# Patient Record
Sex: Male | Born: 1935 | Race: White | Hispanic: No | Marital: Married | Smoking: Former smoker
Health system: Southern US, Community
[De-identification: ages and names within clinical notes are randomized; demographics above are authoritative.]

## PROBLEM LIST (undated history)

## (undated) DIAGNOSIS — Z87898 Personal history of other specified conditions: Secondary | ICD-10-CM

## (undated) DIAGNOSIS — Z972 Presence of dental prosthetic device (complete) (partial): Secondary | ICD-10-CM

## (undated) DIAGNOSIS — K219 Gastro-esophageal reflux disease without esophagitis: Secondary | ICD-10-CM

## (undated) DIAGNOSIS — I219 Acute myocardial infarction, unspecified: Secondary | ICD-10-CM

## (undated) DIAGNOSIS — E039 Hypothyroidism, unspecified: Secondary | ICD-10-CM

## (undated) DIAGNOSIS — F329 Major depressive disorder, single episode, unspecified: Secondary | ICD-10-CM

## (undated) DIAGNOSIS — R0602 Shortness of breath: Secondary | ICD-10-CM

## (undated) DIAGNOSIS — M1990 Unspecified osteoarthritis, unspecified site: Secondary | ICD-10-CM

## (undated) DIAGNOSIS — I255 Ischemic cardiomyopathy: Secondary | ICD-10-CM

## (undated) DIAGNOSIS — Z8719 Personal history of other diseases of the digestive system: Secondary | ICD-10-CM

## (undated) DIAGNOSIS — F419 Anxiety disorder, unspecified: Secondary | ICD-10-CM

## (undated) DIAGNOSIS — I2510 Atherosclerotic heart disease of native coronary artery without angina pectoris: Secondary | ICD-10-CM

## (undated) DIAGNOSIS — G4730 Sleep apnea, unspecified: Secondary | ICD-10-CM

## (undated) DIAGNOSIS — E7800 Pure hypercholesterolemia, unspecified: Secondary | ICD-10-CM

## (undated) DIAGNOSIS — D693 Immune thrombocytopenic purpura: Secondary | ICD-10-CM

## (undated) DIAGNOSIS — F32A Depression, unspecified: Secondary | ICD-10-CM

## (undated) DIAGNOSIS — Z974 Presence of external hearing-aid: Secondary | ICD-10-CM

## (undated) DIAGNOSIS — I350 Nonrheumatic aortic (valve) stenosis: Secondary | ICD-10-CM

## (undated) DIAGNOSIS — I10 Essential (primary) hypertension: Secondary | ICD-10-CM

## (undated) DIAGNOSIS — M069 Rheumatoid arthritis, unspecified: Secondary | ICD-10-CM

## (undated) DIAGNOSIS — J189 Pneumonia, unspecified organism: Secondary | ICD-10-CM

## (undated) DIAGNOSIS — Z973 Presence of spectacles and contact lenses: Secondary | ICD-10-CM

## (undated) HISTORY — PX: STERNAL CLOSURE: SHX6203

## (undated) HISTORY — PX: HERNIA REPAIR: SHX51

## (undated) HISTORY — PX: LUMBAR DISC SURGERY: SHX700

## (undated) HISTORY — PX: COLONOSCOPY: SHX174

## (undated) HISTORY — PX: CATARACT EXTRACTION, BILATERAL: SHX1313

## (undated) HISTORY — PX: CORONARY ANGIOPLASTY WITH STENT PLACEMENT: SHX49

## (undated) HISTORY — PX: APPENDECTOMY: SHX54

## (undated) HISTORY — PX: BACK SURGERY: SHX140

## (undated) HISTORY — PX: TOTAL HIP ARTHROPLASTY: SHX124

## (undated) HISTORY — PX: HIATAL HERNIA REPAIR: SHX195

## (undated) HISTORY — PX: MULTIPLE TOOTH EXTRACTIONS: SHX2053

---

## 2007-09-07 ENCOUNTER — Encounter: Payer: Self-pay | Admitting: Orthopedic Surgery

## 2007-11-15 ENCOUNTER — Ambulatory Visit: Payer: Self-pay | Admitting: Orthopedic Surgery

## 2007-11-15 DIAGNOSIS — M479 Spondylosis, unspecified: Secondary | ICD-10-CM | POA: Insufficient documentation

## 2007-11-15 DIAGNOSIS — M25559 Pain in unspecified hip: Secondary | ICD-10-CM | POA: Insufficient documentation

## 2007-11-17 ENCOUNTER — Ambulatory Visit (HOSPITAL_COMMUNITY): Admission: RE | Admit: 2007-11-17 | Discharge: 2007-11-17 | Payer: Self-pay | Admitting: Orthopedic Surgery

## 2007-11-25 ENCOUNTER — Telehealth: Payer: Self-pay | Admitting: Orthopedic Surgery

## 2007-12-02 ENCOUNTER — Encounter: Payer: Self-pay | Admitting: Orthopedic Surgery

## 2008-09-05 ENCOUNTER — Ambulatory Visit (HOSPITAL_COMMUNITY): Admission: RE | Admit: 2008-09-05 | Discharge: 2008-09-05 | Payer: Self-pay | Admitting: Surgery

## 2009-09-02 ENCOUNTER — Emergency Department (HOSPITAL_COMMUNITY): Admission: EM | Admit: 2009-09-02 | Discharge: 2009-09-02 | Payer: Self-pay | Admitting: Emergency Medicine

## 2009-09-03 ENCOUNTER — Emergency Department (HOSPITAL_COMMUNITY): Admission: EM | Admit: 2009-09-03 | Discharge: 2009-09-03 | Payer: Self-pay | Admitting: Emergency Medicine

## 2009-12-22 DIAGNOSIS — I219 Acute myocardial infarction, unspecified: Secondary | ICD-10-CM

## 2009-12-22 HISTORY — PX: CORONARY ARTERY BYPASS GRAFT: SHX141

## 2009-12-22 HISTORY — DX: Acute myocardial infarction, unspecified: I21.9

## 2011-03-28 LAB — CBC
HCT: 39.8 % (ref 39.0–52.0)
MCHC: 34.7 g/dL (ref 30.0–36.0)
MCV: 91.6 fL (ref 78.0–100.0)
Platelets: 230 10*3/uL (ref 150–400)
RBC: 4.34 MIL/uL (ref 4.22–5.81)
RDW: 13 % (ref 11.5–15.5)

## 2011-03-28 LAB — DIFFERENTIAL
Basophils Relative: 0 % (ref 0–1)
Eosinophils Absolute: 0.2 10*3/uL (ref 0.0–0.7)
Eosinophils Relative: 1 % (ref 0–5)
Lymphocytes Relative: 22 % (ref 12–46)
Monocytes Absolute: 1.6 10*3/uL — ABNORMAL HIGH (ref 0.1–1.0)
Monocytes Relative: 14 % — ABNORMAL HIGH (ref 3–12)
Neutrophils Relative %: 63 % (ref 43–77)

## 2011-03-28 LAB — BASIC METABOLIC PANEL
BUN: 13 mg/dL (ref 6–23)
Calcium: 9.2 mg/dL (ref 8.4–10.5)
Creatinine, Ser: 0.94 mg/dL (ref 0.4–1.5)
GFR calc Af Amer: >60 mL/min (ref >60)

## 2011-09-22 LAB — CREATININE, SERUM: Creatinine, Ser: 0.95

## 2012-09-16 ENCOUNTER — Emergency Department (HOSPITAL_COMMUNITY): Payer: Medicare Other

## 2012-09-16 ENCOUNTER — Emergency Department (HOSPITAL_COMMUNITY)
Admission: EM | Admit: 2012-09-16 | Discharge: 2012-09-16 | Disposition: A | Discharge status: Home / Self Care | Payer: Medicare Other | Attending: Emergency Medicine | Admitting: Emergency Medicine

## 2012-09-16 ENCOUNTER — Encounter (HOSPITAL_COMMUNITY): Payer: Self-pay

## 2012-09-16 DIAGNOSIS — E079 Disorder of thyroid, unspecified: Secondary | ICD-10-CM | POA: Insufficient documentation

## 2012-09-16 DIAGNOSIS — IMO0002 Reserved for concepts with insufficient information to code with codable children: Secondary | ICD-10-CM | POA: Insufficient documentation

## 2012-09-16 DIAGNOSIS — M1710 Unilateral primary osteoarthritis, unspecified knee: Secondary | ICD-10-CM | POA: Insufficient documentation

## 2012-09-16 DIAGNOSIS — Z87891 Personal history of nicotine dependence: Secondary | ICD-10-CM | POA: Insufficient documentation

## 2012-09-16 DIAGNOSIS — Z9861 Coronary angioplasty status: Secondary | ICD-10-CM | POA: Insufficient documentation

## 2012-09-16 DIAGNOSIS — Z951 Presence of aortocoronary bypass graft: Secondary | ICD-10-CM | POA: Insufficient documentation

## 2012-09-16 DIAGNOSIS — K219 Gastro-esophageal reflux disease without esophagitis: Secondary | ICD-10-CM | POA: Insufficient documentation

## 2012-09-16 DIAGNOSIS — Z23 Encounter for immunization: Secondary | ICD-10-CM | POA: Insufficient documentation

## 2012-09-16 DIAGNOSIS — E7800 Pure hypercholesterolemia, unspecified: Secondary | ICD-10-CM | POA: Insufficient documentation

## 2012-09-16 DIAGNOSIS — F411 Generalized anxiety disorder: Secondary | ICD-10-CM | POA: Insufficient documentation

## 2012-09-16 DIAGNOSIS — S80219A Abrasion, unspecified knee, initial encounter: Secondary | ICD-10-CM

## 2012-09-16 DIAGNOSIS — I10 Essential (primary) hypertension: Secondary | ICD-10-CM | POA: Insufficient documentation

## 2012-09-16 HISTORY — DX: Pure hypercholesterolemia, unspecified: E78.00

## 2012-09-16 HISTORY — DX: Essential (primary) hypertension: I10

## 2012-09-16 HISTORY — DX: Anxiety disorder, unspecified: F41.9

## 2012-09-16 HISTORY — DX: Gastro-esophageal reflux disease without esophagitis: K21.9

## 2012-09-16 MED ORDER — TETANUS-DIPHTH-ACELL PERTUSSIS 5-2.5-18.5 LF-MCG/0.5 IM SUSP
0.5000 mL | Freq: Once | INTRAMUSCULAR | Status: AC
  Administered 2012-09-16: 0.5 mL via INTRAMUSCULAR
  Filled 2012-09-16: qty 0.5

## 2012-09-16 NOTE — ED Notes (Signed)
Hit my left knee with a chain saw per pt. Happened around 3 pm and I thought I had it under control but my wife and son thought I didn't do to good of a job per pt.

## 2012-09-16 NOTE — ED Notes (Signed)
MD at bedside. 

## 2012-09-16 NOTE — ED Notes (Signed)
Discharge instructions reviewed with pt, questions answered. Pt verbalized understanding.  

## 2012-09-16 NOTE — ED Provider Notes (Signed)
History   This chart was scribed for Flint Melter, MD by Albertha Ghee Rifaie. This patient was seen in room APA06/APA06 and the patient's care was started at 21:48.   CSN: [id number]  Arrival date & time 09/16/12  1914   First MD Initiated Contact with Patient 09/16/12 2148      Chief Complaint  Patient presents with  . Extremity Laceration  . Knee Injury    The history is provided by the patient and the spouse. No language interpreter was used.    CASIUS MELCHIOR is a 78 y.o. male who presents to the Emergency Department complaining of 7 hr of left knee injury after he hit it, blood is controled. Pt was able to ambulate after the accident and came to the ED because his wife and son were worried. Pt denies having fever ,chills, emesis, and nausea. Pt denies smoking and alcohol use.    Past Medical History  Diagnosis Date  . High cholesterol   . Thyroid disease   . GERD (gastroesophageal reflux disease)   . Hypertension   . Anxiety   . History of heart artery stent     Past Surgical History  Procedure Date  . Coronary artery bypass graft     History reviewed. No pertinent family history.  History  Substance Use Topics  . Smoking status: Former Games developer  . Smokeless tobacco: Not on file  . Alcohol Use: No      Review of Systems  All other systems reviewed and are negative.    Allergies  Lipitor and Methylprednisolone  Home Medications   Current Outpatient Rx  Name Route Sig Dispense Refill  . ALPRAZOLAM 0.5 MG PO TABS Oral Take 0.5 mg by mouth at bedtime as needed.    . ASPIRIN 81 MG PO CHEW Oral Chew 81 mg by mouth daily.    [name] Kitchen CLOPIDOGREL BISULFATE 75 MG PO TABS Oral Take 75 mg by mouth daily.    [name] Kitchen DICLOFENAC SODIUM 50 MG PO TBEC Oral Take 100 mg by mouth 2 (two) times daily.    [name] Kitchen FLUOXETINE HCL 10 MG PO CAPS Oral Take 10 mg by mouth daily.    [name] Kitchen FLUTICASONE PROPIONATE  HFA 110 MCG/ACT IN AERO Inhalation Inhale 1 puff into the lungs 2 (two) times daily.     [name] Kitchen FLUTICASONE-SALMETEROL 230-21 MCG/ACT IN AERO Inhalation Inhale 2 puffs into the lungs 2 (two) times daily.    [name] Kitchen LEVOTHYROXINE SODIUM 200 MCG PO TABS Oral Take 200 mcg by mouth daily.    [name] Kitchen LEVOTHYROXINE SODIUM 25 MCG PO TABS Oral Take 25 mcg by mouth daily.    [name] Kitchen METOPROLOL SUCCINATE ER 25 MG PO TB24 Oral Take 25 mg by mouth daily.    [name] Kitchen OMEPRAZOLE 40 MG PO CPDR Oral Take 40 mg by mouth daily.    [name] Kitchen PRAVASTATIN SODIUM 20 MG PO TABS Oral Take 20 mg by mouth daily.      BP 146/73  Pulse 66  Temp 98.3 F (36.8 C) (Oral)  Resp 20  Ht 6\' 2"  (1.88 m)  Wt 235 lb (106.595 kg)  BMI 30.17 kg/m2  SpO2 96%  Physical Exam  Nursing note and vitals reviewed. Constitutional: He is oriented to person, place, and time. He appears well-developed and well-nourished. No distress.  HENT:  Head: Normocephalic and atraumatic.  Eyes: Conjunctivae normal and EOM are normal.  Neck: Neck supple. No tracheal deviation present.  Cardiovascular: Normal rate.   Pulmonary/Chest: Effort normal. No respiratory distress.  Abdominal: He exhibits no distension.  Musculoskeletal:       Abrasion in left anterior knee, not bleeding Left knee diffusion and limited ROM  Left knee arthritis   Neurological: He is alert and oriented to person, place, and time. No sensory deficit.  Skin: Skin is dry.  Psychiatric: He has a normal mood and affect. His behavior is normal.    ED Course  Procedures (including critical care time)  DIAGNOSTIC STUDIES: Oxygen Saturation is 96% on room air, adequate by my interpretation.    COORDINATION OF CARE: 9:48 PM Discussed treatment plan with pt at bedside and pt agreed to plan. 10:12 PM wound was cleaned and no bleeding, bandage was applied and discharge plans were discussed with pt at bedside and pt agreed to plan.   Labs Reviewed - No data to display Dg Knee Complete 4 Views Left  09/16/2012  *RADIOLOGY REPORT*  Clinical Data: 78 year old male chainsaw injury to the knee.   Pain.  LEFT KNEE - COMPLETE 4+ VIEW  Comparison: None.  Findings: The soft tissue injury is not clearly delineated.  There is a small suprapatellar joint effusion.  No definite gas within the joint space.  The patella appears intact.  Medial compartment joint space loss and mild tricompartmental degenerative changes. No acute fracture identified.  Small surgical clips posterior to the knee and calcified atherosclerosis in the left lower extremity.  IMPRESSION: 1.  Joint effusion.  No definite gas within the joint space. 2.  No acute fracture identified.   Original Report Authenticated By: Ulla Potash III, M.D.      1. Abrasion of knee       MDM  Minor knee abrasion. Unassociated chronic left knee pain/swelling is likely d/t DJD. No indication for further ED treatment.     I personally performed the services described in this documentation, which was scribed in my presence. The recorded information has been reviewed and considered.    Plan: Home Medications- usual; Home Treatments- wounc care; Recommended follow up- PCP, prn   Flint Melter, MD 09/18/12 1223

## 2012-10-11 ENCOUNTER — Ambulatory Visit (INDEPENDENT_AMBULATORY_CARE_PROVIDER_SITE_OTHER): Payer: Medicare Other | Admitting: Orthopedic Surgery

## 2012-10-11 ENCOUNTER — Encounter: Payer: Self-pay | Admitting: Orthopedic Surgery

## 2012-10-11 VITALS — BP 102/60 | Ht 74.0 in | Wt 235.0 lb

## 2012-10-11 DIAGNOSIS — M25462 Effusion, left knee: Secondary | ICD-10-CM | POA: Insufficient documentation

## 2012-10-11 DIAGNOSIS — M179 Osteoarthritis of knee, unspecified: Secondary | ICD-10-CM

## 2012-10-11 DIAGNOSIS — M1710 Unilateral primary osteoarthritis, unspecified knee: Secondary | ICD-10-CM

## 2012-10-11 DIAGNOSIS — M25469 Effusion, unspecified knee: Secondary | ICD-10-CM

## 2012-10-11 NOTE — Progress Notes (Signed)
Patient ID: BRIGHT SPIELMANN, male   DOB: Nov 28, 1934, 78 y.o.   MRN: [id number] Chief Complaint  Patient presents with  . Knee Injury    left knee injury, DOI 09/16/12     78 year old male who 2 month history of gradual onset of throbbing 7/10 intermittent pain which is worse when he moves and straightens his knee especially with flexion. He is experiencing catching and medial and lateral joint pain  No trauma no previous treatment other than over-the-counter medication  Review of systems watering of the eyes. Shortness of breath and cough snoring easy bruising.  Remaining review of systems negative    BP 102/60  Ht 6\' 2"  (1.88 m)  Wt 235 lb (106.595 kg)  BMI 30.17 kg/m2  Vital signs are stable as recorded  General appearance is normal  The patient is alert and oriented x3  The patient's mood and affect are normal  Gait assessment: No assistive devices needed for ambulation The cardiovascular exam reveals normal pulses and temperature without edema swelling.  The lymphatic system is negative for palpable lymph nodes  The sensory exam is normal.  There are no pathologic reflexes.  Balance is normal.   Exam of the upper extremities normal Inspection left lower [place]ley tenderness over the lateral femoral condyle and medial joint line Range of motion he can flex the knee 125 Stability the knee is stable Strength strength is normal Skin skin is intact, laceration now closed with scab  X-rays were done at the hospital for a chain saw injury which was superficial, that x-rays show severe medial joint space narrowing subchondral sclerosis mild varus alignment to the knee  Joint effusion  Osteoarthritis of the left knee  Recommend aspiration injection  Aspiration, we obtained 70 cc of clear yellow fluid  Followup 2 weeks  Knee  Injection and aspiration Procedure Note  Pre-operative Diagnosis: left knee oa, effusion  Post-operative Diagnosis: same  Indications:  pain, swelling  Anesthesia: ethyl chloride   Procedure Details   Verbal consent was obtained for the procedure. Time out was completed.The joint was prepped with alcohol, followed by  Ethyl chloride spray and The 18-gauge needle was inserted into the joint via lateral approach and we aspirated approximately 75 cc of clear yellow fluid  This was followed by the injection of 4ml 1% lidocaine and 1 ml of depomedrol  was then injected into the joint . The needle was removed and the area cleansed and dressed.  Complications:  None; patient tolerated the procedure well.

## 2012-10-11 NOTE — Patient Instructions (Addendum)
You have received a steroid shot. 15% of patients experience increased pain at the injection site with in the next 24 hours. This is best treated with ice and tylenol extra strength 2 tabs every 8 hours. If you are still having pain please call the office.   Joint Injection Care After Refer to this sheet in the next few days. These instructions provide you with information on caring for yourself after you have had a joint injection. Your caregiver also may give you more specific instructions. Your treatment has been planned according to current medical practices, but problems sometimes occur. Call your caregiver if you have any problems or questions after your procedure. After any type of joint injection, it is not uncommon to experience:  Soreness, swelling, or bruising around the injection site.   Mild numbness, tingling, or weakness around the injection site caused by the numbing medicine used before or with the injection.  It also is possible to experience the following effects associated with the specific agent after injection:  Iodine-based contrast agents:   Allergic reaction (itching, hives, widespread redness, and swelling beyond the injection site).   Corticosteroids (These effects are rare.):   Allergic reaction.   Increased blood sugar levels (If you have diabetes and you notice that your blood sugar levels have increased, notify your caregiver).   Increased blood pressure levels.   Mood swings.   Hyaluronic acid in the use of viscosupplementation.   Temporary heat or redness.   Temporary rash and itching.   Increased fluid accumulation in the injected joint.  These effects all should resolve within a day after your procedure.   HOME CARE INSTRUCTIONS  Limit yourself to light activity the day of your procedure. Avoid lifting heavy objects, bending, stooping, or twisting.   Take prescription or over-the-counter pain medication as directed by your caregiver.   You  may apply ice to your injection site to reduce pain and swelling the day of your procedure. Ice may be applied 3 to 4 times:   Put ice in a plastic bag.   Place a towel between your skin and the bag.   Leave the ice on for no longer than 15 to 20 minutes each time.  SEEK IMMEDIATE MEDICAL CARE IF:    Pain and swelling get worse rather than better or extend beyond the injection site.   Numbness does not go away.   Blood or fluid continues to leak from the injection site.   You have chest pain.   You have swelling of your face or tongue.   You have trouble breathing or you become dizzy.   You develop a fever, chills, or severe tenderness at the injection site that last longer than 1 day.  MAKE SURE YOU:  Understand these instructions.   Watch your condition.   Get help right away if you are not doing well or if you get worse.  Document Released: 08/21/2011 Document Revised: 03/01/2012 Document Reviewed: 08/21/2011 [facility] Patient Information 2013 [place], [place].   Degenerative Arthritis You have osteoarthritis. This is the wear and tear arthritis that comes with aging. It is also called degenerative arthritis. This is common in people past middle age. It is caused by stress on the joints. The large weight bearing joints of the lower extremities are most often affected. The knees, hips, back, neck, and hands can become painful, swollen, and stiff. This is the most common type of arthritis. It comes on with age, carrying too much weight, or from an  injury. Treatment includes resting the sore joint until the pain and swelling improve. Crutches or a walker may be needed for severe flares. Only take over-the-counter or prescription medicines for pain, discomfort, or fever as directed by your caregiver. Local heat therapy may improve motion. Cortisone shots into the joint are sometimes used to reduce pain and swelling during flares. Osteoarthritis is usually not crippling and progresses  slowly. There are things you can do to decrease pain:  Avoid high impact activities.   Exercise regularly.   Low impact exercises such as walking, biking and swimming help to keep the muscles strong and keep normal joint function.   Stretching helps to keep your range of motion.   Lose weight if you are overweight. This reduces joint stress.  In severe cases when you have pain at rest or increasing disability, joint surgery may be helpful. See your caregiver for follow-up treatment as recommended.   SEEK IMMEDIATE MEDICAL CARE IF:    You have severe joint pain.   Marked swelling and redness in your joint develops.   You develop a high fever.  Document Released: 12/08/2005 Document Revised: 03/01/2012 Document Reviewed: 05/10/2007 [facility] Patient Information 2013 [place], [place].

## 2012-10-25 ENCOUNTER — Ambulatory Visit (INDEPENDENT_AMBULATORY_CARE_PROVIDER_SITE_OTHER): Payer: Medicare Other | Admitting: Orthopedic Surgery

## 2012-10-25 ENCOUNTER — Encounter: Payer: Self-pay | Admitting: Orthopedic Surgery

## 2012-10-25 VITALS — Ht 74.0 in | Wt 235.0 lb

## 2012-10-25 DIAGNOSIS — IMO0002 Reserved for concepts with insufficient information to code with codable children: Secondary | ICD-10-CM

## 2012-10-25 DIAGNOSIS — M25462 Effusion, left knee: Secondary | ICD-10-CM

## 2012-10-25 DIAGNOSIS — M1710 Unilateral primary osteoarthritis, unspecified knee: Secondary | ICD-10-CM

## 2012-10-25 DIAGNOSIS — M25469 Effusion, unspecified knee: Secondary | ICD-10-CM

## 2012-10-25 NOTE — Patient Instructions (Signed)
activities as tolerated 

## 2012-10-25 NOTE — Progress Notes (Signed)
Patient ID: Jeffrey [name], male   DOB: 26-Sep-1934, 78 y.o.   MRN: [id number] Chief Complaint  Patient presents with  . Follow-up    2 week recheck on left knee after aspiration.   1. Effusion of knee joint, left   2. OA (osteoarthritis) of knee     Improved post aspiration   Exam:  Swelling has gone down   OA left knee   F/u prn

## 2012-11-10 ENCOUNTER — Encounter: Payer: Self-pay | Admitting: Orthopedic Surgery

## 2012-11-10 ENCOUNTER — Ambulatory Visit (INDEPENDENT_AMBULATORY_CARE_PROVIDER_SITE_OTHER): Payer: Medicare Other | Admitting: Orthopedic Surgery

## 2012-11-10 VITALS — Ht 74.0 in | Wt 235.0 lb

## 2012-11-10 DIAGNOSIS — IMO0002 Reserved for concepts with insufficient information to code with codable children: Secondary | ICD-10-CM

## 2012-11-10 DIAGNOSIS — M1710 Unilateral primary osteoarthritis, unspecified knee: Secondary | ICD-10-CM

## 2012-11-10 DIAGNOSIS — M25462 Effusion, left knee: Secondary | ICD-10-CM

## 2012-11-10 DIAGNOSIS — M25469 Effusion, unspecified knee: Secondary | ICD-10-CM

## 2012-11-10 NOTE — Patient Instructions (Addendum)
Apply ice as needed for 24-48 hrs   Call us if severe pain   Take 2 xs tylenol for pain if needed

## 2012-11-10 NOTE — Progress Notes (Signed)
Patient ID: Jeffrey [name], male   DOB: [birth year], 78 y.o.   MRN: [id number] Chief Complaint  Patient presents with  . Follow-up    Recheck on left knee.    1. OA (osteoarthritis) of knee   2. Effusion of knee joint, left     Recheck left knee patient complains of pain and swelling. No new symptoms. Swelling came back. Did well with injection last time.  Ht 6\' 2"  (1.88 m)  Wt 235 lb (106.595 kg)  BMI 30.17 kg/m2  Left knee joint effusion moderate. Flexion limited by effusion.  Aspirated 55 cc of clear yellow fluid  Injected 40 mg of Depo-Medrol and 3 cc 1% lidocaine  Knee  Injection and aspiration Procedure Note  Pre-operative Diagnosis: left knee oa, effusion  Post-operative Diagnosis: same  Indications: pain, swelling  Anesthesia: ethyl chloride   Procedure Details   Verbal consent was obtained for the procedure. Time out was completed.The joint was prepped with alcohol, followed by  Ethyl chloride spray and The 18-gauge needle was inserted into the joint via lateral approach and we aspirated approximately 55 cc of clear yellow fluid  This was followed by the injection of 4ml 1% lidocaine and 1 ml of depomedrol  was then injected into the joint . The needle was removed and the area cleansed and dressed.  Complications:  None; patient tolerated the procedure well.

## 2012-12-09 ENCOUNTER — Ambulatory Visit (INDEPENDENT_AMBULATORY_CARE_PROVIDER_SITE_OTHER): Payer: Medicare Other | Admitting: Orthopedic Surgery

## 2012-12-09 VITALS — BP 140/82 | Ht 74.0 in | Wt 235.0 lb

## 2012-12-09 DIAGNOSIS — M25469 Effusion, unspecified knee: Secondary | ICD-10-CM

## 2012-12-09 DIAGNOSIS — M1710 Unilateral primary osteoarthritis, unspecified knee: Secondary | ICD-10-CM

## 2012-12-09 DIAGNOSIS — IMO0002 Reserved for concepts with insufficient information to code with codable children: Secondary | ICD-10-CM

## 2012-12-09 DIAGNOSIS — M25462 Effusion, left knee: Secondary | ICD-10-CM

## 2012-12-09 NOTE — Patient Instructions (Addendum)
You have received a steroid shot. 15% of patients experience increased pain at the injection site with in the next 24 hours. This is best treated with ice and tylenol extra strength 2 tabs every 8 hours. If you are still having pain please call the office.   Apply ice as needed to control swelling

## 2012-12-09 NOTE — Progress Notes (Signed)
Patient ID: Jeffrey [name], male   DOB: [birth year], 79 y.o.   MRN: [id number] Chief Complaint  Patient presents with  . Follow-up    recheck left knee    Wants fluid drawn off   Knee  Injection and aspiration Procedure Note  Pre-operative Diagnosis: left knee oa, effusion  Post-operative Diagnosis: same  Indications: pain, swelling  Anesthesia: ethyl chloride   Procedure Details   Verbal consent was obtained for the procedure. Time out was completed.The joint was prepped with alcohol, followed by  Ethyl chloride spray and The 18-gauge needle was inserted into the joint via lateral approach and we aspirated approximately 70 cc of clear yellow fluid  This was followed by the injection of 4ml 1% lidocaine and 1 ml of depomedrol  was then injected into the joint . The needle was removed and the area cleansed and dressed.  Complications:  None; patient tolerated the procedure well.

## 2012-12-22 DIAGNOSIS — Z87898 Personal history of other specified conditions: Secondary | ICD-10-CM

## 2012-12-22 HISTORY — DX: Personal history of other specified conditions: Z87.898

## 2012-12-23 ENCOUNTER — Ambulatory Visit: Payer: Medicare Other | Admitting: Orthopedic Surgery

## 2013-01-05 ENCOUNTER — Ambulatory Visit (INDEPENDENT_AMBULATORY_CARE_PROVIDER_SITE_OTHER): Payer: Medicare Other | Admitting: Orthopedic Surgery

## 2013-01-05 VITALS — BP 134/82

## 2013-01-05 DIAGNOSIS — M25462 Effusion, left knee: Secondary | ICD-10-CM

## 2013-01-05 DIAGNOSIS — M25469 Effusion, unspecified knee: Secondary | ICD-10-CM

## 2013-01-05 NOTE — Patient Instructions (Signed)
You have received a steroid shot. 15% of patients experience increased pain at the injection site with in the next 24 hours. This is best treated with ice and tylenol extra strength 2 tabs every 8 hours. If you are still having pain please call the office.    

## 2013-01-05 NOTE — Progress Notes (Signed)
Patient ID: Jeffrey [name], male   DOB: 1934/08/31, 79 y.o.   MRN: [id number] Chief Complaint  Patient presents with  . Follow-up    Left knee swelling    Knee  Injection and aspiration Procedure Note  Pre-operative Diagnosis: left knee oa, effusion  Post-operative Diagnosis: same  Indications: pain, swelling  Anesthesia: ethyl chloride   Procedure Details   Verbal consent was obtained for the procedure. Time out was completed.The joint was prepped with alcohol, followed by  Ethyl chloride spray and The 18-gauge needle was inserted into the joint via lateral approach and we aspirated approximately 60 cc of clear yellow fluid  This was followed by the injection of 4ml 1% lidocaine and 1 ml of depomedrol  was then injected into the joint . The needle was removed and the area cleansed and dressed.  Complications:  None; patient tolerated the procedure well.

## 2013-03-22 ENCOUNTER — Ambulatory Visit (INDEPENDENT_AMBULATORY_CARE_PROVIDER_SITE_OTHER): Payer: Medicare Other | Admitting: Orthopedic Surgery

## 2013-03-22 VITALS — BP 124/74 | Ht 74.0 in | Wt 235.0 lb

## 2013-03-22 DIAGNOSIS — M25469 Effusion, unspecified knee: Secondary | ICD-10-CM

## 2013-03-22 DIAGNOSIS — M25462 Effusion, left knee: Secondary | ICD-10-CM

## 2013-03-22 NOTE — Patient Instructions (Signed)
You have received a steroid shot. 15% of patients experience increased pain at the injection site with in the next 24 hours. This is best treated with ice and tylenol extra strength 2 tabs every 8 hours. If you are still having pain please call the office.    

## 2013-03-22 NOTE — Progress Notes (Signed)
Patient ID: Jeffrey [name], male   DOB: 04-05-1934, 79 y.o.   MRN: [id number] Chief Complaint  Patient presents with  . Joint Swelling    Left knee swelling request fluid to be drawn off    Knee  Injection and aspiration Procedure Note  Pre-operative Diagnosis: left knee oa, effusion  Post-operative Diagnosis: same  Indications: pain, swelling  Anesthesia: ethyl chloride   Procedure Details   Verbal consent was obtained for the procedure. Time out was completed.The joint was prepped with alcohol, followed by  Ethyl chloride spray and The 18-gauge needle was inserted into the joint via lateral approach and we aspirated approximately 60cc of clear yellow fluid  This was followed by the injection of 4ml 1% lidocaine and 1 ml of depomedrol  was then injected into the joint . The needle was removed and the area cleansed and dressed.  Complications:  None; patient tolerated the procedure well.

## 2013-04-19 ENCOUNTER — Ambulatory Visit (INDEPENDENT_AMBULATORY_CARE_PROVIDER_SITE_OTHER): Payer: Medicare Other | Admitting: Orthopedic Surgery

## 2013-04-19 VITALS — BP 118/62 | Ht 74.0 in | Wt 235.0 lb

## 2013-04-19 DIAGNOSIS — M25469 Effusion, unspecified knee: Secondary | ICD-10-CM

## 2013-04-19 DIAGNOSIS — M25462 Effusion, left knee: Secondary | ICD-10-CM

## 2013-04-19 NOTE — Patient Instructions (Signed)
Knee Injection  Joint injections are shots. Your caregiver will place a needle into your knee joint. The needle is used to put medicine into the joint. These shots can be used to help treat different painful knee conditions such as osteoarthritis, bursitis, local flare-ups of rheumatoid arthritis, and pseudogout. Anti-inflammatory medicines such as corticosteroids and anesthetics are the most common medicines used for joint and soft tissue injections.   PROCEDURE  · The skin over the kneecap will be cleaned with an antiseptic solution.  · Your caregiver will inject a small amount of a local anesthetic (a medicine like Novocaine) just under the skin in the area that was cleaned.  · After the area becomes numb, a second injection is done. This second injection usually includes an anesthetic and an anti-inflammatory medicine called a steroid or cortisone. The needle is carefully placed in between the kneecap and the knee, and the medicine is injected into the joint space.  · After the injection is done, the needle is removed. Your caregiver may place a bandage over the injection site. The whole procedure takes no more than a couple of minutes.  BEFORE THE PROCEDURE   Wash all of the skin around the entire knee area. Try to remove any loose, scaling skin. There is no other specific preparation necessary unless advised otherwise by your caregiver.  LET YOUR CAREGIVER KNOW ABOUT:   · Allergies.  · Medications taken including herbs, eye drops, over the counter medications, and creams.  · Use of steroids (by mouth or creams).  · Possible pregnancy, if applicable.  · Previous problems with anesthetics or Novocaine.  · History of blood clots (thrombophlebitis).  · History of bleeding or blood problems.  · Previous surgery.  · Other health problems.  RISKS AND COMPLICATIONS  Side effects from cortisone shots are rare. They include:   · Slight bruising of the skin.  · Shrinkage of the normal fatty tissue under the skin where  the shot was given.  · Increase in pain after the shot.  · Infection.  · Weakening of tendons or tendon rupture.  · Allergic reaction to the medicine.  · Diabetics may have a temporary increase in their blood sugar after a shot.  · Cortisone can temporarily weaken the immune system. While receiving these shots, you should not get certain vaccines. Also, avoid contact with anyone who has chickenpox or measles. Especially if you have never had these diseases or have not been previously immunized. Your immune system may not be strong enough to fight off the infection while the cortisone is in your system.  AFTER THE PROCEDURE   · You can go home after the procedure.  · You may need to put ice on the joint 15 to 20 minutes every 3 or 4 hours until the pain goes away.  · You may need to put an elastic bandage on the joint.  HOME CARE INSTRUCTIONS   · Only take over-the-counter or prescription medicines for pain, discomfort, or fever as directed by your caregiver.  · You should avoid stressing the joint. Unless advised otherwise, avoid activities that put a lot of pressure on a knee joint, such as:  · Jogging.  · Bicycling.  · Recreational climbing.  · Hiking.  · Laying down and elevating the leg/knee above the level of your heart can help to minimize swelling.  SEEK MEDICAL CARE IF:   · You have repeated or worsening swelling.  · There is drainage from the puncture area.  ·   You develop red streaking that extends above or below the site where the needle was inserted.  SEEK IMMEDIATE MEDICAL CARE IF:   · You develop a fever.  · You have pain that gets worse even though you are taking pain medicine.  · The area is red and warm, and you have trouble moving the joint.  MAKE SURE YOU:   · Understand these instructions.  · Will watch your condition.  · Will get help right away if you are not doing well or get worse.  Document Released: 03/01/2007 Document Revised: 03/01/2012 Document Reviewed: 11/26/2007  [employer]® Patient  Information ©2013 [employer].

## 2013-04-19 NOTE — Progress Notes (Signed)
Patient ID: Jeffrey [name], male   DOB: 09/26/1934, 79 y.o.   MRN: [id number] Request injection left knee for chronic effusion  Patient opted not to have surgery  Large joint effusion  Knee  Injection and aspiration Procedure Note  Pre-operative Diagnosis: left knee oa, effusion  Post-operative Diagnosis: same  Indications: pain, swelling  Anesthesia: ethyl chloride   Procedure Details   Verbal consent was obtained for the procedure. Time out was completed.The joint was prepped with alcohol, followed by  Ethyl chloride spray and The 18-gauge needle was inserted into the joint via lateral approach and we aspirated approximately 60 cc of clear yellow fluid  This was followed by the injection of 4ml 1% lidocaine and 1 ml of depomedrol  was then injected into the joint . The needle was removed and the area cleansed and dressed.  Complications:  None; patient tolerated the procedure well.

## 2013-05-24 ENCOUNTER — Ambulatory Visit (INDEPENDENT_AMBULATORY_CARE_PROVIDER_SITE_OTHER): Payer: Medicare Other | Admitting: Orthopedic Surgery

## 2013-05-24 VITALS — BP 112/60 | Ht 74.0 in | Wt 235.0 lb

## 2013-05-24 DIAGNOSIS — M25462 Effusion, left knee: Secondary | ICD-10-CM

## 2013-05-24 DIAGNOSIS — IMO0002 Reserved for concepts with insufficient information to code with codable children: Secondary | ICD-10-CM

## 2013-05-24 DIAGNOSIS — M1710 Unilateral primary osteoarthritis, unspecified knee: Secondary | ICD-10-CM

## 2013-05-24 DIAGNOSIS — M25469 Effusion, unspecified knee: Secondary | ICD-10-CM

## 2013-05-24 NOTE — Progress Notes (Signed)
Patient ID: Jeffrey [name], male   DOB: [birth year], 79 y.o.   MRN: [id number] Chief Complaint  Patient presents with  . Follow-up    swelling left knee, request aspiration    Knee  Injection and aspiration Procedure Note  Pre-operative Diagnosis: left knee oa, effusion  Post-operative Diagnosis: same  Indications: pain, swelling  Anesthesia: ethyl chloride   Procedure Details   Verbal consent was obtained for the procedure. Time out was completed.The joint was prepped with alcohol, followed by  Ethyl chloride spray and The 18-gauge needle was inserted into the joint via lateral approach and we aspirated approximately 75 cc of clear yellow fluid  This was followed by the injection of 4ml 1% lidocaine and 1 ml of depomedrol  was then injected into the joint . The needle was removed and the area cleansed and dressed.  Complications:  None; patient tolerated the procedure well.

## 2013-05-31 ENCOUNTER — Ambulatory Visit: Payer: Medicare Other | Admitting: Orthopedic Surgery

## 2013-07-07 ENCOUNTER — Ambulatory Visit (INDEPENDENT_AMBULATORY_CARE_PROVIDER_SITE_OTHER): Payer: Medicare Other | Admitting: Orthopedic Surgery

## 2013-07-07 VITALS — BP 142/78 | Ht 74.0 in | Wt 235.0 lb

## 2013-07-07 DIAGNOSIS — M25462 Effusion, left knee: Secondary | ICD-10-CM

## 2013-07-07 DIAGNOSIS — M25469 Effusion, unspecified knee: Secondary | ICD-10-CM

## 2013-07-07 NOTE — Patient Instructions (Signed)
Normal activity    

## 2013-07-07 NOTE — Progress Notes (Signed)
Patient ID: Jeffrey [name], male   DOB: 1934/04/28, 79 y.o.   MRN: [id number] Chief Complaint  Patient presents with  . Follow-up    Recheck left knee swelling.    Follow-up        swelling left knee, request aspiration     Knee  Injection and aspiration Procedure Note  Pre-operative Diagnosis: left knee oa, effusion  Post-operative Diagnosis: same  Indications: pain, swelling  Anesthesia: ethyl chloride   Procedure Details   Verbal consent was obtained for the procedure. Time out was completed.The joint was prepped with alcohol, followed by  Ethyl chloride spray and The 18-gauge needle was inserted into the joint via lateral approach and we aspirated approximately 80 cc of clear yellow fluid  This was followed by the injection of 4ml 1% lidocaine and 1 ml of depomedrol  was then injected into the joint . The needle was removed and the area cleansed and dressed.  Complications:  None; patient tolerated the procedure well.

## 2013-09-22 ENCOUNTER — Encounter: Payer: Self-pay | Admitting: Orthopedic Surgery

## 2013-09-22 ENCOUNTER — Ambulatory Visit (INDEPENDENT_AMBULATORY_CARE_PROVIDER_SITE_OTHER): Payer: Medicare Other | Admitting: Orthopedic Surgery

## 2013-09-22 VITALS — BP 130/75 | Ht 74.0 in | Wt 235.0 lb

## 2013-09-22 DIAGNOSIS — M1710 Unilateral primary osteoarthritis, unspecified knee: Secondary | ICD-10-CM

## 2013-09-22 DIAGNOSIS — M25469 Effusion, unspecified knee: Secondary | ICD-10-CM

## 2013-09-22 DIAGNOSIS — M25462 Effusion, left knee: Secondary | ICD-10-CM

## 2013-09-22 NOTE — Progress Notes (Signed)
Patient ID: Jeffrey [name], male   DOB: 09-16-1934, 79 y.o.   MRN: [id number]  Chief Complaint  Patient presents with  . Follow-up    swelling left knee, request aspiration    Knee  Injection and aspiration Procedure Note  Pre-operative Diagnosis: left knee oa, effusion  Post-operative Diagnosis: same  Indications: pain, swelling  Anesthesia: ethyl chloride   Procedure Details   Verbal consent was obtained for the procedure. Time out was completed.The joint was prepped with alcohol, followed by  Ethyl chloride spray and The 18-gauge needle was inserted into the joint via lateral approach and we aspirated approximately 100 cc of clear yellow fluid  This was followed by the injection of 4ml 1% lidocaine and 1 ml of depomedrol  was then injected into the joint . The needle was removed and the area cleansed and dressed.  Complications:  None; patient tolerated the procedure well.

## 2013-09-22 NOTE — Patient Instructions (Addendum)
You have received a steroid shot. 15% of patients experience increased pain at the injection site with in the next 24 hours. This is best treated with ice and tylenol extra strength 2 tabs every 8 hours. If you are still having pain please call the office.    

## 2013-12-06 ENCOUNTER — Encounter: Payer: Self-pay | Admitting: Orthopedic Surgery

## 2013-12-06 ENCOUNTER — Ambulatory Visit (INDEPENDENT_AMBULATORY_CARE_PROVIDER_SITE_OTHER): Payer: Medicare Other | Admitting: Orthopedic Surgery

## 2013-12-06 VITALS — BP 144/80 | Ht 74.0 in | Wt 235.0 lb

## 2013-12-06 DIAGNOSIS — M1710 Unilateral primary osteoarthritis, unspecified knee: Secondary | ICD-10-CM

## 2013-12-06 DIAGNOSIS — M25462 Effusion, left knee: Secondary | ICD-10-CM

## 2013-12-06 DIAGNOSIS — M25469 Effusion, unspecified knee: Secondary | ICD-10-CM

## 2013-12-06 NOTE — Patient Instructions (Signed)
Come back as needed

## 2013-12-06 NOTE — Progress Notes (Signed)
Patient ID: Jeffrey [name], male   DOB: 04/16/1934, 79 y.o.   MRN: [id number]   Chief Complaint  Patient presents with  . Follow-up    Left knee pain and swelling request aspiration    BP 144/80  Ht 6\' 2"  (1.88 m)  Wt 235 lb (106.595 kg)  BMI 30.16 kg/m2  Aspiration left knee  Verbal consent  Time out  Lateral approach  Alcohol prep.  Ethyl chloride anesthesia  Needle was introduced through the lateral suprapatellar approach.  We aspirated 60cc of clear, yellow fluid.  This was followed by injection left knee.  Under sterile conditions the right knee was injected with Depomedrol 40 mg / ml (1 ml) and lidocaine 1% (4 ml)  There were no complications

## 2014-01-25 ENCOUNTER — Inpatient Hospital Stay (HOSPITAL_COMMUNITY)
Admission: AD | Admit: 2014-01-25 | Discharge: 2014-01-27 | DRG: 195 | Disposition: A | Discharge status: Home / Self Care | Payer: Medicare Other | Source: Other Acute Inpatient Hospital | Attending: Internal Medicine | Admitting: Internal Medicine

## 2014-01-25 DIAGNOSIS — F329 Major depressive disorder, single episode, unspecified: Secondary | ICD-10-CM | POA: Diagnosis present

## 2014-01-25 DIAGNOSIS — Z9861 Coronary angioplasty status: Secondary | ICD-10-CM

## 2014-01-25 DIAGNOSIS — I2510 Atherosclerotic heart disease of native coronary artery without angina pectoris: Secondary | ICD-10-CM | POA: Diagnosis present

## 2014-01-25 DIAGNOSIS — I25810 Atherosclerosis of coronary artery bypass graft(s) without angina pectoris: Secondary | ICD-10-CM

## 2014-01-25 DIAGNOSIS — D72829 Elevated white blood cell count, unspecified: Secondary | ICD-10-CM

## 2014-01-25 DIAGNOSIS — F411 Generalized anxiety disorder: Secondary | ICD-10-CM | POA: Diagnosis present

## 2014-01-25 DIAGNOSIS — Z951 Presence of aortocoronary bypass graft: Secondary | ICD-10-CM

## 2014-01-25 DIAGNOSIS — I10 Essential (primary) hypertension: Secondary | ICD-10-CM | POA: Diagnosis present

## 2014-01-25 DIAGNOSIS — F3289 Other specified depressive episodes: Secondary | ICD-10-CM | POA: Diagnosis present

## 2014-01-25 DIAGNOSIS — J45909 Unspecified asthma, uncomplicated: Secondary | ICD-10-CM | POA: Diagnosis present

## 2014-01-25 DIAGNOSIS — D696 Thrombocytopenia, unspecified: Secondary | ICD-10-CM

## 2014-01-25 DIAGNOSIS — R110 Nausea: Secondary | ICD-10-CM

## 2014-01-25 DIAGNOSIS — J189 Pneumonia, unspecified organism: Secondary | ICD-10-CM

## 2014-01-25 DIAGNOSIS — Z87891 Personal history of nicotine dependence: Secondary | ICD-10-CM

## 2014-01-25 DIAGNOSIS — Z7982 Long term (current) use of aspirin: Secondary | ICD-10-CM

## 2014-01-25 DIAGNOSIS — R112 Nausea with vomiting, unspecified: Secondary | ICD-10-CM

## 2014-01-25 DIAGNOSIS — R69 Illness, unspecified: Secondary | ICD-10-CM

## 2014-01-25 DIAGNOSIS — Z7902 Long term (current) use of antithrombotics/antiplatelets: Secondary | ICD-10-CM

## 2014-01-25 DIAGNOSIS — E039 Hypothyroidism, unspecified: Secondary | ICD-10-CM

## 2014-01-25 DIAGNOSIS — K219 Gastro-esophageal reflux disease without esophagitis: Secondary | ICD-10-CM | POA: Diagnosis present

## 2014-01-25 DIAGNOSIS — E7800 Pure hypercholesterolemia, unspecified: Secondary | ICD-10-CM | POA: Diagnosis present

## 2014-01-25 DIAGNOSIS — J111 Influenza due to unidentified influenza virus with other respiratory manifestations: Secondary | ICD-10-CM

## 2014-01-25 DIAGNOSIS — E785 Hyperlipidemia, unspecified: Secondary | ICD-10-CM | POA: Diagnosis present

## 2014-01-26 ENCOUNTER — Encounter (HOSPITAL_COMMUNITY): Payer: Self-pay

## 2014-01-26 DIAGNOSIS — D696 Thrombocytopenia, unspecified: Secondary | ICD-10-CM

## 2014-01-26 DIAGNOSIS — E039 Hypothyroidism, unspecified: Secondary | ICD-10-CM

## 2014-01-26 DIAGNOSIS — R110 Nausea: Secondary | ICD-10-CM

## 2014-01-26 DIAGNOSIS — J189 Pneumonia, unspecified organism: Principal | ICD-10-CM | POA: Diagnosis present

## 2014-01-26 DIAGNOSIS — I25810 Atherosclerosis of coronary artery bypass graft(s) without angina pectoris: Secondary | ICD-10-CM

## 2014-01-26 LAB — CBC
HCT: 38.3 % — ABNORMAL LOW (ref 39.0–52.0)
HCT: 39.1 % (ref 39.0–52.0)
HEMOGLOBIN: 12.8 g/dL — AB (ref 13.0–17.0)
Hemoglobin: 13.1 g/dL (ref 13.0–17.0)
MCH: 31.1 pg (ref 26.0–34.0)
MCH: 31.4 pg (ref 26.0–34.0)
MCHC: 33.4 g/dL (ref 30.0–36.0)
MCHC: 33.5 g/dL (ref 30.0–36.0)
MCV: 93 fL (ref 78.0–100.0)
MCV: 93.8 fL (ref 78.0–100.0)
PLATELETS: 161 10*3/uL (ref 150–400)
PLATELETS: 166 10*3/uL (ref 150–400)
RBC: 4.12 MIL/uL — AB (ref 4.22–5.81)
RBC: 4.17 MIL/uL — AB (ref 4.22–5.81)
RDW: 14.5 % (ref 11.5–15.5)
RDW: 14.6 % (ref 11.5–15.5)
WBC: 15.2 10*3/uL — ABNORMAL HIGH (ref 4.0–10.5)
WBC: 13.3 10*3/uL — ABNORMAL HIGH (ref 4.0–10.5)

## 2014-01-26 LAB — COMPREHENSIVE METABOLIC PANEL
ALBUMIN: 3 g/dL — AB (ref 3.5–5.2)
ALT: 31 U/L (ref 0–53)
AST: 19 U/L (ref 0–37)
Alkaline Phosphatase: 44 U/L (ref 39–117)
BUN: 14 mg/dL (ref 6–23)
CO2: 26 meq/L (ref 19–32)
CREATININE: 1.22 mg/dL (ref 0.50–1.35)
Calcium: 8.6 mg/dL (ref 8.4–10.5)
Chloride: 101 mEq/L (ref 96–112)
GFR calc Af Amer: 64 mL/min — ABNORMAL LOW (ref >90)
GFR, EST NON AFRICAN AMERICAN: 55 mL/min — AB (ref >90)
Glucose, Bld: 125 mg/dL — ABNORMAL HIGH (ref 70–99)
Potassium: 3.9 mEq/L (ref 3.7–5.3)
SODIUM: 139 meq/L (ref 137–147)
Total Bilirubin: 0.7 mg/dL (ref 0.3–1.2)
Total Protein: 6.9 g/dL (ref 6.0–8.3)

## 2014-01-26 LAB — CREATININE, SERUM
CREATININE: 1.25 mg/dL (ref 0.50–1.35)
GFR calc Af Amer: 62 mL/min — ABNORMAL LOW (ref >90)
GFR, EST NON AFRICAN AMERICAN: 54 mL/min — AB (ref >90)

## 2014-01-26 LAB — TROPONIN I: Troponin I: <0.3 ng/mL (ref <0.30)

## 2014-01-26 MED ORDER — LOSARTAN POTASSIUM 50 MG PO TABS
25.0000 mg | ORAL_TABLET | Freq: Every day | ORAL | Status: DC
  Administered 2014-01-26 – 2014-01-27 (×2): 25 mg via ORAL
  Filled 2014-01-26 (×2): qty 1

## 2014-01-26 MED ORDER — ONDANSETRON HCL 4 MG PO TABS: 4.0000 mg | ORAL_TABLET | Freq: Four times a day (QID) | ORAL | Status: DC | PRN

## 2014-01-26 MED ORDER — SODIUM CHLORIDE 0.9 % IV SOLN
INTRAVENOUS | Status: DC
  Administered 2014-01-26: via INTRAVENOUS

## 2014-01-26 MED ORDER — CEFTRIAXONE SODIUM 1 G IJ SOLR
1.0000 g | INTRAMUSCULAR | Status: DC
Start: 2014-01-26 — End: 2014-01-27
  Administered 2014-01-26: 1 g via INTRAVENOUS
  Filled 2014-01-26: qty 10

## 2014-01-26 MED ORDER — ASPIRIN 81 MG PO CHEW
81.0000 mg | CHEWABLE_TABLET | Freq: Every day | ORAL | Status: DC
  Administered 2014-01-26 – 2014-01-27 (×2): 81 mg via ORAL
  Filled 2014-01-26 (×2): qty 1

## 2014-01-26 MED ORDER — SODIUM CHLORIDE 0.9 % IJ SOLN
3.0000 mL | Freq: Two times a day (BID) | INTRAMUSCULAR | Status: DC
  Administered 2014-01-26 – 2014-01-27 (×3): 3 mL via INTRAVENOUS

## 2014-01-26 MED ORDER — ACETAMINOPHEN 325 MG PO TABS: 650.0000 mg | ORAL_TABLET | Freq: Four times a day (QID) | ORAL | Status: DC | PRN

## 2014-01-26 MED ORDER — METOPROLOL SUCCINATE ER 25 MG PO TB24
25.0000 mg | ORAL_TABLET | Freq: Every day | ORAL | Status: DC
  Administered 2014-01-26 – 2014-01-27 (×2): 25 mg via ORAL
  Filled 2014-01-26 (×2): qty 1

## 2014-01-26 MED ORDER — SIMVASTATIN 10 MG PO TABS
10.0000 mg | ORAL_TABLET | Freq: Every day | ORAL | Status: DC
  Administered 2014-01-26: 10 mg via ORAL
  Filled 2014-01-26: qty 1

## 2014-01-26 MED ORDER — ELTROMBOPAG OLAMINE 50 MG PO TABS: 25.0000 mg | ORAL_TABLET | ORAL | Status: DC

## 2014-01-26 MED ORDER — SODIUM CHLORIDE 0.9 % IJ SOLN
3.0000 mL | INTRAMUSCULAR | Status: DC | PRN
Start: 2014-01-26 — End: 2014-01-27

## 2014-01-26 MED ORDER — ALBUTEROL SULFATE (2.5 MG/3ML) 0.083% IN NEBU: 2.5000 mg | INHALATION_SOLUTION | RESPIRATORY_TRACT | Status: DC | PRN

## 2014-01-26 MED ORDER — DEXTROSE 5 % IV SOLN
500.0000 mg | INTRAVENOUS | Status: DC
Start: 2014-01-26 — End: 2014-01-27
  Administered 2014-01-26: 500 mg via INTRAVENOUS
  Filled 2014-01-26: qty 500

## 2014-01-26 MED ORDER — ALPRAZOLAM 0.5 MG PO TABS: 0.5000 mg | ORAL_TABLET | Freq: Every evening | ORAL | Status: DC | PRN

## 2014-01-26 MED ORDER — FLUOXETINE HCL 10 MG PO CAPS
10.0000 mg | ORAL_CAPSULE | Freq: Every day | ORAL | Status: DC
  Administered 2014-01-26 – 2014-01-27 (×2): 10 mg via ORAL
  Filled 2014-01-26 (×5): qty 1

## 2014-01-26 MED ORDER — HYDROCODONE-ACETAMINOPHEN 5-325 MG PO TABS: 1.0000 | ORAL_TABLET | ORAL | Status: DC | PRN

## 2014-01-26 MED ORDER — GABAPENTIN 100 MG PO CAPS
100.0000 mg | ORAL_CAPSULE | Freq: Three times a day (TID) | ORAL | Status: DC
  Administered 2014-01-26 – 2014-01-27 (×5): 100 mg via ORAL
  Filled 2014-01-26 (×5): qty 1

## 2014-01-26 MED ORDER — ONDANSETRON HCL 4 MG/2ML IJ SOLN
4.0000 mg | Freq: Four times a day (QID) | INTRAMUSCULAR | Status: DC | PRN
  Administered 2014-01-27: 4 mg via INTRAVENOUS
  Filled 2014-01-26: qty 2

## 2014-01-26 MED ORDER — SODIUM CHLORIDE 0.9 % IV SOLN
250.0000 mL | INTRAVENOUS | Status: DC | PRN
Start: 2014-01-26 — End: 2014-01-27

## 2014-01-26 MED ORDER — MOMETASONE FURO-FORMOTEROL FUM 200-5 MCG/ACT IN AERO
2.0000 | INHALATION_SPRAY | Freq: Two times a day (BID) | RESPIRATORY_TRACT | Status: DC
Start: 2014-01-26 — End: 2014-01-27
  Administered 2014-01-26 – 2014-01-27 (×3): 2 via RESPIRATORY_TRACT
  Filled 2014-01-26: qty 8.8

## 2014-01-26 MED ORDER — LEVOTHYROXINE SODIUM 75 MCG PO TABS
175.0000 ug | ORAL_TABLET | Freq: Every day | ORAL | Status: DC
  Administered 2014-01-26 – 2014-01-27 (×2): 175 ug via ORAL
  Filled 2014-01-26 (×4): qty 1

## 2014-01-26 NOTE — H&P (Signed)
PCP:   [name], MD   Chief Complaint:   Nausea  HPI: 80 year old Jeffrey [name] who   has a past medical history of High cholesterol; Thyroid disease; GERD (gastroesophageal reflux disease); Hypertension; Anxiety; and History of heart artery stent who was transferred from [facility] after patient presented there with chief complaint of nausea and fever. As per patient the symptoms started yesterday he also had 3 loose bowel movements yesterday morning and continued to have nausea with generalized muscle aches. He also has been coughing since Thanksgiving. He denies chest pain, patient does have a history of CAD. He denies shortness of breath. In the [facility] ED patient was found to have  pneumonia on chest x-ray and was transferred to [facility]  hospital due to lack of beds available at the at [facility]. Patient denies any chest pain at this time, denies abdominal pain continues to have nausea. He had a fever of 102 earlier today but at this time he is afebrile. He denies dysuria urgency or frequency of urination.   Allergies:   Allergies  Allergen Reactions  . Lipitor [Atorvastatin]   . Methylprednisolone       Past Medical History  Diagnosis Date  . High cholesterol   . Thyroid disease   . GERD (gastroesophageal reflux disease)   . Hypertension   . Anxiety   . History of heart artery stent     Past Surgical History  Procedure Laterality Date  . Coronary artery bypass graft      Prior to Admission medications   Medication Sig Start Date End Date Taking? Authorizing Provider  ALPRAZolam Duanne Moron) 0.5 MG tablet Take 0.5 mg by mouth at bedtime as needed.    Historical Provider, MD  aspirin 81 MG chewable tablet Chew 81 mg by mouth daily.    Historical Provider, MD  calcium carbonate (OS-CAL) 600 MG TABS Take 600 mg by mouth 2 (two) times daily with a meal.    Historical Provider, MD  calcium citrate-vitamin D 200-200 MG-UNIT TABS Take 3 tablets by mouth daily.    Historical  Provider, MD  clopidogrel (PLAVIX) 75 MG tablet Take 75 mg by mouth daily.    Historical Provider, MD  cyclobenzaprine (FLEXERIL) 10 MG tablet Take 10 mg by mouth 3 (three) times daily as needed.    Historical Provider, MD  diclofenac (VOLTAREN) 50 MG EC tablet Take 100 mg by mouth 2 (two) times daily.    Historical Provider, MD  eltrombopag (PROMACTA) 50 MG tablet Take 50 mg by mouth daily. Take on an empty stomach 1 hour before a meal or 2 hours after    Historical Provider, MD  FLUoxetine (PROZAC) 10 MG capsule Take 10 mg by mouth daily.    Historical Provider, MD  fluticasone (FLOVENT HFA) 110 MCG/ACT inhaler Inhale 1 puff into the lungs 2 (two) times daily.    Historical Provider, MD  fluticasone-salmeterol (ADVAIR HFA) 230-21 MCG/ACT inhaler Inhale 2 puffs into the lungs 2 (two) times daily.    Historical Provider, MD  gabapentin (NEURONTIN) 100 MG capsule Take 100 mg by mouth 3 (three) times daily.    Historical Provider, MD  levothyroxine (SYNTHROID, LEVOTHROID) 200 MCG tablet Take 200 mcg by mouth daily.    Historical Provider, MD  levothyroxine (SYNTHROID, LEVOTHROID) 25 MCG tablet Take 25 mcg by mouth daily.    Historical Provider, MD  levothyroxine (SYNTHROID, LEVOTHROID) 50 MCG tablet Take 50 mcg by mouth daily.    Historical Provider, MD  metoprolol succinate (TOPROL-XL)  25 MG 24 hr tablet Take 25 mg by mouth daily.    Historical Provider, MD  omeprazole (PRILOSEC) 40 MG capsule Take 40 mg by mouth daily.    Historical Provider, MD  penicillin v potassium (VEETID) 500 MG tablet  10/21/12   Historical Provider, MD  pravastatin (PRAVACHOL) 20 MG tablet Take 20 mg by mouth daily.    Historical Provider, MD  traMADol (ULTRAM) 50 MG tablet Take 50 mg by mouth every 6 (six) hours as needed.    Historical Provider, MD    Social History:  reports that he has quit smoking. He does not have any smokeless tobacco history on file. He reports that he does not drink alcohol or use illicit  drugs.  Family History  Problem Relation Age of Onset  . Heart disease    . Arthritis       All the positives are listed in BOLD  Review of Systems:  HEENT: Headache, blurred vision, runny nose, sore throat Neck: Hypothyroidism, hyperthyroidism,,lymphadenopathy Chest : Shortness of breath, history of COPD, Asthma Heart : Chest pain, history of coronary arterey disease GI:  Nausea, vomiting, diarrhea, constipation, GERD GU: Dysuria, urgency, frequency of urination, hematuria Neuro: Stroke, seizures, syncope Psych: Depression, anxiety, hallucinations   Physical Exam: Blood pressure 122/Jeffrey, pulse 80, temperature 98.9 F (37.2 C), temperature source Oral, resp. rate 20, weight 105.2 kg (231 lb 14.8 oz). Constitutional:   Patient is a well-developed and well-nourished Jeffrey [name]* in no acute distress and cooperative with exam. Head: Normocephalic and atraumatic Mouth: Mucus membranes moist Eyes: PERRL, EOMI, conjunctivae normal Neck: Supple, No Thyromegaly Cardiovascular: RRR, S1 normal, S2 normal Pulmonary/Chest: CTAB, no wheezes, rales, or rhonchi Abdominal: Soft. Non-tender, non-distended, bowel sounds are normal, no masses, organomegaly, or guarding present.  Neurological: A&O x3, Strenght is normal and symmetric bilaterally, cranial nerve II-XII are grossly intact, no focal motor deficit, sensory intact to light touch bilaterally.  Extremities : No Cyanosis, Clubbing or Edema   Labs on Admission:  No results found for this or any previous visit (from the past 48 hour(s)).  Radiological Exams on Admission: No results found.  Assessment/Plan Principal Problem:   CAP (community acquired pneumonia) Active Problems:   CAD (coronary artery disease) of artery bypass graft   Unspecified hypothyroidism   Thrombocytopenia, unspecified   Nausea alone  Community  acquired pneumonia Patient received antibiotics Rocephin and Zithromax at [facility]. [place] continue him  on Rocephin and Zithromax. Labs reviewed from the ED notes, which shows WBCs of 17.42. Chest x-ray showed large infiltrate in the right middle lobe. Blood cultures were drawn at [facility] ED. Lactic acid was 1.6.  Nausea Patient continues to have nausea, liver enzymes AST 22, ALT 48, alkaline phosphatase 55, total bili 1.0. Likely due to pneumonia. Will also check cardiac enzymes as patient has history of CAD to rule out ischemia. First set of troponin is negative will check troponin every 6 hours x3. EKG showed sinus rhythm with nonspecific ST-T changes.  Flulike illness Patient presented with generalized muscle aches with nausea, influenza PCR done at the PCP office was negative. Documentation is in the shadow chart.  CAD, status post CABG Will continue patient on aspirin, metoprolol, simvastatin.  Thrombocytopenia Patient has history of thrombocytopenia, and takes Promacta 25 mg Monday Wednesday Friday. We'll continue him on the same. Will not give Lovenox for DVT prophylaxis due to history of thrombocytopenia. Platelet count is 174.  Hypothyroidism Will continue the home dose of 175 mcg of Synthroid.  Depression Continue Prozac  History of asthma Continue Advair and albuterol when necessary  DVT prophylaxis SCDs  Code status: Patient is full code  Family discussion: No family at bedside   Time Spent on Admission: 58 minutes  [facility] Pager: [phone] 01/26/2014, 12:10 AM  If 7PM-7AM, please contact night-coverage  [email or web address]  Password [email or web address]

## 2014-01-26 NOTE — Progress Notes (Signed)
PROGRESS NOTE    Jeffrey [name]:[id number] DOB: [birth year] DOA: 01/25/2014 PCP: [name], MD  HPI/Brief narrative 80 year old male with history of dyslipidemia, hypothyroid, GERD, hypertension, anxiety, CAD status post stent was transferred from [facility] with complaints of nausea, high fevers and productive cough. He had transient diarrhea which resolved PTA. Chest x-ray confirmed pneumonia and patient was transferred to [facility] due to lack of hospital beds.   Assessment/Plan:  Community acquired pneumonia-RML - Blood cultures were drawn in [place] ED-need to followup. Chest x-ray from [facility] ED apparently showed large RML infiltrate. - Continue empiric IV Rocephin and azithromycin. Influenza PCR done at PCPs office was apparently negative - Improving. Will need followup chest x-ray in 4-6 weeks to ensure resolution of pneumonia findings.  Nausea -Unclear etiology.? Secondary to acute illness. No diarrhea or vomiting. LFTs have normalized. Continue when necessary antiemetics and monitor.  Flulike illness - Apparently negative influenza PCR at PCPs office. Clinically improved.  Leukocytosis - Improving  Hypertension - Controlled  Hypothyroid - Continue Synthroid   History of asthma - Stable  History of depression - Stable  History of CAD status post CABG - Asymptomatic of ischemic type chest pain. Troponins negative.   Code Status: Full  Family Communication: None at bedside  Disposition Plan: Home on medically stable   Consultants:  None  Procedures:  None  Antibiotics:  IV Rocephin and azithromycin 01/26/14 >   Subjective: Feels much better. Mild intermittent cough-white/brown sputum. Denies dyspnea. Some anterior chest soreness with coughing. Still has nausea but able to tolerate diet. No diarrhea for last 48 hours. No vomiting. Denies muscle aches.  Objective: Filed Vitals:   01/25/14 2300 01/26/14 0527  01/26/14 1008  BP: 122/63 126/68   Pulse: 80 80   Temp: 98.9 F (37.2 C) 98.6 F (37 C)   TempSrc: Oral Oral   Resp: 20 20   Height:   5\' 11"  (1.803 m)  Weight: 105.2 kg (231 lb 14.8 oz)      Intake/Output Summary (Last 24 hours) at 01/26/14 1050 Last data filed at 01/26/14 0754  Gross per 24 hour  Intake      0 ml  Output    825 ml  Net   -825 ml   Filed Weights   01/25/14 2300  Weight: 105.2 kg (231 lb 14.8 oz)     Exam:  General exam: Elderly male, moderately built and obese, sitting up in bed without distress.  Respiratory system:  reduced breath sounds in the bases with few bibasal crackles. Rest of lung fields clear to auscultation. No increased work of breathing. Cardiovascular system: S1 & S2 heard, RRR. No JVD, murmurs, gallops, clicks or pedal edema. Gastrointestinal system: Abdomen is nondistended, soft and nontender. Normal bowel sounds heard. Central nervous system: Alert and oriented. No focal neurological deficits. Extremities: Symmetric 5 x 5 power.   Data Reviewed: Basic Metabolic Panel:  Recent Labs Lab 01/26/14 0027 01/26/14 0547  NA  --  139  K  --  3.9  CL  --  101  CO2  --  26  GLUCOSE  --  125*  BUN  --  14  CREATININE 1.25 1.22  CALCIUM  --  8.6   Liver Function Tests:  Recent Labs Lab 01/26/14 0547  AST 19  ALT 31  ALKPHOS 44  BILITOT 0.7  PROT 6.9  ALBUMIN 3.0*   No results found for this basename: LIPASE, AMYLASE,  in the last 168 hours  No results found for this basename: AMMONIA,  in the last 168 hours CBC:  Recent Labs Lab 01/26/14 0027 01/26/14 0547  WBC 15.2* 13.3*  HGB 12.8* 13.1  HCT 38.3* 39.1  MCV 93.0 93.8  PLT 161 166   Cardiac Enzymes:  Recent Labs Lab 01/26/14 0027 01/26/14 0547  TROPONINI <0.30 <0.30   BNP (last 3 results) No results found for this basename: PROBNP,  in the last 8760 hours CBG: No results found for this basename: GLUCAP,  in the last 168 hours  No results found for this  or any previous visit (from the past 240 hour(s)).    Studies: No results found.      Scheduled Meds: . aspirin  81 mg Oral Daily  . azithromycin  500 mg Intravenous Q24H  . cefTRIAXone (ROCEPHIN)  IV  1 g Intravenous Q24H  . [START ON 01/27/2014] eltrombopag  25 mg Oral Q M,W,F  . FLUoxetine  10 mg Oral Daily  . gabapentin  100 mg Oral TID  . levothyroxine  175 mcg Oral QAC breakfast  . losartan  25 mg Oral Daily  . metoprolol succinate  25 mg Oral Daily  . mometasone-formoterol  2 puff Inhalation BID  . simvastatin  10 mg Oral q1800  . sodium chloride  3 mL Intravenous Q12H   Continuous Infusions: . sodium chloride 75 mL/hr at 01/26/14 0027    Principal Problem:   CAP (community acquired pneumonia) Active Problems:   CAD (coronary artery disease) of artery bypass graft   Unspecified hypothyroidism   Thrombocytopenia, unspecified   Nausea alone    Time spent: 2 minutes    Khushi Zupko, MD, FACP, FHM. [facility] Pager [phone]  If 7PM-7AM, please contact night-coverage [email or web address] Password [email or web address] 01/26/2014, 10:50 AM    LOS: 1 day

## 2014-01-26 NOTE — Progress Notes (Signed)
Utilization Review Complete  

## 2014-01-27 DIAGNOSIS — D72829 Elevated white blood cell count, unspecified: Secondary | ICD-10-CM

## 2014-01-27 DIAGNOSIS — J111 Influenza due to unidentified influenza virus with other respiratory manifestations: Secondary | ICD-10-CM

## 2014-01-27 DIAGNOSIS — R112 Nausea with vomiting, unspecified: Secondary | ICD-10-CM

## 2014-01-27 LAB — CBC
HEMATOCRIT: 38.7 % — AB (ref 39.0–52.0)
Hemoglobin: 12.9 g/dL — ABNORMAL LOW (ref 13.0–17.0)
MCH: 31.3 pg (ref 26.0–34.0)
MCHC: 33.3 g/dL (ref 30.0–36.0)
MCV: 93.9 fL (ref 78.0–100.0)
Platelets: 186 10*3/uL (ref 150–400)
RBC: 4.12 MIL/uL — ABNORMAL LOW (ref 4.22–5.81)
RDW: 14.3 % (ref 11.5–15.5)
WBC: 10.3 10*3/uL (ref 4.0–10.5)

## 2014-01-27 MED ORDER — LEVOFLOXACIN 750 MG PO TABS: 750.0000 mg | ORAL_TABLET | Freq: Every day | ORAL | Status: DC

## 2014-01-27 MED ORDER — ONDANSETRON HCL 4 MG PO TABS: 4.0000 mg | ORAL_TABLET | Freq: Three times a day (TID) | ORAL | Status: DC | PRN

## 2014-01-27 MED ORDER — GUAIFENESIN ER 600 MG PO TB12: 600.0000 mg | ORAL_TABLET | Freq: Two times a day (BID) | ORAL | Status: DC

## 2014-01-27 MED ORDER — LEVOFLOXACIN 750 MG PO TABS
750.0000 mg | ORAL_TABLET | Freq: Every day | ORAL | Status: DC
  Administered 2014-01-27: 750 mg via ORAL
  Filled 2014-01-27: qty 1

## 2014-01-27 NOTE — Discharge Planning (Signed)
Pt stated he was ready to be [facility] and he had no pain.  IV was removed and dry and intact with guaze.  Pt was given DC papers and education with family in the room.  Pt was given scripts and explained what s/sx to look for that what cause need to return to hospital or call doctor.  Pt also told of need for FU appointments.  Pt will be wheeled to car by PCT and family when ready.

## 2014-01-27 NOTE — Progress Notes (Signed)
ANTIBIOTIC CONSULT NOTE - INITIAL  Pharmacy Consult for Levaquin Indication: pneumonia  Allergies  Allergen Reactions  . Lipitor [Atorvastatin]   . Methylprednisolone     Patient Measurements: Height: 5\' 11"  (180.3 cm) Weight: 231 lb 14.8 oz (105.2 kg) IBW/kg (Calculated) : 75.3  Vital Signs: Temp: 98.5 F (36.9 C) (02/06 0715) Temp src: Oral (02/06 0715) BP: 137/55 mmHg (02/06 0715) Pulse Rate: 52 (02/06 0715) Intake/Output from previous day: 02/05 0701 - 02/06 0700 In: 1020 [P.O.:720; IV Piggyback:300] Out: 2175 [Urine:2175] Intake/Output from this shift:    Labs:  Recent Labs  01/26/14 0027 01/26/14 0547 01/27/14 0609  WBC 15.2* 13.3* 10.3  HGB 12.8* 13.1 12.9*  PLT 161 166 186  CREATININE 1.25 1.22  --    Estimated Creatinine Clearance: 62.6 ml/min (by C-G formula based on Cr of 1.22). No results found for this basename: VANCOTROUGH, VANCOPEAK, VANCORANDOM, GENTTROUGH, GENTPEAK, GENTRANDOM, TOBRATROUGH, TOBRAPEAK, TOBRARND, AMIKACINPEAK, AMIKACINTROU, AMIKACIN,  in the last 72 hours   Microbiology: No results found for this or any previous visit (from the past 720 hour(s)).  Medical History: Past Medical History  Diagnosis Date  . High cholesterol   . Thyroid disease   . GERD (gastroesophageal reflux disease)   . Hypertension   . Anxiety   . History of heart artery stent     Medications:  Scheduled:  . aspirin  81 mg Oral Daily  . azithromycin  500 mg Intravenous Q24H  . cefTRIAXone (ROCEPHIN)  IV  1 g Intravenous Q24H  . eltrombopag  25 mg Oral Q M,W,F  . FLUoxetine  10 mg Oral Daily  . gabapentin  100 mg Oral TID  . levothyroxine  175 mcg Oral QAC breakfast  . losartan  25 mg Oral Daily  . metoprolol succinate  25 mg Oral Daily  . mometasone-formoterol  2 puff Inhalation BID  . simvastatin  10 mg Oral q1800  . sodium chloride  3 mL Intravenous Q12H   Assessment: 80 yo M admitted with PNA per CXR.  He was empirically started on  Rocephin/Zithromax.  Given his clinical improvement (afebrile, normal WBC, RR at goal) orders now to transition to oral Levaquin.   Renal function has been stable & at patient's baseline.    Levaquin 2/6>> Rocephin 2/5>>2/6 Zithromax 2/5>>2/6>>  Goal of Therapy:  Eradicate infection.  Plan:  Levaquin 750mg  po daily Duration of therapy per MD- 5-7 days recommended per guidelines Pharmacy to sign off.  Please re-consult as needed.  Biagio Borg 01/27/2014,10:27 AM

## 2014-01-27 NOTE — Discharge Summary (Addendum)
Physician Discharge Summary  Jeffrey [name] XTG:[id number] DOB: [birth year] DOA: 01/25/2014  PCP: [name], MD  Admit date: 01/25/2014 Discharge date: 01/27/2014  Time spent: Less than 30 minutes  Recommendations for Outpatient Follow-up:  1. Dr [name], PCP in 5 days with repeat labs (CBC & BMP) 2. Recommend repeat chest x-ray in 4-6 weeks to ensure resolution of pneumonia findings. 3. Followup final blood culture results that were drawn at [facility], prior to transfer to [facility].  Discharge Diagnoses:  Principal Problem:   CAP (community acquired pneumonia) Active Problems:   CAD (coronary artery disease) of artery bypass graft   Unspecified hypothyroidism   Thrombocytopenia, unspecified   Nausea alone   Discharge Condition: Improved & Stable  Diet recommendation: Heart healthy  Filed Weights   01/25/14 2300  Weight: 105.2 kg (231 lb 14.8 oz)    History of present illness:  80 year old male with history of dyslipidemia, hypothyroid, GERD, hypertension, anxiety, CAD status post stent was transferred from [facility] with complaints of nausea, high fevers and productive cough. He had transient diarrhea which resolved PTA. Chest x-ray confirmed pneumonia and patient was transferred to [facility] due to lack of hospital beds.  Hospital Course:   Community acquired pneumonia-RML  - Blood cultures were drawn in [facility] ED-need to followup. Chest x-ray from [facility] ED apparently showed large RML infiltrate.  - Patient was empirically treated with IV Rocephin and azithromycin and has completed 48 hours of same. Influenza PCR done at [facility] was negative for influenza A and B.  - Clinically improved. Not hypoxic with activity. We'll discharge on oral Levaquin to complete total 7 days treatment. - Will need followup chest x-ray in 4-6 weeks to ensure resolution of pneumonia findings. Discussed with patient who verbalizes  understanding.  Nausea  -Unclear etiology.? Secondary to acute illness. LFTs have normalized. - States that his nausea has significantly improved. Tolerating diet. No vomiting or diarrhea.   Flulike illness  - Apparently negative influenza PCR done at outside hospital. Clinically improved.   Leukocytosis  - Secondary to pneumonia and flu like illness. Resolved  Hypertension  - Controlled. Patient had been on metoprolol and ARB (newly started) in the hospital but will discharge on metoprolol alone.   Hypothyroid  - Continue Synthroid   History of asthma  - Stable   History of depression  - Stable. Continue home medications  History of CAD status post CABG  - Asymptomatic of ischemic type chest pain. Troponins negative. Continue aspirin, Plavix and metoprolol. Of note patient is also on schedule diclofenac at home which increases risks of bleeding. Same has been discussed extensively with patient and is advised to followup with PCP to find alternative medications for pain. Also patient does not on statins at home which can be addressed during PCP visit.  Patient had been placed on Promacta but he apparently has not been on this at home-will not be discharged on same.  Tried to call spouse at home by phone but was not able to reach her.  Consultations:  None  Procedures:  None    Discharge Exam:  Complaints:  Cough has improved-has intermittent cough with brown sputum. No dyspnea. Nausea almost resolved. Tolerating diet.  Filed Vitals:   01/26/14 2100 01/27/14 0715 01/27/14 0806 01/27/14 1130  BP: 143/76 137/55  138/58  Pulse: 69 52    Temp: 98.6 F (37 C) 98.5 F (36.9 C)    TempSrc: Oral Oral    Resp:  18 20    Height:      Weight:      SpO2: 94% 90% 93%     General exam: Elderly male, moderately built and obese, sitting up in bed without distress. Was seen eating breakfast this morning. Respiratory system: reduced breath sounds in the bases with few bibasal  crackles. Rest of lung fields clear to auscultation. No increased work of breathing.  Cardiovascular system: S1 & S2 heard, RRR. No JVD, murmurs, gallops, clicks or pedal edema.  Gastrointestinal system: Abdomen is nondistended, soft and nontender. Normal bowel sounds heard.  Central nervous system: Alert and oriented. No focal neurological deficits.  Extremities: Symmetric 5 x 5 power.  Discharge Instructions      Discharge Orders   Future Orders Complete By Expires   Call MD for:  difficulty breathing, headache or visual disturbances  As directed    Call MD for:  extreme fatigue  As directed    Call MD for:  persistant dizziness or light-headedness  As directed    Call MD for:  persistant nausea and vomiting  As directed    Call MD for:  temperature >100.4  As directed    Diet - low sodium heart healthy  As directed    Increase activity slowly  As directed        Medication List         ALPRAZolam 0.5 MG tablet  Commonly known as:  XANAX  Take 0.5 mg by mouth at bedtime as needed.     aspirin 81 MG chewable tablet  Chew 81 mg by mouth daily.     clopidogrel 75 MG tablet  Commonly known as:  PLAVIX  Take 75 mg by mouth daily.     cyclobenzaprine 10 MG tablet  Commonly known as:  FLEXERIL  Take 10 mg by mouth 3 (three) times daily as needed.     diclofenac 50 MG EC tablet  Commonly known as:  VOLTAREN  Take 100 mg by mouth 2 (two) times daily.     FLUoxetine 10 MG capsule  Commonly known as:  PROZAC  Take 10 mg by mouth daily.     fluticasone 110 MCG/ACT inhaler  Commonly known as:  FLOVENT HFA  Inhale 1 puff into the lungs 2 (two) times daily.     fluticasone-salmeterol 230-21 MCG/ACT inhaler  Commonly known as:  ADVAIR HFA  Inhale 2 puffs into the lungs 2 (two) times daily.     gabapentin 100 MG capsule  Commonly known as:  NEURONTIN  Take 100 mg by mouth 3 (three) times daily.     guaiFENesin 600 MG 12 hr tablet  Commonly known as:  MUCINEX  Take 1  tablet (600 mg total) by mouth 2 (two) times daily.     levofloxacin 750 MG tablet  Commonly known as:  LEVAQUIN  Take 1 tablet (750 mg total) by mouth daily.  Start taking on:  01/28/2014     levothyroxine 200 MCG tablet  Commonly known as:  SYNTHROID, LEVOTHROID  Take 200 mcg by mouth daily.     metoprolol succinate 25 MG 24 hr tablet  Commonly known as:  TOPROL-XL  Take 25 mg by mouth daily.     omeprazole 40 MG capsule  Commonly known as:  PRILOSEC  Take 40 mg by mouth daily.     ondansetron 4 MG tablet  Commonly known as:  ZOFRAN  Take 1 tablet (4 mg total) by mouth every 8 (eight) hours as needed  for nausea or vomiting.       Follow-up Information   Follow up with [name], MD. Schedule an appointment as soon as possible for a visit in 5 days. (To be seen with repeat labs (CBC & BMP).)    Contact information:   [facility] [address] [place] [address] [phone]       The results of significant diagnostics from this hospitalization (including imaging, microbiology, ancillary and laboratory) are listed below for reference.    Significant Diagnostic Studies: No results found.  Microbiology: No results found for this or any previous visit (from the past 240 hour(s)).   Labs: Basic Metabolic Panel:  Recent Labs Lab 01/26/14 0027 01/26/14 0547  NA  --  139  K  --  3.9  CL  --  101  CO2  --  26  GLUCOSE  --  125*  BUN  --  14  CREATININE 1.25 1.22  CALCIUM  --  8.6   Liver Function Tests:  Recent Labs Lab 01/26/14 0547  AST 19  ALT 31  ALKPHOS 44  BILITOT 0.7  PROT 6.9  ALBUMIN 3.0*   No results found for this basename: LIPASE, AMYLASE,  in the last 168 hours No results found for this basename: AMMONIA,  in the last 168 hours CBC:  Recent Labs Lab 01/26/14 0027 01/26/14 0547 01/27/14 0609  WBC 15.2* 13.3* 10.3  HGB 12.8* 13.1 12.9*  HCT 38.3* 39.1 38.7*  MCV 93.0 93.8 93.9  PLT 161 166 186   Cardiac  Enzymes:  Recent Labs Lab 01/26/14 0027 01/26/14 0547 01/26/14 1241  TROPONINI <0.30 <0.30 <0.30   BNP: BNP (last 3 results) No results found for this basename: PROBNP,  in the last 8760 hours CBG: No results found for this basename: GLUCAP,  in the last 168 hours   Signed:  Vernell Leep, MD, FACP, FHM. [facility] Pager [phone]  If 7PM-7AM, please contact night-coverage [email or web address] Password [facility] 01/27/2014, 2:56 PM   Addendum After discharge paperwork was done, called patient's nurse who advised that in the last hour, patient had nonbloody emesis x2. He subsequently felt better and tolerated some crackers after antiemetics. Discussed with patient who states that he's feeling better and this was his first episode of emesis since hospitalization. Passing flatus. No BM since 01/25/14. Denies abdominal pain or distention. ?  posttussive emesis. He is comfortable going home on when necessary antiemetics. Advised prune juice and over-the-counter stool softener/laxative. Discussed with him and his spouse at length, updated care and answered questions. Advised them to seek immediate medical attention in case he were to get worse in any way. They verbalized understanding.  Vernell Leep, MD, FACP, FHM. [facility] Pager [phone]  If 7PM-7AM, please contact night-coverage [email or web address] Password [email or web address] 01/27/2014, 2:58 PM

## 2014-01-27 NOTE — Discharge Planning (Signed)
Pt SPO2 at rest was 94% on RA.  Ambulated in [place] about 387ft, pt was not winded and SPO2 levels remained at 90 or above.

## 2014-04-11 ENCOUNTER — Ambulatory Visit (INDEPENDENT_AMBULATORY_CARE_PROVIDER_SITE_OTHER): Payer: Medicare Other | Admitting: Orthopedic Surgery

## 2014-04-11 VITALS — BP 134/77 | Ht 71.0 in | Wt 231.0 lb

## 2014-04-11 DIAGNOSIS — M25462 Effusion, left knee: Secondary | ICD-10-CM

## 2014-04-11 DIAGNOSIS — M1710 Unilateral primary osteoarthritis, unspecified knee: Secondary | ICD-10-CM

## 2014-04-11 DIAGNOSIS — M179 Osteoarthritis of knee, unspecified: Secondary | ICD-10-CM

## 2014-04-11 DIAGNOSIS — IMO0002 Reserved for concepts with insufficient information to code with codable children: Secondary | ICD-10-CM

## 2014-04-11 DIAGNOSIS — M25469 Effusion, unspecified knee: Secondary | ICD-10-CM

## 2014-04-11 NOTE — Progress Notes (Signed)
Patient ID: Jeffrey [name], male   DOB: 1934/09/29, 80 y.o.   MRN: [id number]  Knee  Injection and aspiration Procedure Note  Pre-operative Diagnosis: left knee oa, effusion  Post-operative Diagnosis: same  Indications: pain, swelling  Anesthesia: ethyl chloride   Procedure Details   Verbal consent was obtained for the procedure. Time out was completed.The joint was prepped with alcohol, followed by  Ethyl chloride spray and The 18-gauge needle was inserted into the joint via lateral approach and we aspirated approximately 75 cc of clear yellow fluid  This was followed by the injection of 21ml 1% lidocaine and 1 ml of depomedrol  was then injected into the joint . The needle was removed and the area cleansed and dressed.  Complications:  None; patient tolerated the procedure well.

## 2014-04-11 NOTE — Patient Instructions (Addendum)
activities as tolerated  Joint Injection  Care After  Refer to this sheet in the next few days. These instructions provide you with information on caring for yourself after you have had a joint injection. Your caregiver also may give you more specific instructions. Your treatment has been planned according to current medical practices, but problems sometimes occur. Call your caregiver if you have any problems or questions after your procedure.  After any type of joint injection, it is not uncommon to experience:  Soreness, swelling, or bruising around the injection site.  Mild numbness, tingling, or weakness around the injection site caused by the numbing medicine used before or with the injection. It also is possible to experience the following effects associated with the specific agent after injection:  Iodine-based contrast agents:  Allergic reaction (itching, hives, widespread redness, and swelling beyond the injection site).  Corticosteroids (These effects are rare.):  Allergic reaction.  Increased blood sugar levels (If you have diabetes and you notice that your blood sugar levels have increased, notify your caregiver).  Increased blood pressure levels.  Mood swings.  Hyaluronic acid in the use of viscosupplementation.  Temporary heat or redness.  Temporary rash and itching.  Increased fluid accumulation in the injected joint. These effects all should resolve within a day after your procedure.  HOME CARE INSTRUCTIONS  Limit yourself to light activity the day of your procedure. Avoid lifting heavy objects, bending, stooping, or twisting.  Take prescription or over-the-counter pain medication as directed by your caregiver.  You may apply ice to your injection site to reduce pain and swelling the day of your procedure. Ice may be applied 3-4 times:  Put ice in a plastic bag.  Place a towel between your skin and the bag.  Leave the ice on for no longer than 15-20 minutes each time. SEEK  IMMEDIATE MEDICAL CARE IF:  Pain and swelling get worse rather than better or extend beyond the injection site.  Numbness does not go away.  Blood or fluid continues to leak from the injection site.  You have chest pain.  You have swelling of your face or tongue.  You have trouble breathing or you become dizzy.  You develop a fever, chills, or severe tenderness at the injection site that last longer than 1 day. MAKE SURE YOU:  Understand these instructions.  Watch your condition.  Get help right away if you are not doing well or if you get worse. Document Released: 08/21/2011 Document Revised: 03/01/2012 Document Reviewed: 08/21/2011  [facility] Patient Information 2014 [facility].

## 2014-05-18 ENCOUNTER — Ambulatory Visit (INDEPENDENT_AMBULATORY_CARE_PROVIDER_SITE_OTHER): Payer: Medicare Other | Admitting: Orthopedic Surgery

## 2014-05-18 VITALS — BP 138/80 | Ht 71.0 in | Wt 231.0 lb

## 2014-05-18 DIAGNOSIS — IMO0002 Reserved for concepts with insufficient information to code with codable children: Secondary | ICD-10-CM

## 2014-05-18 DIAGNOSIS — M1710 Unilateral primary osteoarthritis, unspecified knee: Secondary | ICD-10-CM

## 2014-05-18 DIAGNOSIS — M179 Osteoarthritis of knee, unspecified: Secondary | ICD-10-CM

## 2014-05-18 NOTE — Progress Notes (Signed)
Patient ID: Jeffrey [name], male   DOB: [birth year], 80 y.o.   MRN: [id number]  Chief Complaint  Patient presents with  . Knee Pain    Request fluid to be drawn off left knee    Knee  Injection and aspiration Procedure Note  Pre-operative Diagnosis: left knee oa, effusion  Post-operative Diagnosis: same  Indications: pain, swelling  Anesthesia: ethyl chloride   Procedure Details   Verbal consent was obtained for the procedure. Time out was completed.The joint was prepped with alcohol, followed by  Ethyl chloride spray and The 18-gauge needle was inserted into the joint via lateral approach and we aspirated approximately 100 cc of clear yellow fluid  This was followed by the injection of 23ml 1% lidocaine and 1 ml of depomedrol  was then injected into the joint . The needle was removed and the area cleansed and dressed.  Complications:  None; patient tolerated the procedure well.

## 2014-05-29 ENCOUNTER — Telehealth: Payer: Self-pay | Admitting: Orthopedic Surgery

## 2014-05-29 NOTE — Telephone Encounter (Signed)
Call received from patient, states having a lot of left knee swelling again, was seen for this problem 05/18/14, and on 04/11/14 prior to this visit.  Patient states Dr Aline Brochure has also discussed total knee replacement.  I also have asked patient if he has seen his primary care, in the event there may any ongoing medical issues - he said "no, just my knee swelling."  Okay to schedule?  Please advise if any other recommendations.  Patient's ph#'s are [phone] [facility]), [phone] ([place])

## 2014-05-30 ENCOUNTER — Telehealth: Payer: Self-pay | Admitting: Orthopedic Surgery

## 2014-05-30 NOTE — Telephone Encounter (Signed)
Noted  

## 2014-05-30 NOTE — Telephone Encounter (Signed)
At approximately same time as Jeffrey Long, LPN's note, I also spoke with patient, as he had called back, still stating that he cannot wait, and that he wished to speak directly with Jeffrey [name].  I re-iterated the clinical direction per nurse, as well as offering a 30-day appointment, which patient elected to not take at this time, and re-checked personally with Jeffrey [name], who again states that this is his recommendation.  I also relayed to patient that he has the option to contact his primary care, and / or go to the Emergency room if his pain and swelling is persisting, in the event that there could perhaps be another medical issue contributing to the increased knee pain and swelling.  He does not seem to be willing to proceed in this way, either.  Per Jeffrey [name], "have our office manager call him."  Contacted our manager, Jeffrey [name], who is contacting patient.

## 2014-05-30 NOTE — Telephone Encounter (Signed)
Spoke with patient and advised per Dr. Aline Brochure, that we could see him again in one month to aspirate, however, patient stated that his knee is swollen so bad he can not wait. I advised we would have to discuss again with Dr. Aline Brochure and call him back. [phone]

## 2014-05-31 NOTE — Telephone Encounter (Signed)
Returned call to patient, and advised that he saw Dr. Joya Salm with [facility]. Their phone number was given to patient.

## 2014-05-31 NOTE — Telephone Encounter (Signed)
On 05/30/14, approximately 11:30am, patient called back to inquire about the name of the neurosurgeon whom Dr. Aline Brochure had recommended in the past.  Patient said he would like to contact this office, and needs the name of the doctor.  I relayed that we will need to research through his records to locate this information, then can call him back.  Forward to nurse.

## 2014-06-05 NOTE — Telephone Encounter (Signed)
Closing note -- patient had relayed that he went on to Urgent care for the knee swelling, 05/30/14.

## 2014-06-19 ENCOUNTER — Other Ambulatory Visit: Payer: Self-pay | Admitting: Orthopaedic Surgery

## 2014-06-19 ENCOUNTER — Other Ambulatory Visit (HOSPITAL_COMMUNITY): Payer: Self-pay | Admitting: *Deleted

## 2014-06-19 NOTE — Addendum Note (Signed)
Addended by: Melrose Nakayama on: 06/19/2014 04:20 PM   Modules accepted: Orders

## 2014-06-19 NOTE — Pre-Procedure Instructions (Signed)
JOBIN MONTELONGO  06/19/2014   Your procedure is scheduled on:  Thursday, June 22, 2014 at 9:45 AM.   Report to [facility] Entrance "A" Admitting Office at 7:45 AM.   Call this number if you have problems the morning of surgery: [phone]   Remember:   Do not eat food or drink liquids after midnight Wednesday, 06/21/14.   Take these medicines the morning of surgery with A SIP OF WATER:  Xanax if needed, Prozac, Hydrocodone if needed, synthroid, Toprol XL, Prilosec  Stop taking Aspirin, Aleve, Ibuprofen, BC's, Goody's,Herbal medications, and Fish Oil   Do not wear jewelry.  Do not wear lotions, powders, or cologne. You may wear deodorant.  Men may shave face and neck.  Do not bring valuables to the hospital.  [facility] is not responsible                  for any belongings or valuables.               Contacts, dentures or bridgework may not be worn into surgery.  Leave suitcase in the car. After surgery it may be brought to your room.  For patients admitted to the hospital, discharge time is determined by your                treatment team.                  Special Instructions: [facility] - Preparing for Surgery  Before surgery, you can play an important role.  Because skin is not sterile, your skin needs to be as free of germs as possible.  You can reduce the number of germs on you skin by washing with CHG (chlorahexidine gluconate) soap before surgery.  CHG is an antiseptic cleaner which kills germs and bonds with the skin to continue killing germs even after washing.  Please DO NOT use if you have an allergy to CHG or antibacterial soaps.  If your skin becomes reddened/irritated stop using the CHG and inform your nurse when you arrive at Short Stay.  Do not shave (including legs and underarms) for at least 48 hours prior to the first CHG shower.  You may shave your face.  Please follow these instructions carefully:   1.  Shower with CHG Soap the night before surgery  and the                                morning of Surgery.  2.  If you choose to wash your hair, wash your hair first as usual with your       normal shampoo.  3.  After you shampoo, rinse your hair and body thoroughly to remove the                      Shampoo.  4.  Use CHG as you would any other liquid soap.  You can apply chg directly       to the skin and wash gently with scrungie or a clean washcloth.  5.  Apply the CHG Soap to your body ONLY FROM THE NECK DOWN.        Do not use on open wounds or open sores.  Avoid contact with your eyes, ears, mouth and genitals (private parts).  Wash genitals (private parts) with your normal soap.  6.  Wash thoroughly, paying special attention to the  area where your surgery        will be performed.  7.  Thoroughly rinse your body with warm water from the neck down.  8.  DO NOT shower/wash with your normal soap after using and rinsing off       the CHG Soap.  9.  Pat yourself dry with a clean towel.            10.  Wear clean pajamas.            11.  Place clean sheets on your bed the night of your first shower and do not        sleep with pets.  Day of Surgery  Do not apply any lotions the morning of surgery.  Please wear clean clothes to the hospital/surgery center.      Please read over the following fact sheets that you were given: Pain Booklet, Coughing and Deep Breathing and Surgical Site Infection Prevention

## 2014-06-20 ENCOUNTER — Encounter (HOSPITAL_COMMUNITY)
Admission: RE | Admit: 2014-06-20 | Discharge: 2014-06-20 | Disposition: A | Discharge status: Home / Self Care | Payer: Medicare Other | Source: Ambulatory Visit | Attending: Orthopaedic Surgery | Admitting: Orthopaedic Surgery

## 2014-06-20 ENCOUNTER — Encounter (HOSPITAL_COMMUNITY): Payer: Self-pay

## 2014-06-20 ENCOUNTER — Encounter (HOSPITAL_COMMUNITY)
Admission: RE | Admit: 2014-06-20 | Discharge: 2014-06-20 | Disposition: A | Discharge status: Home / Self Care | Payer: Medicare Other | Source: Ambulatory Visit | Attending: Anesthesiology | Admitting: Anesthesiology

## 2014-06-20 HISTORY — DX: Pneumonia, unspecified organism: J18.9

## 2014-06-20 HISTORY — DX: Personal history of other diseases of the digestive system: Z87.19

## 2014-06-20 HISTORY — DX: Shortness of breath: R06.02

## 2014-06-20 HISTORY — DX: Atherosclerotic heart disease of native coronary artery without angina pectoris: I25.10

## 2014-06-20 HISTORY — DX: Ischemic cardiomyopathy: I25.5

## 2014-06-20 HISTORY — DX: Immune thrombocytopenic purpura: D69.3

## 2014-06-20 HISTORY — DX: Nonrheumatic aortic (valve) stenosis: I35.0

## 2014-06-20 HISTORY — DX: Unspecified osteoarthritis, unspecified site: M19.90

## 2014-06-20 HISTORY — DX: Hypothyroidism, unspecified: E03.9

## 2014-06-20 LAB — BASIC METABOLIC PANEL
BUN: 27 mg/dL — ABNORMAL HIGH (ref 6–23)
CALCIUM: 8.7 mg/dL (ref 8.4–10.5)
CO2: 21 mEq/L (ref 19–32)
Chloride: 104 mEq/L (ref 96–112)
Creatinine, Ser: 1.01 mg/dL (ref 0.50–1.35)
GFR calc Af Amer: 81 mL/min — ABNORMAL LOW (ref >90)
GFR, EST NON AFRICAN AMERICAN: 70 mL/min — AB (ref >90)
GLUCOSE: 167 mg/dL — AB (ref 70–99)
POTASSIUM: 4.4 meq/L (ref 3.7–5.3)
SODIUM: 141 meq/L (ref 137–147)

## 2014-06-20 LAB — CBC
HCT: 42.5 % (ref 39.0–52.0)
HEMOGLOBIN: 14.1 g/dL (ref 13.0–17.0)
MCH: 30.9 pg (ref 26.0–34.0)
MCHC: 33.2 g/dL (ref 30.0–36.0)
MCV: 93 fL (ref 78.0–100.0)
Platelets: 271 10*3/uL (ref 150–400)
RBC: 4.57 MIL/uL (ref 4.22–5.81)
RDW: 13.4 % (ref 11.5–15.5)
WBC: 11.4 10*3/uL — ABNORMAL HIGH (ref 4.0–10.5)

## 2014-06-20 NOTE — Progress Notes (Addendum)
Request sent to Dr Gibson Ramp for last office visit and any heart tests. Patient has received blood products in the past for platelets and did have a reaction. States that he almost died.  Card cath done at [facility] request sent  PCP is Dr Shearon Stalls  Cardiologist is Dr Gibson Ramp states that he saw Dr Gibson Ramp for cardiac clearance in May for this upcoming surgery. Also sees Dr Luvenia Heller in [facility] for platelets.

## 2014-06-20 NOTE — Progress Notes (Signed)
06/20/14 0905  OBSTRUCTIVE SLEEP APNEA  Have you ever been diagnosed with sleep apnea through a sleep study? No  Do you snore loudly (loud enough to be heard through closed doors)?  1  Do you often feel tired, fatigued, or sleepy during the daytime? 1  Has anyone observed you stop breathing during your sleep? 0  Do you have, or are you being treated for high blood pressure? 1  BMI more than 35 kg/m2? 0  Age over 78 years old? 1  Neck circumference greater than 40 cm/16 inches? 1  Gender: 1  Obstructive Sleep Apnea Score 6  Score 4 or greater  Results sent to PCP

## 2014-06-21 ENCOUNTER — Encounter (HOSPITAL_COMMUNITY): Payer: Self-pay

## 2014-06-21 NOTE — Progress Notes (Addendum)
Anesthesia Chart Review:  Patient is a 80 year old male scheduled for left knee arthroscopy with irrigation and debridement on 06/22/14 by Dr. Rhona Raider.    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 BMI is consistent with obesity. PCP is Dr. Miquel Dunn.  Hematologist is Dr. Gaynelle Arabian.  Cardiologist is Dr. Carrolyn Leigh who initially cleared patient without need for further cardiac testing for TKA (clearance received by Dr. Rhona Raider on 06/19/14); however, patient developed a knee effusion so is now only having arthroscopy with I&D.  Planned TKA once effusion resolved.    EKG on 05/12/14 showed NSR, non-specific lateral T wave flattening.   Nuclear stress test on 07/13/13 (Dr. Gibson Ramp) showed: Abnormal Lexiscan MPI demonstrating a large area of inferior, inferolateral, and lateral wall infarct with peri-infarct ischemia.  LV is dilated with severely decreased systolic function and hypokinesis of inferior, inferolateral and lateral walls, calculated EF 36%.   Echo on 07/20/13 (Dr. Gibson Ramp) showed: Normal LV size and mild ot moderately decreased systolic function, EF 17-00%. Moderate concentric LVH. Hypokinesis of the inferolateral and lateral walls and apex. Normal RV size and systolic function. Mild AS (mean gradient 10 mmHg, peak velocity 2.16 m/sec) and mild AI.    Cardiac cath on 07/18/2013 [facility]) showed: RA pressure mildly elevated at 7 mmHg. RV pressure mildly elevated at 30/10 mmHg. PA pressure mildly elevated at 30/10 with a mean of 20 mmHg. PCWP mildly elevated at 15 mmHg. CO 9.0 L/min. CI normal at 3.9 L/min2. RCA  non-dominant. LIMA to mid/distal LAD patent. LM 20-30%. Proximal LAD 95%, mid LAD 70-80%. Large DIAG with 60% ostial and 50% proximal stenosis. Proximal CX 10-20%. 40-50% mid CX. 30% distal CX. OM1 100% occluded with distal vessel filling via left to left collaterals. 40% OM2. PDA coming off of CX with 80% stenosis just prior to the touchdown of the known bypass graft. Known occlusion of SVG to OM and SVG to PDA.  Medical therapy was recommended. Titrate b-blocker to try to increase competitive collateralization to the occluded OM vessel.  CXR on 06/20/14 showed: Stable mild cardiomegaly.  No active lung disease.  Preoperative labs noted.  PLT count 271K. I spoke with Dr. Arlyn Leak nurse Ander Purpura. Dr. Gaynelle Arabian is aware of plans for surgery.  Patient's PLT counts have been very stable on Promacta, so he did not see any contraindication from his standpoint.  LFTS at Dr. Arlyn Leak office on 06/05/14 showed an ALK PH of 58, AST 14, ALT 48, Total bilirubin 0.5.  If no acute changes then I would anticipate that he could proceed as planned.   George Hugh [facility] Short Stay [facility]/Anesthesiology Phone [phone] 06/21/2014 3:13 PM

## 2014-06-21 NOTE — H&P (Signed)
Jeffrey [name] is an 80 y.o. male.   Chief Complaint: left knee pain HPI: Jeffrey [name] is seen today with continued left knee pain. He has had multiple aspirations and injections to his knee. He has had an elevated WBC count in his knee multiple times and had a lab done that showed gout a few weeks ago per his recall. We then received a copy of his labs which demonstrated a bacterial infection a few weeks ago even though our labs did not grow anything. Since then he has had continued pain and stiffness in his knee.  Laboratory data: We spent about an hour trying to retrieve all of his lab results. Most significantly we were able to get an aspirate report from 05/30/14 done up towards [place]. This did show monosodium urate crystals but also showed a cell count of 37,000. The cultures eventually grew coagulase-negative staphylococcus which ended up being resistant to erythromycin and methicillin sensitive to vancomycin, sulfa, and tetracycline. The aspirations that we have done here generally ranged in the 25-30,000 cell count area and have never shown any crystals. They also have never grown anything.   X-ray: We took four views of the left knee. He has advanced bone on bone degenerative change with osteophyte formation and subchondral sclerosis.  Past Medical History  Diagnosis Date  . High cholesterol   . Thyroid disease   . GERD (gastroesophageal reflux disease)   . Hypertension   . Anxiety   . History of heart artery stent   . Coronary artery disease   . Shortness of breath   . Pneumonia   . Hypothyroidism   . H/O hiatal hernia   . Arthritis   . Ischemic cardiomyopathy   . Aortic stenosis     mild AS by 07/20/13 echo ([facility])  . ITP (idiopathic thrombocytopenic purpura)     Dr. Gaynelle Arabian, on [place]    Past Surgical History  Procedure Laterality Date  . Coronary artery bypass graft    . Hernia repair    . Coronary angioplasty    . Appendectomy    . Back surgery       Family History  Problem Relation Age of Onset  . Heart disease    . Arthritis     Social History:  reports that he has quit smoking. He does not have any smokeless tobacco history on file. He reports that he does not drink alcohol or use illicit drugs.  Allergies:  Allergies  Allergen Reactions  . Lipitor [Atorvastatin] Other (See Comments)    cramps  . Methylprednisolone Other (See Comments)    cramps    No prescriptions prior to admission    Results for orders placed during the hospital encounter of 06/20/14 (from the past 48 hour(s))  BASIC METABOLIC PANEL     Status: Abnormal   Collection Time    06/20/14  9:43 AM      Result Value Ref Range   Sodium 141  137 - 147 mEq/L   Potassium 4.4  3.7 - 5.3 mEq/L   Chloride 104  96 - 112 mEq/L   CO2 21  19 - 32 mEq/L   Glucose, Bld 167 (*) 70 - 99 mg/dL   BUN 27 (*) 6 - 23 mg/dL   Creatinine, Ser 1.01  0.50 - 1.35 mg/dL   Calcium 8.7  8.4 - 10.5 mg/dL   GFR calc non Af Amer 70 (*) >90 mL/min   GFR calc Af Amer 81 (*) >90 mL/min  Comment: (NOTE)     The eGFR has been calculated using the CKD EPI equation.     This calculation has not been validated in all clinical situations.     eGFR's persistently <90 mL/min signify possible Chronic Kidney     Disease.  CBC     Status: Abnormal   Collection Time    06/20/14  9:43 AM      Result Value Ref Range   WBC 11.4 (*) 4.0 - 10.5 K/uL   RBC 4.57  4.22 - 5.81 MIL/uL   Hemoglobin 14.1  13.0 - 17.0 g/dL   HCT 42.5  39.0 - 52.0 %   MCV 93.0  78.0 - 100.0 fL   MCH 30.9  26.0 - 34.0 pg   MCHC 33.2  30.0 - 36.0 g/dL   RDW 13.4  11.5 - 15.5 %   Platelets 271  150 - 400 K/uL   Dg Chest 2 View  06/20/2014   CLINICAL DATA:  Preop for left knee arthroscopy  EXAM: CHEST  2 VIEW  COMPARISON:  Chest x-ray of 09/02/2009  FINDINGS: No active infiltrate or effusion is seen. Mediastinal contours are unremarkable. The heart is mildly enlarged and stable. Median sternotomy fixation devices  are present from prior CABG. There are mild degenerative changes in the thoracic spine.  IMPRESSION: Stable mild cardiomegaly.  No active lung disease.   Electronically Signed   By: Ivar Drape M.D.   On: 06/20/2014 09:58    Review of Systems  Musculoskeletal: Positive for joint pain.       Left knee  All other systems reviewed and are negative.   There were no vitals taken for this visit. Physical Exam  Constitutional: He is oriented to person, place, and time. He appears well-developed and well-nourished.  HENT:  Head: Normocephalic and atraumatic.  Eyes: Conjunctivae and EOM are normal. Pupils are equal, round, and reactive to light.  Neck: Normal range of motion.  Cardiovascular: Normal rate and regular rhythm.   Respiratory: Effort normal.  GI: Soft.  Musculoskeletal:  Left knee today has trace effusion with minimal warmth. His motion remains about 10-90. He has medial and lateral joint line pain and some crepitation. Hip motion remains full and straight leg raise is negative. Sensation and motor function are intact in his feet with palpable pulses on both sides. Calves are soft and nontender.   Neurological: He is alert and oriented to person, place, and time.  Skin: Skin is warm and dry.  Psychiatric: He has a normal mood and affect. His behavior is normal. Judgment and thought content normal.     Assessment/Plan Assessment:   Left knee end-stage DJD, gout, and infection  Plan: With the culture report from a few weeks ago up in [place] which shows basically MRSA and the fact that 3 aspirates here in our office have never shown any crystals I think we need to make sure we can eradicate what may be an infection in [place] knee prior to pursuing any sort of joint replacement operation. I reviewed risk of anesthesia and infection related to an arthroscopic irrigation and debridement which we can hopefully take care of later this week. I would like to leave him in the hospital for  a night or 2 with some drains in place. We will consult infectious disease to see what would be appropriate antibiotic coverage and length of treatment.    Cristal Howatt, Larwance Sachs 06/21/2014, 6:02 PM

## 2014-06-22 ENCOUNTER — Encounter (HOSPITAL_COMMUNITY): Payer: Self-pay | Admitting: *Deleted

## 2014-06-22 ENCOUNTER — Encounter (HOSPITAL_COMMUNITY)
Admission: RE | Disposition: A | Discharge status: Home Health | Payer: Self-pay | Source: Ambulatory Visit | Attending: Orthopaedic Surgery

## 2014-06-22 ENCOUNTER — Ambulatory Visit (HOSPITAL_COMMUNITY): Payer: Medicare Other | Admitting: Anesthesiology

## 2014-06-22 ENCOUNTER — Encounter (HOSPITAL_COMMUNITY): Payer: Medicare Other | Admitting: Vascular Surgery

## 2014-06-22 ENCOUNTER — Inpatient Hospital Stay (HOSPITAL_COMMUNITY)
Admission: RE | Admit: 2014-06-22 | Discharge: 2014-06-24 | DRG: 486 | Disposition: A | Discharge status: Home Health | Payer: Medicare Other | Source: Ambulatory Visit | Attending: Orthopaedic Surgery | Admitting: Orthopaedic Surgery

## 2014-06-22 DIAGNOSIS — F411 Generalized anxiety disorder: Secondary | ICD-10-CM | POA: Diagnosis present

## 2014-06-22 DIAGNOSIS — M25469 Effusion, unspecified knee: Secondary | ICD-10-CM

## 2014-06-22 DIAGNOSIS — M009 Pyogenic arthritis, unspecified: Principal | ICD-10-CM | POA: Diagnosis present

## 2014-06-22 DIAGNOSIS — D693 Immune thrombocytopenic purpura: Secondary | ICD-10-CM | POA: Diagnosis present

## 2014-06-22 DIAGNOSIS — Z888 Allergy status to other drugs, medicaments and biological substances status: Secondary | ICD-10-CM

## 2014-06-22 DIAGNOSIS — K219 Gastro-esophageal reflux disease without esophagitis: Secondary | ICD-10-CM | POA: Diagnosis present

## 2014-06-22 DIAGNOSIS — E039 Hypothyroidism, unspecified: Secondary | ICD-10-CM | POA: Diagnosis present

## 2014-06-22 DIAGNOSIS — I2510 Atherosclerotic heart disease of native coronary artery without angina pectoris: Secondary | ICD-10-CM | POA: Diagnosis present

## 2014-06-22 DIAGNOSIS — Z9861 Coronary angioplasty status: Secondary | ICD-10-CM

## 2014-06-22 DIAGNOSIS — Z87891 Personal history of nicotine dependence: Secondary | ICD-10-CM

## 2014-06-22 DIAGNOSIS — F329 Major depressive disorder, single episode, unspecified: Secondary | ICD-10-CM | POA: Diagnosis present

## 2014-06-22 DIAGNOSIS — I252 Old myocardial infarction: Secondary | ICD-10-CM

## 2014-06-22 DIAGNOSIS — F3289 Other specified depressive episodes: Secondary | ICD-10-CM | POA: Diagnosis present

## 2014-06-22 DIAGNOSIS — I2589 Other forms of chronic ischemic heart disease: Secondary | ICD-10-CM | POA: Diagnosis present

## 2014-06-22 DIAGNOSIS — M25569 Pain in unspecified knee: Secondary | ICD-10-CM

## 2014-06-22 DIAGNOSIS — I359 Nonrheumatic aortic valve disorder, unspecified: Secondary | ICD-10-CM | POA: Diagnosis present

## 2014-06-22 DIAGNOSIS — I10 Essential (primary) hypertension: Secondary | ICD-10-CM | POA: Diagnosis present

## 2014-06-22 DIAGNOSIS — M109 Gout, unspecified: Secondary | ICD-10-CM | POA: Diagnosis present

## 2014-06-22 DIAGNOSIS — Z951 Presence of aortocoronary bypass graft: Secondary | ICD-10-CM

## 2014-06-22 DIAGNOSIS — E7800 Pure hypercholesterolemia, unspecified: Secondary | ICD-10-CM | POA: Diagnosis present

## 2014-06-22 HISTORY — DX: Personal history of other specified conditions: Z87.898

## 2014-06-22 HISTORY — DX: Depression, unspecified: F32.A

## 2014-06-22 HISTORY — DX: Acute myocardial infarction, unspecified: I21.9

## 2014-06-22 HISTORY — DX: Major depressive disorder, single episode, unspecified: F32.9

## 2014-06-22 HISTORY — PX: KNEE ARTHROSCOPY: SHX127

## 2014-06-22 LAB — SYNOVIAL CELL COUNT + DIFF, W/ CRYSTALS
Eosinophils-Synovial: 0 % (ref 0–1)
Lymphocytes-Synovial Fld: 4 % (ref 0–20)
Monocyte-Macrophage-Synovial Fluid: 8 % — ABNORMAL LOW (ref 50–90)
Neutrophil, Synovial: 88 % — ABNORMAL HIGH (ref 0–25)
WBC, Synovial: UNDETERMINED /mm3 (ref 0–200)

## 2014-06-22 SURGERY — ARTHROSCOPY, KNEE
Anesthesia: General | Site: Knee | Laterality: Left

## 2014-06-22 MED ORDER — LACTATED RINGERS IV SOLN
INTRAVENOUS | Status: DC
  Administered 2014-06-24: 75 mL/h via INTRAVENOUS

## 2014-06-22 MED ORDER — LIDOCAINE HCL (CARDIAC) 20 MG/ML IV SOLN
INTRAVENOUS | Status: AC
  Filled 2014-06-22: qty 5

## 2014-06-22 MED ORDER — ASPIRIN EC 81 MG PO TBEC
81.0000 mg | DELAYED_RELEASE_TABLET | Freq: Every day | ORAL | Status: DC
  Administered 2014-06-22 – 2014-06-24 (×3): 81 mg via ORAL
  Filled 2014-06-22 (×3): qty 1

## 2014-06-22 MED ORDER — ELTROMBOPAG OLAMINE 25 MG PO TABS: 25.0000 mg | ORAL_TABLET | ORAL | Status: DC

## 2014-06-22 MED ORDER — METOCLOPRAMIDE HCL 5 MG/ML IJ SOLN: 5.0000 mg | Freq: Three times a day (TID) | INTRAMUSCULAR | Status: DC | PRN

## 2014-06-22 MED ORDER — FENTANYL CITRATE 0.05 MG/ML IJ SOLN
INTRAMUSCULAR | Status: AC
  Filled 2014-06-22: qty 5

## 2014-06-22 MED ORDER — FENTANYL CITRATE 0.05 MG/ML IJ SOLN
INTRAMUSCULAR | Status: DC | PRN
  Administered 2014-06-22 (×2): 50 ug via INTRAVENOUS

## 2014-06-22 MED ORDER — LACTATED RINGERS IV SOLN
INTRAVENOUS | Status: DC | PRN
  Administered 2014-06-22: 12:00:00 via INTRAVENOUS

## 2014-06-22 MED ORDER — ONDANSETRON HCL 4 MG/2ML IJ SOLN
INTRAMUSCULAR | Status: AC
  Filled 2014-06-22: qty 2

## 2014-06-22 MED ORDER — ALPRAZOLAM 0.5 MG PO TABS: 0.5000 mg | ORAL_TABLET | Freq: Three times a day (TID) | ORAL | Status: DC | PRN

## 2014-06-22 MED ORDER — SODIUM CHLORIDE 0.9 % IR SOLN
Status: DC | PRN
  Administered 2014-06-22: 13:00:00

## 2014-06-22 MED ORDER — FLUOXETINE HCL 10 MG PO CAPS
10.0000 mg | ORAL_CAPSULE | Freq: Every day | ORAL | Status: DC
  Administered 2014-06-22 – 2014-06-24 (×3): 10 mg via ORAL
  Filled 2014-06-22 (×3): qty 1

## 2014-06-22 MED ORDER — CHLORHEXIDINE GLUCONATE 4 % EX LIQD: 60.0000 mL | Freq: Once | CUTANEOUS | Status: DC

## 2014-06-22 MED ORDER — CEFAZOLIN SODIUM-DEXTROSE 2-3 GM-% IV SOLR
2.0000 g | INTRAVENOUS | Status: AC
  Administered 2014-06-22: 2 g via INTRAVENOUS
  Filled 2014-06-22: qty 50

## 2014-06-22 MED ORDER — SODIUM CHLORIDE 0.9 % IR SOLN
Status: DC | PRN
  Administered 2014-06-22: 3000 mL
  Administered 2014-06-22: 1000 mL
  Administered 2014-06-22: 3000 mL

## 2014-06-22 MED ORDER — HYDROMORPHONE HCL PF 1 MG/ML IJ SOLN
INTRAMUSCULAR | Status: AC
  Filled 2014-06-22: qty 1

## 2014-06-22 MED ORDER — VANCOMYCIN HCL IN DEXTROSE 1-5 GM/200ML-% IV SOLN
1000.0000 mg | Freq: Two times a day (BID) | INTRAVENOUS | Status: DC
  Administered 2014-06-23 – 2014-06-24 (×3): 1000 mg via INTRAVENOUS
  Filled 2014-06-22 (×5): qty 200

## 2014-06-22 MED ORDER — COLCHICINE 0.6 MG PO TABS
0.6000 mg | ORAL_TABLET | Freq: Every day | ORAL | Status: DC
  Administered 2014-06-22 – 2014-06-24 (×3): 0.6 mg via ORAL
  Filled 2014-06-22 (×3): qty 1

## 2014-06-22 MED ORDER — ONDANSETRON HCL 4 MG PO TABS: 4.0000 mg | ORAL_TABLET | Freq: Four times a day (QID) | ORAL | Status: DC | PRN

## 2014-06-22 MED ORDER — METOPROLOL SUCCINATE ER 25 MG PO TB24
25.0000 mg | ORAL_TABLET | Freq: Every day | ORAL | Status: DC
  Administered 2014-06-22 – 2014-06-24 (×3): 25 mg via ORAL
  Filled 2014-06-22 (×3): qty 1

## 2014-06-22 MED ORDER — VANCOMYCIN HCL 10 G IV SOLR
2000.0000 mg | Freq: Once | INTRAVENOUS | Status: AC
  Administered 2014-06-22: 2000 mg via INTRAVENOUS
  Filled 2014-06-22: qty 2000

## 2014-06-22 MED ORDER — HYDROMORPHONE HCL PF 1 MG/ML IJ SOLN
0.2500 mg | INTRAMUSCULAR | Status: DC | PRN
  Administered 2014-06-22 (×2): 0.5 mg via INTRAVENOUS

## 2014-06-22 MED ORDER — OXYCODONE HCL 5 MG/5ML PO SOLN: 5.0000 mg | Freq: Once | ORAL | Status: AC | PRN

## 2014-06-22 MED ORDER — BUPIVACAINE-EPINEPHRINE 0.5% -1:200000 IJ SOLN
INTRAMUSCULAR | Status: DC | PRN
  Administered 2014-06-22: 30 mL

## 2014-06-22 MED ORDER — PROPOFOL 10 MG/ML IV BOLUS
INTRAVENOUS | Status: AC
  Filled 2014-06-22: qty 20

## 2014-06-22 MED ORDER — LOSARTAN POTASSIUM 25 MG PO TABS
25.0000 mg | ORAL_TABLET | Freq: Every day | ORAL | Status: DC
  Administered 2014-06-22 – 2014-06-24 (×3): 25 mg via ORAL
  Filled 2014-06-22 (×3): qty 1

## 2014-06-22 MED ORDER — LIDOCAINE HCL (CARDIAC) 20 MG/ML IV SOLN
INTRAVENOUS | Status: DC | PRN
Start: 2014-06-22 — End: 2014-06-22
  Administered 2014-06-22: 100 mg via INTRAVENOUS

## 2014-06-22 MED ORDER — HYDROCODONE-ACETAMINOPHEN 10-325 MG PO TABS
1.0000 | ORAL_TABLET | Freq: Four times a day (QID) | ORAL | Status: DC | PRN
  Administered 2014-06-23 (×2): 2 via ORAL
  Administered 2014-06-24: 1 via ORAL
  Administered 2014-06-24 (×2): 2 via ORAL
  Filled 2014-06-22: qty 2
  Filled 2014-06-22: qty 1
  Filled 2014-06-22 (×3): qty 2

## 2014-06-22 MED ORDER — CYCLOBENZAPRINE HCL 10 MG PO TABS
10.0000 mg | ORAL_TABLET | Freq: Three times a day (TID) | ORAL | Status: DC | PRN
  Administered 2014-06-23 – 2014-06-24 (×2): 10 mg via ORAL
  Filled 2014-06-22 (×2): qty 1

## 2014-06-22 MED ORDER — DICLOFENAC SODIUM 50 MG PO TBEC
100.0000 mg | DELAYED_RELEASE_TABLET | Freq: Every day | ORAL | Status: DC
  Administered 2014-06-22 – 2014-06-24 (×3): 100 mg via ORAL
  Filled 2014-06-22 (×3): qty 2

## 2014-06-22 MED ORDER — LEVOTHYROXINE SODIUM 175 MCG PO TABS
175.0000 ug | ORAL_TABLET | Freq: Every day | ORAL | Status: DC
Start: 2014-06-23 — End: 2014-06-24
  Administered 2014-06-23 – 2014-06-24 (×2): 175 ug via ORAL
  Filled 2014-06-22 (×3): qty 1

## 2014-06-22 MED ORDER — PANTOPRAZOLE SODIUM 40 MG PO TBEC
80.0000 mg | DELAYED_RELEASE_TABLET | Freq: Every day | ORAL | Status: DC
  Administered 2014-06-22 – 2014-06-24 (×3): 80 mg via ORAL
  Filled 2014-06-22 (×3): qty 2

## 2014-06-22 MED ORDER — BISACODYL 5 MG PO TBEC: 5.0000 mg | DELAYED_RELEASE_TABLET | Freq: Every day | ORAL | Status: DC | PRN

## 2014-06-22 MED ORDER — DOCUSATE SODIUM 100 MG PO CAPS
100.0000 mg | ORAL_CAPSULE | Freq: Two times a day (BID) | ORAL | Status: DC
  Administered 2014-06-22 – 2014-06-24 (×4): 100 mg via ORAL
  Filled 2014-06-22 (×4): qty 1

## 2014-06-22 MED ORDER — MOMETASONE FURO-FORMOTEROL FUM 200-5 MCG/ACT IN AERO
2.0000 | INHALATION_SPRAY | Freq: Two times a day (BID) | RESPIRATORY_TRACT | Status: DC
  Administered 2014-06-22 – 2014-06-24 (×4): 2 via RESPIRATORY_TRACT
  Filled 2014-06-22: qty 8.8

## 2014-06-22 MED ORDER — PROPOFOL 10 MG/ML IV BOLUS
INTRAVENOUS | Status: DC | PRN
  Administered 2014-06-22: 170 mg via INTRAVENOUS

## 2014-06-22 MED ORDER — PROMETHAZINE HCL 25 MG/ML IJ SOLN: 6.2500 mg | INTRAMUSCULAR | Status: DC | PRN

## 2014-06-22 MED ORDER — ONDANSETRON HCL 4 MG/2ML IJ SOLN: 4.0000 mg | Freq: Four times a day (QID) | INTRAMUSCULAR | Status: DC | PRN

## 2014-06-22 MED ORDER — BUPIVACAINE-EPINEPHRINE (PF) 0.5% -1:200000 IJ SOLN
INTRAMUSCULAR | Status: AC
  Filled 2014-06-22: qty 30

## 2014-06-22 MED ORDER — ONDANSETRON HCL 4 MG/2ML IJ SOLN
INTRAMUSCULAR | Status: DC | PRN
  Administered 2014-06-22: 4 mg via INTRAVENOUS

## 2014-06-22 MED ORDER — HYDROMORPHONE HCL PF 1 MG/ML IJ SOLN: 0.5000 mg | INTRAMUSCULAR | Status: DC | PRN

## 2014-06-22 MED ORDER — METOCLOPRAMIDE HCL 10 MG PO TABS: 5.0000 mg | ORAL_TABLET | Freq: Three times a day (TID) | ORAL | Status: DC | PRN

## 2014-06-22 MED ORDER — LACTATED RINGERS IV SOLN
INTRAVENOUS | Status: DC
  Administered 2014-06-22: 08:00:00 via INTRAVENOUS

## 2014-06-22 MED ORDER — OXYCODONE HCL 5 MG PO TABS
ORAL_TABLET | ORAL | Status: AC
  Filled 2014-06-22: qty 1

## 2014-06-22 MED ORDER — OXYCODONE HCL 5 MG PO TABS
5.0000 mg | ORAL_TABLET | Freq: Once | ORAL | Status: AC | PRN
  Administered 2014-06-22: 5 mg via ORAL

## 2014-06-22 MED ORDER — GABAPENTIN 100 MG PO CAPS
100.0000 mg | ORAL_CAPSULE | Freq: Three times a day (TID) | ORAL | Status: DC
  Administered 2014-06-22 – 2014-06-24 (×7): 100 mg via ORAL
  Filled 2014-06-22 (×8): qty 1

## 2014-06-22 SURGICAL SUPPLY — 45 items
BANDAGE ELASTIC 6 VELCRO ST LF (GAUZE/BANDAGES/DRESSINGS) ×3 IMPLANT
BANDAGE GAUZE ELAST BULKY 4 IN (GAUZE/BANDAGES/DRESSINGS) ×3 IMPLANT
BLADE GREAT WHITE 4.2 (BLADE) ×2 IMPLANT
BLADE GREAT WHITE 4.2MM (BLADE) ×1
BLADE SURG 11 STRL SS (BLADE) IMPLANT
COVER SURGICAL LIGHT HANDLE (MISCELLANEOUS) ×3 IMPLANT
CUFF TOURNIQUET SINGLE 34IN LL (TOURNIQUET CUFF) IMPLANT
CUFF TOURNIQUET SINGLE 44IN (TOURNIQUET CUFF) IMPLANT
DRAPE ARTHROSCOPY W/POUCH 114 (DRAPES) ×3 IMPLANT
DRAPE U-SHAPE 47X51 STRL (DRAPES) ×3 IMPLANT
DRSG EMULSION OIL 3X3 NADH (GAUZE/BANDAGES/DRESSINGS) ×3 IMPLANT
DRSG PAD ABDOMINAL 8X10 ST (GAUZE/BANDAGES/DRESSINGS) IMPLANT
DURAPREP 26ML APPLICATOR (WOUND CARE) ×3 IMPLANT
GLOVE BIO SURGEON STRL SZ8 (GLOVE) ×12 IMPLANT
GLOVE BIOGEL PI IND STRL 7.0 (GLOVE) ×1 IMPLANT
GLOVE BIOGEL PI IND STRL 7.5 (GLOVE) ×2 IMPLANT
GLOVE BIOGEL PI IND STRL 8 (GLOVE) ×1 IMPLANT
GLOVE BIOGEL PI INDICATOR 7.0 (GLOVE) ×2
GLOVE BIOGEL PI INDICATOR 7.5 (GLOVE) ×4
GLOVE BIOGEL PI INDICATOR 8 (GLOVE) ×2
GLOVE SS BIOGEL STRL SZ 8 (GLOVE) ×2 IMPLANT
GLOVE SUPERSENSE BIOGEL SZ 8 (GLOVE) ×4
GOWN STRL REUS W/ TWL LRG LVL3 (GOWN DISPOSABLE) ×2 IMPLANT
GOWN STRL REUS W/ TWL XL LVL3 (GOWN DISPOSABLE) ×2 IMPLANT
GOWN STRL REUS W/TWL 2XL LVL3 (GOWN DISPOSABLE) IMPLANT
GOWN STRL REUS W/TWL LRG LVL3 (GOWN DISPOSABLE) ×4
GOWN STRL REUS W/TWL XL LVL3 (GOWN DISPOSABLE) ×4
KIT ROOM TURNOVER OR (KITS) ×3 IMPLANT
MANIFOLD NEPTUNE II (INSTRUMENTS) ×3 IMPLANT
NEEDLE 18GX1X1/2 (RX/OR ONLY) (NEEDLE) IMPLANT
NEEDLE 22X1 1/2 (OR ONLY) (NEEDLE) IMPLANT
NEEDLE SPNL 18GX3.5 QUINCKE PK (NEEDLE) ×3 IMPLANT
PACK ARTHROSCOPY DSU (CUSTOM PROCEDURE TRAY) ×3 IMPLANT
PAD ABD 8X10 STRL (GAUZE/BANDAGES/DRESSINGS) ×3 IMPLANT
PAD ARMBOARD 7.5X6 YLW CONV (MISCELLANEOUS) ×6 IMPLANT
SET ARTHROSCOPY TUBING (MISCELLANEOUS) ×2
SET ARTHROSCOPY TUBING LN (MISCELLANEOUS) ×1 IMPLANT
SPONGE GAUZE 4X4 12PLY (GAUZE/BANDAGES/DRESSINGS) IMPLANT
SPONGE GAUZE 4X4 12PLY STER LF (GAUZE/BANDAGES/DRESSINGS) ×3 IMPLANT
SPONGE LAP 4X18 X RAY DECT (DISPOSABLE) ×3 IMPLANT
STOPCOCK 4 WAY LG BORE MALE ST (IV SETS) ×3 IMPLANT
SYR CONTROL 10ML LL (SYRINGE) ×3 IMPLANT
SYRINGE 10CC LL (SYRINGE) ×3 IMPLANT
TOWEL OR 17X24 6PK STRL BLUE (TOWEL DISPOSABLE) ×6 IMPLANT
WATER STERILE IRR 1000ML POUR (IV SOLUTION) ×3 IMPLANT

## 2014-06-22 NOTE — Anesthesia Postprocedure Evaluation (Signed)
Anesthesia Post Note  Patient: Jeffrey [name]  Procedure(s) Performed: Procedure(s) (LRB): IRRIGATION AND DEBRIDEMENT WITH CHRONDROPLASTY (Left)  Anesthesia type: general  Patient location: PACU  Post pain: Pain level controlled  Post assessment: Patient's Cardiovascular Status Stable  Last Vitals:  Filed Vitals:   06/22/14 1445  BP:   Pulse: 56  Temp:   Resp: 18    Post vital signs: Reviewed and stable  Level of consciousness: sedated  Complications: No apparent anesthesia complications

## 2014-06-22 NOTE — Consult Note (Signed)
[facility] for Infectious Disease     Reason for Consult: septic arthritis    Referring Physician: Dr. Rhona Raider  Principal Problem:   Septic joint of left knee joint   . aspirin EC  81 mg Oral Daily  . colchicine  0.6 mg Oral Daily  . diclofenac  100 mg Oral Daily  . docusate sodium  100 mg Oral BID  . eltrombopag  25 mg Oral Once per day on Mon Thu  . FLUoxetine  10 mg Oral Daily  . gabapentin  100 mg Oral TID  . HYDROmorphone      . [START ON 06/23/2014] levothyroxine  175 mcg Oral QAC breakfast  . losartan  25 mg Oral Daily  . metoprolol succinate  25 mg Oral Daily  . mometasone-formoterol  2 puff Inhalation BID  . oxyCODONE      . pantoprazole  80 mg Oral Daily  . vancomycin  2,000 mg Intravenous Once   Followed by  . [START ON 06/23/2014] vancomycin  1,000 mg Intravenous Q12H    Recommendations: -Vancomycin for 2 weeks, will consider linezolid (though is on a low dose SSRI) or continuing vancomycin after that -picc  Vancomycin per home health protocol with trough goal of 12-15 CBC, cmp after one week to [facility] We will arrange follow up with [place] in 2 weeks  Assessment: He has likely septic arthritis though cell count was not apparently too high and had crystals, it did grow MRSE by report.     Antibiotics: Starting vancomycin  HPI: Jeffrey [name] is a 80 y.o. male with CAD, CABG history who has had several weeks of knee pain and swelling.  Thought to be due to gout with low level WBCs and crystals but did grow MRSE in [place].  Taken to OR today and washed out.  No history of knee surgery.     Review of Systems: A comprehensive review of systems was negative.  Past Medical History  Diagnosis Date  . High cholesterol   . Thyroid disease   . GERD (gastroesophageal reflux disease)   . Hypertension   . Anxiety   . History of heart artery stent   . Coronary artery disease   . Shortness of breath   . Pneumonia   . Hypothyroidism   . H/O hiatal hernia   .  Arthritis   . Ischemic cardiomyopathy   . Aortic stenosis     mild AS by 07/20/13 echo ([facility])  . ITP (idiopathic thrombocytopenic purpura)     Dr. Gaynelle Arabian, on Promacta    History  Substance Use Topics  . Smoking status: Former Research scientist (life sciences)  . Smokeless tobacco: Not on file  . Alcohol Use: No    Family History  Problem Relation Age of Onset  . Heart disease    . Arthritis     Allergies  Allergen Reactions  . Lipitor [Atorvastatin] Other (See Comments)    cramps  . Methylprednisolone Other (See Comments)    cramps    OBJECTIVE: Blood pressure 123/65, pulse 57, temperature 97.3 F (36.3 C), temperature source Oral, resp. rate 18, height 6' (1.829 m), weight 237 lb (107.502 kg), SpO2 95.00%. General: awake, sleepy after surgery Skin: no rashes Lungs: CTA B Cor: RRR Abdomen: soft, nt, nd Ext: wrapped  Microbiology: No results found for this or any previous visit (from the past 240 hour(s)).  Scharlene Gloss, [place] [facility] [place] [facility] [email or web address] [phone] pager  [phone]  cell 06/22/2014, 4:25 PM

## 2014-06-22 NOTE — Transfer of Care (Signed)
Immediate Anesthesia Transfer of Care Note  Patient: Jeffrey [name]  Procedure(s) Performed: Procedure(s): IRRIGATION AND DEBRIDEMENT WITH CHRONDROPLASTY (Left)  Patient Location: PACU  Anesthesia Type:General  Level of Consciousness: awake, alert , oriented and sedated  Airway & Oxygen Therapy: Patient Spontanous Breathing and Patient connected to nasal cannula oxygen  Post-op Assessment: Report given to PACU RN, Post -op Vital signs reviewed and stable and Patient moving all extremities  Post vital signs: Reviewed and stable  Complications: No apparent anesthesia complications

## 2014-06-22 NOTE — Discharge Instructions (Signed)

## 2014-06-22 NOTE — Op Note (Signed)
[name], Jeffrey [name]                ACCOUNT NO.:  [id number]  MEDICAL RECORD NO.:  [id number]  LOCATION:  5N05C                        FACILITY:  [facility]  PHYSICIAN:  Monico Blitz. Zendaya Groseclose, M.D.DATE OF BIRTH:  07-14-1934  DATE OF PROCEDURE:  06/22/2014 DATE OF DISCHARGE:                              OPERATIVE REPORT   PREOPERATIVE DIAGNOSES: 1. Left knee infection. 2. Left knee degenerative joint disease.  POSTOPERATIVE DIAGNOSES: 1. Left knee infection. 2. Left knee degenerative joint disease.  PROCEDURES: 1. Left knee arthroscopic I and D. 2. Left knee abrasion chondroplasty.  ANESTHESIA:  General.  ATTENDING SURGEON:  Monico Blitz. Rhona Raider, M.D.  ASSISTANT:  Loni Dolly, PA.  INDICATION FOR PROCEDURE:  The patient is a 80 year old man with a very long history of a terribly painful left knee.  Over recent months, he has experienced large effusions, which have been aspirated on many occasions.  He is known to have gout, but a recent culture did grow staphylococcus.  This was resistant to some antibiotics, but sensitive to others.  His infection continued and we elected to perform a thorough irrigation and debridement in the hopes of some day replacing his knee. Informed operative consent was obtained after discussion of possible complications including reaction to anesthesia and obviously continued infection.  SUMMARY OF FINDINGS AND PROCEDURE:  Under general anesthesia, an arthroscopy of the left knee was performed.  He had grade 4 change in all 3 compartments.  He had some torn meniscal tissues as well.  I performed some meniscectomies and abrasion chondroplasties then irrigated the knee with 6 L of normal saline and about a L of antibiotic solution.  Some drains were placed and he was admitted to the hospital.  DESCRIPTION OF PROCEDURE:  The patient was taken to the operating suite where general anesthetic was applied without difficulty.  He was positioned supine and prepped  and draped in normal sterile fashion. After the administration of IV Kefzol and an appropriate time-out, an arthroscopy of the left knee was performed through a total of 2 portals. At the start of the case, we aspirated 10 mL of cloudy fluid and sent it to the lab.  I then performed the meniscectomies and abrasions as described above.  The knee was thoroughly irrigated with 6 L of saline and about a L of a double antibiotic solution.  We then placed 2 large Hemovac drains through the portals.  These were connected to a bulb suction.  These were not sewn in place.  We applied dry gauze dressing and a loose Ace wrap.  Estimated blood loss and intraoperative fluids can be obtained from anesthesia records.  DISPOSITION:  The patient was extubated in the operating room and taken to the recovery room in stable condition.  Plans were for him to be admitted to the Orthopedic Surgery Service with an Infectious Disease consult.  Our hope is to clear this infection in hopes of one-day setting up a total knee replacement.  We will need some help on the antibiotic choice and duration from the Infectious Disease team.     Monico Blitz. Rhona Raider, M.D.     PGD/MEDQ  D:  06/22/2014  T:  06/22/2014  Job:  [id number]

## 2014-06-22 NOTE — Progress Notes (Signed)
ANTIBIOTIC CONSULT NOTE - INITIAL  Pharmacy Consult for Vancomycin Indication: septic arthritis  Allergies  Allergen Reactions  . Lipitor [Atorvastatin] Other (See Comments)    cramps  . Methylprednisolone Other (See Comments)    cramps    Patient Measurements: Weight: 237 lb (107.502 kg)  Vital Signs: Temp: 97.7 F (36.5 C) (07/02 1448) Temp src: Oral (07/02 0713) BP: 134/68 mmHg (07/02 1439) Pulse Rate: 56 (07/02 1445) Intake/Output from previous day:   Intake/Output from this shift: Total I/O In: 725 [I.V.:725] Out: 75 [Blood:75]  Labs:  Recent Labs  06/20/14 0943  WBC 11.4*  HGB 14.1  PLT 271  CREATININE 1.01   The CrCl is unknown because both a height and weight (above a minimum accepted value) are required for this calculation.    Microbiology: No results found for this or any previous visit (from the past 720 hour(s)).  Medical History: Past Medical History  Diagnosis Date  . High cholesterol   . Thyroid disease   . GERD (gastroesophageal reflux disease)   . Hypertension   . Anxiety   . History of heart artery stent   . Coronary artery disease   . Shortness of breath   . Pneumonia   . Hypothyroidism   . H/O hiatal hernia   . Arthritis   . Ischemic cardiomyopathy   . Aortic stenosis     mild AS by 07/20/13 echo ([facility])  . ITP (idiopathic thrombocytopenic purpura)     Dr. Gaynelle Arabian, on Promacta    Medications:  Prescriptions prior to admission  Medication Sig Dispense Refill  . ALPRAZolam (XANAX) 0.5 MG tablet Take 0.5 mg by mouth 3 (three) times daily as needed for anxiety.       [name] Kitchen aspirin EC 81 MG tablet Take 81 mg by mouth daily.      . cephALEXin (KEFLEX) 250 MG capsule Take 250 mg by mouth 4 (four) times daily.      . colchicine 0.6 MG tablet Take 0.6 mg by mouth daily.      . cyclobenzaprine (FLEXERIL) 10 MG tablet Take 10 mg by mouth 3 (three) times daily as needed for muscle spasms.       . diclofenac  (VOLTAREN) 50 MG EC tablet Take 100 mg by mouth daily.      [name] Kitchen eltrombopag (PROMACTA) 25 MG tablet Take 25 mg by mouth 2 (two) times a week. Take on an empty stomach, 1 hour before a meal or 2 hours after. On Monday and Fridays      . FLUoxetine (PROZAC) 10 MG capsule Take 10 mg by mouth daily.      . fluticasone-salmeterol (ADVAIR HFA) 230-21 MCG/ACT inhaler Inhale 1 puff into the lungs 2 (two) times daily.       [name] Kitchen gabapentin (NEURONTIN) 100 MG capsule Take 100 mg by mouth 3 (three) times daily.      . Hydrocodone-Acetaminophen 10-300 MG TABS Take 1 tablet by mouth at bedtime.      [name] Kitchen levothyroxine (SYNTHROID, LEVOTHROID) 175 MCG tablet Take 175 mcg by mouth daily before breakfast.      . losartan (COZAAR) 25 MG tablet Take 25 mg by mouth daily.      . metoprolol succinate (TOPROL-XL) 25 MG 24 hr tablet Take 25 mg by mouth daily.      [name] Kitchen omeprazole (PRILOSEC) 40 MG capsule Take 40 mg by mouth daily.       Assessment: 80 yo M who was seen by outpt ortho MD  7/1 for continued pain and stiffness in his knee.  Pt has had multiple aspirations to his knee, one with CoNS, one with crystals consistent with gout.  MD was concerned about infection and therefor pt was admitted 7/2 for L knee I&D to remove any signs of infection prior to knee replacement surgery.  ID consult pending to address abx choice and length of therapy.  Goal of Therapy:  Vancomycin trough level 15-20 mcg/ml  Plan:  Vancomycin 2gm IV x 1 dose Vancomycin 1gm IV q12h Follow up culture data, renal function, and length of therapy. Vancomycin trough as indicated.  [employer], Pharm.D., BCPS Clinical Pharmacist Pager [phone] 06/22/2014 3:09 PM

## 2014-06-22 NOTE — Anesthesia Procedure Notes (Signed)
Procedure Name: LMA Insertion Date/Time: 06/22/2014 12:58 PM Performed by: Scheryl Darter Pre-anesthesia Checklist: Patient identified, Emergency Drugs available, Suction available, Patient being monitored and Timeout performed Patient Re-evaluated:Patient Re-evaluated prior to inductionOxygen Delivery Method: Circle system utilized Preoxygenation: Pre-oxygenation with 100% oxygen Intubation Type: IV induction Ventilation: Mask ventilation without difficulty LMA: LMA inserted LMA Size: 5.0 Number of attempts: 1 Placement Confirmation: positive ETCO2 and breath sounds checked- equal and bilateral Tube secured with: Tape Dental Injury: Teeth and Oropharynx as per pre-operative assessment

## 2014-06-22 NOTE — Interval H&P Note (Signed)
History and Physical Interval Note:  06/22/2014 12:19 PM  Jeffrey [name]  has presented today for surgery, with the diagnosis of infected left knee  The various methods of treatment have been discussed with the patient and family. After consideration of risks, benefits and other options for treatment, the patient has consented to  Procedure(s): ARTHROSCOPY LEFT KNEE WITH IRRIGATION AND DEBRIDEMENT (Left) as a surgical intervention .  The patient's history has been reviewed, patient examined, no change in status, stable for surgery.  I have reviewed the patient's chart and labs.  Questions were answered to the patient's satisfaction.     [name]

## 2014-06-22 NOTE — Anesthesia Preprocedure Evaluation (Signed)
Anesthesia Evaluation  Patient identified by MRN, date of birth, ID band Patient awake    Reviewed: Allergy & Precautions, H&P , NPO status , Patient's Chart, lab work & pertinent test results  History of Anesthesia Complications Negative for: history of anesthetic complications  Airway Mallampati: II TM Distance: >3 FB Neck ROM: Full    Dental  (+) Teeth Intact, Dental Advisory Given   Pulmonary pneumonia -, former smoker,    Pulmonary exam normal       Cardiovascular hypertension, + CAD     Neuro/Psych Anxiety negative neurological ROS     GI/Hepatic Neg liver ROS, hiatal hernia, GERD-  ,  Endo/Other  Hypothyroidism   Renal/GU negative Renal ROS     Musculoskeletal   Abdominal   Peds  Hematology   Anesthesia Other Findings   Reproductive/Obstetrics                           Anesthesia Physical Anesthesia Plan  ASA: III  Anesthesia Plan: General   Post-op Pain Management:    Induction: Intravenous  Airway Management Planned: LMA  Additional Equipment:   Intra-op Plan:   Post-operative Plan: Extubation in OR  Informed Consent: I have reviewed the patients History and Physical, chart, labs and discussed the procedure including the risks, benefits and alternatives for the proposed anesthesia with the patient or authorized representative who has indicated his/her understanding and acceptance.   Dental advisory given  Plan Discussed with: CRNA, Anesthesiologist and Surgeon  Anesthesia Plan Comments:         Anesthesia Quick Evaluation

## 2014-06-22 NOTE — Brief Op Note (Signed)
Jeffrey [name] [id number] 06/22/2014   PRE-OP DIAGNOSIS: left knee DJD and infection  POST-OP DIAGNOSIS: same  PROCEDURE: Left knee I&D and debridement  ANESTHESIA: general  Favor Hackler G   Dictation #:  [id number]

## 2014-06-23 DIAGNOSIS — B957 Other staphylococcus as the cause of diseases classified elsewhere: Secondary | ICD-10-CM

## 2014-06-23 DIAGNOSIS — M009 Pyogenic arthritis, unspecified: Principal | ICD-10-CM

## 2014-06-23 MED ORDER — SODIUM CHLORIDE 0.9 % IJ SOLN
10.0000 mL | Freq: Two times a day (BID) | INTRAMUSCULAR | Status: DC
Start: 2014-06-23 — End: 2014-06-24
  Administered 2014-06-23: 10 mL

## 2014-06-23 MED ORDER — SODIUM CHLORIDE 0.9 % IJ SOLN
10.0000 mL | INTRAMUSCULAR | Status: DC | PRN
  Administered 2014-06-24: 10 mL

## 2014-06-23 NOTE — Progress Notes (Signed)
Peripherally Inserted Central Catheter/Midline Placement  The IV Nurse has discussed with the patient and/or persons authorized to consent for the patient, the purpose of this procedure and the potential benefits and risks involved with this procedure.  The benefits include less needle sticks, lab draws from the catheter and patient may be discharged home with the catheter.  Risks include, but not limited to, infection, bleeding, blood clot (thrombus formation), and puncture of an artery; nerve damage and irregular heat beat.  Alternatives to this procedure were also discussed.  PICC/Midline Placement Documentation  PICC / Midline Single Lumen 59/93/57 PICC Right Basilic 42 cm 0 cm (Active)  Indication for Insertion or Continuance of Line Home intravenous therapies (PICC only) 06/23/2014 10:25 AM  Exposed Catheter (cm) 0 cm 06/23/2014 10:25 AM  Line Status Flushed;Saline locked;Blood return noted 06/23/2014 10:25 AM  Dressing Change Due 06/30/14 06/23/2014 10:25 AM       [place] Payment 06/23/2014, 10:37 AM

## 2014-06-23 NOTE — Progress Notes (Signed)
    Patient doing well Minimal left knee pain Getting PICC shortly   Physical Exam: Filed Vitals:   06/23/14 0637  BP: 105/51  Pulse: 61  Temp: 97.7 F (36.5 C)  Resp: 18    Dressing in place NVI  Drain output not recorded  POD #1 s/p I and D left knee  - up with PT/OT, encourage ambulation - WBAT - ID following (on vanc) - d/c drain tomorrow, likely d/c tomorrow if ID is in agreement

## 2014-06-23 NOTE — Care Management Note (Addendum)
CARE MANAGEMENT NOTE 06/23/2014  Patient:  ALF, DOYLE   Account Number:  [id number]  Date Initiated:  06/23/2014  Documentation initiated by:  Ricki Miller  Subjective/Objective Assessment:   80 yr old male s/p left knee I & D and debridement.     Action/Plan:   Patient will go home on IV antibiotics. Case Manager spoke with patient and wife concerning home health needs. Choice offered. Referral called to [facility] [place].   Anticipated DC Date:  06/23/2014   Anticipated DC Plan:  [place]  CM consult      [facility] Choice  HOME HEALTH   Choice offered to / List presented to:  C-3 Spouse      HH arranged  HH-1 RN  IV Antibiotics      [place].   Status of service:  Completed, signed off Medicare Important Message given?  YES (If response is "NO", the following Medicare IM given date fields will be blank) Date Medicare IM given:  06/23/2014 Medicare IM given by:   Date Additional Medicare IM given:   Additional Medicare IM given by:    Discharge Disposition:  [place]  Per UR Regulation:  Reviewed for med. necessity/level of care/duration of stay   06/24/14  Per Pharmacy: Continue patient on Vancomycin 1gm IV q12h. If patient is discharged this weekend, would recommend home health obtain a Vancomycin trough level and BMET Monday 7/6 and weekly thereafter. Horton Chin, PharmD.

## 2014-06-23 NOTE — Progress Notes (Signed)
    [facility] for Infectious Disease  Date of Admission:  06/22/2014  Antibiotics: Vancomycin day 2  Subjective: No acute issues  Objective: Temp:  [97.3 F (36.3 C)-97.7 F (36.5 C)] 97.7 F (36.5 C) (07/03 0637) Pulse Rate:  [55-61] 61 (07/03 0637) Resp:  [8-19] 18 (07/03 0637) BP: (105-137)/(51-74) 105/51 mmHg (07/03 0637) SpO2:  [93 %-99 %] 98 % (07/03 0919)    Lab Results Lab Results  Component Value Date   WBC 11.4* 06/20/2014   HGB 14.1 06/20/2014   HCT 42.5 06/20/2014   MCV 93.0 06/20/2014   PLT 271 06/20/2014    Lab Results  Component Value Date   CREATININE 1.01 06/20/2014   BUN 27* 06/20/2014   NA 141 06/20/2014   K 4.4 06/20/2014   CL 104 06/20/2014   CO2 21 06/20/2014    Lab Results  Component Value Date   ALT 31 01/26/2014   AST 19 01/26/2014   ALKPHOS 44 01/26/2014   BILITOT 0.7 01/26/2014      Microbiology: Recent Results (from the past 240 hour(s))  BODY FLUID CULTURE     Status: None   Collection Time    06/22/14  1:09 PM      Result Value Ref Range Status   Specimen Description SYNOVIAL FLUID KNEE LEFT   Final   Special Requests 9ML FLUID   Final   Gram Stain     Final   Value: WBC PRESENT,BOTH PMN AND MONONUCLEAR     NO ORGANISMS SEEN     Performed at [facility]   Culture     Final   Value: NO GROWTH     Performed at [facility]   Report Status PENDING   Incomplete    Studies/Results: No results found.  Assessment/Plan: 1) Septic arthritis - moderate amount of wbcs, grew MRSE in [place].  Will get vancomycin through at least July 15th.   OK from ID standpoint for d/c.   Thanks for consult  Scharlene Gloss, [place] for Infectious Disease [place] [email or web address] [phone] pager   [phone] cell 06/23/2014, 2:00 PM

## 2014-06-24 LAB — BASIC METABOLIC PANEL
ANION GAP: 11 (ref 5–15)
BUN: 19 mg/dL (ref 6–23)
CO2: 28 mEq/L (ref 19–32)
Calcium: 8.9 mg/dL (ref 8.4–10.5)
Chloride: 99 mEq/L (ref 96–112)
Creatinine, Ser: 1.34 mg/dL (ref 0.50–1.35)
GFR calc non Af Amer: 49 mL/min — ABNORMAL LOW (ref >90)
GFR, EST AFRICAN AMERICAN: 57 mL/min — AB (ref >90)
Glucose, Bld: 123 mg/dL — ABNORMAL HIGH (ref 70–99)
POTASSIUM: 4.5 meq/L (ref 3.7–5.3)
SODIUM: 138 meq/L (ref 137–147)

## 2014-06-24 MED ORDER — VANCOMYCIN HCL IN DEXTROSE 1-5 GM/200ML-% IV SOLN: 1000.0000 mg | Freq: Two times a day (BID) | INTRAVENOUS | Status: DC

## 2014-06-24 MED ORDER — HEPARIN SOD (PORK) LOCK FLUSH 100 UNIT/ML IV SOLN
250.0000 [IU] | INTRAVENOUS | Status: AC | PRN
  Administered 2014-06-24: 250 [IU]

## 2014-06-24 NOTE — Progress Notes (Signed)
[facility]  Patient Status: New pt this admission for [facility]  [facility] is providing the following services: HHRN and Home infusion pharmacy for home IV ABX.  Teaching provided with wife and grandson, Cheri Rous, on 06-23-14 in preparation for DC home on IV ABX.  Both did very well.  Yolanda Bonine is EMT.  [facility] will follow pt and support DC home when deemed appropriate.  [facility] is prepared for weekend DC.   If patient discharges after hours, please call [phone].   Larry Sierras 06/24/2014, 7:55 AM

## 2014-06-24 NOTE — Progress Notes (Signed)
Patient doing well  Minimal left knee pain  Got PICC yesterday Patient states that he has not yet had any PT since surgery  Physical Exam:   BP 120/67  Pulse 63  Temp(Src) 97.5 F (36.4 C) (Oral)  Resp 16  Ht 6' (1.829 m)  Wt 237 lb (107.502 kg)  BMI 32.14 kg/m2  SpO2 95%   Dressing in place  NVI   Drain output 150cc --> d/c'ed  POD #2 s/p I and D left knee   - up with PT/OT, patient needs this done today, and needs to walk a bit prior to discharge - WBAT  - ID following (on vanc). Per ID, patient can discharged today - d/c'ed drain - will d/c patient today - wife has many concerns with regards to giving abx and managing PICC. I will see if someone can talk with her more about this

## 2014-06-24 NOTE — Progress Notes (Signed)
D/C instructions and scripts given. Pt verbalized understanding of home care. [facility] set up to [place]. Rewrapped Pts knee incision and gave Pt supplies to change dressing at home. Pts wife is a the bedside to transport Pt home at this time.

## 2014-06-24 NOTE — Evaluation (Signed)
Physical Therapy Evaluation Patient Details Name: Jeffrey [name] MRN: [id number] DOB: 1934-09-14 Today's Date: 06/24/2014   History of Present Illness  pt presents post L knee scope and I+D.    Clinical Impression  Pt moving well and is ready for D/C from PT stand point.  Spoke with RN about DME needed for home.  No further PT needs at this time.  May need to consider OPPT in future.  Will sign off.      Follow Up Recommendations No PT follow up;Supervision - Intermittent    Equipment Recommendations  Rolling walker with 5" wheels;3in1 (PT)    Recommendations for Other Services       Precautions / Restrictions Precautions Precautions: None Restrictions Weight Bearing Restrictions: Yes LLE Weight Bearing: Weight bearing as tolerated      Mobility  Bed Mobility Overal bed mobility: Needs Assistance Bed Mobility: Supine to Sit     Supine to sit: Supervision     General bed mobility comments: pt moves slowly, but is able to complete without physical A.    Transfers Overall transfer level: Needs assistance Equipment used: Rolling walker (2 wheeled) Transfers: Sit to/from Stand Sit to Stand: Supervision         General transfer comment: cues for UE use and contorlling descent to sitting.    Ambulation/Gait Ambulation/Gait assistance: Supervision Ambulation Distance (Feet): 200 Feet Assistive device: Rolling walker (2 wheeled) Gait Pattern/deviations: Step-through pattern;Decreased step length - right;Decreased stance time - left     General Gait Details: cues for positioning within RW and upright posture.    Stairs Stairs: Yes Stairs assistance: Min guard Stair Management: No rails;Forwards;With walker Number of Stairs: 1 General stair comments: pt states he has deep stairs and has been using a SW at home on stairs.    Wheelchair Mobility    Modified Rankin (Stroke Patients Only)       Balance                                              Pertinent Vitals/Pain 2/10.  Premedicated.      Home Living Family/patient expects to be discharged to:: Private residence Living Arrangements: Spouse/significant other Available Help at Discharge: Family;Available PRN/intermittently Type of Home: House Home Access: Stairs to enter Entrance Stairs-Rails: None Entrance Stairs-Number of Steps: 3 Home Layout: One level Home Equipment: Walker - standard      Prior Function Level of Independence: Independent with assistive device(s)               Hand Dominance        Extremity/Trunk Assessment   Upper Extremity Assessment: Overall WFL for tasks assessed           Lower Extremity Assessment: LLE deficits/detail   LLE Deficits / Details: Strength and ROM limited post-op.  AROM 10-80.       Communication   Communication: No difficulties  Cognition Arousal/Alertness: Awake/alert Behavior During Therapy: WFL for tasks assessed/performed Overall Cognitive Status: Within Functional Limits for tasks assessed                      General Comments      Exercises General Exercises - Lower Extremity Long Arc Quad: AROM;Left;10 reps Heel Slides: AROM;Left;10 reps      Assessment/Plan    PT Assessment Patent does not need any further PT services  PT Diagnosis     PT Problem List    PT Treatment Interventions     PT Goals (Current goals can be found in the Care Plan section) Acute Rehab PT Goals PT Goal Formulation: No goals set, d/c therapy    Frequency     Barriers to discharge        Co-evaluation               End of Session Equipment Utilized During Treatment: Gait belt Activity Tolerance: Patient tolerated treatment well Patient left: in chair;with call bell/phone within reach;with family/visitor present Nurse Communication: Mobility status         Time: 9563-8756 PT Time Calculation (min): 23 min   Charges:   PT Evaluation $Initial PT Evaluation Tier I: 1  Procedure PT Treatments $Gait Training: 8-22 mins   PT G CodesCatarina Hartshorn, [place] 06/24/2014, 12:33 PM

## 2014-06-24 NOTE — Progress Notes (Signed)
ANTIBIOTIC CONSULT NOTE - FOLLOW UP  Pharmacy Consult for Vancomycin Indication: septic arthritis  Allergies  Allergen Reactions  . Lipitor [Atorvastatin] Other (See Comments)    cramps  . Methylprednisolone Other (See Comments)    cramps    Patient Measurements: Height: 6' (182.9 cm) Weight: 237 lb (107.502 kg) IBW/kg (Calculated) : 77.6  Vital Signs: Temp: 97.5 F (36.4 C) (07/04 0534) Temp src: Oral (07/04 0534) BP: 120/67 mmHg (07/04 0534) Pulse Rate: 63 (07/04 0534) Intake/Output from previous day: 07/03 0701 - 07/04 0700 In: 1320 [P.O.:1320] Out: 700 [Urine:550; Drains:150] Intake/Output from this shift:    Labs:  Recent Labs  06/24/14 0450  CREATININE 1.34   Estimated Creatinine Clearance: 58.5 ml/min (by C-G formula based on Cr of 1.34).    Microbiology: Recent Results (from the past 720 hour(s))  BODY FLUID CULTURE     Status: None   Collection Time    06/22/14  1:09 PM      Result Value Ref Range Status   Specimen Description SYNOVIAL FLUID KNEE LEFT   Final   Special Requests 9ML FLUID   Final   Gram Stain     Final   Value: WBC PRESENT,BOTH PMN AND MONONUCLEAR     NO ORGANISMS SEEN     Performed at [facility]   Culture     Final   Value: NO GROWTH     Performed at [facility]   Report Status PENDING   Incomplete    Vancomycin 7/2 >>  Assessment: 80 yo M with septic arthritis now s/p I&D of L knee.  Synovial fluid cx pending.  Per ID recs, plan for IV Vancomycin to continue at least through July 15th.  Pt has PICC line and plans for IV antibiotics in place.    Noted SCr has increased slightly from baseline 1.01 >> 1.34.  Would recommend weekly BMET to be drawn by home health.  Goal of Therapy:  Vancomycin trough level 12-15 mcg/ml (per ID recs)  Plan:  Continue Vancomycin 1gm IV q12h If patient is discharged this weekend, would recommend home health obtain a Vancomycin trough level and BMET Monday 7/6 and weekly  thereafter.  [employer], Pharm.D., BCPS Clinical Pharmacist Pager [phone] 06/24/2014 8:11 AM

## 2014-06-25 LAB — BODY FLUID CULTURE: Culture: NO GROWTH

## 2014-06-26 ENCOUNTER — Telehealth: Payer: Self-pay | Admitting: Orthopedic Surgery

## 2014-06-26 ENCOUNTER — Encounter (HOSPITAL_COMMUNITY): Payer: Self-pay | Admitting: Orthopaedic Surgery

## 2014-06-26 NOTE — Telephone Encounter (Signed)
Patient had called and left a voice message today, 06/26/14, requesting that we cancel his upcoming appointment for 06/29/14 stating "don't need the appointment." - I called back to patient, and offered re-schedule of appointment, and patient states he had to see someone else, and that he had something in the knee that had to be treated right away. He said when he called last time, he couldn't get right in.  I relayed and reviewed notes, at which time we advised evaluation by primary care or Emergency room, due to re-swelling of the knee, and which patient had not elected to do at the time.  He had proceeded to schedule for next available appointment for re-evaluation of the knee, and states that now, he no longer needs the appointment, as said he had to "get with someone else."  He thanked [place], and said would let us know if anything is needed.

## 2014-06-27 NOTE — Discharge Summary (Signed)
Patient ID: Jeffrey [name] MRN: [id number] DOB/AGE: 78-Sep-1935 78 y.o.  Admit date: 06/22/2014 Discharge date: 06/24/2014  Admission Diagnoses:  Principal Problem:   Septic joint of left knee joint   Discharge Diagnoses:  Same  Past Medical History  Diagnosis Date  . High cholesterol   . GERD (gastroesophageal reflux disease)   . Anxiety   . Coronary artery disease   . Shortness of breath   . Hypothyroidism   . H/O hiatal hernia   . Ischemic cardiomyopathy   . Aortic stenosis     mild AS by 07/20/13 echo ([facility])  . ITP (idiopathic thrombocytopenic purpura)     Dr. Gaynelle Arabian, on [place]  . Myocardial infarction 2010-03-24  . Pneumonia 1940's X 1; 2014-03-24 X 1  . History of blood transfusion reaction 24-Mar-2013    "he almost died; he has to get irradiated blood next time"  . Arthritis     "all over"  . Depression     Surgeries: Procedure(s): IRRIGATION AND DEBRIDEMENT WITH CHRONDROPLASTY on 06/22/2014   Consultants:    Discharged Condition: Improved  Hospital Course: Jeffrey [name] is an 80 y.o. male who was admitted 06/22/2014 for operative treatment ofSeptic joint of left knee joint. Patient has severe unremitting pain that affects sleep, daily activities, and work/hobbies. After pre-op clearance the patient was taken to the operating room on 06/22/2014 and underwent  Procedure(s): [place].    Patient was given perioperative antibiotics:  Anti-infectives   Start     Dose/Rate Route Frequency Ordered Stop   06/24/14 0000  vancomycin (VANCOCIN) 1 GM/200ML SOLN     1,000 mg 200 mL/hr over 60 Minutes Intravenous Every 12 hours 06/24/14 1233     06/23/14 0600  vancomycin (VANCOCIN) IVPB 1000 mg/200 mL premix  Status:  Discontinued     1,000 mg 200 mL/hr over 60 Minutes Intravenous Every 12 hours 06/22/14 1514 06/24/14 1941   06/22/14 1600  vancomycin (VANCOCIN) 2,000 mg in sodium chloride 0.9 % 500 mL IVPB     2,000 mg 250 mL/hr  over 120 Minutes Intravenous  Once 06/22/14 1514 06/22/14 1828   06/22/14 1238  polymyxin B 500,000 Units, bacitracin 50,000 Units in sodium chloride irrigation 0.9 % 500 mL irrigation  Status:  Discontinued       As needed 06/22/14 1238 06/22/14 1350   06/22/14 0711  ceFAZolin (ANCEF) IVPB 2 g/50 mL premix     2 g 100 mL/hr over 30 Minutes Intravenous On call to O.R. 06/22/14 0711 06/22/14 1300       Patient was given sequential compression devices, early ambulation, and chemoprophylaxis to prevent DVT.  Patient benefited maximally from hospital stay and there were no complications.    Recent vital signs: No data found.    Recent laboratory studies: No results found for this basename: WBC, HGB, HCT, PLT, NA, K, CL, CO2, BUN, CREATININE, GLUCOSE, PT, INR, CALCIUM, 2,  in the last 72 hours   Discharge Medications:     Medication List    STOP taking these medications       cephALEXin 250 MG capsule  Commonly known as:  KEFLEX      TAKE these medications       ALPRAZolam 0.5 MG tablet  Commonly known as:  XANAX  Take 0.5 mg by mouth 3 (three) times daily as needed for anxiety.     aspirin EC 81 MG tablet  Take 81 mg by mouth daily.  colchicine 0.6 MG tablet  Take 0.6 mg by mouth daily.     cyclobenzaprine 10 MG tablet  Commonly known as:  FLEXERIL  Take 10 mg by mouth 3 (three) times daily as needed for muscle spasms.     diclofenac 50 MG EC tablet  Commonly known as:  VOLTAREN  Take 100 mg by mouth daily.     eltrombopag 25 MG tablet  Commonly known as:  PROMACTA  Take 25 mg by mouth 2 (two) times a week. Take on an empty stomach, 1 hour before a meal or 2 hours after.     FLUoxetine 10 MG capsule  Commonly known as:  PROZAC  Take 10 mg by mouth daily.     fluticasone-salmeterol 230-21 MCG/ACT inhaler  Commonly known as:  ADVAIR HFA  Inhale 1 puff into the lungs 2 (two) times daily.     gabapentin 100 MG capsule  Commonly known as:  NEURONTIN  Take 100  mg by mouth 3 (three) times daily.     Hydrocodone-Acetaminophen 10-300 MG Tabs  Take 1 tablet by mouth at bedtime.     levothyroxine 175 MCG tablet  Commonly known as:  SYNTHROID, LEVOTHROID  Take 175 mcg by mouth daily before breakfast.     losartan 25 MG tablet  Commonly known as:  COZAAR  Take 25 mg by mouth daily.     metoprolol succinate 25 MG 24 hr tablet  Commonly known as:  TOPROL-XL  Take 25 mg by mouth daily.     omeprazole 40 MG capsule  Commonly known as:  PRILOSEC  Take 40 mg by mouth daily.     vancomycin 1 GM/200ML Soln  Commonly known as:  VANCOCIN  Inject 200 mLs (1,000 mg total) into the vein every 12 (twelve) hours.        Diagnostic Studies: Dg Chest 2 View  06/20/2014   CLINICAL DATA:  Preop for left knee arthroscopy  EXAM: CHEST  2 VIEW  COMPARISON:  Chest x-ray of 09/02/2009  FINDINGS: No active infiltrate or effusion is seen. Mediastinal contours are unremarkable. The heart is mildly enlarged and stable. Median sternotomy fixation devices are present from prior CABG. There are mild degenerative changes in the thoracic spine.  IMPRESSION: Stable mild cardiomegaly.  No active lung disease.   Electronically Signed   By: Ivar Drape M.D.   On: 06/20/2014 09:58    Disposition: 01-Home or Self Care      Discharge Instructions   Discharge patient    Complete by:  As directed            Follow-up Information   Follow up with DALLDORF,PETER G, MD. Call in 1 week.   Specialty:  Orthopedic Surgery   Contact information:   [place] [address] [phone]       Follow up with [facility]. (Someone from [facility] will contact you concerning start time for [facility])    Contact information:   [address] [place] [address] [phone]        Signed: Rich Fuchs 06/27/2014, 3:06 PM

## 2014-06-29 ENCOUNTER — Ambulatory Visit: Payer: Medicare Other | Admitting: Orthopedic Surgery

## 2014-07-07 ENCOUNTER — Ambulatory Visit (INDEPENDENT_AMBULATORY_CARE_PROVIDER_SITE_OTHER): Payer: Medicare Other | Admitting: Internal Medicine

## 2014-07-07 ENCOUNTER — Telehealth: Payer: Self-pay | Admitting: *Deleted

## 2014-07-07 VITALS — BP 128/77 | HR 67 | Temp 97.8°F | Ht 73.0 in | Wt 231.0 lb

## 2014-07-07 DIAGNOSIS — M009 Pyogenic arthritis, unspecified: Secondary | ICD-10-CM

## 2014-07-07 DIAGNOSIS — Z1629 Resistance to other single specified antibiotic: Secondary | ICD-10-CM

## 2014-07-07 DIAGNOSIS — I25810 Atherosclerosis of coronary artery bypass graft(s) without angina pectoris: Secondary | ICD-10-CM

## 2014-07-07 DIAGNOSIS — A498 Other bacterial infections of unspecified site: Secondary | ICD-10-CM

## 2014-07-07 DIAGNOSIS — B957 Other staphylococcus as the cause of diseases classified elsewhere: Secondary | ICD-10-CM

## 2014-07-07 NOTE — Progress Notes (Signed)
Subjective:    Patient ID: Jeffrey [name], male    DOB: 06-Sep-1934, 80 y.o.   MRN: [id number]  HPI  80 year old man with recurrent left knee effusion with repeat aspiration. He is known to have gout, but a recent culture did grow MRSE from clinic in [place], [place]. He was admitted on July 2nd since his infection continued and we elected to perform a thorough irrigation and debridement in the hopes of some day replacing his knee. He was discharged on vancomycin for septic arthritis to treat MRSE. Repeat OR cx had NGTD. He is currently on day 15 of 28 without any complications with infusion. His dressing to picc line somewhat worn today. The swelling in his left knee is much improved. No drainage from port sites  Current Outpatient Prescriptions on File Prior to Visit  Medication Sig Dispense Refill  . ALPRAZolam (XANAX) 0.5 MG tablet Take 0.5 mg by mouth 3 (three) times daily as needed for anxiety.       . colchicine 0.6 MG tablet Take 0.6 mg by mouth daily.      . cyclobenzaprine (FLEXERIL) 10 MG tablet Take 10 mg by mouth 3 (three) times daily as needed for muscle spasms.       . diclofenac (VOLTAREN) 50 MG EC tablet Take 100 mg by mouth daily.      [name] Kitchen eltrombopag (PROMACTA) 25 MG tablet Take 25 mg by mouth 2 (two) times a week. Take on an empty stomach, 1 hour before a meal or 2 hours after.      [name] Kitchen FLUoxetine (PROZAC) 10 MG capsule Take 10 mg by mouth daily.      . fluticasone-salmeterol (ADVAIR HFA) 230-21 MCG/ACT inhaler Inhale 1 puff into the lungs 2 (two) times daily.       [name] Kitchen gabapentin (NEURONTIN) 100 MG capsule Take 100 mg by mouth 3 (three) times daily.      . Hydrocodone-Acetaminophen 10-300 MG TABS Take 1 tablet by mouth at bedtime.      [name] Kitchen levothyroxine (SYNTHROID, LEVOTHROID) 175 MCG tablet Take 175 mcg by mouth daily before breakfast.      . losartan (COZAAR) 25 MG tablet Take 25 mg by mouth daily.      . metoprolol succinate (TOPROL-XL) 25 MG 24 hr tablet Take 25 mg by mouth daily.       [name] Kitchen omeprazole (PRILOSEC) 40 MG capsule Take 40 mg by mouth daily.      . vancomycin (VANCOCIN) 1 GM/200ML SOLN Inject 200 mLs (1,000 mg total) into the vein every 12 (twelve) hours.  4000 mL  0  . aspirin EC 81 MG tablet Take 81 mg by mouth daily.       No current facility-administered medications on file prior to visit.   Active Ambulatory Problems    Diagnosis Date Noted  . HIP PAIN 11/15/2007  . 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 Effusion of knee joint, left 10/11/2012  . OA (osteoarthritis) of knee 10/11/2012  . CAP (community acquired pneumonia) 01/26/2014  . CAD (coronary artery disease) of artery bypass graft 01/26/2014  . Unspecified hypothyroidism 01/26/2014  . Thrombocytopenia, unspecified 01/26/2014  . Nausea alone 01/26/2014  . Septic joint of left knee joint 06/22/2014   Resolved Ambulatory Problems    Diagnosis Date Noted  . No Resolved Ambulatory Problems   Past Medical History  Diagnosis Date  . High cholesterol   . GERD (gastroesophageal reflux disease)   . Anxiety   . Coronary artery disease   .  Shortness of breath   . Hypothyroidism   . H/O hiatal hernia   . Ischemic cardiomyopathy   . Aortic stenosis   . ITP (idiopathic thrombocytopenic purpura)   . Myocardial infarction 2011  . Pneumonia 1940's X 1; 2015 X 1  . History of blood transfusion reaction 2014  . Arthritis   . Depression    History  Substance Use Topics  . Smoking status: Former Smoker -- 1.00 packs/day for 15 years    Types: Cigarettes  . Smokeless tobacco: Former Systems developer     Comment: "quit smoking ~ late ~ 60's; quit chewing in the 1970's"  . Alcohol Use: No  family history includes Arthritis in an other family member; Heart disease in an other family member.   Review of Systems No fever, chills, diarrhea, n/v/, rash, no picc line dysfunction, no erythema to knee    Objective:   Physical Exam  BP 128/77  Pulse 67  Temp(Src) 97.8 F (36.6 C) (Oral)   Ht 6\' 1"  (1.854 m)  Wt 231 lb (104.781 kg)  BMI 30.48 kg/m2 Physical Exam  Constitutional: He is oriented to person, place, and time. He appears well-developed and well-nourished. No distress.  Ext: left knee has well healed incsions for port site. Slight effusion most noticeable in superior aspect of joint. No warmth. Full rOM Skin: Skin is warm and dry. No rash noted. No erythema. picc line in right arm is c/d/i       Assessment & Plan:  MRSE septic arthritis to left native knee joint = finish out 28 day course of vancomycin. End date of July 30th. Can pull picc line on July 31st. Continue with weekly dressing change, will ask advance to go out today to redress. vanco trough 15-20. Weekly lab draws of cbc, bmp, and vanco trough.

## 2014-07-07 NOTE — Telephone Encounter (Signed)
Gave verbal orders to Marissa at [facility] to continue iv antibiotics through 7/30, pull PICC on 7/31.  Spoke with Gwynne Edinger, RN in [place] to confirm that patient's PICC dressing will be changed today.  Patient's PICC was reinforced, dressing on his knee was changed by this RN. Landis Gandy, RN

## 2014-07-18 ENCOUNTER — Telehealth: Payer: Self-pay | Admitting: *Deleted

## 2014-07-18 NOTE — Telephone Encounter (Signed)
Call from [place], [place] with [place] on visit today patient's knee appeared red and swollen, no fever. He said he saw Dr. Macario Golds and was told maybe he was receiving too much vancomycin. Nurse assured him that his vanc trough was being monitored and his levels are good. She has been drawing weekly cbc, bmp and vanc level; it was drawn today and results will be faxed to Dr. Baxter Flattery. Nurse reported that patient has been up on his feet more than he should and has not been elevating his knee. Advised per Dr. Baxter Flattery picc line is still scheduled to be pulled on 07/21/14.

## 2014-08-09 ENCOUNTER — Other Ambulatory Visit: Payer: Self-pay | Admitting: Orthopaedic Surgery

## 2014-08-14 ENCOUNTER — Encounter (HOSPITAL_COMMUNITY): Payer: Self-pay | Admitting: Pharmacy Technician

## 2014-08-15 ENCOUNTER — Other Ambulatory Visit (HOSPITAL_COMMUNITY): Payer: Self-pay | Admitting: *Deleted

## 2014-08-15 ENCOUNTER — Encounter (HOSPITAL_COMMUNITY): Payer: Self-pay

## 2014-08-15 ENCOUNTER — Encounter (HOSPITAL_COMMUNITY)
Admission: RE | Admit: 2014-08-15 | Discharge: 2014-08-15 | Disposition: A | Discharge status: Home / Self Care | Payer: Medicare Other | Source: Ambulatory Visit | Attending: Orthopaedic Surgery | Admitting: Orthopaedic Surgery

## 2014-08-15 LAB — CBC
HEMATOCRIT: 42.7 % (ref 39.0–52.0)
HEMOGLOBIN: 14.4 g/dL (ref 13.0–17.0)
MCH: 30.8 pg (ref 26.0–34.0)
MCHC: 33.7 g/dL (ref 30.0–36.0)
MCV: 91.4 fL (ref 78.0–100.0)
Platelets: 368 10*3/uL (ref 150–400)
RBC: 4.67 MIL/uL (ref 4.22–5.81)
RDW: 13.7 % (ref 11.5–15.5)
WBC: 14.8 10*3/uL — ABNORMAL HIGH (ref 4.0–10.5)

## 2014-08-15 LAB — BASIC METABOLIC PANEL
ANION GAP: 13 (ref 5–15)
BUN: 30 mg/dL — AB (ref 6–23)
CHLORIDE: 99 meq/L (ref 96–112)
CO2: 24 mEq/L (ref 19–32)
Calcium: 9.7 mg/dL (ref 8.4–10.5)
Creatinine, Ser: 1.47 mg/dL — ABNORMAL HIGH (ref 0.50–1.35)
GFR calc Af Amer: 51 mL/min — ABNORMAL LOW (ref >90)
GFR calc non Af Amer: 44 mL/min — ABNORMAL LOW (ref >90)
Glucose, Bld: 152 mg/dL — ABNORMAL HIGH (ref 70–99)
Potassium: 4.8 mEq/L (ref 3.7–5.3)
Sodium: 136 mEq/L — ABNORMAL LOW (ref 137–147)

## 2014-08-15 LAB — SURGICAL PCR SCREEN
MRSA, PCR: NEGATIVE
Staphylococcus aureus: NEGATIVE

## 2014-08-15 NOTE — Pre-Procedure Instructions (Signed)
Jeffrey [name]  08/15/2014   Your procedure is scheduled on:  Thursday, August 17, 2014 at 12:00 PM.   Report to [facility] Entrance "A" Admitting Office at 10:00 AM.   Call this number if you have problems the morning of surgery: [phone]   Remember:   Do not eat food or drink liquids after midnight Wednesday, 08/16/14.   Take these medicines the morning of surgery with A SIP OF WATER: FLUoxetine (PROZAC),  gabapentin (NEURONTIN), levothyroxine (SYNTHROID, LEVOTHROID), metoprolol succinate (TOPROL-XL), omeprazole (PRILOSEC), fluticasone-salmeterol (ADVAIR HFA) inhaler, Hydrocodone-Acetaminophen - if needed, ALPRAZolam Duanne Moron) - if needed.  Stop Aspirin and Fish Oil as of today if you've not already done so.    Do not wear jewelry.  Do not wear lotions, powders, or cologne. You may wear deodorant.  Men may shave face and neck.  Do not bring valuables to the hospital.  [facility] is not responsible                  for any belongings or valuables.               Contacts, dentures or bridgework may not be worn into surgery.  Leave suitcase in the car. After surgery it may be brought to your room.  For patients admitted to the hospital, discharge time is determined by your                treatment team.            Special Instructions: [place] - Preparing for Surgery  Before surgery, you can play an important role.  Because skin is not sterile, your skin needs to be as free of germs as possible.  You can reduce the number of germs on you skin by washing with CHG (chlorahexidine gluconate) soap before surgery.  CHG is an antiseptic cleaner which kills germs and bonds with the skin to continue killing germs even after washing.  Please DO NOT use if you have an allergy to CHG or antibacterial soaps.  If your skin becomes reddened/irritated stop using the CHG and inform your nurse when you arrive at Short Stay.  Do not shave (including legs and underarms) for at least 48  hours prior to the first CHG shower.  You may shave your face.  Please follow these instructions carefully:   1.  Shower with CHG Soap the night before surgery and the                                morning of Surgery.  2.  If you choose to wash your hair, wash your hair first as usual with your       normal shampoo.  3.  After you shampoo, rinse your hair and body thoroughly to remove the                      Shampoo.  4.  Use CHG as you would any other liquid soap.  You can apply chg directly       to the skin and wash gently with scrungie or a clean washcloth.  5.  Apply the CHG Soap to your body ONLY FROM THE NECK DOWN.        Do not use on open wounds or open sores.  Avoid contact with your eyes, ears, mouth and genitals (private parts).  Wash genitals (private parts) with your  normal soap.  6.  Wash thoroughly, paying special attention to the area where your surgery        will be performed.  7.  Thoroughly rinse your body with warm water from the neck down.  8.  DO NOT shower/wash with your normal soap after using and rinsing off       the CHG Soap.  9.  Pat yourself dry with a clean towel.            10.  Wear clean pajamas.            11.  Place clean sheets on your bed the night of your first shower and do not        sleep with pets.  Day of Surgery  Do not apply any lotions the morning of surgery.  Please wear clean clothes to the hospital/surgery center.     Please read over the following fact sheets that you were given: Pain Booklet, Coughing and Deep Breathing and Surgical Site Infection Prevention

## 2014-08-15 NOTE — Progress Notes (Signed)
On arrival to PAT appt, pt in severe pain. Pt pale, nauseated, constantly rubbing left knee. Wife states he didn't take his pain medicine prior to leaving home this AM. She states that usually the only thing that relieves the pain when it gets this bad is having the fluid drawn off. I called Dr. Jerald Kief office and they stated that Dr. Rhona Raider is in surgery here at [facility]. I called the main OR and spoke with an OR nurse who relayed the message from me to Dr. Rhona Raider. I was told to have pt go straight to Dr. Jerald Kief office as soon as he was done here and someone would see him there and have fluid drawn off. Wife answered pre-op questions, pt still very uncomfortable. Vomited a small amount of clear emesis (had had some sips of water). Pt and wife both voiced understanding of going straight to Dr. Jerald Kief office as soon as they leave here.

## 2014-08-15 NOTE — Progress Notes (Signed)
08/15/14 0828  OBSTRUCTIVE SLEEP APNEA  Have you ever been diagnosed with sleep apnea through a sleep study? No  Do you snore loudly (loud enough to be heard through closed doors)?  1  Do you often feel tired, fatigued, or sleepy during the daytime? 1  Has anyone observed you stop breathing during your sleep? 0  Do you have, or are you being treated for high blood pressure? 0  BMI more than 35 kg/m2? 0  Age over 78 years old? 1  Neck circumference greater than 40 cm/16 inches? 1  Gender: 1  Obstructive Sleep Apnea Score 5  Score 4 or greater  Results sent to PCP

## 2014-08-15 NOTE — Progress Notes (Signed)
Stop Bang assessment sent to PCP.

## 2014-08-16 MED ORDER — CEFAZOLIN SODIUM-DEXTROSE 2-3 GM-% IV SOLR: 2.0000 g | INTRAVENOUS | Status: DC

## 2014-08-16 NOTE — H&P (Signed)
Jeffrey [name] is an 80 y.o. male.   Chief Complaint: Left Knee pain HPI: Jeffrey [name] continues to struggle with knee pain and swelling.  He is a month and a half from arthroscopic I&D and was on IV antibiotics for several weeks afterwards.  Unfortunately his swelling returned and he was aspirated multiple times that his painful effusion recurs after a few days.   Past Medical History  Diagnosis Date  . High cholesterol   . GERD (gastroesophageal reflux disease)   . Anxiety   . Coronary artery disease   . Shortness of breath   . Hypothyroidism   . H/O hiatal hernia   . Ischemic cardiomyopathy   . Aortic stenosis     mild AS by 07/20/13 echo ([facility])  . ITP (idiopathic thrombocytopenic purpura)     Dr. Gaynelle Arabian, on [place]  . Myocardial infarction 2010-03-16  . Pneumonia 1940's X 1; 16-Mar-2014 X 1  . History of blood transfusion reaction 16-Mar-2013    "he almost died; he has to get irradiated blood next time"  . Arthritis     "all over"  . Depression     Past Surgical History  Procedure Laterality Date  . Hernia repair    . Appendectomy    . Back surgery    . Knee arthroscopy Left 06/22/2014    w/I&D  . Hiatal hernia repair    . Coronary angioplasty with stent placement      "think I have 3-4 stents" (06/22/2014)  . Coronary artery bypass graft  03-16-10    "CABG X3"  . Sternal closure      "wires from OHS taken out; plate put in" (08/26/6212)  . Lumbar disc surgery  1960's?  . Knee arthroscopy Left 06/22/2014    Procedure: IRRIGATION AND DEBRIDEMENT WITH CHRONDROPLASTY;  Surgeon: Hessie Dibble, MD;  Location: [facility];  Service: Orthopedics;  Laterality: Left;    Family History  Problem Relation Age of Onset  . Heart disease    . Arthritis     Social History:  reports that he has quit smoking. His smoking use included Cigarettes. He has a 15 pack-year smoking history. He has quit using smokeless tobacco. He reports that he does not drink alcohol or use illicit  drugs.  Allergies:  Allergies  Allergen Reactions  . Lipitor [Atorvastatin] Other (See Comments)    cramps  . Methylprednisolone Other (See Comments)    cramps    No prescriptions prior to admission    Results for orders placed during the hospital encounter of 08/15/14 (from the past 48 hour(s))  SURGICAL PCR SCREEN     Status: None   Collection Time    08/15/14  8:43 AM      Result Value Ref Range   MRSA, PCR NEGATIVE  NEGATIVE   Staphylococcus aureus NEGATIVE  NEGATIVE   Comment:            The Xpert SA Assay (FDA     approved for NASAL specimens     in patients over 3 years of age),     is one component of     a comprehensive surveillance     program.  Test performance has     been validated by [employer] for patients greater     than or equal to 48 year old.     It is not intended     to diagnose infection nor to     guide  or monitor treatment.  BASIC METABOLIC PANEL     Status: Abnormal   Collection Time    08/15/14  8:43 AM      Result Value Ref Range   Sodium 136 (*) 137 - 147 mEq/L   Potassium 4.8  3.7 - 5.3 mEq/L   Chloride 99  96 - 112 mEq/L   CO2 24  19 - 32 mEq/L   Glucose, Bld 152 (*) 70 - 99 mg/dL   BUN 30 (*) 6 - 23 mg/dL   Creatinine, Ser 1.47 (*) 0.50 - 1.35 mg/dL   Calcium 9.7  8.4 - 10.5 mg/dL   GFR calc non Af Amer 44 (*) >90 mL/min   GFR calc Af Amer 51 (*) >90 mL/min   Comment: (NOTE)     The eGFR has been calculated using the CKD EPI equation.     This calculation has not been validated in all clinical situations.     eGFR's persistently <90 mL/min signify possible Chronic Kidney     Disease.   Anion gap 13  5 - 15  CBC     Status: Abnormal   Collection Time    08/15/14  8:43 AM      Result Value Ref Range   WBC 14.8 (*) 4.0 - 10.5 K/uL   RBC 4.67  4.22 - 5.81 MIL/uL   Hemoglobin 14.4  13.0 - 17.0 g/dL   HCT 42.7  39.0 - 52.0 %   MCV 91.4  78.0 - 100.0 fL   MCH 30.8  26.0 - 34.0 pg   MCHC 33.7  30.0 - 36.0 g/dL   RDW 13.7   11.5 - 15.5 %   Platelets 368  150 - 400 K/uL   No results found.  Review of Systems  Musculoskeletal: Positive for joint pain.       Left knee  All other systems reviewed and are negative.   There were no vitals taken for this visit. Physical Exam  Constitutional: He is oriented to person, place, and time. He appears well-developed and well-nourished.  HENT:  Head: Normocephalic and atraumatic.  Eyes: Conjunctivae are normal. Pupils are equal, round, and reactive to light.  Neck: Normal range of motion.  Cardiovascular: Normal rate.   Respiratory: Effort normal.  GI: Soft.  Musculoskeletal:  Examination of his left knee shows moderate effusion.  Range of motion from 5-80.  He is tender to palpation throughout his knee.  His knee is stable to varus and valgus stressing.  His calf is soft and nontender.  Neurovascularly intact distally.    Neurological: He is alert and oriented to person, place, and time.  Skin: Skin is warm and dry.  Psychiatric: He has a normal mood and affect. His behavior is normal. Judgment and thought content normal.     Assessment/Plan Left knee end-stage DJD, gout, and infection with arthroscopic I&D 06/22/14  Plan: Jeffrey [place] continues with some significant inflammation about his knee.  The material we aspirated recently showed white cells and no organisms but did show a cell count a 57,000.  We certainly cannot perform a knee replacement in that environment.  I reviewed his case with Dr. Mayer Camel and I would agree that our best option is an open synovectomy followed by a good month of antibiotic coverage. We will set him up and plan to keep him in the hospital for about 3 days with drains in place.  We will get an infectious disease consult.  I would think he be about  a year from a definitive knee replacement assuming we can calm down the infection/inflammation.  I reviewed risk of anesthesia and infection and DVT related to this intervention.  Dequavius Kuhner, Larwance Sachs 08/16/2014, 3:23 PM

## 2014-08-17 ENCOUNTER — Inpatient Hospital Stay (HOSPITAL_COMMUNITY): Payer: Medicare Other | Admitting: Certified Registered"

## 2014-08-17 ENCOUNTER — Inpatient Hospital Stay (HOSPITAL_COMMUNITY)
Admission: RE | Admit: 2014-08-17 | Discharge: 2014-08-20 | DRG: 486 | Disposition: A | Discharge status: Home Health | Payer: Medicare Other | Source: Ambulatory Visit | Attending: Orthopaedic Surgery | Admitting: Orthopaedic Surgery

## 2014-08-17 ENCOUNTER — Encounter (HOSPITAL_COMMUNITY)
Admission: RE | Disposition: A | Discharge status: Home Health | Payer: Self-pay | Source: Ambulatory Visit | Attending: Orthopaedic Surgery

## 2014-08-17 ENCOUNTER — Encounter (HOSPITAL_COMMUNITY): Payer: Medicare Other | Admitting: Certified Registered"

## 2014-08-17 ENCOUNTER — Encounter (HOSPITAL_COMMUNITY): Payer: Self-pay | Admitting: Certified Registered"

## 2014-08-17 DIAGNOSIS — F3289 Other specified depressive episodes: Secondary | ICD-10-CM | POA: Diagnosis present

## 2014-08-17 DIAGNOSIS — M25569 Pain in unspecified knee: Secondary | ICD-10-CM | POA: Diagnosis present

## 2014-08-17 DIAGNOSIS — M009 Pyogenic arthritis, unspecified: Secondary | ICD-10-CM | POA: Diagnosis present

## 2014-08-17 DIAGNOSIS — F329 Major depressive disorder, single episode, unspecified: Secondary | ICD-10-CM | POA: Diagnosis present

## 2014-08-17 DIAGNOSIS — M00869 Arthritis due to other bacteria, unspecified knee: Secondary | ICD-10-CM | POA: Diagnosis present

## 2014-08-17 DIAGNOSIS — K219 Gastro-esophageal reflux disease without esophagitis: Secondary | ICD-10-CM | POA: Diagnosis present

## 2014-08-17 DIAGNOSIS — Z9861 Coronary angioplasty status: Secondary | ICD-10-CM | POA: Diagnosis not present

## 2014-08-17 DIAGNOSIS — I10 Essential (primary) hypertension: Secondary | ICD-10-CM | POA: Diagnosis present

## 2014-08-17 DIAGNOSIS — F411 Generalized anxiety disorder: Secondary | ICD-10-CM | POA: Diagnosis present

## 2014-08-17 DIAGNOSIS — I2510 Atherosclerotic heart disease of native coronary artery without angina pectoris: Secondary | ICD-10-CM | POA: Diagnosis present

## 2014-08-17 DIAGNOSIS — Z6830 Body mass index (BMI) 30.0-30.9, adult: Secondary | ICD-10-CM

## 2014-08-17 DIAGNOSIS — Z87891 Personal history of nicotine dependence: Secondary | ICD-10-CM | POA: Diagnosis not present

## 2014-08-17 DIAGNOSIS — E039 Hypothyroidism, unspecified: Secondary | ICD-10-CM | POA: Diagnosis present

## 2014-08-17 DIAGNOSIS — Z951 Presence of aortocoronary bypass graft: Secondary | ICD-10-CM

## 2014-08-17 DIAGNOSIS — R110 Nausea: Secondary | ICD-10-CM | POA: Diagnosis not present

## 2014-08-17 DIAGNOSIS — E669 Obesity, unspecified: Secondary | ICD-10-CM | POA: Diagnosis present

## 2014-08-17 DIAGNOSIS — M129 Arthropathy, unspecified: Secondary | ICD-10-CM | POA: Diagnosis present

## 2014-08-17 DIAGNOSIS — I252 Old myocardial infarction: Secondary | ICD-10-CM

## 2014-08-17 DIAGNOSIS — E7800 Pure hypercholesterolemia, unspecified: Secondary | ICD-10-CM | POA: Diagnosis present

## 2014-08-17 DIAGNOSIS — I2589 Other forms of chronic ischemic heart disease: Secondary | ICD-10-CM | POA: Diagnosis present

## 2014-08-17 DIAGNOSIS — I359 Nonrheumatic aortic valve disorder, unspecified: Secondary | ICD-10-CM | POA: Diagnosis present

## 2014-08-17 DIAGNOSIS — D693 Immune thrombocytopenic purpura: Secondary | ICD-10-CM | POA: Diagnosis present

## 2014-08-17 HISTORY — PX: SYNOVECTOMY: SHX5180

## 2014-08-17 LAB — SYNOVIAL CELL COUNT + DIFF, W/ CRYSTALS
Eosinophils-Synovial: 0 % (ref 0–1)
Lymphocytes-Synovial Fld: 2 % (ref 0–20)
Monocyte-Macrophage-Synovial Fluid: 38 % — ABNORMAL LOW (ref 50–90)
Neutrophil, Synovial: 60 % — ABNORMAL HIGH (ref 0–25)
OTHER CELLS-SYN: 0
WBC, Synovial: 27250 /mm3 — ABNORMAL HIGH (ref 0–200)

## 2014-08-17 SURGERY — SYNOVECTOMY
Anesthesia: General | Site: Knee | Laterality: Left

## 2014-08-17 MED ORDER — ELTROMBOPAG OLAMINE 25 MG PO TABS: 25.0000 mg | ORAL_TABLET | ORAL | Status: DC

## 2014-08-17 MED ORDER — BUPIVACAINE LIPOSOME 1.3 % IJ SUSP
20.0000 mL | Freq: Once | INTRAMUSCULAR | Status: AC
  Administered 2014-08-17: 20 mL
  Filled 2014-08-17: qty 20

## 2014-08-17 MED ORDER — CHLORHEXIDINE GLUCONATE 4 % EX LIQD
60.0000 mL | Freq: Once | CUTANEOUS | Status: DC
Start: 2014-08-17 — End: 2014-08-17
  Filled 2014-08-17: qty 60

## 2014-08-17 MED ORDER — EPHEDRINE SULFATE 50 MG/ML IJ SOLN
INTRAMUSCULAR | Status: DC | PRN
  Administered 2014-08-17: 10 mg via INTRAVENOUS

## 2014-08-17 MED ORDER — ALPRAZOLAM 0.5 MG PO TABS
0.5000 mg | ORAL_TABLET | Freq: Three times a day (TID) | ORAL | Status: DC | PRN
  Administered 2014-08-19 – 2014-08-20 (×3): 0.5 mg via ORAL
  Filled 2014-08-17 (×3): qty 1

## 2014-08-17 MED ORDER — NEOSTIGMINE METHYLSULFATE 10 MG/10ML IV SOLN
INTRAVENOUS | Status: AC
  Filled 2014-08-17: qty 1

## 2014-08-17 MED ORDER — FLUOXETINE HCL 10 MG PO CAPS
10.0000 mg | ORAL_CAPSULE | Freq: Every day | ORAL | Status: DC
  Administered 2014-08-17 – 2014-08-19 (×3): 10 mg via ORAL
  Filled 2014-08-17 (×4): qty 1

## 2014-08-17 MED ORDER — FENTANYL CITRATE 0.05 MG/ML IJ SOLN
INTRAMUSCULAR | Status: DC | PRN
  Administered 2014-08-17: 100 ug via INTRAVENOUS
  Administered 2014-08-17: 50 ug via INTRAVENOUS

## 2014-08-17 MED ORDER — METOCLOPRAMIDE HCL 10 MG PO TABS: 5.0000 mg | ORAL_TABLET | Freq: Three times a day (TID) | ORAL | Status: DC | PRN

## 2014-08-17 MED ORDER — LOSARTAN POTASSIUM 25 MG PO TABS
25.0000 mg | ORAL_TABLET | Freq: Every day | ORAL | Status: DC
  Administered 2014-08-17 – 2014-08-20 (×4): 25 mg via ORAL
  Filled 2014-08-17 (×4): qty 1

## 2014-08-17 MED ORDER — MEPERIDINE HCL 25 MG/ML IJ SOLN: 6.2500 mg | INTRAMUSCULAR | Status: DC | PRN

## 2014-08-17 MED ORDER — GABAPENTIN 100 MG PO CAPS
100.0000 mg | ORAL_CAPSULE | Freq: Three times a day (TID) | ORAL | Status: DC
  Administered 2014-08-17 – 2014-08-20 (×9): 100 mg via ORAL
  Filled 2014-08-17 (×11): qty 1

## 2014-08-17 MED ORDER — OXYCODONE HCL 5 MG PO TABS
5.0000 mg | ORAL_TABLET | Freq: Once | ORAL | Status: AC | PRN
  Administered 2014-08-17: 5 mg via ORAL

## 2014-08-17 MED ORDER — LIDOCAINE HCL (CARDIAC) 20 MG/ML IV SOLN
INTRAVENOUS | Status: DC | PRN
  Administered 2014-08-17: 20 mg via INTRAVENOUS

## 2014-08-17 MED ORDER — METHOCARBAMOL 1000 MG/10ML IJ SOLN
500.0000 mg | Freq: Four times a day (QID) | INTRAMUSCULAR | Status: DC | PRN
  Filled 2014-08-17: qty 5

## 2014-08-17 MED ORDER — VANCOMYCIN HCL IN DEXTROSE 1-5 GM/200ML-% IV SOLN
1000.0000 mg | Freq: Two times a day (BID) | INTRAVENOUS | Status: DC
  Administered 2014-08-18 – 2014-08-19 (×3): 1000 mg via INTRAVENOUS
  Filled 2014-08-17 (×4): qty 200

## 2014-08-17 MED ORDER — ONDANSETRON HCL 4 MG/2ML IJ SOLN
INTRAMUSCULAR | Status: DC | PRN
  Administered 2014-08-17: 4 mg via INTRAVENOUS

## 2014-08-17 MED ORDER — SODIUM CHLORIDE 0.9 % IR SOLN
Status: DC | PRN
  Administered 2014-08-17: 3000 mL

## 2014-08-17 MED ORDER — METHOCARBAMOL 500 MG PO TABS
500.0000 mg | ORAL_TABLET | Freq: Four times a day (QID) | ORAL | Status: DC | PRN
  Administered 2014-08-17 – 2014-08-18 (×3): 500 mg via ORAL
  Filled 2014-08-17 (×3): qty 1

## 2014-08-17 MED ORDER — DIPHENHYDRAMINE HCL 12.5 MG/5ML PO ELIX: 12.5000 mg | ORAL_SOLUTION | ORAL | Status: DC | PRN

## 2014-08-17 MED ORDER — ONDANSETRON HCL 4 MG/2ML IJ SOLN
INTRAMUSCULAR | Status: AC
  Filled 2014-08-17: qty 2

## 2014-08-17 MED ORDER — FENOFIBRATE 160 MG PO TABS
160.0000 mg | ORAL_TABLET | Freq: Every day | ORAL | Status: DC
  Administered 2014-08-17 – 2014-08-20 (×4): 160 mg via ORAL
  Filled 2014-08-17 (×4): qty 1

## 2014-08-17 MED ORDER — ONDANSETRON HCL 4 MG PO TABS
4.0000 mg | ORAL_TABLET | Freq: Four times a day (QID) | ORAL | Status: DC | PRN
  Administered 2014-08-18: 4 mg via ORAL
  Filled 2014-08-17: qty 1

## 2014-08-17 MED ORDER — METOPROLOL SUCCINATE ER 25 MG PO TB24
25.0000 mg | ORAL_TABLET | Freq: Every day | ORAL | Status: DC
  Administered 2014-08-17 – 2014-08-20 (×4): 25 mg via ORAL
  Filled 2014-08-17 (×4): qty 1

## 2014-08-17 MED ORDER — HYDROCODONE-ACETAMINOPHEN 7.5-325 MG PO TABS
1.0000 | ORAL_TABLET | ORAL | Status: DC | PRN
  Administered 2014-08-17: 1 via ORAL
  Administered 2014-08-18 – 2014-08-19 (×3): 2 via ORAL
  Filled 2014-08-17 (×3): qty 2
  Filled 2014-08-17: qty 1
  Filled 2014-08-17: qty 2

## 2014-08-17 MED ORDER — SODIUM CHLORIDE 0.9 % IR SOLN
Status: DC | PRN
  Administered 2014-08-17: 12:00:00

## 2014-08-17 MED ORDER — 0.9 % SODIUM CHLORIDE (POUR BTL) OPTIME
TOPICAL | Status: DC | PRN
  Administered 2014-08-17: 1000 mL

## 2014-08-17 MED ORDER — SUCCINYLCHOLINE CHLORIDE 20 MG/ML IJ SOLN
INTRAMUSCULAR | Status: DC | PRN
  Administered 2014-08-17: 100 mg via INTRAVENOUS

## 2014-08-17 MED ORDER — PANTOPRAZOLE SODIUM 40 MG PO TBEC
80.0000 mg | DELAYED_RELEASE_TABLET | Freq: Every day | ORAL | Status: DC
  Administered 2014-08-17 – 2014-08-20 (×4): 80 mg via ORAL
  Filled 2014-08-17 (×5): qty 2

## 2014-08-17 MED ORDER — PROPOFOL 10 MG/ML IV BOLUS
INTRAVENOUS | Status: DC | PRN
  Administered 2014-08-17: 200 mg via INTRAVENOUS

## 2014-08-17 MED ORDER — ASPIRIN EC 81 MG PO TBEC
81.0000 mg | DELAYED_RELEASE_TABLET | Freq: Every day | ORAL | Status: DC
  Administered 2014-08-17 – 2014-08-20 (×4): 81 mg via ORAL
  Filled 2014-08-17 (×4): qty 1

## 2014-08-17 MED ORDER — ONDANSETRON HCL 4 MG/2ML IJ SOLN: 4.0000 mg | Freq: Four times a day (QID) | INTRAMUSCULAR | Status: DC | PRN

## 2014-08-17 MED ORDER — ASPIRIN 81 MG PO CHEW
CHEWABLE_TABLET | ORAL | Status: AC
  Administered 2014-08-17: 81 mg
  Filled 2014-08-17: qty 1

## 2014-08-17 MED ORDER — FENTANYL CITRATE 0.05 MG/ML IJ SOLN
INTRAMUSCULAR | Status: AC
Start: 2014-08-17 — End: 2014-08-17
  Filled 2014-08-17: qty 5

## 2014-08-17 MED ORDER — PROMETHAZINE HCL 25 MG/ML IJ SOLN: 6.2500 mg | INTRAMUSCULAR | Status: DC | PRN

## 2014-08-17 MED ORDER — LEVOTHYROXINE SODIUM 175 MCG PO TABS
175.0000 ug | ORAL_TABLET | Freq: Every day | ORAL | Status: DC
  Administered 2014-08-17 – 2014-08-19 (×3): 175 ug via ORAL
  Filled 2014-08-17 (×4): qty 1

## 2014-08-17 MED ORDER — METOCLOPRAMIDE HCL 5 MG/ML IJ SOLN: 5.0000 mg | Freq: Three times a day (TID) | INTRAMUSCULAR | Status: DC | PRN

## 2014-08-17 MED ORDER — LACTATED RINGERS IV SOLN
INTRAVENOUS | Status: DC
  Administered 2014-08-18 – 2014-08-19 (×3): via INTRAVENOUS

## 2014-08-17 MED ORDER — DOCUSATE SODIUM 100 MG PO CAPS
100.0000 mg | ORAL_CAPSULE | Freq: Two times a day (BID) | ORAL | Status: DC
  Administered 2014-08-17 – 2014-08-20 (×7): 100 mg via ORAL
  Filled 2014-08-17 (×8): qty 1

## 2014-08-17 MED ORDER — BISACODYL 5 MG PO TBEC: 5.0000 mg | DELAYED_RELEASE_TABLET | Freq: Every day | ORAL | Status: DC | PRN

## 2014-08-17 MED ORDER — LIDOCAINE HCL (CARDIAC) 20 MG/ML IV SOLN
INTRAVENOUS | Status: AC
  Filled 2014-08-17: qty 5

## 2014-08-17 MED ORDER — MOMETASONE FURO-FORMOTEROL FUM 200-5 MCG/ACT IN AERO
2.0000 | INHALATION_SPRAY | Freq: Two times a day (BID) | RESPIRATORY_TRACT | Status: DC
  Administered 2014-08-17 – 2014-08-20 (×6): 2 via RESPIRATORY_TRACT
  Filled 2014-08-17: qty 8.8

## 2014-08-17 MED ORDER — OXYCODONE HCL 5 MG PO TABS
ORAL_TABLET | ORAL | Status: AC
  Filled 2014-08-17: qty 1

## 2014-08-17 MED ORDER — PROPOFOL 10 MG/ML IV BOLUS
INTRAVENOUS | Status: AC
  Filled 2014-08-17: qty 20

## 2014-08-17 MED ORDER — OXYCODONE HCL 5 MG/5ML PO SOLN: 5.0000 mg | Freq: Once | ORAL | Status: AC | PRN

## 2014-08-17 MED ORDER — VANCOMYCIN HCL 10 G IV SOLR
2000.0000 mg | Freq: Once | INTRAVENOUS | Status: AC
  Administered 2014-08-17: 2000 mg via INTRAVENOUS
  Filled 2014-08-17: qty 2000

## 2014-08-17 MED ORDER — HYDROMORPHONE HCL PF 1 MG/ML IJ SOLN
0.2500 mg | INTRAMUSCULAR | Status: DC | PRN
  Administered 2014-08-17 (×2): 0.5 mg via INTRAVENOUS

## 2014-08-17 MED ORDER — CEFAZOLIN SODIUM-DEXTROSE 2-3 GM-% IV SOLR
INTRAVENOUS | Status: AC
  Administered 2014-08-17: 2 g via INTRAVENOUS
  Filled 2014-08-17: qty 50

## 2014-08-17 MED ORDER — HYDROMORPHONE HCL PF 1 MG/ML IJ SOLN
INTRAMUSCULAR | Status: AC
  Administered 2014-08-17: 0.5 mg via INTRAVENOUS
  Filled 2014-08-17: qty 1

## 2014-08-17 MED ORDER — ELTROMBOPAG OLAMINE 25 MG PO TABS
25.0000 mg | ORAL_TABLET | ORAL | Status: DC
  Administered 2014-08-18: 25 mg via ORAL

## 2014-08-17 MED ORDER — HYDROMORPHONE HCL PF 1 MG/ML IJ SOLN
0.5000 mg | INTRAMUSCULAR | Status: DC | PRN
  Administered 2014-08-17 – 2014-08-19 (×6): 1 mg via INTRAVENOUS
  Filled 2014-08-17 (×6): qty 1

## 2014-08-17 MED ORDER — GLYCOPYRROLATE 0.2 MG/ML IJ SOLN
INTRAMUSCULAR | Status: AC
  Filled 2014-08-17: qty 2

## 2014-08-17 MED ORDER — LACTATED RINGERS IV SOLN
INTRAVENOUS | Status: DC
  Administered 2014-08-17: 12:00:00 via INTRAVENOUS

## 2014-08-17 SURGICAL SUPPLY — 55 items
BANDAGE ELASTIC 4 VELCRO ST LF (GAUZE/BANDAGES/DRESSINGS) ×3 IMPLANT
BANDAGE ELASTIC 6 VELCRO ST LF (GAUZE/BANDAGES/DRESSINGS) ×3 IMPLANT
BLADE SURG 10 STRL SS (BLADE) ×6 IMPLANT
BNDG COHESIVE 4X5 TAN STRL (GAUZE/BANDAGES/DRESSINGS) ×3 IMPLANT
BNDG GAUZE ELAST 4 BULKY (GAUZE/BANDAGES/DRESSINGS) ×3 IMPLANT
COVER SURGICAL LIGHT HANDLE (MISCELLANEOUS) ×3 IMPLANT
CUFF TOURNIQUET SINGLE 34IN LL (TOURNIQUET CUFF) ×3 IMPLANT
DRSG ADAPTIC 3X8 NADH LF (GAUZE/BANDAGES/DRESSINGS) ×3 IMPLANT
DRSG PAD ABDOMINAL 8X10 ST (GAUZE/BANDAGES/DRESSINGS) ×3 IMPLANT
DURAPREP 26ML APPLICATOR (WOUND CARE) ×3 IMPLANT
ELECT REM PT RETURN 9FT ADLT (ELECTROSURGICAL) ×3
ELECTRODE REM PT RTRN 9FT ADLT (ELECTROSURGICAL) ×1 IMPLANT
FACESHIELD WRAPAROUND (MASK) ×6 IMPLANT
GAUZE SPONGE 4X4 12PLY STRL (GAUZE/BANDAGES/DRESSINGS) ×3 IMPLANT
GAUZE XEROFORM 1X8 LF (GAUZE/BANDAGES/DRESSINGS) ×3 IMPLANT
GLOVE BIO SURGEON STRL SZ8 (GLOVE) ×12 IMPLANT
GLOVE BIOGEL PI IND STRL 6.5 (GLOVE) ×1 IMPLANT
GLOVE BIOGEL PI IND STRL 7.0 (GLOVE) ×1 IMPLANT
GLOVE BIOGEL PI IND STRL 8 (GLOVE) ×3 IMPLANT
GLOVE BIOGEL PI INDICATOR 6.5 (GLOVE) ×2
GLOVE BIOGEL PI INDICATOR 7.0 (GLOVE) ×2
GLOVE BIOGEL PI INDICATOR 8 (GLOVE) ×6
GLOVE SURG SS PI 7.0 STRL IVOR (GLOVE) ×6 IMPLANT
GOWN STRL REUS W/ TWL LRG LVL3 (GOWN DISPOSABLE) IMPLANT
GOWN STRL REUS W/ TWL XL LVL3 (GOWN DISPOSABLE) ×1 IMPLANT
GOWN STRL REUS W/TWL 2XL LVL3 (GOWN DISPOSABLE) ×3 IMPLANT
GOWN STRL REUS W/TWL LRG LVL3 (GOWN DISPOSABLE)
GOWN STRL REUS W/TWL XL LVL3 (GOWN DISPOSABLE) ×2
HANDPIECE INTERPULSE COAX TIP (DISPOSABLE) ×2
IMMOBILIZER KNEE 22 UNIV (SOFTGOODS) ×3 IMPLANT
KIT BASIN OR (CUSTOM PROCEDURE TRAY) ×3 IMPLANT
KIT ROOM TURNOVER OR (KITS) ×3 IMPLANT
MANIFOLD NEPTUNE II (INSTRUMENTS) ×3 IMPLANT
NS IRRIG 1000ML POUR BTL (IV SOLUTION) ×3 IMPLANT
PACK ORTHO EXTREMITY (CUSTOM PROCEDURE TRAY) ×3 IMPLANT
PAD ARMBOARD 7.5X6 YLW CONV (MISCELLANEOUS) ×6 IMPLANT
PENCIL BUTTON HOLSTER BLD 10FT (ELECTRODE) ×3 IMPLANT
SET HNDPC FAN SPRY TIP SCT (DISPOSABLE) ×1 IMPLANT
SPONGE GAUZE 4X4 12PLY STER LF (GAUZE/BANDAGES/DRESSINGS) ×6 IMPLANT
SPONGE LAP 18X18 X RAY DECT (DISPOSABLE) ×6 IMPLANT
STOCKINETTE IMPERVIOUS 9X36 MD (GAUZE/BANDAGES/DRESSINGS) ×3 IMPLANT
SUT ETHILON 3 0 PS 1 (SUTURE) IMPLANT
SUT PDS AB 0 CT 36 (SUTURE) ×3 IMPLANT
SUT VIC AB 0 CT1 27 (SUTURE) ×2
SUT VIC AB 0 CT1 27XBRD ANBCTR (SUTURE) ×1 IMPLANT
SUT VIC AB 2-0 CT1 27 (SUTURE) ×2
SUT VIC AB 2-0 CT1 TAPERPNT 27 (SUTURE) ×1 IMPLANT
SUT VLOC 180 0 24IN GS25 (SUTURE) ×3 IMPLANT
TOWEL OR 17X24 6PK STRL BLUE (TOWEL DISPOSABLE) ×3 IMPLANT
TOWEL OR 17X26 10 PK STRL BLUE (TOWEL DISPOSABLE) ×3 IMPLANT
TUBE CONNECTING 12'X1/4 (SUCTIONS) ×1
TUBE CONNECTING 12X1/4 (SUCTIONS) ×2 IMPLANT
UNDERPAD 30X30 INCONTINENT (UNDERPADS AND DIAPERS) ×3 IMPLANT
WATER STERILE IRR 1000ML POUR (IV SOLUTION) ×3 IMPLANT
YANKAUER SUCT BULB TIP NO VENT (SUCTIONS) ×3 IMPLANT

## 2014-08-17 NOTE — Consult Note (Signed)
Unfortunately, swelling returned.  Unclear if infection but certainly a concern.  Culture pending, previously with MRSE.  Will need a prolonged course again and will wait and see if anything grows now.  May have been an issue with synovium or could be GNR so will consider levaquin as well empirically if nothing grows out.

## 2014-08-17 NOTE — Consult Note (Signed)
[facility] for Infectious Disease    Date of Admission:  08/17/2014   Total days of antibiotics 1        Day 1 Vancomycin               Reason for Consult: Left knee effusion/ possible septic arthritis    Referring Physician: Dr. Melrose Nakayama Primary Care Physician: Dr. [name]  Active Problems:   Bacterial infection of knee joint   . aspirin EC  81 mg Oral Daily  . docusate sodium  100 mg Oral BID  . [START ON 08/18/2014] eltrombopag  25 mg Oral Once per day on Mon Fri  . fenofibrate  160 mg Oral Daily  . FLUoxetine  10 mg Oral QHS  . gabapentin  100 mg Oral TID  . levothyroxine  175 mcg Oral QHS  . losartan  25 mg Oral Daily  . metoprolol succinate  25 mg Oral Daily  . mometasone-formoterol  2 puff Inhalation BID  . oxyCODONE      . pantoprazole  80 mg Oral Daily  . vancomycin  2,000 mg Intravenous Once  . [START ON 08/18/2014] vancomycin  1,000 mg Intravenous Q12H    Recommendations: 1. Start IV Vancomycin + Levaquin po    Assessment: 80 y/o M w/ recurrent left knee effusion. Patient was previously admitted on 06/22/14 and treated for MRSE septic arthritis w/ 4 week course of Vancomycin, ending on 07/20/14. The patient claims the joint continued to swell and become painful almost exactly 2 weeks after antibiotics were stopped. Per chart review, recent aspiration showed 57,000 WBC's, w/ no organisms. Patient went for irrigation and synovectomy today and is doing well. Given recurrence of symptoms and significant inflammation, should still be treated as in infection. Synovial fluid cultures from today pending. Would recommend starting Vancomycin + Levaquin po for additional gram negative coverage for a total of 4 weeks.  HPI: Jeffrey [name] is a 80 y.o. male w/ PMHx of HLD, HTN, CAD, GERD, h/o ITP, Hypothyroidism, anxiety and depression, admitted for recurrent painful left knee effusion. The patient states the pain and swelling started about 2 weeks ago, no  associated fever, chills, nausea, or vomiting. Mr. Lazarz was recently admitted on 06/22/14 for septic arthritis 2/2 MRSE at that time and treated w/ Vancomycin IV for 4 weeks. During that time, the pain and swelling had subsided, only to return 2 weeks after discontinuing ABx on 07/20/14. The patient denies any other issues. No other joint pain or swelling, no chest pain, SOB, dizziness, lightheadedness, or palpitations.   Review of Systems: General: Denies fever, chills, diaphoresis, appetite change and fatigue.  Respiratory: Denies SOB, DOE, cough, chest tightness, and wheezing.   Cardiovascular: Denies chest pain and palpitations.  Gastrointestinal: Denies nausea, vomiting, abdominal pain, and diarrhea. Genitourinary: Denies dysuria, urgency, frequency. Musculoskeletal: Positive for left knee pain and swelling. Denies myalgias, back pain..  Skin: Denies pallor, rash and wounds.  Neurological: Denies dizziness, seizures, syncope, weakness, lightheadedness, numbness and headaches.  Psychiatric/Behavioral: Denies mood changes, confusion, nervousness, sleep disturbance and agitation.   Past Medical History  Diagnosis Date  . High cholesterol   . GERD (gastroesophageal reflux disease)   . Anxiety   . Coronary artery disease   . Shortness of breath   . Hypothyroidism   . H/O hiatal hernia   . Ischemic cardiomyopathy   . Aortic stenosis     mild AS by 07/20/13 echo ([facility])  .  ITP (idiopathic thrombocytopenic purpura)     Dr. Gaynelle Arabian, on [place]  . Myocardial infarction March 18, 2010  . Pneumonia 1940's X 1; 03-18-2014 X 1  . History of blood transfusion reaction 03-18-13    "he almost died; he has to get irradiated blood next time"  . Arthritis     "all over"  . Depression     History  Substance Use Topics  . Smoking status: Former Smoker -- 1.00 packs/day for 15 years    Types: Cigarettes  . Smokeless tobacco: Former Systems developer     Comment: "quit smoking ~ late ~ 60's; quit  chewing in the 1970's"  . Alcohol Use: No    Family History  Problem Relation Age of Onset  . Heart disease    . Arthritis     Allergies  Allergen Reactions  . Lipitor [Atorvastatin] Other (See Comments)    cramps  . Methylprednisolone Other (See Comments)    cramps    OBJECTIVE: Blood pressure 124/66, pulse 61, temperature 97.5 F (36.4 C), temperature source Oral, resp. rate 14, height 6\' 1"  (1.854 m), weight 228 lb (103.42 kg), SpO2 95.00%.  Physical Exam: General: Pleasant elderly male, alert, cooperative, NAD. HEENT: PERRL, EOMI. Moist mucus membranes Neck: Full range of motion without pain, supple, no lymphadenopathy or carotid bruits Lungs: Clear to ascultation bilaterally, normal work of respiration, no wheezes, rales, rhonchi Heart: RRR, no murmurs, gallops, or rubs Abdomen: Soft, non-tender, non-distended, BS + Extremities: No cyanosis, clubbing, or edema. Right leg in large boot w/ dressing. Wound vac draining serosanguinous fluid.  Neurologic: Alert & oriented X3, cranial nerves II-XII intact, strength grossly intact, sensation intact to light touch   Lab Results Lab Results  Component Value Date   WBC 14.8* 08/15/2014   HGB 14.4 08/15/2014   HCT 42.7 08/15/2014   MCV 91.4 08/15/2014   PLT 368 08/15/2014    Lab Results  Component Value Date   CREATININE 1.47* 08/15/2014   BUN 30* 08/15/2014   NA 136* 08/15/2014   K 4.8 08/15/2014   CL 99 08/15/2014   CO2 24 08/15/2014    Lab Results  Component Value Date   ALT 31 01/26/2014   AST 19 01/26/2014   ALKPHOS 44 01/26/2014   BILITOT 0.7 01/26/2014     Microbiology: Recent Results (from the past 240 hour(s))  SURGICAL PCR SCREEN     Status: None   Collection Time    08/15/14  8:43 AM      Result Value Ref Range Status   MRSA, PCR NEGATIVE  NEGATIVE Final   Staphylococcus aureus NEGATIVE  NEGATIVE Final   Comment:            The Xpert SA Assay (FDA     approved for NASAL specimens     in patients over 21 years of  age),     is one component of     a comprehensive surveillance     program.  Test performance has     been validated by [employer] for patients greater     than or equal to 82 year old.     It is not intended     to diagnose infection nor to     guide or monitor treatment.    Luanne Bras, MD PGY-2, Internal Medicine Pager: [phone] 08/17/2014, 3:36 PM

## 2014-08-17 NOTE — Anesthesia Preprocedure Evaluation (Addendum)
Anesthesia Evaluation  Patient identified by MRN, date of birth, ID band Patient awake    Reviewed: Allergy & Precautions, H&P , NPO status , Patient's Chart, lab work & pertinent test results, reviewed documented beta blocker date and time   History of Anesthesia Complications Negative for: history of anesthetic complications  Airway Mallampati: II TM Distance: >3 FB Neck ROM: Full    Dental  (+) Caps, Dental Advisory Given   Pulmonary former smoker,  breath sounds clear to auscultation        Cardiovascular hypertension, Pt. on medications and Pt. on home beta blockers + CAD, + Cardiac Stents and + CABG (occlusion of some SVG) Rhythm:Regular Rate:Normal  '14 Cath: CAD, with some SVG occlusion, some collateral flow, medical management recommended   Neuro/Psych Anxiety Depression negative neurological ROS     GI/Hepatic Neg liver ROS, GERD-  Medicated and Poorly Controlled,  Endo/Other  Hypothyroidism   Renal/GU Renal InsufficiencyRenal disease (creat 1.47)     Musculoskeletal   Abdominal (+) + obese,   Peds  Hematology negative hematology ROS (+)   Anesthesia Other Findings   Reproductive/Obstetrics                        Anesthesia Physical Anesthesia Plan  ASA: III  Anesthesia Plan: General   Post-op Pain Management:    Induction: Intravenous  Airway Management Planned: Oral ETT  Additional Equipment:   Intra-op Plan:   Post-operative Plan: Extubation in OR  Informed Consent: I have reviewed the patients History and Physical, chart, labs and discussed the procedure including the risks, benefits and alternatives for the proposed anesthesia with the patient or authorized representative who has indicated his/her understanding and acceptance.   Dental advisory given  Plan Discussed with: CRNA and Surgeon  Anesthesia Plan Comments: (Plan routine monitors, GETA)         Anesthesia Quick Evaluation

## 2014-08-17 NOTE — Evaluation (Signed)
Physical Therapy Evaluation Patient Details Name: Jeffrey [name] MRN: [id number] DOB: 10-25-1934 Today's Date: 08/17/2014   History of Present Illness  80 y.o. male w/ PMHx of HLD, HTN, CAD, GERD, h/o ITP, Hypothyroidism, anxiety and depression, admitted for recurrent painful left knee effusion.  pt is s/p I&D and synovectomy.   Clinical Impression  Patient is s/p above surgery resulting in functional limitations due to the deficits listed below (see PT Problem List).  Patient will benefit from skilled PT to increase their independence and safety with mobility to allow discharge to the venue listed below. Anticipate good rehab progress, pt very motivated to return home wife wife and be independent.    Follow Up Recommendations Home health PT;Supervision/Assistance - 24 hour    Equipment Recommendations  None recommended by PT    Recommendations for Other Services OT consult     Precautions / Restrictions Precautions Precautions: Knee Required Braces or Orthoses: Knee Immobilizer - Left Knee Immobilizer - Left: On when out of bed or walking Restrictions Weight Bearing Restrictions: Yes LLE Weight Bearing: Weight bearing as tolerated      Mobility  Bed Mobility Overal bed mobility: Needs Assistance Bed Mobility: Supine to Sit     Supine to sit: Min guard;HOB elevated     General bed mobility comments: min guard to guide Lt LE off EOB; cues for hand placement and sequencing; heavy use of handrails   Transfers Overall transfer level: Needs assistance Equipment used: Rolling walker (2 wheeled) Transfers: Sit to/from Stand Sit to Stand: Min guard         General transfer comment: min guard to steady; cues for hand placement and safety with RW   Ambulation/Gait Ambulation/Gait assistance: Min guard Ambulation Distance (Feet): 30 Feet Assistive device: Rolling walker (2 wheeled) Gait Pattern/deviations: Decreased stance time - left;Decreased step length -  right;Antalgic;Wide base of support Gait velocity: decr due to pain  Gait velocity interpretation: Below normal speed for age/gender General Gait Details: cues for gt sequencing and safety with RW; min guard to steady and balance   Stairs            Wheelchair Mobility    Modified Rankin (Stroke Patients Only)       Balance Overall balance assessment: Needs assistance Sitting-balance support: Feet supported;No upper extremity supported Sitting balance-Leahy Scale: Good     Standing balance support: During functional activity;Bilateral upper extremity supported Standing balance-Leahy Scale: Poor Standing balance comment: relies on RW for UE support                             Pertinent Vitals/Pain Pain Assessment: 0-10 Pain Score: 4  Pain Location: Lt knee  Pain Descriptors / Indicators: Operative site guarding;Sharp Pain Intervention(s): Premedicated before session;Repositioned;Monitored during session;Ice applied    Home Living Family/patient expects to be discharged to:: Private residence Living Arrangements: Spouse/significant other Available Help at Discharge: Family;Available 24 hours/day Type of Home: House Home Access: Stairs to enter Entrance Stairs-Rails: Left Entrance Stairs-Number of Steps: 4 Home Layout: One level Home Equipment: Walker - 2 wheels;Shower seat;Bedside commode      Prior Function Level of Independence: Independent with assistive device(s)         Comments: pt ambulating with RW since previous knee surgery     Hand Dominance        Extremity/Trunk Assessment   Upper Extremity Assessment: Defer to OT evaluation  Lower Extremity Assessment: LLE deficits/detail      Cervical / Trunk Assessment: Normal  Communication   Communication: No difficulties  Cognition Arousal/Alertness: Awake/alert Behavior During Therapy: WFL for tasks assessed/performed Overall Cognitive Status: Within Functional  Limits for tasks assessed                      General Comments      Exercises General Exercises - Lower Extremity Ankle Circles/Pumps: AROM;Strengthening;Both;10 reps;Seated      Assessment/Plan    PT Assessment Patient needs continued PT services  PT Diagnosis Abnormality of gait;Generalized weakness;Acute pain   PT Problem List Decreased strength;Decreased range of motion;Decreased activity tolerance;Decreased balance;Decreased mobility;Decreased knowledge of use of DME;Pain  PT Treatment Interventions DME instruction;Gait training;Stair training;Functional mobility training;Therapeutic exercise;Therapeutic activities;Balance training;Neuromuscular re-education;Patient/family education   PT Goals (Current goals can be found in the Care Plan section) Acute Rehab PT Goals Patient Stated Goal: to get better then go home PT Goal Formulation: With patient Time For Goal Achievement: 08/21/14 Potential to Achieve Goals: Good    Frequency Min 5X/week   Barriers to discharge        Co-evaluation               End of Session Equipment Utilized During Treatment: Gait belt;Left knee immobilizer Activity Tolerance: Patient tolerated treatment well Patient left: in chair;with call bell/phone within reach;with family/visitor present Nurse Communication: Mobility status;Precautions;Weight bearing status         Time: 5929-2446 PT Time Calculation (min): 19 min   Charges:   PT Evaluation $Initial PT Evaluation Tier I: 1 Procedure PT Treatments $Gait Training: 8-22 mins   PT G CodesGustavus [name], [place] 08/17/2014, 4:47 PM

## 2014-08-17 NOTE — Brief Op Note (Signed)
Jeffrey [name] [id number] 08/17/2014   PRE-OP DIAGNOSIS: left knee infection  POST-OP DIAGNOSIS: same  PROCEDURE: left knee I&D and synovectomy  ANESTHESIA: general  Annjeanette Sarwar G   Dictation #:  [id number]

## 2014-08-17 NOTE — Interval H&P Note (Signed)
History and Physical Interval Note:  08/17/2014 11:34 AM  Jeffrey [name]  has presented today for surgery, with the diagnosis of infected left knee  The various methods of treatment have been discussed with the patient and family. After consideration of risks, benefits and other options for treatment, the patient has consented to  Procedure(s): SYNOVECTOMY LEFT KNEE (Left) as a surgical intervention .  The patient's history has been reviewed, patient examined, no change in status, stable for surgery.  I have reviewed the patient's chart and labs.  Questions were answered to the patient's satisfaction.     Burna Atlas G

## 2014-08-17 NOTE — Progress Notes (Signed)
Utilization review completed.  

## 2014-08-17 NOTE — Transfer of Care (Signed)
Immediate Anesthesia Transfer of Care Note  Patient: Jeffrey [name]  Procedure(s) Performed: Procedure(s): SYNOVECTOMY LEFT KNEE (Left)  Patient Location: PACU  Anesthesia Type:General  Level of Consciousness: awake, alert , oriented and patient cooperative  Airway & Oxygen Therapy: Patient Spontanous Breathing and Patient connected to nasal cannula oxygen  Post-op Assessment: Report given to PACU RN, Post -op Vital signs reviewed and stable and Patient moving all extremities  Post vital signs: Reviewed and stable  Complications: No apparent anesthesia complications

## 2014-08-17 NOTE — Anesthesia Procedure Notes (Signed)
Procedure Name: Intubation Date/Time: 08/17/2014 12:08 PM Performed by: Julian Reil Pre-anesthesia Checklist: Patient identified, Emergency Drugs available, Suction available and Patient being monitored Oxygen Delivery Method: Circle system utilized Preoxygenation: Pre-oxygenation with 100% oxygen Intubation Type: IV induction Ventilation: Mask ventilation without difficulty Laryngoscope Size: Mac and 4 Tube type: Oral Tube size: 7.5 mm Number of attempts: 1 Airway Equipment and Method: Stylet Placement Confirmation: ETT inserted through vocal cords under direct vision,  positive ETCO2 and breath sounds checked- equal and bilateral Secured at: 22 cm Tube secured with: Tape Dental Injury: Teeth and Oropharynx as per pre-operative assessment

## 2014-08-17 NOTE — Op Note (Signed)
Jeffrey [name], Jeffrey [name]                ACCOUNT NO.:  [id number]  MEDICAL RECORD NO.:  [id number]  LOCATION:  5N05C                        FACILITY:  [facility]  PHYSICIAN:  Monico Blitz. Jozef Eisenbeis, M.D.DATE OF BIRTH:  [birth year]  DATE OF PROCEDURE:  08/17/2014 DATE OF DISCHARGE:                              OPERATIVE REPORT   PREOPERATIVE DIAGNOSIS:  Left knee infection.  POSTOPERATIVE DIAGNOSIS:  Left knee infection.  PROCEDURE:  Left knee irrigation and debridement and synovectomy.  ANESTHESIA:  General.  ATTENDING SURGEON:  Monico Blitz. Rhona Raider, M.D.  ASSISTANT:  Loni Dolly, P.A.  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  He may also have gout and/or infection.  He has had multiple aspirations and cultures.  He has had some crystals on 1 or 2 aspirates and had MRSA grown on one aspirate long ago.  The last 5 or 6 aspirates have been negative for any sort of infection but also negative for crystals.  He has a need of a total knee replacement.  We took him to the operating room for arthroscopic I and D about a month ago and placed him on IV antibiotics for several weeks. He did well but about 2 weeks after coming off the antibiotics, he developed recurrent effusion which continues.  At this point, he is offered an open synovectomy in hopes of eliminating whatever sort of infection is present to allow a knee replacement in the future. Informed operative consent was obtained after discussion of possible complications including reaction to anesthesia and obviously continued infection.  SUMMARY OF FINDINGS AND PROCEDURE:  Under general anesthesia, we performed a thorough irrigation and debridement of the knee in an open fashion.  We also did a open synovectomy.  He was closed over drains and admitted for IV antibiotics.  DESCRIPTION OF PROCEDURE:  The patient was taken to the operating suite where general anesthetic was  applied without difficulty.  He was positioned supine, prepped and draped in normal sterile fashion.  Prior to the administration of IV antibiotic, we aspirated the knee and sent a specimen off to the lab.  He was then given a preoperative IV Kefzol. The leg was elevated, exsanguinated, and tourniquet inflated about the thigh.  We made a midline incision with dissection down the extensor mechanism.  I made a medial parapatellar incision here.  The kneecap was flipped and the knee flexed.  He had just a small amount of fluid in the knee and some inflammatory synovium.  He had advanced degenerative changes as well.  I performed a thorough synovectomy and sent a portion of this to the lab for examination.  We then irrigated with 3 L of saline and 1 L of bug juice.  We then placed a Hemovac drain with 2 arms exiting superiorly.  The capsule was then reapproximated with V-Loc suture followed by subcutaneous reapproximation loosely with Vicryl and skin closure loosely with PDS suture in interrupted fashion.  The tourniquet was deflated prior to closure, and a small amount of bleeding was easily controlled with some pressure.  We placed Adaptic over the wound, dry gauze and a loose Ace  wrap.  Estimated blood loss and fluids can be obtained from Anesthesia records as can accurate tourniquet time.  DISPOSITION:  The patient was extubated in the operating room and taken to recovery room in stable addition.  He was to be admitted to the Orthopedic Surgery Service for appropriate postoperative care.     Monico Blitz Rhona Raider, M.D.     PGD/MEDQ  D:  08/17/2014  T:  08/17/2014  Job:  [id number]

## 2014-08-17 NOTE — Progress Notes (Signed)
ANTIBIOTIC CONSULT NOTE - INITIAL  Pharmacy Consult for vancomycin Indication: post op left knee infection  Allergies  Allergen Reactions  . Lipitor [Atorvastatin] Other (See Comments)    cramps  . Methylprednisolone Other (See Comments)    cramps    Patient Measurements: Height: 6\' 1"  (185.4 cm) Weight: 228 lb (103.42 kg) IBW/kg (Calculated) : 79.9   Vital Signs: Temp: 97.5 F (36.4 C) (08/27 1446) Temp src: Oral (08/27 0958) BP: 136/73 mmHg (08/27 1446) Pulse Rate: 62 (08/27 1446) Intake/Output from previous day:   Intake/Output from this shift: Total I/O In: 950 [P.O.:150; I.V.:800] Out: 50 [Blood:50]  Labs:  Recent Labs  08/15/14 0843  WBC 14.8*  HGB 14.4  PLT 368  CREATININE 1.47*   Estimated Creatinine Clearance: 53.2 ml/min (by C-G formula based on Cr of 1.47). No results found for this basename: VANCOTROUGH, Corlis Leak, VANCORANDOM, GENTTROUGH, GENTPEAK, GENTRANDOM, TOBRATROUGH, TOBRAPEAK, TOBRARND, AMIKACINPEAK, AMIKACINTROU, AMIKACIN,  in the last 72 hours   Microbiology: Recent Results (from the past 720 hour(s))  SURGICAL PCR SCREEN     Status: None   Collection Time    08/15/14  8:43 AM      Result Value Ref Range Status   MRSA, PCR NEGATIVE  NEGATIVE Final   Staphylococcus aureus NEGATIVE  NEGATIVE Final   Comment:            The Xpert SA Assay (FDA     approved for NASAL specimens     in patients over 19 years of age),     is one component of     a comprehensive surveillance     program.  Test performance has     been validated by [employer] for patients greater     than or equal to 70 year old.     It is not intended     to diagnose infection nor to     guide or monitor treatment.    Medical History: Past Medical History  Diagnosis Date  . High cholesterol   . GERD (gastroesophageal reflux disease)   . Anxiety   . Coronary artery disease   . Shortness of breath   . Hypothyroidism   . H/O hiatal hernia   . Ischemic  cardiomyopathy   . Aortic stenosis     mild AS by 07/20/13 echo ([facility])  . ITP (idiopathic thrombocytopenic purpura)     Dr. Gaynelle Arabian, on [place]  . Myocardial infarction 03/17/10  . Pneumonia 1940's X 1; 03-17-14 X 1  . History of blood transfusion reaction 03-17-13    "he almost died; he has to get irradiated blood next time"  . Arthritis     "all over"  . Depression     Medications:  Prescriptions prior to admission  Medication Sig Dispense Refill  . ALPRAZolam (XANAX) 0.5 MG tablet Take 0.5 mg by mouth 3 (three) times daily as needed for anxiety.       [name] Kitchen aspirin EC 81 MG tablet Take 81 mg by mouth daily.      [name] Kitchen doxycycline (VIBRA-TABS) 100 MG tablet Take 100 mg by mouth 2 (two) times daily.      [name] Kitchen eltrombopag (PROMACTA) 25 MG tablet Take 25 mg by mouth 2 (two) times a week. Take on an empty stomach, 1 hour before a meal or 2 hours after.      . fenofibrate (TRICOR) 145 MG tablet Take 145 mg by mouth daily.      [name] Kitchen  FLUoxetine (PROZAC) 10 MG capsule Take 10 mg by mouth at bedtime.       . fluticasone-salmeterol (ADVAIR HFA) 230-21 MCG/ACT inhaler Inhale 1 puff into the lungs 2 (two) times daily.       [name] Kitchen gabapentin (NEURONTIN) 100 MG capsule Take 100 mg by mouth 3 (three) times daily.      . Hydrocodone-Acetaminophen 10-300 MG TABS Take 0.5-1 tablets by mouth 2 (two) times daily as needed (for pain).       [name] Kitchen levothyroxine (SYNTHROID, LEVOTHROID) 175 MCG tablet Take 175 mcg by mouth at bedtime.       [name] Kitchen losartan (COZAAR) 25 MG tablet Take 25 mg by mouth daily.      . metoprolol succinate (TOPROL-XL) 25 MG 24 hr tablet Take 25 mg by mouth daily.      . Omega-3 Fatty Acids (FISH OIL PO) Take 1 capsule by mouth daily.      [name] Kitchen omeprazole (PRILOSEC) 40 MG capsule Take 40 mg by mouth daily.      . traMADol (ULTRAM) 50 MG tablet Take 50-100 mg by mouth 3 (three) times daily as needed (pain).        Assessment: 80 yo man to start vancomycin for infected left knee.  He was on  antibiotics for several weeks after an arthroscopic I&D.  He had left knee I&D and synovectomy and plan is for about a month of antibiotics.  Goal of Therapy:  Vancomycin trough level 15-20 mcg/ml  Plan:  Vancomycin 2000 mg IV X 1 then 1 gm IV q12 hours Will f/u cultures, clinical course and renal function Check vanc trough when appropriate.  Thanks for allowing pharmacy to be a part of this patient's care.  Excell Seltzer, PharmD Clinical Pharmacist, [phone] 08/17/2014,2:54 PM

## 2014-08-18 ENCOUNTER — Encounter (HOSPITAL_COMMUNITY): Payer: Self-pay | Admitting: Orthopaedic Surgery

## 2014-08-18 DIAGNOSIS — M25469 Effusion, unspecified knee: Secondary | ICD-10-CM

## 2014-08-18 LAB — BASIC METABOLIC PANEL
Anion gap: 8 (ref 5–15)
BUN: 29 mg/dL — AB (ref 6–23)
CALCIUM: 9.2 mg/dL (ref 8.4–10.5)
CO2: 29 mEq/L (ref 19–32)
Chloride: 101 mEq/L (ref 96–112)
Creatinine, Ser: 1.28 mg/dL (ref 0.50–1.35)
GFR calc Af Amer: 61 mL/min — ABNORMAL LOW (ref >90)
GFR calc non Af Amer: 52 mL/min — ABNORMAL LOW (ref >90)
GLUCOSE: 109 mg/dL — AB (ref 70–99)
POTASSIUM: 5.7 meq/L — AB (ref 3.7–5.3)
Sodium: 138 mEq/L (ref 137–147)

## 2014-08-18 LAB — CBC
HCT: 35.8 % — ABNORMAL LOW (ref 39.0–52.0)
Hemoglobin: 11.6 g/dL — ABNORMAL LOW (ref 13.0–17.0)
MCH: 30.2 pg (ref 26.0–34.0)
MCHC: 32.4 g/dL (ref 30.0–36.0)
MCV: 93.2 fL (ref 78.0–100.0)
Platelets: 312 10*3/uL (ref 150–400)
RBC: 3.84 MIL/uL — AB (ref 4.22–5.81)
RDW: 13.6 % (ref 11.5–15.5)
WBC: 11 10*3/uL — ABNORMAL HIGH (ref 4.0–10.5)

## 2014-08-18 MED ORDER — SODIUM CHLORIDE 0.9 % IJ SOLN
10.0000 mL | INTRAMUSCULAR | Status: DC | PRN
  Administered 2014-08-19 – 2014-08-20 (×2): 10 mL

## 2014-08-18 NOTE — Progress Notes (Signed)
Physical Therapy Treatment Patient Details Name: Jeffrey [name] MRN: [id number] DOB: 1934-10-18 Today's Date: 08/18/2014    History of Present Illness 80 y.o. male w/ PMHx of HLD, HTN, CAD, GERD, h/o ITP, Hypothyroidism, anxiety and depression, admitted for recurrent painful left knee effusion.  pt is s/p I&D and synovectomy.     PT Comments    Patient in increased pain this session. Stated that he had gotten up to the recliner with nursing and did not have his brace on. Wife education on positioning of brace and that patient is to have it on at all times. Patient agreeable to ambulation and is progressing well.   Follow Up Recommendations  Home health PT;Supervision/Assistance - 24 hour     Equipment Recommendations  None recommended by PT    Recommendations for Other Services       Precautions / Restrictions Precautions Precautions: Knee Required Braces or Orthoses: Knee Immobilizer - Left Knee Immobilizer - Left: On when out of bed or walking Restrictions LLE Weight Bearing: Weight bearing as tolerated    Mobility  Bed Mobility Overal bed mobility: Needs Assistance Bed Mobility: Supine to Sit     Supine to sit: Min assist     General bed mobility comments: Min A for LLE out/in  bed.   Transfers Overall transfer level: Needs assistance Equipment used: Rolling walker (2 wheeled)   Sit to Stand: Min guard         General transfer comment: min guard to steady; cues for hand placement and safety with RW   Ambulation/Gait Ambulation/Gait assistance: Min guard Ambulation Distance (Feet): 90 Feet Assistive device: Rolling walker (2 wheeled) Gait Pattern/deviations: Step-to pattern;Decreased step length - left;Decreased step length - right Gait velocity: decreased   General Gait Details: Min Guard  to steady and balance. Safe use of RW   Stairs            Wheelchair Mobility    Modified Rankin (Stroke Patients Only)       Balance                                     Cognition Arousal/Alertness: Awake/alert Behavior During Therapy: WFL for tasks assessed/performed Overall Cognitive Status: Within Functional Limits for tasks assessed                      Exercises      General Comments        Pertinent Vitals/Pain Pain Score: 7  Pain Location: Lt knee Pain Descriptors / Indicators: Aching Pain Intervention(s): Limited activity within patient's tolerance;Monitored during session    Home Living                      Prior Function            PT Goals (current goals can now be found in the care plan section) Progress towards PT goals: Progressing toward goals    Frequency  Min 5X/week    PT Plan Current plan remains appropriate    Co-evaluation             End of Session Equipment Utilized During Treatment: Gait belt;Left knee immobilizer Activity Tolerance: Patient tolerated treatment well Patient left: in bed;with call bell/phone within reach     Time: 0902-0929 PT Time Calculation (min): 27 min  Charges:  $Gait Training: 23-37 mins  G Codes:      Jacqualyn Posey 08/18/2014, 10:40 AM 08/18/2014 Jacqualyn Posey PTA [phone] pager [phone] office

## 2014-08-18 NOTE — Progress Notes (Signed)
[facility] for Infectious Disease  Date of Admission:  08/17/2014  Antibiotics: vancomycin  Subjective: nausea  Objective: Temp:  [97.4 F (36.3 C)-97.6 F (36.4 C)] 97.5 F (36.4 C) (08/28 0830) Pulse Rate:  [51-62] 51 (08/28 1048) Resp:  [12-21] 14 (08/28 0830) BP: (95-145)/(51-75) 112/58 mmHg (08/28 1048) SpO2:  [91 %-99 %] 95 % (08/28 1000)  General: awake, nad Skin: no rashes Lungs: CTA B Cor: RRR Abdomen: soft, nt Ext: wrapped  Lab Results Lab Results  Component Value Date   WBC 11.0* 08/18/2014   HGB 11.6* 08/18/2014   HCT 35.8* 08/18/2014   MCV 93.2 08/18/2014   PLT 312 08/18/2014    Lab Results  Component Value Date   CREATININE 1.28 08/18/2014   BUN 29* 08/18/2014   NA 138 08/18/2014   K 5.7* 08/18/2014   CL 101 08/18/2014   CO2 29 08/18/2014    Lab Results  Component Value Date   ALT 31 01/26/2014   AST 19 01/26/2014   ALKPHOS 44 01/26/2014   BILITOT 0.7 01/26/2014      Microbiology: Recent Results (from the past 240 hour(s))  SURGICAL PCR SCREEN     Status: None   Collection Time    08/15/14  8:43 AM      Result Value Ref Range Status   MRSA, PCR NEGATIVE  NEGATIVE Final   Staphylococcus aureus NEGATIVE  NEGATIVE Final   Comment:            The Xpert SA Assay (FDA     approved for NASAL specimens     in patients over 29 years of age),     is one component of     a comprehensive surveillance     program.  Test performance has     been validated by [employer] for patients greater     than or equal to 61 year old.     It is not intended     to diagnose infection nor to     guide or monitor treatment.  ANAEROBIC CULTURE     Status: None   Collection Time    08/17/14 12:50 PM      Result Value Ref Range Status   Specimen Description WOUND LEFT KNEE   Final   Special Requests     Final   Value: PATIENT ON FOLLOWING DOXYCYCLINE SWAB FROM LEFT KNEE SYNOVIUM   Gram Stain     Final   Value: RARE WBC PRESENT,BOTH PMN AND MONONUCLEAR     NO  SQUAMOUS EPITHELIAL CELLS SEEN     NO ORGANISMS SEEN     Performed at [facility]   Culture PENDING   Incomplete   Report Status PENDING   Incomplete  WOUND CULTURE     Status: None   Collection Time    08/17/14 12:50 PM      Result Value Ref Range Status   Specimen Description WOUND LEFT KNEE   Final   Special Requests     Final   Value: PATIENT ON FOLLOWING DOXYCYCLINE SWAB FROM LEFT KNEE SYNOVIUM   Gram Stain     Final   Value: RARE WBC PRESENT,BOTH PMN AND MONONUCLEAR     NO SQUAMOUS EPITHELIAL CELLS SEEN     NO ORGANISMS SEEN     Performed at [facility]   Culture     Final   Value: NO GROWTH 1 DAY     Performed at  [facility]   Report Status PENDING   Incomplete    Studies/Results: No results found.  Assessment/Plan: 1) Septic arthritis - history of MRSE and had been on one month of vancomycin before stopping.  Flared 2 weeks later, though did seem to respond to the vancomycin.  -Will continue to watch the culture, but if it remains negative, I would continue with vancomycin through Sept 23rd and consider a month of doxycycline or Bactrim for a month to see if that clears it.    Dr. Tommy Medal will monitor the culture over the weekend if he remains here.  Otherwise as above with vancomycin.    Scharlene Gloss, [place] [facility] [place] [email or web address] [phone] pager   [phone] cell 08/18/2014, 12:03 PM

## 2014-08-18 NOTE — Progress Notes (Signed)
Subjective: 1 Day Post-Op Procedure(s) (LRB): SYNOVECTOMY LEFT KNEE (Left)  Activity level:  OOB Diet tolerance:  regular Voiding:  well Patient reports pain as moderate.    Objective: Vital signs in last 24 hours: Temp:  [97.5 F (36.4 C)-97.6 F (36.4 C)] 97.5 F (36.4 C) (08/28 0830) Pulse Rate:  [51-62] 51 (08/28 1048) Resp:  [12-21] 14 (08/28 0830) BP: (95-145)/(51-75) 112/58 mmHg (08/28 1048) SpO2:  [91 %-99 %] 95 % (08/28 1000)  Labs:  Recent Labs  08/18/14 0525  HGB 11.6*    Recent Labs  08/18/14 0525  WBC 11.0*  RBC 3.84*  HCT 35.8*  PLT 312    Recent Labs  08/18/14 0525  NA 138  K 5.7*  CL 101  CO2 29  BUN 29*  CREATININE 1.28  GLUCOSE 109*  CALCIUM 9.2   No results found for this basename: LABPT, INR,  in the last 72 hours  Physical Exam:  Neurologically intact ABD soft Sensation intact distally Intact pulses distally No cellulitis present Compartment soft  Assessment/Plan:  1 Day Post-Op Procedure(s) (LRB): SYNOVECTOMY LEFT KNEE (Left) Antibiotics as per ID consult; pic line going in now Drains likely removed tomorrow and potentially home sunday    Tashunda Vandezande G 08/18/2014, 1:44 PM

## 2014-08-18 NOTE — Progress Notes (Signed)
Peripherally Inserted Central Catheter/Midline Placement  The IV Nurse has discussed with the patient and/or persons authorized to consent for the patient, the purpose of this procedure and the potential benefits and risks involved with this procedure.  The benefits include less needle sticks, lab draws from the catheter and patient may be discharged home with the catheter.  Risks include, but not limited to, infection, bleeding, blood clot (thrombus formation), and puncture of an artery; nerve damage and irregular heat beat.  Alternatives to this procedure were also discussed.  PICC/Midline Placement Documentation  PICC / Midline Single Lumen 21/22/48 PICC Right Basilic 42 cm 0 cm (Active)       Murvin Natal 08/18/2014, 1:47 PM

## 2014-08-19 LAB — BASIC METABOLIC PANEL
ANION GAP: 10 (ref 5–15)
BUN: 20 mg/dL (ref 6–23)
CO2: 29 meq/L (ref 19–32)
CREATININE: 1.17 mg/dL (ref 0.50–1.35)
Calcium: 9.1 mg/dL (ref 8.4–10.5)
Chloride: 98 mEq/L (ref 96–112)
GFR calc Af Amer: 68 mL/min — ABNORMAL LOW (ref >90)
GFR calc non Af Amer: 58 mL/min — ABNORMAL LOW (ref >90)
Glucose, Bld: 134 mg/dL — ABNORMAL HIGH (ref 70–99)
Potassium: 4.4 mEq/L (ref 3.7–5.3)
Sodium: 137 mEq/L (ref 137–147)

## 2014-08-19 LAB — VANCOMYCIN, TROUGH: Vancomycin Tr: 13.1 ug/mL (ref 10.0–20.0)

## 2014-08-19 LAB — CBC
HEMATOCRIT: 33.5 % — AB (ref 39.0–52.0)
Hemoglobin: 11.3 g/dL — ABNORMAL LOW (ref 13.0–17.0)
MCH: 30.4 pg (ref 26.0–34.0)
MCHC: 33.7 g/dL (ref 30.0–36.0)
MCV: 90.1 fL (ref 78.0–100.0)
Platelets: 275 10*3/uL (ref 150–400)
RBC: 3.72 MIL/uL — ABNORMAL LOW (ref 4.22–5.81)
RDW: 13.3 % (ref 11.5–15.5)
WBC: 10 10*3/uL (ref 4.0–10.5)

## 2014-08-19 LAB — WOUND CULTURE: Culture: NO GROWTH

## 2014-08-19 MED ORDER — SODIUM CHLORIDE 0.9 % IV SOLN
1250.0000 mg | Freq: Two times a day (BID) | INTRAVENOUS | Status: DC
  Administered 2014-08-19 – 2014-08-20 (×2): 1250 mg via INTRAVENOUS
  Filled 2014-08-19 (×3): qty 1250

## 2014-08-19 MED ORDER — OXYCODONE-ACETAMINOPHEN 5-325 MG PO TABS
1.0000 | ORAL_TABLET | ORAL | Status: DC | PRN
  Administered 2014-08-19 – 2014-08-20 (×3): 1 via ORAL
  Filled 2014-08-19 (×3): qty 1

## 2014-08-19 NOTE — Progress Notes (Signed)
Physical Therapy Treatment Patient Details Name: Jeffrey [name] MRN: [id number] DOB: 1934/03/17 Today's Date: 08/19/2014    History of Present Illness 80 y.o. male w/ PMHx of HLD, HTN, CAD, GERD, h/o ITP, Hypothyroidism, anxiety and depression, admitted for recurrent painful left knee effusion.  pt is s/p I&D and synovectomy.     PT Comments    Pt pleasant & willing to participate but reports having anxiety attacks today.  Cont with current POC.  Per chart review, possible d/c home tomorrow (sunday)- pt needs to practice steps next session.      Follow Up Recommendations  Home health PT;Supervision/Assistance - 24 hour     Equipment Recommendations  None recommended by PT    Recommendations for Other Services       Precautions / Restrictions Precautions Precautions: Knee Restrictions LLE Weight Bearing: Weight bearing as tolerated    Mobility  Bed Mobility                  Transfers Overall transfer level: Needs assistance Equipment used: Rolling walker (2 wheeled) Transfers: Sit to/from Stand Sit to Stand: Min guard         General transfer comment: cues for hand placement & body positioning before sitting  Ambulation/Gait Ambulation/Gait assistance: Min guard Ambulation Distance (Feet): 120 Feet Assistive device: Rolling walker (2 wheeled) Gait Pattern/deviations: Step-through pattern;Decreased stride length;Decreased step length - left;Decreased step length - right;Decreased weight shift to left Gait velocity: decreased   General Gait Details: cues to stay closer to RW, increased step/stride length, increased WBing LLE, & decreased reliance on RW with UE's.    Stairs            Wheelchair Mobility    Modified Rankin (Stroke Patients Only)       Balance                                    Cognition Arousal/Alertness: Awake/alert Behavior During Therapy: WFL for tasks assessed/performed Overall Cognitive Status: Within  Functional Limits for tasks assessed                      Exercises      General Comments        Pertinent Vitals/Pain Pain Assessment: 0-10 Pain Score: 4  Pain Location: Lt knee Pain Descriptors / Indicators: Aching Pain Intervention(s): Monitored during session;Repositioned    Home Living                      Prior Function            PT Goals (current goals can now be found in the care plan section) Acute Rehab PT Goals PT Goal Formulation: With patient Time For Goal Achievement: 08/21/14 Potential to Achieve Goals: Good Progress towards PT goals: Progressing toward goals    Frequency  Min 5X/week    PT Plan Current plan remains appropriate    Co-evaluation             End of Session Equipment Utilized During Treatment: Gait belt Activity Tolerance: Patient tolerated treatment well Patient left: in chair;with call bell/phone within reach;with family/visitor present     Time: 1256-1320 PT Time Calculation (min): 24 min  Charges:  $Gait Training: 23-37 mins                    G Codes:  Sena Hitch 08/19/2014, 1:31 PM   Sarajane Marek, [place] [phone] 08/19/2014

## 2014-08-19 NOTE — Progress Notes (Signed)
Subjective: 2 Days Post-Op Procedure(s) (LRB): SYNOVECTOMY LEFT KNEE (Left)  Activity level:  OOB Diet tolerance:  regular Voiding:  well Patient reports pain as moderate.  Having anxiety this am, received Xanax at 0700  Objective: Vital signs in last 24 hours: Temp:  [97.4 F (36.3 C)-98.3 F (36.8 C)] 98.3 F (36.8 C) (08/29 0548) Pulse Rate:  [51-62] 62 (08/29 0548) Resp:  [16-18] 18 (08/29 0548) BP: (112-120)/(54-78) 120/56 mmHg (08/29 0548) SpO2:  [91 %-98 %] 92 % (08/29 0548)  Labs:  Recent Labs  08/18/14 0525 08/19/14 0515  HGB 11.6* 11.3*    Recent Labs  08/18/14 0525 08/19/14 0515  WBC 11.0* 10.0  RBC 3.84* 3.72*  HCT 35.8* 33.5*  PLT 312 275    Recent Labs  08/18/14 0525 08/19/14 0515  NA 138 137  K 5.7* 4.4  CL 101 98  CO2 29 29  BUN 29* 20  CREATININE 1.28 1.17  GLUCOSE 109* 134*  CALCIUM 9.2 9.1   No results found for this basename: LABPT, INR,  in the last 72 hours  Physical Exam:  Neurologically intact ABD soft Sensation intact distally Intact pulses distally No cellulitis present Compartment soft  Assessment/Plan:  2 Days Post-Op Procedure(s) (LRB): SYNOVECTOMY LEFT KNEE (Left) Antibiotics as per ID consult; PICC line placed Drains d/c'd tomorrow, prob d/c on Sunday    Jeffrey [name] A. 08/19/2014, 8:49 AM

## 2014-08-19 NOTE — Progress Notes (Signed)
ANTIBIOTIC CONSULT NOTE - FOLLOW UP  Pharmacy Consult:  Vancomycin Indication:  Septic arthritis  Allergies  Allergen Reactions  . Lipitor [Atorvastatin] Other (See Comments)    cramps  . Methylprednisolone Other (See Comments)    cramps    Patient Measurements: Height: 6\' 1"  (185.4 cm) Weight: 228 lb (103.42 kg) IBW/kg (Calculated) : 79.9  Vital Signs: Temp: 98.1 F (36.7 C) (08/29 2032) Temp src: Oral (08/29 2032) BP: 119/57 mmHg (08/29 2032) Pulse Rate: 69 (08/29 2032) Intake/Output from previous day: 08/28 0701 - 08/29 0700 In: 780 [P.O.:780] Out: 3545 [Urine:3500; Emesis/NG output:45] Intake/Output from this shift: Total I/O In: 240 [P.O.:240] Out: 500 [Urine:500]  Labs:  Recent Labs  08/18/14 0525 08/19/14 0515  WBC 11.0* 10.0  HGB 11.6* 11.3*  PLT 312 275  CREATININE 1.28 1.17   Estimated Creatinine Clearance: 66.8 ml/min (by C-G formula based on Cr of 1.17).  Recent Labs  08/19/14 2100  [place] 13.1     Microbiology: Recent Results (from the past 720 hour(s))  SURGICAL PCR SCREEN     Status: None   Collection Time    08/15/14  8:43 AM      Result Value Ref Range Status   MRSA, PCR NEGATIVE  NEGATIVE Final   Staphylococcus aureus NEGATIVE  NEGATIVE Final   Comment:            The Xpert SA Assay (FDA     approved for NASAL specimens     in patients over 6 years of age),     is one component of     a comprehensive surveillance     program.  Test performance has     been validated by [employer] for patients greater     than or equal to 91 year old.     It is not intended     to diagnose infection nor to     guide or monitor treatment.  ANAEROBIC CULTURE     Status: None   Collection Time    08/17/14 12:50 PM      Result Value Ref Range Status   Specimen Description WOUND LEFT KNEE   Final   Special Requests     Final   Value: PATIENT ON FOLLOWING DOXYCYCLINE SWAB FROM LEFT KNEE SYNOVIUM   Gram Stain     Final   Value: RARE  WBC PRESENT,BOTH PMN AND MONONUCLEAR     NO SQUAMOUS EPITHELIAL CELLS SEEN     NO ORGANISMS SEEN     Performed at [facility]   Culture     Final   Value: NO ANAEROBES ISOLATED; CULTURE IN PROGRESS FOR 5 DAYS     Performed at [facility]   Report Status PENDING   Incomplete  WOUND CULTURE     Status: None   Collection Time    08/17/14 12:50 PM      Result Value Ref Range Status   Specimen Description WOUND LEFT KNEE   Final   Special Requests     Final   Value: PATIENT ON FOLLOWING DOXYCYCLINE SWAB FROM LEFT KNEE SYNOVIUM   Gram Stain     Final   Value: RARE WBC PRESENT,BOTH PMN AND MONONUCLEAR     NO SQUAMOUS EPITHELIAL CELLS SEEN     NO ORGANISMS SEEN     Performed at [facility]   Culture     Final   Value: NO GROWTH 2 DAYS  Performed at [facility]   Report Status 08/19/2014 FINAL   Final      Assessment: 80 YOM with septic arthritis, s/p left knee I&D and synovectomy, to continue on vancomycin.  Vancomycin trough slightly sub-therapeutic on vancomycin 1gm IV Q12H.  Patient's renal function is improving.   Goal of Therapy:  Vancomycin trough level 15-20 mcg/ml   Plan:  - Increase vanc to 1250mg  IV Q12H - Monitor renal fxn, clinical progress, vanc trough at new Css    Jr Milliron D. Mina Marble, PharmD, BCPS Pager:  [phone] 08/19/2014, 10:23 PM

## 2014-08-20 LAB — CBC
HEMATOCRIT: 34 % — AB (ref 39.0–52.0)
Hemoglobin: 11.3 g/dL — ABNORMAL LOW (ref 13.0–17.0)
MCH: 30.4 pg (ref 26.0–34.0)
MCHC: 33.2 g/dL (ref 30.0–36.0)
MCV: 91.4 fL (ref 78.0–100.0)
PLATELETS: 268 10*3/uL (ref 150–400)
RBC: 3.72 MIL/uL — ABNORMAL LOW (ref 4.22–5.81)
RDW: 13.3 % (ref 11.5–15.5)
WBC: 11 10*3/uL — ABNORMAL HIGH (ref 4.0–10.5)

## 2014-08-20 LAB — BASIC METABOLIC PANEL
Anion gap: 12 (ref 5–15)
BUN: 17 mg/dL (ref 6–23)
CO2: 28 mEq/L (ref 19–32)
CREATININE: 1.15 mg/dL (ref 0.50–1.35)
Calcium: 8.8 mg/dL (ref 8.4–10.5)
Chloride: 97 mEq/L (ref 96–112)
GFR calc Af Amer: 68 mL/min — ABNORMAL LOW (ref >90)
GFR, EST NON AFRICAN AMERICAN: 59 mL/min — AB (ref >90)
Glucose, Bld: 115 mg/dL — ABNORMAL HIGH (ref 70–99)
Potassium: 4.3 mEq/L (ref 3.7–5.3)
Sodium: 137 mEq/L (ref 137–147)

## 2014-08-20 MED ORDER — HEPARIN SOD (PORK) LOCK FLUSH 100 UNIT/ML IV SOLN
250.0000 [IU] | INTRAVENOUS | Status: AC | PRN
  Administered 2014-08-20: 250 [IU]

## 2014-08-20 MED ORDER — OXYCODONE-ACETAMINOPHEN 5-325 MG PO TABS: 1.0000 | ORAL_TABLET | ORAL | Status: DC | PRN

## 2014-08-20 NOTE — Progress Notes (Signed)
Subjective: 3 Days Post-Op Procedure(s) (LRB): SYNOVECTOMY LEFT KNEE (Left)  Activity level:  OOB Diet tolerance:  regular Voiding:  well Patient reports pain as reasonably controlled.  Desires to go home.  Cultures negative.  Objective: Vital signs in last 24 hours: Temp:  [97.8 F (36.6 C)-98.1 F (36.7 C)] 98.1 F (36.7 C) (08/30 0611) Pulse Rate:  [62-69] 64 (08/30 0611) Resp:  [18] 18 (08/30 0611) BP: (107-119)/(55-59) 114/55 mmHg (08/30 0611) SpO2:  [95 %-98 %] 98 % (08/30 0903)  Labs:  Recent Labs  08/18/14 0525 08/19/14 0515 08/20/14 0425  HGB 11.6* 11.3* 11.3*    Recent Labs  08/19/14 0515 08/20/14 0425  WBC 10.0 11.0*  RBC 3.72* 3.72*  HCT 33.5* 34.0*  PLT 275 268    Recent Labs  08/19/14 0515 08/20/14 0425  NA 137 137  K 4.4 4.3  CL 98 97  CO2 29 28  BUN 20 17  CREATININE 1.17 1.15  GLUCOSE 134* 115*  CALCIUM 9.1 8.8   No results found for this basename: LABPT, INR,  in the last 72 hours  Physical Exam:  Neurologically intact ABD soft Sensation intact distally Intact pulses distally No cellulitis present Compartment soft  Assessment/Plan:  3 Days Post-Op Procedure(s) (LRB): SYNOVECTOMY LEFT KNEE (Left) Antibiotics as per ID consult; PICC line placed D/C today, to re-start home IV antibiotics (Vanc per ID)    Jeffrey Guerrette A. 08/20/2014, 10:36 AM

## 2014-08-20 NOTE — Care Management Note (Addendum)
  Page 2 of 2   08/20/2014     3:00:07 PM CARE MANAGEMENT NOTE 08/20/2014  Patient:  Jeffrey [name], Jeffrey [name]   Account Number:  [id number]  Date Initiated:  08/20/2014  Documentation initiated by:  [facility]  Subjective/Objective Assessment:   ADM: (LRB):  SYNOVECTOMY LEFT KNEE (Left)     Action/Plan:   DISHCARGE PLANNING   Anticipated DC Date:  08/20/2014   Anticipated DC Plan:  [place]  CM consult      [facility] Choice  Resumption Of Svcs/PTA Provider   Choice offered to / List presented to:          [facility] arranged  HH-1 RN  IV Antibiotics      Jeffrey [name].   Status of service:  Completed, signed off Medicare Important Message given?   (If response is "NO", the following Medicare IM given date fields will be blank) Date Medicare IM given:   Medicare IM given by:   Date Additional Medicare IM given:   Additional Medicare IM given by:    Discharge Disposition:  [place]  Per UR Regulation:    If discussed at Long Length of Stay Meetings, dates discussed:    Comments:  08/20/14 14:50 CM met with pt and spouse, Jeffrey [name], to discuss [place] IV ABX.  Jeffrey [name] states they have just finished several weeks with [facility] and she feels comfortable running the IV ABX herself.  CM called [facility] rep, Jeffrey [name] who will arrange a HHRN to followup and reinforce teaching of the running of the IV ABX.  RN called MD and got order for Vanc to be run q12hrs with start date of this pm at 21:00 hours and q12 hrs through the 09/13/14 21:00 run as STOP time.  Orders for PICC line manitenance, Labs, and dosing per [facility] pharmacy. Request for F-2-F made.  Jeffrey [name] on unit and accepted prescription. [facility] will deliver this evening's run to pt's house.   No other CM needs were communicated. Jeffrey [name], BSN, CM [phone].

## 2014-08-20 NOTE — Discharge Instructions (Signed)
Change dressing left knee daily. Resume home IV antibiotics (Vancomycin)

## 2014-08-20 NOTE — Progress Notes (Signed)
Physical Therapy Treatment Patient Details Name: Jeffrey [name] MRN: [id number] DOB: 27-Jan-1934 Today's Date: 08/20/2014    History of Present Illness 80 y.o. male w/ PMHx of HLD, HTN, CAD, GERD, h/o ITP, Hypothyroidism, anxiety and depression, admitted for recurrent painful left knee effusion.  pt is s/p I&D and synovectomy.     PT Comments    Pt cont's to progress with mobility & PT goals.  Ambulated from room<>gym & completed stair training this session.  Pt reports pain better controlled & increased tolerance to [place].     Follow Up Recommendations  Home health PT;Supervision/Assistance - 24 hour     Equipment Recommendations  None recommended by PT    Recommendations for Other Services       Precautions / Restrictions Precautions Precautions: Knee Restrictions LLE Weight Bearing: Weight bearing as tolerated    Mobility  Bed Mobility Overal bed mobility: Modified Independent Bed Mobility: Supine to Sit     Supine to sit: HOB elevated     General bed mobility comments: HOB elevated with use of rail.    Transfers Overall transfer level: Needs assistance Equipment used: Rolling walker (2 wheeled) Transfers: Sit to/from Stand Sit to Stand: Supervision         General transfer comment: cues to reinforce safe body positioning before sitting & for controlled descent.   Ambulation/Gait Ambulation/Gait assistance: Min guard Ambulation Distance (Feet): 160 Feet (80'x2) Assistive device: Rolling walker (2 wheeled) Gait Pattern/deviations: Step-through pattern;Decreased stride length;Decreased step length - right;Decreased weight shift to left Gait velocity: decreased   General Gait Details: cues for increase heel strike LLE, for increased step/stride length, & increased WBing LLE however, does appear pt tolerating increased WBing LLE today   Stairs Stairs: Yes Stairs assistance: Min guard Stair Management: One rail Left;Step to pattern;Forwards Number of  Stairs: 3 General stair comments: cues for sequencing & technique.    Wheelchair Mobility    Modified Rankin (Stroke Patients Only)       Balance Overall balance assessment: Needs assistance         Standing balance support: During functional activity;No upper extremity supported Standing balance-Leahy Scale: Good Standing balance comment: able to stand at sink & perform ADL's without UE support                    Cognition Arousal/Alertness: Awake/alert Behavior During Therapy: WFL for tasks assessed/performed Overall Cognitive Status: Within Functional Limits for tasks assessed                      Exercises      General Comments        Pertinent Vitals/Pain Pain Assessment: 0-10 Pain Score: 3  Pain Location: Lt knee Pain Descriptors / Indicators: Discomfort;Dull;Other (Comment) ("it's not bad") Pain Intervention(s): Repositioned;Monitored during session    Home Living                      Prior Function            PT Goals (current goals can now be found in the care plan section) Acute Rehab PT Goals PT Goal Formulation: With patient Time For Goal Achievement: 08/21/14 Potential to Achieve Goals: Good Progress towards PT goals: Progressing toward goals    Frequency  Min 5X/week    PT Plan Current plan remains appropriate    Co-evaluation             End of Session Equipment Utilized During  Treatment: Left knee immobilizer Activity Tolerance: Patient tolerated treatment well Patient left: in chair;with call bell/phone within reach     Time: 0802-0828 PT Time Calculation (min): 26 min  Charges:  $Gait Training: 23-37 mins                    G Codes:      Sena Hitch 08/20/2014, 8:40 AM   Sarajane Marek, PTA [phone] 08/20/2014

## 2014-08-21 ENCOUNTER — Telehealth: Payer: Self-pay | Admitting: *Deleted

## 2014-08-21 NOTE — Anesthesia Postprocedure Evaluation (Signed)
  Anesthesia Post-op Note  Patient: Jeffrey [name]  Procedure(s) Performed: Procedure(s): SYNOVECTOMY LEFT KNEE (Left)  Pt discharged from PACU with no apparent anesthetic complications

## 2014-08-21 NOTE — Telephone Encounter (Signed)
Verdis Frederickson, RN with [facility] called to report that there was an error last night when wife administered the vancomycin; she gave only one bag of the vanc at 750 mg and did not give the other 500 mg. RN will do a lab draw tomorrow instead after the patient has gotten his full dose. Patient will be scheduled a hospital follow up appointment in three weeks.

## 2014-08-21 NOTE — Telephone Encounter (Signed)
Ok

## 2014-08-22 LAB — ANAEROBIC CULTURE

## 2014-08-22 NOTE — Care Management Note (Signed)
08/22/14 09:10 CM made f/u call to office of Milly Jakob, MD to please fax (or put in [facility]) requested F2F.  Mariane Masters, BSN, CM [phone].

## 2014-09-04 ENCOUNTER — Ambulatory Visit (INDEPENDENT_AMBULATORY_CARE_PROVIDER_SITE_OTHER): Payer: Medicare Other | Admitting: Internal Medicine

## 2014-09-04 ENCOUNTER — Telehealth: Payer: Self-pay | Admitting: *Deleted

## 2014-09-04 ENCOUNTER — Encounter: Payer: Self-pay | Admitting: Internal Medicine

## 2014-09-04 VITALS — Ht 73.0 in | Wt 224.0 lb

## 2014-09-04 DIAGNOSIS — I25810 Atherosclerosis of coronary artery bypass graft(s) without angina pectoris: Secondary | ICD-10-CM

## 2014-09-04 DIAGNOSIS — Z23 Encounter for immunization: Secondary | ICD-10-CM

## 2014-09-04 DIAGNOSIS — M009 Pyogenic arthritis, unspecified: Secondary | ICD-10-CM

## 2014-09-04 MED ORDER — DOXYCYCLINE HYCLATE 100 MG PO TABS: 100.0000 mg | ORAL_TABLET | Freq: Two times a day (BID) | ORAL | Status: DC

## 2014-09-04 NOTE — Telephone Encounter (Signed)
Spoke with Jeani Hawking at [facility].  Per Dr. Baxter Flattery, patient's PICC can be removed after his dose of vancomycin on 9/23.  Patient is to continue on oral doxycycline starting 9/24.  Patient will follow up 10/1. Landis Gandy

## 2014-09-11 NOTE — Discharge Summary (Signed)
Patient ID: Jeffrey [name] MRN: [id number] DOB/AGE: 78/17/1935 78 y.o.  Admit date: 08/17/2014 Discharge date: 08/20/14  Admission Diagnoses:  Active Problems:   Bacterial infection of knee joint   Discharge Diagnoses:  Same  Past Medical History  Diagnosis Date  . High cholesterol   . GERD (gastroesophageal reflux disease)   . Anxiety   . Coronary artery disease   . Shortness of breath   . Hypothyroidism   . H/O hiatal hernia   . Ischemic cardiomyopathy   . Aortic stenosis     mild AS by 07/20/13 echo ([facility])  . ITP (idiopathic thrombocytopenic purpura)     Dr. Gaynelle Arabian, on [place]  . Myocardial infarction 03-17-10  . Pneumonia 1940's X 1; 03-17-2014 X 1  . History of blood transfusion reaction 2013/03/17    "he almost died; he has to get irradiated blood next time"  . Arthritis     "all over"  . Depression     Surgeries: Procedure(s): SYNOVECTOMY LEFT KNEE on 08/17/2014   Consultants:    Discharged Condition: Improved  Hospital Course: Jeffrey [name] is an 80 y.o. male who was admitted 08/17/2014 for operative treatment of<principal problem not specified>. Patient has severe unremitting pain that affects sleep, daily activities, and work/hobbies. After pre-op clearance the patient was taken to the operating room on 08/17/2014 and underwent  Procedure(s): SYNOVECTOMY LEFT KNEE.    Patient was given perioperative antibiotics:  Anti-infectives   Start     Dose/Rate Route Frequency Ordered Stop   08/19/14 2300  vancomycin (VANCOCIN) 1,250 mg in sodium chloride 0.9 % 250 mL IVPB  Status:  Discontinued     1,250 mg 166.7 mL/hr over 90 Minutes Intravenous Every 12 hours 08/19/14 03/18/23 08/20/14 03/17/06   08/18/14 0330  vancomycin (VANCOCIN) IVPB 1000 mg/200 mL premix  Status:  Discontinued     1,000 mg 200 mL/hr over 60 Minutes Intravenous Every 12 hours 08/17/14 1507 08/19/14 2223   08/17/14 1515  vancomycin (VANCOCIN) 2,000 mg in sodium chloride 0.9 % 500 mL IVPB      2,000 mg 250 mL/hr over 120 Minutes Intravenous  Once 08/17/14 1507 08/17/14 1814   08/17/14 1200  polymyxin B 500,000 Units, bacitracin 50,000 Units in sodium chloride irrigation 0.9 % 500 mL irrigation  Status:  Discontinued       As needed 08/17/14 1200 08/17/14 1328   08/17/14 1130  ceFAZolin (ANCEF) 2-3 GM-% IVPB SOLR    Comments:  Elliott, Beth   : cabinet override      08/17/14 1130 08/17/14 1215   08/17/14 0600  ceFAZolin (ANCEF) IVPB 2 g/50 mL premix  Status:  Discontinued     2 g 100 mL/hr over 30 Minutes Intravenous On call to O.R. 08/16/14 1413 08/17/14 1424       Patient was given sequential compression devices, early ambulation, and chemoprophylaxis to prevent DVT.  Patient benefited maximally from hospital stay and there were no complications.    Recent vital signs: No data found.    Recent laboratory studies: No results found for this basename: WBC, HGB, HCT, PLT, NA, K, CL, CO2, BUN, CREATININE, GLUCOSE, PT, INR, CALCIUM, 2,  in the last 72 hours   Discharge Medications:     Medication List         ALPRAZolam 0.5 MG tablet  Commonly known as:  XANAX  Take 0.5 mg by mouth 3 (three) times daily as needed for anxiety.     aspirin EC 81  MG tablet  Take 81 mg by mouth daily.     eltrombopag 25 MG tablet  Commonly known as:  PROMACTA  Take 25 mg by mouth 2 (two) times a week. Take on an empty stomach, 1 hour before a meal or 2 hours after.     fenofibrate 145 MG tablet  Commonly known as:  TRICOR  Take 145 mg by mouth daily.     FISH OIL PO  Take 1 capsule by mouth daily.     FLUoxetine 10 MG capsule  Commonly known as:  PROZAC  Take 10 mg by mouth at bedtime.     fluticasone-salmeterol 230-21 MCG/ACT inhaler  Commonly known as:  ADVAIR HFA  Inhale 1 puff into the lungs 2 (two) times daily.     gabapentin 100 MG capsule  Commonly known as:  NEURONTIN  Take 100 mg by mouth 3 (three) times daily.     Hydrocodone-Acetaminophen 10-300 MG Tabs  Take  0.5-1 tablets by mouth 2 (two) times daily as needed (for pain).     levothyroxine 175 MCG tablet  Commonly known as:  SYNTHROID, LEVOTHROID  Take 175 mcg by mouth at bedtime.     losartan 25 MG tablet  Commonly known as:  COZAAR  Take 25 mg by mouth daily.     metoprolol succinate 25 MG 24 hr tablet  Commonly known as:  TOPROL-XL  Take 25 mg by mouth daily.     omeprazole 40 MG capsule  Commonly known as:  PRILOSEC  Take 40 mg by mouth daily.     oxyCODONE-acetaminophen 5-325 MG per tablet  Commonly known as:  ROXICET  Take 1-2 tablets by mouth every 4 (four) hours as needed.        Diagnostic Studies: No results found.  Disposition: 06-Home-Health Care Svc        Follow-up Information   Follow up with Hessie Dibble, MD.   Specialty:  Orthopedic Surgery   Contact information:   [place] [id number] [phone]       Follow up with [facility]. (home health nurse for IV ABX and PICC line maintenance)    Contact information:   [place] [address] [phone]        Signed: Rich Fuchs 09/11/2014, 9:45 AM

## 2014-09-20 NOTE — Progress Notes (Signed)
Subjective:    Patient ID: Jeffrey [name], male    DOB: 1934-03-31, 79 y.o.   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 He had previously finished 4 wk of IV vancomycin and then had recurrence of left knee effusion and pain. He was readmitted for I x D and synevectomy on 8/23. Cultures were again negative. He was discharged on vancomycin for 4 wk to end of September 23rd with the plan to convert to doxycycline 100mg  BID for addn 4 wks. He states that he is recovering well from his surgery. No fevers. Mild pain to his knee  Allergies  Allergen Reactions  . Lipitor [Atorvastatin] Other (See Comments)    cramps  . Methylprednisolone Other (See Comments)    cramps   Current Outpatient Prescriptions on File Prior to Visit  Medication Sig Dispense Refill  . ALPRAZolam (XANAX) 0.5 MG tablet Take 0.5 mg by mouth 3 (three) times daily as needed for anxiety.       [name] Kitchen aspirin EC 81 MG tablet Take 81 mg by mouth daily.      [name] Kitchen eltrombopag (PROMACTA) 25 MG tablet Take 25 mg by mouth 2 (two) times a week. Take on an empty stomach, 1 hour before a meal or 2 hours after.      . fenofibrate (TRICOR) 145 MG tablet Take 145 mg by mouth daily.      [name] Kitchen FLUoxetine (PROZAC) 10 MG capsule Take 10 mg by mouth at bedtime.       . fluticasone-salmeterol (ADVAIR HFA) 230-21 MCG/ACT inhaler Inhale 1 puff into the lungs 2 (two) times daily.       [name] Kitchen gabapentin (NEURONTIN) 100 MG capsule Take 100 mg by mouth 3 (three) times daily.      . Hydrocodone-Acetaminophen 10-300 MG TABS Take 0.5-1 tablets by mouth 2 (two) times daily as needed (for pain).       [name] Kitchen levothyroxine (SYNTHROID, LEVOTHROID) 175 MCG tablet Take 175 mcg by mouth at bedtime.       [name] Kitchen losartan (COZAAR) 25 MG tablet Take 25 mg by mouth daily.      . metoprolol succinate (TOPROL-XL) 25 MG 24 hr tablet Take 25 mg by mouth daily.      . Omega-3 Fatty Acids (FISH OIL PO) Take 1 capsule by mouth daily.       [name] Kitchen omeprazole (PRILOSEC) 40 MG capsule Take 40 mg by mouth daily.      [name] Kitchen oxyCODONE-acetaminophen (ROXICET) 5-325 MG per tablet Take 1-2 tablets by mouth every 4 (four) hours as needed.  60 tablet  0   No current facility-administered medications on file prior to visit.   Active Ambulatory Problems    Diagnosis Date Noted  . HIP PAIN 11/15/2007  . 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 Effusion of knee joint, left 10/11/2012  . OA (osteoarthritis) of knee 10/11/2012  . CAP (community acquired pneumonia) 01/26/2014  . CAD (coronary artery disease) of artery bypass graft 01/26/2014  . Unspecified hypothyroidism 01/26/2014  . Thrombocytopenia, unspecified 01/26/2014  . Nausea alone 01/26/2014  . Septic joint of left knee joint 06/22/2014  . Bacterial infection of knee joint 08/17/2014   Resolved Ambulatory Problems    Diagnosis Date Noted  . No Resolved Ambulatory Problems   Past Medical History  Diagnosis Date  . High cholesterol   . GERD (gastroesophageal reflux disease)   . Anxiety   . Coronary  artery disease   . Shortness of breath   . Hypothyroidism   . H/O hiatal hernia   . Ischemic cardiomyopathy   . Aortic stenosis   . ITP (idiopathic thrombocytopenic purpura)   . Myocardial infarction 2011  . Pneumonia 1940's X 1; 2015 X 1  . History of blood transfusion reaction 2014  . Arthritis   . Depression      Review of Systems     Objective:   Physical Exam Ht 6\' 1"  (1.854 m)  Wt 224 lb (101.606 kg)  BMI 29.56 kg/m2 Physical Exam  Constitutional: He is oriented to person, place, and time. He appears well-developed and well-nourished. No distress.  HENT:  Mouth/Throat: Oropharynx is clear and moist. No oropharyngeal exudate.  Lymphadenopathy:  He has no cervical adenopathy.  Ext: left knee no warmth or effusion. Surgical site is clean Skin: Skin is warm and dry. No rash noted. No erythema.        Assessment & Plan:  Septic arthritis  s/p i x d, synevectomy = continue on vancomycin til sept 23rd and plan to have addn 4 wk with oral doxycycline.

## 2014-09-21 ENCOUNTER — Ambulatory Visit (INDEPENDENT_AMBULATORY_CARE_PROVIDER_SITE_OTHER): Payer: Medicare Other | Admitting: Internal Medicine

## 2014-09-21 ENCOUNTER — Encounter: Payer: Self-pay | Admitting: Internal Medicine

## 2014-09-21 VITALS — BP 144/75 | HR 67 | Temp 97.8°F | Ht 73.0 in | Wt 224.0 lb

## 2014-09-21 DIAGNOSIS — M009 Pyogenic arthritis, unspecified: Secondary | ICD-10-CM

## 2014-09-21 DIAGNOSIS — I25810 Atherosclerosis of coronary artery bypass graft(s) without angina pectoris: Secondary | ICD-10-CM

## 2014-09-21 LAB — CBC WITH DIFFERENTIAL/PLATELET
Basophils Absolute: 0.1 10*3/uL (ref 0.0–0.1)
Basophils Relative: 1 % (ref 0–1)
EOS ABS: 0.5 10*3/uL (ref 0.0–0.7)
EOS PCT: 7 % — AB (ref 0–5)
HEMATOCRIT: 37.7 % — AB (ref 39.0–52.0)
Hemoglobin: 12.9 g/dL — ABNORMAL LOW (ref 13.0–17.0)
LYMPHS ABS: 2.6 10*3/uL (ref 0.7–4.0)
Lymphocytes Relative: 34 % (ref 12–46)
MCH: 30 pg (ref 26.0–34.0)
MCHC: 34.2 g/dL (ref 30.0–36.0)
MCV: 87.7 fL (ref 78.0–100.0)
Monocytes Absolute: 1.1 10*3/uL — ABNORMAL HIGH (ref 0.1–1.0)
Monocytes Relative: 14 % — ABNORMAL HIGH (ref 3–12)
Neutro Abs: 3.3 10*3/uL (ref 1.7–7.7)
Neutrophils Relative %: 44 % (ref 43–77)
Platelets: 331 10*3/uL (ref 150–400)
RBC: 4.3 MIL/uL (ref 4.22–5.81)
RDW: 15 % (ref 11.5–15.5)
WBC: 7.5 10*3/uL (ref 4.0–10.5)

## 2014-09-22 LAB — C-REACTIVE PROTEIN: CRP: <0.5 mg/dL (ref <0.60)

## 2014-09-22 LAB — SEDIMENTATION RATE: Sed Rate: 26 mm/hr — ABNORMAL HIGH (ref 0–16)

## 2014-09-22 NOTE — Progress Notes (Signed)
Subjective:    Patient ID: Jeffrey [name], male    DOB: March 01, 1934, 80 y.o.   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 He had previously finished 4 wk of IV vancomycin and then had recurrence of left knee effusion and pain. He was readmitted for I x D and synevectomy on 8/23. Cultures were again negative. He was discharged on vancomycin for 4 wk to end of September 23rd and now on doxycycline 100mg  BID. His knee has healed well from the surgery but still has warmth to touch especially with activity. He notices swelling worsens with greater physical activity. He senses "bone on bone" sometimes when walking then shortly thereafter has swelling to the joint. No fevers.  Or redness to the left knee.  Allergies  Allergen Reactions  . Lipitor [Atorvastatin] Other (See Comments)    cramps  . Methylprednisolone Other (See Comments)    cramps   Current Outpatient Prescriptions on File Prior to Visit  Medication Sig Dispense Refill  . ALPRAZolam (XANAX) 0.5 MG tablet Take 0.5 mg by mouth 3 (three) times daily as needed for anxiety.       [name] Kitchen aspirin EC 81 MG tablet Take 81 mg by mouth daily.      [name] Kitchen doxycycline (VIBRA-TABS) 100 MG tablet Take 1 tablet (100 mg total) by mouth 2 (two) times daily. Start on 09/14/14  60 tablet  1  . eltrombopag (PROMACTA) 25 MG tablet Take 25 mg by mouth 2 (two) times a week. Take on an empty stomach, 1 hour before a meal or 2 hours after.      . fenofibrate (TRICOR) 145 MG tablet Take 145 mg by mouth daily.      [name] Kitchen FLUoxetine (PROZAC) 10 MG capsule Take 10 mg by mouth at bedtime.       . fluticasone-salmeterol (ADVAIR HFA) 230-21 MCG/ACT inhaler Inhale 1 puff into the lungs 2 (two) times daily.       [name] Kitchen gabapentin (NEURONTIN) 100 MG capsule Take 100 mg by mouth 3 (three) times daily.      . Hydrocodone-Acetaminophen 10-300 MG TABS Take 0.5-1 tablets by mouth 2 (two) times daily as needed (for pain).       [name] Kitchen  levothyroxine (SYNTHROID, LEVOTHROID) 175 MCG tablet Take 175 mcg by mouth at bedtime.       [name] Kitchen losartan (COZAAR) 25 MG tablet Take 25 mg by mouth daily.      . metoprolol succinate (TOPROL-XL) 25 MG 24 hr tablet Take 25 mg by mouth daily.      . Omega-3 Fatty Acids (FISH OIL PO) Take 1 capsule by mouth daily.      [name] Kitchen omeprazole (PRILOSEC) 40 MG capsule Take 40 mg by mouth daily.      [name] Kitchen oxyCODONE-acetaminophen (ROXICET) 5-325 MG per tablet Take 1-2 tablets by mouth every 4 (four) hours as needed.  60 tablet  0  . vancomycin (VANCOCIN) 1 GM/200ML SOLN        No current facility-administered medications on file prior to visit.   Active Ambulatory Problems    Diagnosis Date Noted  . HIP PAIN 11/15/2007  . 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 Effusion of knee joint, left 10/11/2012  . OA (osteoarthritis) of knee 10/11/2012  . CAP (community acquired pneumonia) 01/26/2014  . CAD (coronary artery disease) of artery bypass graft 01/26/2014  . Unspecified hypothyroidism 01/26/2014  . Thrombocytopenia, unspecified 01/26/2014  .  Nausea alone 01/26/2014  . Septic joint of left knee joint 06/22/2014  . Bacterial infection of knee joint 08/17/2014   Resolved Ambulatory Problems    Diagnosis Date Noted  . No Resolved Ambulatory Problems   Past Medical History  Diagnosis Date  . High cholesterol   . GERD (gastroesophageal reflux disease)   . Anxiety   . Coronary artery disease   . Shortness of breath   . Hypothyroidism   . H/O hiatal hernia   . Ischemic cardiomyopathy   . Aortic stenosis   . ITP (idiopathic thrombocytopenic purpura)   . Myocardial infarction 2011  . Pneumonia 1940's X 1; 2015 X 1  . History of blood transfusion reaction 2014  . Arthritis   . Depression       Review of Systems Review of Systems  Constitutional: Negative for fever, chills, diaphoresis, activity change, appetite change, fatigue and unexpected weight change.  HENT: Negative for  congestion, sore throat, rhinorrhea, sneezing, trouble swallowing and sinus pressure.  Eyes: Negative for photophobia and visual disturbance.  Respiratory: Negative for cough, chest tightness, shortness of breath, wheezing and stridor.  Cardiovascular: Negative for chest pain, palpitations and leg swelling.  Gastrointestinal: Negative for nausea, vomiting, abdominal pain, diarrhea, constipation, blood in stool, abdominal distention and anal bleeding.  Genitourinary: Negative for dysuria, hematuria, flank pain and difficulty urinating.  Musculoskeletal: Negative for myalgias, back pain, joint swelling, arthralgias and gait problem.  Skin: Negative for color change, pallor, rash and wound.  Neurological: Negative for dizziness, tremors, weakness and light-headedness.  Hematological: Negative for adenopathy. Does not bruise/bleed easily.  Psychiatric/Behavioral: Negative for behavioral problems, confusion, sleep disturbance, dysphoric mood, decreased concentration and agitation.       Objective:   Physical Exam  BP 144/75  Pulse 67  Temp(Src) 97.8 F (36.6 C) (Oral)  Ht 6\' 1"  (1.854 m)  Wt 224 lb (101.606 kg)  BMI 29.56 kg/m2 Physical Exam  Constitutional: He is oriented to person, place, and time. He appears well-developed and well-nourished. No distress.  HENT:  Mouth/Throat: Oropharynx is clear and moist. No oropharyngeal exudate.  Cardiovascular: Normal rate, regular rhythm and normal heart sounds. Exam reveals no gallop and no friction rub.  No murmur heard.  Pulmonary/Chest: Effort normal and breath sounds normal. No respiratory distress. He has no wheezes.  Ext: left knee warm to touch with mild effusion Skin = no erythema  Lab Results  Component Value Date   ESRSEDRATE 26* 09/21/2014   Lab Results  Component Value Date   CRP <0.5 09/21/2014       Assessment & Plan:  Hx of MRSE septic arthritis s/p washout and synevectomy with 4 wk of vanco, now on doxycycline. Elizebeth Koller  out his 1 month course of doxycycline. Will check sed rate and crp to see if need to extend further.

## 2014-09-30 ENCOUNTER — Telehealth: Payer: Self-pay | Admitting: Infectious Disease

## 2014-09-30 MED ORDER — SULFAMETHOXAZOLE-TMP DS 800-160 MG PO TABS: 1.0000 | ORAL_TABLET | Freq: Two times a day (BID) | ORAL | Status: DC

## 2014-09-30 NOTE — Telephone Encounter (Signed)
Pt dev rash on doxy, switched to bactrim

## 2014-10-12 ENCOUNTER — Ambulatory Visit (INDEPENDENT_AMBULATORY_CARE_PROVIDER_SITE_OTHER): Payer: Medicare Other | Admitting: Internal Medicine

## 2014-10-12 ENCOUNTER — Encounter: Payer: Self-pay | Admitting: Internal Medicine

## 2014-10-12 VITALS — Temp 98.3°F | Wt 230.0 lb

## 2014-10-12 DIAGNOSIS — M009 Pyogenic arthritis, unspecified: Secondary | ICD-10-CM

## 2014-10-12 DIAGNOSIS — I25810 Atherosclerosis of coronary artery bypass graft(s) without angina pectoris: Secondary | ICD-10-CM

## 2014-10-12 LAB — BASIC METABOLIC PANEL WITH GFR
BUN: 25 mg/dL — ABNORMAL HIGH (ref 6–23)
CO2: 22 mEq/L (ref 19–32)
Calcium: 9.7 mg/dL (ref 8.4–10.5)
Chloride: 107 mEq/L (ref 96–112)
Creat: 1.79 mg/dL — ABNORMAL HIGH (ref 0.50–1.35)
GFR, EST AFRICAN AMERICAN: 41 mL/min — AB
GFR, Est Non African American: 36 mL/min — ABNORMAL LOW
GLUCOSE: 93 mg/dL (ref 70–99)
Potassium: 5.2 mEq/L (ref 3.5–5.3)
SODIUM: 139 meq/L (ref 135–145)

## 2014-10-12 NOTE — Progress Notes (Signed)
Subjective:    Patient ID: Jeffrey [name], male    DOB: September 04, 1934, 80 y.o.   MRN: [id number]  HPI Aizen is a 80yo M with history of recurrent MRSE septic arthritis of left knee. He had previously finished 4 wk of IV vancomycin and then had recurrence of left knee effusion and pain. He was readmitted for I x D and synevectomy on 8/23. Cultures were again negative. He was discharged on vancomycin for 4 wk to end of September 23rd and now on doxycycline 100mg  BID which he took until oct 10th when he developed a rash. He was switched to bactrim ds 1 tab bid which he has been taking for the past < 2 wks.  His left knee is well healed but now having right ankle arthritis flare.  Lab Results  Component Value Date   CRP <0.5 09/21/2014   Lab Results  Component Value Date   ESRSEDRATE 26* 09/21/2014   Current Outpatient Prescriptions on File Prior to Visit  Medication Sig Dispense Refill  . ALPRAZolam (XANAX) 0.5 MG tablet Take 0.5 mg by mouth 3 (three) times daily as needed for anxiety.       [name] Kitchen aspirin EC 81 MG tablet Take 81 mg by mouth daily.      [name] Kitchen eltrombopag (PROMACTA) 25 MG tablet Take 25 mg by mouth 2 (two) times a week. Take on an empty stomach, 1 hour before a meal or 2 hours after.      . fenofibrate (TRICOR) 145 MG tablet Take 145 mg by mouth daily.      [name] Kitchen FLUoxetine (PROZAC) 10 MG capsule Take 10 mg by mouth at bedtime.       . fluticasone-salmeterol (ADVAIR HFA) 230-21 MCG/ACT inhaler Inhale 1 puff into the lungs 2 (two) times daily.       [name] Kitchen gabapentin (NEURONTIN) 100 MG capsule Take 100 mg by mouth 3 (three) times daily.      . Hydrocodone-Acetaminophen 10-300 MG TABS Take 0.5-1 tablets by mouth 2 (two) times daily as needed (for pain).       [name] Kitchen levothyroxine (SYNTHROID, LEVOTHROID) 175 MCG tablet Take 175 mcg by mouth at bedtime.       [name] Kitchen losartan (COZAAR) 25 MG tablet Take 25 mg by mouth daily.      . metoprolol succinate (TOPROL-XL) 25 MG 24 hr tablet Take 25 mg by mouth daily.       . Omega-3 Fatty Acids (FISH OIL PO) Take 1 capsule by mouth daily.      [name] Kitchen omeprazole (PRILOSEC) 40 MG capsule Take 40 mg by mouth daily.      [name] Kitchen oxyCODONE-acetaminophen (ROXICET) 5-325 MG per tablet Take 1-2 tablets by mouth every 4 (four) hours as needed.  60 tablet  0  . sulfamethoxazole-trimethoprim (BACTRIM DS) 800-160 MG per tablet Take 1 tablet by mouth 2 (two) times daily.  60 tablet  2   No current facility-administered medications on file prior to visit.   Active Ambulatory Problems    Diagnosis Date Noted  . HIP PAIN 11/15/2007  . 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 Effusion of knee joint, left 10/11/2012  . OA (osteoarthritis) of knee 10/11/2012  . CAP (community acquired pneumonia) 01/26/2014  . CAD (coronary artery disease) of artery bypass graft 01/26/2014  . Unspecified hypothyroidism 01/26/2014  . Thrombocytopenia, unspecified 01/26/2014  . Nausea alone 01/26/2014  . Septic joint of left knee joint 06/22/2014  . Bacterial infection of knee joint 08/17/2014  Resolved Ambulatory Problems    Diagnosis Date Noted  . No Resolved Ambulatory Problems   Past Medical History  Diagnosis Date  . High cholesterol   . GERD (gastroesophageal reflux disease)   . Anxiety   . Coronary artery disease   . Shortness of breath   . Hypothyroidism   . H/O hiatal hernia   . Ischemic cardiomyopathy   . Aortic stenosis   . ITP (idiopathic thrombocytopenic purpura)   . Myocardial infarction 2011  . Pneumonia 1940's X 1; 2015 X 1  . History of blood transfusion reaction 2014  . Arthritis   . Depression      Review of Systems No fever, chills, swelling of knee, pain of knee, diarrhea, or rash    Objective:   Physical Exam Temp(Src) 98.3 F (36.8 C) (Oral)  Wt 230 lb (104.327 kg) gen = a xo by 3 in nad Skin = left knee well healed incision. No effusion. No warmth       Assessment & Plan:  MRSE septic arhtritis = finish up course of bactrim in  the next 1-2 days. Will check sed rate and bmp today. Likely does not need further antibiotics.  rtc prn

## 2014-10-13 LAB — SEDIMENTATION RATE: Sed Rate: 17 mm/hr — ABNORMAL HIGH (ref 0–16)

## 2014-10-16 ENCOUNTER — Telehealth: Payer: Self-pay

## 2014-10-16 NOTE — Telephone Encounter (Signed)
Patient calling for lab results.   Please advise.

## 2014-10-16 NOTE — Telephone Encounter (Signed)
i called him back and let him know no need for abtx but rec repeat bmp in 1 wk

## 2014-11-14 ENCOUNTER — Other Ambulatory Visit: Payer: Self-pay | Admitting: Orthopaedic Surgery

## 2014-11-21 NOTE — Pre-Procedure Instructions (Signed)
XYLON CROOM  11/21/2014   Your procedure is scheduled on:  Friday, December 4th  Report to [facility] Admitting at 11 AM.  Call this number if you have problems the morning of surgery: [phone]   Remember:   Do not eat food or drink liquids after midnight.   Take these medicines the morning of surgery with A SIP OF WATER: neurontin, toprol, prilosec, advair, synthroid  Stop taking aspirin, OTC vitamins/herbal medications, NSAIDS (ibuprofen, advil, motrin) 7 days prior to surgery.   Do not wear jewelry.  Do not wear lotions, powders, or perfumes. You may wear deodorant.  Do not shave 48 hours prior to surgery. Men may shave face and neck.  Do not bring valuables to the hospital.  [facility] is not responsible   for any belongings or valuables.               Contacts, dentures or bridgework may not be worn into surgery.  Leave suitcase in the car. After surgery it may be brought to your room.  For patients admitted to the hospital, discharge time is determined by your  treatment team.  Please read over the following fact sheets that you were given: Pain Booklet, Coughing and Deep Breathing, Blood Transfusion Information, MRSA Information and Surgical Site Infection Prevention  [facility] - Preparing for Surgery  Before surgery, you can play an important role.  Because skin is not sterile, your skin needs to be as free of germs as possible.  You can reduce the number of germs on you skin by washing with CHG (chlorahexidine gluconate) soap before surgery.  CHG is an antiseptic cleaner which kills germs and bonds with the skin to continue killing germs even after washing.  Please DO NOT use if you have an allergy to CHG or antibacterial soaps.  If your skin becomes reddened/irritated stop using the CHG and inform your nurse when you arrive at Short Stay.  Do not shave (including legs and underarms) for at least 48 hours prior to the first CHG shower.  You may shave your  face.  Please follow these instructions carefully:   1.  Shower with CHG Soap the night before surgery and the morning of Surgery.  2.  If you choose to wash your hair, wash your hair first as usual with your normal shampoo.  3.  After you shampoo, rinse your hair and body thoroughly to remove the shampoo.  4.  Use CHG as you would any other liquid soap.  You can apply CHG directly to the skin and wash gently with scrungie or a clean washcloth.  5.  Apply the CHG Soap to your body ONLY FROM THE NECK DOWN.  Do not use on open wounds or open sores.  Avoid contact with your eyes, ears, mouth and genitals (private parts).  Wash genitals (private parts) with your normal soap.  6.  Wash thoroughly, paying special attention to the area where your surgery will be performed.  7.  Thoroughly rinse your body with warm water from the neck down.  8.  DO NOT shower/wash with your normal soap after using and rinsing off the CHG Soap.  9.  Pat yourself dry with a clean towel.            10.  Wear clean pajamas.            11.  Place clean sheets on your bed the night of your first shower and do not sleep  with pets.  Day of Surgery  Do not apply any lotions/deoderants the morning of surgery.  Please wear clean clothes to the hospital/surgery center.

## 2014-11-22 ENCOUNTER — Encounter (HOSPITAL_COMMUNITY): Payer: Self-pay

## 2014-11-22 ENCOUNTER — Encounter (HOSPITAL_COMMUNITY)
Admission: RE | Admit: 2014-11-22 | Discharge: 2014-11-22 | Disposition: A | Discharge status: Home / Self Care | Payer: Medicare Other | Source: Ambulatory Visit | Attending: Orthopaedic Surgery | Admitting: Orthopaedic Surgery

## 2014-11-22 LAB — BASIC METABOLIC PANEL
Anion gap: 15 (ref 5–15)
BUN: 15 mg/dL (ref 6–23)
CHLORIDE: 100 meq/L (ref 96–112)
CO2: 25 mEq/L (ref 19–32)
Calcium: 9.5 mg/dL (ref 8.4–10.5)
Creatinine, Ser: 1.26 mg/dL (ref 0.50–1.35)
GFR calc non Af Amer: 53 mL/min — ABNORMAL LOW (ref >90)
GFR, EST AFRICAN AMERICAN: 61 mL/min — AB (ref >90)
Glucose, Bld: 144 mg/dL — ABNORMAL HIGH (ref 70–99)
POTASSIUM: 4.2 meq/L (ref 3.7–5.3)
SODIUM: 140 meq/L (ref 137–147)

## 2014-11-22 LAB — CBC WITH DIFFERENTIAL/PLATELET
Basophils Absolute: 0 10*3/uL (ref 0.0–0.1)
Basophils Relative: 0 % (ref 0–1)
Eosinophils Absolute: 0.3 10*3/uL (ref 0.0–0.7)
Eosinophils Relative: 4 % (ref 0–5)
HCT: 40.8 % (ref 39.0–52.0)
HEMOGLOBIN: 14.1 g/dL (ref 13.0–17.0)
LYMPHS ABS: 2.8 10*3/uL (ref 0.7–4.0)
LYMPHS PCT: 31 % (ref 12–46)
MCH: 30.7 pg (ref 26.0–34.0)
MCHC: 34.6 g/dL (ref 30.0–36.0)
MCV: 88.7 fL (ref 78.0–100.0)
MONOS PCT: 9 % (ref 3–12)
Monocytes Absolute: 0.8 10*3/uL (ref 0.1–1.0)
NEUTROS PCT: 56 % (ref 43–77)
Neutro Abs: 5 10*3/uL (ref 1.7–7.7)
Platelets: 274 10*3/uL (ref 150–400)
RBC: 4.6 MIL/uL (ref 4.22–5.81)
RDW: 14.3 % (ref 11.5–15.5)
WBC: 9 10*3/uL (ref 4.0–10.5)

## 2014-11-22 LAB — URINALYSIS, ROUTINE W REFLEX MICROSCOPIC
Bilirubin Urine: NEGATIVE
Glucose, UA: NEGATIVE mg/dL
HGB URINE DIPSTICK: NEGATIVE
KETONES UR: NEGATIVE mg/dL
Leukocytes, UA: NEGATIVE
NITRITE: NEGATIVE
Protein, ur: NEGATIVE mg/dL
Specific Gravity, Urine: 1.025 (ref 1.005–1.030)
UROBILINOGEN UA: 0.2 mg/dL (ref 0.0–1.0)
pH: 5 (ref 5.0–8.0)

## 2014-11-22 LAB — PROTIME-INR
INR: 0.93 (ref 0.00–1.49)
PROTHROMBIN TIME: 12.6 s (ref 11.6–15.2)

## 2014-11-22 LAB — SURGICAL PCR SCREEN
MRSA, PCR: NEGATIVE
STAPHYLOCOCCUS AUREUS: NEGATIVE

## 2014-11-22 LAB — ABO/RH: ABO/RH(D): A POS

## 2014-11-22 LAB — APTT: aPTT: 32 seconds (ref 24–37)

## 2014-11-22 MED ORDER — CHLORHEXIDINE GLUCONATE 4 % EX LIQD: 60.0000 mL | Freq: Once | CUTANEOUS | Status: DC

## 2014-11-22 NOTE — H&P (Signed)
TOTAL KNEE ADMISSION H&P  Patient is being admitted for left total knee arthroplasty.  Subjective:  Chief Complaint:left knee pain.  HPI: Jeffrey [name], 80 y.o. male, has a history of pain and functional disability in the left knee due to arthritis and has failed non-surgical conservative treatments for greater than 12 weeks to includeNSAID's and/or analgesics, corticosteriod injections, flexibility and strengthening excercises, supervised PT with diminished ADL's post treatment, weight reduction as appropriate and activity modification.  Onset of symptoms was gradual, starting 5 years ago with gradually worsening course since that time. The patient noted prior procedures on the knee to include  arthroscopy on the left knee(s).  Patient currently rates pain in the left knee(s) at 10 out of 10 with activity. Patient has night pain, worsening of pain with activity and weight bearing, pain that interferes with activities of daily living, crepitus and joint swelling.  Patient has evidence of subchondral cysts, subchondral sclerosis, periarticular osteophytes and joint space narrowing by imaging studies. There is no active infection.  Patient Active Problem List   Diagnosis Date Noted  . Bacterial infection of knee joint 08/17/2014  . Septic joint of left knee joint 06/22/2014  . CAP (community acquired pneumonia) 01/26/2014  . CAD (coronary artery disease) of artery bypass graft 01/26/2014  . Unspecified hypothyroidism 01/26/2014  . Thrombocytopenia, unspecified 01/26/2014  . Nausea alone 01/26/2014  . Effusion of knee joint, left 10/11/2012  . OA (osteoarthritis) of knee 10/11/2012  . HIP PAIN 11/15/2007  . 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 High cholesterol   . GERD (gastroesophageal reflux disease)   . Anxiety   . Coronary artery disease   . Shortness of breath   . Hypothyroidism   . H/O hiatal hernia   . Ischemic  cardiomyopathy   . Aortic stenosis     mild AS by 07/20/13 echo ([facility])  . ITP (idiopathic thrombocytopenic purpura)     Dr. Gaynelle Arabian, on [place]  . Myocardial infarction 03/07/2010  . Pneumonia 1940's X 1; 07-Mar-2014 X 1  . History of blood transfusion reaction 2013/03/07    "he almost died; he has to get irradiated blood next time"  . Arthritis     "all over"  . Depression     Past Surgical History  Procedure Laterality Date  . Hernia repair    . Appendectomy    . Back surgery    . Knee arthroscopy Left 06/22/2014    w/I&D  . Hiatal hernia repair    . Coronary angioplasty with stent placement      "think I have 3-4 stents" (06/22/2014)  . Coronary artery bypass graft  2010/03/07    "CABG X3"  . Sternal closure      "wires from OHS taken out; plate put in" (6/0/1093)  . Lumbar disc surgery  1960's?  . Knee arthroscopy Left 06/22/2014    Procedure: IRRIGATION AND DEBRIDEMENT WITH CHRONDROPLASTY;  Surgeon: Hessie Dibble, MD;  Location: [place];  Service: Orthopedics;  Laterality: Left;  . Synovectomy Left 08/17/2014    Procedure: SYNOVECTOMY LEFT KNEE;  Surgeon: Hessie Dibble, MD;  Location: [place];  Service: Orthopedics;  Laterality: Left;    No prescriptions prior to admission   Allergies  Allergen Reactions  . Lipitor [Atorvastatin] Other (See Comments)    cramps  . Methylprednisolone Other (See Comments)    cramps  . Doxycycline Rash    Doxycycline caused itching  redness on scalp and head    History  Substance Use Topics  . Smoking status: Former Smoker -- 1.00 packs/day for 15 years    Types: Cigarettes  . Smokeless tobacco: Former Systems developer     Comment: "quit smoking ~ late ~ 60's; quit chewing in the 1970's"  . Alcohol Use: No    Family History  Problem Relation Age of Onset  . Heart disease    . Arthritis       Review of Systems  Musculoskeletal: Positive for joint pain.       Left knee  All other systems reviewed and are  negative.   Objective:  Physical Exam  Constitutional: He appears well-developed and well-nourished.  HENT:  Head: Normocephalic and atraumatic.  Eyes: Conjunctivae are normal. Pupils are equal, round, and reactive to light.  Neck: Normal range of motion.  Cardiovascular: Normal rate and regular rhythm.   Respiratory: Effort normal.    Vital signs in last 24 hours:    Labs:   Estimated body mass index is 30.35 kg/(m^2) as calculated from the following:   Height as of 09/21/14: 6\' 1"  (1.031 m).   Weight as of 10/12/14: 104.327 kg (230 lb).   Imaging Review Plain radiographs demonstrate severe degenerative joint disease of the left knee(s). The overall alignment isneutral. The bone quality appears to be good for age and reported activity level.  Assessment/Plan:  End stage primary arthritis, left knee   The patient history, physical examination, clinical judgment of the provider and imaging studies are consistent with end stage degenerative joint disease of the left knee(s) and total knee arthroplasty is deemed medically necessary. The treatment options including medical management, injection therapy arthroscopy and arthroplasty were discussed at length. The risks and benefits of total knee arthroplasty were presented and reviewed. The risks due to aseptic loosening, infection, stiffness, patella tracking problems, thromboembolic complications and other imponderables were discussed. The patient acknowledged the explanation, agreed to proceed with the plan and consent was signed. Patient is being admitted for inpatient treatment for surgery, pain control, PT, OT, prophylactic antibiotics, VTE prophylaxis, progressive ambulation and ADL's and discharge planning. The patient is planning to be discharged home with home health services

## 2014-11-23 MED ORDER — CEFAZOLIN SODIUM-DEXTROSE 2-3 GM-% IV SOLR
2.0000 g | INTRAVENOUS | Status: AC
  Administered 2014-11-24: 2 g via INTRAVENOUS
  Filled 2014-11-23: qty 50

## 2014-11-23 NOTE — Progress Notes (Signed)
Anesthesia Chart Review:  Patient is a 80 year old male scheduled for left TKA on 11/24/14 by Dr. Rhona Raider.  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 BMI is consistent with mild obesity. PCP is Dr. Miquel Dunn. Hematologist is Dr. Gaynelle Arabian (I spoke with his nurse Lauren prior to his 06/2014 surgery; He did not feel there was any hematologic contraindication for knee surgery at that time as patient's platelet counts had been stable on Promacta.)   Cardiologist is Dr. Carrolyn Leigh who felt patient could proceed with surgery without further cardiac work-up.    EKG on 05/12/14 (Dr. Gibson Ramp) showed NSR, non-specific lateral T wave flattening.   Nuclear stress test on 07/13/13 (Dr. Gibson Ramp) showed: Abnormal Lexiscan MPI demonstrating a large area of inferior, inferolateral, and lateral wall infarct with peri-infarct ischemia. LV is dilated with severely decreased systolic function and hypokinesis of inferior, inferolateral and lateral walls, calculated EF 36%.   Echo on 07/20/13 (Dr. Gibson Ramp) showed: Normal LV size and mild ot moderately decreased systolic function, EF 35-36%. Moderate concentric LVH. Hypokinesis of the inferolateral and lateral walls and apex. Normal RV size and systolic function. Mild AS (mean gradient 10 mmHg, peak velocity 2.16 m/sec) and mild AI.   Cardiac cath on 07/18/2013 [facility]; scanned under Media tab, Correspondence 06/22/14) showed: RA pressure mildly elevated at 7 mmHg. RV  pressure mildly elevated at 30/10 mmHg. PA pressure mildly elevated at 30/10 with a mean of 20 mmHg. PCWP mildly elevated at 15 mmHg. CO 9.0 L/min. CI normal at 3.9 L/min2. RCA non-dominant. LIMA to mid/distal LAD patent. LM 20-30%. Proximal LAD 95%, mid LAD 70-80%. Large DIAG with 60% ostial and 50% proximal stenosis. Proximal CX 10-20%. 40-50% mid CX. 30% distal CX. OM1 100% occluded with distal vessel filling via left to left collaterals. 40% OM2. PDA coming off of CX with 80% stenosis just prior to the touchdown of the known bypass graft. Known occlusion of SVG to OM and SVG to PDA. Medical therapy was recommended. Titrate b-blocker to try to increase competitive collateralization to the occluded OM vessel.  CXR on 06/20/14 showed: Stable mild cardiomegaly. No active lung disease.  Preoperative labs noted. PLT count 274K.   If no acute changes then I would anticipate that he could proceed as planned.  George Hugh [facility] Short Stay Center/Anesthesiology Phone [phone] 11/23/2014 11:50 AM

## 2014-11-24 ENCOUNTER — Encounter (HOSPITAL_COMMUNITY)
Admission: RE | Disposition: A | Discharge status: Home Health | Payer: Self-pay | Source: Ambulatory Visit | Attending: Orthopaedic Surgery

## 2014-11-24 ENCOUNTER — Inpatient Hospital Stay (HOSPITAL_COMMUNITY)
Admission: RE | Admit: 2014-11-24 | Discharge: 2014-11-26 | DRG: 470 | Disposition: A | Discharge status: Home Health | Payer: Medicare Other | Source: Ambulatory Visit | Attending: Orthopaedic Surgery | Admitting: Orthopaedic Surgery

## 2014-11-24 ENCOUNTER — Encounter (HOSPITAL_COMMUNITY): Payer: Self-pay | Admitting: *Deleted

## 2014-11-24 ENCOUNTER — Inpatient Hospital Stay (HOSPITAL_COMMUNITY): Payer: Medicare Other | Admitting: Anesthesiology

## 2014-11-24 ENCOUNTER — Inpatient Hospital Stay (HOSPITAL_COMMUNITY): Payer: Medicare Other | Admitting: Vascular Surgery

## 2014-11-24 DIAGNOSIS — D693 Immune thrombocytopenic purpura: Secondary | ICD-10-CM | POA: Diagnosis present

## 2014-11-24 DIAGNOSIS — Z87891 Personal history of nicotine dependence: Secondary | ICD-10-CM

## 2014-11-24 DIAGNOSIS — K219 Gastro-esophageal reflux disease without esophagitis: Secondary | ICD-10-CM | POA: Diagnosis present

## 2014-11-24 DIAGNOSIS — Z951 Presence of aortocoronary bypass graft: Secondary | ICD-10-CM | POA: Diagnosis not present

## 2014-11-24 DIAGNOSIS — I25810 Atherosclerosis of coronary artery bypass graft(s) without angina pectoris: Secondary | ICD-10-CM | POA: Diagnosis present

## 2014-11-24 DIAGNOSIS — I252 Old myocardial infarction: Secondary | ICD-10-CM

## 2014-11-24 DIAGNOSIS — I350 Nonrheumatic aortic (valve) stenosis: Secondary | ICD-10-CM | POA: Diagnosis present

## 2014-11-24 DIAGNOSIS — E780 Pure hypercholesterolemia: Secondary | ICD-10-CM | POA: Diagnosis present

## 2014-11-24 DIAGNOSIS — M1710 Unilateral primary osteoarthritis, unspecified knee: Secondary | ICD-10-CM | POA: Diagnosis present

## 2014-11-24 DIAGNOSIS — M1712 Unilateral primary osteoarthritis, left knee: Secondary | ICD-10-CM | POA: Diagnosis present

## 2014-11-24 DIAGNOSIS — E039 Hypothyroidism, unspecified: Secondary | ICD-10-CM | POA: Diagnosis present

## 2014-11-24 DIAGNOSIS — I255 Ischemic cardiomyopathy: Secondary | ICD-10-CM | POA: Diagnosis present

## 2014-11-24 DIAGNOSIS — I10 Essential (primary) hypertension: Secondary | ICD-10-CM | POA: Diagnosis present

## 2014-11-24 DIAGNOSIS — M179 Osteoarthritis of knee, unspecified: Secondary | ICD-10-CM | POA: Diagnosis present

## 2014-11-24 HISTORY — PX: TOTAL KNEE ARTHROPLASTY: SHX125

## 2014-11-24 SURGERY — ARTHROPLASTY, KNEE, TOTAL
Anesthesia: Spinal | Laterality: Left

## 2014-11-24 MED ORDER — BUPIVACAINE LIPOSOME 1.3 % IJ SUSP
20.0000 mL | INTRAMUSCULAR | Status: DC
  Filled 2014-11-24: qty 20

## 2014-11-24 MED ORDER — METOPROLOL SUCCINATE ER 25 MG PO TB24
25.0000 mg | ORAL_TABLET | Freq: Every day | ORAL | Status: DC
  Administered 2014-11-24: 25 mg via ORAL
  Filled 2014-11-24: qty 1

## 2014-11-24 MED ORDER — HYDROCODONE-ACETAMINOPHEN 5-325 MG PO TABS
1.0000 | ORAL_TABLET | ORAL | Status: DC | PRN
  Administered 2014-11-25 (×2): 2 via ORAL
  Administered 2014-11-25: 1 via ORAL
  Administered 2014-11-25 – 2014-11-26 (×3): 2 via ORAL
  Filled 2014-11-24 (×4): qty 2

## 2014-11-24 MED ORDER — LIDOCAINE HCL (CARDIAC) 20 MG/ML IV SOLN
INTRAVENOUS | Status: AC
  Filled 2014-11-24: qty 5

## 2014-11-24 MED ORDER — DIPHENHYDRAMINE HCL 50 MG/ML IJ SOLN
INTRAMUSCULAR | Status: DC | PRN
  Administered 2014-11-24: 10 mg via INTRAVENOUS

## 2014-11-24 MED ORDER — BUPIVACAINE LIPOSOME 1.3 % IJ SUSP
INTRAMUSCULAR | Status: DC | PRN
  Administered 2014-11-24: 20 mL

## 2014-11-24 MED ORDER — ONDANSETRON HCL 4 MG/2ML IJ SOLN
INTRAMUSCULAR | Status: DC | PRN
  Administered 2014-11-24: 4 mg via INTRAVENOUS

## 2014-11-24 MED ORDER — GLYCOPYRROLATE 0.2 MG/ML IJ SOLN
INTRAMUSCULAR | Status: DC | PRN
  Administered 2014-11-24: 0.2 mg via INTRAVENOUS

## 2014-11-24 MED ORDER — METOCLOPRAMIDE HCL 5 MG/ML IJ SOLN: 5.0000 mg | Freq: Three times a day (TID) | INTRAMUSCULAR | Status: DC | PRN

## 2014-11-24 MED ORDER — ONDANSETRON HCL 4 MG/2ML IJ SOLN: 4.0000 mg | Freq: Four times a day (QID) | INTRAMUSCULAR | Status: DC | PRN

## 2014-11-24 MED ORDER — MOMETASONE FURO-FORMOTEROL FUM 200-5 MCG/ACT IN AERO
2.0000 | INHALATION_SPRAY | Freq: Two times a day (BID) | RESPIRATORY_TRACT | Status: DC
  Administered 2014-11-25 – 2014-11-26 (×4): 2 via RESPIRATORY_TRACT
  Filled 2014-11-24: qty 8.8

## 2014-11-24 MED ORDER — LOSARTAN POTASSIUM 25 MG PO TABS
25.0000 mg | ORAL_TABLET | Freq: Every day | ORAL | Status: DC
  Administered 2014-11-24 – 2014-11-26 (×3): 25 mg via ORAL
  Filled 2014-11-24 (×3): qty 1

## 2014-11-24 MED ORDER — SODIUM CHLORIDE 0.9 % IR SOLN
Status: DC | PRN
  Administered 2014-11-24: 3000 mL

## 2014-11-24 MED ORDER — METHOCARBAMOL 500 MG PO TABS
500.0000 mg | ORAL_TABLET | Freq: Four times a day (QID) | ORAL | Status: DC | PRN
  Administered 2014-11-25 – 2014-11-26 (×5): 500 mg via ORAL
  Filled 2014-11-24 (×4): qty 1

## 2014-11-24 MED ORDER — METOCLOPRAMIDE HCL 10 MG PO TABS: 5.0000 mg | ORAL_TABLET | Freq: Three times a day (TID) | ORAL | Status: DC | PRN

## 2014-11-24 MED ORDER — ROPIVACAINE HCL 5 MG/ML IJ SOLN
INTRAMUSCULAR | Status: DC | PRN
  Administered 2014-11-24: 30 mL via PERINEURAL

## 2014-11-24 MED ORDER — TRANEXAMIC ACID 100 MG/ML IV SOLN
2000.0000 mg | INTRAVENOUS | Status: DC | PRN
  Administered 2014-11-24: 2000 mg via INTRAVENOUS

## 2014-11-24 MED ORDER — PROPOFOL 10 MG/ML IV BOLUS
INTRAVENOUS | Status: DC | PRN
  Administered 2014-11-24: 150 mg via INTRAVENOUS

## 2014-11-24 MED ORDER — TRANEXAMIC ACID 100 MG/ML IV SOLN
2000.0000 mg | INTRAVENOUS | Status: DC
  Filled 2014-11-24: qty 20

## 2014-11-24 MED ORDER — FENTANYL CITRATE 0.05 MG/ML IJ SOLN
INTRAMUSCULAR | Status: AC
  Filled 2014-11-24: qty 2

## 2014-11-24 MED ORDER — ASPIRIN EC 325 MG PO TBEC
325.0000 mg | DELAYED_RELEASE_TABLET | Freq: Two times a day (BID) | ORAL | Status: DC
  Administered 2014-11-25 – 2014-11-26 (×3): 325 mg via ORAL
  Filled 2014-11-24 (×5): qty 1

## 2014-11-24 MED ORDER — DEXAMETHASONE SODIUM PHOSPHATE 4 MG/ML IJ SOLN
INTRAMUSCULAR | Status: DC | PRN
  Administered 2014-11-24: 4 mg via INTRAVENOUS

## 2014-11-24 MED ORDER — SODIUM CHLORIDE 0.9 % IJ SOLN
INTRAMUSCULAR | Status: DC | PRN
  Administered 2014-11-24: 20 mL via INTRAVENOUS

## 2014-11-24 MED ORDER — MIDAZOLAM HCL 2 MG/2ML IJ SOLN
1.0000 mg | INTRAMUSCULAR | Status: DC | PRN
  Administered 2014-11-24: 2 mg via INTRAVENOUS

## 2014-11-24 MED ORDER — MEPERIDINE HCL 25 MG/ML IJ SOLN: 6.2500 mg | INTRAMUSCULAR | Status: DC | PRN

## 2014-11-24 MED ORDER — DIPHENHYDRAMINE HCL 12.5 MG/5ML PO ELIX: 12.5000 mg | ORAL_SOLUTION | ORAL | Status: DC | PRN

## 2014-11-24 MED ORDER — FENTANYL CITRATE 0.05 MG/ML IJ SOLN
50.0000 ug | INTRAMUSCULAR | Status: DC | PRN
  Administered 2014-11-24: 100 ug via INTRAVENOUS

## 2014-11-24 MED ORDER — BISACODYL 5 MG PO TBEC: 5.0000 mg | DELAYED_RELEASE_TABLET | Freq: Every day | ORAL | Status: DC | PRN

## 2014-11-24 MED ORDER — HYDROMORPHONE HCL 1 MG/ML IJ SOLN
0.5000 mg | INTRAMUSCULAR | Status: DC | PRN
  Administered 2014-11-25 (×2): 1 mg via INTRAVENOUS
  Filled 2014-11-24: qty 1

## 2014-11-24 MED ORDER — ELTROMBOPAG OLAMINE 25 MG PO TABS: 25.0000 mg | ORAL_TABLET | ORAL | Status: DC

## 2014-11-24 MED ORDER — MIDAZOLAM HCL 2 MG/2ML IJ SOLN: 0.5000 mg | Freq: Once | INTRAMUSCULAR | Status: DC | PRN

## 2014-11-24 MED ORDER — OXYCODONE HCL 5 MG/5ML PO SOLN: 5.0000 mg | Freq: Once | ORAL | Status: DC | PRN

## 2014-11-24 MED ORDER — LEVOTHYROXINE SODIUM 175 MCG PO TABS
175.0000 ug | ORAL_TABLET | Freq: Every day | ORAL | Status: DC
  Administered 2014-11-25 – 2014-11-26 (×2): 175 ug via ORAL
  Filled 2014-11-24 (×3): qty 1

## 2014-11-24 MED ORDER — PHENOL 1.4 % MT LIQD: 1.0000 | OROMUCOSAL | Status: DC | PRN

## 2014-11-24 MED ORDER — MIDAZOLAM HCL 2 MG/2ML IJ SOLN
INTRAMUSCULAR | Status: AC
  Filled 2014-11-24: qty 2

## 2014-11-24 MED ORDER — LACTATED RINGERS IV SOLN
INTRAVENOUS | Status: DC
  Administered 2014-11-25: 01:00:00 via INTRAVENOUS

## 2014-11-24 MED ORDER — FLUOXETINE HCL 10 MG PO CAPS
10.0000 mg | ORAL_CAPSULE | Freq: Every day | ORAL | Status: DC
  Administered 2014-11-24 – 2014-11-25 (×2): 10 mg via ORAL
  Filled 2014-11-24 (×3): qty 1

## 2014-11-24 MED ORDER — LIDOCAINE HCL (CARDIAC) 20 MG/ML IV SOLN
INTRAVENOUS | Status: DC | PRN
  Administered 2014-11-24: 30 mg via INTRAVENOUS

## 2014-11-24 MED ORDER — MENTHOL 3 MG MT LOZG: 1.0000 | LOZENGE | OROMUCOSAL | Status: DC | PRN

## 2014-11-24 MED ORDER — CEFAZOLIN SODIUM-DEXTROSE 2-3 GM-% IV SOLR
2.0000 g | Freq: Four times a day (QID) | INTRAVENOUS | Status: AC
  Administered 2014-11-24 – 2014-11-25 (×2): 2 g via INTRAVENOUS
  Filled 2014-11-24 (×2): qty 50

## 2014-11-24 MED ORDER — ACETAMINOPHEN 650 MG RE SUPP: 650.0000 mg | Freq: Four times a day (QID) | RECTAL | Status: DC | PRN

## 2014-11-24 MED ORDER — FENTANYL CITRATE 0.05 MG/ML IJ SOLN
INTRAMUSCULAR | Status: AC
  Filled 2014-11-24: qty 5

## 2014-11-24 MED ORDER — OXYCODONE HCL 5 MG PO TABS: 5.0000 mg | ORAL_TABLET | Freq: Once | ORAL | Status: DC | PRN

## 2014-11-24 MED ORDER — GABAPENTIN 100 MG PO CAPS
100.0000 mg | ORAL_CAPSULE | Freq: Three times a day (TID) | ORAL | Status: DC
  Administered 2014-11-24 – 2014-11-26 (×5): 100 mg via ORAL
  Filled 2014-11-24 (×7): qty 1

## 2014-11-24 MED ORDER — ACETAMINOPHEN 325 MG PO TABS: 650.0000 mg | ORAL_TABLET | Freq: Four times a day (QID) | ORAL | Status: DC | PRN

## 2014-11-24 MED ORDER — LACTATED RINGERS IV SOLN
INTRAVENOUS | Status: DC
  Administered 2014-11-24 (×2): via INTRAVENOUS

## 2014-11-24 MED ORDER — ALUM & MAG HYDROXIDE-SIMETH 200-200-20 MG/5ML PO SUSP: 30.0000 mL | ORAL | Status: DC | PRN

## 2014-11-24 MED ORDER — PHENYLEPHRINE HCL 10 MG/ML IJ SOLN
INTRAMUSCULAR | Status: AC
  Filled 2014-11-24: qty 1

## 2014-11-24 MED ORDER — HYDROMORPHONE HCL 1 MG/ML IJ SOLN: 0.2500 mg | INTRAMUSCULAR | Status: DC | PRN

## 2014-11-24 MED ORDER — PROMETHAZINE HCL 25 MG/ML IJ SOLN: 6.2500 mg | INTRAMUSCULAR | Status: DC | PRN

## 2014-11-24 MED ORDER — FENTANYL CITRATE 0.05 MG/ML IJ SOLN
INTRAMUSCULAR | Status: DC | PRN
  Administered 2014-11-24 (×8): 25 ug via INTRAVENOUS

## 2014-11-24 MED ORDER — DIPHENHYDRAMINE HCL 25 MG PO TABS
25.0000 mg | ORAL_TABLET | Freq: Four times a day (QID) | ORAL | Status: DC | PRN
  Filled 2014-11-24: qty 1

## 2014-11-24 MED ORDER — ONDANSETRON HCL 4 MG PO TABS: 4.0000 mg | ORAL_TABLET | Freq: Four times a day (QID) | ORAL | Status: DC | PRN

## 2014-11-24 MED ORDER — PHENYLEPHRINE HCL 10 MG/ML IJ SOLN
10.0000 mg | INTRAVENOUS | Status: DC | PRN
  Administered 2014-11-24: 25 ug/min via INTRAVENOUS

## 2014-11-24 MED ORDER — PANTOPRAZOLE SODIUM 40 MG PO TBEC
80.0000 mg | DELAYED_RELEASE_TABLET | Freq: Every day | ORAL | Status: DC
  Administered 2014-11-24 – 2014-11-26 (×3): 80 mg via ORAL
  Filled 2014-11-24: qty 2

## 2014-11-24 MED ORDER — PROPOFOL 10 MG/ML IV BOLUS
INTRAVENOUS | Status: AC
  Filled 2014-11-24: qty 20

## 2014-11-24 MED ORDER — METHOCARBAMOL 1000 MG/10ML IJ SOLN
500.0000 mg | Freq: Four times a day (QID) | INTRAVENOUS | Status: DC | PRN
  Filled 2014-11-24: qty 5

## 2014-11-24 MED ORDER — DOCUSATE SODIUM 100 MG PO CAPS
100.0000 mg | ORAL_CAPSULE | Freq: Two times a day (BID) | ORAL | Status: DC
  Administered 2014-11-24 – 2014-11-26 (×4): 100 mg via ORAL
  Filled 2014-11-24 (×4): qty 1

## 2014-11-24 SURGICAL SUPPLY — 66 items
BAG DECANTER FOR FLEXI CONT (MISCELLANEOUS) ×3 IMPLANT
BANDAGE ELASTIC 4 VELCRO ST LF (GAUZE/BANDAGES/DRESSINGS) ×3 IMPLANT
BANDAGE ELASTIC 6 VELCRO ST LF (GAUZE/BANDAGES/DRESSINGS) ×3 IMPLANT
BANDAGE ESMARK 6X9 LF (GAUZE/BANDAGES/DRESSINGS) ×1 IMPLANT
BENZOIN TINCTURE PRP APPL 2/3 (GAUZE/BANDAGES/DRESSINGS) ×3 IMPLANT
BLADE SAGITTAL 25.0X1.19X90 (BLADE) IMPLANT
BLADE SAGITTAL 25.0X1.19X90MM (BLADE)
BLADE SAW SGTL 13.0X1.19X90.0M (BLADE) IMPLANT
BLADE SURG ROTATE 9660 (MISCELLANEOUS) IMPLANT
BNDG ELASTIC 6X10 VLCR STRL LF (GAUZE/BANDAGES/DRESSINGS) ×3 IMPLANT
BNDG ESMARK 6X9 LF (GAUZE/BANDAGES/DRESSINGS) ×3
BNDG GAUZE ELAST 4 BULKY (GAUZE/BANDAGES/DRESSINGS) ×3 IMPLANT
BOWL SMART MIX CTS (DISPOSABLE) ×3 IMPLANT
CAPT RP KNEE ×3 IMPLANT
CEMENT HV SMART SET (Cement) ×6 IMPLANT
CLOSURE WOUND 1/2 X4 (GAUZE/BANDAGES/DRESSINGS) ×1
COVER SURGICAL LIGHT HANDLE (MISCELLANEOUS) ×3 IMPLANT
CUFF TOURNIQUET SINGLE 34IN LL (TOURNIQUET CUFF) ×3 IMPLANT
DRAPE EXTREMITY T 121X128X90 (DRAPE) ×3 IMPLANT
DRAPE IMP U-DRAPE 54X76 (DRAPES) ×3 IMPLANT
DRAPE PROXIMA HALF (DRAPES) ×3 IMPLANT
DRAPE U-SHAPE 47X51 STRL (DRAPES) ×3 IMPLANT
DRSG ADAPTIC 3X8 NADH LF (GAUZE/BANDAGES/DRESSINGS) ×3 IMPLANT
DRSG PAD ABDOMINAL 8X10 ST (GAUZE/BANDAGES/DRESSINGS) ×3 IMPLANT
DURAPREP 26ML APPLICATOR (WOUND CARE) ×3 IMPLANT
ELECT REM PT RETURN 9FT ADLT (ELECTROSURGICAL) ×3
ELECTRODE REM PT RTRN 9FT ADLT (ELECTROSURGICAL) ×1 IMPLANT
GAUZE SPONGE 4X4 12PLY STRL (GAUZE/BANDAGES/DRESSINGS) ×3 IMPLANT
GLOVE BIO SURGEON STRL SZ8 (GLOVE) ×6 IMPLANT
GLOVE BIOGEL M 6.5 STRL (GLOVE) ×6 IMPLANT
GLOVE BIOGEL M STER SZ 6 (GLOVE) ×6 IMPLANT
GLOVE BIOGEL PI IND STRL 8 (GLOVE) ×2 IMPLANT
GLOVE BIOGEL PI INDICATOR 8 (GLOVE) ×4
GOWN STRL REUS W/ TWL LRG LVL3 (GOWN DISPOSABLE) ×2 IMPLANT
GOWN STRL REUS W/ TWL XL LVL3 (GOWN DISPOSABLE) ×2 IMPLANT
GOWN STRL REUS W/TWL LRG LVL3 (GOWN DISPOSABLE) ×4
GOWN STRL REUS W/TWL XL LVL3 (GOWN DISPOSABLE) ×4
HANDPIECE INTERPULSE COAX TIP (DISPOSABLE) ×2
HOOD PEEL AWAY FACE SHEILD DIS (HOOD) ×6 IMPLANT
IMMOBILIZER KNEE 22 UNIV (SOFTGOODS) ×3 IMPLANT
IMMOBILIZER KNEE 24 THIGH 36 (MISCELLANEOUS) IMPLANT
IMMOBILIZER KNEE 24 UNIV (MISCELLANEOUS)
KIT BASIN OR (CUSTOM PROCEDURE TRAY) ×3 IMPLANT
KIT ROOM TURNOVER OR (KITS) ×3 IMPLANT
MANIFOLD NEPTUNE II (INSTRUMENTS) ×3 IMPLANT
NEEDLE HYPO 21X1 ECLIPSE (NEEDLE) ×3 IMPLANT
NS IRRIG 1000ML POUR BTL (IV SOLUTION) ×3 IMPLANT
PACK TOTAL JOINT (CUSTOM PROCEDURE TRAY) ×3 IMPLANT
PACK UNIVERSAL I (CUSTOM PROCEDURE TRAY) ×3 IMPLANT
PAD ARMBOARD 7.5X6 YLW CONV (MISCELLANEOUS) ×6 IMPLANT
SET HNDPC FAN SPRY TIP SCT (DISPOSABLE) ×1 IMPLANT
SPONGE GAUZE 4X4 12PLY STER LF (GAUZE/BANDAGES/DRESSINGS) ×3 IMPLANT
STAPLER VISISTAT 35W (STAPLE) IMPLANT
STRIP CLOSURE SKIN 1/2X4 (GAUZE/BANDAGES/DRESSINGS) ×2 IMPLANT
SUCTION FRAZIER TIP 10 FR DISP (SUCTIONS) ×3 IMPLANT
SUT MNCRL AB 3-0 PS2 18 (SUTURE) IMPLANT
SUT VIC AB 0 CT1 27 (SUTURE) ×4
SUT VIC AB 0 CT1 27XBRD ANBCTR (SUTURE) ×2 IMPLANT
SUT VIC AB 2-0 CT1 27 (SUTURE) ×4
SUT VIC AB 2-0 CT1 TAPERPNT 27 (SUTURE) ×2 IMPLANT
SUT VLOC 180 0 24IN GS25 (SUTURE) ×3 IMPLANT
SYR 50ML LL SCALE MARK (SYRINGE) ×3 IMPLANT
TOWEL OR 17X24 6PK STRL BLUE (TOWEL DISPOSABLE) ×3 IMPLANT
TOWEL OR 17X26 10 PK STRL BLUE (TOWEL DISPOSABLE) ×3 IMPLANT
UPCHARGE REV TRAY MBT KNEE ×3 IMPLANT
WATER STERILE IRR 1000ML POUR (IV SOLUTION) IMPLANT

## 2014-11-24 NOTE — Anesthesia Preprocedure Evaluation (Addendum)
Anesthesia Evaluation  Patient identified by MRN, date of birth, ID band Patient awake    Reviewed: Allergy & Precautions, H&P , NPO status , Patient's Chart, lab work & pertinent test results, reviewed documented beta blocker date and time   History of Anesthesia Complications Negative for: history of anesthetic complications  Airway Mallampati: II  TM Distance: >3 FB Neck ROM: Full    Dental  (+) Missing, Dental Advisory Given, Chipped   Pulmonary COPDformer smoker,  breath sounds clear to auscultation        Cardiovascular hypertension, Pt. on medications and Pt. on home beta blockers + CAD, + Past MI, + Cardiac Stents and + CABG (2011) + Valvular Problems/Murmurs (mild) AS Rhythm:Regular Rate:Normal  '14 Cath: CAD, with some SVG occlusion, some collateral flow, medical management recommended    Neuro/Psych Anxiety negative neurological ROS     GI/Hepatic Neg liver ROS, GERD-  Medicated and Controlled,  Endo/Other  Hypothyroidism   Renal/GU negative Renal ROS     Musculoskeletal  (+) Arthritis -,   Abdominal (+) + obese,   Peds  Hematology  (+) Blood dyscrasia (h/o ITP), ,   Anesthesia Other Findings   Reproductive/Obstetrics                            Anesthesia Physical Anesthesia Plan  ASA: III  Anesthesia Plan: General   Post-op Pain Management:    Induction: Intravenous  Airway Management Planned: LMA  Additional Equipment:   Intra-op Plan:   Post-operative Plan:   Informed Consent: I have reviewed the patients History and Physical, chart, labs and discussed the procedure including the risks, benefits and alternatives for the proposed anesthesia with the patient or authorized representative who has indicated his/her understanding and acceptance.   Dental advisory given  Plan Discussed with: CRNA and Surgeon  Anesthesia Plan Comments: (Plan routine monitors, GA- LMA  OK with adductor canal block for post op analgesia Pt declines spinal)        Anesthesia Quick Evaluation

## 2014-11-24 NOTE — Interval H&P Note (Signed)
agree

## 2014-11-24 NOTE — Progress Notes (Signed)
Pt reports that due to itching, stopped taking Toprol-XL on 11/17/14 after taking for about one year. Pt reports slight decrease in itching. Pt reports that he did not speak with physician prior to stopping beta blocker. Will inform Dr. Glennon Mac.

## 2014-11-24 NOTE — Progress Notes (Signed)
Orthopedic Tech Progress Note Patient Details:  Jeffrey [name] 26-Apr-1934 [id number]  CPM Left Knee CPM Left Knee: On Left Knee Flexion (Degrees): 90 Left Knee Extension (Degrees): 0 Additional Comments: applied ohf to bed   Braulio Bosch 11/24/2014, 6:13 PM

## 2014-11-24 NOTE — Anesthesia Postprocedure Evaluation (Signed)
  Anesthesia Post-op Note  Patient: Jeffrey [name]  Procedure(s) Performed: Procedure(s): TOTAL KNEE ARTHROPLASTY (Left)  Patient Location: PACU  Anesthesia Type:GA combined with regional for post-op pain  Level of Consciousness: awake, alert , oriented and patient cooperative  Airway and Oxygen Therapy: Patient Spontanous Breathing and Patient connected to nasal cannula oxygen  Post-op Pain: none  Post-op Assessment: Post-op Vital signs reviewed, Patient's Cardiovascular Status Stable, Respiratory Function Stable, Patent Airway, No signs of Nausea or vomiting and Pain level controlled  Post-op Vital Signs: Reviewed and stable  Last Vitals:  Filed Vitals:   11/24/14 1730  BP: 138/72  Pulse: 64  Temp: 36.5 C  Resp: 12    Complications: No apparent anesthesia complications

## 2014-11-24 NOTE — Op Note (Signed)
PREOP DIAGNOSIS: DJD LEFT KNEE POSTOP DIAGNOSIS:  same PROCEDURE: LEFT TKR ANESTHESIA: General and block ATTENDING SURGEON: Fidelis Loth G ASSISTANT: Loni Dolly PA  INDICATIONS FOR PROCEDURE: Jeffrey [name] is a 80 y.o. male who has struggled for a long time with pain due to degenerative arthritis of the left knee.  The patient has failed many conservative non-operative measures and at this point has pain which limits the ability to sleep and walk.  The patient is offered total knee replacement. He also has been treated for a possible infection of this knee with arthroscopic followed by open I&D and then a long course of IV and PO antibiotics.  Informed operative consent was obtained after discussion of possible risks of anesthesia, infection, neurovascular injury, DVT, and death.  The importance of the post-operative rehabilitation protocol to optimize result was stressed extensively with the patient.  SUMMARY OF FINDINGS AND PROCEDURE:  Jeffrey [name] was taken to the operative suite where under the above anesthesia a left knee replacement was performed.  There were advanced degenerative changes and the bone quality was excellent.  We used the DePuyLCS system and placed size large femur, 5 tibia, 41 mm all polyethylene patella, and a size 10 mm spacer.  Loni Dolly PA-C assisted throughout and was invaluable to the completion of the case in that he helped retract and maintain exposure while I placed the components.  He also helped close thereby minimizing OR time.  The patient was admitted for appropriate post-op care to include perioperative antibiotics and mechanical and pharmacologic measures for DVT prophylaxis.  DESCRIPTION OF PROCEDURE:  Jeffrey [name] was taken to the operative suite where the above anesthesia was applied.  The patient was positioned supine and prepped and draped in normal sterile fashion.  An appropriate time out was performed.  After the administration of kefzol pre-op  antibiotic the leg was elevated and exsanguinated and a tourniquet inflated.  A standard longitudinal incision was made on the anterior knee.  Dissection was carried down to the extensor mechanism.  All appropriate anti-infective measures were used including the pre-operative antibiotic, betadine impregnated drape, and closed hooded exhaust systems for each member of the surgical team.  A medial parapatellar incision was made in the extensor mechanism and the knee cap flipped and the knee flexed.  Some residual meniscal tissues were removed along with any remaining ACL/PCL tissue.  A guide was placed on the tibia and a flat cut was made on it's superior surface.  An intramedullary guide was placed in the femur and was utilized to make anterior and posterior cuts creating an appropriate flexion gap.  A second intramedullary guide was placed in the femur to make a distal cut properly balancing the knee with an extension gap equal to the flexion gap.  The three bones sized to the above mentioned sizes and the appropriate guides were placed and utilized.  A trial reduction was done and the knee easily came to full extension and the patella tracked well on flexion.  The trial components were removed and all bones were cleaned with pulsatile lavage and then dried thoroughly.  Cement was mixed and was pressurized onto the bones followed by placement of the aforementioned components.  Excess cement was trimmed and pressure was held on the components until the cement had hardened.  The tourniquet was deflated and a small amount of bleeding was controlled with cautery and pressure.  The knee was irrigated thoroughly.  The extensor mechanism was re-approximated with V-loc suture  in running fashion.  The knee was flexed and the repair was solid.  The subcutaneous tissues were re-approximated with #0 and #2-0 vicryl and the skin closed with a subcuticular stitch and steristrips.  A sterile dressing was applied.  Intraoperative  fluids, EBL, and tourniquet time can be obtained from anesthesia records.  DISPOSITION:  The patient was taken to recovery room in stable condition and admitted for appropriate post-op care to include peri-operative antibiotic and DVT prophylaxis with mechanical and pharmacologic measures.  Valkyrie Guardiola G 11/24/2014, 4:25 PM

## 2014-11-24 NOTE — Anesthesia Procedure Notes (Addendum)
Anesthesia Regional Block:  Adductor canal block  Pre-Anesthetic Checklist: ,, timeout performed, Correct Patient, Correct Site, Correct Laterality, Correct Procedure, Correct Position, site marked, Risks and benefits discussed,  Surgical consent,  Pre-op evaluation,  At surgeon's request and post-op pain management  Laterality: Left and Lower  Prep: chloraprep       Needles:  Injection technique: Single-shot  Needle Type: Echogenic Stimulator Needle     Needle Length: 9cm 9 cm Needle Gauge: 22 and 22 G    Additional Needles:  Procedures: ultrasound guided (picture in chart) and nerve stimulator Adductor canal block  Nerve Stimulator or Paresthesia:  Response: quad twitch,   Additional Responses:   Narrative:  Start time: 11/24/2014 1:57 PM End time: 11/24/2014 2:03 PM Injection made incrementally with aspirations every 5 mL.  Performed by: Personally  Anesthesiologist: Seleta Rhymes CARSWELL  Additional Notes: Pt identified in Holding room.  Monitors applied. Working IV access confirmed. Sterile prep, drape L thigh.  #22ga Echogenic PNS into adductor canal with US guidance, + quad twitch.  30cc 0.5% Ropivacaine injected incrementally after negative test dose, good spread around nerve.  Patient asymptomatic, VSS, no heme aspirated, tolerated well.  Jenita Seashore, MD   Procedure Name: LMA Insertion Date/Time: 11/24/2014 2:41 PM Performed by: [place] Pink Pre-anesthesia Checklist: Patient identified, Emergency Drugs available, Suction available, Patient being monitored and Timeout performed Patient Re-evaluated:Patient Re-evaluated prior to inductionOxygen Delivery Method: Circle system utilized Preoxygenation: Pre-oxygenation with 100% oxygen Intubation Type: IV induction LMA: LMA inserted LMA Size: 5.0 Number of attempts: 1 Placement Confirmation: positive ETCO2 and breath sounds checked- equal and bilateral Tube secured with: Tape Dental Injury: Teeth and Oropharynx as  per pre-operative assessment

## 2014-11-24 NOTE — Transfer of Care (Signed)
Immediate Anesthesia Transfer of Care Note  Patient: Jeffrey [name]  Procedure(s) Performed: Procedure(s): TOTAL KNEE ARTHROPLASTY (Left)  Patient Location: PACU  Anesthesia Type:GA combined with regional for post-op pain  Level of Consciousness: awake and alert   Airway & Oxygen Therapy: Patient Spontanous Breathing and Patient connected to nasal cannula oxygen  Post-op Assessment: Report given to PACU RN and Post -op Vital signs reviewed and stable  Post vital signs: Reviewed and stable  Complications: No apparent anesthesia complications

## 2014-11-24 NOTE — Progress Notes (Signed)
Spoke with Dr. Glennon Mac regarding Toprol-XL, ordered to give dose today.

## 2014-11-24 NOTE — Progress Notes (Signed)
CRNA at bedside.

## 2014-11-25 LAB — BASIC METABOLIC PANEL
Anion gap: 13 (ref 5–15)
BUN: 17 mg/dL (ref 6–23)
CO2: 25 meq/L (ref 19–32)
CREATININE: 1.14 mg/dL (ref 0.50–1.35)
Calcium: 9.1 mg/dL (ref 8.4–10.5)
Chloride: 100 mEq/L (ref 96–112)
GFR calc Af Amer: 69 mL/min — ABNORMAL LOW (ref >90)
GFR, EST NON AFRICAN AMERICAN: 60 mL/min — AB (ref >90)
Glucose, Bld: 125 mg/dL — ABNORMAL HIGH (ref 70–99)
Potassium: 4.5 mEq/L (ref 3.7–5.3)
Sodium: 138 mEq/L (ref 137–147)

## 2014-11-25 LAB — CBC
HEMATOCRIT: 34.6 % — AB (ref 39.0–52.0)
HEMOGLOBIN: 11.3 g/dL — AB (ref 13.0–17.0)
MCH: 28.5 pg (ref 26.0–34.0)
MCHC: 32.7 g/dL (ref 30.0–36.0)
MCV: 87.4 fL (ref 78.0–100.0)
Platelets: 243 10*3/uL (ref 150–400)
RBC: 3.96 MIL/uL — AB (ref 4.22–5.81)
RDW: 14.3 % (ref 11.5–15.5)
WBC: 13.3 10*3/uL — ABNORMAL HIGH (ref 4.0–10.5)

## 2014-11-25 MED ORDER — HYDROMORPHONE HCL 1 MG/ML IJ SOLN
INTRAMUSCULAR | Status: AC
  Filled 2014-11-25: qty 1

## 2014-11-25 NOTE — Progress Notes (Signed)
Subjective: 1 Day Post-Op Procedure(s) (LRB): TOTAL KNEE ARTHROPLASTY (Left)  Activity level:  wbat Diet tolerance:  Eating well Voiding:  ok Patient reports pain as mild.    Objective: Vital signs in last 24 hours: Temp:  [97.4 F (36.3 C)-98.2 F (36.8 C)] 98.2 F (36.8 C) (12/05 0505) Pulse Rate:  [57-73] 64 (12/05 0505) Resp:  [8-20] 16 (12/05 0505) BP: (110-173)/(60-86) 110/60 mmHg (12/05 0505) SpO2:  [90 %-100 %] 96 % (12/05 0933) Weight:  [108.41 kg (239 lb)] 108.41 kg (239 lb) (12/04 1053)  Labs:  Recent Labs  11/25/14 0723  HGB 11.3*    Recent Labs  11/25/14 0723  WBC 13.3*  RBC 3.96*  HCT 34.6*  PLT 243    Recent Labs  11/25/14 0723  NA 138  K 4.5  CL 100  CO2 25  BUN 17  CREATININE 1.14  GLUCOSE 125*  CALCIUM 9.1   No results for input(s): LABPT, INR in the last 72 hours.  Physical Exam:  Neurologically intact ABD soft Neurovascular intact Sensation intact distally Intact pulses distally Dorsiflexion/Plantar flexion intact Incision: dressing C/D/I No cellulitis present Compartment soft  Assessment/Plan:  1 Day Post-Op Procedure(s) (LRB): TOTAL KNEE ARTHROPLASTY (Left) Advance diet Up with therapy D/C IV fluids Plan for discharge tomorrow Discharge home with home health if continuing to do well and cleared by PT. Continue on ASA 325mg  BID x 2 weeks post op Follow up in office 2 weeks.  Will change dressing tomorrow prior to discharge.    Jeffrey [name], Larwance Sachs 11/25/2014, 10:36 AM

## 2014-11-25 NOTE — Plan of Care (Signed)
Problem: Consults Goal: Total Joint Replacement Patient Education See Patient Education Module for education specifics. Outcome: Completed/Met Date Met:  11/25/14 Goal: Diagnosis- Total Joint Replacement Primary Total Knee Goal: Skin Care Protocol Initiated - if Braden Score 18 or less If consults are not indicated, leave blank or document N/A Outcome: Completed/Met Date Met:  11/25/14 Goal: Nutrition Consult-if indicated Outcome: Not Applicable Date Met:  25/85/27 Goal: Diabetes Guidelines if Diabetic/Glucose > 140 If diabetic or lab glucose is > 140 mg/dl - Initiate Diabetes/Hyperglycemia Guidelines & Document Interventions  Outcome: Not Applicable Date Met:  78/24/23  Problem: Phase I Progression Outcomes Goal: Pain controlled with appropriate interventions Outcome: Completed/Met Date Met:  11/25/14 Goal: Dangle or out of bed evening of surgery Outcome: Completed/Met Date Met:  11/25/14 Goal: Initial discharge plan identified Outcome: Completed/Met Date Met:  11/25/14

## 2014-11-25 NOTE — Care Management Note (Signed)
Utilization review completed. Aiden Helzer, RN BSN Case Manager 

## 2014-11-25 NOTE — Evaluation (Signed)
Physical Therapy Evaluation Patient Details Name: Jeffrey [name] MRN: [id number] DOB: 02-01-1934 Today's Date: 11/25/2014   History of Present Illness  pt is a 80 y.o. male s/p Lt TKA on 11/24/14.  Clinical Impression  Pt is s/p Lt TKA POD#1 resulting in the deficits listed below (see PT Problem List). Pt will benefit from skilled PT to increase their independence and safety with mobility to allow discharge to the venue listed below.      Follow Up Recommendations Home health PT;Supervision/Assistance - 24 hour    Equipment Recommendations  None recommended by PT    Recommendations for Other Services OT consult     Precautions / Restrictions Precautions Precautions: Knee Precaution Comments: reviewed no knee under pillow  Required Braces or Orthoses: Knee Immobilizer - Left Restrictions Weight Bearing Restrictions: Yes LLE Weight Bearing: Weight bearing as tolerated      Mobility  Bed Mobility Overal bed mobility: Needs Assistance Bed Mobility: Supine to Sit     Supine to sit: Supervision;HOB elevated     General bed mobility comments: incr time and cues for technique; use of handrails  Transfers Overall transfer level: Needs assistance Equipment used: Rolling walker (2 wheeled) Transfers: Sit to/from Stand Sit to Stand: Supervision         General transfer comment: cues for hand placement  Ambulation/Gait Ambulation/Gait assistance: Supervision Ambulation Distance (Feet): 30 Feet Assistive device: Rolling walker (2 wheeled) Gait Pattern/deviations: Step-to pattern;Decreased stance time - left;Decreased step length - right;Antalgic;Trunk flexed;Narrow base of support Gait velocity: decreased Gait velocity interpretation: Below normal speed for age/gender General Gait Details: cues for step through gt sequencing and safety with RW  Stairs            Wheelchair Mobility    Modified Rankin (Stroke Patients Only)       Balance Overall balance  assessment: Needs assistance Sitting-balance support: Feet supported;No upper extremity supported Sitting balance-Leahy Scale: Good     Standing balance support: During functional activity;Bilateral upper extremity supported Standing balance-Leahy Scale: Poor Standing balance comment: RW to balance                             Pertinent Vitals/Pain Pain Assessment: 0-10 Pain Score: 6  Pain Location: Lt knee  Pain Descriptors / Indicators: Aching Pain Intervention(s): Premedicated before session;Monitored during session;Repositioned    Home Living Family/patient expects to be discharged to:: Private residence Living Arrangements: Spouse/significant other Available Help at Discharge: Family;Available 24 hours/day Type of Home: House Home Access: Stairs to enter Entrance Stairs-Rails: Left Entrance Stairs-Number of Steps: 4 Home Layout: One level Home Equipment: Walker - 2 wheels;Shower seat;Bedside commode      Prior Function Level of Independence: Independent with assistive device(s)         Comments: pt ambulating with RW since previous knee surgery     Hand Dominance        Extremity/Trunk Assessment   Upper Extremity Assessment: Defer to OT evaluation           Lower Extremity Assessment: LLE deficits/detail   LLE Deficits / Details: AROM -5 to 60 degrees limited by ACE wrap  Cervical / Trunk Assessment: Normal  Communication   Communication: No difficulties  Cognition Arousal/Alertness: Awake/alert Behavior During Therapy: WFL for tasks assessed/performed Overall Cognitive Status: Within Functional Limits for tasks assessed  General Comments      Exercises Total Joint Exercises Ankle Circles/Pumps: AROM;Both;10 reps;Seated Quad Sets: AROM;Left;10 reps Hip ABduction/ADduction: AROM;Strengthening;Left;10 reps;Seated Long Arc Quad: AAROM;Strengthening;Left;5 reps;Seated      Assessment/Plan    PT  Assessment Patient needs continued PT services  PT Diagnosis Difficulty walking;Generalized weakness;Acute pain   PT Problem List Decreased strength;Decreased range of motion;Decreased activity tolerance;Decreased balance;Decreased mobility;Decreased knowledge of use of DME;Pain  PT Treatment Interventions DME instruction;Stair training;Gait training;Functional mobility training;Therapeutic activities;Therapeutic exercise;Neuromuscular re-education;Balance training;Patient/family education   PT Goals (Current goals can be found in the Care Plan section) Acute Rehab PT Goals Patient Stated Goal: home soon PT Goal Formulation: With patient Time For Goal Achievement: 11/29/14 Potential to Achieve Goals: Good    Frequency 7X/week   Barriers to discharge        Co-evaluation               End of Session Equipment Utilized During Treatment: Gait belt;Left knee immobilizer Activity Tolerance: Patient tolerated treatment well Patient left: in chair;with call bell/phone within reach;with family/visitor present Nurse Communication: Mobility status;Precautions;Weight bearing status         Time: 6222-9798 PT Time Calculation (min) (ACUTE ONLY): 20 min   Charges:   PT Evaluation $Initial PT Evaluation Tier I: 1 Procedure PT Treatments $Gait Training: 8-22 mins   PT G CodesGustavus Bryant , [place]  [phone]  11/25/2014, 9:15 AM

## 2014-11-25 NOTE — Evaluation (Signed)
Occupational Therapy Evaluation Patient Details Name: Jeffrey [name] MRN: [id number] DOB: Jul 14, 1934 Today's Date: 11/25/2014    History of Present Illness pt is a 80 y.o. male s/p Lt TKA on 11/24/14.   Clinical Impression   Pt s/p above. Feel pt will benefit from acute OT to increase independence with BADLs, PTA. Plan to practice LB ADLs and tub transfer next session.    Follow Up Recommendations  No OT follow up;Supervision - Intermittent    Equipment Recommendations  Other (comment) (AE)    Recommendations for Other Services       Precautions / Restrictions Precautions Precautions: Knee Precaution Comments: educated on precautions Required Braces or Orthoses: Knee Immobilizer - Left Restrictions Weight Bearing Restrictions: Yes LLE Weight Bearing: Weight bearing as tolerated      Mobility Bed Mobility Overal bed mobility: Needs Assistance Bed Mobility: Supine to Sit;Sit to Supine     Supine to sit: Supervision Sit to supine: Supervision   General bed mobility comments:used rail  Transfers Overall transfer level: Needs assistance Equipment used: Rolling walker (2 wheeled) Transfers: Sit to/from Stand Sit to Stand: Supervision         General transfer comment: cues for hand placement         ADL Overall ADL's : Needs assistance/impaired                     Lower Body Dressing: Minimal assistance;With adaptive equipment;Sit to/from stand   Toilet Transfer: Ambulation;RW;Supervision/safety (bed)           Functional mobility during ADLs: Supervision/safety;Rolling walker General ADL Comments: Educated on AE /where they can purchase and cost. Educated on safety tips (use of bag on walker, rugs, recommended spouse be with him for tub transfer). Educated on tub transfer techniques and options for shower chair-recommended no stepping over anytime soon. Reviewed LB dressing technique (pt able to verbalize). Explained benefit of reaching down to  Left foot for ADLs as it allows knee to bend. Discussed knee immobilizer and purpose. Pt practiced with sockaid.     Vision                     Perception     Praxis      Pertinent Vitals/Pain Pain Assessment: 0-10 Pain Score: 5  Pain Location: left knee Pain Intervention(s): Monitored during session     Hand Dominance     Extremity/Trunk Assessment Upper Extremity Assessment Upper Extremity Assessment: Overall WFL for tasks assessed   Lower Extremity Assessment Lower Extremity Assessment: Defer to PT evaluation       Communication Communication Communication: No difficulties   Cognition Arousal/Alertness: Awake/alert Behavior During Therapy: WFL for tasks assessed/performed Overall Cognitive Status: Within Functional Limits for tasks assessed                     General Comments          Shoulder Instructions      Home Living Family/patient expects to be discharged to:: Private residence Living Arrangements: Spouse/significant other Available Help at Discharge: Family;Available 24 hours/day Type of Home: House Home Access: Stairs to enter [employer] of Steps: 4 Entrance Stairs-Rails: Left Home Layout: One level     Bathroom Shower/Tub: Teacher, early years/pre: Handicapped height     Home Equipment: Environmental consultant - 2 wheels;Shower seat;Bedside commode          Prior Functioning/Environment Level of Independence: Needs assistance    ADL's / Homemaking  Assistance Needed: assist with LB dressing   Comments: pt ambulating with RW since previous knee surgery    OT Diagnosis: Acute pain   OT Problem List: Decreased strength;Decreased range of motion;Decreased activity tolerance;Decreased knowledge of use of DME or AE;Decreased knowledge of precautions;Pain   OT Treatment/Interventions: Self-care/ADL training;DME and/or AE instruction;Therapeutic activities;Patient/family education;Balance training    OT  Goals(Current goals can be found in the care plan section) Acute Rehab OT Goals Patient Stated Goal: not stated OT Goal Formulation: With patient Time For Goal Achievement: 12/02/14 Potential to Achieve Goals: Good ADL Goals Pt Will Perform Lower Body Dressing: sit to/from stand;with adaptive equipment;with set-up;with supervision Pt Will Transfer to Toilet: with modified independence;ambulating Pt Will Perform Tub/Shower Transfer: Tub transfer;with supervision;ambulating;shower seat;rolling walker  OT Frequency: Min 2X/week   Barriers to D/C:            Co-evaluation              End of Session Equipment Utilized During Treatment: Gait belt;Rolling walker;Left knee immobilizer CPM Left Knee CPM Left Knee: Off Nurse Communication: Mobility status  Activity Tolerance: Patient tolerated treatment well Patient left: in bed;with call bell/phone within reach;with family/visitor present   Time: 1700-1730 OT Time Calculation (min): 30 min Charges:  OT General Charges $OT Visit: 1 Procedure OT Evaluation $Initial OT Evaluation Tier I: 1 Procedure OT Treatments $Self Care/Home Management : 8-22 mins G-CodesBenito Mccreedy OTR/L [phone] 11/25/2014, 5:56 PM

## 2014-11-25 NOTE — Care Management Note (Addendum)
CARE MANAGEMENT NOTE 11/25/2014  Patient:  Jeffrey [name], Jeffrey [name]   Account Number:  [id number]  Date Initiated:  11/25/2014  Documentation initiated by:  Ricki Miller  Subjective/Objective Assessment:   80 yr old male admitted with DJD of the left knee. Patient had a left total knee arthroplasty.     Action/Plan:   Patient was preoperatively setup with [place], no changes. Patient will have family support at discharge.   Anticipated DC Date:  11/26/2014   Anticipated DC Plan:  [place]  CM consult      [facility] Choice  HOME HEALTH  DURABLE MEDICAL EQUIPMENT   Choice offered to / List presented to:  C-1 Patient   DME arranged  CP[facility]  WALKER - ROLLING  3-N-1      DME agency  [employer]     HH arranged  HH-2 PT      [place].   Status of service:  Completed, signed off Medicare Important Message given?   (If response is "NO", the following Medicare IM given date fields will be blank) Date Medicare IM given:   Medicare IM given by:   Date Additional Medicare IM given:   Additional Medicare IM given by:    Discharge Disposition:  [place]  Per UR Regulation:  Reviewed for med. necessity/level of care/duration of stay  If discussed at [place] of Stay Meetings, dates discussed:

## 2014-11-25 NOTE — Progress Notes (Signed)
PT Treatment Note    11/25/14 1500  PT Visit Information  Last PT Received On 11/25/14  Assistance Needed +1  History of Present Illness pt is a 80 y.o. male s/p Lt TKA on 11/24/14.  PT Time Calculation  PT Start Time (ACUTE ONLY) 1445  PT Stop Time (ACUTE ONLY) 1510  PT Time Calculation (min) (ACUTE ONLY) 25 min  Subjective Data  Subjective "I'm doing alright."  Patient Stated Goal home soon  Precautions  Precautions Knee  Precaution Comments reviewed no knee under pillow   Required Braces or Orthoses Knee Immobilizer - Left  Restrictions  Weight Bearing Restrictions Yes  LLE Weight Bearing WBAT  Pain Assessment  Pain Assessment 0-10  Pain Score 4  Pain Location left knee  Pain Descriptors / Indicators Aching  Pain Intervention(s) Premedicated before session  Cognition  Arousal/Alertness Awake/alert  Behavior During Therapy WFL for tasks assessed/performed  Overall Cognitive Status Within Functional Limits for tasks assessed  Bed Mobility  Overal bed mobility Needs Assistance  Bed Mobility Supine to Sit  Supine to sit Supervision;HOB elevated  General bed mobility comments increase time and cues for technique; use of handrails  Transfers  Overall transfer level Needs assistance  Equipment used Rolling walker (2 wheeled)  Transfers Sit to/from Stand  Sit to Stand Supervision  General transfer comment cues for hand placement  Ambulation/Gait  Ambulation/Gait assistance Supervision  Ambulation Distance (Feet) 150 Feet  Assistive device Rolling walker (2 wheeled)  Gait Pattern/deviations Step-through pattern;Decreased step length - right;Decreased stance time - left  Gait velocity decreased  General Gait Details cues for step through gt sequencing and safety with RW  Balance  Overall balance assessment Needs assistance  Sitting-balance support Feet supported  Sitting balance-Leahy Scale Good  Standing balance-Leahy Scale Poor  Exercises  Exercises Total Joint   Total Joint Exercises  Ankle Circles/Pumps AROM;Both;10 reps;Seated  Quad Sets AROM;Left;10 reps  Hip ABduction/ADduction AROM;Strengthening;Left;10 reps;Seated  Long [employer] AAROM;Strengthening;Left;5 reps;Seated  PT - End of Session  Equipment Utilized During Treatment Gait belt;Left knee immobilizer  Activity Tolerance Patient tolerated treatment well  Patient left in chair;with call bell/phone within reach;with family/visitor present  Nurse Communication Mobility status;Precautions;Weight bearing status  PT - Assessment/Plan  PT Plan Current plan remains appropriate  PT Frequency (ACUTE ONLY) 7X/week  Recommendations for Other Services OT consult  Follow Up Recommendations Home health PT;Supervision/Assistance - 24 hour  PT equipment None recommended by PT  PT Goal Progression  Progress towards PT goals Progressing toward goals  Acute Rehab PT Goals  PT Goal Formulation With patient  Time For Goal Achievement 11/29/14  Potential to Achieve Goals Good  PT General Charges  $$ ACUTE PT VISIT 1 Procedure  PT Treatments  $Gait Training 23-37 mins   [place], [place] DPT [phone]

## 2014-11-26 LAB — CBC
HEMATOCRIT: 31.7 % — AB (ref 39.0–52.0)
Hemoglobin: 10.4 g/dL — ABNORMAL LOW (ref 13.0–17.0)
MCH: 29.1 pg (ref 26.0–34.0)
MCHC: 32.8 g/dL (ref 30.0–36.0)
MCV: 88.8 fL (ref 78.0–100.0)
PLATELETS: 233 10*3/uL (ref 150–400)
RBC: 3.57 MIL/uL — ABNORMAL LOW (ref 4.22–5.81)
RDW: 14.5 % (ref 11.5–15.5)
WBC: 10.2 10*3/uL (ref 4.0–10.5)

## 2014-11-26 MED ORDER — HYDROCODONE-ACETAMINOPHEN 5-325 MG PO TABS: 1.0000 | ORAL_TABLET | ORAL | Status: DC | PRN

## 2014-11-26 MED ORDER — ASPIRIN 325 MG PO TBEC: 325.0000 mg | DELAYED_RELEASE_TABLET | Freq: Two times a day (BID) | ORAL | Status: DC

## 2014-11-26 MED ORDER — METHOCARBAMOL 500 MG PO TABS: 500.0000 mg | ORAL_TABLET | Freq: Four times a day (QID) | ORAL | Status: DC | PRN

## 2014-11-26 NOTE — Progress Notes (Signed)
Physical Therapy Treatment Patient Details Name: Jeffrey [name] MRN: [id number] DOB: April 19, 1934 Today's Date: 11/26/2014    History of Present Illness pt is a 80 y.o. male s/p Lt TKA on 11/24/14.    PT Comments    Pt moving well.  Completed stair training this session.  Patient safe to D/C from a mobility standpoint based on progression towards goals set on PT eval.    Follow Up Recommendations  Home health PT;Supervision/Assistance - 24 hour     Equipment Recommendations  None recommended by PT    Recommendations for Other Services OT consult     Precautions / Restrictions Precautions Precautions: Knee Required Braces or Orthoses: Knee Immobilizer - Left Restrictions Weight Bearing Restrictions: Yes LLE Weight Bearing: Weight bearing as tolerated    Mobility  Bed Mobility               General bed mobility comments: not assessed  Transfers Overall transfer level: Needs assistance Equipment used: Rolling walker (2 wheeled) Transfers: Sit to/from Stand Sit to Stand: Supervision         General transfer comment: supervision for safety  Ambulation/Gait             General Gait Details: Pt deferring due to getting ready to d/c home & wants to conserve energy   Stairs Stairs: Yes Stairs assistance: Min guard Stair Management: One rail Left;Step to pattern;Forwards Number of Stairs: 3 General stair comments: Pt able to recall & return demonstration of safe technique & sequencing from previous knee surgeries.    Wheelchair Mobility    Modified Rankin (Stroke Patients Only)       Balance Overall balance assessment: Needs assistance         Standing balance support: No upper extremity supported;During functional activity Standing balance-Leahy Scale: Good Standing balance comment: Pt standing x 8 minutes while donning getting dressed.  No UE support.                      Cognition Arousal/Alertness: Awake/alert Behavior During  Therapy: WFL for tasks assessed/performed Overall Cognitive Status: Within Functional Limits for tasks assessed                      Exercises      General Comments        Pertinent Vitals/Pain Pain Assessment: 0-10 Pain Score: 3  Pain Location: Lt knee Pain Descriptors / Indicators: Sore Pain Intervention(s): Monitored during session;Repositioned    Home Living                      Prior Function            PT Goals (current goals can now be found in the care plan section) Acute Rehab PT Goals Patient Stated Goal: not stated PT Goal Formulation: With patient Time For Goal Achievement: 11/29/14 Potential to Achieve Goals: Good Progress towards PT goals: Progressing toward goals    Frequency  7X/week    PT Plan Current plan remains appropriate    Co-evaluation             End of Session   Activity Tolerance: Patient tolerated treatment well Patient left: in chair;with call bell/phone within reach     Time: 1313-1331 PT Time Calculation (min) (ACUTE ONLY): 18 min  Charges:  $Therapeutic Activity: 8-22 mins                    G  Codes:      Sena Hitch 11/26/2014, 1:35 PM   Sarajane Marek, [place] [phone] 11/26/2014

## 2014-11-26 NOTE — Progress Notes (Signed)
Subjective: 2 Days Post-Op Procedure(s) (LRB): TOTAL KNEE ARTHROPLASTY (Left)  Activity level:  wbat Diet tolerance:  Eating well Voiding:  ok Patient reports pain as mild.    Objective: Vital signs in last 24 hours: Temp:  [98.1 F (36.7 C)-99.2 F (37.3 C)] 98.3 F (36.8 C) (12/06 0844) Pulse Rate:  [65-85] 85 (12/06 0504) Resp:  [16] 16 (12/06 0504) BP: (102-119)/(46-57) 102/46 mmHg (12/06 0504) SpO2:  [91 %-96 %] 92 % (12/06 0903) Weight:  [108.41 kg (239 lb)] 108.41 kg (239 lb) (12/05 1345)  Labs:  Recent Labs  11/25/14 0723 11/26/14 0440  HGB 11.3* 10.4*    Recent Labs  11/25/14 0723 11/26/14 0440  WBC 13.3* 10.2  RBC 3.96* 3.57*  HCT 34.6* 31.7*  PLT 243 233    Recent Labs  11/25/14 0723  NA 138  K 4.5  CL 100  CO2 25  BUN 17  CREATININE 1.14  GLUCOSE 125*  CALCIUM 9.1   No results for input(s): LABPT, INR in the last 72 hours.  Physical Exam:  Neurologically intact ABD soft Neurovascular intact Sensation intact distally Intact pulses distally Dorsiflexion/Plantar flexion intact Incision: dressing C/D/I and scant drainage No cellulitis present Compartment soft  Assessment/Plan:  2 Days Post-Op Procedure(s) (LRB): TOTAL KNEE ARTHROPLASTY (Left) Advance diet Up with therapy Discharge home with home health today. Continue on asa 325mg  BID x 2 weeks post op. Follow up in office 2 weeks post op. Dressing changed to aquacel today.    Rhett Najera, Larwance Sachs 11/26/2014, 10:06 AM

## 2014-11-26 NOTE — Progress Notes (Signed)
Occupational Therapy Treatment Patient Details Name: AMARDEEP BECKERS MRN: [id number] DOB: [birth year] Today's Date: 11/26/2014    History of present illness pt is a 80 y.o. male s/p Lt TKA on 11/24/14.   OT comments  Education provided to pt/spouse. Pt not feeling well today.   Follow Up Recommendations  No OT follow up;Supervision - Intermittent    Equipment Recommendations  Other (comment) (AE)    Recommendations for Other Services      Precautions / Restrictions Precautions Precautions: Knee Precaution Comments: reviewed knee precautions Required Braces or Orthoses: Knee Immobilizer - Left Restrictions Weight Bearing Restrictions: Yes LLE Weight Bearing: Weight bearing as tolerated       Mobility Bed Mobility  General bed mobility comments: not assessed  Transfers Overall transfer level: Needs assistance Equipment used: Rolling walker (2 wheeled) Transfers: Sit to/from Stand Sit to Stand: Min guard         General transfer comment: cues for technique.        ADL Overall ADL's : Needs assistance/impaired     Grooming: Wash/dry hands;Set up;Supervision/safety;Standing                   Toilet Transfer: Min guard;Ambulation;RW   Toileting- Clothing Manipulation and Hygiene: Min guard (standing to urinate)       Functional mobility during ADLs: Min guard;Rolling walker General ADL Comments: Educated on tub transfer technique (wife verified shower chair is flat). Pt did not want to practice today. Also discussed car transfer. Reviewed LB dressing technique. Reviewed to use bag on walker. Suggested for pt to practice tub transfer with [facility] therapist prior to them doing it if he leaves here before another OT session. Gave nurse bigger sockaid to switch out with pt's smaller one when wife purchases it from gift shop.      Vision                     Perception     Praxis      Cognition  Awake/Alert Behavior During Therapy: WFL for tasks  assessed/performed Overall Cognitive Status: Within Functional Limits for tasks assessed                       Extremity/Trunk Assessment               Exercises     Shoulder Instructions       General Comments      Pertinent Vitals/ Pain       Pain Assessment: 0-10 Pain Score: 4  Pain Location: left knee Pain Intervention(s): Repositioned;Ice applied;Monitored during session  Home Living                                          Prior Functioning/Environment              Frequency Min 2X/week     Progress Toward Goals  OT Goals(current goals can now be found in the care plan section)  Progress towards OT goals: Not progressing toward goals - comment  Acute Rehab OT Goals Patient Stated Goal: not stated OT Goal Formulation: With patient Time For Goal Achievement: 12/02/14 Potential to Achieve Goals: Good ADL Goals Pt Will Perform Lower Body Dressing: sit to/from stand;with adaptive equipment;with set-up;with supervision Pt Will Transfer to Toilet: with modified independence;ambulating Pt Will Perform Tub/Shower Transfer: Tub transfer;with supervision;ambulating;shower seat;rolling walker  Plan Discharge plan remains appropriate    Co-evaluation                 End of Session Equipment Utilized During Treatment: Gait belt;Rolling walker;Left knee immobilizer CPM Left Knee CPM Left Knee: Off   Activity Tolerance Patient limited by pain;Other (comment) (nausea)   Patient Left in chair;with call bell/phone within reach;with family/visitor present   Nurse Communication Other (comment) (gave nurse sockaid to switch out with pt)        Time: 4132-4401 OT Time Calculation (min): 19 min  Charges: OT General Charges $OT Visit: 1 Procedure OT Treatments $Self Care/Home Management : 8-22 mins  Benito Mccreedy OTR/L [phone] 11/26/2014, 10:39 AM

## 2014-11-26 NOTE — Progress Notes (Signed)
Discharge instructions and prescriptions reviewed with patient. Patient denies questions or concerns at this time. IV removed with no complications. VS stable. Patient has  3 in 1, walker, and CPM at home.  Patient discharged via wheelchair with personal belongings prescriptions and discharge packet.

## 2014-11-26 NOTE — Discharge Summary (Signed)
Patient ID: Jeffrey [name] MRN: [id number] DOB/AGE: 78-11-1934 78 y.o.  Admit date: 11/24/2014 Discharge date: 11/26/2014  Admission Diagnoses:  Principal Problem:   Primary osteoarthritis of left knee Active Problems:   Primary osteoarthritis of knee   Discharge Diagnoses:  Same  Past Medical History  Diagnosis Date  . High cholesterol   . GERD (gastroesophageal reflux disease)   . Anxiety   . Coronary artery disease   . Hypothyroidism   . H/O hiatal hernia   . Ischemic cardiomyopathy   . Aortic stenosis     mild AS by 07/20/13 echo ([facility])  . ITP (idiopathic thrombocytopenic purpura)     Dr. Gaynelle Arabian, on [place]  . Myocardial infarction 07-Mar-2010  . Pneumonia 1940's X 1; 03/07/14 X 1  . History of blood transfusion reaction 03-07-2013    "he almost died; he has to get irradiated blood next time"  . Arthritis     "all over"  . Depression   . Hypertension   . Shortness of breath     with exertion, has not been very active    Surgeries: Procedure(s): TOTAL KNEE ARTHROPLASTY on 11/24/2014   Consultants:    Discharged Condition: Improved  Hospital Course: Jeffrey [name] is an 80 y.o. male who was admitted 11/24/2014 for operative treatment ofPrimary osteoarthritis of left knee. Patient has severe unremitting pain that affects sleep, daily activities, and work/hobbies. After pre-op clearance the patient was taken to the operating room on 11/24/2014 and underwent  Procedure(s): TOTAL KNEE ARTHROPLASTY.    Patient was given perioperative antibiotics: Anti-infectives    Start     Dose/Rate Route Frequency Ordered Stop   11/24/14 2030  ceFAZolin (ANCEF) IVPB 2 g/50 mL premix     2 g100 mL/hr over 30 Minutes Intravenous Every 6 hours 11/24/14 1955 11/25/14 0336   11/24/14 0600  ceFAZolin (ANCEF) IVPB 2 g/50 mL premix     2 g100 mL/hr over 30 Minutes Intravenous On call to O.R. 11/23/14 1411 11/24/14 1444       Patient was given sequential compression devices,  early ambulation, and chemoprophylaxis to prevent DVT.  Patient benefited maximally from hospital stay and there were no complications.    Recent vital signs: Patient Vitals for the past 24 hrs:  BP Temp Temp src Pulse Resp SpO2 Height Weight  11/26/14 0903 - - - - - 92 % - -  11/26/14 0844 - 98.3 F (36.8 C) Oral - - - - -  11/26/14 0504 (!) 102/46 mmHg 99.2 F (37.3 C) Oral 85 16 91 % - -  11/25/14 03/07/2106 - - - - - 95 % - -  11/25/14 2027-03-08 (!) 119/49 mmHg 98.1 F (36.7 C) Oral 65 16 91 % - -  11/25/14 1708 (!) 105/57 mmHg 98.3 F (36.8 C) Oral 65 16 96 % - -  11/25/14 1345 - - - - - - 6\' 1"  (1.854 m) 108.41 kg (239 lb)     Recent laboratory studies:  Recent Labs  11/25/14 0723 11/26/14 0440  WBC 13.3* 10.2  HGB 11.3* 10.4*  HCT 34.6* 31.7*  PLT 243 233  NA 138  --   K 4.5  --   CL 100  --   CO2 25  --   BUN 17  --   CREATININE 1.14  --   GLUCOSE 125*  --   CALCIUM 9.1  --      Discharge Medications:     Medication List  TAKE these medications        aspirin 325 MG EC tablet  Take 1 tablet (325 mg total) by mouth 2 (two) times daily after a meal.     diphenhydrAMINE 25 MG tablet  Commonly known as:  BENADRYL  Take 25 mg by mouth every 6 (six) hours as needed for itching.     eltrombopag 25 MG tablet  Commonly known as:  PROMACTA  Take 25 mg by mouth 2 (two) times a week. Take on an empty stomach, 1 hour before a meal or 2 hours after.     FISH OIL PO  Take 1 capsule by mouth daily.     FLUoxetine 10 MG capsule  Commonly known as:  PROZAC  Take 10 mg by mouth at bedtime.     fluticasone-salmeterol 230-21 MCG/ACT inhaler  Commonly known as:  ADVAIR HFA  Inhale 1 puff into the lungs 2 (two) times daily.     gabapentin 100 MG capsule  Commonly known as:  NEURONTIN  Take 100 mg by mouth 3 (three) times daily.     HYDROcodone-acetaminophen 5-325 MG per tablet  Commonly known as:  NORCO/VICODIN  Take 1-2 tablets by mouth every 4 (four) hours as needed  (breakthrough pain).     levothyroxine 175 MCG tablet  Commonly known as:  SYNTHROID, LEVOTHROID  Take 175 mcg by mouth at bedtime.     losartan 25 MG tablet  Commonly known as:  COZAAR  Take 25 mg by mouth daily.     methocarbamol 500 MG tablet  Commonly known as:  ROBAXIN  Take 1 tablet (500 mg total) by mouth every 6 (six) hours as needed for muscle spasms.     metoprolol succinate 25 MG 24 hr tablet  Commonly known as:  TOPROL-XL  Take 25 mg by mouth daily.     omeprazole 40 MG capsule  Commonly known as:  PRILOSEC  Take 40 mg by mouth daily.        Diagnostic Studies: No results found.  Disposition: 06-Home-Health Care Svc      Discharge Instructions    Call MD / Call 911    Complete by:  As directed   If you experience chest pain or shortness of breath, CALL 911 and be transported to the hospital emergency room.  If you develope a fever above 101 F, pus (white drainage) or increased drainage or redness at the wound, or calf pain, call your surgeon's office.     Constipation Prevention    Complete by:  As directed   Drink plenty of fluids.  Prune juice may be helpful.  You may use a stool softener, such as Colace (over the counter) 100 mg twice a day.  Use MiraLax (over the counter) for constipation as needed.     Diet - low sodium heart healthy    Complete by:  As directed      Increase activity slowly as tolerated    Complete by:  As directed            Follow-up Information    Follow up with Hessie Dibble, MD. Call in 2 weeks.   Specialty:  Orthopedic Surgery   Contact information:   [place] [address] [phone]        Signed: Rich Fuchs 11/26/2014, 10:09 AM

## 2014-11-26 NOTE — Progress Notes (Signed)
Physical Therapy Treatment Patient Details Name: Jeffrey [name] MRN: [id number] DOB: 26-Nov-1934 Today's Date: 11/26/2014    History of Present Illness pt is a 80 y.o. male s/p Lt TKA on 11/24/14.    PT Comments    Pt greatly limited in session secondary to nausea. Mobilizing very slowly. Encouraged OOB activity and inspirometer. Will cont to follow per POC.   Follow Up Recommendations  Home health PT;Supervision/Assistance - 24 hour     Equipment Recommendations  None recommended by PT    Recommendations for Other Services OT consult     Precautions / Restrictions Precautions Precautions: Knee Precaution Comments: reviewed knee precautions Required Braces or Orthoses: Knee Immobilizer - Left Restrictions Weight Bearing Restrictions: Yes LLE Weight Bearing: Weight bearing as tolerated    Mobility  Bed Mobility Overal bed mobility: Needs Assistance Bed Mobility: Supine to Sit     Supine to sit: Supervision     General bed mobility comments: pt relying heavily on trapeze bar; cues for sequencing   Transfers Overall transfer level: Needs assistance Equipment used: Rolling walker (2 wheeled) Transfers: Sit to/from Stand Sit to Stand: Supervision         General transfer comment: cues for hand placement  Ambulation/Gait Ambulation/Gait assistance: Supervision Ambulation Distance (Feet): 60 Feet Assistive device: Rolling walker (2 wheeled) Gait Pattern/deviations: Step-to pattern;Decreased stance time - left;Decreased step length - right;Trunk flexed;Wide base of support;Antalgic Gait velocity: decreased Gait velocity interpretation: Below normal speed for age/gender General Gait Details: cues for step through gt sequencing and upright posture; pt limited due to nausea    Stairs            Wheelchair Mobility    Modified Rankin (Stroke Patients Only)       Balance Overall balance assessment: Needs assistance Sitting-balance support: Feet  supported;No upper extremity supported Sitting balance-Leahy Scale: Good Sitting balance - Comments: sat EOB ~4 min; initially c/o dizziness; resolved with deep breathing    Standing balance support: During functional activity;Bilateral upper extremity supported Standing balance-Leahy Scale: Poor                      Cognition Arousal/Alertness: Awake/alert Behavior During Therapy: WFL for tasks assessed/performed Overall Cognitive Status: Within Functional Limits for tasks assessed                      Exercises      General Comments General comments (skin integrity, edema, etc.): pt greatly limited due to nausea this session; encouraged inspirometer      Pertinent Vitals/Pain Pain Assessment: 0-10 Pain Score: 4  Pain Location: Lt knee  Pain Descriptors / Indicators: Aching Pain Intervention(s): Monitored during session;Premedicated before session;Repositioned;Ice applied    Home Living                      Prior Function            PT Goals (current goals can now be found in the care plan section) Acute Rehab PT Goals Patient Stated Goal: to not feel so hot PT Goal Formulation: With patient Time For Goal Achievement: 11/29/14 Potential to Achieve Goals: Good Progress towards PT goals: Progressing toward goals    Frequency  7X/week    PT Plan Current plan remains appropriate    Co-evaluation             End of Session Equipment Utilized During Treatment: Gait belt;Left knee immobilizer Activity Tolerance: Patient tolerated treatment  well Patient left: in chair;with call bell/phone within reach;with family/visitor present     Time: 0815-0840 PT Time Calculation (min) (ACUTE ONLY): 25 min  Charges:  [employer] Training: 23-37 mins                    G Codes:      Gustavus Bryant, [place]  [phone] 11/26/2014, 9:29 AM

## 2014-11-27 ENCOUNTER — Encounter (HOSPITAL_COMMUNITY): Payer: Self-pay | Admitting: Orthopaedic Surgery

## 2014-11-30 LAB — TYPE AND SCREEN
ABO/RH(D): A POS
ANTIBODY SCREEN: NEGATIVE
UNIT DIVISION: 0
Unit division: 0

## 2015-07-06 HISTORY — PX: TOTAL ANKLE ARTHROPLASTY: SHX811

## 2015-07-06 HISTORY — PX: GASTROC RECESSION EXTREMITY: SHX6262

## 2016-01-21 ENCOUNTER — Telehealth: Payer: Self-pay | Admitting: Orthopedic Surgery

## 2016-01-30 NOTE — Telephone Encounter (Signed)
Left message for patient to return call.

## 2016-02-04 NOTE — Telephone Encounter (Signed)
Left message informing patient of Dr Tollie Pizza [phone]

## 2016-06-09 HISTORY — PX: TOTAL HIP ARTHROPLASTY: SHX124

## 2016-06-12 ENCOUNTER — Encounter (HOSPITAL_COMMUNITY): Payer: Self-pay | Admitting: Emergency Medicine

## 2016-06-12 ENCOUNTER — Emergency Department (HOSPITAL_COMMUNITY)
Admission: EM | Admit: 2016-06-12 | Discharge: 2016-06-12 | Disposition: A | Discharge status: Home / Self Care | Payer: Medicare Other | Attending: Emergency Medicine | Admitting: Emergency Medicine

## 2016-06-12 DIAGNOSIS — I2510 Atherosclerotic heart disease of native coronary artery without angina pectoris: Secondary | ICD-10-CM | POA: Diagnosis not present

## 2016-06-12 DIAGNOSIS — Z79899 Other long term (current) drug therapy: Secondary | ICD-10-CM | POA: Diagnosis not present

## 2016-06-12 DIAGNOSIS — Z7982 Long term (current) use of aspirin: Secondary | ICD-10-CM | POA: Diagnosis not present

## 2016-06-12 DIAGNOSIS — I252 Old myocardial infarction: Secondary | ICD-10-CM | POA: Insufficient documentation

## 2016-06-12 DIAGNOSIS — I10 Essential (primary) hypertension: Secondary | ICD-10-CM | POA: Insufficient documentation

## 2016-06-12 DIAGNOSIS — F410 Panic disorder [episodic paroxysmal anxiety] without agoraphobia: Secondary | ICD-10-CM | POA: Diagnosis not present

## 2016-06-12 DIAGNOSIS — F329 Major depressive disorder, single episode, unspecified: Secondary | ICD-10-CM | POA: Diagnosis not present

## 2016-06-12 DIAGNOSIS — E039 Hypothyroidism, unspecified: Secondary | ICD-10-CM | POA: Diagnosis not present

## 2016-06-12 DIAGNOSIS — Z87891 Personal history of nicotine dependence: Secondary | ICD-10-CM | POA: Diagnosis not present

## 2016-06-12 DIAGNOSIS — M1990 Unspecified osteoarthritis, unspecified site: Secondary | ICD-10-CM | POA: Insufficient documentation

## 2016-06-12 LAB — CBC
HEMATOCRIT: 38.8 % — AB (ref 39.0–52.0)
Hemoglobin: 13.1 g/dL (ref 13.0–17.0)
MCH: 29.9 pg (ref 26.0–34.0)
MCHC: 33.8 g/dL (ref 30.0–36.0)
MCV: 88.6 fL (ref 78.0–100.0)
Platelets: 224 10*3/uL (ref 150–400)
RBC: 4.38 MIL/uL (ref 4.22–5.81)
RDW: 14.4 % (ref 11.5–15.5)
WBC: 11.7 10*3/uL — ABNORMAL HIGH (ref 4.0–10.5)

## 2016-06-12 LAB — BASIC METABOLIC PANEL
Anion gap: 8 (ref 5–15)
BUN: 21 mg/dL — ABNORMAL HIGH (ref 6–20)
CALCIUM: 8.7 mg/dL — AB (ref 8.9–10.3)
CO2: 23 mmol/L (ref 22–32)
CREATININE: 1.08 mg/dL (ref 0.61–1.24)
Chloride: 103 mmol/L (ref 101–111)
GFR calc non Af Amer: >60 mL/min (ref >60)
GLUCOSE: 126 mg/dL — AB (ref 65–99)
Potassium: 3.9 mmol/L (ref 3.5–5.1)
Sodium: 134 mmol/L — ABNORMAL LOW (ref 135–145)

## 2016-06-12 MED ORDER — ALPRAZOLAM 0.25 MG PO TABS: 0.2500 mg | ORAL_TABLET | Freq: Three times a day (TID) | ORAL | Status: DC | PRN

## 2016-06-12 MED ORDER — LORAZEPAM 2 MG/ML IJ SOLN
1.0000 mg | Freq: Once | INTRAMUSCULAR | Status: AC
  Administered 2016-06-12: 1 mg via INTRAVENOUS
  Filled 2016-06-12: qty 1

## 2016-06-12 NOTE — ED Notes (Addendum)
Pt's reports sudden onset of a panic attack that began approx 60 min. Pt tearful upon assessment. Wife reports giving pt a half of anxiety medication PTA. Pt also c/o itching to both feet. When pt asked if he is having SI, pt reports "yes, anything that will help." Denies any plan at this time.

## 2016-06-12 NOTE — ED Provider Notes (Signed)
CSN: [id number]     Arrival date & time 06/12/16  1839 History   First MD Initiated Contact with Patient 06/12/16 1906     Chief Complaint  Patient presents with  . Panic Attack   HPI Patient presents to the emergency room with complaints of a severe panic attack that started about 90 minutes ago. The patient has a history of panic attacks and claustrophobia. He has had similar episodes like this in the past. Patient tried taking a half tablet of his medication at home but it has not helped yet.  Patient denies any suicidal or homicidal ideation. He was confused by the triage questions when asked he was having suicidal ideations. He states he definitely is not suicidal he just wants something to help him with this anxiety. Past Medical History  Diagnosis Date  . High cholesterol   . GERD (gastroesophageal reflux disease)   . Anxiety   . Coronary artery disease   . Hypothyroidism   . H/O hiatal hernia   . Ischemic cardiomyopathy   . Aortic stenosis     mild AS by 07/20/13 echo ([facility])  . ITP (idiopathic thrombocytopenic purpura)     Dr. Gaynelle Arabian, on [place]  . Myocardial infarction ([place]) 03/23/10  . Pneumonia 1940's X 1; March 23, 2014 X 1  . History of blood transfusion reaction 03-23-13    "he almost died; he has to get irradiated blood next time"  . Arthritis     "all over"  . Depression   . Hypertension   . Shortness of breath     with exertion, has not been very active   Past Surgical History  Procedure Laterality Date  . Hernia repair    . Appendectomy    . Back surgery    . Knee arthroscopy Left 06/22/2014    w/I&D  . Hiatal hernia repair    . Coronary angioplasty with stent placement      "think I have 3-4 stents" (06/22/2014)  . Coronary artery bypass graft  2010-03-23    "CABG X3"  . Sternal closure      "wires from OHS taken out; plate put in" (0/08/9832)  . Lumbar disc surgery  1960's?  . Knee arthroscopy Left 06/22/2014    Procedure: IRRIGATION AND DEBRIDEMENT  WITH CHRONDROPLASTY;  Surgeon: Hessie Dibble, MD;  Location: [place];  Service: Orthopedics;  Laterality: Left;  . Synovectomy Left 08/17/2014    Procedure: SYNOVECTOMY LEFT KNEE;  Surgeon: Hessie Dibble, MD;  Location: [place];  Service: Orthopedics;  Laterality: Left;  . Total knee arthroplasty Left 11/24/2014    Procedure: TOTAL KNEE ARTHROPLASTY;  Surgeon: Hessie Dibble, MD;  Location: [place];  Service: Orthopedics;  Laterality: Left;  . Total hip arthroplasty Left    Family History  Problem Relation Age of Onset  . Heart disease    . Arthritis     Social History  Substance Use Topics  . Smoking status: Former Smoker -- 1.00 packs/day for 15 years    Types: Cigarettes  . Smokeless tobacco: Former Systems developer     Comment: "quit smoking ~ late ~ 60's; quit chewing in the 1970's"  . Alcohol Use: No    Review of Systems    Allergies  Lipitor; Methylprednisolone; and Doxycycline  Home Medications   Prior to Admission medications   Medication Sig Start Date End Date Taking? Authorizing Provider  ADVAIR DISKUS 100-50 MCG/DOSE AEPB Inhale 1 puff into the lungs 2 (two) times daily.  05/06/16  Yes Historical Provider, MD  carvedilol (COREG) 6.25 MG tablet Take 6.25 mg by mouth 2 (two) times daily with a meal.   Yes Historical Provider, MD  fluticasone-salmeterol (ADVAIR HFA) 230-21 MCG/ACT inhaler Inhale 1 puff into the lungs 2 (two) times daily.    Yes Historical Provider, MD  levothyroxine (SYNTHROID, LEVOTHROID) 200 MCG tablet Take 200 mcg by mouth daily before breakfast.   Yes Historical Provider, MD  losartan (COZAAR) 25 MG tablet Take 25 mg by mouth daily.   Yes Historical Provider, MD  omeprazole (PRILOSEC) 40 MG capsule Take 40 mg by mouth daily.   Yes Historical Provider, MD  traMADol (ULTRAM) 50 MG tablet Take 25 mg by mouth 4 (four) times daily as needed for moderate pain.   Yes Historical Provider, MD  ALPRAZolam (XANAX) 0.25 MG tablet Take 1 tablet (0.25 mg total) by mouth 3  (three) times daily as needed for anxiety. 06/12/16   Dorie Rank, MD  aspirin EC 325 MG EC tablet Take 1 tablet (325 mg total) by mouth 2 (two) times daily after a meal. Patient not taking: Reported on 06/12/2016 11/26/14   Loni Dolly, PA-C  diphenhydrAMINE (BENADRYL) 25 MG tablet Take 25 mg by mouth every 6 (six) hours as needed for itching.    Historical Provider, MD  HYDROcodone-acetaminophen (NORCO/VICODIN) 5-325 MG per tablet Take 1-2 tablets by mouth every 4 (four) hours as needed (breakthrough pain). Patient not taking: Reported on 06/12/2016 11/26/14   Loni Dolly, PA-C  methocarbamol (ROBAXIN) 500 MG tablet Take 1 tablet (500 mg total) by mouth every 6 (six) hours as needed for muscle spasms. Patient not taking: Reported on 06/12/2016 11/26/14   Loni Dolly, PA-C   BP 173/91 mmHg  Pulse 78  Temp(Src) 97.2 F (36.2 C) (Temporal)  Resp 24  Ht '6\' 1"'$  (1.854 m)  Wt 99.791 kg  BMI 29.03 kg/m2  SpO2 99% Physical Exam  Constitutional: He appears distressed.  Patient appears anxious and pacing, the exam is conducted with him standing  HENT:  Head: Normocephalic and atraumatic.  Right Ear: External ear normal.  Left Ear: External ear normal.  Eyes: Conjunctivae are normal. Right eye exhibits no discharge. Left eye exhibits no discharge. No scleral icterus.  Neck: Neck supple. No tracheal deviation present.  Cardiovascular: Normal rate, regular rhythm and intact distal pulses.   Pulmonary/Chest: Effort normal and breath sounds normal. No stridor. No respiratory distress. He has no wheezes. He has no rales.  Abdominal: Soft. Bowel sounds are normal. He exhibits no distension. There is no tenderness. There is no rebound and no guarding.  Musculoskeletal: He exhibits no edema.  Neurological: He is alert. He has normal strength. No cranial nerve deficit (no facial droop, extraocular movements intact, no slurred speech) or sensory deficit. He exhibits normal muscle tone. He displays no seizure  activity. Coordination normal.  Skin: Skin is warm and dry. No rash noted. He is not diaphoretic.  Psychiatric: His mood appears anxious. He expresses no homicidal and no suicidal ideation. He expresses no suicidal plans and no homicidal plans.  Nursing note and vitals reviewed.   ED Course  Procedures (including critical care time) Labs Review Labs Reviewed  CBC - Abnormal; Notable for the following:    WBC 11.7 (*)    HCT 38.8 (*)    All other components within normal limits  BASIC METABOLIC PANEL - Abnormal; Notable for the following:    Sodium 134 (*)    Glucose, Bld 126 (*)  BUN 21 (*)    Calcium 8.7 (*)    All other components within normal limits    Medications given in the ED Medications  LORazepam (ATIVAN) injection 1 mg (1 mg Intravenous Given 06/12/16 1926)      MDM   Final diagnoses:  Panic attack    Patient's symptoms improved after a dose of Ativan. He is more comfortable now and able to lie down in bed.  I doubt any acute medical issue causing his anxiety. He is not suicidal or homicidal.  Will dc home with an RX for xanax, low dose.  Follow up with PCP    Dorie Rank, MD 06/12/16 2032

## 2016-06-12 NOTE — Discharge Instructions (Signed)

## 2017-03-13 ENCOUNTER — Other Ambulatory Visit: Payer: Self-pay | Admitting: Orthopaedic Surgery

## 2017-03-13 DIAGNOSIS — M25511 Pain in right shoulder: Secondary | ICD-10-CM

## 2017-03-14 ENCOUNTER — Encounter (INDEPENDENT_AMBULATORY_CARE_PROVIDER_SITE_OTHER): Payer: Self-pay

## 2017-03-14 ENCOUNTER — Ambulatory Visit
Admission: RE | Admit: 2017-03-14 | Discharge: 2017-03-14 | Disposition: A | Discharge status: Home / Self Care | Payer: Medicare Other | Source: Ambulatory Visit | Attending: Orthopaedic Surgery | Admitting: Orthopaedic Surgery

## 2017-03-14 DIAGNOSIS — M25511 Pain in right shoulder: Secondary | ICD-10-CM

## 2017-03-22 ENCOUNTER — Other Ambulatory Visit: Payer: Medicare Other

## 2017-04-06 ENCOUNTER — Other Ambulatory Visit: Payer: Self-pay | Admitting: Orthopaedic Surgery

## 2017-04-08 ENCOUNTER — Encounter (HOSPITAL_COMMUNITY): Payer: Self-pay | Admitting: *Deleted

## 2017-04-08 NOTE — Progress Notes (Signed)
Anesthesia Chart Review:  Pt is a same day work up.   Pt is an 83 year old male scheduled for R shoulder arthroscopy on 04/09/2017 with Melrose Nakayama, MD.   PCP is Dr. [name] with Internal Medicine in [place], [place] is Dr. Gaynelle Arabian in [place], [place] Cardiologist is Dr. Orpah Greek in [place], [place] who cleared pt for surgery.     PMH includes:  CAD (s/p CABG 2011; DES to CX 2012; DES to LAD 2013), ischemic cardiomyopathy, aortic stenosis, ITP, HTN, hyperlipidemia, hypothyroidism, GERD. Former smoker. BMI 29. S/p L THA 06/09/16 (at [facility], notes in care everywhere). S/p R ankle arthroplasty 07/06/15 ([facility], notes in care everywhere). S/p L TKA 11/24/14.  Pt reports he had a reaction to a blood transfusion in the past and can only receive irradiated blood products.   Medications include: ASA 81 mg, carvedilol, levothyroxine, losartan, Prilosec.  Labs will be obtained DOS.   EKG will be obtained DOS.   05/15/2016 NM Stress test (Dr. Ammie Ferrier office): "large fixed inferolateral defect with some peri-infarct ischemia. 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 Ammie Ferrier office): Moderate LVH, LA mildly dilated, grade 1 diastolic dysfunction, interior basilar lateral hypokinesia, mild AI, MR and PI, mild aortic stenosis. EF 50-55%  Cardiac cath on 07/18/2013 [facility]; scanned under Media tab, Correspondence 06/22/14):  - RA pressure mildly elevated at 7 mmHg. RV pressure mildly elevated at 30/10 mmHg. PA pressure mildly elevated at 30/10 with a mean of 20 mmHg. PCWP mildly elevated at 15 mmHg. CO 9.0 L/min. CI normal at 3.9 L/min2.  - RCA non-dominant.  - LIMA to mid/distal LAD patent.  - LM 20-30%.  - Proximal LAD 95%, mid LAD 70-80%. Large DIAG with 60% ostial and 50% proximal stenosis.  - Proximal CX 10-20%. 40-50% mid CX. 30% distal CX. OM1 100% occluded with distal vessel filling via left to left collaterals. 40% OM2. PDA coming off of CX with 80% stenosis  just prior to the touchdown of the known bypass graft.  - Known occlusion of SVG to OM and SVG to PDA. -  Medical therapy was recommended. Titrate b-blocker to try to increase competitive collateralization to the occluded OM vessel.  If labs and EKG acceptable DOS, I anticipate pt can proceed as scheduled.   Willeen Cass, FNP-BC [facility] Short Stay Surgical Center/Anesthesiology Phone: [phone] 04/08/2017 3:02 PM

## 2017-04-08 NOTE — H&P (Signed)
Jeffrey [name] is an 83 y.o. male.   Chief Complaint: Right shoulder pain HPI: Jeffrey [name] continues with some terrible right shoulder pain. He has a catch as he brings his arm up and down. He has trouble resting at night. He says his son has just been through a big rotator cuff repair operation and is struggling. The injection we gave him in the shoulder last summer helped for a little while. He's never had any surgery on the shoulder.   MRI:  I reviewed an MRI scan films and report of a study done at [facility] on 03/10/17. This shows a retracted cuff tear 4 centimeters back. Biceps tendon looks intact.  Past Medical History:  Diagnosis Date  . Anxiety   . Aortic stenosis    mild AS by 07/20/13 echo ([facility])  . Arthritis    "all over"  . Coronary artery disease   . Depression   . GERD (gastroesophageal reflux disease)   . H/O hiatal hernia   . High cholesterol   . History of blood transfusion reaction 03-10-2013   "he almost died; he has to get irradiated blood next time"  . Hypertension   . Hypothyroidism   . Ischemic cardiomyopathy   . ITP (idiopathic thrombocytopenic purpura)    Dr. Gaynelle Arabian, on [place]  . Myocardial infarction 03/10/10  . Pneumonia 1940's X 1; Mar 10, 2014 X 1  . Shortness of breath    with exertion, has not been very active    Past Surgical History:  Procedure Laterality Date  . APPENDECTOMY    . BACK SURGERY    . CORONARY ANGIOPLASTY WITH STENT PLACEMENT     "think I have 3-4 stents" (06/22/2014)  . CORONARY ARTERY BYPASS GRAFT  03/10/2010   "CABG X3"  . HERNIA REPAIR    . HIATAL HERNIA REPAIR    . KNEE ARTHROSCOPY Left 06/22/2014   w/I&D  . KNEE ARTHROSCOPY Left 06/22/2014   Procedure: [place];  Surgeon: Hessie Dibble, MD;  Location: [place];  Service: Orthopedics;  Laterality: Left;  . LUMBAR DISC SURGERY  1960's?  [name] STERNAL CLOSURE     "wires from OHS taken out; plate put in" (01/28/622)  . SYNOVECTOMY Left  08/17/2014   Procedure: SYNOVECTOMY LEFT KNEE;  Surgeon: Hessie Dibble, MD;  Location: [place];  Service: Orthopedics;  Laterality: Left;  . TOTAL HIP ARTHROPLASTY Left   . TOTAL KNEE ARTHROPLASTY Left 11/24/2014   Procedure: TOTAL KNEE ARTHROPLASTY;  Surgeon: Hessie Dibble, MD;  Location: [place];  Service: Orthopedics;  Laterality: Left;    Family History  Problem Relation Age of Onset  . Heart disease    . Arthritis     Social History:  reports that he has quit smoking. His smoking use included Cigarettes. He has a 15.00 pack-year smoking history. He has quit using smokeless tobacco. He reports that he does not drink alcohol or use drugs.  Allergies:  Allergies  Allergen Reactions  . Lipitor [Atorvastatin] Other (See Comments)    muscle spasms cramps  . Methylprednisolone Other (See Comments)    cramps cramps  . Other     If patient is to receive blood - blood must be treated witgh radiation because his body will not accept a normal infusion   . Doxycycline Rash    Doxycycline caused itching redness on scalp and head Doxycycline caused itching redness on scalp and head    No prescriptions prior to admission.  No results found for this or any previous visit (from the past 48 hour(s)). No results found.  Review of Systems  Musculoskeletal: Positive for joint pain.       Right shoulder  All other systems reviewed and are negative.   There were no vitals taken for this visit. Physical Exam  Constitutional: He is oriented to person, place, and time. He appears well-developed and well-nourished.  HENT:  Head: Normocephalic and atraumatic.  Eyes: Pupils are equal, round, and reactive to light.  Neck: Normal range of motion.  Cardiovascular: Normal rate and regular rhythm.   Respiratory: Effort normal.  GI: Soft.  Musculoskeletal:  Right shoulder motion is full active and passive but he has a positive drop arm test and a painful arc as he is bringing it up. He has  weakness in both positions. I don't get any pain at the [facility] joint. He has markedly positive impingement pain. Cervical motion does not cause any arm pain and there is no palpable lymphadenopathy.   Neurological: He is alert and oriented to person, place, and time.  Skin: Skin is warm and dry.  Psychiatric: He has a normal mood and affect. His behavior is normal. Judgment and thought content normal.     Assessment/Plan Assessment: Right shoulder retracted cuff tear by recent MRI injected 07/26/16  Plan: I think we can help Jeffrey [name] with a shoulder arthroscopy. He is not really interested in a cuff repair but I think we can simply do a debridement and aggressive acromioplasty and he ought to do well. I reviewed risk of anesthesia and infection. I stressed the importance of some postoperative rehabilitation to optimize his result.   Jaelynn Pozo, Larwance Sachs, PA-C 04/08/2017, 8:29 AM

## 2017-04-08 NOTE — Progress Notes (Signed)
Pre-op instructions provided to pt only; please complete pt assessments on DOS. Pt made aware to stop taking vitamins fish oil and herbal medications. Do not take any NSAIDs ie: Ibuprofen, Advil, Naproxen, BC and Goody Powder. Pt verbalized understanding of all pre-op instructions. Anesthesia asked to review pt history ( see note).

## 2017-04-09 ENCOUNTER — Ambulatory Visit (HOSPITAL_COMMUNITY): Payer: Medicare Other | Admitting: Emergency Medicine

## 2017-04-09 ENCOUNTER — Encounter (HOSPITAL_COMMUNITY): Payer: Self-pay | Admitting: *Deleted

## 2017-04-09 ENCOUNTER — Encounter (HOSPITAL_COMMUNITY)
Admission: RE | Disposition: A | Discharge status: Home / Self Care | Payer: Self-pay | Source: Ambulatory Visit | Attending: Orthopaedic Surgery

## 2017-04-09 ENCOUNTER — Ambulatory Visit (HOSPITAL_COMMUNITY)
Admission: RE | Admit: 2017-04-09 | Discharge: 2017-04-09 | Disposition: A | Discharge status: Home / Self Care | Payer: Medicare Other | Source: Ambulatory Visit | Attending: Orthopaedic Surgery | Admitting: Orthopaedic Surgery

## 2017-04-09 ENCOUNTER — Ambulatory Visit (HOSPITAL_COMMUNITY): Payer: Medicare Other

## 2017-04-09 DIAGNOSIS — D693 Immune thrombocytopenic purpura: Secondary | ICD-10-CM | POA: Diagnosis not present

## 2017-04-09 DIAGNOSIS — Z951 Presence of aortocoronary bypass graft: Secondary | ICD-10-CM | POA: Insufficient documentation

## 2017-04-09 DIAGNOSIS — I2510 Atherosclerotic heart disease of native coronary artery without angina pectoris: Secondary | ICD-10-CM | POA: Diagnosis not present

## 2017-04-09 DIAGNOSIS — I255 Ischemic cardiomyopathy: Secondary | ICD-10-CM | POA: Diagnosis not present

## 2017-04-09 DIAGNOSIS — M25811 Other specified joint disorders, right shoulder: Secondary | ICD-10-CM | POA: Insufficient documentation

## 2017-04-09 DIAGNOSIS — F419 Anxiety disorder, unspecified: Secondary | ICD-10-CM | POA: Diagnosis not present

## 2017-04-09 DIAGNOSIS — Z9861 Coronary angioplasty status: Secondary | ICD-10-CM | POA: Diagnosis not present

## 2017-04-09 DIAGNOSIS — Z96652 Presence of left artificial knee joint: Secondary | ICD-10-CM | POA: Insufficient documentation

## 2017-04-09 DIAGNOSIS — Z79899 Other long term (current) drug therapy: Secondary | ICD-10-CM | POA: Diagnosis not present

## 2017-04-09 DIAGNOSIS — Z888 Allergy status to other drugs, medicaments and biological substances status: Secondary | ICD-10-CM | POA: Diagnosis not present

## 2017-04-09 DIAGNOSIS — Z881 Allergy status to other antibiotic agents status: Secondary | ICD-10-CM | POA: Insufficient documentation

## 2017-04-09 DIAGNOSIS — M75111 Incomplete rotator cuff tear or rupture of right shoulder, not specified as traumatic: Secondary | ICD-10-CM | POA: Diagnosis not present

## 2017-04-09 DIAGNOSIS — E039 Hypothyroidism, unspecified: Secondary | ICD-10-CM | POA: Diagnosis not present

## 2017-04-09 DIAGNOSIS — Z96642 Presence of left artificial hip joint: Secondary | ICD-10-CM | POA: Insufficient documentation

## 2017-04-09 DIAGNOSIS — E7800 Pure hypercholesterolemia, unspecified: Secondary | ICD-10-CM | POA: Diagnosis not present

## 2017-04-09 DIAGNOSIS — F329 Major depressive disorder, single episode, unspecified: Secondary | ICD-10-CM | POA: Diagnosis not present

## 2017-04-09 DIAGNOSIS — Z01818 Encounter for other preprocedural examination: Secondary | ICD-10-CM

## 2017-04-09 DIAGNOSIS — I252 Old myocardial infarction: Secondary | ICD-10-CM | POA: Diagnosis not present

## 2017-04-09 DIAGNOSIS — I10 Essential (primary) hypertension: Secondary | ICD-10-CM | POA: Diagnosis not present

## 2017-04-09 DIAGNOSIS — K219 Gastro-esophageal reflux disease without esophagitis: Secondary | ICD-10-CM | POA: Insufficient documentation

## 2017-04-09 DIAGNOSIS — Z87891 Personal history of nicotine dependence: Secondary | ICD-10-CM | POA: Diagnosis not present

## 2017-04-09 DIAGNOSIS — Z7982 Long term (current) use of aspirin: Secondary | ICD-10-CM | POA: Insufficient documentation

## 2017-04-09 DIAGNOSIS — M75101 Unspecified rotator cuff tear or rupture of right shoulder, not specified as traumatic: Secondary | ICD-10-CM | POA: Diagnosis present

## 2017-04-09 HISTORY — PX: SHOULDER ARTHROSCOPY: SHX128

## 2017-04-09 LAB — CBC
HEMATOCRIT: 40.6 % (ref 39.0–52.0)
HEMOGLOBIN: 13.7 g/dL (ref 13.0–17.0)
MCH: 30 pg (ref 26.0–34.0)
MCHC: 33.7 g/dL (ref 30.0–36.0)
MCV: 88.8 fL (ref 78.0–100.0)
Platelets: 240 10*3/uL (ref 150–400)
RBC: 4.57 MIL/uL (ref 4.22–5.81)
RDW: 14.6 % (ref 11.5–15.5)
WBC: 9.1 10*3/uL (ref 4.0–10.5)

## 2017-04-09 LAB — BASIC METABOLIC PANEL
ANION GAP: 10 (ref 5–15)
BUN: 17 mg/dL (ref 6–20)
CHLORIDE: 108 mmol/L (ref 101–111)
CO2: 23 mmol/L (ref 22–32)
Calcium: 9.2 mg/dL (ref 8.9–10.3)
Creatinine, Ser: 1.29 mg/dL — ABNORMAL HIGH (ref 0.61–1.24)
GFR calc Af Amer: 59 mL/min — ABNORMAL LOW (ref >60)
GFR calc non Af Amer: 51 mL/min — ABNORMAL LOW (ref >60)
GLUCOSE: 103 mg/dL — AB (ref 65–99)
Potassium: 4.2 mmol/L (ref 3.5–5.1)
Sodium: 141 mmol/L (ref 135–145)

## 2017-04-09 SURGERY — ARTHROSCOPY, SHOULDER
Anesthesia: General | Site: Shoulder | Laterality: Right

## 2017-04-09 MED ORDER — EPINEPHRINE PF 1 MG/ML IJ SOLN
INTRAMUSCULAR | Status: AC
  Filled 2017-04-09: qty 2

## 2017-04-09 MED ORDER — FENTANYL CITRATE (PF) 100 MCG/2ML IJ SOLN
INTRAMUSCULAR | Status: AC
  Filled 2017-04-09: qty 2

## 2017-04-09 MED ORDER — BUPIVACAINE HCL (PF) 0.5 % IJ SOLN
INTRAMUSCULAR | Status: AC
  Filled 2017-04-09: qty 30

## 2017-04-09 MED ORDER — MEPERIDINE HCL 25 MG/ML IJ SOLN: 6.2500 mg | INTRAMUSCULAR | Status: DC | PRN

## 2017-04-09 MED ORDER — ROCURONIUM BROMIDE 10 MG/ML (PF) SYRINGE
PREFILLED_SYRINGE | INTRAVENOUS | Status: AC
  Filled 2017-04-09: qty 5

## 2017-04-09 MED ORDER — SUGAMMADEX SODIUM 200 MG/2ML IV SOLN
INTRAVENOUS | Status: AC
  Filled 2017-04-09: qty 2

## 2017-04-09 MED ORDER — SODIUM CHLORIDE 0.9 % IR SOLN
Status: DC | PRN
  Administered 2017-04-09: 6000 mL

## 2017-04-09 MED ORDER — MIDAZOLAM HCL 2 MG/2ML IJ SOLN: 2.0000 mg | Freq: Once | INTRAMUSCULAR | Status: DC

## 2017-04-09 MED ORDER — ONDANSETRON HCL 4 MG/2ML IJ SOLN: 4.0000 mg | Freq: Once | INTRAMUSCULAR | Status: DC | PRN

## 2017-04-09 MED ORDER — CHLORHEXIDINE GLUCONATE 4 % EX LIQD: 60.0000 mL | Freq: Once | CUTANEOUS | Status: DC

## 2017-04-09 MED ORDER — DEXTROSE 5 % IV SOLN
INTRAVENOUS | Status: DC | PRN
  Administered 2017-04-09: 50 ug/min via INTRAVENOUS

## 2017-04-09 MED ORDER — LACTATED RINGERS IV SOLN: INTRAVENOUS | Status: DC

## 2017-04-09 MED ORDER — ROCURONIUM BROMIDE 100 MG/10ML IV SOLN
INTRAVENOUS | Status: DC | PRN
  Administered 2017-04-09: 30 mg via INTRAVENOUS

## 2017-04-09 MED ORDER — MIDAZOLAM HCL 2 MG/2ML IJ SOLN
INTRAMUSCULAR | Status: AC
  Administered 2017-04-09: 2 mg
  Filled 2017-04-09: qty 2

## 2017-04-09 MED ORDER — CEFAZOLIN SODIUM-DEXTROSE 2-4 GM/100ML-% IV SOLN
2.0000 g | INTRAVENOUS | Status: AC
  Administered 2017-04-09: 2 g via INTRAVENOUS
  Filled 2017-04-09: qty 100

## 2017-04-09 MED ORDER — FENTANYL CITRATE (PF) 250 MCG/5ML IJ SOLN
INTRAMUSCULAR | Status: AC
  Filled 2017-04-09: qty 5

## 2017-04-09 MED ORDER — FENTANYL CITRATE (PF) 100 MCG/2ML IJ SOLN
50.0000 ug | Freq: Once | INTRAMUSCULAR | Status: AC
  Administered 2017-04-09: 50 ug via INTRAVENOUS

## 2017-04-09 MED ORDER — PROPOFOL 10 MG/ML IV BOLUS
INTRAVENOUS | Status: DC | PRN
  Administered 2017-04-09: 200 mg via INTRAVENOUS

## 2017-04-09 MED ORDER — HYDROCODONE-ACETAMINOPHEN 5-325 MG PO TABS: 1.0000 | ORAL_TABLET | ORAL | 0 refills | Status: DC | PRN

## 2017-04-09 MED ORDER — ONDANSETRON HCL 4 MG/2ML IJ SOLN
INTRAMUSCULAR | Status: DC | PRN
  Administered 2017-04-09: 4 mg via INTRAVENOUS

## 2017-04-09 MED ORDER — LIDOCAINE HCL (CARDIAC) 20 MG/ML IV SOLN
INTRAVENOUS | Status: DC | PRN
  Administered 2017-04-09: 20 mg via INTRAVENOUS

## 2017-04-09 MED ORDER — STERILE WATER FOR IRRIGATION IR SOLN
Status: DC | PRN
  Administered 2017-04-09: 1000 mL

## 2017-04-09 MED ORDER — SUGAMMADEX SODIUM 200 MG/2ML IV SOLN
INTRAVENOUS | Status: DC | PRN
  Administered 2017-04-09: 200 mg via INTRAVENOUS

## 2017-04-09 MED ORDER — LACTATED RINGERS IV SOLN
INTRAVENOUS | Status: DC
  Administered 2017-04-09: 12:00:00 via INTRAVENOUS

## 2017-04-09 MED ORDER — CEFAZOLIN SODIUM-DEXTROSE 2-4 GM/100ML-% IV SOLN
INTRAVENOUS | Status: AC
  Filled 2017-04-09: qty 100

## 2017-04-09 MED ORDER — LIDOCAINE 2% (20 MG/ML) 5 ML SYRINGE
INTRAMUSCULAR | Status: AC
  Filled 2017-04-09: qty 5

## 2017-04-09 MED ORDER — HYDROMORPHONE HCL 1 MG/ML IJ SOLN: 0.2500 mg | INTRAMUSCULAR | Status: DC | PRN

## 2017-04-09 MED ORDER — BUPIVACAINE-EPINEPHRINE (PF) 0.5% -1:200000 IJ SOLN
INTRAMUSCULAR | Status: DC | PRN
  Administered 2017-04-09: 30 mL via PERINEURAL

## 2017-04-09 MED ORDER — PROPOFOL 10 MG/ML IV BOLUS
INTRAVENOUS | Status: AC
  Filled 2017-04-09: qty 20

## 2017-04-09 SURGICAL SUPPLY — 45 items
BLADE GREAT WHITE 4.2 (BLADE) ×2 IMPLANT
BLADE GREAT WHITE 4.2MM (BLADE) ×1
BUR VERTEX HOODED 4.5 (BURR) ×3 IMPLANT
CANNULA SHOULDER 7CM (CANNULA) ×3 IMPLANT
CANNULA TWIST IN 8.25X7CM (CANNULA) ×3 IMPLANT
COVER SURGICAL LIGHT HANDLE (MISCELLANEOUS) IMPLANT
DECANTER SPIKE VIAL GLASS SM (MISCELLANEOUS) ×3 IMPLANT
DRAPE ORTHO SPLIT 77X108 STRL (DRAPES) ×4
DRAPE STERI 35X30 U-POUCH (DRAPES) ×3 IMPLANT
DRAPE SURG ORHT 6 SPLT 77X108 (DRAPES) ×2 IMPLANT
DRAPE U-SHAPE 47X51 STRL (DRAPES) ×3 IMPLANT
DRSG EMULSION OIL 3X3 NADH (GAUZE/BANDAGES/DRESSINGS) ×3 IMPLANT
DRSG PAD ABDOMINAL 8X10 ST (GAUZE/BANDAGES/DRESSINGS) ×3 IMPLANT
DURAPREP 26ML APPLICATOR (WOUND CARE) ×3 IMPLANT
GAUZE SPONGE 4X4 12PLY STRL (GAUZE/BANDAGES/DRESSINGS) ×3 IMPLANT
GAUZE SPONGE 4X4 12PLY STRL LF (GAUZE/BANDAGES/DRESSINGS) ×3 IMPLANT
GLOVE BIO SURGEON STRL SZ8 (GLOVE) ×3 IMPLANT
GLOVE BIOGEL PI IND STRL 8 (GLOVE) ×2 IMPLANT
GLOVE BIOGEL PI INDICATOR 8 (GLOVE) ×4
GLOVE SS N UNI LF 8.0 STRL (GLOVE) ×3 IMPLANT
GOWN STRL REUS W/ TWL LRG LVL3 (GOWN DISPOSABLE) ×1 IMPLANT
GOWN STRL REUS W/ TWL XL LVL3 (GOWN DISPOSABLE) ×2 IMPLANT
GOWN STRL REUS W/TWL LRG LVL3 (GOWN DISPOSABLE) ×2
GOWN STRL REUS W/TWL XL LVL3 (GOWN DISPOSABLE) ×4
KIT BASIN OR (CUSTOM PROCEDURE TRAY) ×3 IMPLANT
KIT ROOM TURNOVER OR (KITS) ×3 IMPLANT
MANIFOLD NEPTUNE II (INSTRUMENTS) ×3 IMPLANT
NEEDLE 22X1 1/2 (OR ONLY) (NEEDLE) IMPLANT
NS IRRIG 1000ML POUR BTL (IV SOLUTION) IMPLANT
PACK ARTHROSCOPY DSU (CUSTOM PROCEDURE TRAY) ×3 IMPLANT
PAD ABD 8X10 STRL (GAUZE/BANDAGES/DRESSINGS) ×9 IMPLANT
PAD ARMBOARD 7.5X6 YLW CONV (MISCELLANEOUS) ×6 IMPLANT
SET ARTHROSCOPY TUBING (MISCELLANEOUS) ×4
SET ARTHROSCOPY TUBING LN (MISCELLANEOUS) ×2 IMPLANT
SLING ARM XL FOAM STRAP (SOFTGOODS) ×3 IMPLANT
SPONGE LAP 4X18 X RAY DECT (DISPOSABLE) ×3 IMPLANT
SUT ETHILON 3 0 PS 1 (SUTURE) ×3 IMPLANT
SYR CONTROL 10ML LL (SYRINGE) IMPLANT
TAPE CLOTH SURG 6X10 WHT LF (GAUZE/BANDAGES/DRESSINGS) ×3 IMPLANT
TOWEL OR 17X24 6PK STRL BLUE (TOWEL DISPOSABLE) ×3 IMPLANT
TOWEL OR 17X26 10 PK STRL BLUE (TOWEL DISPOSABLE) ×3 IMPLANT
TUBE CONNECTING 12'X1/4 (SUCTIONS) ×1
TUBE CONNECTING 12X1/4 (SUCTIONS) ×2 IMPLANT
WAND HAND CNTRL MULTIVAC 90 (MISCELLANEOUS) ×3 IMPLANT
WATER STERILE IRR 1000ML POUR (IV SOLUTION) ×3 IMPLANT

## 2017-04-09 NOTE — Op Note (Signed)
#  [id number] 

## 2017-04-09 NOTE — Progress Notes (Signed)
Heart rate 55 Beta blocker not given today. States he took a dose last night.

## 2017-04-09 NOTE — Anesthesia Preprocedure Evaluation (Addendum)
Anesthesia Evaluation  Patient identified by MRN, date of birth, ID band Patient awake    Reviewed: Allergy & Precautions, NPO status , Patient's Chart, lab work & pertinent test results  Airway Mallampati: I  TM Distance: >3 FB Neck ROM: Full    Dental  (+) Poor Dentition,    Pulmonary former smoker,    Pulmonary exam normal        Cardiovascular hypertension, + CAD, + Past MI, + Cardiac Stents and + CABG  Normal cardiovascular exam     Neuro/Psych    GI/Hepatic GERD  Medicated and Controlled,  Endo/Other    Renal/GU      Musculoskeletal   Abdominal   Peds  Hematology   Anesthesia Other Findings   Reproductive/Obstetrics                           Anesthesia Physical Anesthesia Plan  ASA: III  Anesthesia Plan: General   Post-op Pain Management:    Induction: Intravenous  Airway Management Planned: Oral ETT  Additional Equipment:   Intra-op Plan:   Post-operative Plan: Extubation in OR  Informed Consent: I have reviewed the patients History and Physical, chart, labs and discussed the procedure including the risks, benefits and alternatives for the proposed anesthesia with the patient or authorized representative who has indicated his/her understanding and acceptance.     Plan Discussed with: CRNA and Surgeon  Anesthesia Plan Comments:         Anesthesia Quick Evaluation

## 2017-04-09 NOTE — Anesthesia Postprocedure Evaluation (Signed)
Anesthesia Post Note  Patient: Jeffrey [name]  Procedure(s) Performed: Procedure(s) (LRB): ARTHROSCOPY SHOULDER (Right)  Patient location during evaluation: PACU Anesthesia Type: General Level of consciousness: awake and alert Pain management: pain level controlled Vital Signs Assessment: post-procedure vital signs reviewed and stable Respiratory status: spontaneous breathing, nonlabored ventilation, respiratory function stable and patient connected to nasal cannula oxygen Cardiovascular status: blood pressure returned to baseline and stable Postop Assessment: no signs of nausea or vomiting Anesthetic complications: no       Last Vitals:  Vitals:   04/09/17 1457 04/09/17 1510  BP: (!) 161/79 (!) 154/78  Pulse: 61 (!) 59  Resp: 17 15  Temp:  36.1 C    Last Pain:  Vitals:   04/09/17 1510  TempSrc:   PainSc: 0-No pain                 [name] DAVID

## 2017-04-09 NOTE — Transfer of Care (Signed)
Immediate Anesthesia Transfer of Care Note  Patient: Jeffrey [name]  Procedure(s) Performed: Procedure(s): ARTHROSCOPY SHOULDER (Right)  Patient Location: PACU  Anesthesia Type:GA combined with regional for post-op pain  Level of Consciousness: awake, alert  and oriented  Airway & Oxygen Therapy: Patient Spontanous Breathing and Patient connected to nasal cannula oxygen  Post-op Assessment: Report given to RN, Post -op Vital signs reviewed and stable and Patient moving all extremities  Post vital signs: Reviewed and stable  Last Vitals:  Vitals:   04/09/17 1235 04/09/17 1427  BP: (!) 142/67   Pulse: 62   Resp: 16   Temp:  (P) 36.3 C    Last Pain:  Vitals:   04/09/17 1146  TempSrc: Oral      Patients Stated Pain Goal: 0 (24/17/53 0104)  Complications: No apparent anesthesia complications

## 2017-04-09 NOTE — Anesthesia Procedure Notes (Signed)
Anesthesia Regional Block: Interscalene brachial plexus block   Pre-Anesthetic Checklist: ,, timeout performed, Correct Patient, Correct Site, Correct Laterality, Correct Procedure, Correct Position, site marked, Risks and benefits discussed,  Surgical consent,  Pre-op evaluation,  At surgeon's request and post-op pain management  Laterality: Right  Prep: chloraprep       Needles:  Injection technique: Single-shot  Needle Type: Echogenic Stimulator Needle     Needle Length: 5cm  Needle Gauge: 21     Additional Needles:   Procedures: ultrasound guided, nerve stimulator,,,,,,   Nerve Stimulator or Paresthesia:  Response: 0.4 mA,   Additional Responses:   Narrative:  Start time: 04/09/2017 12:15 PM End time: 04/09/2017 12:23 PM Injection made incrementally with aspirations every 5 mL.  Additional Notes: Monitors applied. Patient sedated. Sterile prep and drape,hand hygiene and sterile gloves were used. Relevant anatomy identified.Needle position confirmed.Local anesthetic injected incrementally after negative aspiration. Local anesthetic spread visualized around nerve(s). Vascular puncture avoided. No complications. Image printed for medical record.The patient tolerated the procedure well.

## 2017-04-09 NOTE — Anesthesia Procedure Notes (Signed)
Procedure Name: Intubation Date/Time: 04/09/2017 1:21 PM Performed by: Rush Farmer E Pre-anesthesia Checklist: Patient identified, Emergency Drugs available, Suction available and Patient being monitored Patient Re-evaluated:Patient Re-evaluated prior to inductionOxygen Delivery Method: Circle system utilized Preoxygenation: Pre-oxygenation with 100% oxygen Intubation Type: IV induction Ventilation: Mask ventilation without difficulty and Oral airway inserted - appropriate to patient size Laryngoscope Size: Mac and 4 Grade View: Grade II Tube size: 7.5 mm Number of attempts: 1 Airway Equipment and Method: Stylet Placement Confirmation: ETT inserted through vocal cords under direct vision,  positive ETCO2 and breath sounds checked- equal and bilateral Secured at: 22 cm Tube secured with: Tape Dental Injury: Teeth and Oropharynx as per pre-operative assessment

## 2017-04-09 NOTE — Interval H&P Note (Signed)
History and Physical Interval Note:  04/09/2017 1:05 PM  Jeffrey [name]  has presented today for surgery, with the diagnosis of RIGHT SHOULDER IMPINGEMENT, ROTATOR CUFF TEAR  The various methods of treatment have been discussed with the patient and family. After consideration of risks, benefits and other options for treatment, the patient has consented to  Procedure(s): ARTHROSCOPY SHOULDER (Right) as a surgical intervention .  The patient's history has been reviewed, patient examined, no change in status, stable for surgery.  I have reviewed the patient's chart and labs.  Questions were answered to the patient's satisfaction.     [name] G

## 2017-04-10 ENCOUNTER — Encounter (HOSPITAL_COMMUNITY): Payer: Self-pay | Admitting: Orthopaedic Surgery

## 2017-04-10 NOTE — Op Note (Signed)
NAME:  Jeffrey [name], Jeffrey [name]                     ACCOUNT NO.:  MEDICAL RECORD NO.:  [id number]  LOCATION:                                 FACILITY:  PHYSICIAN:  Monico Blitz. Rhona Raider, M.D.     DATE OF BIRTH:  DATE OF PROCEDURE:  04/09/2017 DATE OF DISCHARGE:                              OPERATIVE REPORT   PREOPERATIVE DIAGNOSIS: 1. Right shoulder impingement. 2. Right shoulder rotator cuff tear.  POSTOPERATIVE DIAGNOSIS: 1. Right shoulder impingement. 2. Right shoulder rotator cuff tear.  PROCEDURE: 1. Right shoulder arthroscopic acromioplasty. 2. Right shoulder arthroscopic debridement.  ANESTHESIA:  General and block.  ATTENDING SURGEON:  Monico Blitz. Rhona Raider, M.D.  ASSISTANT:  Loni Dolly, P.A.  INDICATION FOR PROCEDURE:  The patient is an 83 year old man with a long history of right shoulder pain.  This has persisted despite injections and therapy and time.  By MRI scan, he has a very prominent subacromial morphology, along with a fairly large retracted rotator cuff tear.  He has offered arthroscopy for acromioplasty and debridement.  The patient elects not to have this repaired.  Informed operative consent was obtained after discussion of possible complications including reaction to anesthesia and infection.  Case was scheduled for the main operating room due to some cardiac concerns.  SUMMARY OF FINDINGS AND PROCEDURE:  Under general anesthesia and a block, a right shoulder arthroscopy was performed.  Glenohumeral joint showed some mild-to-moderate degenerative change.  There were no loose bodies.  The biceps tendon appeared intact, which sublux slightly.  The subscapularis was intact.  He did have some thinning of his rotator cuff with some tissues still attached, but this was consistent with a slightly retracted cuff tear.  The tissues were poor and I think repair would have been suboptimal.  He did have a very prominent subacromial morphology addressed with an acromioplasty.   He also had a prominence over the [facility] joint, which we decompressed.  He was discharged home the same day.  DESCRIPTION OF PROCEDURE:  The patient was taken to the operating suite where general anesthetic was applied without difficulty.  He was also given a block in the pre-anesthesia area.  He was positioned in a beach- chair position and prepped and draped in normal sterile fashion.  After the administration of preop IV Kefzol and an appropriate time out, an arthroscopy of the right shoulder was performed through total of 2 portals.  Findings were as noted above.  Procedure consisted predominantly of the acromioplasty, which was done with a bur in the lateral position followed by transfer of the bur to the posterior position.  I then performed resection of the undersurface of the clavicle and debridement of the rotator cuff.  We elected not to repair as was his wish.  The shoulder was thoroughly irrigated followed by reapproximation of portals loosely with nylon.  Adaptic was applied followed by dry gauze and tape.  Estimated blood loss and intraoperative fluids obtained from anesthesia records.  DISPOSITION:  The patient was extubated in the operating room and taken to recovery in stable condition.  Plans were for him to go home same-day and follow up in the office  closely.  I will contact him by phone tonight.     Monico Blitz Rhona Raider, M.D.     PGD/MEDQ  D:  04/09/2017  T:  04/09/2017  Job:  [id number]

## 2018-06-09 ENCOUNTER — Other Ambulatory Visit: Payer: Self-pay | Admitting: Orthopaedic Surgery

## 2018-06-16 ENCOUNTER — Encounter (HOSPITAL_COMMUNITY): Payer: Self-pay

## 2018-06-16 NOTE — Pre-Procedure Instructions (Signed)
NYSHAWN GOWDY  06/16/2018      [place], [place] [address] Phone: [phone] Fax: [phone]  [facility] [place] [address] [place] [address] Phone: [phone] Fax: [phone]  [id number] [place], [address] [place] [address] Phone: [phone] Fax: [phone]    Your procedure is scheduled on 06/29/2018.  Report to [facility] at 0800 A.M.  Call this number if you have problems the morning of surgery:  [phone]   Remember:  Do not eat or drink after midnight the night before your surgery.   Continue all medications as directed by your physician except follow these medication instructions before surgery below     Take these medicines the morning of surgery with A SIP OF WATER: Alprazolam (Xanax) - if needed Fluticasone-Salmeterol (Advair)  7 days prior to surgery STOP taking any Aspirin(unless otherwise instructed by your surgeon), Aleve, Naproxen, Ibuprofen, Motrin, Advil, Goody's, BC's, all herbal medications, fish oil, and all vitamins  Follow your doctors instructions regarding your Aspirin.  If no instructions were given by your doctor, then you will need to call your surgeon's office to get instructions.       Do not wear jewelry  Do not wear lotions, powders, or colognes, or deodorant.  Men may shave face and neck.  Do not bring valuables to the hospital.  [facility] is not responsible for any belongings or valuables.  Hearing aids, eyeglasses, contacts, dentures or bridgework may not be worn into surgery.  Leave your suitcase in the car.  After surgery it may be brought to your room.  For patients admitted to the hospital, discharge time will be determined by your treatment  team.  Patients discharged the day of surgery will not be allowed to drive home.   Name and phone number of your driver:    Special instructions:   [facility]- Preparing For Surgery  Before surgery, you can play an important role. Because skin is not sterile, your skin needs to be as free of germs as possible. You can reduce the number of germs on your skin by washing with CHG (chlorahexidine gluconate) Soap before surgery.  CHG is an antiseptic cleaner which kills germs and bonds with the skin to continue killing germs even after washing.    Oral Hygiene is also important to reduce your risk of infection.  Remember - BRUSH YOUR TEETH THE MORNING OF SURGERY WITH YOUR REGULAR TOOTHPASTE  Please do not use if you have an allergy to CHG or antibacterial soaps. If your skin becomes reddened/irritated stop using the CHG.  Do not shave (including legs and underarms) for at least 48 hours prior to first CHG shower. It is OK to shave your face.  Please follow these instructions carefully.   1. Shower the NIGHT BEFORE SURGERY and the MORNING OF SURGERY with CHG.   2. If you chose to wash your hair, wash your hair first as usual with your normal shampoo.  3. After you shampoo, rinse your hair and body thoroughly to remove the shampoo.  4. Use CHG as you would any other liquid soap. You can apply CHG directly to the skin and wash gently with a scrungie or a clean washcloth.  5. Apply the CHG Soap to your body ONLY FROM THE NECK DOWN.  Do not use on open wounds or open sores. Avoid contact with your eyes, ears, mouth and genitals (private parts). Wash Face and genitals (private parts)  with your normal soap.  6. Wash thoroughly, paying special attention to the area where your surgery will be performed.  7. Thoroughly rinse your body with warm water from the neck down.  8. DO NOT shower/wash with your normal soap after using and rinsing off the CHG Soap.  9. Pat yourself dry with a CLEAN  TOWEL.  10. Wear CLEAN PAJAMAS to bed the night before surgery, wear comfortable clothes the morning of surgery  11. Place CLEAN SHEETS on your bed the night of your first shower and DO NOT SLEEP WITH PETS.    Day of Surgery: Shower as stated above. Do not apply any deodorants/lotions.  Please wear clean clothes to the hospital/surgery center.   Remember to brush your teeth WITH YOUR REGULAR TOOTHPASTE.    Please read over the following fact sheets that you were given.

## 2018-06-17 ENCOUNTER — Other Ambulatory Visit: Payer: Self-pay

## 2018-06-17 ENCOUNTER — Encounter (HOSPITAL_COMMUNITY)
Admission: RE | Admit: 2018-06-17 | Discharge: 2018-06-17 | Disposition: A | Discharge status: Home / Self Care | Payer: Medicare Other | Source: Ambulatory Visit | Attending: Orthopaedic Surgery | Admitting: Orthopaedic Surgery

## 2018-06-17 ENCOUNTER — Encounter (HOSPITAL_COMMUNITY): Payer: Self-pay

## 2018-06-17 ENCOUNTER — Ambulatory Visit (HOSPITAL_COMMUNITY)
Admission: RE | Admit: 2018-06-17 | Discharge: 2018-06-17 | Disposition: A | Discharge status: Home / Self Care | Payer: Medicare Other | Source: Ambulatory Visit | Attending: Orthopaedic Surgery | Admitting: Orthopaedic Surgery

## 2018-06-17 DIAGNOSIS — Z01818 Encounter for other preprocedural examination: Secondary | ICD-10-CM | POA: Diagnosis present

## 2018-06-17 DIAGNOSIS — Z01812 Encounter for preprocedural laboratory examination: Secondary | ICD-10-CM | POA: Insufficient documentation

## 2018-06-17 DIAGNOSIS — I700 Atherosclerosis of aorta: Secondary | ICD-10-CM | POA: Diagnosis not present

## 2018-06-17 DIAGNOSIS — Z951 Presence of aortocoronary bypass graft: Secondary | ICD-10-CM | POA: Insufficient documentation

## 2018-06-17 HISTORY — DX: Sleep apnea, unspecified: G47.30

## 2018-06-17 HISTORY — DX: Presence of external hearing-aid: Z97.4

## 2018-06-17 HISTORY — DX: Presence of spectacles and contact lenses: Z97.3

## 2018-06-17 HISTORY — DX: Presence of dental prosthetic device (complete) (partial): Z97.2

## 2018-06-17 LAB — CBC WITH DIFFERENTIAL/PLATELET
ABS IMMATURE GRANULOCYTES: 0 10*3/uL (ref 0.0–0.1)
BASOS ABS: 0.1 10*3/uL (ref 0.0–0.1)
BASOS PCT: 1 %
Eosinophils Absolute: 0.6 10*3/uL (ref 0.0–0.7)
Eosinophils Relative: 6 %
HCT: 43 % (ref 39.0–52.0)
HEMOGLOBIN: 13.8 g/dL (ref 13.0–17.0)
Immature Granulocytes: 0 %
LYMPHS PCT: 32 %
Lymphs Abs: 3 10*3/uL (ref 0.7–4.0)
MCH: 30.5 pg (ref 26.0–34.0)
MCHC: 32.1 g/dL (ref 30.0–36.0)
MCV: 94.9 fL (ref 78.0–100.0)
MONO ABS: 1.1 10*3/uL — AB (ref 0.1–1.0)
Monocytes Relative: 12 %
NEUTROS ABS: 4.4 10*3/uL (ref 1.7–7.7)
Neutrophils Relative %: 49 %
Platelets: 285 10*3/uL (ref 150–400)
RBC: 4.53 MIL/uL (ref 4.22–5.81)
RDW: 13.6 % (ref 11.5–15.5)
WBC: 9.2 10*3/uL (ref 4.0–10.5)

## 2018-06-17 LAB — TYPE AND SCREEN
ABO/RH(D): A POS
Antibody Screen: NEGATIVE

## 2018-06-17 LAB — BASIC METABOLIC PANEL
ANION GAP: 8 (ref 5–15)
BUN: 19 mg/dL (ref 8–23)
CO2: 25 mmol/L (ref 22–32)
Calcium: 9.4 mg/dL (ref 8.9–10.3)
Chloride: 107 mmol/L (ref 98–111)
Creatinine, Ser: 1.36 mg/dL — ABNORMAL HIGH (ref 0.61–1.24)
GFR, EST AFRICAN AMERICAN: 55 mL/min — AB (ref >60)
GFR, EST NON AFRICAN AMERICAN: 47 mL/min — AB (ref >60)
Glucose, Bld: 115 mg/dL — ABNORMAL HIGH (ref 70–99)
POTASSIUM: 4.2 mmol/L (ref 3.5–5.1)
SODIUM: 140 mmol/L (ref 135–145)

## 2018-06-17 LAB — URINALYSIS, ROUTINE W REFLEX MICROSCOPIC
Bilirubin Urine: NEGATIVE
GLUCOSE, UA: NEGATIVE mg/dL
HGB URINE DIPSTICK: NEGATIVE
KETONES UR: NEGATIVE mg/dL
LEUKOCYTES UA: NEGATIVE
Nitrite: NEGATIVE
PROTEIN: NEGATIVE mg/dL
Specific Gravity, Urine: 1.02 (ref 1.005–1.030)
pH: 5 (ref 5.0–8.0)

## 2018-06-17 LAB — SURGICAL PCR SCREEN
MRSA, PCR: NEGATIVE
Staphylococcus aureus: NEGATIVE

## 2018-06-17 LAB — PROTIME-INR
INR: 0.95
Prothrombin Time: 12.6 seconds (ref 11.4–15.2)

## 2018-06-17 LAB — APTT: APTT: 33 s (ref 24–36)

## 2018-06-17 NOTE — Progress Notes (Signed)
Pt denies any acute cardiopulmonary issues. Pt under the care of Dr. Vernell Barrier, Cardiology in [place], [place]. Pt stated that he is scheduled for a stress test with Dr. Sabra Heck on 06/21/18 " for clearance."  Requested EKG, LOV note,  future stress test results and any cardiac studies from Dr. Sabra Heck. Pt denies having a chest x ray within the last year. Pt denies recent labs. Left voice message with Juliann Pulse, Surgical Coordinator, to make MD aware that pt had not been provided with pre-op instructions regarding 162 mg of Aspirin taken daily; requested that surgeon follow up with pt.  Pt chart forwarded to anesthesia for review.

## 2018-06-18 NOTE — Progress Notes (Signed)
Anesthesia Chart Review:   Case:  [id number] Date/Time:  06/29/18 0950   Procedure:  TOTAL HIP ARTHROPLASTY ANTERIOR APPROACH (Right )   Anesthesia type:  Spinal   Pre-op diagnosis:  RIGHT HIP OSTEOARTHRITIS   Location:  [facility] OR ROOM 05 / [facility]   Surgeon:  Melrose Nakayama, MD      DISCUSSION: - Pt is an 84 year old male with hx  CAD (s/p CABG 2011; DES to CX 2012; DES to LAD 2013), ischemic cardiomyopathy, aortic stenosis (mild by 01/19/18 echo), ITP, HTN, OSA  - Pt reports he had a reaction to a blood transfusion in the past and can only receive irradiated blood products.   - Pt has cardiac clearance for surgery   VS: BP (!) 146/73   Pulse 73   Temp 36.4 C   Resp 20   Ht 6' (1.829 m)   Wt 232 lb 3.2 oz (105.3 kg)   SpO2 95%   BMI 31.49 kg/m    PROVIDERS: PCP is [name], MD Cardiologist is Orpah Greek, MD who cleared pt for surgery Hematologist is Dr. Gaynelle Arabian in [place], [place]: Labs reviewed: Acceptable for surgery. (all labs ordered are listed, but only abnormal results are displayed)  Labs Reviewed  BASIC METABOLIC PANEL - Abnormal; Notable for the following components:      Result Value   Glucose, Bld 115 (*)    Creatinine, Ser 1.36 (*)    GFR calc non Af Amer 47 (*)    GFR calc Af Amer 55 (*)    All other components within normal limits  CBC WITH DIFFERENTIAL/PLATELET - Abnormal; Notable for the following components:   Monocytes Absolute 1.1 (*)    All other components within normal limits  URINALYSIS, ROUTINE W REFLEX MICROSCOPIC - Abnormal; Notable for the following components:   Color, Urine AMBER (*)    APPearance HAZY (*)    All other components within normal limits  SURGICAL PCR SCREEN  APTT  PROTIME-INR  TYPE AND SCREEN     IMAGES:  CXR 06/17/18: No active disease.  Previous CABG.  Aortic atherosclerosis   EKG: requested from Dr. Ammie Ferrier office.    CV:  Nuclear stress test 06/21/18 (Dr. Ammie Ferrier office):  1. No reversible ischemia or  perfusion defect.  2. EF 40%. Global hypokinesis.  3. Low risk scan.  4. No change from 2017 stress test  Echo 01/19/18 (Dr. Ammie Ferrier office): - Normal LV systolic function. EF 55-60% - Normal RV function - Normal LV wall thickness - Stage 1 diastolic dysfunction and impaired LV relaxation pattern.  - Cardiac chamber imaging demonstrates mild biventricular enlargement.  - Valvular and doppler imaging demonstrates mild aortic insufficiency, mild aortic stenosis, mild mitral regurgitation, and mild pulmonic insufficiency - Normal estimated PA systolic pressure - No pericardial effusion - No other significant findings   05/15/2016 [place] Stress test (Dr. Ammie Ferrier office): "large fixed inferolateral defect with some peri-infarct ischemia. 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 Ammie Ferrier office): Moderate LVH, LA mildly dilated, grade 1 diastolic dysfunction, interior basilar lateral hypokinesia, mild AI, MR and PI, mild aortic stenosis. EF 50-55%  Cardiac cath on 07/18/2013 [facility]; scanned under Media tab, Correspondence 06/22/14):  - RA pressure mildly elevated at 7 mmHg. RV pressure mildly elevated at 30/10 mmHg. PA pressure mildly elevated at 30/10 with a mean of 20 mmHg. PCWP mildly elevated at 15 mmHg. CO 9.0 L/min. CI normal at 3.9 L/min2.  -  RCA non-dominant.  - LIMA to mid/distal LAD patent.  - LM 20-30%.  - Proximal LAD 95%, mid LAD 70-80%. Large DIAG with 60% ostial and 50% proximal stenosis.  - Proximal CX 10-20%. 40-50% mid CX. 30% distal CX. OM1 100% occluded with distal vessel filling via left to left collaterals. 40% OM2. PDA coming off of CX with 80% stenosis just prior to the touchdown of the known bypass graft.  - Known occlusion of SVG to OM and SVG to PDA. -  Medical therapy was recommended. Titrate b-blocker to try to increase competitive collateralization to the occluded OM vessel.    Past Medical History:  Diagnosis Date  . Anxiety    . Aortic stenosis    mild AS by 07/20/13 echo ([facility])  . Arthritis    "all over"  . Coronary artery disease   . Depression   . GERD (gastroesophageal reflux disease)   . H/O hiatal hernia   . Hearing aid worn    B/L  . High cholesterol   . History of blood transfusion reaction March 27, 2013   "he almost died; he has to get irradiated blood next time"  . Hypertension   . Hypothyroidism   . Ischemic cardiomyopathy   . ITP (idiopathic thrombocytopenic purpura)    Dr. Gaynelle Arabian, on [place]  . Myocardial infarction ([place]) Mar 27, 2010  . Pneumonia 1940's X 1; 03-27-2014 X 1  . Shortness of breath    with exertion, has not been very active  . Sleep apnea   . Wears glasses   . Wears partial dentures     Past Surgical History:  Procedure Laterality Date  . APPENDECTOMY    . BACK SURGERY    . CATARACT EXTRACTION, BILATERAL    . COLONOSCOPY    . CORONARY ANGIOPLASTY WITH STENT PLACEMENT     "think I have 3-4 stents" (06/22/2014)  . CORONARY ARTERY BYPASS GRAFT  03-27-2010   "CABG X3"  . HERNIA REPAIR    . HIATAL HERNIA REPAIR    . KNEE ARTHROSCOPY Left 06/22/2014   w/I&D  . KNEE ARTHROSCOPY Left 06/22/2014   Procedure: [place];  Surgeon: Hessie Dibble, MD;  Location: [facility];  Service: Orthopedics;  Laterality: Left;  . LUMBAR DISC SURGERY  1960's?  . MULTIPLE TOOTH EXTRACTIONS    . SHOULDER ARTHROSCOPY Right 04/09/2017   Procedure: ARTHROSCOPY SHOULDER;  Surgeon: Melrose Nakayama, MD;  Location: [facility];  Service: Orthopedics;  Laterality: Right;  . STERNAL CLOSURE     "wires from OHS taken out; plate put in" (4/0/1027)  . SYNOVECTOMY Left 08/17/2014   Procedure: SYNOVECTOMY LEFT KNEE;  Surgeon: Hessie Dibble, MD;  Location: [facility];  Service: Orthopedics;  Laterality: Left;  . TOTAL HIP ARTHROPLASTY Left   . TOTAL KNEE ARTHROPLASTY Left 11/24/2014   Procedure: TOTAL KNEE ARTHROPLASTY;  Surgeon: Hessie Dibble, MD;  Location: [facility];  Service:  Orthopedics;  Laterality: Left;    MEDICATIONS: . ALPRAZolam (XANAX) 0.25 MG tablet  . aspirin EC 81 MG tablet  . b complex vitamins tablet  . carvedilol (COREG) 6.25 MG tablet  . Cholecalciferol (VITAMIN D3) 2000 units capsule  . Fluticasone-Salmeterol (ADVAIR) 100-50 MCG/DOSE AEPB  . Homeopathic Products (LEG CRAMPS PM) SUBL  . levothyroxine (SYNTHROID, LEVOTHROID) 200 MCG tablet  . losartan (COZAAR) 50 MG tablet  . naproxen sodium (ALEVE) 220 MG tablet  . Omega-3 Fatty Acids (FISH OIL) 1200 MG CAPS  . omeprazole (PRILOSEC) 40 MG capsule  No current facility-administered medications for this encounter.     If no changes, I anticipate pt can proceed with surgery as scheduled.   Willeen Cass, FNP-BC [facility]Anesthesiology Phone: [phone] 06/28/2018 11:05 AM

## 2018-06-25 NOTE — H&P (Signed)
TOTAL HIP ADMISSION H&P  Patient is admitted for right total hip arthroplasty.  Subjective:  Chief Complaint: right hip pain  HPI: Jeffrey [name], 84 y.o. male, has a history of pain and functional disability in the right hip(s) due to arthritis and patient has failed non-surgical conservative treatments for greater than 12 weeks to include NSAID's and/or analgesics, corticosteriod injections, flexibility and strengthening excercises, use of assistive devices and weight reduction as appropriate.  Onset of symptoms was gradual starting 5 years ago with gradually worsening course since that time.The patient noted no past surgery on the right hip(s).  Patient currently rates pain in the right hip at 10 out of 10 with activity. Patient has night pain, worsening of pain with activity and weight bearing, trendelenberg gait, pain that interfers with activities of daily living and crepitus. Patient has evidence of subchondral cysts, subchondral sclerosis, periarticular osteophytes and joint space narrowing by imaging studies. This condition presents safety issues increasing the risk of falls. There is no current active infection.  Patient Active Problem List   Diagnosis Date Noted  . Primary osteoarthritis of left knee 11/24/2014  . Primary osteoarthritis of knee 11/24/2014  . Bacterial infection of knee joint ([place]) 08/17/2014  . Septic joint of left knee joint ([place]) 06/22/2014  . CAP (community acquired pneumonia) 01/26/2014  . CAD (coronary artery disease) of artery bypass graft 01/26/2014  . Unspecified hypothyroidism 01/26/2014  . Thrombocytopenia, unspecified ([place]) 01/26/2014  . Nausea alone 01/26/2014  . Effusion of knee joint, left 10/11/2012  . OA (osteoarthritis) of knee 10/11/2012  . HIP PAIN 11/15/2007  . 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 Anxiety   . Aortic stenosis    mild AS by 07/20/13 echo ([facility])  . Arthritis    "all over"  . Coronary artery disease   . Depression   . GERD (gastroesophageal reflux disease)   . H/O hiatal hernia   . Hearing aid worn    B/L  . High cholesterol   . History of blood transfusion reaction 2013-03-30   "he almost died; he has to get irradiated blood next time"  . Hypertension   . Hypothyroidism   . Ischemic cardiomyopathy   . ITP (idiopathic thrombocytopenic purpura)    Dr. Gaynelle Arabian, on [place]  . Myocardial infarction ([place]) 03/30/2010  . Pneumonia 1940's X 1; March 30, 2014 X 1  . Shortness of breath    with exertion, has not been very active  . Sleep apnea   . Wears glasses   . Wears partial dentures     Past Surgical History:  Procedure Laterality Date  . APPENDECTOMY    . BACK SURGERY    . CATARACT EXTRACTION, BILATERAL    . COLONOSCOPY    . CORONARY ANGIOPLASTY WITH STENT PLACEMENT     "think I have 3-4 stents" (06/22/2014)  . CORONARY ARTERY BYPASS GRAFT  03/30/10   "CABG X3"  . HERNIA REPAIR    . HIATAL HERNIA REPAIR    . KNEE ARTHROSCOPY Left 06/22/2014   w/I&D  . KNEE ARTHROSCOPY Left 06/22/2014   Procedure: [place];  Surgeon: Hessie Dibble, MD;  Location: [facility];  Service: Orthopedics;  Laterality: Left;  . LUMBAR DISC SURGERY  1960's?  . MULTIPLE TOOTH EXTRACTIONS    . SHOULDER ARTHROSCOPY Right 04/09/2017   Procedure: ARTHROSCOPY SHOULDER;  Surgeon: Melrose Nakayama, MD;  Location: [facility];  Service: Orthopedics;  Laterality: Right;  . STERNAL CLOSURE     "wires from OHS taken out; plate put in" (12/27/9676)  . SYNOVECTOMY Left 08/17/2014   Procedure: SYNOVECTOMY LEFT KNEE;  Surgeon: Hessie Dibble, MD;  Location: [place];  Service: Orthopedics;  Laterality: Left;  . TOTAL HIP ARTHROPLASTY Left   . TOTAL KNEE ARTHROPLASTY Left 11/24/2014   Procedure: TOTAL KNEE ARTHROPLASTY;  Surgeon: Hessie Dibble, MD;  Location: [place];  Service: Orthopedics;  Laterality: Left;    No current facility-administered  medications for this encounter.    Current Outpatient Medications  Medication Sig Dispense Refill Last Dose  . ALPRAZolam (XANAX) 0.25 MG tablet Take 1 tablet (0.25 mg total) by mouth 3 (three) times daily as needed for anxiety. 12 tablet 0 More than a month at Unknown time  . aspirin EC 81 MG tablet Take 162 mg by mouth at bedtime.    04/08/2017 at Unknown time  . b complex vitamins tablet Take 1 tablet by mouth at bedtime.     . carvedilol (COREG) 6.25 MG tablet Take 12.5 mg by mouth at bedtime.    04/08/2017 at Unknown time  . Cholecalciferol (VITAMIN D3) 2000 units capsule Take 2,000 Units by mouth at bedtime.  3   . Fluticasone-Salmeterol (ADVAIR) 100-50 MCG/DOSE AEPB Inhale 1 puff into the lungs daily.     . Homeopathic Products (LEG CRAMPS PM) SUBL Place 1 tablet under the tongue at bedtime.     [name] Kitchen levothyroxine (SYNTHROID, LEVOTHROID) 200 MCG tablet Take 200 mcg by mouth at bedtime.    04/08/2017 at Unknown time  . losartan (COZAAR) 50 MG tablet Take 50 mg by mouth at bedtime.  8   . naproxen sodium (ALEVE) 220 MG tablet Take 220 mg by mouth at bedtime.     . Omega-3 Fatty Acids (FISH OIL) 1200 MG CAPS Take 1,200 mg by mouth at bedtime. 360 MG OMEGA-3     . omeprazole (PRILOSEC) 40 MG capsule Take 40 mg by mouth at bedtime.    04/08/2017 at Unknown time   Allergies  Allergen Reactions  . Lipitor [Atorvastatin] Other (See Comments)    muscle spasms cramps  . Methylprednisolone Other (See Comments)    cramps cramps  . Other     If patient is to receive blood - blood must be treated witgh radiation because his body will not accept a normal infusion   . Doxycycline Rash    Doxycycline caused itching redness on scalp and head Doxycycline caused itching redness on scalp and head    Social History   Tobacco Use  . Smoking status: Former Smoker    Packs/day: 1.00    Years: 15.00    Pack years: 15.00    Types: Cigarettes  . Smokeless tobacco: Former Systems developer    Types: Chew  . Tobacco  comment: "quit smoking ~ late ~ 60's; quit chewing in the 1970's"  Substance Use Topics  . Alcohol use: No    Family History  Problem Relation Age of Onset  . Heart disease Unknown   . Arthritis Unknown      Review of Systems  Musculoskeletal:       Right hip  All other systems reviewed and are negative.   Objective:  Physical Exam  Constitutional: He is oriented to person, place, and time. He appears well-developed and well-nourished.  HENT:  Head: Normocephalic and atraumatic.  Eyes: Pupils are equal, round, and reactive to light.  Neck: Normal range of motion.  Cardiovascular:  Normal rate and regular rhythm.  Respiratory: Effort normal.  GI: Soft.  Musculoskeletal:  Examination right hip shows significantly limited range of motion.  He has severe range pain with internal range of motion testing.  He has 5-5 strength normal sensation throughout both lower extremities.  He is neurovascularly intact distally.  Neurological: He is alert and oriented to person, place, and time.  Skin: Skin is warm and dry.  Psychiatric: He has a normal mood and affect. His behavior is normal. Judgment and thought content normal.    Vital signs in last 24 hours:    Labs:   Estimated body mass index is 31.49 kg/m as calculated from the following:   Height as of 06/17/18: 6' (1.829 m).   Weight as of 06/17/18: 105.3 kg (232 lb 3.2 oz).   Imaging Review Plain radiographs demonstrate severe degenerative joint disease of the right hip(s). The bone quality appears to be good for age and reported activity level.    Preoperative templating of the joint replacement has been completed, documented, and submitted to the Operating Room personnel in order to optimize intra-operative equipment management.     Assessment/Plan:  End stage primary arthritis, right hip(s)  The patient history, physical examination, clinical judgement of the provider and imaging studies are consistent with end  stage degenerative joint disease of the right hip(s) and total hip arthroplasty is deemed medically necessary. The treatment options including medical management, injection therapy, arthroscopy and arthroplasty were discussed at length. The risks and benefits of total hip arthroplasty were presented and reviewed. The risks due to aseptic loosening, infection, stiffness, dislocation/subluxation,  thromboembolic complications and other imponderables were discussed.  The patient acknowledged the explanation, agreed to proceed with the plan and consent was signed. Patient is being admitted for inpatient treatment for surgery, pain control, PT, OT, prophylactic antibiotics, VTE prophylaxis, progressive ambulation and ADL's and discharge planning.The patient is planning to be discharged home with home health services

## 2018-06-28 MED ORDER — VANCOMYCIN HCL 10 G IV SOLR
1500.0000 mg | INTRAVENOUS | Status: AC
  Administered 2018-06-29: 1500 mg via INTRAVENOUS
  Filled 2018-06-28 (×2): qty 1500

## 2018-06-28 MED ORDER — TRANEXAMIC ACID 1000 MG/10ML IV SOLN
1000.0000 mg | INTRAVENOUS | Status: AC
  Administered 2018-06-29: 1000 mg via INTRAVENOUS
  Filled 2018-06-28: qty 10
  Filled 2018-06-28: qty 1100

## 2018-06-28 MED ORDER — BUPIVACAINE LIPOSOME 1.3 % IJ SUSP
20.0000 mL | INTRAMUSCULAR | Status: DC
  Filled 2018-06-28 (×2): qty 20

## 2018-06-28 MED ORDER — TRANEXAMIC ACID 1000 MG/10ML IV SOLN
2000.0000 mg | INTRAVENOUS | Status: DC
  Filled 2018-06-28 (×2): qty 20

## 2018-06-29 ENCOUNTER — Inpatient Hospital Stay (HOSPITAL_COMMUNITY): Payer: Medicare Other

## 2018-06-29 ENCOUNTER — Inpatient Hospital Stay (HOSPITAL_COMMUNITY): Payer: Medicare Other | Admitting: Physician Assistant

## 2018-06-29 ENCOUNTER — Encounter (HOSPITAL_COMMUNITY): Payer: Self-pay | Admitting: Surgery

## 2018-06-29 ENCOUNTER — Encounter (HOSPITAL_COMMUNITY)
Admission: RE | Disposition: A | Discharge status: Home Health | Payer: Self-pay | Source: Home / Self Care | Attending: Orthopaedic Surgery

## 2018-06-29 ENCOUNTER — Inpatient Hospital Stay (HOSPITAL_COMMUNITY)
Admission: RE | Admit: 2018-06-29 | Discharge: 2018-07-02 | DRG: 470 | Disposition: A | Discharge status: Home Health | Payer: Medicare Other | Attending: Orthopaedic Surgery | Admitting: Orthopaedic Surgery

## 2018-06-29 ENCOUNTER — Other Ambulatory Visit: Payer: Self-pay

## 2018-06-29 ENCOUNTER — Inpatient Hospital Stay (HOSPITAL_COMMUNITY): Payer: Medicare Other | Admitting: Certified Registered Nurse Anesthetist

## 2018-06-29 DIAGNOSIS — Z9842 Cataract extraction status, left eye: Secondary | ICD-10-CM | POA: Diagnosis not present

## 2018-06-29 DIAGNOSIS — E039 Hypothyroidism, unspecified: Secondary | ICD-10-CM | POA: Diagnosis present

## 2018-06-29 DIAGNOSIS — Z974 Presence of external hearing-aid: Secondary | ICD-10-CM

## 2018-06-29 DIAGNOSIS — Z7982 Long term (current) use of aspirin: Secondary | ICD-10-CM | POA: Diagnosis not present

## 2018-06-29 DIAGNOSIS — Z7951 Long term (current) use of inhaled steroids: Secondary | ICD-10-CM | POA: Diagnosis not present

## 2018-06-29 DIAGNOSIS — I10 Essential (primary) hypertension: Secondary | ICD-10-CM | POA: Diagnosis present

## 2018-06-29 DIAGNOSIS — Z888 Allergy status to other drugs, medicaments and biological substances status: Secondary | ICD-10-CM

## 2018-06-29 DIAGNOSIS — Z951 Presence of aortocoronary bypass graft: Secondary | ICD-10-CM | POA: Diagnosis not present

## 2018-06-29 DIAGNOSIS — F419 Anxiety disorder, unspecified: Secondary | ICD-10-CM | POA: Diagnosis present

## 2018-06-29 DIAGNOSIS — J449 Chronic obstructive pulmonary disease, unspecified: Secondary | ICD-10-CM | POA: Diagnosis present

## 2018-06-29 DIAGNOSIS — I255 Ischemic cardiomyopathy: Secondary | ICD-10-CM | POA: Diagnosis present

## 2018-06-29 DIAGNOSIS — M1651 Unilateral post-traumatic osteoarthritis, right hip: Secondary | ICD-10-CM | POA: Diagnosis present

## 2018-06-29 DIAGNOSIS — Z6831 Body mass index (BMI) 31.0-31.9, adult: Secondary | ICD-10-CM | POA: Diagnosis not present

## 2018-06-29 DIAGNOSIS — I2510 Atherosclerotic heart disease of native coronary artery without angina pectoris: Secondary | ICD-10-CM | POA: Diagnosis present

## 2018-06-29 DIAGNOSIS — Z9181 History of falling: Secondary | ICD-10-CM | POA: Diagnosis not present

## 2018-06-29 DIAGNOSIS — M1611 Unilateral primary osteoarthritis, right hip: Secondary | ICD-10-CM | POA: Diagnosis present

## 2018-06-29 DIAGNOSIS — H9193 Unspecified hearing loss, bilateral: Secondary | ICD-10-CM | POA: Diagnosis present

## 2018-06-29 DIAGNOSIS — Z8701 Personal history of pneumonia (recurrent): Secondary | ICD-10-CM

## 2018-06-29 DIAGNOSIS — K219 Gastro-esophageal reflux disease without esophagitis: Secondary | ICD-10-CM | POA: Diagnosis present

## 2018-06-29 DIAGNOSIS — Z419 Encounter for procedure for purposes other than remedying health state, unspecified: Secondary | ICD-10-CM

## 2018-06-29 DIAGNOSIS — Z9841 Cataract extraction status, right eye: Secondary | ICD-10-CM

## 2018-06-29 DIAGNOSIS — E669 Obesity, unspecified: Secondary | ICD-10-CM | POA: Diagnosis present

## 2018-06-29 DIAGNOSIS — Z87891 Personal history of nicotine dependence: Secondary | ICD-10-CM | POA: Diagnosis not present

## 2018-06-29 DIAGNOSIS — K08409 Partial loss of teeth, unspecified cause, unspecified class: Secondary | ICD-10-CM | POA: Diagnosis present

## 2018-06-29 DIAGNOSIS — Z79899 Other long term (current) drug therapy: Secondary | ICD-10-CM

## 2018-06-29 DIAGNOSIS — Z79890 Hormone replacement therapy (postmenopausal): Secondary | ICD-10-CM | POA: Diagnosis not present

## 2018-06-29 DIAGNOSIS — Z96642 Presence of left artificial hip joint: Secondary | ICD-10-CM | POA: Diagnosis present

## 2018-06-29 DIAGNOSIS — Z955 Presence of coronary angioplasty implant and graft: Secondary | ICD-10-CM

## 2018-06-29 DIAGNOSIS — I252 Old myocardial infarction: Secondary | ICD-10-CM | POA: Diagnosis not present

## 2018-06-29 DIAGNOSIS — Z96652 Presence of left artificial knee joint: Secondary | ICD-10-CM | POA: Diagnosis present

## 2018-06-29 HISTORY — PX: TOTAL HIP ARTHROPLASTY: SHX124

## 2018-06-29 LAB — ABO/RH: ABO/RH(D): A POS

## 2018-06-29 LAB — TYPE AND SCREEN
ABO/RH(D): A POS
Antibody Screen: NEGATIVE

## 2018-06-29 SURGERY — ARTHROPLASTY, HIP, TOTAL, ANTERIOR APPROACH
Anesthesia: Spinal | Site: Hip | Laterality: Right

## 2018-06-29 MED ORDER — LACTATED RINGERS IV SOLN
INTRAVENOUS | Status: DC
  Administered 2018-06-30 (×2): via INTRAVENOUS

## 2018-06-29 MED ORDER — ACETAMINOPHEN 325 MG PO TABS
325.0000 mg | ORAL_TABLET | Freq: Four times a day (QID) | ORAL | Status: DC | PRN
  Administered 2018-07-01: 650 mg via ORAL
  Filled 2018-06-29: qty 2

## 2018-06-29 MED ORDER — METOCLOPRAMIDE HCL 5 MG/ML IJ SOLN
5.0000 mg | Freq: Three times a day (TID) | INTRAMUSCULAR | Status: DC | PRN
  Filled 2018-06-29: qty 2

## 2018-06-29 MED ORDER — KETOROLAC TROMETHAMINE 15 MG/ML IJ SOLN
7.5000 mg | Freq: Four times a day (QID) | INTRAMUSCULAR | Status: AC
  Administered 2018-06-29 – 2018-06-30 (×4): 7.5 mg via INTRAVENOUS
  Filled 2018-06-29 (×4): qty 1

## 2018-06-29 MED ORDER — PHENOL 1.4 % MT LIQD: 1.0000 | OROMUCOSAL | Status: DC | PRN

## 2018-06-29 MED ORDER — CEFAZOLIN SODIUM-DEXTROSE 2-4 GM/100ML-% IV SOLN
2.0000 g | Freq: Four times a day (QID) | INTRAVENOUS | Status: AC
  Administered 2018-06-29 (×2): 2 g via INTRAVENOUS
  Filled 2018-06-29 (×2): qty 100

## 2018-06-29 MED ORDER — LEVOTHYROXINE SODIUM 100 MCG PO TABS
200.0000 ug | ORAL_TABLET | Freq: Every day | ORAL | Status: DC
  Administered 2018-06-29 – 2018-07-01 (×3): 200 ug via ORAL
  Filled 2018-06-29 (×3): qty 2

## 2018-06-29 MED ORDER — ACETAMINOPHEN 500 MG PO TABS
500.0000 mg | ORAL_TABLET | Freq: Four times a day (QID) | ORAL | Status: AC
  Administered 2018-06-29 – 2018-06-30 (×3): 500 mg via ORAL
  Filled 2018-06-29 (×3): qty 1

## 2018-06-29 MED ORDER — PROPOFOL 10 MG/ML IV BOLUS
INTRAVENOUS | Status: AC
  Filled 2018-06-29: qty 20

## 2018-06-29 MED ORDER — SODIUM CHLORIDE 0.9 % IV SOLN
INTRAVENOUS | Status: DC | PRN
  Administered 2018-06-29: 25 ug/min via INTRAVENOUS

## 2018-06-29 MED ORDER — MENTHOL 3 MG MT LOZG: 1.0000 | LOZENGE | OROMUCOSAL | Status: DC | PRN

## 2018-06-29 MED ORDER — CEFAZOLIN SODIUM-DEXTROSE 2-4 GM/100ML-% IV SOLN
2.0000 g | INTRAVENOUS | Status: AC
  Administered 2018-06-29: 2 g via INTRAVENOUS
  Filled 2018-06-29: qty 100

## 2018-06-29 MED ORDER — BUPIVACAINE-EPINEPHRINE (PF) 0.25% -1:200000 IJ SOLN
INTRAMUSCULAR | Status: AC
  Filled 2018-06-29: qty 30

## 2018-06-29 MED ORDER — OXYCODONE HCL 5 MG PO TABS: 5.0000 mg | ORAL_TABLET | Freq: Once | ORAL | Status: DC | PRN

## 2018-06-29 MED ORDER — METOCLOPRAMIDE HCL 5 MG PO TABS: 5.0000 mg | ORAL_TABLET | Freq: Three times a day (TID) | ORAL | Status: DC | PRN

## 2018-06-29 MED ORDER — PROPOFOL 500 MG/50ML IV EMUL
INTRAVENOUS | Status: DC | PRN
  Administered 2018-06-29: 50 ug/kg/min via INTRAVENOUS

## 2018-06-29 MED ORDER — LOSARTAN POTASSIUM 50 MG PO TABS
50.0000 mg | ORAL_TABLET | Freq: Every day | ORAL | Status: DC
  Administered 2018-06-29 – 2018-07-01 (×3): 50 mg via ORAL
  Filled 2018-06-29 (×3): qty 1

## 2018-06-29 MED ORDER — BISACODYL 5 MG PO TBEC: 5.0000 mg | DELAYED_RELEASE_TABLET | Freq: Every day | ORAL | Status: DC | PRN

## 2018-06-29 MED ORDER — ONDANSETRON HCL 4 MG/2ML IJ SOLN
INTRAMUSCULAR | Status: AC
  Filled 2018-06-29: qty 2

## 2018-06-29 MED ORDER — MORPHINE SULFATE (PF) 2 MG/ML IV SOLN
0.5000 mg | INTRAVENOUS | Status: DC | PRN
  Administered 2018-06-29 – 2018-06-30 (×2): 1 mg via INTRAVENOUS
  Filled 2018-06-29 (×2): qty 1

## 2018-06-29 MED ORDER — HYDROCODONE-ACETAMINOPHEN 5-325 MG PO TABS
1.0000 | ORAL_TABLET | ORAL | Status: DC | PRN
  Administered 2018-06-29 – 2018-06-30 (×4): 2 via ORAL
  Filled 2018-06-29 (×4): qty 2

## 2018-06-29 MED ORDER — 0.9 % SODIUM CHLORIDE (POUR BTL) OPTIME
TOPICAL | Status: DC | PRN
  Administered 2018-06-29: 1000 mL

## 2018-06-29 MED ORDER — BUPIVACAINE IN DEXTROSE 0.75-8.25 % IT SOLN
INTRATHECAL | Status: DC | PRN
  Administered 2018-06-29: 2 mL via INTRATHECAL

## 2018-06-29 MED ORDER — OXYCODONE HCL 5 MG/5ML PO SOLN
5.0000 mg | Freq: Once | ORAL | Status: DC | PRN
  Filled 2018-06-29: qty 5

## 2018-06-29 MED ORDER — BUPIVACAINE LIPOSOME 1.3 % IJ SUSP
INTRAMUSCULAR | Status: DC | PRN
  Administered 2018-06-29: 20 mL

## 2018-06-29 MED ORDER — DOCUSATE SODIUM 100 MG PO CAPS
100.0000 mg | ORAL_CAPSULE | Freq: Two times a day (BID) | ORAL | Status: DC
  Administered 2018-06-29 – 2018-07-02 (×6): 100 mg via ORAL
  Filled 2018-06-29 (×6): qty 1

## 2018-06-29 MED ORDER — PHENYLEPHRINE HCL 10 MG/ML IJ SOLN
INTRAMUSCULAR | Status: AC
  Filled 2018-06-29: qty 1

## 2018-06-29 MED ORDER — ASPIRIN EC 325 MG PO TBEC
325.0000 mg | DELAYED_RELEASE_TABLET | Freq: Two times a day (BID) | ORAL | Status: DC
  Administered 2018-06-30 – 2018-07-02 (×5): 325 mg via ORAL
  Filled 2018-06-29 (×5): qty 1

## 2018-06-29 MED ORDER — PROPOFOL 10 MG/ML IV BOLUS
INTRAVENOUS | Status: DC | PRN
  Administered 2018-06-29 (×2): 10 mg via INTRAVENOUS

## 2018-06-29 MED ORDER — PROMETHAZINE HCL 25 MG/ML IJ SOLN: 6.2500 mg | INTRAMUSCULAR | Status: DC | PRN

## 2018-06-29 MED ORDER — BUPIVACAINE-EPINEPHRINE (PF) 0.25% -1:200000 IJ SOLN
INTRAMUSCULAR | Status: DC | PRN
  Administered 2018-06-29: 30 mL

## 2018-06-29 MED ORDER — DIPHENHYDRAMINE HCL 12.5 MG/5ML PO ELIX: 12.5000 mg | ORAL_SOLUTION | ORAL | Status: DC | PRN

## 2018-06-29 MED ORDER — ONDANSETRON HCL 4 MG/2ML IJ SOLN
INTRAMUSCULAR | Status: DC | PRN
  Administered 2018-06-29: 4 mg via INTRAVENOUS

## 2018-06-29 MED ORDER — ONDANSETRON HCL 4 MG/2ML IJ SOLN
4.0000 mg | Freq: Four times a day (QID) | INTRAMUSCULAR | Status: DC | PRN
  Administered 2018-06-30 (×2): 4 mg via INTRAVENOUS
  Filled 2018-06-29 (×2): qty 2

## 2018-06-29 MED ORDER — LACTATED RINGERS IV SOLN
INTRAVENOUS | Status: DC
  Administered 2018-06-29 (×2): via INTRAVENOUS

## 2018-06-29 MED ORDER — ALUM & MAG HYDROXIDE-SIMETH 200-200-20 MG/5ML PO SUSP: 30.0000 mL | ORAL | Status: DC | PRN

## 2018-06-29 MED ORDER — PANTOPRAZOLE SODIUM 40 MG PO TBEC
80.0000 mg | DELAYED_RELEASE_TABLET | Freq: Every day | ORAL | Status: DC
  Administered 2018-06-29 – 2018-07-02 (×4): 80 mg via ORAL
  Filled 2018-06-29 (×4): qty 2

## 2018-06-29 MED ORDER — METHOCARBAMOL 1000 MG/10ML IJ SOLN
500.0000 mg | Freq: Four times a day (QID) | INTRAVENOUS | Status: DC | PRN
  Administered 2018-06-29: 500 mg via INTRAVENOUS
  Filled 2018-06-29: qty 550

## 2018-06-29 MED ORDER — CARVEDILOL 12.5 MG PO TABS
12.5000 mg | ORAL_TABLET | Freq: Every day | ORAL | Status: DC
  Administered 2018-06-29 – 2018-07-01 (×3): 12.5 mg via ORAL
  Filled 2018-06-29 (×3): qty 1

## 2018-06-29 MED ORDER — CHLORHEXIDINE GLUCONATE 4 % EX LIQD: 60.0000 mL | Freq: Once | CUTANEOUS | Status: DC

## 2018-06-29 MED ORDER — ONDANSETRON HCL 4 MG PO TABS: 4.0000 mg | ORAL_TABLET | Freq: Four times a day (QID) | ORAL | Status: DC | PRN

## 2018-06-29 MED ORDER — ALPRAZOLAM 0.25 MG PO TABS
0.2500 mg | ORAL_TABLET | Freq: Three times a day (TID) | ORAL | Status: DC | PRN
  Administered 2018-06-30 – 2018-07-01 (×3): 0.25 mg via ORAL
  Filled 2018-06-29 (×4): qty 1

## 2018-06-29 MED ORDER — PROPOFOL 10 MG/ML IV BOLUS
INTRAVENOUS | Status: AC
  Filled 2018-06-29: qty 40

## 2018-06-29 MED ORDER — HYDROMORPHONE HCL 1 MG/ML IJ SOLN: 0.2500 mg | INTRAMUSCULAR | Status: DC | PRN

## 2018-06-29 MED ORDER — METHOCARBAMOL 500 MG PO TABS
500.0000 mg | ORAL_TABLET | Freq: Four times a day (QID) | ORAL | Status: DC | PRN
  Administered 2018-06-29 – 2018-07-02 (×6): 500 mg via ORAL
  Filled 2018-06-29 (×6): qty 1

## 2018-06-29 MED ORDER — HYDROCODONE-ACETAMINOPHEN 7.5-325 MG PO TABS
1.0000 | ORAL_TABLET | ORAL | Status: DC | PRN
  Administered 2018-06-30: 2 via ORAL
  Administered 2018-06-30: 1 via ORAL
  Administered 2018-06-30 – 2018-07-01 (×3): 2 via ORAL
  Filled 2018-06-29 (×5): qty 2

## 2018-06-29 MED ORDER — TRANEXAMIC ACID 1000 MG/10ML IV SOLN
1000.0000 mg | Freq: Once | INTRAVENOUS | Status: AC
  Administered 2018-06-29: 1000 mg via INTRAVENOUS
  Filled 2018-06-29: qty 1100

## 2018-06-29 MED ORDER — FLUTICASONE FUROATE-VILANTEROL 100-25 MCG/INH IN AEPB
1.0000 | INHALATION_SPRAY | Freq: Every day | RESPIRATORY_TRACT | Status: DC
  Administered 2018-06-30 – 2018-07-02 (×2): 1 via RESPIRATORY_TRACT
  Filled 2018-06-29: qty 28

## 2018-06-29 SURGICAL SUPPLY — 40 items
BLADE SAW SGTL 18X1.27X75 (BLADE) ×2 IMPLANT
BLADE SAW SGTL 18X1.27X75MM (BLADE) ×1
CAPT HIP TOTAL 2 ×3 IMPLANT
CELLS DAT CNTRL 66122 CELL SVR (MISCELLANEOUS) ×1 IMPLANT
COVER PERINEAL POST (MISCELLANEOUS) ×3 IMPLANT
COVER SURGICAL LIGHT HANDLE (MISCELLANEOUS) ×3 IMPLANT
DRAPE C-ARM 42X120 X-RAY (DRAPES) ×3 IMPLANT
DRAPE STERI IOBAN 125X83 (DRAPES) ×3 IMPLANT
DRAPE U-SHAPE 47X51 STRL (DRAPES) ×6 IMPLANT
DRSG AQUACEL AG ADV 3.5X10 (GAUZE/BANDAGES/DRESSINGS) ×3 IMPLANT
DRSG OPSITE POSTOP 4X6 (GAUZE/BANDAGES/DRESSINGS) ×3 IMPLANT
DURAPREP 26ML APPLICATOR (WOUND CARE) ×3 IMPLANT
ELECT BLADE 4.0 EZ CLEAN MEGAD (MISCELLANEOUS) ×3
ELECT REM PT RETURN 9FT ADLT (ELECTROSURGICAL) ×3
ELECTRODE BLDE 4.0 EZ CLN MEGD (MISCELLANEOUS) ×1 IMPLANT
ELECTRODE REM PT RTRN 9FT ADLT (ELECTROSURGICAL) ×1 IMPLANT
FACESHIELD WRAPAROUND (MASK) ×6 IMPLANT
GLOVE BIO SURGEON STRL SZ8 (GLOVE) ×6 IMPLANT
GLOVE BIOGEL PI IND STRL 8 (GLOVE) ×2 IMPLANT
GLOVE BIOGEL PI INDICATOR 8 (GLOVE) ×4
GOWN STRL REUS W/ TWL LRG LVL3 (GOWN DISPOSABLE) ×1 IMPLANT
GOWN STRL REUS W/ TWL XL LVL3 (GOWN DISPOSABLE) ×2 IMPLANT
GOWN STRL REUS W/TWL LRG LVL3 (GOWN DISPOSABLE) ×2
GOWN STRL REUS W/TWL XL LVL3 (GOWN DISPOSABLE) ×4
KIT BASIN OR (CUSTOM PROCEDURE TRAY) ×3 IMPLANT
MANIFOLD NEPTUNE II (INSTRUMENTS) ×3 IMPLANT
NEEDLE HYPO 22GX1.5 SAFETY (NEEDLE) ×3 IMPLANT
NS IRRIG 1000ML POUR BTL (IV SOLUTION) ×3 IMPLANT
PACK TOTAL JOINT (CUSTOM PROCEDURE TRAY) ×3 IMPLANT
POSITIONER SURGICAL ARM (MISCELLANEOUS) ×3 IMPLANT
RTRCTR WOUND ALEXIS 18CM MED (MISCELLANEOUS) ×3
SUT ETHIBOND NAB CT1 #1 30IN (SUTURE) ×6 IMPLANT
SUT VIC AB 1 CT1 27 (SUTURE) ×2
SUT VIC AB 1 CT1 27XBRD ANBCTR (SUTURE) ×1 IMPLANT
SUT VIC AB 2-0 CT1 27 (SUTURE) ×2
SUT VIC AB 2-0 CT1 TAPERPNT 27 (SUTURE) ×1 IMPLANT
SUT VIC AB 3-0 PS2 18 (SUTURE) ×2
SUT VIC AB 3-0 PS2 18XBRD (SUTURE) ×1 IMPLANT
SUT VLOC 180 0 24IN GS25 (SUTURE) ×3 IMPLANT
SYR 50ML LL SCALE MARK (SYRINGE) ×3 IMPLANT

## 2018-06-29 NOTE — Op Note (Signed)
PRE-OP DIAGNOSIS:  RIGHT HIP DEGENERATIVE JOINT DISEASE POST-OP DIAGNOSIS:  same PROCEDURE: RIGHT TOTAL HIP ARTHROPLASTY ANTERIOR APPROACH ANESTHESIA:  Spinal and MAC SURGEON:  Melrose Nakayama MD ASSISTANT:  Loni Dolly PA-C   INDICATIONS FOR PROCEDURE:  The patient is a 84 y.o. male with a long history of a painful hip.  This has persisted despite multiple conservative measures.  The patient has persisted with pain and dysfunction making rest and activity difficult.  A total hip replacement is offered as surgical treatment.  Informed operative consent was obtained after discussion of possible complications including reaction to anesthesia, infection, neurovascular injury, dislocation, DVT, PE, and death.  The importance of the postoperative rehab program to optimize result was stressed with the patient.  SUMMARY OF FINDINGS AND PROCEDURE:  Under the above anesthesia through a anterior approach an the Hana table a right THR was performed.  The patient had severe degenerative change and excellent bone quality.  We used DePuy components to replace the hip and these were size 7 high offset Actisl femur capped with a -2 60m metal hip ball.  On the acetabular side we used a size 56 Gription shell with a  plus 0 neutral polyethylene liner.  We did use a hole eliminator.  ALoni DollyPA-C assisted throughout and was invaluable to the completion of the case in that he helped position and retract while I performed the procedure.  He also closed simultaneously to help minimize OR time.  I used fluoroscopy throughout the case to check position of implants and leg lengths and read all of these views myself.  DESCRIPTION OF PROCEDURE:  The patient was taken to the OR suite where the above anesthetic was applied.  The patient was then positioned on the Hana table supine.  All bony prominences were appropriately padded.  Prep and drape was then performed in normal sterile fashion.  The patient was given kefzol and  vancomycin preoperative antibiotic and an appropriate time out was performed.  We then took an anterior approach to the right hip.  Dissection was taken through adipose to the tensor fascia lata fascia.  This structure was incised longitudinally and we dissected in the intermuscular interval just medial to this muscle.  Cobra retractors were placed superior and inferior to the femoral neck superficial to the capsule.  A capsular incision was then made and the retractors were placed along the femoral neck.  Xray was brought in to get a good level for the femoral neck cut which was made with an oscillating saw and osteotome.  The femoral head was removed with a corkscrew.  The acetabulum was exposed and some labral tissues were excised. Reaming was taken to the inside wall of the pelvis and sequentially up to 1 mm smaller than the actual component.  A trial of components was done and then the aforementioned acetabular shell was placed in appropriate tilt and anteversion confirmed by fluoroscopy. The liner was placed along with the hole eliminator and attention was turned to the femur.  The leg was brought down and over into adduction and the elevator bar was used to raise the femur up gently in the wound.  The piriformis was released with care taken to preserve the obturator internus attachment and all of the posterior capsule. The femur was reamed and then broached to the appropriate size.  A trial reduction was done and the aforementioned head and neck assembly gave uKoreathe best stability in extension with external rotation.  Leg lengths were felt  to be about equal by fluoroscopic exam.  The trial components were removed and the wound irrigated.  We then placed the femoral component in appropriate anteversion.  The head was applied to a dry stem neck and the hip again reduced.  It was again stable in the aforementioned position.  The would was irrigated again followed by re-approximation of anterior capsule with  ethibond suture. Tensor fascia was repaired with V-loc suture  followed by deep closure with #O and #2 undyed vicryl.  Skin was closed with subQ stitch and steristrips followed by a sterile dressing.  EBL and IOF can be obtained from anesthesia records.  DISPOSITION:  The patient was extubated in the OR and taken to PACU in stable condition to be admitted to the Orthopedic Surgery for appropriate post-op care to include perioperative antibiotics and DVT prophylaxis.

## 2018-06-29 NOTE — Evaluation (Signed)
Physical Therapy Evaluation Patient Details Name: Jeffrey [name] MRN: [id number] DOB: [birth year] Today's Date: 06/29/2018   History of Present Illness  Pt is an 84 y/o male s/p elective R THA, direct anterior. PMH includes CAD, HTN, MI, ischemic cardiomyopathy, OSA, L THA, and L TKA.   Clinical Impression  Pt is s/p surgery above with deficits below. Pt requiring min to min guard A for mobility using RW. Pt with increased pain during mobility, therefore distance limited. Will continue to follow acutely to maximize functional mobility independence and safety.     Follow Up Recommendations Follow surgeon's recommendation for DC plan and follow-up therapies;Supervision for mobility/OOB    Equipment Recommendations  None recommended by PT    Recommendations for Other Services OT consult     Precautions / Restrictions Precautions Precautions: None Restrictions Weight Bearing Restrictions: Yes RLE Weight Bearing: Weight bearing as tolerated      Mobility  Bed Mobility Overal bed mobility: Needs Assistance Bed Mobility: Supine to Sit     Supine to sit: Supervision     General bed mobility comments: Supervision for safety. Increased time required.   Transfers Overall transfer level: Needs assistance Equipment used: Rolling walker (2 wheeled) Transfers: Sit to/from Stand Sit to Stand: Min assist         General transfer comment: Min A for lift assist and steadying. Verbal cues for safe hand placement.   Ambulation/Gait Ambulation/Gait assistance: Min guard Gait Distance (Feet): 15 Feet Assistive device: Rolling walker (2 wheeled) Gait Pattern/deviations: Step-to pattern;Decreased step length - right;Decreased step length - left;Decreased weight shift to right;Antalgic Gait velocity: Decreased    General Gait Details: Slow, antalgic gait. Ambulated to bathroom and then to chair. Required cues for sequencing using RW.   Stairs            Wheelchair Mobility     Modified Rankin (Stroke Patients Only)       Balance Overall balance assessment: Needs assistance Sitting-balance support: No upper extremity supported;Feet supported Sitting balance-Leahy Scale: Fair     Standing balance support: Bilateral upper extremity supported;During functional activity Standing balance-Leahy Scale: Poor Standing balance comment: Reliant on BUE support.                              Pertinent Vitals/Pain Pain Assessment: 0-10 Pain Score: 10-Worst pain ever(with movement ) Pain Location: R hip  Pain Descriptors / Indicators: Aching;Operative site guarding Pain Intervention(s): Limited activity within patient's tolerance;Monitored during session;Repositioned    Home Living Family/patient expects to be discharged to:: Private residence Living Arrangements: Spouse/significant other Available Help at Discharge: Family;Available 24 hours/day Type of Home: House Home Access: Stairs to enter Entrance Stairs-Rails: Left Entrance Stairs-Number of Steps: 5 Home Layout: Two level;Able to live on main level with bedroom/bathroom Home Equipment: Other (comment);Walker - 2 wheels;Bedside commode;Cane - single point;Crutches;Cane - quad(lift chair )      Prior Function Level of Independence: Independent with assistive device(s)         Comments: Used cane for ambulation      Hand Dominance   Dominant Hand: Right    Extremity/Trunk Assessment   Upper Extremity Assessment Upper Extremity Assessment: Defer to OT evaluation    Lower Extremity Assessment Lower Extremity Assessment: RLE deficits/detail RLE Deficits / Details: Deficits consistent with post op pain and weakness. Sensory in tact.     Cervical / Trunk Assessment Cervical / Trunk Assessment: Normal  Communication   Communication:  No difficulties  Cognition Arousal/Alertness: Awake/alert Behavior During Therapy: WFL for tasks assessed/performed Overall Cognitive Status: Within  Functional Limits for tasks assessed                                        General Comments General comments (skin integrity, edema, etc.): Pt wife and son present during session. Wife asking about obtaining a leg lifter prior to d/c.     Exercises     Assessment/Plan    PT Assessment Patient needs continued PT services  PT Problem List Decreased strength;Decreased range of motion;Decreased balance;Decreased mobility;Decreased knowledge of use of DME;Decreased knowledge of precautions;Pain       PT Treatment Interventions DME instruction;Gait training;Stair training;Functional mobility training;Therapeutic activities;Therapeutic exercise;Balance training;Patient/family education    PT Goals (Current goals can be found in the Care Plan section)  Acute Rehab PT Goals Patient Stated Goal: to decrease pain  PT Goal Formulation: With patient Time For Goal Achievement: 07/13/18 Potential to Achieve Goals: Good    Frequency 7X/week   Barriers to discharge        Co-evaluation               AM-PAC PT "6 Clicks" Daily Activity  Outcome Measure Difficulty turning over in bed (including adjusting bedclothes, sheets and blankets)?: A Little Difficulty moving from lying on back to sitting on the side of the bed? : A Little Difficulty sitting down on and standing up from a chair with arms (e.g., wheelchair, bedside commode, etc,.)?: Unable Help needed moving to and from a bed to chair (including a wheelchair)?: A Little Help needed walking in hospital room?: A Little Help needed climbing 3-5 steps with a railing? : A Lot 6 Click Score: 15    End of Session Equipment Utilized During Treatment: Gait belt Activity Tolerance: Patient tolerated treatment well Patient left: in chair;with call bell/phone within reach;with family/visitor present Nurse Communication: Mobility status PT Visit Diagnosis: Other abnormalities of gait and mobility (R26.89);Pain Pain -  Right/Left: Right Pain - part of body: Hip    Time: 4680-3212 PT Time Calculation (min) (ACUTE ONLY): 26 min   Charges:   PT Evaluation $PT Eval Low Complexity: 1 Low PT Treatments $Therapeutic Activity: 8-22 mins   PT G Codes:        Leighton Ruff, PT, DPT  Acute Rehabilitation Services  Pager: [phone]   Rudean Hitt 06/29/2018, 7:06 PM

## 2018-06-29 NOTE — Interval H&P Note (Signed)
History and Physical Interval Note:  06/29/2018 9:15 AM  Jeffrey [name]  has presented today for surgery, with the diagnosis of RIGHT HIP OSTEOARTHRITIS  The various methods of treatment have been discussed with the patient and family. After consideration of risks, benefits and other options for treatment, the patient has consented to  Procedure(s): RIGHT TOTAL HIP ARTHROPLASTY ANTERIOR APPROACH (Right) as a surgical intervention .  The patient's history has been reviewed, patient examined, no change in status, stable for surgery.  I have reviewed the patient's chart and labs.  Questions were answered to the patient's satisfaction.     [name]

## 2018-06-29 NOTE — Anesthesia Postprocedure Evaluation (Signed)
Anesthesia Post Note  Patient: ANIVAL PASHA  Procedure(s) Performed: RIGHT TOTAL HIP ARTHROPLASTY ANTERIOR APPROACH (Right Hip)     Patient location during evaluation: PACU Anesthesia Type: Spinal Level of consciousness: oriented and awake and alert Pain management: pain level controlled Vital Signs Assessment: post-procedure vital signs reviewed and stable Respiratory status: spontaneous breathing and respiratory function stable Cardiovascular status: blood pressure returned to baseline and stable Postop Assessment: no headache, no backache and no apparent nausea or vomiting Anesthetic complications: no    Last Vitals:  Vitals:   06/29/18 1345 06/29/18 1355  BP: 124/73 132/79  Pulse: (!) 55 (!) 55  Resp: 12 14  Temp: (!) 36.4 C (!) 36.4 C  SpO2: 94% 100%    Last Pain:  Vitals:   06/29/18 1431  TempSrc:   PainSc: [place]

## 2018-06-29 NOTE — Progress Notes (Signed)
Laddie Aquas RN notified of pts arrival in 20 minutes to 1524

## 2018-06-29 NOTE — Transfer of Care (Signed)
Immediate Anesthesia Transfer of Care Note  Patient: Jeffrey [name]  Procedure(s) Performed: RIGHT TOTAL HIP ARTHROPLASTY ANTERIOR APPROACH (Right Hip)  Patient Location: PACU  Anesthesia Type:Spinal  Level of Consciousness: awake, alert  and oriented  Airway & Oxygen Therapy: Patient Spontanous Breathing and Patient connected to face mask oxygen  Post-op Assessment: Report given to RN and Post -op Vital signs reviewed and stable  Post vital signs: Reviewed and stable  Last Vitals:  Vitals Value Taken Time  BP    Temp    Pulse    Resp    SpO2      Last Pain:  Vitals:   06/29/18 0853  TempSrc:   PainSc: 5       Patients Stated Pain Goal: 0 (81/85/90 9311)  Complications: No apparent anesthesia complications

## 2018-06-29 NOTE — Anesthesia Preprocedure Evaluation (Signed)
Anesthesia Evaluation  Patient identified by MRN, date of birth, ID band Patient awake    Reviewed: Allergy & Precautions, H&P , NPO status , Patient's Chart, lab work & pertinent test results, reviewed documented beta blocker date and time   History of Anesthesia Complications Negative for: history of anesthetic complications  Airway Mallampati: II  TM Distance: >3 FB Neck ROM: Full    Dental  (+) Missing, Dental Advisory Given, Chipped   Pulmonary COPD, former smoker,    breath sounds clear to auscultation       Cardiovascular hypertension, Pt. on medications and Pt. on home beta blockers + CAD, + Past MI, + Cardiac Stents and + CABG (2011)  + Valvular Problems/Murmurs (mild) AS  Rhythm:Regular Rate:Normal  '14 Cath: CAD, with some SVG occlusion, some collateral flow, medical management recommended    Neuro/Psych Anxiety negative neurological ROS     GI/Hepatic Neg liver ROS, GERD  Medicated and Controlled,  Endo/Other  Hypothyroidism   Renal/GU negative Renal ROS     Musculoskeletal  (+) Arthritis ,   Abdominal (+) + obese,   Peds  Hematology  (+) Blood dyscrasia (h/o ITP), ,   Anesthesia Other Findings   Reproductive/Obstetrics                             Anesthesia Physical  Anesthesia Plan  ASA: III  Anesthesia Plan: Spinal   Post-op Pain Management:    Induction: Intravenous  PONV Risk Score and Plan: 1 and Treatment may vary due to age or medical condition  Airway Management Planned: Simple Face Mask  Additional Equipment:   Intra-op Plan:   Post-operative Plan:   Informed Consent: I have reviewed the patients History and Physical, chart, labs and discussed the procedure including the risks, benefits and alternatives for the proposed anesthesia with the patient or authorized representative who has indicated his/her understanding and acceptance.     Plan  Discussed with: CRNA and Surgeon  Anesthesia Plan Comments:         Anesthesia Quick Evaluation

## 2018-06-30 ENCOUNTER — Encounter (HOSPITAL_COMMUNITY): Payer: Self-pay | Admitting: Orthopaedic Surgery

## 2018-06-30 NOTE — Plan of Care (Signed)
Plan of care discussed with patient 

## 2018-06-30 NOTE — Progress Notes (Signed)
Physical Therapy Treatment Patient Details Name: Jeffrey [name] MRN: [id number] DOB: Nov 17, 1934 Today's Date: 06/30/2018    History of Present Illness Pt is an 84 y/o male s/p elective R THA, direct anterior. PMH includes CAD, HTN, MI, ischemic cardiomyopathy, OSA, L THA, and L TKA.     PT Comments    The patient moves slowly. Noted tremors of UE's. Gait is unsteady and requires close  Guarding. Patient will benefit from HHPT.   Follow Up Recommendations  Follow surgeon's recommendation for DC plan and follow-up therapies;Supervision for mobility/OOB     Equipment Recommendations  None recommended by PT    Recommendations for Other Services OT consult     Precautions / Restrictions Precautions Precautions: Fall Precaution Comments: patient has  trmors, gait unsteady    Mobility  Bed Mobility   Bed Mobility: Supine to Sit     Supine to sit: Mod assist     General bed mobility comments: required increased assistance today. encouraged to not use  trapeze  Transfers Overall transfer level: Needs assistance Equipment used: Rolling walker (2 wheeled) Transfers: Sit to/from Stand Sit to Stand: Min assist         General transfer comment: Min A for lift assist and steadying. Verbal cues for safe hand placement.   Ambulation/Gait Ambulation/Gait assistance: Min assist Gait Distance (Feet): 85 Feet Assistive device: Rolling walker (2 wheeled) Gait Pattern/deviations: Step-to pattern;Step-through pattern;Drifts right/left;Trunk flexed;Decreased stride length;Shuffle;Narrow base of support Gait velocity: Decreased    General Gait Details: Slow, antalgic gait. shuffling gait, R leg tended to adduct.    Stairs             Wheelchair Mobility    Modified Rankin (Stroke Patients Only)       Balance Overall balance assessment: Needs assistance Sitting-balance support: No upper extremity supported;Feet supported Sitting balance-Leahy Scale: Fair      Standing balance support: Bilateral upper extremity supported;During functional activity Standing balance-Leahy Scale: Poor Standing balance comment: Reliant on BUE support.                             Cognition Arousal/Alertness: Awake/alert Behavior During Therapy: WFL for tasks assessed/performed;Flat affect                                          Exercises Total Joint Exercises Ankle Circles/Pumps: AROM;10 reps;Both Heel Slides: AAROM;Left;10 reps;Supine Hip ABduction/ADduction: AAROM;Left;10 reps;Supine    General Comments        Pertinent Vitals/Pain Pain Assessment: 0-10 Pain Score: 7  Pain Location: R hip  Pain Descriptors / Indicators: Aching;Operative site guarding Pain Intervention(s): Monitored during session;Premedicated before session;Ice applied    Home Living                      Prior Function            PT Goals (current goals can now be found in the care plan section) Progress towards PT goals: Progressing toward goals    Frequency    7X/week      PT Plan Current plan remains appropriate    Co-evaluation              AM-PAC PT "6 Clicks" Daily Activity  Outcome Measure  Difficulty turning over in bed (including adjusting bedclothes, sheets and blankets)?: A Lot Difficulty moving  from lying on back to sitting on the side of the bed? : A Lot Difficulty sitting down on and standing up from a chair with arms (e.g., wheelchair, bedside commode, etc,.)?: A Lot Help needed moving to and from a bed to chair (including a wheelchair)?: A Lot Help needed walking in hospital room?: A Lot Help needed climbing 3-5 steps with a railing? : Total 6 Click Score: 11    End of Session Equipment Utilized During Treatment: Gait belt Activity Tolerance: Patient tolerated treatment well Patient left: in chair;with call bell/phone within reach;with family/visitor present Nurse Communication: Mobility status PT  Visit Diagnosis: Other abnormalities of gait and mobility (R26.89);Pain Pain - Right/Left: Right Pain - part of body: Hip     Time: 6168-3729 PT Time Calculation (min) (ACUTE ONLY): 32 min  Charges:  $Gait Training: 8-22 mins $Therapeutic Exercise: 8-22 mins                    G CodesTresa [name] PT [phone]    Jeffrey [name] 06/30/2018, 1:22 PM

## 2018-06-30 NOTE — Progress Notes (Signed)
Physical Therapy Treatment Patient Details Name: Jeffrey [name] MRN: [id number] DOB: [birth year] Today's Date: 06/30/2018    History of Present Illness Pt is an 84 y/o male s/p elective R THA, direct anterior. PMH includes CAD, HTN, MI, ischemic cardiomyopathy, OSA, L THA, and L TKA.     PT Comments     Patient  Is progressing , gait is unsteady at times with  Shuffle steps. Plans Dc tomorrow.   Follow Up Recommendations  Follow surgeon's recommendation for DC plan and follow-up therapies;Supervision for mobility/OOB;Home health PT     Equipment Recommendations  None recommended by PT    Recommendations for Other Services OT consult     Precautions / Restrictions Precautions Precautions: Fall Precaution Comments: patient has  tremors, gait unsteady Restrictions RLE Weight Bearing: Weight bearing as tolerated    Mobility  Bed Mobility   Bed Mobility: Supine to Sit     Supine to sit: Mod assist     General bed mobility comments: in recliner  Transfers Overall transfer level: Needs assistance Equipment used: Rolling walker (2 wheeled) Transfers: Sit to/from Stand Sit to Stand: Min assist         General transfer comment: Min A for lift assist and steadying. Verbal cues for safe hand placement.   Ambulation/Gait Ambulation/Gait assistance: Min assist Gait Distance (Feet): 85 Feet Assistive device: Rolling walker (2 wheeled) Gait Pattern/deviations: Step-through pattern;Drifts right/left;Trunk flexed;Decreased stride length;Shuffle;Narrow base of support Gait velocity: Decreased    General Gait Details: Slow,  shuffling gait, R leg tended to adduct.    Stairs             Wheelchair Mobility    Modified Rankin (Stroke Patients Only)       Balance Overall balance assessment: Needs assistance Sitting-balance support: No upper extremity supported;Feet supported Sitting balance-Leahy Scale: Fair     Standing balance support: Bilateral upper  extremity supported;During functional activity Standing balance-Leahy Scale: Poor Standing balance comment: Reliant on BUE support.                             Cognition Arousal/Alertness: Awake/alert Behavior During Therapy: Flat affect                                          Exercises Total Joint Exercises Ankle Circles/Pumps: AROM;10 reps;Both Heel Slides: AAROM;Left;10 reps;Supine Hip ABduction/ADduction: AAROM;Left;10 reps;Supine Long Arc Quad: AROM;10 reps;Seated;Left;Right Marching in Standing: AAROM;Seated;10 reps;Right;Left    General Comments        Pertinent Vitals/Pain Pain Assessment: 0-10 Pain Score: 8  Pain Location: R hip  Pain Descriptors / Indicators: Aching;Operative site guarding Pain Intervention(s): Monitored during session;Premedicated before session    Home Living                      Prior Function            PT Goals (current goals can now be found in the care plan section) Progress towards PT goals: Progressing toward goals    Frequency    7X/week      PT Plan Current plan remains appropriate    Co-evaluation              AM-PAC PT "6 Clicks" Daily Activity  Outcome Measure  Difficulty turning over in bed (including adjusting bedclothes, sheets and blankets)?: A  Lot Difficulty moving from lying on back to sitting on the side of the bed? : A Lot Difficulty sitting down on and standing up from a chair with arms (e.g., wheelchair, bedside commode, etc,.)?: A Lot Help needed moving to and from a bed to chair (including a wheelchair)?: A Lot Help needed walking in hospital room?: A Lot Help needed climbing 3-5 steps with a railing? : Total 6 Click Score: 11    End of Session Equipment Utilized During Treatment: Gait belt Activity Tolerance: Patient tolerated treatment well Patient left: in chair;with call bell/phone within reach;with family/visitor present Nurse Communication: Mobility  status PT Visit Diagnosis: Other abnormalities of gait and mobility (R26.89);Pain Pain - Right/Left: Right Pain - part of body: Hip     Time: 8875-7972 PT Time Calculation (min) (ACUTE ONLY): 24 min  Charges:  $Gait Training: 8-22 mins $Therapeutic Exercise: 8-22 mins                    G CodesTresa Endo PT [phone]    Claretha Cooper 06/30/2018, 4:50 PM

## 2018-06-30 NOTE — Progress Notes (Signed)
Subjective: 1 Day Post-Op Procedure(s) (LRB): RIGHT TOTAL HIP ARTHROPLASTY ANTERIOR APPROACH (Right)  Activity level:  wbat Diet tolerance:  ok Voiding:  ok Patient reports pain as mild and moderate.    Objective: Vital signs in last 24 hours: Temp:  [97.5 F (36.4 C)-98.1 F (36.7 C)] 97.7 F (36.5 C) (07/10 0612) Pulse Rate:  [55-69] 56 (07/10 0612) Resp:  [11-20] 16 (07/10 0612) BP: (102-159)/(57-94) 107/65 (07/10 0612) SpO2:  [94 %-100 %] 95 % (07/10 0612) Weight:  [105.3 kg (232 lb 3.2 oz)] 105.3 kg (232 lb 3.2 oz) (07/09 1501)  Labs: No results for input(s): HGB in the last 72 hours. No results for input(s): WBC, RBC, HCT, PLT in the last 72 hours. No results for input(s): NA, K, CL, CO2, BUN, CREATININE, GLUCOSE, CALCIUM in the last 72 hours. No results for input(s): LABPT, INR in the last 72 hours.  Physical Exam:  Neurologically intact ABD soft Neurovascular intact Sensation intact distally Intact pulses distally Dorsiflexion/Plantar flexion intact Incision: dressing C/D/I and no drainage No cellulitis present Compartment soft  Assessment/Plan:  1 Day Post-Op Procedure(s) (LRB): RIGHT TOTAL HIP ARTHROPLASTY ANTERIOR APPROACH (Right) Advance diet Up with therapy D/C IV fluids Plan for discharge tomorrow Discharge home with home health if doing well and cleared by PT. Continue on ASA 325mg  BID x 2 weeks post op. Follow up in office 2 weeks post op.  Ilai Hiller, Larwance Sachs 06/30/2018, 7:36 AM

## 2018-06-30 NOTE — Evaluation (Signed)
Occupational Therapy Evaluation Patient Details Name: Jeffrey [name] MRN: [id number] DOB: Feb 01, 1934 Today's Date: 06/30/2018    History of Present Illness Pt is an 84 y/o male s/p elective R THA, direct anterior. PMH includes CAD, HTN, MI, ischemic cardiomyopathy, OSA, L THA, and L TKA.    Clinical Impression   Pt is s/p THA resulting in the deficits listed below (see OT Problem List).  Pt will benefit from skilled OT to increase their safety and independence with ADL and functional mobility for ADL to facilitate discharge to venue listed below.        Follow Up Recommendations  Home health OT    Equipment Recommendations  3 in 1 bedside commode       Precautions / Restrictions Precautions Precautions: Fall Precaution Comments: patient has  tremors, gait unsteady Restrictions RLE Weight Bearing: Weight bearing as tolerated      Mobility Bed Mobility Overal bed mobility: Needs Assistance Bed Mobility: Supine to Sit     Supine to sit: Supervision     General bed mobility comments: Supervision for safety. Increased time required.   Transfers Overall transfer level: Needs assistance Equipment used: Rolling walker (2 wheeled) Transfers: Sit to/from Stand Sit to Stand: Min assist         General transfer comment: Min A for lift assist and steadying. Verbal cues for safe hand placement.     Balance Overall balance assessment: Needs assistance Sitting-balance support: No upper extremity supported;Feet supported Sitting balance-Leahy Scale: Fair     Standing balance support: Bilateral upper extremity supported;During functional activity Standing balance-Leahy Scale: Poor Standing balance comment: Reliant on BUE support.                            ADL either performed or assessed with clinical judgement   ADL Overall ADL's : Needs assistance/impaired Eating/Feeding: Minimal assistance;Sitting   Grooming: Minimal assistance;Sitting   Upper Body  Bathing: Minimal assistance;Sitting   Lower Body Bathing: Maximal assistance;Sit to/from stand;Cueing for sequencing;Cueing for safety   Upper Body Dressing : Minimal assistance;Sitting   Lower Body Dressing: Maximal assistance;Sit to/from stand;Cueing for sequencing;Cueing for safety   Toilet Transfer: Minimal assistance;Ambulation;Stand-pivot;RW;Moderate assistance   Toileting- Clothing Manipulation and Hygiene: Moderate assistance;Sit to/from stand;Cueing for sequencing;Cueing for safety               Vision Patient Visual Report: No change from baseline              Pertinent Vitals/Pain Pain Assessment: 0-10 Pain Score: 4  Pain Location: R hip  Pain Descriptors / Indicators: Aching;Operative site guarding Pain Intervention(s): Monitored during session     Hand Dominance Right   Extremity/Trunk Assessment Upper Extremity Assessment Upper Extremity Assessment: Generalized weakness           Communication Communication Communication: No difficulties   Cognition Arousal/Alertness: Awake/alert Behavior During Therapy: WFL for tasks assessed/performed Overall Cognitive Status: Within Functional Limits for tasks assessed                                     General Comments               Home Living Family/patient expects to be discharged to:: Private residence Living Arrangements: Spouse/significant other Available Help at Discharge: Family;Available 24 hours/day Type of Home: House Home Access: Stairs to enter [employer] of Steps: 5 Entrance  Stairs-Rails: Left Home Layout: Two level;Able to live on main level with bedroom/bathroom     Bathroom Shower/Tub: Tub/shower unit   Bathroom Toilet: Handicapped height     Home Equipment: Other (comment);Walker - 2 wheels;Bedside commode;Cane - single point;Crutches;Cane - quad(lift chair )          Prior Functioning/Environment Level of Independence: Independent with  assistive device(s)        Comments: Used cane for ambulation         OT Problem List: Decreased strength;Decreased safety awareness;Decreased knowledge of use of DME or AE      OT Treatment/Interventions: Self-care/ADL training;Patient/family education;DME and/or AE instruction    OT Goals(Current goals can be found in the care plan section) Acute Rehab OT Goals Patient Stated Goal: to decrease pain  OT Goal Formulation: With patient Time For Goal Achievement: 07/07/18 Potential to Achieve Goals: Good ADL Goals Pt Will Perform Upper Body Dressing: with set-up;sitting Pt Will Perform Lower Body Dressing: with min assist;sit to/from stand Pt Will Transfer to Toilet: with min assist;regular height toilet Pt Will Perform Toileting - Clothing Manipulation and hygiene: with min assist;sit to/from stand Pt Will Perform Tub/Shower Transfer: Tub transfer;tub bench  OT Frequency: Min 2X/week              AM-PAC PT "6 Clicks" Daily Activity     Outcome Measure Help from another person eating meals?: None Help from another person taking care of personal grooming?: A Little Help from another person toileting, which includes using toliet, bedpan, or urinal?: A Lot Help from another person bathing (including washing, rinsing, drying)?: A Lot Help from another person to put on and taking off regular upper body clothing?: A Little Help from another person to put on and taking off regular lower body clothing?: A Lot 6 Click Score: 16   End of Session Nurse Communication: Mobility status  Activity Tolerance: Patient tolerated treatment well Patient left: in chair;with call bell/phone within reach;with family/visitor present  OT Visit Diagnosis: Unsteadiness on feet (R26.81);Muscle weakness (generalized) (M62.81);History of falling (Z91.81)                Time: 2641-5830 OT Time Calculation (min): 30 min Charges:  OT General Charges $OT Visit: 1 Visit OT Evaluation $OT Eval Low  Complexity: 1 Low OT Treatments $Self Care/Home Management : 8-22 mins G-Codes:     Kari Baars, [place]  Payton Mccallum D 06/30/2018, 5:00 PM

## 2018-06-30 NOTE — Progress Notes (Signed)
CSW Consult-SNF Plan: [place], [place], MSW Clinical Social Worker  [phone] 06/30/2018  8:20 AM

## 2018-06-30 NOTE — Progress Notes (Signed)
Discharge planning, spoke with patient and spouse at beside. Chose [facility] for [facility] services, PT to eval and treat. Contacted [facility] for referral. Has RW and 3-n-1. [phone]

## 2018-07-01 MED ORDER — SODIUM CHLORIDE 0.9 % IV BOLUS
500.0000 mL | Freq: Once | INTRAVENOUS | Status: AC
  Administered 2018-07-01: 500 mL via INTRAVENOUS

## 2018-07-01 MED ORDER — DOCUSATE SODIUM 100 MG PO CAPS: 100.0000 mg | ORAL_CAPSULE | Freq: Two times a day (BID) | ORAL | 0 refills | Status: DC

## 2018-07-01 MED ORDER — ASPIRIN 325 MG PO TBEC: 325.0000 mg | DELAYED_RELEASE_TABLET | Freq: Two times a day (BID) | ORAL | 0 refills | Status: DC

## 2018-07-01 MED ORDER — HYDROCODONE-ACETAMINOPHEN 7.5-325 MG PO TABS: 1.0000 | ORAL_TABLET | ORAL | 0 refills | Status: DC | PRN

## 2018-07-01 MED ORDER — TRAMADOL HCL 50 MG PO TABS
50.0000 mg | ORAL_TABLET | Freq: Four times a day (QID) | ORAL | Status: DC | PRN
Start: 2018-07-01 — End: 2018-07-02
  Administered 2018-07-01: 100 mg via ORAL
  Administered 2018-07-02: 50 mg via ORAL
  Filled 2018-07-01 (×2): qty 2

## 2018-07-01 MED ORDER — TIZANIDINE HCL 4 MG PO TABS: 4.0000 mg | ORAL_TABLET | Freq: Four times a day (QID) | ORAL | 1 refills | Status: DC | PRN

## 2018-07-01 MED ORDER — BISACODYL 5 MG PO TBEC: 5.0000 mg | DELAYED_RELEASE_TABLET | Freq: Every day | ORAL | 0 refills | Status: DC | PRN

## 2018-07-01 NOTE — Progress Notes (Signed)
Called to room by patient's wife. The patient says he is hallucinating when he closes his eyes. The patient seems very anxious, , and is visibly shaking. Patient is to go home today, per AVS, patient will go home on Norco for pain. The wife says he does not tolerate medications like this and thinks this is the cause. I called Dr. Jerald Kief office and spoke with answering service to have [place], [place] call me back to change discharge pain medication orders. Awaiting his response. Will continue to monitor.

## 2018-07-01 NOTE — Progress Notes (Signed)
Called to room again. The patient is crying and when I asked why he was upset he stated "Its just indescribable". The wife is worried and says he has a history of severe panic attacks and says she cannot take him home like this. Informed her I was still waiting on the PA to get back in touch with me. Gave the patient and wife emotional support.

## 2018-07-01 NOTE — Progress Notes (Signed)
Physical Therapy Treatment Patient Details Name: Jeffrey [name] MRN: [id number] DOB: Feb 21, 1934 Today's Date: 07/01/2018    History of Present Illness Pt is an 84 y/o male s/p elective R THA, direct anterior. PMH includes CAD, HTN, MI, ischemic cardiomyopathy, OSA, L THA, and L TKA.     PT Comments    The patient is moving slowly, very lethargic and gait is unsteady. Recommended to patient's wife to use gait belt and that patient needs someone to ambulate with the patient at all times. Patient is planning to DC  Today.   Follow Up Recommendations  Follow surgeon's recommendation for DC plan and follow-up therapies;Supervision for mobility/OOB;Home health PT     Equipment Recommendations  None recommended by PT    Recommendations for Other Services       Precautions / Restrictions Precautions Precautions: Fall Precaution Comments: patient has  tremors, gait unsteady    Mobility  Bed Mobility Overal bed mobility: Needs Assistance Bed Mobility: Supine to Sit     Supine to sit: Mod assist     General bed mobility comments: Increased time required. . Assist with trunk  Transfers Overall transfer level: Needs assistance Equipment used: Rolling walker (2 wheeled) Transfers: Sit to/from Stand Sit to Stand: Min assist         General transfer comment: Min A for lift assist and steadying. Verbal cues for safe hand placement.   Ambulation/Gait Ambulation/Gait assistance: Min assist Gait Distance (Feet): 60 Feet Assistive device: Rolling walker (2 wheeled) Gait Pattern/deviations: Step-through pattern;Drifts right/left;Trunk flexed;Decreased stride length;Shuffle;Narrow base of support;Festinating Gait velocity: Decreased    General Gait Details: Slow,  shuffling gait, R leg tended to adduct.    Stairs Stairs: Yes Stairs assistance: Mod assist Stair Management: Forwards;With walker;No rails Number of Stairs: 1 General stair comments: x 2 practice with wife present.  Gait belt provided for safety   Wheelchair Mobility    Modified Rankin (Stroke Patients Only)       Balance           Standing balance support: Bilateral upper extremity supported;During functional activity Standing balance-Leahy Scale: Poor Standing balance comment: Reliant on BUE support.                             Cognition Arousal/Alertness: Awake/alert                                            Exercises      General Comments        Pertinent Vitals/Pain Pain Score: 6  Pain Location: R hip , neck Pain Descriptors / Indicators: Aching;Operative site guarding Pain Intervention(s): Monitored during session;Premedicated before session    Home Living                      Prior Function            PT Goals (current goals can now be found in the care plan section) Progress towards PT goals: Progressing toward goals    Frequency    7X/week      PT Plan Current plan remains appropriate    Co-evaluation              AM-PAC PT "6 Clicks" Daily Activity  Outcome Measure  Difficulty turning over in bed (including adjusting bedclothes, sheets and  blankets)?: A Lot Difficulty moving from lying on back to sitting on the side of the bed? : A Lot Difficulty sitting down on and standing up from a chair with arms (e.g., wheelchair, bedside commode, etc,.)?: A Lot Help needed moving to and from a bed to chair (including a wheelchair)?: A Lot Help needed walking in hospital room?: A Lot Help needed climbing 3-5 steps with a railing? : Total 6 Click Score: 11    End of Session Equipment Utilized During Treatment: Gait belt Activity Tolerance: Patient tolerated treatment well Patient left: in chair;with call bell/phone within reach;with family/visitor present Nurse Communication: Mobility status PT Visit Diagnosis: Other abnormalities of gait and mobility (R26.89);Pain Pain - Right/Left: Right Pain - part of body:  Hip     Time: 2575-0518 PT Time Calculation (min) (ACUTE ONLY): 23 min  Charges:  $Gait Training: 23-37 mins                    G CodesTresa Endo PT [phone]    Claretha Cooper 07/01/2018, 12:20 PM

## 2018-07-01 NOTE — Plan of Care (Signed)
Plan of care discussed with patient and wife

## 2018-07-01 NOTE — Discharge Summary (Deleted)
Patient ID: Jeffrey [name] MRN: [id number] DOB/AGE: [birth year] 84 y.o.  Admit date: 06/29/2018 Discharge date: 07/01/2018  Admission Diagnoses:  Principal Problem:   Primary osteoarthritis of right hip   Discharge Diagnoses:  Same  Past Medical History:  Diagnosis Date  . Anxiety   . Aortic stenosis    mild AS by 07/20/13 echo ([facility])  . Arthritis    "all over"  . Coronary artery disease   . Depression   . GERD (gastroesophageal reflux disease)   . H/O hiatal hernia   . Hearing aid worn    B/L  . High cholesterol   . History of blood transfusion reaction 03-14-2013   "he almost died; he has to get irradiated blood next time"  . Hypertension   . Hypothyroidism   . Ischemic cardiomyopathy   . ITP (idiopathic thrombocytopenic purpura)    Dr. Gaynelle Arabian, on [place]  . Myocardial infarction ([place]) Mar 14, 2010  . Pneumonia 1940's X 1; March 14, 2014 X 1  . Shortness of breath    with exertion, has not been very active  . Sleep apnea   . Wears glasses   . Wears partial dentures     Surgeries: Procedure(s): RIGHT TOTAL HIP ARTHROPLASTY ANTERIOR APPROACH on 06/29/2018   Consultants:   Discharged Condition: Improved  Hospital Course: Jeffrey [name] is an 84 y.o. male who was admitted 06/29/2018 for operative treatment ofPrimary osteoarthritis of right hip. Patient has severe unremitting pain that affects sleep, daily activities, and work/hobbies. After pre-op clearance the patient was taken to the operating room on 06/29/2018 and underwent  Procedure(s): RIGHT TOTAL HIP ARTHROPLASTY ANTERIOR APPROACH.    Patient was given perioperative antibiotics:  Anti-infectives (From admission, onward)   Start     Dose/Rate Route Frequency Ordered Stop   06/29/18 1700  ceFAZolin (ANCEF) IVPB 2g/100 mL premix     2 g 200 mL/hr over 30 Minutes Intravenous Every 6 hours 06/29/18 1402 06/29/18 2300   06/29/18 0900  ceFAZolin (ANCEF) IVPB 2g/100 mL premix     2 g 200 mL/hr over 30 Minutes  Intravenous On call to O.R. 06/29/18 0809 06/29/18 1110   06/29/18 0700  vancomycin (VANCOCIN) 1,500 mg in sodium chloride 0.9 % 500 mL IVPB     1,500 mg 250 mL/hr over 120 Minutes Intravenous To Surgery 06/28/18 1200 06/29/18 1133       Patient was given sequential compression devices, early ambulation, and chemoprophylaxis to prevent DVT.  Patient benefited maximally from hospital stay and there were no complications.    Recent vital signs:  Patient Vitals for the past 24 hrs:  BP Temp Temp src Pulse Resp SpO2  07/01/18 0526 (!) 100/57 99.1 F (37.3 C) Oral 64 18 96 %  06/30/18 03-14-28 111/64 98.4 F (36.9 C) Oral 73 18 98 %  06/30/18 1446 106/60 98.1 F (36.7 C) Oral 64 15 98 %  06/30/18 1002 113/64 97.9 F (36.6 C) Oral (!) 54 15 96 %  06/30/18 0844 - - - 60 17 96 %     Recent laboratory studies: No results for input(s): WBC, HGB, HCT, PLT, NA, K, CL, CO2, BUN, CREATININE, GLUCOSE, INR, CALCIUM in the last 72 hours.  Invalid input(s): PT, 2   Discharge Medications:   Allergies as of 07/01/2018      Reactions   Lipitor [atorvastatin] Other (See Comments)   muscle spasms cramps   Methylprednisolone Other (See Comments)   cramps cramps   Other    If patient  is to receive blood - blood must be treated witgh radiation because his body will not accept a normal infusion    Doxycycline Rash   Doxycycline caused itching redness on scalp and head Doxycycline caused itching redness on scalp and head      Medication List    STOP taking these medications   naproxen sodium 220 MG tablet Commonly known as:  ALEVE     TAKE these medications   ALPRAZolam 0.25 MG tablet Commonly known as:  XANAX Take 1 tablet (0.25 mg total) by mouth 3 (three) times daily as needed for anxiety.   aspirin 325 MG EC tablet Take 1 tablet (325 mg total) by mouth 2 (two) times daily after a meal. What changed:    medication strength  how much to take  when to take this   b complex  vitamins tablet Take 1 tablet by mouth at bedtime.   bisacodyl 5 MG EC tablet Commonly known as:  DULCOLAX Take 1 tablet (5 mg total) by mouth daily as needed for moderate constipation.   carvedilol 6.25 MG tablet Commonly known as:  COREG Take 12.5 mg by mouth at bedtime.   docusate sodium 100 MG capsule Commonly known as:  COLACE Take 1 capsule (100 mg total) by mouth 2 (two) times daily.   Fish Oil 1200 MG Caps Take 1,200 mg by mouth at bedtime. 360 MG OMEGA-3   Fluticasone-Salmeterol 100-50 MCG/DOSE Aepb Commonly known as:  ADVAIR Inhale 1 puff into the lungs daily.   HYDROcodone-acetaminophen 7.5-325 MG tablet Commonly known as:  NORCO Take 1-2 tablets by mouth every 4 (four) hours as needed for severe pain (pain score 7-10).   LEG CRAMPS PM Subl Place 1 tablet under the tongue at bedtime.   levothyroxine 200 MCG tablet Commonly known as:  SYNTHROID, LEVOTHROID Take 200 mcg by mouth at bedtime.   losartan 50 MG tablet Commonly known as:  COZAAR Take 50 mg by mouth at bedtime.   omeprazole 40 MG capsule Commonly known as:  PRILOSEC Take 40 mg by mouth at bedtime.   tiZANidine 4 MG tablet Commonly known as:  ZANAFLEX Take 1 tablet (4 mg total) by mouth every 6 (six) hours as needed for muscle spasms.   Vitamin D3 2000 units capsule Take 2,000 Units by mouth at bedtime.            Durable Medical Equipment  (From admission, onward)        Start     Ordered   06/29/18 1403  DME Walker rolling  Once    Question:  Patient needs a walker to treat with the following condition  Answer:  Primary osteoarthritis of right hip   06/29/18 1402   06/29/18 1403  DME 3 n 1  Once     06/29/18 1402   06/29/18 1403  DME Bedside commode  Once    Question:  Patient needs a bedside commode to treat with the following condition  Answer:  Primary osteoarthritis of right hip   06/29/18 1402      Diagnostic Studies: Dg Chest 2 View  Result Date: 06/17/2018 CLINICAL  DATA:  Preoperative respiratory exam for hip replacement. Smoking history. EXAM: CHEST - 2 VIEW COMPARISON:  04/09/2017 FINDINGS: Previous median sternotomy and CABG. Heart size is normal. Aortic atherosclerosis and tortuosity. The lungs are clear. The vascularity is normal. No effusions. Ordinary degenerative changes affect the spine. IMPRESSION: No active disease.  Previous CABG.  Aortic atherosclerosis. Electronically Signed  By: Nelson Chimes M.D.   On: 06/17/2018 16:00   Dg C-arm 1-60 Min-no Report  Result Date: 06/29/2018 Fluoroscopy was utilized by the requesting physician.  No radiographic interpretation.   Dg Hip Operative Unilat W Or W/o Pelvis Right  Result Date: 06/29/2018 CLINICAL DATA:  Hip replacement EXAM: OPERATIVE right HIP (WITH PELVIS IF PERFORMED) 2 VIEWS TECHNIQUE: Fluoroscopic spot image(s) were submitted for interpretation post-operatively. COMPARISON:  None. FINDINGS: Frontal projection of the right hip on 2 spot fluoro images shows the patient to be status post joint replacement. No evidence for immediate hardware complications. IMPRESSION: Intraoperative assessment during right hip replacement. Electronically Signed   By: Misty Stanley M.D.   On: 06/29/2018 12:35    Disposition: Discharge disposition: 01-Home or Self Care       Discharge Instructions    Call MD / Call 911   Complete by:  As directed    If you experience chest pain or shortness of breath, CALL 911 and be transported to the hospital emergency room.  If you develope a fever above 101 F, pus (white drainage) or increased drainage or redness at the wound, or calf pain, call your surgeon's office.   Constipation Prevention   Complete by:  As directed    Drink plenty of fluids.  Prune juice may be helpful.  You may use a stool softener, such as Colace (over the counter) 100 mg twice a day.  Use MiraLax (over the counter) for constipation as needed.   Diet - low sodium heart healthy   Complete by:  As  directed    Discharge instructions   Complete by:  As directed    INSTRUCTIONS AFTER JOINT REPLACEMENT   Remove items at home which could result in a fall. This includes throw rugs or furniture in walking pathways ICE to the affected joint every three hours while awake for 30 minutes at a time, for at least the first 3-5 days, and then as needed for pain and swelling.  Continue to use ice for pain and swelling. You may notice swelling that will progress down to the foot and ankle.  This is normal after surgery.  Elevate your leg when you are not up walking on it.   Continue to use the breathing machine you got in the hospital (incentive spirometer) which will help keep your temperature down.  It is common for your temperature to cycle up and down following surgery, especially at night when you are not up moving around and exerting yourself.  The breathing machine keeps your lungs expanded and your temperature down.   DIET:  As you were doing prior to hospitalization, we recommend a well-balanced diet.  DRESSING / WOUND CARE / SHOWERING  You may shower 3 days after surgery, but keep the wounds dry during showering.  You may use an occlusive plastic wrap (Press'n Seal for example), NO SOAKING/SUBMERGING IN THE BATHTUB.  If the bandage gets wet, change with a clean dry gauze.  If the incision gets wet, pat the wound dry with a clean towel.  ACTIVITY  Increase activity slowly as tolerated, but follow the weight bearing instructions below.   No driving for 6 weeks or until further direction given by your physician.  You cannot drive while taking narcotics.  No lifting or carrying greater than 10 lbs. until further directed by your surgeon. Avoid periods of inactivity such as sitting longer than an hour when not asleep. This helps prevent blood clots.  You may return  to work once you are authorized by your doctor.     WEIGHT BEARING   Weight bearing as tolerated with assist device (walker,  cane, etc) as directed, use it as long as suggested by your surgeon or therapist, typically at least 4-6 weeks.   EXERCISES  Results after joint replacement surgery are often greatly improved when you follow the exercise, range of motion and muscle strengthening exercises prescribed by your doctor. Safety measures are also important to protect the joint from further injury. Any time any of these exercises cause you to have increased pain or swelling, decrease what you are doing until you are comfortable again and then slowly increase them. If you have problems or questions, call your caregiver or physical therapist for advice.   Rehabilitation is important following a joint replacement. After just a few days of immobilization, the muscles of the leg can become weakened and shrink (atrophy).  These exercises are designed to build up the tone and strength of the thigh and leg muscles and to improve motion. Often times heat used for twenty to thirty minutes before working out will loosen up your tissues and help with improving the range of motion but do not use heat for the first two weeks following surgery (sometimes heat can increase post-operative swelling).   These exercises can be done on a training (exercise) mat, on the floor, on a table or on a bed. Use whatever works the best and is most comfortable for you.    Use music or television while you are exercising so that the exercises are a pleasant break in your day. This will make your life better with the exercises acting as a break in your routine that you can look forward to.   Perform all exercises about fifteen times, three times per day or as directed.  You should exercise both the operative leg and the other leg as well.   Exercises include:   Quad Sets - Tighten up the muscle on the front of the thigh (Quad) and hold for 5-10 seconds.   Straight Leg Raises - With your knee straight (if you were given a brace, keep it on), lift the leg to 60  degrees, hold for 3 seconds, and slowly lower the leg.  Perform this exercise against resistance later as your leg gets stronger.  Leg Slides: Lying on your back, slowly slide your foot toward your buttocks, bending your knee up off the floor (only go as far as is comfortable). Then slowly slide your foot back down until your leg is flat on the floor again.  Angel Wings: Lying on your back spread your legs to the side as far apart as you can without causing discomfort.  Hamstring Strength:  Lying on your back, push your heel against the floor with your leg straight by tightening up the muscles of your buttocks.  Repeat, but this time bend your knee to a comfortable angle, and push your heel against the floor.  You may put a pillow under the heel to make it more comfortable if necessary.   A rehabilitation program following joint replacement surgery can speed recovery and prevent re-injury in the future due to weakened muscles. Contact your doctor or a physical therapist for more information on knee rehabilitation.    CONSTIPATION  Constipation is defined medically as fewer than three stools per week and severe constipation as less than one stool per week.  Even if you have a regular bowel pattern at home, your  normal regimen is likely to be disrupted due to multiple reasons following surgery.  Combination of anesthesia, postoperative narcotics, change in appetite and fluid intake all can affect your bowels.   YOU MUST use at least one of the following options; they are listed in order of increasing strength to get the job done.  They are all available over the counter, and you may need to use some, POSSIBLY even all of these options:    Drink plenty of fluids (prune juice may be helpful) and high fiber foods Colace 100 mg by mouth twice a day  Senokot for constipation as directed and as needed Dulcolax (bisacodyl), take with full glass of water  Miralax (polyethylene glycol) once or twice a day as  needed.  If you have tried all these things and are unable to have a bowel movement in the first 3-4 days after surgery call either your surgeon or your primary doctor.    If you experience loose stools or diarrhea, hold the medications until you stool forms back up.  If your symptoms do not get better within 1 week or if they get worse, check with your doctor.  If you experience "the worst abdominal pain ever" or develop nausea or vomiting, please contact the office immediately for further recommendations for treatment.   ITCHING:  If you experience itching with your medications, try taking only a single pain pill, or even half a pain pill at a time.  You can also use Benadryl over the counter for itching or also to help with sleep.   TED HOSE STOCKINGS:  Use stockings on both legs until for at least 2 weeks or as directed by physician office. They may be removed at night for sleeping.  MEDICATIONS:  See your medication summary on the "After Visit Summary" that nursing will review with you.  You may have some home medications which will be placed on hold until you complete the course of blood thinner medication.  It is important for you to complete the blood thinner medication as prescribed.  PRECAUTIONS:  If you experience chest pain or shortness of breath - call 911 immediately for transfer to the hospital emergency department.   If you develop a fever greater that 101 F, purulent drainage from wound, increased redness or drainage from wound, foul odor from the wound/dressing, or calf pain - CONTACT YOUR SURGEON.                                                   FOLLOW-UP APPOINTMENTS:  If you do not already have a post-op appointment, please call the office for an appointment to be seen by your surgeon.  Guidelines for how soon to be seen are listed in your "After Visit Summary", but are typically between 1-4 weeks after surgery.  OTHER INSTRUCTIONS:   Knee Replacement:  Do not place pillow  under knee, focus on keeping the knee straight while resting. CPM instructions: 0-90 degrees, 2 hours in the morning, 2 hours in the afternoon, and 2 hours in the evening. Place foam block, curve side up under heel at all times except when in CPM or when walking.  DO NOT modify, tear, cut, or change the foam block in any way.  MAKE SURE YOU:  Understand these instructions.  Get help right away if you are not doing well  or get worse.    Thank you for letting us be a part of your medical care team.  It is a privilege we respect greatly.  We hope these instructions will help you stay on track for a fast and full recovery!   Increase activity slowly as tolerated   Complete by:  As directed       Follow-up Information    Melrose Nakayama, MD. Schedule an appointment as soon as possible for a visit in 2 weeks.   Specialty:  Orthopedic Surgery Contact information: [place] [address] [address], [facility].   Specialty:  [place] Why:  physical therapy Contact information: [address] [place] [address] [phone]            Signed: Rich Fuchs 07/01/2018, 6:36 AM

## 2018-07-01 NOTE — Progress Notes (Signed)
Subjective: 2 Days Post-Op Procedure(s) (LRB): RIGHT TOTAL HIP ARTHROPLASTY ANTERIOR APPROACH (Right)   Patient doing well and wishes to go home today.  Activity level:  wbat Diet tolerance:  ok Voiding:  ok Patient reports pain as mild.    Objective: Vital signs in last 24 hours: Temp:  [97.9 F (36.6 C)-99.1 F (37.3 C)] 99.1 F (37.3 C) (07/11 0526) Pulse Rate:  [54-73] 64 (07/11 0526) Resp:  [15-18] 18 (07/11 0526) BP: (100-113)/(57-64) 100/57 (07/11 0526) SpO2:  [96 %-98 %] 96 % (07/11 0526)  Labs: No results for input(s): HGB in the last 72 hours. No results for input(s): WBC, RBC, HCT, PLT in the last 72 hours. No results for input(s): NA, K, CL, CO2, BUN, CREATININE, GLUCOSE, CALCIUM in the last 72 hours. No results for input(s): LABPT, INR in the last 72 hours.  Physical Exam:  Neurologically intact ABD soft Neurovascular intact Sensation intact distally Intact pulses distally Dorsiflexion/Plantar flexion intact Incision: dressing C/D/I and no drainage No cellulitis present Compartment soft  Assessment/Plan:  2 Days Post-Op Procedure(s) (LRB): RIGHT TOTAL HIP ARTHROPLASTY ANTERIOR APPROACH (Right) Advance diet Up with therapy Discharge home with home health today after PT if doing well and cleared. Follow up in office 2 weeks post op. Continue on ASA 325mg  BID x 2 weeks post op.  Jeffrey [name], Jeffrey [name] 07/01/2018, 6:32 AM

## 2018-07-01 NOTE — Progress Notes (Signed)
Occupational Therapy Treatment Patient Details Name: Jeffrey [name] MRN: [id number] DOB: March 28, 1934 Today's Date: 07/01/2018    History of present illness Pt is an 84 y/o male s/p elective R THA, direct anterior. PMH includes CAD, HTN, MI, ischemic cardiomyopathy, OSA, L THA, and L TKA.    OT comments  Limited participation this day as pt not feeling well  Follow Up Recommendations  Home health OT    Equipment Recommendations  3 in 1 bedside commode    Recommendations for Other Services      Precautions / Restrictions Precautions Precautions: Fall Precaution Comments: patient has  tremors, gait unsteady       Mobility Bed Mobility Overal bed mobility: Needs Assistance Bed Mobility: Supine to Sit     Supine to sit: Mod assist     General bed mobility comments: pt in chair  Transfers Overall transfer level: Needs assistance Equipment used: Rolling walker (2 wheeled) Transfers: Sit to/from Stand Sit to Stand: Min assist         General transfer comment: Min A for lift assist and steadying. Verbal cues for safe hand placement.     Balance           Standing balance support: Bilateral upper extremity supported;During functional activity Standing balance-Leahy Scale: Poor Standing balance comment: Reliant on BUE support.                            ADL either performed or assessed with clinical judgement   ADL Overall ADL's : Needs assistance/impaired Eating/Feeding: Minimal assistance;Sitting   Grooming: Minimal assistance;Sitting                                 General ADL Comments: pt feeling nauseous.  Provided throw up bag.       Vision Patient Visual Report: No change from baseline            Cognition Arousal/Alertness: Awake/alert Behavior During Therapy: Anxious Overall Cognitive Status: Difficult to assess(pt feeling sick this session and seemed fatigued)                                                      Pertinent Vitals/ Pain       Pain Score: 4  Pain Location: R hip  Pain Descriptors / Indicators: Aching;Operative site guarding Pain Intervention(s): Limited activity within patient's tolerance;Monitored during session     Prior Functioning/Environment              Frequency  Min 2X/week        Progress Toward Goals  OT Goals(current goals can now be found in the care plan section)  Progress towards OT goals: OT to reassess next treatment     Plan Discharge plan remains appropriate       AM-PAC PT "6 Clicks" Daily Activity     Outcome Measure   Help from another person eating meals?: None Help from another person taking care of personal grooming?: A Little Help from another person toileting, which includes using toliet, bedpan, or urinal?: A Lot Help from another person bathing (including washing, rinsing, drying)?: A Lot Help from another person to put on and taking off regular upper body clothing?: A Little Help from  another person to put on and taking off regular lower body clothing?: A Lot 6 Click Score: 16    End of Session    OT Visit Diagnosis: Unsteadiness on feet (R26.81);Muscle weakness (generalized) (M62.81);History of falling (Z91.81)   Activity Tolerance Patient tolerated treatment well   Patient Left in chair;with call bell/phone within reach;with family/visitor present   Nurse Communication Mobility status        Time: 1131-1140 OT Time Calculation (min): 9 min  Charges: OT General Charges $OT Visit: 1 Visit OT Treatments $Self Care/Home Management : 8-22 mins  [place], [place]   Betsy Pries 07/01/2018, 1:17 PM

## 2018-07-02 MED ORDER — TRAMADOL HCL 50 MG PO TABS: 50.0000 mg | ORAL_TABLET | Freq: Four times a day (QID) | ORAL | 1 refills | Status: DC | PRN

## 2018-07-02 MED ORDER — ALPRAZOLAM 0.25 MG PO TABS: 0.2500 mg | ORAL_TABLET | Freq: Two times a day (BID) | ORAL | 0 refills | Status: DC | PRN

## 2018-07-02 NOTE — Progress Notes (Signed)
Subjective: 3 Days Post-Op Procedure(s) (LRB): RIGHT TOTAL HIP ARTHROPLASTY ANTERIOR APPROACH (Right)   Patient feels much more clear headed this morning. He does have some pain but does not want that stronger medicine any more. He is hoping to go home today. He only has 2 xanax left at home and would like a refill incase he has another panic attack.  Activity level:  wbat Diet tolerance:  ok Voiding:  ok Patient reports pain as mild and moderate.    Objective: Vital signs in last 24 hours: Temp:  [97.7 F (36.5 C)-99.2 F (37.3 C)] 97.7 F (36.5 C) (07/12 0636) Pulse Rate:  [59-78] 59 (07/12 0636) Resp:  [16-18] 16 (07/12 0636) BP: (104-136)/(41-71) 118/59 (07/12 0636) SpO2:  [91 %-95 %] 95 % (07/12 0636)  Labs: No results for input(s): HGB in the last 72 hours. No results for input(s): WBC, RBC, HCT, PLT in the last 72 hours. No results for input(s): NA, K, CL, CO2, BUN, CREATININE, GLUCOSE, CALCIUM in the last 72 hours. No results for input(s): LABPT, INR in the last 72 hours.  Physical Exam:  Neurologically intact ABD soft Neurovascular intact Sensation intact distally Intact pulses distally Dorsiflexion/Plantar flexion intact Incision: dressing C/D/I and no drainage No cellulitis present Compartment soft  Assessment/Plan:  3 Days Post-Op Procedure(s) (LRB): RIGHT TOTAL HIP ARTHROPLASTY ANTERIOR APPROACH (Right) Advance diet Up with therapy Discharge home with home health today after PT. We will send him out on tramadol, xanax, and tizanidine. Continue on asa for dvt prevention. Follow up in office 2 weeks post op.  Jeffrey [name], Larwance Sachs 07/02/2018, 7:57 AM

## 2018-07-02 NOTE — Progress Notes (Signed)
Pt alert, oriented, tolerating diet. D/C instructions and prescriptions were given, all questions answered. Pt was d/cd home.

## 2018-07-02 NOTE — Discharge Summary (Signed)
Patient ID: KHARSON RASMUSSON MRN: [id number] DOB/AGE: 1934-06-30 84 y.o.  Admit date: 06/29/2018 Discharge date: 07/02/2018  Admission Diagnoses:  Principal Problem:   Primary osteoarthritis of right hip   Discharge Diagnoses:  Same  Past Medical History:  Diagnosis Date  . Anxiety   . Aortic stenosis    mild AS by 07/20/13 echo ([facility])  . Arthritis    "all over"  . Coronary artery disease   . Depression   . GERD (gastroesophageal reflux disease)   . H/O hiatal hernia   . Hearing aid worn    B/L  . High cholesterol   . History of blood transfusion reaction 03-15-13   "he almost died; he has to get irradiated blood next time"  . Hypertension   . Hypothyroidism   . Ischemic cardiomyopathy   . ITP (idiopathic thrombocytopenic purpura)    Dr. Gaynelle Arabian, on [place]  . Myocardial infarction ([place]) 15-Mar-2010  . Pneumonia 1940's X 1; Mar 15, 2014 X 1  . Shortness of breath    with exertion, has not been very active  . Sleep apnea   . Wears glasses   . Wears partial dentures     Surgeries: Procedure(s): RIGHT TOTAL HIP ARTHROPLASTY ANTERIOR APPROACH on 06/29/2018   Consultants:   Discharged Condition: Improved  Hospital Course: FRANCISO DIERKS is an 84 y.o. male who was admitted 06/29/2018 for operative treatment ofPrimary osteoarthritis of right hip. Patient has severe unremitting pain that affects sleep, daily activities, and work/hobbies. After pre-op clearance the patient was taken to the operating room on 06/29/2018 and underwent  Procedure(s): RIGHT TOTAL HIP ARTHROPLASTY ANTERIOR APPROACH.    Patient was given perioperative antibiotics:  Anti-infectives (From admission, onward)   Start     Dose/Rate Route Frequency Ordered Stop   06/29/18 1700  ceFAZolin (ANCEF) IVPB 2g/100 mL premix     2 g 200 mL/hr over 30 Minutes Intravenous Every 6 hours 06/29/18 1402 06/29/18 2300   06/29/18 0900  ceFAZolin (ANCEF) IVPB 2g/100 mL premix     2 g 200 mL/hr over 30 Minutes  Intravenous On call to O.R. 06/29/18 0809 06/29/18 1110   06/29/18 0700  vancomycin (VANCOCIN) 1,500 mg in sodium chloride 0.9 % 500 mL IVPB     1,500 mg 250 mL/hr over 120 Minutes Intravenous To Surgery 06/28/18 1200 06/29/18 1133       Patient was given sequential compression devices, early ambulation, and chemoprophylaxis to prevent DVT.  Patient benefited maximally from hospital stay and there were no complications.    Recent vital signs:  Patient Vitals for the past 24 hrs:  BP Temp Temp src Pulse Resp SpO2  07/02/18 0636 (!) 118/59 97.7 F (36.5 C) Oral (!) 59 16 95 %  07/01/18 2030 136/71 99.2 F (37.3 C) Oral 78 18 91 %  07/01/18 1909 (!) 104/41 98.3 F (36.8 C) Oral 70 18 94 %     Recent laboratory studies: No results for input(s): WBC, HGB, HCT, PLT, NA, K, CL, CO2, BUN, CREATININE, GLUCOSE, INR, CALCIUM in the last 72 hours.  Invalid input(s): PT, 2   Discharge Medications:   Allergies as of 07/02/2018      Reactions   Lipitor [atorvastatin] Other (See Comments)   muscle spasms cramps   Methylprednisolone Other (See Comments)   cramps cramps   Other    If patient is to receive blood - blood must be treated witgh radiation because his body will not accept a normal infusion  Doxycycline Rash   Doxycycline caused itching redness on scalp and head Doxycycline caused itching redness on scalp and head      Medication List    STOP taking these medications   naproxen sodium 220 MG tablet Commonly known as:  ALEVE     TAKE these medications   ALPRAZolam 0.25 MG tablet Commonly known as:  XANAX Take 1 tablet (0.25 mg total) by mouth 2 (two) times daily as needed for anxiety. What changed:  when to take this   aspirin 325 MG EC tablet Take 1 tablet (325 mg total) by mouth 2 (two) times daily after a meal. What changed:    medication strength  how much to take  when to take this   b complex vitamins tablet Take 1 tablet by mouth at bedtime.    bisacodyl 5 MG EC tablet Commonly known as:  DULCOLAX Take 1 tablet (5 mg total) by mouth daily as needed for moderate constipation.   carvedilol 6.25 MG tablet Commonly known as:  COREG Take 12.5 mg by mouth at bedtime.   docusate sodium 100 MG capsule Commonly known as:  COLACE Take 1 capsule (100 mg total) by mouth 2 (two) times daily.   Fish Oil 1200 MG Caps Take 1,200 mg by mouth at bedtime. 360 MG OMEGA-3   Fluticasone-Salmeterol 100-50 MCG/DOSE Aepb Commonly known as:  ADVAIR Inhale 1 puff into the lungs daily.   LEG CRAMPS PM Subl Place 1 tablet under the tongue at bedtime.   levothyroxine 200 MCG tablet Commonly known as:  SYNTHROID, LEVOTHROID Take 200 mcg by mouth at bedtime.   losartan 50 MG tablet Commonly known as:  COZAAR Take 50 mg by mouth at bedtime.   omeprazole 40 MG capsule Commonly known as:  PRILOSEC Take 40 mg by mouth at bedtime.   tiZANidine 4 MG tablet Commonly known as:  ZANAFLEX Take 1 tablet (4 mg total) by mouth every 6 (six) hours as needed for muscle spasms.   traMADol 50 MG tablet Commonly known as:  ULTRAM Take 1-2 tablets (50-100 mg total) by mouth every 6 (six) hours as needed for moderate pain or severe pain.   Vitamin D3 2000 units capsule Take 2,000 Units by mouth at bedtime.            Durable Medical Equipment  (From admission, onward)        Start     Ordered   06/29/18 1403  DME Walker rolling  Once    Question:  Patient needs a walker to treat with the following condition  Answer:  Primary osteoarthritis of right hip   06/29/18 1402   06/29/18 1403  DME 3 n 1  Once     06/29/18 1402   06/29/18 1403  DME Bedside commode  Once    Question:  Patient needs a bedside commode to treat with the following condition  Answer:  Primary osteoarthritis of right hip   06/29/18 1402      Diagnostic Studies: Dg Chest 2 View  Result Date: 06/17/2018 CLINICAL DATA:  Preoperative respiratory exam for hip replacement.  Smoking history. EXAM: CHEST - 2 VIEW COMPARISON:  04/09/2017 FINDINGS: Previous median sternotomy and CABG. Heart size is normal. Aortic atherosclerosis and tortuosity. The lungs are clear. The vascularity is normal. No effusions. Ordinary degenerative changes affect the spine. IMPRESSION: No active disease.  Previous CABG.  Aortic atherosclerosis. Electronically Signed   By: Nelson Chimes M.D.   On: 06/17/2018 16:00   Dg  C-arm 1-60 Min-no Report  Result Date: 06/29/2018 Fluoroscopy was utilized by the requesting physician.  No radiographic interpretation.   Dg Hip Operative Unilat W Or W/o Pelvis Right  Result Date: 06/29/2018 CLINICAL DATA:  Hip replacement EXAM: OPERATIVE right HIP (WITH PELVIS IF PERFORMED) 2 VIEWS TECHNIQUE: Fluoroscopic spot image(s) were submitted for interpretation post-operatively. COMPARISON:  None. FINDINGS: Frontal projection of the right hip on 2 spot fluoro images shows the patient to be status post joint replacement. No evidence for immediate hardware complications. IMPRESSION: Intraoperative assessment during right hip replacement. Electronically Signed   By: Misty Stanley M.D.   On: 06/29/2018 12:35    Disposition: Discharge disposition: 01-Home or Self Care       Discharge Instructions    Call MD / Call 911   Complete by:  As directed    If you experience chest pain or shortness of breath, CALL 911 and be transported to the hospital emergency room.  If you develope a fever above 101 F, pus (white drainage) or increased drainage or redness at the wound, or calf pain, call your surgeon's office.   Call MD / Call 911   Complete by:  As directed    If you experience chest pain or shortness of breath, CALL 911 and be transported to the hospital emergency room.  If you develope a fever above 101 F, pus (white drainage) or increased drainage or redness at the wound, or calf pain, call your surgeon's office.   Constipation Prevention   Complete by:  As directed     Drink plenty of fluids.  Prune juice may be helpful.  You may use a stool softener, such as Colace (over the counter) 100 mg twice a day.  Use MiraLax (over the counter) for constipation as needed.   Constipation Prevention   Complete by:  As directed    Drink plenty of fluids.  Prune juice may be helpful.  You may use a stool softener, such as Colace (over the counter) 100 mg twice a day.  Use MiraLax (over the counter) for constipation as needed.   Diet - low sodium heart healthy   Complete by:  As directed    Diet - low sodium heart healthy   Complete by:  As directed    Discharge instructions   Complete by:  As directed    INSTRUCTIONS AFTER JOINT REPLACEMENT   Remove items at home which could result in a fall. This includes throw rugs or furniture in walking pathways ICE to the affected joint every three hours while awake for 30 minutes at a time, for at least the first 3-5 days, and then as needed for pain and swelling.  Continue to use ice for pain and swelling. You may notice swelling that will progress down to the foot and ankle.  This is normal after surgery.  Elevate your leg when you are not up walking on it.   Continue to use the breathing machine you got in the hospital (incentive spirometer) which will help keep your temperature down.  It is common for your temperature to cycle up and down following surgery, especially at night when you are not up moving around and exerting yourself.  The breathing machine keeps your lungs expanded and your temperature down.   DIET:  As you were doing prior to hospitalization, we recommend a well-balanced diet.  DRESSING / WOUND CARE / SHOWERING  You may shower 3 days after surgery, but keep the wounds dry during showering.  You may use an occlusive plastic wrap (Press'n Seal for example), NO SOAKING/SUBMERGING IN THE BATHTUB.  If the bandage gets wet, change with a clean dry gauze.  If the incision gets wet, pat the wound dry with a clean  towel.  ACTIVITY  Increase activity slowly as tolerated, but follow the weight bearing instructions below.   No driving for 6 weeks or until further direction given by your physician.  You cannot drive while taking narcotics.  No lifting or carrying greater than 10 lbs. until further directed by your surgeon. Avoid periods of inactivity such as sitting longer than an hour when not asleep. This helps prevent blood clots.  You may return to work once you are authorized by your doctor.     WEIGHT BEARING   Weight bearing as tolerated with assist device (walker, cane, etc) as directed, use it as long as suggested by your surgeon or therapist, typically at least 4-6 weeks.   EXERCISES  Results after joint replacement surgery are often greatly improved when you follow the exercise, range of motion and muscle strengthening exercises prescribed by your doctor. Safety measures are also important to protect the joint from further injury. Any time any of these exercises cause you to have increased pain or swelling, decrease what you are doing until you are comfortable again and then slowly increase them. If you have problems or questions, call your caregiver or physical therapist for advice.   Rehabilitation is important following a joint replacement. After just a few days of immobilization, the muscles of the leg can become weakened and shrink (atrophy).  These exercises are designed to build up the tone and strength of the thigh and leg muscles and to improve motion. Often times heat used for twenty to thirty minutes before working out will loosen up your tissues and help with improving the range of motion but do not use heat for the first two weeks following surgery (sometimes heat can increase post-operative swelling).   These exercises can be done on a training (exercise) mat, on the floor, on a table or on a bed. Use whatever works the best and is most comfortable for you.    Use music or  television while you are exercising so that the exercises are a pleasant break in your day. This will make your life better with the exercises acting as a break in your routine that you can look forward to.   Perform all exercises about fifteen times, three times per day or as directed.  You should exercise both the operative leg and the other leg as well.   Exercises include:   Quad Sets - Tighten up the muscle on the front of the thigh (Quad) and hold for 5-10 seconds.   Straight Leg Raises - With your knee straight (if you were given a brace, keep it on), lift the leg to 60 degrees, hold for 3 seconds, and slowly lower the leg.  Perform this exercise against resistance later as your leg gets stronger.  Leg Slides: Lying on your back, slowly slide your foot toward your buttocks, bending your knee up off the floor (only go as far as is comfortable). Then slowly slide your foot back down until your leg is flat on the floor again.  Angel Wings: Lying on your back spread your legs to the side as far apart as you can without causing discomfort.  Hamstring Strength:  Lying on your back, push your heel against the floor with your leg  straight by tightening up the muscles of your buttocks.  Repeat, but this time bend your knee to a comfortable angle, and push your heel against the floor.  You may put a pillow under the heel to make it more comfortable if necessary.   A rehabilitation program following joint replacement surgery can speed recovery and prevent re-injury in the future due to weakened muscles. Contact your doctor or a physical therapist for more information on knee rehabilitation.    CONSTIPATION  Constipation is defined medically as fewer than three stools per week and severe constipation as less than one stool per week.  Even if you have a regular bowel pattern at home, your normal regimen is likely to be disrupted due to multiple reasons following surgery.  Combination of anesthesia,  postoperative narcotics, change in appetite and fluid intake all can affect your bowels.   YOU MUST use at least one of the following options; they are listed in order of increasing strength to get the job done.  They are all available over the counter, and you may need to use some, POSSIBLY even all of these options:    Drink plenty of fluids (prune juice may be helpful) and high fiber foods Colace 100 mg by mouth twice a day  Senokot for constipation as directed and as needed Dulcolax (bisacodyl), take with full glass of water  Miralax (polyethylene glycol) once or twice a day as needed.  If you have tried all these things and are unable to have a bowel movement in the first 3-4 days after surgery call either your surgeon or your primary doctor.    If you experience loose stools or diarrhea, hold the medications until you stool forms back up.  If your symptoms do not get better within 1 week or if they get worse, check with your doctor.  If you experience "the worst abdominal pain ever" or develop nausea or vomiting, please contact the office immediately for further recommendations for treatment.   ITCHING:  If you experience itching with your medications, try taking only a single pain pill, or even half a pain pill at a time.  You can also use Benadryl over the counter for itching or also to help with sleep.   TED HOSE STOCKINGS:  Use stockings on both legs until for at least 2 weeks or as directed by physician office. They may be removed at night for sleeping.  MEDICATIONS:  See your medication summary on the "After Visit Summary" that nursing will review with you.  You may have some home medications which will be placed on hold until you complete the course of blood thinner medication.  It is important for you to complete the blood thinner medication as prescribed.  PRECAUTIONS:  If you experience chest pain or shortness of breath - call 911 immediately for transfer to the hospital emergency  department.   If you develop a fever greater that 101 F, purulent drainage from wound, increased redness or drainage from wound, foul odor from the wound/dressing, or calf pain - CONTACT YOUR SURGEON.                                                   FOLLOW-UP APPOINTMENTS:  If you do not already have a post-op appointment, please call the office for an appointment to be seen by your  Psychologist, sport and exercise.  Guidelines for how soon to be seen are listed in your "After Visit Summary", but are typically between 1-4 weeks after surgery.  OTHER INSTRUCTIONS:   Knee Replacement:  Do not place pillow under knee, focus on keeping the knee straight while resting. CPM instructions: 0-90 degrees, 2 hours in the morning, 2 hours in the afternoon, and 2 hours in the evening. Place foam block, curve side up under heel at all times except when in CPM or when walking.  DO NOT modify, tear, cut, or change the foam block in any way.  MAKE SURE YOU:  Understand these instructions.  Get help right away if you are not doing well or get worse.    Thank you for letting us be a part of your medical care team.  It is a privilege we respect greatly.  We hope these instructions will help you stay on track for a fast and full recovery!   Discharge instructions   Complete by:  As directed    INSTRUCTIONS AFTER JOINT REPLACEMENT   Remove items at home which could result in a fall. This includes throw rugs or furniture in walking pathways ICE to the affected joint every three hours while awake for 30 minutes at a time, for at least the first 3-5 days, and then as needed for pain and swelling.  Continue to use ice for pain and swelling. You may notice swelling that will progress down to the foot and ankle.  This is normal after surgery.  Elevate your leg when you are not up walking on it.   Continue to use the breathing machine you got in the hospital (incentive spirometer) which will help keep your temperature down.  It is common for your  temperature to cycle up and down following surgery, especially at night when you are not up moving around and exerting yourself.  The breathing machine keeps your lungs expanded and your temperature down.   DIET:  As you were doing prior to hospitalization, we recommend a well-balanced diet.  DRESSING / WOUND CARE / SHOWERING  You may shower 3 days after surgery, but keep the wounds dry during showering.  You may use an occlusive plastic wrap (Press'n Seal for example), NO SOAKING/SUBMERGING IN THE BATHTUB.  If the bandage gets wet, change with a clean dry gauze.  If the incision gets wet, pat the wound dry with a clean towel.  ACTIVITY  Increase activity slowly as tolerated, but follow the weight bearing instructions below.   No driving for 6 weeks or until further direction given by your physician.  You cannot drive while taking narcotics.  No lifting or carrying greater than 10 lbs. until further directed by your surgeon. Avoid periods of inactivity such as sitting longer than an hour when not asleep. This helps prevent blood clots.  You may return to work once you are authorized by your doctor.     WEIGHT BEARING   Weight bearing as tolerated with assist device (walker, cane, etc) as directed, use it as long as suggested by your surgeon or therapist, typically at least 4-6 weeks.   EXERCISES  Results after joint replacement surgery are often greatly improved when you follow the exercise, range of motion and muscle strengthening exercises prescribed by your doctor. Safety measures are also important to protect the joint from further injury. Any time any of these exercises cause you to have increased pain or swelling, decrease what you are doing until you are comfortable again and  then slowly increase them. If you have problems or questions, call your caregiver or physical therapist for advice.   Rehabilitation is important following a joint replacement. After just a few days of  immobilization, the muscles of the leg can become weakened and shrink (atrophy).  These exercises are designed to build up the tone and strength of the thigh and leg muscles and to improve motion. Often times heat used for twenty to thirty minutes before working out will loosen up your tissues and help with improving the range of motion but do not use heat for the first two weeks following surgery (sometimes heat can increase post-operative swelling).   These exercises can be done on a training (exercise) mat, on the floor, on a table or on a bed. Use whatever works the best and is most comfortable for you.    Use music or television while you are exercising so that the exercises are a pleasant break in your day. This will make your life better with the exercises acting as a break in your routine that you can look forward to.   Perform all exercises about fifteen times, three times per day or as directed.  You should exercise both the operative leg and the other leg as well.   Exercises include:   Quad Sets - Tighten up the muscle on the front of the thigh (Quad) and hold for 5-10 seconds.   Straight Leg Raises - With your knee straight (if you were given a brace, keep it on), lift the leg to 60 degrees, hold for 3 seconds, and slowly lower the leg.  Perform this exercise against resistance later as your leg gets stronger.  Leg Slides: Lying on your back, slowly slide your foot toward your buttocks, bending your knee up off the floor (only go as far as is comfortable). Then slowly slide your foot back down until your leg is flat on the floor again.  Angel Wings: Lying on your back spread your legs to the side as far apart as you can without causing discomfort.  Hamstring Strength:  Lying on your back, push your heel against the floor with your leg straight by tightening up the muscles of your buttocks.  Repeat, but this time bend your knee to a comfortable angle, and push your heel against the floor.  You  may put a pillow under the heel to make it more comfortable if necessary.   A rehabilitation program following joint replacement surgery can speed recovery and prevent re-injury in the future due to weakened muscles. Contact your doctor or a physical therapist for more information on knee rehabilitation.    CONSTIPATION  Constipation is defined medically as fewer than three stools per week and severe constipation as less than one stool per week.  Even if you have a regular bowel pattern at home, your normal regimen is likely to be disrupted due to multiple reasons following surgery.  Combination of anesthesia, postoperative narcotics, change in appetite and fluid intake all can affect your bowels.   YOU MUST use at least one of the following options; they are listed in order of increasing strength to get the job done.  They are all available over the counter, and you may need to use some, POSSIBLY even all of these options:    Drink plenty of fluids (prune juice may be helpful) and high fiber foods Colace 100 mg by mouth twice a day  Senokot for constipation as directed and as needed Dulcolax (bisacodyl),  take with full glass of water  Miralax (polyethylene glycol) once or twice a day as needed.  If you have tried all these things and are unable to have a bowel movement in the first 3-4 days after surgery call either your surgeon or your primary doctor.    If you experience loose stools or diarrhea, hold the medications until you stool forms back up.  If your symptoms do not get better within 1 week or if they get worse, check with your doctor.  If you experience "the worst abdominal pain ever" or develop nausea or vomiting, please contact the office immediately for further recommendations for treatment.   ITCHING:  If you experience itching with your medications, try taking only a single pain pill, or even half a pain pill at a time.  You can also use Benadryl over the counter for itching or  also to help with sleep.   TED HOSE STOCKINGS:  Use stockings on both legs until for at least 2 weeks or as directed by physician office. They may be removed at night for sleeping.  MEDICATIONS:  See your medication summary on the "After Visit Summary" that nursing will review with you.  You may have some home medications which will be placed on hold until you complete the course of blood thinner medication.  It is important for you to complete the blood thinner medication as prescribed.  PRECAUTIONS:  If you experience chest pain or shortness of breath - call 911 immediately for transfer to the hospital emergency department.   If you develop a fever greater that 101 F, purulent drainage from wound, increased redness or drainage from wound, foul odor from the wound/dressing, or calf pain - CONTACT YOUR SURGEON.                                                   FOLLOW-UP APPOINTMENTS:  If you do not already have a post-op appointment, please call the office for an appointment to be seen by your surgeon.  Guidelines for how soon to be seen are listed in your "After Visit Summary", but are typically between 1-4 weeks after surgery.  OTHER INSTRUCTIONS:   Knee Replacement:  Do not place pillow under knee, focus on keeping the knee straight while resting. CPM instructions: 0-90 degrees, 2 hours in the morning, 2 hours in the afternoon, and 2 hours in the evening. Place foam block, curve side up under heel at all times except when in CPM or when walking.  DO NOT modify, tear, cut, or change the foam block in any way.  MAKE SURE YOU:  Understand these instructions.  Get help right away if you are not doing well or get worse.    Thank you for letting us be a part of your medical care team.  It is a privilege we respect greatly.  We hope these instructions will help you stay on track for a fast and full recovery!   Increase activity slowly as tolerated   Complete by:  As directed    Increase activity  slowly as tolerated   Complete by:  As directed       Follow-up Information    Melrose Nakayama, MD. Schedule an appointment as soon as possible for a visit in 2 weeks.   Specialty:  Orthopedic Surgery Contact information: [place].  [place] [address] [phone]        [facility].   Specialty:  [place] Why:  physical therapy Contact information: [address] [place] [address] [phone]            Signed: Rich Fuchs 07/02/2018, 8:02 AM

## 2018-11-30 NOTE — Progress Notes (Signed)
Jeffrey [name] was seen today in the movement disorders clinic for neurologic consultation at the request of [name], MD.  The consultation is for the evaluation of parkinsonism.  Patient apparently previously did see a neurologist (actually 2 different neuro practices).  I do not have any of those records.  This patient is accompanied in the office by his spouse who supplements the history. Was seen in [place] and in [place].  In [place], they just saw the NP per pt/wife.  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  Patient presents today for another opinion.  Pt report that first sx started 6-9 months ago and was tremor in the L hand that is now in the R hand.  He started on carbidopa/levodopa 25/100, 1 tablet 3 times per day and October, 2018.  They were on this for less than a month and did not feel that it was helpful. In  December, 2018 he was started on Cogentin.  This was 1 mg twice per day.  In January, 2019 he was given carbidopa/levodopa 25/250, 1 tablet 3 times per day.  He did not take that long because he did not find it effective.  He then switched practices and was started on Neurontin ropinirole in October, 2019 and worked up to 2 mg 3 times per day.  Most recently, he was started on primdone 50 mg, 2 po tid  Tremor: Yes.     How long has it been going on? 6-9 months  At rest or with activation?  Rest (hands on steering wheel)  Fam hx of tremor?  No.  Tremor inducing meds:  No..    Other Specific Symptoms:  Voice: getting softer Sleep: sleeps well  Vivid Dreams:  Yes.    Acting out dreams:  Yes.   - wife states that "when he starts hitting I move fast." Wet Pillows: Yes.   Postural symptoms:  Yes.    Falls?  No. Bradykinesia symptoms: shuffling gait, slow movements, difficulty getting out of a chair and describes festinating  gait Loss of smell:  Yes.   Loss of taste:  No. Urinary Incontinence:  No. Difficulty Swallowing:  No. Handwriting, micrographia: Yes.   Trouble with ADL's:  No.  Trouble buttoning clothing: No. Depression:  No. per pt; wife states that he has a "short fuse" Memory changes:  Yes.    (trouble with names; able to remember pills; wife has always done monthly bills) Hallucinations:  No.  visual distortions: No. N/V:  No. Lightheaded:  No.  Syncope: No. Diplopia:  No. Dyskinesia:  No.  Neuroimaging of the brain has previously been performed.  It is not available for my review today.  PREVIOUS MEDICATIONS: requip, primidone, leovodopa, cogentin  ALLERGIES:   Allergies  Allergen Reactions  . Lipitor [Atorvastatin] Other (See Comments)    muscle spasms cramps  . Methylprednisolone Other (See Comments)    cramps cramps  . Other     If patient is to receive blood - blood must be treated witgh radiation because his body will not accept a normal infusion   . Doxycycline Rash    Doxycycline caused itching redness on scalp and head Doxycycline caused itching redness on scalp and head    CURRENT MEDICATIONS:  Outpatient Encounter Medications as of 12/02/2018  Medication Sig  . ALPRAZolam (XANAX) 0.25 MG tablet Take 1 tablet (0.25 mg total)  by mouth 2 (two) times daily as needed for anxiety.  [name] Kitchen aspirin EC 325 MG EC tablet Take 1 tablet (325 mg total) by mouth 2 (two) times daily after a meal.  . b complex vitamins tablet Take 1 tablet by mouth at bedtime.  . bisacodyl (DULCOLAX) 5 MG EC tablet Take 1 tablet (5 mg total) by mouth daily as needed for moderate constipation.  . carvedilol (COREG) 6.25 MG tablet Take 12.5 mg by mouth at bedtime.   . Cholecalciferol (VITAMIN D3) 2000 units capsule Take 2,000 Units by mouth at bedtime.  . docusate sodium (COLACE) 100 MG capsule Take 1 capsule (100 mg total) by mouth 2 (two) times daily.  . Fluticasone-Salmeterol (ADVAIR) 100-50 MCG/DOSE AEPB  Inhale 1 puff into the lungs daily.  . Homeopathic Products (LEG CRAMPS PM) SUBL Place 1 tablet under the tongue at bedtime.  [name] Kitchen levothyroxine (SYNTHROID, LEVOTHROID) 200 MCG tablet Take 200 mcg by mouth at bedtime.   [name] Kitchen losartan (COZAAR) 50 MG tablet Take 50 mg by mouth at bedtime.  . Omega-3 Fatty Acids (FISH OIL) 1200 MG CAPS Take 1,200 mg by mouth at bedtime. 360 MG OMEGA-3  . omeprazole (PRILOSEC) 40 MG capsule Take 40 mg by mouth at bedtime.   [name] Kitchen tiZANidine (ZANAFLEX) 4 MG tablet Take 1 tablet (4 mg total) by mouth every 6 (six) hours as needed for muscle spasms.  . traMADol (ULTRAM) 50 MG tablet Take 1-2 tablets (50-100 mg total) by mouth every 6 (six) hours as needed for moderate pain or severe pain.   No facility-administered encounter medications on file as of 12/02/2018.     PAST MEDICAL HISTORY:   Past Medical History:  Diagnosis Date  . Anxiety   . Aortic stenosis    mild AS by 07/20/13 echo ([facility])  . Arthritis    "all over"  . Coronary artery disease   . Depression   . GERD (gastroesophageal reflux disease)   . H/O hiatal hernia   . Hearing aid worn    B/L  . High cholesterol   . History of blood transfusion reaction 03/06/2013   "he almost died; he has to get irradiated blood next time"  . Hypertension   . Hypothyroidism   . Ischemic cardiomyopathy   . ITP (idiopathic thrombocytopenic purpura)    Dr. Gaynelle Arabian, on [place]  . Myocardial infarction ([place]) March 06, 2010  . Pneumonia 1940's X 1; 03/06/14 X 1  . Shortness of breath    with exertion, has not been very active  . Sleep apnea   . Wears glasses   . Wears partial dentures     PAST SURGICAL HISTORY:   Past Surgical History:  Procedure Laterality Date  . APPENDECTOMY    . BACK SURGERY    . CATARACT EXTRACTION, BILATERAL    . COLONOSCOPY    . CORONARY ANGIOPLASTY WITH STENT PLACEMENT     "think I have 3-4 stents" (06/22/2014)  . CORONARY ARTERY BYPASS GRAFT  2010-03-06   "CABG X3"  . HERNIA REPAIR      . HIATAL HERNIA REPAIR    . KNEE ARTHROSCOPY Left 06/22/2014   w/I&D  . KNEE ARTHROSCOPY Left 06/22/2014   Procedure: [place];  Surgeon: Hessie Dibble, MD;  Location: [facility];  Service: Orthopedics;  Laterality: Left;  . LUMBAR DISC SURGERY  1960's?  . MULTIPLE TOOTH EXTRACTIONS    . SHOULDER ARTHROSCOPY Right 04/09/2017   Procedure: ARTHROSCOPY SHOULDER;  Surgeon: Melrose Nakayama, MD;  Location: [facility];  Service: Orthopedics;  Laterality: Right;  . STERNAL CLOSURE     "wires from OHS taken out; plate put in" (08/26/2840)  . SYNOVECTOMY Left 08/17/2014   Procedure: SYNOVECTOMY LEFT KNEE;  Surgeon: Hessie Dibble, MD;  Location: [facility];  Service: Orthopedics;  Laterality: Left;  . TOTAL HIP ARTHROPLASTY Left   . TOTAL HIP ARTHROPLASTY Right 06/29/2018   Procedure: RIGHT TOTAL HIP ARTHROPLASTY ANTERIOR APPROACH;  Surgeon: Melrose Nakayama, MD;  Location: [facility];  Service: Orthopedics;  Laterality: Right;  . TOTAL KNEE ARTHROPLASTY Left 11/24/2014   Procedure: TOTAL KNEE ARTHROPLASTY;  Surgeon: Hessie Dibble, MD;  Location: [facility];  Service: Orthopedics;  Laterality: Left;    SOCIAL HISTORY:   Social History   Socioeconomic History  . Marital status: Married    Spouse name: Not on file  . Number of children: Not on file  . Years of education: 12+  . Highest education level: Not on file  Occupational History  . Not on file  Social Needs  . Financial resource strain: Not on file  . Food insecurity:    Worry: Not on file    Inability: Not on file  . Transportation needs:    Medical: Not on file    Non-medical: Not on file  Tobacco Use  . Smoking status: Former Smoker    Packs/day: 1.00    Years: 15.00    Pack years: 15.00    Types: Cigarettes    Last attempt to quit: 12/22/1966    Years since quitting: 51.9  . Smokeless tobacco: Former Systems developer    Types: Chew  . Tobacco comment: "quit smoking ~ late ~ 60's; quit chewing in the 1970's"  Substance  and Sexual Activity  . Alcohol use: No  . Drug use: No  . Sexual activity: Not Currently  Lifestyle  . Physical activity:    Days per week: Not on file    Minutes per session: Not on file  . Stress: Not on file  Relationships  . Social connections:    Talks on phone: Not on file    Gets together: Not on file    Attends religious service: Not on file    Active member of club or organization: Not on file    Attends meetings of clubs or organizations: Not on file    Relationship status: Not on file  . Intimate partner violence:    Fear of current or ex partner: Not on file    Emotionally abused: Not on file    Physically abused: Not on file    Forced sexual activity: Not on file  Other Topics Concern  . Not on file  Social History Narrative  . Not on file    FAMILY HISTORY:   Family Status  Relation Name Status  . Other  (Not Specified)  . Other  (Not Specified)  . Mother  Deceased  . Father  Deceased    ROS:  Review of Systems  Constitutional: Negative.   HENT: Negative.   Eyes: Negative.   Respiratory: Positive for shortness of breath (with exertion).   Cardiovascular: Negative.   Gastrointestinal: Negative.   Genitourinary: Negative.   Musculoskeletal: Negative.   Skin: Negative.   Endo/Heme/Allergies: Negative.     PHYSICAL EXAMINATION:    VITALS:  There were no vitals filed for this visit.  GEN:  The patient appears stated age and is in NAD. HEENT:  Normocephalic, atraumatic.  The mucous membranes are moist. The  superficial temporal arteries are without ropiness or tenderness. CV:  RRR Lungs:  CTAB Neck/HEME:  There are no carotid bruits bilaterally.  Neurological examination:  Orientation: The patient is alert and oriented x3. Fund of knowledge is appropriate.  Recent and remote memory are intact.  Attention and concentration are normal.    Able to name objects and repeat phrases. Cranial nerves: There is good facial symmetry. There is facial  hypomimia.  Pupils are equal round and reactive to light bilaterally. Fundoscopic exam reveals clear margins bilaterally. Extraocular muscles are intact. The visual fields are full to confrontational testing. The speech is fluent and just slightly hypophonic and dysarthric. Soft palate rises symmetrically and there is no tongue deviation. Hearing is intact to conversational tone. Sensation: Sensation is intact to light and pinprick throughout (facial, trunk, extremities). Vibration is decreased at the bilateral big toe. There is no extinction with double simultaneous stimulation. There is no sensory dermatomal level identified. Motor: Strength is 5/5 in the bilateral upper and lower extremities.   Shoulder shrug is equal and symmetric.  There is no pronator drift. Deep tendon reflexes: Deep tendon reflexes are 2-/4 at the bilateral biceps, triceps, brachioradialis, patella and achilles. Plantar responses are downgoing bilaterally.  Movement examination: Tone: There is mild increased tone in the bilateral UE with activation procedures.   Abnormal movements: Has bilateral upper extremity resting tremor, left greater than right, that increases with distraction procedures. Coordination:  There is  decremation with RAM's, with any form of RAMS, including alternating supination and pronation of the forearm, hand opening and closing, finger taps, heel taps and toe taps, L greater than right Gait and Station: The patient has no difficulty arising out of a deep-seated chair without the use of the hands. The patient's stride length is slightly decreased with decreased arm swing bilaterally.  The patient has a negative pull test.      ASSESSMENT/PLAN:  1.  Idiopathic tremor predominant Parkinson's disease  -We discussed the diagnosis as well as pathophysiology of the disease.  We discussed treatment options as well as prognostic indicators.  Patient education was provided.  -We discussed that it used to be  thought that levodopa would increase risk of melanoma but now it is believed that Parkinsons itself likely increases risk of melanoma. he is to get regular skin checks.  -Greater than 50% of the 60 minute visit was spent in counseling answering questions and talking about what to expect now as well as in the future.  We talked about medication options as well as potential future surgical options.  We talked about safety in the home.  -Long discussion about the concept of levodopa resistant tremor.  He has tried many medications and thinks that they have not been effective.  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    -We decided to restart carbidopa/levodopa 25/100.  1/2 tab tid x 1 wk, then 1/2 in am & noon & 1 at night for a week, then 1/2 in am &1 at noon &night for a week, then 1 po tid.  Risks, benefits, side effects and alternative therapies were discussed.  The opportunity to ask questions was given and they were answered to the best of my ability.  The patient expressed understanding and willingness to follow the outlined treatment protocols.  -wean off of the primidone  -RX for PT in his area  2.  Follow up is anticipated  in the next 4-5 months, sooner should new neurologic issues arise.   Cc:  [name], MD

## 2018-12-02 ENCOUNTER — Ambulatory Visit (INDEPENDENT_AMBULATORY_CARE_PROVIDER_SITE_OTHER): Payer: Medicare Other | Admitting: Neurology

## 2018-12-02 ENCOUNTER — Encounter: Payer: Self-pay | Admitting: Neurology

## 2018-12-02 VITALS — BP 110/70 | HR 66 | Ht 72.0 in | Wt 232.0 lb

## 2018-12-02 DIAGNOSIS — G20 Parkinson's disease: Secondary | ICD-10-CM

## 2018-12-02 MED ORDER — CARBIDOPA-LEVODOPA 25-100 MG PO TABS: 1.0000 | ORAL_TABLET | Freq: Three times a day (TID) | ORAL | 1 refills | Status: DC

## 2018-12-02 NOTE — Addendum Note (Signed)
Addended byMargarette Asal L on: 12/02/2018 10:20 AM   Modules accepted: Orders

## 2018-12-02 NOTE — Patient Instructions (Signed)
1.  Wean off of primidone - 50 mg - 1 tablet twice a day for a week, then 1 tablet once a day for a week and then stop the primidone  2.  Start Carbidopa Levodopa as follows:  Take 1/2 tablet three times daily, at least 30 minutes before meals, for one week  Then take 1/2 tablet in the morning, 1/2 tablet in the afternoon, 1 tablet in the evening, at least 30 minutes before meals, for one week  Then take 1/2 tablet in the morning, 1 tablet in the afternoon, 1 tablet in the evening, at least 30 minutes before meals, for one week  Then take 1 tablet three times daily, at least 30 minutes before meals

## 2019-04-21 ENCOUNTER — Ambulatory Visit: Payer: Medicare Other | Admitting: Neurology

## 2019-04-28 ENCOUNTER — Telehealth: Payer: Self-pay | Admitting: Neurology

## 2019-04-28 NOTE — Telephone Encounter (Signed)
Does he know what that is for?  Its a drug for hallucinations.  As for as I know he doesn't have hallucinations.  I have only seen this patient one time but he denied that sx when I saw him.  Has something changed?  He does need an appointment.  I saw he declined evisit but tell him I would like him to schedule one.  Once our offices open, things will be very backed up and I am trying to keep doing evisits for patients of this age group (and right now all visits unless urgents are evisits).

## 2019-04-28 NOTE — Telephone Encounter (Signed)
Patient called in wanting to try a new medication- the Nuplazid and he wants this sent to [facility] in [place] if you say it is okay. Thanks!

## 2019-04-29 NOTE — Telephone Encounter (Signed)
Called and talk to pt wife--stated pt seen Nuplazid on TV. For parkinson's disease. Notified pt wife nuplazid-is for hallucination and pt  Wife stated --pt does not have symptoms of hallucination. Pt have an appt.05/31/19 @10 :15 with Dr. Carles Collet.

## 2019-05-14 ENCOUNTER — Encounter (HOSPITAL_COMMUNITY): Payer: Self-pay | Admitting: Emergency Medicine

## 2019-05-14 ENCOUNTER — Emergency Department (HOSPITAL_COMMUNITY): Payer: Medicare Other

## 2019-05-14 ENCOUNTER — Other Ambulatory Visit: Payer: Self-pay

## 2019-05-14 ENCOUNTER — Inpatient Hospital Stay (HOSPITAL_COMMUNITY)
Admission: EM | Admit: 2019-05-14 | Discharge: 2019-05-28 | DRG: 829 | Disposition: A | Discharge status: Home Health | Payer: Medicare Other | Attending: Family Medicine | Admitting: Family Medicine

## 2019-05-14 DIAGNOSIS — N390 Urinary tract infection, site not specified: Secondary | ICD-10-CM | POA: Diagnosis not present

## 2019-05-14 DIAGNOSIS — Z9689 Presence of other specified functional implants: Secondary | ICD-10-CM

## 2019-05-14 DIAGNOSIS — C457 Mesothelioma of other sites: Principal | ICD-10-CM | POA: Diagnosis present

## 2019-05-14 DIAGNOSIS — J90 Pleural effusion, not elsewhere classified: Secondary | ICD-10-CM

## 2019-05-14 DIAGNOSIS — E7800 Pure hypercholesterolemia, unspecified: Secondary | ICD-10-CM | POA: Diagnosis not present

## 2019-05-14 DIAGNOSIS — K449 Diaphragmatic hernia without obstruction or gangrene: Secondary | ICD-10-CM | POA: Diagnosis present

## 2019-05-14 DIAGNOSIS — Z8249 Family history of ischemic heart disease and other diseases of the circulatory system: Secondary | ICD-10-CM

## 2019-05-14 DIAGNOSIS — Z20828 Contact with and (suspected) exposure to other viral communicable diseases: Secondary | ICD-10-CM | POA: Diagnosis not present

## 2019-05-14 DIAGNOSIS — I2583 Coronary atherosclerosis due to lipid rich plaque: Secondary | ICD-10-CM | POA: Diagnosis not present

## 2019-05-14 DIAGNOSIS — E039 Hypothyroidism, unspecified: Secondary | ICD-10-CM | POA: Diagnosis not present

## 2019-05-14 DIAGNOSIS — Z96652 Presence of left artificial knee joint: Secondary | ICD-10-CM | POA: Diagnosis not present

## 2019-05-14 DIAGNOSIS — Z9889 Other specified postprocedural states: Secondary | ICD-10-CM

## 2019-05-14 DIAGNOSIS — Z7982 Long term (current) use of aspirin: Secondary | ICD-10-CM

## 2019-05-14 DIAGNOSIS — K5900 Constipation, unspecified: Secondary | ICD-10-CM | POA: Diagnosis not present

## 2019-05-14 DIAGNOSIS — Z419 Encounter for procedure for purposes other than remedying health state, unspecified: Secondary | ICD-10-CM

## 2019-05-14 DIAGNOSIS — R0602 Shortness of breath: Secondary | ICD-10-CM | POA: Diagnosis not present

## 2019-05-14 DIAGNOSIS — I214 Non-ST elevation (NSTEMI) myocardial infarction: Secondary | ICD-10-CM | POA: Diagnosis not present

## 2019-05-14 DIAGNOSIS — Z888 Allergy status to other drugs, medicaments and biological substances status: Secondary | ICD-10-CM

## 2019-05-14 DIAGNOSIS — Z8261 Family history of arthritis: Secondary | ICD-10-CM

## 2019-05-14 DIAGNOSIS — Z77090 Contact with and (suspected) exposure to asbestos: Secondary | ICD-10-CM | POA: Diagnosis not present

## 2019-05-14 DIAGNOSIS — I252 Old myocardial infarction: Secondary | ICD-10-CM

## 2019-05-14 DIAGNOSIS — Z862 Personal history of diseases of the blood and blood-forming organs and certain disorders involving the immune mechanism: Secondary | ICD-10-CM

## 2019-05-14 DIAGNOSIS — D649 Anemia, unspecified: Secondary | ICD-10-CM | POA: Diagnosis present

## 2019-05-14 DIAGNOSIS — G20 Parkinson's disease: Secondary | ICD-10-CM | POA: Diagnosis present

## 2019-05-14 DIAGNOSIS — Z955 Presence of coronary angioplasty implant and graft: Secondary | ICD-10-CM | POA: Diagnosis not present

## 2019-05-14 DIAGNOSIS — F419 Anxiety disorder, unspecified: Secondary | ICD-10-CM | POA: Diagnosis not present

## 2019-05-14 DIAGNOSIS — J449 Chronic obstructive pulmonary disease, unspecified: Secondary | ICD-10-CM | POA: Diagnosis present

## 2019-05-14 DIAGNOSIS — I2510 Atherosclerotic heart disease of native coronary artery without angina pectoris: Secondary | ICD-10-CM

## 2019-05-14 DIAGNOSIS — I719 Aortic aneurysm of unspecified site, without rupture: Secondary | ICD-10-CM | POA: Diagnosis present

## 2019-05-14 DIAGNOSIS — I25810 Atherosclerosis of coronary artery bypass graft(s) without angina pectoris: Secondary | ICD-10-CM | POA: Diagnosis not present

## 2019-05-14 DIAGNOSIS — R778 Other specified abnormalities of plasma proteins: Secondary | ICD-10-CM

## 2019-05-14 DIAGNOSIS — I352 Nonrheumatic aortic (valve) stenosis with insufficiency: Secondary | ICD-10-CM | POA: Diagnosis present

## 2019-05-14 DIAGNOSIS — Z79890 Hormone replacement therapy (postmenopausal): Secondary | ICD-10-CM | POA: Diagnosis not present

## 2019-05-14 DIAGNOSIS — Z820 Family history of epilepsy and other diseases of the nervous system: Secondary | ICD-10-CM

## 2019-05-14 DIAGNOSIS — M159 Polyosteoarthritis, unspecified: Secondary | ICD-10-CM | POA: Diagnosis not present

## 2019-05-14 DIAGNOSIS — G2581 Restless legs syndrome: Secondary | ICD-10-CM | POA: Diagnosis present

## 2019-05-14 DIAGNOSIS — K219 Gastro-esophageal reflux disease without esophagitis: Secondary | ICD-10-CM | POA: Diagnosis present

## 2019-05-14 DIAGNOSIS — Z4682 Encounter for fitting and adjustment of non-vascular catheter: Secondary | ICD-10-CM

## 2019-05-14 DIAGNOSIS — K869 Disease of pancreas, unspecified: Secondary | ICD-10-CM | POA: Diagnosis not present

## 2019-05-14 DIAGNOSIS — I255 Ischemic cardiomyopathy: Secondary | ICD-10-CM | POA: Diagnosis not present

## 2019-05-14 DIAGNOSIS — I700 Atherosclerosis of aorta: Secondary | ICD-10-CM | POA: Diagnosis present

## 2019-05-14 DIAGNOSIS — G4733 Obstructive sleep apnea (adult) (pediatric): Secondary | ICD-10-CM | POA: Diagnosis present

## 2019-05-14 DIAGNOSIS — K862 Cyst of pancreas: Secondary | ICD-10-CM

## 2019-05-14 DIAGNOSIS — R001 Bradycardia, unspecified: Secondary | ICD-10-CM | POA: Diagnosis not present

## 2019-05-14 DIAGNOSIS — I10 Essential (primary) hypertension: Secondary | ICD-10-CM | POA: Diagnosis present

## 2019-05-14 DIAGNOSIS — Z7951 Long term (current) use of inhaled steroids: Secondary | ICD-10-CM

## 2019-05-14 DIAGNOSIS — K8020 Calculus of gallbladder without cholecystitis without obstruction: Secondary | ICD-10-CM | POA: Diagnosis present

## 2019-05-14 DIAGNOSIS — Z882 Allergy status to sulfonamides status: Secondary | ICD-10-CM

## 2019-05-14 DIAGNOSIS — M069 Rheumatoid arthritis, unspecified: Secondary | ICD-10-CM | POA: Diagnosis present

## 2019-05-14 DIAGNOSIS — K8080 Other cholelithiasis without obstruction: Secondary | ICD-10-CM

## 2019-05-14 DIAGNOSIS — G4700 Insomnia, unspecified: Secondary | ICD-10-CM | POA: Diagnosis not present

## 2019-05-14 DIAGNOSIS — I25110 Atherosclerotic heart disease of native coronary artery with unstable angina pectoris: Secondary | ICD-10-CM | POA: Diagnosis present

## 2019-05-14 DIAGNOSIS — Z881 Allergy status to other antibiotic agents status: Secondary | ICD-10-CM

## 2019-05-14 DIAGNOSIS — Z87891 Personal history of nicotine dependence: Secondary | ICD-10-CM

## 2019-05-14 DIAGNOSIS — Z96643 Presence of artificial hip joint, bilateral: Secondary | ICD-10-CM | POA: Diagnosis present

## 2019-05-14 DIAGNOSIS — J929 Pleural plaque without asbestos: Secondary | ICD-10-CM

## 2019-05-14 DIAGNOSIS — R0902 Hypoxemia: Secondary | ICD-10-CM | POA: Diagnosis present

## 2019-05-14 HISTORY — DX: Rheumatoid arthritis, unspecified: M06.9

## 2019-05-14 LAB — CBC WITH DIFFERENTIAL/PLATELET
Abs Immature Granulocytes: 0.04 10*3/uL (ref 0.00–0.07)
Basophils Absolute: 0 10*3/uL (ref 0.0–0.1)
Basophils Relative: 0 %
Eosinophils Absolute: 0.1 10*3/uL (ref 0.0–0.5)
Eosinophils Relative: 1 %
HCT: 42.4 % (ref 39.0–52.0)
Hemoglobin: 13.9 g/dL (ref 13.0–17.0)
Immature Granulocytes: 0 %
Lymphocytes Relative: 11 %
Lymphs Abs: 1.1 10*3/uL (ref 0.7–4.0)
MCH: 31.2 pg (ref 26.0–34.0)
MCHC: 32.8 g/dL (ref 30.0–36.0)
MCV: 95.1 fL (ref 80.0–100.0)
Monocytes Absolute: 0.8 10*3/uL (ref 0.1–1.0)
Monocytes Relative: 7 %
Neutro Abs: 8.1 10*3/uL — ABNORMAL HIGH (ref 1.7–7.7)
Neutrophils Relative %: 81 %
Platelets: 302 10*3/uL (ref 150–400)
RBC: 4.46 MIL/uL (ref 4.22–5.81)
RDW: 15.1 % (ref 11.5–15.5)
WBC: 10.1 10*3/uL (ref 4.0–10.5)
nRBC: 0 % (ref 0.0–0.2)

## 2019-05-14 LAB — BASIC METABOLIC PANEL
Anion gap: 14 (ref 5–15)
BUN: 18 mg/dL (ref 8–23)
CO2: 21 mmol/L — ABNORMAL LOW (ref 22–32)
Calcium: 9.3 mg/dL (ref 8.9–10.3)
Chloride: 104 mmol/L (ref 98–111)
Creatinine, Ser: 1.23 mg/dL (ref 0.61–1.24)
GFR calc Af Amer: >60 mL/min (ref >60)
GFR calc non Af Amer: 54 mL/min — ABNORMAL LOW (ref >60)
Glucose, Bld: 142 mg/dL — ABNORMAL HIGH (ref 70–99)
Potassium: 4.6 mmol/L (ref 3.5–5.1)
Sodium: 139 mmol/L (ref 135–145)

## 2019-05-14 LAB — URINALYSIS, ROUTINE W REFLEX MICROSCOPIC
Bilirubin Urine: NEGATIVE
Glucose, UA: NEGATIVE mg/dL
Hgb urine dipstick: NEGATIVE
Ketones, ur: NEGATIVE mg/dL
Leukocytes,Ua: NEGATIVE
Nitrite: NEGATIVE
Protein, ur: NEGATIVE mg/dL
Specific Gravity, Urine: 1.015 (ref 1.005–1.030)
pH: 6 (ref 5.0–8.0)

## 2019-05-14 LAB — PROTIME-INR
INR: 1 (ref 0.8–1.2)
Prothrombin Time: 12.8 seconds (ref 11.4–15.2)

## 2019-05-14 LAB — LACTIC ACID, PLASMA
Lactic Acid, Venous: 1.4 mmol/L (ref 0.5–1.9)
Lactic Acid, Venous: 1.3 mmol/L (ref 0.5–1.9)

## 2019-05-14 LAB — BRAIN NATRIURETIC PEPTIDE: B Natriuretic Peptide: 77 pg/mL (ref 0.0–100.0)

## 2019-05-14 LAB — SARS CORONAVIRUS 2 BY RT PCR (HOSPITAL ORDER, PERFORMED IN ~~LOC~~ HOSPITAL LAB): SARS Coronavirus 2: NEGATIVE

## 2019-05-14 LAB — TROPONIN I: Troponin I: <0.03 ng/mL (ref <0.03)

## 2019-05-14 MED ORDER — HYDRALAZINE HCL 20 MG/ML IJ SOLN: 5.0000 mg | Freq: Four times a day (QID) | INTRAMUSCULAR | Status: DC | PRN

## 2019-05-14 MED ORDER — SODIUM CHLORIDE 0.9% FLUSH: 3.0000 mL | INTRAVENOUS | Status: DC | PRN

## 2019-05-14 MED ORDER — ACETAMINOPHEN 650 MG RE SUPP: 650.0000 mg | Freq: Four times a day (QID) | RECTAL | Status: DC | PRN

## 2019-05-14 MED ORDER — SODIUM CHLORIDE 0.9% FLUSH
3.0000 mL | Freq: Two times a day (BID) | INTRAVENOUS | Status: DC
  Administered 2019-05-15 – 2019-05-18 (×8): 3 mL via INTRAVENOUS

## 2019-05-14 MED ORDER — LORAZEPAM 0.5 MG PO TABS
0.5000 mg | ORAL_TABLET | Freq: Once | ORAL | Status: AC
  Administered 2019-05-14: 0.5 mg via ORAL
  Filled 2019-05-14: qty 1

## 2019-05-14 MED ORDER — ACETAMINOPHEN 325 MG PO TABS
650.0000 mg | ORAL_TABLET | Freq: Four times a day (QID) | ORAL | Status: DC | PRN
  Administered 2019-05-16 (×2): 650 mg via ORAL
  Filled 2019-05-14 (×3): qty 2

## 2019-05-14 MED ORDER — ENOXAPARIN SODIUM 40 MG/0.4ML ~~LOC~~ SOLN
40.0000 mg | SUBCUTANEOUS | Status: DC
  Administered 2019-05-15: 40 mg via SUBCUTANEOUS
  Filled 2019-05-14: qty 0.4

## 2019-05-14 MED ORDER — IOHEXOL 350 MG/ML SOLN
75.0000 mL | Freq: Once | INTRAVENOUS | Status: AC | PRN
  Administered 2019-05-14: 21:00:00 75 mL via INTRAVENOUS

## 2019-05-14 MED ORDER — SODIUM CHLORIDE 0.9 % IV SOLN: 250.0000 mL | INTRAVENOUS | Status: DC | PRN

## 2019-05-14 NOTE — ED Notes (Signed)
Pt ambulated and became very SOB, RR at 32. Oxygen sat at 93%, redirected pt back to room. MD aware.

## 2019-05-14 NOTE — H&P (Addendum)
TRH H&P    Patient Demographics:    Jeffrey [name], is a 85 y.o. male  MRN: [id number]  DOB - [birth year]  Admit Date - 05/14/2019  Referring MD/NP/PA: Francine Graven  Outpatient Primary MD for the patient is [name], MD [facility] - infectious disease Cardiology, Pulmonary, rheumatology in [place], [place]  Patient coming from:  Home in [place], [place]  Chief complaint- dyspnea   HPI:    Jeffrey [name]  is a 85 y.o. male, w CAD s/p CABG, ischemic CM,  mild Aortic stenosis, Asthma (mild), OSA, Hypothyroidism,  Parkinsons, Anxiety, remote hx of MRSE septic arthritis left knee s/p L knee ID/ synovectomy, L THA, Anemia/ ITP apparently presents with c/o cough with white->slight yellow sputum since January of this year.  Pt notes dyspnea for the past 4 weeks. Worse over the past 3-4 days. Pt is not certain if lying down makes his breathing worse but exertion certainly does. Pt recently on zithromax for dental work?  Pt worked for [employer] and has known asbestos exposure  No h/o wretching.  Pt denies fever, chills, cp, palp, n/v, abd pain, diarrhea, brbpr, black stool.  Pt was told has fluid on his lung by physician in [place] and told to go to ER for evaluation.   In ED,  T 97.9 P 69 R 17, Bp 159/98  Pox 92% on RA Wt 99.8kg  CT chest IMPRESSION: 1. No PE. 2. Large, likely somewhat loculated, right-sided pleural effusion with near complete collapse of the right lower lobe. 3. Multiple calcified pleural based plaques are noted on the right. There is some mild pleural thickening towards the right lung apex. Findings can be seen in patients with prior asbestos exposure. 4. Ectatic thoracic aorta measuring approximately 4.2 cm in diameter. Recommend annual imaging followup by CTA or MRA. This recommendation  follows 2010 ACCF/AHA/AATS/ACR/ASA/SCA/SCAI/SIR/STS/SVM Guidelines for the Diagnosis and Management of Patients with Thoracic Aortic Disease. Circulation. 2010; 121: W409-B353. Aortic aneurysm NOS (ICD10-I71.9) 5. Cholelithiasis without CT evidence of acute cholecystitis. 6. 2.1 cm cystic pancreatic lesion. Follow-up contrast enhanced MRI is recommended 1 year. This is favored to represent a benign etiology given its appearance.  SARS negative  Wbc 10.1, Hgb 13.9, Plt 302 INR 1.0 BNP 77 Trop <0.03 Lactic acid 1.4 Na 139, K 4.6, Bun 18, Creatinine 1.23 Glucose 142 Urinalysis negative  Pt given ativan 0.5mg  iv x1 in ED for anxiety.  Pt will be admitted for partially loculated right pleural effusion with lung collapse.       Review of systems:    In addition to the HPI above,  No Fever-chills, No Headache, No changes with Vision or hearing, No problems swallowing food or Liquids, No Chest pain, No Cough  No Abdominal pain, No Nausea or Vomiting, bowel movements are regular, No Blood in stool or Urine, No dysuria, No new skin rashes or bruises, No new joints pains-aches,  No new weakness, tingling, numbness in any extremity, No recent weight gain  or loss, No polyuria, polydypsia or polyphagia, No significant Mental Stressors.  All other systems reviewed and are negative.    Past History of the following :    Past Medical History:  Diagnosis Date  . Anxiety   . Aortic stenosis    mild AS by 07/20/13 echo ([facility])  . Arthritis    "all over"  . Coronary artery disease   . Depression   . GERD (gastroesophageal reflux disease)   . H/O hiatal hernia   . Hearing aid worn    B/L  . High cholesterol   . History of blood transfusion reaction March 13, 2013   "he almost died; he has to get irradiated blood next time"  . Hypertension   . Hypothyroidism   . Ischemic cardiomyopathy   . ITP (idiopathic thrombocytopenic purpura)    Dr. Gaynelle Arabian, on [place]   . Myocardial infarction ([facility]) 13-Mar-2010  . Pneumonia 1940's X 1; 03-13-14 X 1  . Shortness of breath    with exertion, has not been very active  . Sleep apnea   . Wears glasses   . Wears partial dentures       Past Surgical History:  Procedure Laterality Date  . APPENDECTOMY    . BACK SURGERY    . CATARACT EXTRACTION, BILATERAL    . COLONOSCOPY    . CORONARY ANGIOPLASTY WITH STENT PLACEMENT     "think I have 3-4 stents" (06/22/2014)  . CORONARY ARTERY BYPASS GRAFT  03/13/2010   "CABG X3"  . HERNIA REPAIR    . HIATAL HERNIA REPAIR    . KNEE ARTHROSCOPY Left 06/22/2014   w/I&D  . KNEE ARTHROSCOPY Left 06/22/2014   Procedure: [employer];  Surgeon: Hessie Dibble, MD;  Location: [facility];  Service: Orthopedics;  Laterality: Left;  . LUMBAR DISC SURGERY  1960's?  . MULTIPLE TOOTH EXTRACTIONS    . SHOULDER ARTHROSCOPY Right 04/09/2017   Procedure: ARTHROSCOPY SHOULDER;  Surgeon: Melrose Nakayama, MD;  Location: [facility];  Service: Orthopedics;  Laterality: Right;  . STERNAL CLOSURE     "wires from OHS taken out; plate put in" (02/27/299)  . SYNOVECTOMY Left 08/17/2014   Procedure: SYNOVECTOMY LEFT KNEE;  Surgeon: Hessie Dibble, MD;  Location: [facility];  Service: Orthopedics;  Laterality: Left;  . TOTAL HIP ARTHROPLASTY Left   . TOTAL HIP ARTHROPLASTY Right 06/29/2018   Procedure: RIGHT TOTAL HIP ARTHROPLASTY ANTERIOR APPROACH;  Surgeon: Melrose Nakayama, MD;  Location: [facility];  Service: Orthopedics;  Laterality: Right;  . TOTAL KNEE ARTHROPLASTY Left 11/24/2014   Procedure: TOTAL KNEE ARTHROPLASTY;  Surgeon: Hessie Dibble, MD;  Location: [facility];  Service: Orthopedics;  Laterality: Left;      Social History:      Social History   Tobacco Use  . Smoking status: Former Smoker    Packs/day: 1.00    Years: 15.00    Pack years: 15.00    Types: Cigarettes    Last attempt to quit: 12/22/1966    Years since quitting: 52.4  . Smokeless tobacco: Former Systems developer    Types: Chew   . Tobacco comment: "quit smoking ~ late ~ 60's; quit chewing in the 1970's"  Substance Use Topics  . Alcohol use: No       Family History :     Family History  Problem Relation Age of Onset  . Heart disease Other   . Arthritis Other   . Heart disease Mother   . Alzheimer's  disease Father   . Rheum arthritis Sister   . Rheum arthritis Brother   . Healthy Son        Home Medications:   Prior to Admission medications   Medication Sig Start Date End Date Taking? Authorizing Provider  ALPRAZolam (XANAX) 0.25 MG tablet Take 1 tablet (0.25 mg total) by mouth 2 (two) times daily as needed for anxiety. 07/02/18  Yes Loni Dolly, PA-C  aspirin EC 81 MG tablet Take 81 mg by mouth daily.   Yes [provider]  azithromycin (ZITHROMAX) 250 MG tablet Take 250-500 mg by mouth See admin instructions. Starting on 05/12/2019 take 500mg  on day 1 then take 250mg  on days 2 through 5   Yes [provider]  carbidopa-levodopa (SINEMET IR) 25-100 MG tablet Take 1 tablet by mouth 3 (three) times daily. 12/02/18  Yes Tat, Eustace Quail, DO  Fluticasone-Salmeterol (ADVAIR) 100-50 MCG/DOSE AEPB Inhale 1 puff into the lungs daily.   Yes [provider]  folic acid (FOLVITE) 1 MG tablet Take 1 mg by mouth daily.   Yes [provider]  levothyroxine (SYNTHROID, LEVOTHROID) 200 MCG tablet Take 200 mcg by mouth at bedtime.    Yes [provider]  losartan (COZAAR) 50 MG tablet Take 50 mg by mouth at bedtime. 05/28/18  Yes [provider]  methotrexate (RHEUMATREX) 2.5 MG tablet Take 15 mg by mouth once a week. Caution:Chemotherapy. Protect from light.   Yes [provider]  Omega-3 Fatty Acids (FISH OIL) 1200 MG CAPS Take 1,200 mg by mouth at bedtime. 360 MG OMEGA-3   Yes [provider]  omeprazole (PRILOSEC) 40 MG capsule Take 40 mg by mouth at bedtime.    Yes [provider]  predniSONE (DELTASONE) 10 MG tablet Take 10 mg by mouth daily  with breakfast.   Yes [provider]  rOPINIRole (REQUIP) 0.5 MG tablet Take 0.5 mg by mouth at bedtime.   Yes [provider]  tiZANidine (ZANAFLEX) 4 MG tablet Take 1 tablet (4 mg total) by mouth every 6 (six) hours as needed for muscle spasms. Patient taking differently: Take 4 mg by mouth at bedtime.  07/01/18 07/01/19 Yes Loni Dolly, PA-C  Vitamin D, Ergocalciferol, (DRISDOL) 1.25 MG (50000 UT) CAPS capsule Take 50,000 Units by mouth every 7 (seven) days.   Yes [provider]     Allergies:     Allergies  Allergen Reactions  . Lipitor [Atorvastatin] Other (See Comments)    muscle spasms cramps  . Methylprednisolone Other (See Comments)    cramps cramps  . Other     If patient is to receive blood - blood must be treated witgh radiation because his body will not accept a normal infusion   . Sulfa Antibiotics Itching  . Doxycycline Rash    Doxycycline caused itching redness on scalp and head Doxycycline caused itching redness on scalp and head     Physical Exam:   Vitals  Blood pressure (!) 144/112, pulse 79, temperature 97.9 F (36.6 C), temperature source Oral, resp. rate (!) 27, height 6\' 1"  (1.854 m), weight 99.8 kg, SpO2 98 %.  1.  General:  axox3  2. Psychiatric: euthymic  3. Neurologic: Slight resting tremor,  cn2-12 intact, reflexes 2+ symmetric, diffuse with no clonus, motor 5/5 in all 4 ext  4. HEENMT:  Anicteric, pupils 1.70mm symmetric, direct, consensual , near intact Mmm Neck: no jvd  5. Respiratory : Decrease bs at right base about 1/3 up, no crackles ,  no wheeze  6. Cardiovascular : rrr s1, s2, 2/6 sem rusb  7. Gastrointestinal:  Abd: soft, nt, nd, +bs  8. Skin:  Ext: no edema, slight clubbing, no rash heberdens nodules  9.Musculoskeletal:  Good ROM  No adenopathy    Data Review:    CBC Recent Labs  Lab 05/14/19 1810  WBC 10.1  HGB 13.9  HCT 42.4  PLT 302  MCV 95.1  MCH 31.2  MCHC 32.8  RDW  15.1  LYMPHSABS 1.1  MONOABS 0.8  EOSABS 0.1  BASOSABS 0.0   ------------------------------------------------------------------------------------------------------------------  Results for orders placed or performed during the hospital encounter of 05/14/19 (from the past 48 hour(s))  Basic metabolic panel     Status: Abnormal   Collection Time: 05/14/19  6:10 PM  Result Value Ref Range   Sodium 139 135 - 145 mmol/L   Potassium 4.6 3.5 - 5.1 mmol/L   Chloride 104 98 - 111 mmol/L   CO2 21 (L) 22 - 32 mmol/L   Glucose, Bld 142 (H) 70 - 99 mg/dL   BUN 18 8 - 23 mg/dL   Creatinine, Ser 1.23 0.61 - 1.24 mg/dL   Calcium 9.3 8.9 - 10.3 mg/dL   GFR calc non Af Amer 54 (L) >60 mL/min   GFR calc Af Amer >60 >60 mL/min   Anion gap 14 5 - 15    Comment: Performed at [facility], [address], [place], [place] [address]  Brain natriuretic peptide     Status: None   Collection Time: 05/14/19  6:10 PM  Result Value Ref Range   B Natriuretic Peptide 77.0 0.0 - 100.0 pg/mL    Comment: Performed at [facility], [address] [place], [place] [address]  Troponin I - Once     Status: None   Collection Time: 05/14/19  6:10 PM  Result Value Ref Range   Troponin I <0.03 <0.03 ng/mL    Comment: Performed at [facility], [address] [place], [place] [address]  CBC with Differential     Status: Abnormal   Collection Time: 05/14/19  6:10 PM  Result Value Ref Range   WBC 10.1 4.0 - 10.5 K/uL   RBC 4.46 4.22 - 5.81 MIL/uL   Hemoglobin 13.9 13.0 - 17.0 g/dL   HCT 42.4 39.0 - 52.0 %   MCV 95.1 80.0 - 100.0 fL   MCH 31.2 26.0 - 34.0 pg   MCHC 32.8 30.0 - 36.0 g/dL   RDW 15.1 11.5 - 15.5 %   Platelets 302 150 - 400 K/uL   nRBC 0.0 0.0 - 0.2 %   Neutrophils Relative % 81 %   Neutro Abs 8.1 (H) 1.7 - 7.7 K/uL   Lymphocytes Relative 11 %   Lymphs Abs 1.1 0.7 - 4.0 K/uL   Monocytes Relative 7 %   Monocytes Absolute 0.8 0.1 - 1.0 K/uL   Eosinophils Relative 1 %   Eosinophils Absolute 0.1 0.0  - 0.5 K/uL   Basophils Relative 0 %   Basophils Absolute 0.0 0.0 - 0.1 K/uL   Immature Granulocytes 0 %   Abs Immature Granulocytes 0.04 0.00 - 0.07 K/uL    Comment: Performed at [facility], [address] [place], [place] [address]  Protime-INR     Status: None   Collection Time: 05/14/19  6:10 PM  Result Value Ref Range   Prothrombin Time 12.8 11.4 - 15.2 seconds   INR 1.0 0.8 - 1.2    Comment: (NOTE) INR goal varies  based on device and disease states. Performed at [facility], [address] [place], [place] [address]   SARS Coronavirus 2 (CEPHEID - Performed in [facility] hospital lab), Hosp Order     Status: None   Collection Time: 05/14/19  6:40 PM  Result Value Ref Range   SARS Coronavirus 2 NEGATIVE NEGATIVE    Comment: (NOTE) If result is NEGATIVE SARS-CoV-2 target nucleic acids are NOT DETECTED. The SARS-CoV-2 RNA is generally detectable in upper and lower  respiratory specimens during the acute phase of infection. The lowest  concentration of SARS-CoV-2 viral copies this assay can detect is 250  copies / mL. A negative result does not preclude SARS-CoV-2 infection  and should not be used as the sole basis for treatment or other  patient management decisions.  A negative result may occur with  improper specimen collection / handling, submission of specimen other  than nasopharyngeal swab, presence of viral mutation(s) within the  areas targeted by this assay, and inadequate number of viral copies  (<250 copies / mL). A negative result must be combined with clinical  observations, patient history, and epidemiological information. If result is POSITIVE SARS-CoV-2 target nucleic acids are DETECTED. The SARS-CoV-2 RNA is generally detectable in upper and lower  respiratory specimens dur ing the acute phase of infection.  Positive  results are indicative of active infection with SARS-CoV-2.  Clinical  correlation with patient history and other diagnostic information is   necessary to determine patient infection status.  Positive results do  not rule out bacterial infection or co-infection with other viruses. If result is PRESUMPTIVE POSTIVE SARS-CoV-2 nucleic acids MAY BE PRESENT.   A presumptive positive result was obtained on the submitted specimen  and confirmed on repeat testing.  While 2019 novel coronavirus  (SARS-CoV-2) nucleic acids may be present in the submitted sample  additional confirmatory testing may be necessary for epidemiological  and / or clinical management purposes  to differentiate between  SARS-CoV-2 and other Sarbecovirus currently known to infect humans.  If clinically indicated additional testing with an alternate test  methodology [id number]) is advised. The SARS-CoV-2 RNA is generally  detectable in upper and lower respiratory sp ecimens during the acute  phase of infection. The expected result is Negative. Fact Sheet for Patients:  StrictlyIdeas.no Fact Sheet for Healthcare Providers: BankingDealers.co.za This test is not yet approved or cleared by the [place] FDA and has been authorized for detection and/or diagnosis of SARS-CoV-2 by FDA under an Emergency Use Authorization (EUA).  This EUA will remain in effect (meaning this test can be used) for the duration of the COVID-19 declaration under Section 564(b)(1) of the Act, 21 U.S.C. section 360bbb-3(b)(1), unless the authorization is terminated or revoked sooner. Performed at [facility], [address], [place], [place] [address]   Lactic acid, plasma     Status: None   Collection Time: 05/14/19  6:41 PM  Result Value Ref Range   Lactic Acid, Venous 1.4 0.5 - 1.9 mmol/L    Comment: Performed at [facility], [address] [place], [place] [address]  Urinalysis, Routine w reflex microscopic     Status: None   Collection Time: 05/14/19  8:10 PM  Result Value Ref Range   Color, Urine YELLOW YELLOW   APPearance CLEAR  CLEAR   Specific Gravity, Urine 1.015 1.005 - 1.030   pH 6.0 5.0 - 8.0   Glucose, UA NEGATIVE NEGATIVE mg/dL   Hgb urine dipstick NEGATIVE NEGATIVE   Bilirubin Urine  NEGATIVE NEGATIVE   Ketones, ur NEGATIVE NEGATIVE mg/dL   Protein, ur NEGATIVE NEGATIVE mg/dL   Nitrite NEGATIVE NEGATIVE   Leukocytes,Ua NEGATIVE NEGATIVE    Comment: Performed at [facility], [address], [place], [place] [address]  Lactic acid, plasma     Status: None   Collection Time: 05/14/19  8:24 PM  Result Value Ref Range   Lactic Acid, Venous 1.3 0.5 - 1.9 mmol/L    Comment: Performed at [facility], [address], [place],  [address]    Chemistries  Recent Labs  Lab 05/14/19 1810  NA 139  K 4.6  CL 104  CO2 21*  GLUCOSE 142*  BUN 18  CREATININE 1.23  CALCIUM 9.3   ------------------------------------------------------------------------------------------------------------------  ------------------------------------------------------------------------------------------------------------------ GFR: Estimated Creatinine Clearance: 57.6 mL/min (by C-G formula based on SCr of 1.23 mg/dL). Liver Function Tests: No results for input(s): AST, ALT, ALKPHOS, BILITOT, PROT, ALBUMIN in the last 168 hours. No results for input(s): LIPASE, AMYLASE in the last 168 hours. No results for input(s): AMMONIA in the last 168 hours. Coagulation Profile: Recent Labs  Lab 05/14/19 1810  INR 1.0   Cardiac Enzymes: Recent Labs  Lab 05/14/19 1810  TROPONINI <0.03   BNP (last 3 results) No results for input(s): PROBNP in the last 8760 hours. HbA1C: No results for input(s): HGBA1C in the last 72 hours. CBG: No results for input(s): GLUCAP in the last 168 hours. Lipid Profile: No results for input(s): CHOL, HDL, LDLCALC, TRIG, CHOLHDL, LDLDIRECT in the last 72 hours. Thyroid Function Tests: No results for input(s): TSH, T4TOTAL, FREET4, T3FREE, THYROIDAB in the last 72 hours. Anemia Panel: No results  for input(s): VITAMINB12, FOLATE, FERRITIN, TIBC, IRON, RETICCTPCT in the last 72 hours.  --------------------------------------------------------------------------------------------------------------- Urine analysis:    Component Value Date/Time   COLORURINE YELLOW 05/14/2019 2010   APPEARANCEUR CLEAR 05/14/2019 2010   LABSPEC 1.015 05/14/2019 2010   PHURINE 6.0 05/14/2019 2010   GLUCOSEU NEGATIVE 05/14/2019 2010   HGBUR NEGATIVE 05/14/2019 2010   [place] NEGATIVE 05/14/2019 2010   [place] 05/14/2019 2010   PROTEINUR NEGATIVE 05/14/2019 2010   UROBILINOGEN 0.2 11/22/2014 1022   NITRITE NEGATIVE 05/14/2019 2010   LEUKOCYTESUR NEGATIVE 05/14/2019 2010      Imaging Results:    Ct Angio Chest Pe W/cm &/or Wo Cm  Result Date: 05/14/2019 CLINICAL DATA:  Shortness of breath EXAM: CT ANGIOGRAPHY CHEST WITH CONTRAST TECHNIQUE: Multidetector CT imaging of the chest was performed using the standard protocol during bolus administration of intravenous contrast. Multiplanar CT image reconstructions and MIPs were obtained to evaluate the vascular anatomy. CONTRAST:  40mL OMNIPAQUE IOHEXOL 350 MG/ML SOLN COMPARISON:  Correlation made with chest x-ray from the same day FINDINGS: Cardiovascular: The patient is status post prior CABG. Aortic calcifications are noted. There is no pulmonary embolus identified. The ascending aorta is ectatic measuring up to approximately 4.2 cm in diameter. Dense coronary artery calcifications are noted. Mediastinum/Nodes: No enlarged mediastinal, hilar, or axillary lymph nodes. Thyroid gland, trachea, and esophagus demonstrate no significant findings. There are no pathologically enlarged axillary or supraclavicular lymph nodes. Lungs/Pleura: There is a large right-sided pleural effusion that is likely to some degree loculated. Multiple calcified pleural based plaques are noted primarily at the right lung base. There is some mild areas of pleural thickening  superiorly. There is near complete collapse of the right lower lobe. The trachea is unremarkable. There is partial collapse of the right middle lobe. Upper Abdomen: There is cholelithiasis without CT evidence of  acute cholecystitis. There is a 2 point 1 cm cystic appearing lesion near the pancreatic body (axial series 5, image 329). The visualized stomach is unremarkable. There may be a small nonobstructing stone versus vascular calcification in the interpolar region of the left kidney. Musculoskeletal: No chest wall abnormality. No acute or significant osseous findings. Review of the MIP images confirms the above findings. IMPRESSION: 1. No PE. 2. Large, likely somewhat loculated, right-sided pleural effusion with near complete collapse of the right lower lobe. 3. Multiple calcified pleural based plaques are noted on the right. There is some mild pleural thickening towards the right lung apex. Findings can be seen in patients with prior asbestos exposure. 4. Ectatic thoracic aorta measuring approximately 4.2 cm in diameter. Recommend annual imaging followup by CTA or MRA. This recommendation follows 2010 ACCF/AHA/AATS/ACR/ASA/SCA/SCAI/SIR/STS/SVM Guidelines for the Diagnosis and Management of Patients with Thoracic Aortic Disease. Circulation. 2010; 121: J093-O671. Aortic aneurysm NOS (ICD10-I71.9) 5. Cholelithiasis without CT evidence of acute cholecystitis. 6. 2.1 cm cystic pancreatic lesion. Follow-up contrast enhanced MRI is recommended 1 year. This is favored to represent a benign etiology given its appearance. Aortic Atherosclerosis (ICD10-I70.0). Electronically Signed   By: Constance Holster M.D.   On: 05/14/2019 21:26   Dg Chest Port 1 View  Result Date: 05/14/2019 CLINICAL DATA:  Shortness of breath, cough EXAM: PORTABLE CHEST 1 VIEW COMPARISON:  06/20/2014, 06/17/2018, 04/09/2017 FINDINGS: There is a small right pleural effusion. There is no left pleural effusion. There is mild right basilar  atelectasis. There is no other focal parenchymal opacity. The heart mediastinum are stable. There is evidence of prior CABG. The osseous structures are unremarkable. IMPRESSION: Small right pleural effusion. Electronically Signed   By: Kathreen Devoid   On: 05/14/2019 19:08   EKG nsr at 90, poor baseline   Assessment & Plan:    Principal Problem:   Pleural effusion on right Active Problems:   CAD (coronary artery disease) of artery bypass graft   Hypothyroidism   PD (Parkinson's disease) (HCC)  Right pleural effusion, ? Partially loculated.   Blood culture x2 Sputum culture vanco iv x1, zosyn iv x1 Pulmonary consulted for evaluation of R pleural effusion ? Loculated Defer to pulmonary regarding thoracentesis vs pleurex vs CT surgery consultation No hx of wretching, could consider gastrographin swallow study r/o  [place], and CT does have fairly good sensitivity to detect this Defer to pulmonary regarding above, appreciate input  CAD s/p CABG, DES Lcx, DES LAD Cont Aspirin 81mg  po qday Cont Losartan 50mg  po qday Hold fish oil Unclear why not on B blocker ? Due to Copd Check Lipid, unclear why not on statin ? Due to allergy to Lipitor? Hydralazine 5mg  iv q6h prn sbp >160  Copd Cont Advair=> Breo 1puff qday Cont albuterol neb bid  H/o Rheumatoid arthritis HOLD methotrexate Cont Prednisone 10mg  po qday Cont Folic acid 1mg  po qday  Hypothyroidism Cont Levothyroxine 265micrograms po qday   Anxiety Xanax 0.25mg  po bid prn  Parkinsons Cont Sinemet IR 25/100mg  po tid  RLS Cont Ropinirole 0.5mg  po qhs Cont Zanaflex 4mg  po qhs  Gerd Cont PPI  Pancreatic lesion Please arrange for MRI as outpatient  Calcified pleural plaques  ? Asbestos exposure F/u with his pulmonologist as outpatient  DVT Prophylaxis-   Lovenox - SCDs   AM Labs Ordered, also please review Full Orders  Family Communication: Admission, patients condition and plan of care including tests being  ordered have been discussed with the patient  who indicate understanding and  agree with the plan and Code Status.  Code Status:  FULL CODE  Admission status: Observation: Based on patients clinical presentation and evaluation of above clinical data, I have made determination that patient meets observation criteria at this time. Depending upon results  Of thoracentesis may require change to inpatient status  Time spent in minutes : 70 minutes   Jani Gravel M.D on 05/14/2019 at 9:59 PM

## 2019-05-14 NOTE — ED Notes (Signed)
Patient ambulated to the bathroom.

## 2019-05-14 NOTE — ED Triage Notes (Signed)
Pt from home.  Saw PCP for increased sob and was supposed to call to have an appt set up to "draw fluid off of me"

## 2019-05-14 NOTE — ED Provider Notes (Signed)
[facility] EMERGENCY DEPARTMENT Provider Note   CSN: [id number] Arrival date & time: 05/14/19  1745    History   Chief Complaint No chief complaint on file.   HPI Jeffrey [name] is a 85 y.o. male.     HPI  Pt was seen at 1820. Per pt, c/o gradual onset and worsening of SOB for the past 1 week, worse over the past few days. Has been associated with cough. Pt states he was evaluated by his PMD for same and was told he "needed fluid taken off my lung" and "to see if I have asbestos." Pt states he has not been wearing a mask or social distancing, as well as been having multiple extended family members (children, grandchildren) "in and out" of his house "for weeks." Denies fevers, known COVID+ exposure. Denies CP/palpitations, no abd pain, no N/V/D, no rash, no calf/LE pain or unilateral swelling.      Past Medical History:  Diagnosis Date   Anxiety    Aortic stenosis    mild AS by 07/20/13 echo ([facility])   Arthritis    "all over"   Coronary artery disease    NSTEMI 06/2010, CABG x3=> [place]->LAD, SVG->OM1, SVG->PDA, DES LCx 10/2011, DES LAD 12/2011   Depression    GERD (gastroesophageal reflux disease)    H/O hiatal hernia    Hearing aid worn    B/L   High cholesterol    History of blood transfusion reaction 03/11/2013   "he almost died; he has to get irradiated blood next time"   Hypertension    Hypothyroidism    Ischemic cardiomyopathy    ITP (idiopathic thrombocytopenic purpura)    Dr. Gaynelle Arabian, on [place]   Myocardial infarction [facility]) 2010-03-11   Pneumonia 1940's X 1; 2014-03-11 X 1   Shortness of breath    with exertion, has not been very active   Sleep apnea    Wears glasses    Wears partial dentures     Patient Active Problem List   Diagnosis Date Noted   Pleural effusion on right 05/14/2019   PD (Parkinson's disease) ([place]) 12/02/2018   Primary osteoarthritis of right hip 06/29/2018   Primary osteoarthritis of left knee  11/24/2014   Primary osteoarthritis of knee 11/24/2014   Bacterial infection of knee joint ([place]) 08/17/2014   Septic joint of left knee joint ([place]) 06/22/2014   CAP (community acquired pneumonia) 01/26/2014   CAD (coronary artery disease) of artery bypass graft 01/26/2014   Hypothyroidism 01/26/2014   Thrombocytopenia, unspecified ([place]) 01/26/2014   Nausea alone 01/26/2014   Effusion of knee joint, left 10/11/2012   OA (osteoarthritis) of knee 10/11/2012   HIP PAIN 11/15/2007   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   MULTIPLE TOOTH EXTRACTIONS  SHOULDER ARTHROSCOPY Right 04/09/2017   Procedure: ARTHROSCOPY SHOULDER;  Surgeon: Melrose Nakayama, MD;  Location: [place];  Service: Orthopedics;  Laterality: Right;   STERNAL CLOSURE     "wires from OHS taken out; plate put in" (7/0/1779)   SYNOVECTOMY Left 08/17/2014   Procedure: SYNOVECTOMY LEFT KNEE;  Surgeon: Hessie Dibble, MD;  Location: [place];  Service: Orthopedics;  Laterality: Left;   TOTAL HIP ARTHROPLASTY Left    TOTAL HIP ARTHROPLASTY Right 06/29/2018   Procedure: RIGHT TOTAL HIP ARTHROPLASTY ANTERIOR APPROACH;  Surgeon: Melrose Nakayama, MD;  Location: [facility];  Service: Orthopedics;  Laterality: Right;   TOTAL KNEE ARTHROPLASTY Left 11/24/2014   Procedure: TOTAL KNEE ARTHROPLASTY;  Surgeon: Hessie Dibble, MD;   Location: [place];  Service: Orthopedics;  Laterality: Left;        Home Medications    Prior to Admission medications   Medication Sig Start Date End Date Taking? Authorizing Provider  ALPRAZolam (XANAX) 0.25 MG tablet Take 1 tablet (0.25 mg total) by mouth 2 (two) times daily as needed for anxiety. 07/02/18  Yes Loni Dolly, PA-C  aspirin EC 81 MG tablet Take 81 mg by mouth daily.   Yes [provider]  azithromycin (ZITHROMAX) 250 MG tablet Take 250-500 mg by mouth See admin instructions. Starting on 05/12/2019 take 500mg  on day 1 then take 250mg  on days 2 through 5   Yes [provider]  carbidopa-levodopa (SINEMET IR) 25-100 MG tablet Take 1 tablet by mouth 3 (three) times daily. 12/02/18  Yes Tat, Eustace Quail, DO  Fluticasone-Salmeterol (ADVAIR) 100-50 MCG/DOSE AEPB Inhale 1 puff into the lungs daily.   Yes [provider]  folic acid (FOLVITE) 1 MG tablet Take 1 mg by mouth daily.   Yes [provider]  levothyroxine (SYNTHROID, LEVOTHROID) 200 MCG tablet Take 200 mcg by mouth at bedtime.    Yes [provider]  losartan (COZAAR) 50 MG tablet Take 50 mg by mouth at bedtime. 05/28/18  Yes [provider]  methotrexate (RHEUMATREX) 2.5 MG tablet Take 15 mg by mouth once a week. Caution:Chemotherapy. Protect from light.   Yes [provider]  Omega-3 Fatty Acids (FISH OIL) 1200 MG CAPS Take 1,200 mg by mouth at bedtime. 360 MG OMEGA-3   Yes [provider]  omeprazole (PRILOSEC) 40 MG capsule Take 40 mg by mouth at bedtime.    Yes [provider]  predniSONE (DELTASONE) 10 MG tablet Take 10 mg by mouth daily with breakfast.   Yes [provider]  rOPINIRole (REQUIP) 0.5 MG tablet Take 0.5 mg by mouth at bedtime.   Yes [provider]  tiZANidine (ZANAFLEX) 4 MG tablet Take 1 tablet (4 mg total) by mouth every 6 (six) hours as needed for muscle spasms. Patient taking differently: Take 4 mg by mouth  at bedtime.  07/01/18 07/01/19 Yes Loni Dolly, PA-C  Vitamin D, Ergocalciferol, (DRISDOL) 1.25 MG (50000 UT) CAPS capsule Take 50,000 Units by mouth every 7 (seven) days.   Yes [provider]    Family History Family History  Problem Relation Age of Onset   Heart disease Other    Arthritis Other    Heart disease Mother    Alzheimer's disease Father    Rheum arthritis Sister    Rheum arthritis Brother    Healthy Son     Social History Social History   Tobacco Use   Smoking status: Former Smoker    Packs/day: 1.00    Years:  15.00    Pack years: 15.00    Types: Cigarettes    Last attempt to quit: 12/22/1966    Years since quitting: 52.4   Smokeless tobacco: Former Systems developer    Types: Chew   Tobacco comment: "quit smoking ~ late ~ 60's; quit chewing in the 1970's"  Substance Use Topics   Alcohol use: No   Drug use: No     Allergies   Lipitor [atorvastatin]; Methylprednisolone; Other; Sulfa antibiotics; and Doxycycline   Review of Systems Review of Systems ROS: Statement: All systems negative except as marked or noted in the HPI; Constitutional: Negative for fever and chills. ; ; Eyes: Negative for eye pain, redness and discharge. ; ; ENMT: Negative for ear pain, hoarseness, nasal congestion, sinus pressure and sore throat. ; ; Cardiovascular: Negative for chest pain, palpitations, diaphoresis and peripheral edema. ; ; Respiratory: +SOB, cough. Negative for wheezing and stridor. ; ; Gastrointestinal: Negative for nausea, vomiting, diarrhea, abdominal pain, blood in stool, hematemesis, jaundice and rectal bleeding. . ; ; Genitourinary: Negative for dysuria, flank pain and hematuria. ; ; Musculoskeletal: Negative for back pain and neck pain. Negative for swelling and trauma.; ; Skin: Negative for pruritus, rash, abrasions, blisters, bruising and skin lesion.; ; Neuro: Negative for headache, lightheadedness and neck stiffness. Negative for weakness, altered level of  consciousness, altered mental status, extremity weakness, paresthesias, involuntary movement, seizure and syncope.     Physical Exam Updated Vital Signs BP (!) 159/98    Pulse 69    Temp 97.9 F (36.6 C) (Oral)    Resp 17    Ht 6\' 1"  (1.854 m)    Wt 99.8 kg    SpO2 92%    BMI 29.03 kg/m     Patient Vitals for the past 24 hrs:  BP Temp Temp src Pulse Resp SpO2 Height Weight  05/14/19 2200 (!) 159/98 -- -- 69 17 92 % -- --  05/14/19 2003 (!) 144/112 -- -- 79 (!) 27 98 % -- --  05/14/19 2002 (!) 144/112 -- -- 81 20 95 % -- --  05/14/19 2000 (!) 177/101 -- -- -- (!) 27 -- -- --  05/14/19 1958 (!) 148/98 -- -- 82 (!) 26 96 % -- --  05/14/19 1830 137/88 -- -- 72 (!) 21 92 % -- --  05/14/19 1756 137/88 97.9 F (36.6 C) Oral 78 19 94 % -- --  05/14/19 1753 -- -- -- -- -- -- 6\' 1"  (1.854 m) 99.8 kg     Physical Exam 1825: Physical examination:  Nursing notes reviewed; Vital signs and O2 SAT reviewed;  Constitutional: Well developed, Well nourished, Well hydrated, In no acute distress; Head:  Normocephalic, atraumatic; Eyes: EOMI, PERRL, No scleral icterus; ENMT: Mouth and pharynx normal, Mucous membranes moist; Neck: Supple, Full range of motion, No lymphadenopathy; Cardiovascular: Regular rate and rhythm, No gallop; Respiratory: Breath sounds decreased right/clear left & equal bilaterally, No wheezes.  Speaking full sentences with ease, Normal respiratory effort/excursion; Chest: Nontender, Movement normal; Abdomen: Soft, Nontender, Nondistended, Normal bowel sounds; Genitourinary: No CVA tenderness; Extremities: Peripheral pulses normal, No tenderness, No edema, No calf edema or asymmetry.; Neuro: AA&Ox3, Major CN grossly intact.  Speech clear. +tremor per baseline.  No gross focal motor deficits in extremities.; Skin: Color normal, Warm, Dry.      ED Treatments / Results  Labs (all labs ordered are listed, but only abnormal results are displayed)   EKG EKG  Interpretation  Date/Time:  Saturday May 14 2019 17:55:35  EDT Ventricular Rate:  90 PR Interval:    QRS Duration: 118 QT Interval:  394 QTC Calculation: 483 R Axis:   -17 Text Interpretation:  Poor data quality Normal sinus rhythm Nonspecific intraventricular conduction delay Baseline wander Artifact When compared with ECG of 06/29/2018 Artifact is now Present Confirmed by Francine Graven [phone]) on 05/14/2019 6:54:58 PM   Radiology   Procedures Procedures (including critical care time)  Medications Ordered in ED Medications  iohexol (OMNIPAQUE) 350 MG/ML injection 75 mL (75 mLs Intravenous Contrast Given 05/14/19 2053)     Initial Impression / Assessment and Plan / ED Course  I have reviewed the triage vital signs and the nursing notes.  Pertinent labs & imaging results that were available during my care of the patient were reviewed by me and considered in my medical decision making (see chart for details).     MDM Reviewed: previous chart, nursing note and vitals Reviewed previous: labs and ECG Interpretation: labs, ECG, x-ray and CT scan    Results for orders placed or performed during the hospital encounter of 05/14/19  SARS Coronavirus 2 (CEPHEID - Performed in [place] hospital lab), [facility] Order  Result Value Ref Range   SARS Coronavirus 2 NEGATIVE NEGATIVE  Basic metabolic panel  Result Value Ref Range   Sodium 139 135 - 145 mmol/L   Potassium 4.6 3.5 - 5.1 mmol/L   Chloride 104 98 - 111 mmol/L   CO2 21 (L) 22 - 32 mmol/L   Glucose, Bld 142 (H) 70 - 99 mg/dL   BUN 18 8 - 23 mg/dL   Creatinine, Ser 1.23 0.61 - 1.24 mg/dL   Calcium 9.3 8.9 - 10.3 mg/dL   GFR calc non Af Amer 54 (L) >60 mL/min   GFR calc Af Amer >60 >60 mL/min   Anion gap 14 5 - 15  Brain natriuretic peptide  Result Value Ref Range   B Natriuretic Peptide 77.0 0.0 - 100.0 pg/mL  Troponin I - Once  Result Value Ref Range   Troponin I <0.03 <0.03 ng/mL  Lactic acid, plasma  Result Value  Ref Range   Lactic Acid, Venous 1.4 0.5 - 1.9 mmol/L  Lactic acid, plasma  Result Value Ref Range   Lactic Acid, Venous 1.3 0.5 - 1.9 mmol/L  CBC with Differential  Result Value Ref Range   WBC 10.1 4.0 - 10.5 K/uL   RBC 4.46 4.22 - 5.81 MIL/uL   Hemoglobin 13.9 13.0 - 17.0 g/dL   HCT 42.4 39.0 - 52.0 %   MCV 95.1 80.0 - 100.0 fL   MCH 31.2 26.0 - 34.0 pg   MCHC 32.8 30.0 - 36.0 g/dL   RDW 15.1 11.5 - 15.5 %   Platelets 302 150 - 400 K/uL   nRBC 0.0 0.0 - 0.2 %   Neutrophils Relative % 81 %   Neutro Abs 8.1 (H) 1.7 - 7.7 K/uL   Lymphocytes Relative 11 %   Lymphs Abs 1.1 0.7 - 4.0 K/uL   Monocytes Relative 7 %   Monocytes Absolute 0.8 0.1 - 1.0 K/uL   Eosinophils Relative 1 %   Eosinophils Absolute 0.1 0.0 - 0.5 K/uL   Basophils Relative 0 %   Basophils Absolute 0.0 0.0 - 0.1 K/uL   Immature Granulocytes 0 %   Abs Immature Granulocytes 0.04 0.00 - 0.07 K/uL  Protime-INR  Result Value Ref Range   Prothrombin Time 12.8 11.4 - 15.2 seconds   INR 1.0 0.8 - 1.2  Urinalysis, Routine  w reflex microscopic  Result Value Ref Range   Color, Urine YELLOW YELLOW   APPearance CLEAR CLEAR   Specific Gravity, Urine 1.015 1.005 - 1.030   pH 6.0 5.0 - 8.0   Glucose, UA NEGATIVE NEGATIVE mg/dL   Hgb urine dipstick NEGATIVE NEGATIVE   Bilirubin Urine NEGATIVE NEGATIVE   Ketones, ur NEGATIVE NEGATIVE mg/dL   Protein, ur NEGATIVE NEGATIVE mg/dL   Nitrite NEGATIVE NEGATIVE   Leukocytes,Ua NEGATIVE NEGATIVE   Ct Angio Chest Pe W/cm &/or Wo Cm Result Date: 05/14/2019 CLINICAL DATA:  Shortness of breath EXAM: CT ANGIOGRAPHY CHEST WITH CONTRAST TECHNIQUE: Multidetector CT imaging of the chest was performed using the standard protocol during bolus administration of intravenous contrast. Multiplanar CT image reconstructions and MIPs were obtained to evaluate the vascular anatomy. CONTRAST:  15mL OMNIPAQUE IOHEXOL 350 MG/ML SOLN COMPARISON:  Correlation made with chest x-ray from the same day  FINDINGS: Cardiovascular: The patient is status post prior CABG. Aortic calcifications are noted. There is no pulmonary embolus identified. The ascending aorta is ectatic measuring up to approximately 4.2 cm in diameter. Dense coronary artery calcifications are noted. Mediastinum/Nodes: No enlarged mediastinal, hilar, or axillary lymph nodes. Thyroid gland, trachea, and esophagus demonstrate no significant findings. There are no pathologically enlarged axillary or supraclavicular lymph nodes. Lungs/Pleura: There is a large right-sided pleural effusion that is likely to some degree loculated. Multiple calcified pleural based plaques are noted primarily at the right lung base. There is some mild areas of pleural thickening superiorly. There is near complete collapse of the right lower lobe. The trachea is unremarkable. There is partial collapse of the right middle lobe. Upper Abdomen: There is cholelithiasis without CT evidence of acute cholecystitis. There is a 2 point 1 cm cystic appearing lesion near the pancreatic body (axial series 5, image 329). The visualized stomach is unremarkable. There may be a small nonobstructing stone versus vascular calcification in the interpolar region of the left kidney. Musculoskeletal: No chest wall abnormality. No acute or significant osseous findings. Review of the MIP images confirms the above findings. IMPRESSION: 1. No PE. 2. Large, likely somewhat loculated, right-sided pleural effusion with near complete collapse of the right lower lobe. 3. Multiple calcified pleural based plaques are noted on the right. There is some mild pleural thickening towards the right lung apex. Findings can be seen in patients with prior asbestos exposure. 4. Ectatic thoracic aorta measuring approximately 4.2 cm in diameter. Recommend annual imaging followup by CTA or MRA. This recommendation follows 2010 ACCF/AHA/AATS/ACR/ASA/SCA/SCAI/SIR/STS/SVM Guidelines for the Diagnosis and Management of  Patients with Thoracic Aortic Disease. Circulation. 2010; 121: D983-J825. Aortic aneurysm NOS (ICD10-I71.9) 5. Cholelithiasis without CT evidence of acute cholecystitis. 6. 2.1 cm cystic pancreatic lesion. Follow-up contrast enhanced MRI is recommended 1 year. This is favored to represent a benign etiology given its appearance. Aortic Atherosclerosis (ICD10-I70.0). Electronically Signed   By: Constance Holster M.D.   On: 05/14/2019 21:26   Dg Chest Port 1 View Result Date: 05/14/2019 CLINICAL DATA:  Shortness of breath, cough EXAM: PORTABLE CHEST 1 VIEW COMPARISON:  06/20/2014, 06/17/2018, 04/09/2017 FINDINGS: There is a small right pleural effusion. There is no left pleural effusion. There is mild right basilar atelectasis. There is no other focal parenchymal opacity. The heart mediastinum are stable. There is evidence of prior CABG. The osseous structures are unremarkable. IMPRESSION: Small right pleural effusion. Electronically Signed   By: Kathreen Devoid   On: 05/14/2019 19:08    Jeffrey [name] was evaluated in Emergency  Department on 05/14/2019 for the symptoms described in the history of present illness. He was evaluated in the context of the global COVID-19 pandemic, which necessitated consideration that the patient might be at risk for infection with the SARS-CoV-2 virus that causes COVID-19. Institutional protocols and algorithms that pertain to the evaluation of patients at risk for COVID-19 are in a state of rapid change based on information released by regulatory bodies including the CDC and federal and state organizations. These policies and algorithms were followed during the patient's care in the ED.     2150:  Pt attempted to ambulate: Sats dropped to 93% R/A, but pt c/o increasing SOB and "nearly passed out." Dx and testing d/w pt.  Questions answered.  Verb understanding, agreeable to admit.   T/C returned from [facility] Dr. Maudie Mercury, case discussed, including:  HPI, pertinent PM/SHx, VS/PE, dx  testing, ED course and treatment:  Agreeable to admit.     Final Clinical Impressions(s) / ED Diagnoses   Final diagnoses:  None    ED Discharge Orders    None       Francine Graven, DO 05/16/19 1530

## 2019-05-14 NOTE — ED Notes (Signed)
Patient transported to CT 

## 2019-05-15 ENCOUNTER — Observation Stay (HOSPITAL_COMMUNITY): Payer: Medicare Other

## 2019-05-15 DIAGNOSIS — K449 Diaphragmatic hernia without obstruction or gangrene: Secondary | ICD-10-CM | POA: Diagnosis present

## 2019-05-15 DIAGNOSIS — N390 Urinary tract infection, site not specified: Secondary | ICD-10-CM | POA: Diagnosis not present

## 2019-05-15 DIAGNOSIS — I214 Non-ST elevation (NSTEMI) myocardial infarction: Secondary | ICD-10-CM | POA: Diagnosis not present

## 2019-05-15 DIAGNOSIS — J929 Pleural plaque without asbestos: Secondary | ICD-10-CM

## 2019-05-15 DIAGNOSIS — I10 Essential (primary) hypertension: Secondary | ICD-10-CM | POA: Diagnosis present

## 2019-05-15 DIAGNOSIS — Z96643 Presence of artificial hip joint, bilateral: Secondary | ICD-10-CM | POA: Diagnosis present

## 2019-05-15 DIAGNOSIS — Z96652 Presence of left artificial knee joint: Secondary | ICD-10-CM | POA: Diagnosis present

## 2019-05-15 DIAGNOSIS — I25110 Atherosclerotic heart disease of native coronary artery with unstable angina pectoris: Secondary | ICD-10-CM | POA: Diagnosis present

## 2019-05-15 DIAGNOSIS — Z955 Presence of coronary angioplasty implant and graft: Secondary | ICD-10-CM | POA: Diagnosis not present

## 2019-05-15 DIAGNOSIS — I200 Unstable angina: Secondary | ICD-10-CM | POA: Diagnosis not present

## 2019-05-15 DIAGNOSIS — Z01810 Encounter for preprocedural cardiovascular examination: Secondary | ICD-10-CM | POA: Diagnosis not present

## 2019-05-15 DIAGNOSIS — I255 Ischemic cardiomyopathy: Secondary | ICD-10-CM | POA: Diagnosis not present

## 2019-05-15 DIAGNOSIS — R7989 Other specified abnormal findings of blood chemistry: Secondary | ICD-10-CM | POA: Diagnosis not present

## 2019-05-15 DIAGNOSIS — I2510 Atherosclerotic heart disease of native coronary artery without angina pectoris: Secondary | ICD-10-CM | POA: Diagnosis not present

## 2019-05-15 DIAGNOSIS — R0902 Hypoxemia: Secondary | ICD-10-CM | POA: Diagnosis not present

## 2019-05-15 DIAGNOSIS — I351 Nonrheumatic aortic (valve) insufficiency: Secondary | ICD-10-CM

## 2019-05-15 DIAGNOSIS — E7800 Pure hypercholesterolemia, unspecified: Secondary | ICD-10-CM | POA: Diagnosis present

## 2019-05-15 DIAGNOSIS — G20 Parkinson's disease: Secondary | ICD-10-CM | POA: Diagnosis present

## 2019-05-15 DIAGNOSIS — I252 Old myocardial infarction: Secondary | ICD-10-CM | POA: Diagnosis not present

## 2019-05-15 DIAGNOSIS — J90 Pleural effusion, not elsewhere classified: Secondary | ICD-10-CM | POA: Diagnosis not present

## 2019-05-15 DIAGNOSIS — E039 Hypothyroidism, unspecified: Secondary | ICD-10-CM | POA: Diagnosis present

## 2019-05-15 DIAGNOSIS — I25810 Atherosclerosis of coronary artery bypass graft(s) without angina pectoris: Secondary | ICD-10-CM | POA: Diagnosis present

## 2019-05-15 DIAGNOSIS — K219 Gastro-esophageal reflux disease without esophagitis: Secondary | ICD-10-CM | POA: Diagnosis present

## 2019-05-15 DIAGNOSIS — I2583 Coronary atherosclerosis due to lipid rich plaque: Secondary | ICD-10-CM | POA: Diagnosis present

## 2019-05-15 DIAGNOSIS — Z20828 Contact with and (suspected) exposure to other viral communicable diseases: Secondary | ICD-10-CM | POA: Diagnosis present

## 2019-05-15 DIAGNOSIS — M159 Polyosteoarthritis, unspecified: Secondary | ICD-10-CM | POA: Diagnosis present

## 2019-05-15 DIAGNOSIS — Z7982 Long term (current) use of aspirin: Secondary | ICD-10-CM | POA: Diagnosis not present

## 2019-05-15 DIAGNOSIS — Z79890 Hormone replacement therapy (postmenopausal): Secondary | ICD-10-CM | POA: Diagnosis not present

## 2019-05-15 DIAGNOSIS — C457 Mesothelioma of other sites: Secondary | ICD-10-CM | POA: Diagnosis present

## 2019-05-15 DIAGNOSIS — M7989 Other specified soft tissue disorders: Secondary | ICD-10-CM | POA: Diagnosis not present

## 2019-05-15 DIAGNOSIS — Z77090 Contact with and (suspected) exposure to asbestos: Secondary | ICD-10-CM | POA: Diagnosis present

## 2019-05-15 DIAGNOSIS — R0602 Shortness of breath: Secondary | ICD-10-CM | POA: Diagnosis present

## 2019-05-15 DIAGNOSIS — F419 Anxiety disorder, unspecified: Secondary | ICD-10-CM | POA: Diagnosis present

## 2019-05-15 DIAGNOSIS — I352 Nonrheumatic aortic (valve) stenosis with insufficiency: Secondary | ICD-10-CM | POA: Diagnosis present

## 2019-05-15 LAB — ECHOCARDIOGRAM COMPLETE
Height: 73 in
Weight: 3542.4 oz

## 2019-05-15 LAB — STREP PNEUMONIAE URINARY ANTIGEN
Strep Pneumo Urinary Antigen: NEGATIVE
Strep Pneumo Urinary Antigen: NEGATIVE

## 2019-05-15 MED ORDER — TIZANIDINE HCL 4 MG PO TABS
4.0000 mg | ORAL_TABLET | Freq: Every day | ORAL | Status: DC
  Administered 2019-05-15 – 2019-05-26 (×12): 4 mg via ORAL
  Filled 2019-05-15 (×13): qty 1

## 2019-05-15 MED ORDER — PIPERACILLIN-TAZOBACTAM 3.375 G IVPB
3.3750 g | Freq: Once | INTRAVENOUS | Status: AC
  Administered 2019-05-15: 3.375 g via INTRAVENOUS
  Filled 2019-05-15: qty 50

## 2019-05-15 MED ORDER — VANCOMYCIN HCL IN DEXTROSE 1-5 GM/200ML-% IV SOLN
1000.0000 mg | INTRAVENOUS | Status: AC
  Administered 2019-05-15: 1000 mg via INTRAVENOUS
  Filled 2019-05-15: qty 200

## 2019-05-15 MED ORDER — VANCOMYCIN HCL IN DEXTROSE 1-5 GM/200ML-% IV SOLN
1000.0000 mg | Freq: Once | INTRAVENOUS | Status: AC
  Administered 2019-05-15: 1000 mg via INTRAVENOUS
  Filled 2019-05-15: qty 200

## 2019-05-15 MED ORDER — PANTOPRAZOLE SODIUM 40 MG PO TBEC
40.0000 mg | DELAYED_RELEASE_TABLET | Freq: Every day | ORAL | Status: DC
  Administered 2019-05-15 – 2019-05-28 (×13): 40 mg via ORAL
  Filled 2019-05-15 (×13): qty 1

## 2019-05-15 MED ORDER — LEVOTHYROXINE SODIUM 100 MCG PO TABS
200.0000 ug | ORAL_TABLET | Freq: Every day | ORAL | Status: DC
  Administered 2019-05-15 – 2019-05-27 (×13): 200 ug via ORAL
  Filled 2019-05-15 (×14): qty 2

## 2019-05-15 MED ORDER — ROPINIROLE HCL 1 MG PO TABS
0.5000 mg | ORAL_TABLET | Freq: Every day | ORAL | Status: DC
  Administered 2019-05-15 – 2019-05-27 (×13): 0.5 mg via ORAL
  Filled 2019-05-15 (×13): qty 1

## 2019-05-15 MED ORDER — FOLIC ACID 1 MG PO TABS
1.0000 mg | ORAL_TABLET | Freq: Every day | ORAL | Status: DC
  Administered 2019-05-15 – 2019-05-28 (×13): 1 mg via ORAL
  Filled 2019-05-15 (×13): qty 1

## 2019-05-15 MED ORDER — ALPRAZOLAM 0.25 MG PO TABS
0.2500 mg | ORAL_TABLET | Freq: Two times a day (BID) | ORAL | Status: DC | PRN
  Administered 2019-05-15 – 2019-05-28 (×18): 0.25 mg via ORAL
  Filled 2019-05-15 (×19): qty 1

## 2019-05-15 MED ORDER — FLUTICASONE FUROATE-VILANTEROL 100-25 MCG/INH IN AEPB
1.0000 | INHALATION_SPRAY | Freq: Every day | RESPIRATORY_TRACT | Status: DC
  Administered 2019-05-15 – 2019-05-28 (×11): 1 via RESPIRATORY_TRACT
  Filled 2019-05-15 (×2): qty 28

## 2019-05-15 MED ORDER — CARBIDOPA-LEVODOPA 25-100 MG PO TABS
1.0000 | ORAL_TABLET | Freq: Three times a day (TID) | ORAL | Status: DC
  Administered 2019-05-15 – 2019-05-28 (×36): 1 via ORAL
  Filled 2019-05-15 (×40): qty 1

## 2019-05-15 MED ORDER — PREDNISONE 10 MG PO TABS
10.0000 mg | ORAL_TABLET | Freq: Every day | ORAL | Status: DC
  Administered 2019-05-15 – 2019-05-28 (×13): 10 mg via ORAL
  Filled 2019-05-15 (×13): qty 1

## 2019-05-15 MED ORDER — ASPIRIN EC 81 MG PO TBEC
81.0000 mg | DELAYED_RELEASE_TABLET | Freq: Every day | ORAL | Status: DC
  Administered 2019-05-15 – 2019-05-28 (×12): 81 mg via ORAL
  Filled 2019-05-15 (×14): qty 1

## 2019-05-15 MED ORDER — LOSARTAN POTASSIUM 50 MG PO TABS
50.0000 mg | ORAL_TABLET | Freq: Every day | ORAL | Status: DC
  Administered 2019-05-15 – 2019-05-22 (×8): 50 mg via ORAL
  Filled 2019-05-15 (×9): qty 1

## 2019-05-15 NOTE — Progress Notes (Signed)
  Echocardiogram 2D Echocardiogram has been performed.  Jeffrey [name] 05/15/2019, 4:05 PM

## 2019-05-15 NOTE — Progress Notes (Signed)
ANTIBIOTIC CONSULT NOTE-Preliminary  Pharmacy Consult for vancomycin, zosyn Indication: R pleural effusion , partially loculated, ? infectious, JUST need 1 dose, while awaiting thoracentesis  Allergies  Allergen Reactions  . Lipitor [Atorvastatin] Other (See Comments)    muscle spasms cramps  . Methylprednisolone Other (See Comments)    cramps cramps  . Other     If patient is to receive blood - blood must be treated witgh radiation because his body will not accept a normal infusion   . Sulfa Antibiotics Itching  . Doxycycline Rash    Doxycycline caused itching redness on scalp and head Doxycycline caused itching redness on scalp and head    Patient Measurements: Height: 6\' 1"  (185.4 cm) Weight: 220 lb (99.8 kg) IBW/kg (Calculated) : 79.9 Adjusted Body Weight:   Vital Signs: Temp: 97.9 F (36.6 C) (05/23 1756) Temp Source: Oral (05/23 1756) BP: 165/92 (05/23 2300) Pulse Rate: 81 (05/23 2330)  Labs: Recent Labs    05/14/19 1810  WBC 10.1  HGB 13.9  PLT 302  CREATININE 1.23    Estimated Creatinine Clearance: 57.6 mL/min (by C-G formula based on SCr of 1.23 mg/dL).  No results for input(s): VANCOTROUGH, VANCOPEAK, VANCORANDOM, GENTTROUGH, GENTPEAK, GENTRANDOM, TOBRATROUGH, TOBRAPEAK, TOBRARND, AMIKACINPEAK, AMIKACINTROU, AMIKACIN in the last 72 hours.   Microbiology: Recent Results (from the past 720 hour(s))  SARS Coronavirus 2 (CEPHEID - Performed in [place] hospital lab), Hosp Order     Status: None   Collection Time: 05/14/19  6:40 PM  Result Value Ref Range Status   SARS Coronavirus 2 NEGATIVE NEGATIVE Final    Comment: (NOTE) If result is NEGATIVE SARS-CoV-2 target nucleic acids are NOT DETECTED. The SARS-CoV-2 RNA is generally detectable in upper and lower  respiratory specimens during the acute phase of infection. The lowest  concentration of SARS-CoV-2 viral copies this assay can detect is 250  copies / mL. A negative result does not preclude  SARS-CoV-2 infection  and should not be used as the sole basis for treatment or other  patient management decisions.  A negative result may occur with  improper specimen collection / handling, submission of specimen other  than nasopharyngeal swab, presence of viral mutation(s) within the  areas targeted by this assay, and inadequate number of viral copies  (<250 copies / mL). A negative result must be combined with clinical  observations, patient history, and epidemiological information. If result is POSITIVE SARS-CoV-2 target nucleic acids are DETECTED. The SARS-CoV-2 RNA is generally detectable in upper and lower  respiratory specimens dur ing the acute phase of infection.  Positive  results are indicative of active infection with SARS-CoV-2.  Clinical  correlation with patient history and other diagnostic information is  necessary to determine patient infection status.  Positive results do  not rule out bacterial infection or co-infection with other viruses. If result is PRESUMPTIVE POSTIVE SARS-CoV-2 nucleic acids MAY BE PRESENT.   A presumptive positive result was obtained on the submitted specimen  and confirmed on repeat testing.  While 2019 novel coronavirus  (SARS-CoV-2) nucleic acids may be present in the submitted sample  additional confirmatory testing may be necessary for epidemiological  and / or clinical management purposes  to differentiate between  SARS-CoV-2 and other Sarbecovirus currently known to infect humans.  If clinically indicated additional testing with an alternate test  methodology [phone]) is advised. The SARS-CoV-2 RNA is generally  detectable in upper and lower respiratory sp ecimens during the acute  phase of infection. The expected result is  Negative. Fact Sheet for Patients:  StrictlyIdeas.no Fact Sheet for Healthcare Providers: BankingDealers.co.za This test is not yet approved or cleared by the  [place] FDA and has been authorized for detection and/or diagnosis of SARS-CoV-2 by FDA under an Emergency Use Authorization (EUA).  This EUA will remain in effect (meaning this test can be used) for the duration of the COVID-19 declaration under Section 564(b)(1) of the Act, 21 U.S.C. section 360bbb-3(b)(1), unless the authorization is terminated or revoked sooner. Performed at [facility], [address], [place], [place] [address]     Medical History: Past Medical History:  Diagnosis Date  . Anxiety   . Aortic stenosis    mild AS by 07/20/13 echo ([facility])  . Arthritis    "all over"  . Coronary artery disease    NSTEMI 06/2010, CABG x3=> [place]->LAD, SVG->OM1, SVG->PDA, DES LCx 10/2011, DES LAD 12/2011  . Depression   . GERD (gastroesophageal reflux disease)   . H/O hiatal hernia   . Hearing aid worn    B/L  . High cholesterol   . History of blood transfusion reaction 03-14-2013   "he almost died; he has to get irradiated blood next time"  . Hypertension   . Hypothyroidism   . Ischemic cardiomyopathy   . ITP (idiopathic thrombocytopenic purpura)    Dr. Gaynelle Arabian, on [place]  . Myocardial infarction ([place]) 03-14-2010  . Pneumonia 1940's X 1; March 14, 2014 X 1  . Rheumatoid arthritis ([place])   . Shortness of breath    with exertion, has not been very active  . Sleep apnea   . Wears glasses   . Wears partial dentures     Medications:  Anti-infectives (From admission, onward)   Start     Dose/Rate Route Frequency Ordered Stop   05/15/19 0015  piperacillin-tazobactam (ZOSYN) IVPB 3.375 g     3.375 g 12.5 mL/hr over 240 Minutes Intravenous  Once 05/15/19 0008     05/15/19 0015  vancomycin (VANCOCIN) IVPB 1000 mg/200 mL premix     1,000 mg 200 mL/hr over 60 Minutes Intravenous Every 1 hr x 2 05/15/19 0008 05/15/19 0214      Assessment: 85 yo male seen in ED with pleural effusion on R, has CAD, parkinson's.  Pt awaiting thoracentesis, pulmonary following.  Will be  [facility]ring to [facility].  Was asked to give 1 dose of zosyn and vancomycin for possible infectious process while awaiting thoracentesis.      Plan:  Give vancomycin 2 grams x 1, zosyn 3.375 grams x 1   Viveca Beckstrom, Magdalene Molly, [facility] 05/15/2019,12:09 AM

## 2019-05-15 NOTE — ED Notes (Signed)
Pts Daughter Scott Vanderveer [phone].  Pts grandson Cheri Rous [phone].

## 2019-05-15 NOTE — Consult Note (Signed)
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  Patient reports 10lb wt loss in 1 month but attributes it to some esophageal concerns.  No hemoptysis or cough.  No fever or chills.  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  Patient reports 10lb wt loss in 1 month but attributes it to some esophageal concerns.  No hemoptysis or cough.  No fever or chills.  Past Medical History  As below  Significant Hospital Events   5/23 with CT of the chest with loculated pleural effusion  Consults:  PCCM  Procedures:  N/A  Significant Diagnostic Tests:  CT of the chest that I reviewed myself with pleural effusion on the right and pleural plaques  Micro Data:  N/A  Antimicrobials:  N/A   Interim history/subjective:  No events overnight, no new complaints  Objective   Blood pressure 126/83, pulse 66, temperature 98.1 F (36.7 C), temperature source Oral, resp. rate 18, height 6\' 1"  (1.854 m), weight 100.4 kg, SpO2 93 %.        Intake/Output Summary (Last 24 hours) at 05/15/2019 1127 Last data filed at 05/15/2019 1031 Gross per 24 hour  Intake 372.1 ml  Output 525 ml  Net -152.9 ml   Filed Weights   05/14/19 1753 05/15/19 0035  Weight: 99.8 kg 100.4 kg    Examination: General: Chronically ill appearing male, tremulous HENT: [place]/AT, PERRL, EOM-I and MMM Lungs: Decrease BS on the right but otherwise clear Cardiovascular:  RRR, Nl S1/S2 and -M/R/G Abdomen: Soft, NT, ND and +BS Extremities: -edema and -tenderness Neuro: Alert and interactive, moving all ext to command, tremulous Skin: thin but intact  Resolved Hospital Problem list   N/A  Assessment & Plan:  85 year old male with asbestos exposure and right sided semi-loculated pleural effusion.    Pleural effusion:  - Hold lovenox overnight  - Plan thora in AM  - Fluid analysis  Pleural plaque:  - Concern for meso  - Fluid cytology  - If negative will repeat thora, if remains negative then will need CVTS for pleural biopsy  Hypoxemia:  - Titrate O2 for sat of 88-92%  - May need an ambulatory desaturation study prior to discharge for ?of home  Labs   CBC: Recent Labs  Lab 05/14/19 1810  WBC 10.1  NEUTROABS 8.1*  HGB 13.9  HCT 42.4  MCV 95.1  PLT 124    Basic Metabolic Panel: Recent Labs  Lab 05/14/19 1810  NA 139  K 4.6  CL 104  CO2 21*  GLUCOSE 142*  BUN 18  CREATININE 1.23  CALCIUM 9.3   GFR: Estimated Creatinine Clearance: 57.7 mL/min (by C-G formula based on SCr of 1.23 mg/dL). Recent Labs  Lab 05/14/19 1810 05/14/19 1841 05/14/19 2024  WBC 10.1  --   --   LATICACIDVEN  --  1.4 1.3    Liver Function Tests:  No results for input(s): AST, ALT, ALKPHOS, BILITOT, PROT, ALBUMIN in the last 168 hours. No results for input(s): LIPASE, AMYLASE in the last 168 hours. No results for input(s): AMMONIA in the last 168 hours.  ABG No results found for: PHART, PCO2ART, PO2ART, HCO3, TCO2, ACIDBASEDEF, O2SAT   Coagulation Profile: Recent Labs  Lab 05/14/19 1810  INR 1.0    Cardiac Enzymes: Recent Labs  Lab 05/14/19 1810  TROPONINI <0.03    HbA1C: No results found for: HGBA1C  CBG: No results for input(s): GLUCAP in the last 168 hours.  Review of Systems:   12 point ROS is negative other than above  Past Medical History  He,  has a past medical history of Anxiety, Aortic stenosis, Arthritis, Coronary  artery disease, Depression, GERD (gastroesophageal reflux disease), H/O hiatal hernia, Hearing aid worn, High cholesterol, History of blood transfusion reaction (2014), Hypertension, Hypothyroidism, Ischemic cardiomyopathy, ITP (idiopathic thrombocytopenic purpura), Myocardial infarction ([place]) (2011), Pneumonia (1940's X 1; 2015 X 1), Rheumatoid arthritis ([place]), Shortness of breath, Sleep apnea, Wears glasses, and Wears partial dentures.   Surgical History    Past Surgical History:  Procedure Laterality Date  . APPENDECTOMY    . BACK SURGERY    . CATARACT EXTRACTION, BILATERAL    . COLONOSCOPY    . CORONARY ANGIOPLASTY WITH STENT PLACEMENT     DES Lcx 10/2011, DES LAD 12/2011  . CORONARY ARTERY BYPASS GRAFT  2011   "CABG X3"  . GASTROC RECESSION EXTREMITY Right 07/06/2015   Pasty Spillers (orthopedics- [place], [place])  . HERNIA REPAIR    . HIATAL HERNIA REPAIR    . KNEE ARTHROSCOPY Left 06/22/2014   w/I&D  . KNEE ARTHROSCOPY Left 06/22/2014   Procedure: [place];  Surgeon: Hessie Dibble, MD;  Location: [place];  Service: Orthopedics;  Laterality: Left;  . LUMBAR DISC SURGERY  1960's?  . MULTIPLE TOOTH EXTRACTIONS    . SHOULDER ARTHROSCOPY Right 04/09/2017   Procedure: ARTHROSCOPY SHOULDER;  Surgeon: Melrose Nakayama, MD;  Location: [facility];  Service: Orthopedics;  Laterality: Right;  . STERNAL CLOSURE     "wires from OHS taken out; plate put in" (1/0/6269)  . SYNOVECTOMY Left 08/17/2014   Procedure: SYNOVECTOMY LEFT KNEE;  Surgeon: Hessie Dibble, MD;  Location: [place];  Service: Orthopedics;  Laterality: Left;  . TOTAL ANKLE ARTHROPLASTY Right 07/06/2015   Pasty Spillers (orthopedics- [facility])  . TOTAL HIP ARTHROPLASTY Right 06/29/2018   Procedure: RIGHT TOTAL HIP ARTHROPLASTY ANTERIOR APPROACH;  Surgeon: Melrose Nakayama, MD;  Location: [facility];  Service: Orthopedics;  Laterality: Right;  . TOTAL HIP ARTHROPLASTY Left 06/09/2016   Scott Streater Claiborne Billings  (orthopedics- [place])  . TOTAL KNEE ARTHROPLASTY Left 11/24/2014   Procedure: TOTAL KNEE ARTHROPLASTY;  Surgeon: Hessie Dibble, MD;  Location: [place];  Service: Orthopedics;  Laterality: Left;     Social History   reports that he quit smoking about 52 years ago. His smoking use included cigarettes. He has a 15.00 pack-year smoking history. He has quit using smokeless tobacco.  His smokeless tobacco use included chew. He reports that he does not drink alcohol or use drugs.   Family History   His family history includes Alzheimer's disease in his father; Arthritis in an other family member; Healthy in his son; Heart disease in his mother and another family member; Rheum arthritis in his brother and sister.   Allergies Allergies  Allergen Reactions  . Lipitor [Atorvastatin] Other (See Comments)  muscle spasms cramps  . Methylprednisolone Other (See Comments)    cramps cramps  . Other     If patient is to receive blood - blood must be treated witgh radiation because his body will not accept a normal infusion   . Sulfa Antibiotics Itching  . Doxycycline Rash    Doxycycline caused itching redness on scalp and head Doxycycline caused itching redness on scalp and head     Home Medications  Prior to Admission medications   Medication Sig Start Date End Date Taking? Authorizing Provider  ALPRAZolam (XANAX) 0.25 MG tablet Take 1 tablet (0.25 mg total) by mouth 2 (two) times daily as needed for anxiety. 07/02/18  Yes Loni Dolly, PA-C  aspirin EC 81 MG tablet Take 81 mg by mouth daily.   Yes [provider]  azithromycin (ZITHROMAX) 250 MG tablet Take 250-500 mg by mouth See admin instructions. Starting on 05/12/2019 take 500mg  on day 1 then take 250mg  on days 2 through 5   Yes [provider]  carbidopa-levodopa (SINEMET IR) 25-100 MG tablet Take 1 tablet by mouth 3 (three) times daily. 12/02/18  Yes Tat, Eustace Quail, DO  Fluticasone-Salmeterol (ADVAIR) 100-50 MCG/DOSE  AEPB Inhale 1 puff into the lungs daily.   Yes [provider]  folic acid (FOLVITE) 1 MG tablet Take 1 mg by mouth daily.   Yes [provider]  levothyroxine (SYNTHROID, LEVOTHROID) 200 MCG tablet Take 200 mcg by mouth at bedtime.    Yes [provider]  losartan (COZAAR) 50 MG tablet Take 50 mg by mouth at bedtime. 05/28/18  Yes [provider]  methotrexate (RHEUMATREX) 2.5 MG tablet Take 15 mg by mouth once a week. Caution:Chemotherapy. Protect from light.   Yes [provider]  Omega-3 Fatty Acids (FISH OIL) 1200 MG CAPS Take 1,200 mg by mouth at bedtime. 360 MG OMEGA-3   Yes [provider]  omeprazole (PRILOSEC) 40 MG capsule Take 40 mg by mouth at bedtime.    Yes [provider]  predniSONE (DELTASONE) 10 MG tablet Take 10 mg by mouth daily with breakfast.   Yes [provider]  rOPINIRole (REQUIP) 0.5 MG tablet Take 0.5 mg by mouth at bedtime.   Yes [provider]  tiZANidine (ZANAFLEX) 4 MG tablet Take 1 tablet (4 mg total) by mouth every 6 (six) hours as needed for muscle spasms. Patient taking differently: Take 4 mg by mouth at bedtime.  07/01/18 07/01/19 Yes Loni Dolly, PA-C  Vitamin D, Ergocalciferol, (DRISDOL) 1.25 MG (50000 UT) CAPS capsule Take 50,000 Units by mouth every 7 (seven) days.   Yes [provider]    Discussed with PCCM-NP  Rush Farmer, M.D. [facility] Pulmonary/Critical Care Medicine. Pager: [phone]. After hours pager: [phone].

## 2019-05-15 NOTE — Progress Notes (Signed)
Progress Note    Jeffrey [name]  TIW:[id number] DOB: Jul 19, 1934  DOA: 05/14/2019 PCP: [name], MD    Brief Narrative:   Medical records reviewed and are as summarized below:  Jeffrey [name] is an 85 y.o. male w CAD s/p CABG, ischemic CM,  mild Aortic stenosis, Asthma (mild), OSA, Hypothyroidism,  Parkinsons, Anxiety, remote hx of MRSE septic arthritis left knee s/p L knee ID/ synovectomy, L THA, Anemia/ ITP apparently presents with c/o cough with white->slight yellow sputum since January of this year.  Pt notes dyspnea for the past 4 weeks. Worse over the past 3-4 day  Assessment/Plan:   Principal Problem:   Pleural effusion on right Active Problems:   CAD (coronary artery disease) of artery bypass graft   Hypothyroidism   PD (Parkinson's disease) (HCC)   Pancreatic lesion  Right pleural effusion Blood culture x2 Sputum culture Appreciate Pulmonary consult-- plan for thoracentesis in AM (lovenox on hold): concern for mesothelioma (if cytology negative the plan is to repeat thoro) -vanc and zosyn given x 1 in ER  CAD s/p CABG, DES Lcx, DES LAD Cont Aspirin 81mg  po qday Cont Losartan 50mg  po qday  Copd Cont Advair=> Breo 1puff qday Cont albuterol neb bid  H/o Rheumatoid arthritis HOLD methotrexate Cont Prednisone 10mg  po qday Cont Folic acid 1mg  po qday  Hypothyroidism Cont Levothyroxine 282micrograms po qday   Anxiety Xanax 0.25mg  po bid prn  Parkinsons Cont Sinemet IR 25/100mg  po tid  RLS Cont Ropinirole 0.5mg  po qhs Cont Zanaflex 4mg  po qhs  Gerd Cont PPI  Pancreatic lesion - MRI as outpatient   Family Communication/Anticipated D/C date and plan/Code Status   DVT prophylaxis: Lovenox held until after procedure-- SCDs for now Code Status: Full Code.  Family Communication:  Disposition Plan: needs thoracentesis in AM   Medical Consultants:    None.   Anti-Infectives:    None  Subjective:   Very short of breath   Objective:    Vitals:   05/14/19 2330 05/15/19 0035 05/15/19 0450 05/15/19 0738  BP:  136/70 117/75 126/83  Pulse: 81 74 63 66  Resp: 17 20 18 18   Temp:  98 F (36.7 C) (!) 97.4 F (36.3 C) 98.1 F (36.7 C)  TempSrc:  Oral Oral Oral  SpO2: 95% 94% 93% 93%  Weight:  100.4 kg    Height:  6\' 1"  (1.854 m)      Intake/Output Summary (Last 24 hours) at 05/15/2019 1206 Last data filed at 05/15/2019 1031 Gross per 24 hour  Intake 372.1 ml  Output 525 ml  Net -152.9 ml   Filed Weights   05/14/19 1753 05/15/19 0035  Weight: 99.8 kg 100.4 kg    Exam: In bed- chronically ill appearing Appears winded at rest Markedly decreased breath sounds on right lung, left lung clear A+OX3, hard of hearing Tremors Min LE edema   Data Reviewed:   I have personally reviewed following labs and imaging studies:  Labs: Labs show the following:   Basic Metabolic Panel: Recent Labs  Lab 05/14/19 1810  NA 139  K 4.6  CL 104  CO2 21*  GLUCOSE 142*  BUN 18  CREATININE 1.23  CALCIUM 9.3   GFR Estimated Creatinine Clearance: 57.7 mL/min (by C-G formula based on SCr of 1.23 mg/dL). Liver Function Tests: No results for input(s): AST, ALT, ALKPHOS, BILITOT, PROT, ALBUMIN in the last 168 hours. No results for input(s): LIPASE, AMYLASE in the last 168 hours. No results for input(s):  AMMONIA in the last 168 hours. Coagulation profile Recent Labs  Lab 05/14/19 1810  INR 1.0    CBC: Recent Labs  Lab 05/14/19 1810  WBC 10.1  NEUTROABS 8.1*  HGB 13.9  HCT 42.4  MCV 95.1  PLT 302   Cardiac Enzymes: Recent Labs  Lab 05/14/19 1810  TROPONINI <0.03   BNP (last 3 results) No results for input(s): PROBNP in the last 8760 hours. CBG: No results for input(s): GLUCAP in the last 168 hours. D-Dimer: No results for input(s): DDIMER in the last 72 hours. Hgb A1c: No results for input(s): HGBA1C in the last 72 hours. Lipid Profile: No results for input(s): CHOL, HDL, LDLCALC,  TRIG, CHOLHDL, LDLDIRECT in the last 72 hours. Thyroid function studies: No results for input(s): TSH, T4TOTAL, T3FREE, THYROIDAB in the last 72 hours.  Invalid input(s): FREET3 Anemia work up: No results for input(s): VITAMINB12, FOLATE, FERRITIN, TIBC, IRON, RETICCTPCT in the last 72 hours. Sepsis Labs: Recent Labs  Lab 05/14/19 1810 05/14/19 1841 05/14/19 2024  WBC 10.1  --   --   LATICACIDVEN  --  1.4 1.3    Microbiology Recent Results (from the past 240 hour(s))  SARS Coronavirus 2 (CEPHEID - Performed in [place] hospital lab), Hosp Order     Status: None   Collection Time: 05/14/19  6:40 PM  Result Value Ref Range Status   SARS Coronavirus 2 NEGATIVE NEGATIVE Final    Comment: (NOTE) If result is NEGATIVE SARS-CoV-2 target nucleic acids are NOT DETECTED. The SARS-CoV-2 RNA is generally detectable in upper and lower  respiratory specimens during the acute phase of infection. The lowest  concentration of SARS-CoV-2 viral copies this assay can detect is 250  copies / mL. A negative result does not preclude SARS-CoV-2 infection  and should not be used as the sole basis for treatment or other  patient management decisions.  A negative result may occur with  improper specimen collection / handling, submission of specimen other  than nasopharyngeal swab, presence of viral mutation(s) within the  areas targeted by this assay, and inadequate number of viral copies  (<250 copies / mL). A negative result must be combined with clinical  observations, patient history, and epidemiological information. If result is POSITIVE SARS-CoV-2 target nucleic acids are DETECTED. The SARS-CoV-2 RNA is generally detectable in upper and lower  respiratory specimens dur ing the acute phase of infection.  Positive  results are indicative of active infection with SARS-CoV-2.  Clinical  correlation with patient history and other diagnostic information is  necessary to determine patient  infection status.  Positive results do  not rule out bacterial infection or co-infection with other viruses. If result is PRESUMPTIVE POSTIVE SARS-CoV-2 nucleic acids MAY BE PRESENT.   A presumptive positive result was obtained on the submitted specimen  and confirmed on repeat testing.  While 2019 novel coronavirus  (SARS-CoV-2) nucleic acids may be present in the submitted sample  additional confirmatory testing may be necessary for epidemiological  and / or clinical management purposes  to differentiate between  SARS-CoV-2 and other Sarbecovirus currently known to infect humans.  If clinically indicated additional testing with an alternate test  methodology [phone]) is advised. The SARS-CoV-2 RNA is generally  detectable in upper and lower respiratory sp ecimens during the acute  phase of infection. The expected result is Negative. Fact Sheet for Patients:  StrictlyIdeas.no Fact Sheet for Healthcare Providers: BankingDealers.co.za This test is not yet approved or cleared by the [place] FDA  and has been authorized for detection and/or diagnosis of SARS-CoV-2 by FDA under an Emergency Use Authorization (EUA).  This EUA will remain in effect (meaning this test can be used) for the duration of the COVID-19 declaration under Section 564(b)(1) of the Act, 21 U.S.C. section 360bbb-3(b)(1), unless the authorization is terminated or revoked sooner. Performed at [facility], [address] [place], [place] [address]     Procedures and diagnostic studies:  Ct Angio Chest Pe W/cm &/or Wo Cm  Result Date: 05/14/2019 CLINICAL DATA:  Shortness of breath EXAM: CT ANGIOGRAPHY CHEST WITH CONTRAST TECHNIQUE: Multidetector CT imaging of the chest was performed using the standard protocol during bolus administration of intravenous contrast. Multiplanar CT image reconstructions and MIPs were obtained to evaluate the vascular anatomy. CONTRAST:   19mL OMNIPAQUE IOHEXOL 350 MG/ML SOLN COMPARISON:  Correlation made with chest x-ray from the same day FINDINGS: Cardiovascular: The patient is status post prior CABG. Aortic calcifications are noted. There is no pulmonary embolus identified. The ascending aorta is ectatic measuring up to approximately 4.2 cm in diameter. Dense coronary artery calcifications are noted. Mediastinum/Nodes: No enlarged mediastinal, hilar, or axillary lymph nodes. Thyroid gland, trachea, and esophagus demonstrate no significant findings. There are no pathologically enlarged axillary or supraclavicular lymph nodes. Lungs/Pleura: There is a large right-sided pleural effusion that is likely to some degree loculated. Multiple calcified pleural based plaques are noted primarily at the right lung base. There is some mild areas of pleural thickening superiorly. There is near complete collapse of the right lower lobe. The trachea is unremarkable. There is partial collapse of the right middle lobe. Upper Abdomen: There is cholelithiasis without CT evidence of acute cholecystitis. There is a 2 point 1 cm cystic appearing lesion near the pancreatic body (axial series 5, image 329). The visualized stomach is unremarkable. There may be a small nonobstructing stone versus vascular calcification in the interpolar region of the left kidney. Musculoskeletal: No chest wall abnormality. No acute or significant osseous findings. Review of the MIP images confirms the above findings. IMPRESSION: 1. No PE. 2. Large, likely somewhat loculated, right-sided pleural effusion with near complete collapse of the right lower lobe. 3. Multiple calcified pleural based plaques are noted on the right. There is some mild pleural thickening towards the right lung apex. Findings can be seen in patients with prior asbestos exposure. 4. Ectatic thoracic aorta measuring approximately 4.2 cm in diameter. Recommend annual imaging followup by CTA or MRA. This recommendation  follows 2010 ACCF/AHA/AATS/ACR/ASA/SCA/SCAI/SIR/STS/SVM Guidelines for the Diagnosis and Management of Patients with Thoracic Aortic Disease. Circulation. 2010; 121: I338-S505. Aortic aneurysm NOS (ICD10-I71.9) 5. Cholelithiasis without CT evidence of acute cholecystitis. 6. 2.1 cm cystic pancreatic lesion. Follow-up contrast enhanced MRI is recommended 1 year. This is favored to represent a benign etiology given its appearance. Aortic Atherosclerosis (ICD10-I70.0). Electronically Signed   By: Constance Holster M.D.   On: 05/14/2019 21:26   Dg Chest Port 1 View  Result Date: 05/14/2019 CLINICAL DATA:  Shortness of breath, cough EXAM: PORTABLE CHEST 1 VIEW COMPARISON:  06/20/2014, 06/17/2018, 04/09/2017 FINDINGS: There is a small right pleural effusion. There is no left pleural effusion. There is mild right basilar atelectasis. There is no other focal parenchymal opacity. The heart mediastinum are stable. There is evidence of prior CABG. The osseous structures are unremarkable. IMPRESSION: Small right pleural effusion. Electronically Signed   By: Kathreen Devoid   On: 05/14/2019 19:08    Medications:   . aspirin EC  81 mg Oral  Daily  . carbidopa-levodopa  1 tablet Oral TID  . fluticasone furoate-vilanterol  1 puff Inhalation Daily  . folic acid  1 mg Oral Daily  . levothyroxine  200 mcg Oral QHS  . losartan  50 mg Oral QHS  . pantoprazole  40 mg Oral Daily  . predniSONE  10 mg Oral Q breakfast  . rOPINIRole  0.5 mg Oral QHS  . sodium chloride flush  3 mL Intravenous Q12H  . tiZANidine  4 mg Oral QHS   Continuous Infusions: . sodium chloride       LOS: 0 days   Geradine Girt  [facility]   How to contact the [facility] Attending or Consulting provider [place] or covering provider during after hours [place], for this patient?  1. Check the care team in [facility] and look for a) attending/consulting [facility] provider listed and b) the [facility] team listed 2. Log into [email or web address] and use [place]'s  universal password to access. If you do not have the password, please contact the hospital operator. 3. Locate the [facility] provider you are looking for under [facility] and page to a number that you can be directly reached. 4. If you still have difficulty reaching the provider, please page the [facility] (Director on Call) for the Hospitalists listed on amion for assistance.  05/15/2019, 12:06 PM

## 2019-05-16 ENCOUNTER — Inpatient Hospital Stay (HOSPITAL_COMMUNITY): Payer: Medicare Other

## 2019-05-16 LAB — BODY FLUID CELL COUNT WITH DIFFERENTIAL
Eos, Fluid: 1 %
Lymphs, Fluid: 97 %
Monocyte-Macrophage-Serous Fluid: 0 % — ABNORMAL LOW (ref 50–90)
Neutrophil Count, Fluid: 2 % (ref 0–25)
Total Nucleated Cell Count, Fluid: 1028 cu mm — ABNORMAL HIGH (ref 0–1000)

## 2019-05-16 LAB — BASIC METABOLIC PANEL
Anion gap: 9 (ref 5–15)
BUN: 18 mg/dL (ref 8–23)
CO2: 27 mmol/L (ref 22–32)
Calcium: 9.1 mg/dL (ref 8.9–10.3)
Chloride: 102 mmol/L (ref 98–111)
Creatinine, Ser: 1.43 mg/dL — ABNORMAL HIGH (ref 0.61–1.24)
GFR calc Af Amer: 52 mL/min — ABNORMAL LOW (ref >60)
GFR calc non Af Amer: 45 mL/min — ABNORMAL LOW (ref >60)
Glucose, Bld: 119 mg/dL — ABNORMAL HIGH (ref 70–99)
Potassium: 3.9 mmol/L (ref 3.5–5.1)
Sodium: 138 mmol/L (ref 135–145)

## 2019-05-16 LAB — CBC
HCT: 40.9 % (ref 39.0–52.0)
Hemoglobin: 13.5 g/dL (ref 13.0–17.0)
MCH: 31.1 pg (ref 26.0–34.0)
MCHC: 33 g/dL (ref 30.0–36.0)
MCV: 94.2 fL (ref 80.0–100.0)
Platelets: 292 10*3/uL (ref 150–400)
RBC: 4.34 MIL/uL (ref 4.22–5.81)
RDW: 15 % (ref 11.5–15.5)
WBC: 9.3 10*3/uL (ref 4.0–10.5)
nRBC: 0 % (ref 0.0–0.2)

## 2019-05-16 LAB — LACTATE DEHYDROGENASE, PLEURAL OR PERITONEAL FLUID: LD, Fluid: 320 U/L — ABNORMAL HIGH (ref 3–23)

## 2019-05-16 LAB — LEGIONELLA PNEUMOPHILA SEROGP 1 UR AG
L. pneumophila Serogp 1 Ur Ag: NEGATIVE
L. pneumophila Serogp 1 Ur Ag: NEGATIVE

## 2019-05-16 LAB — PROTEIN, PLEURAL OR PERITONEAL FLUID: Total protein, fluid: 4 g/dL

## 2019-05-16 LAB — GLUCOSE, PLEURAL OR PERITONEAL FLUID: Glucose, Fluid: 54 mg/dL

## 2019-05-16 LAB — PROTEIN, TOTAL: Total Protein: 6.2 g/dL — ABNORMAL LOW (ref 6.5–8.1)

## 2019-05-16 LAB — LACTATE DEHYDROGENASE: LDH: 150 U/L (ref 98–192)

## 2019-05-16 MED ORDER — SODIUM CHLORIDE 0.9 % IV SOLN
INTRAVENOUS | Status: DC
  Administered 2019-05-16 – 2019-05-19 (×3): via INTRAVENOUS

## 2019-05-16 NOTE — Procedures (Signed)
Thoracentesis Procedure Note  Pre-operative Diagnosis: Pleural effusion, asbestos exposure with pleural plaque  Post-operative Diagnosis: same  Indications: pleural effusion, RT  Procedure Details  Consent: Informed consent was obtained. Risks of the procedure were discussed including: infection, bleeding, pain, pneumothorax.  Under sterile conditions the patient was positioned. Betadine solution and sterile drapes were utilized.  2% buffered lidocaine was used to anesthetize the 7th rib space. Fluid was obtained without any difficulties and minimal blood loss.  A dressing was applied to the wound and wound care instructions were provided.   Findings 1000 ml of bloody pleural fluid was obtained. A sample was sent to Pathology for cytology and cell counts, as well as for chemistry & infection analysis.  Complications:  None; patient tolerated the procedure well.          Condition: stable  Plan A follow up chest x-ray was ordered. Bed Rest for 4 hours. Tylenol 650 mg. for pain.  Attending Attestation: I was present for the entire procedure. Procedure performed by babcock NP  Leanna Sato. Elsworth Soho MD

## 2019-05-16 NOTE — Progress Notes (Signed)
   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  Patient reports 10lb wt loss in 1 month but attributes it to some esophageal concerns.  No hemoptysis or cough.  No fever or chills.  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  Patient reports 10lb wt loss in 1 month but attributes it to some esophageal concerns.  No hemoptysis or cough.  No fever or chills.  Past Medical History  As below  Significant Hospital Events   5/23 with CT of the chest with loculated pleural effusion  Consults:  PCCM  Procedures:  N/A  Significant Diagnostic Tests:  CT of the chest  (reviewed)  with pleural effusion on the right and pleural plaques  Micro Data:  N/A  Antimicrobials:  N/A   Interim history/subjective:  No events overnight, no new complaints  Objective   Blood pressure (!) 157/140, pulse 69, temperature 98.3 F (36.8 C), temperature source Oral, resp. rate 17, height 6\' 1"  (1.854 m), weight 98.7 kg, SpO2 94 %.        Intake/Output Summary (Last 24 hours) at 05/16/2019 1153 Last data filed at 05/16/2019 1000 Gross per 24 hour  Intake 947 ml  Output 975 ml  Net -28 ml   Filed Weights   05/14/19 1753 05/15/19 0035 05/16/19 0539  Weight: 99.8 kg 100.4 kg 98.7 kg    Examination: General: Chronically ill appearing male, tremulous HENT: Jeffrey [name]/AT, PERRL, EOM-I and MMM Lungs: Decrease BS on the right but otherwise clear  Cardiovascular: RRR, Nl S1/S2 and -M/R/G Abdomen: Soft, NT, ND and +BS Extremities: -edema and -tenderness Neuro: Alert and interactive, nonfocal, bilateral coarse tremors Skin: thin but intact  Resolved Hospital Problem list   N/A  Assessment & Plan:  85 year old male with asbestos exposure and right sided semi-loculated pleural effusion.    Pleural effusion:  -Risks and benefits of thoracentesis discussed with patient he is willing to proceed  - Fluid analysis including cytology  Pleural plaque:  - Concern for meso, could be simply related to asbestos exposure or asbestosis    Jeffrey [name] V. Elsworth Soho MD [phone]

## 2019-05-16 NOTE — Progress Notes (Signed)
No discomfort, no c/o. Resting in bd. IV fluids infusing per MD order.

## 2019-05-16 NOTE — Progress Notes (Signed)
House Coverage Supervisor notified that MD will be performing  A thoracentesis for right pleural effusion at the bedside.

## 2019-05-16 NOTE — Progress Notes (Signed)
Progress Note    SANKALP FERRELL  TGG:[id number] DOB: December 27, 1933  DOA: 05/14/2019 PCP: [name], MD    Brief Narrative:   Medical records reviewed and are as summarized below:  ROHIN KREJCI is an 85 y.o. male w CAD s/p CABG, ischemic CM,  mild Aortic stenosis, Asthma (mild), OSA, Hypothyroidism,  Parkinsons, Anxiety, remote hx of MRSE septic arthritis left knee s/p L knee ID/ synovectomy, L THA, Anemia/ ITP apparently presents with c/o cough with white->slight yellow sputum since January of this year.  Pt notes dyspnea for the past 4 weeks. Worse over the past 3-4 day  Assessment/Plan:   Principal Problem:   Pleural effusion on right Active Problems:   CAD (coronary artery disease) of artery bypass graft   Hypothyroidism   PD (Parkinson's disease) (HCC)   Pancreatic lesion  Right pleural effusion Sputum culture Appreciate Pulmonary consult-- plan for thoracentesis  (lovenox on hold): concern for mesothelioma (if cytology negative the plan is to repeat thoro) -vanc and zosyn given x 1 in ER -echo: EF 55%, some impaired relaxation  CAD s/p CABG, DES Lcx, DES LAD Cont Aspirin 81mg  po qday Cont Losartan 50mg  po qday  Copd Cont Advair=> Breo 1puff qday Cont albuterol neb bid  H/o Rheumatoid arthritis HOLD methotrexate Cont Prednisone 10mg  po qday Cont Folic acid 1mg  po qday  Hypothyroidism Cont Levothyroxine 216micrograms po qday   Anxiety Xanax 0.25mg  po bid prn  Parkinsons Cont Sinemet IR 25/100mg  po tid  RLS Cont Ropinirole 0.5mg  po qhs Cont Zanaflex 4mg  po qhs  Gerd Cont PPI  Pancreatic lesion - MRI as outpatient   Family Communication/Anticipated D/C date and plan/Code Status   DVT prophylaxis: Lovenox held until after procedure-- SCDs for now Code Status: Full Code.  Family Communication:  Disposition Plan: needs thoracentesis in AM   Medical Consultants:    pulm  Subjective:   Sleeping soundly  Objective:     Vitals:   05/15/19 1959 05/16/19 0536 05/16/19 0539 05/16/19 0733  BP: 126/75 (!) 157/140    Pulse: 76 69    Resp: 18 17    Temp: 98.5 F (36.9 C) 98.3 F (36.8 C)    TempSrc: Oral Oral    SpO2: 93% 93%  94%  Weight:   98.7 kg   Height:        Intake/Output Summary (Last 24 hours) at 05/16/2019 1003 Last data filed at 05/16/2019 0820 Gross per 24 hour  Intake 920 ml  Output 975 ml  Net -55 ml   Filed Weights   05/14/19 1753 05/15/19 0035 05/16/19 0539  Weight: 99.8 kg 100.4 kg 98.7 kg    Exam: In bed, sleeping Diminished breath sounds on right rrr +BS, soft   Data Reviewed:   I have personally reviewed following labs and imaging studies:  Labs: Labs show the following:   Basic Metabolic Panel: Recent Labs  Lab 05/14/19 1810 05/16/19 0618  NA 139 138  K 4.6 3.9  CL 104 102  CO2 21* 27  GLUCOSE 142* 119*  BUN 18 18  CREATININE 1.23 1.43*  CALCIUM 9.3 9.1   GFR Estimated Creatinine Clearance: 49.2 mL/min (A) (by C-G formula based on SCr of 1.43 mg/dL (H)). Liver Function Tests: No results for input(s): AST, ALT, ALKPHOS, BILITOT, PROT, ALBUMIN in the last 168 hours. No results for input(s): LIPASE, AMYLASE in the last 168 hours. No results for input(s): AMMONIA in the last 168 hours. Coagulation profile Recent Labs  Lab 05/14/19  1810  INR 1.0    CBC: Recent Labs  Lab 05/14/19 1810 05/16/19 0618  WBC 10.1 9.3  NEUTROABS 8.1*  --   HGB 13.9 13.5  HCT 42.4 40.9  MCV 95.1 94.2  PLT 302 292   Cardiac Enzymes: Recent Labs  Lab 05/14/19 1810  TROPONINI <0.03   BNP (last 3 results) No results for input(s): PROBNP in the last 8760 hours. CBG: No results for input(s): GLUCAP in the last 168 hours. D-Dimer: No results for input(s): DDIMER in the last 72 hours. Hgb A1c: No results for input(s): HGBA1C in the last 72 hours. Lipid Profile: No results for input(s): CHOL, HDL, LDLCALC, TRIG, CHOLHDL, LDLDIRECT in the last 72 hours. Thyroid  function studies: No results for input(s): TSH, T4TOTAL, T3FREE, THYROIDAB in the last 72 hours.  Invalid input(s): FREET3 Anemia work up: No results for input(s): VITAMINB12, FOLATE, FERRITIN, TIBC, IRON, RETICCTPCT in the last 72 hours. Sepsis Labs: Recent Labs  Lab 05/14/19 1810 05/14/19 1841 05/14/19 2024 05/16/19 0618  WBC 10.1  --   --  9.3  LATICACIDVEN  --  1.4 1.3  --     Microbiology Recent Results (from the past 240 hour(s))  SARS Coronavirus 2 (CEPHEID - Performed in [place] hospital lab), Hosp Order     Status: None   Collection Time: 05/14/19  6:40 PM  Result Value Ref Range Status   SARS Coronavirus 2 NEGATIVE NEGATIVE Final    Comment: (NOTE) If result is NEGATIVE SARS-CoV-2 target nucleic acids are NOT DETECTED. The SARS-CoV-2 RNA is generally detectable in upper and lower  respiratory specimens during the acute phase of infection. The lowest  concentration of SARS-CoV-2 viral copies this assay can detect is 250  copies / mL. A negative result does not preclude SARS-CoV-2 infection  and should not be used as the sole basis for treatment or other  patient management decisions.  A negative result may occur with  improper specimen collection / handling, submission of specimen other  than nasopharyngeal swab, presence of viral mutation(s) within the  areas targeted by this assay, and inadequate number of viral copies  (<250 copies / mL). A negative result must be combined with clinical  observations, patient history, and epidemiological information. If result is POSITIVE SARS-CoV-2 target nucleic acids are DETECTED. The SARS-CoV-2 RNA is generally detectable in upper and lower  respiratory specimens dur ing the acute phase of infection.  Positive  results are indicative of active infection with SARS-CoV-2.  Clinical  correlation with patient history and other diagnostic information is  necessary to determine patient infection status.  Positive results do   not rule out bacterial infection or co-infection with other viruses. If result is PRESUMPTIVE POSTIVE SARS-CoV-2 nucleic acids MAY BE PRESENT.   A presumptive positive result was obtained on the submitted specimen  and confirmed on repeat testing.  While 2019 novel coronavirus  (SARS-CoV-2) nucleic acids may be present in the submitted sample  additional confirmatory testing may be necessary for epidemiological  and / or clinical management purposes  to differentiate between  SARS-CoV-2 and other Sarbecovirus currently known to infect humans.  If clinically indicated additional testing with an alternate test  methodology [phone]) is advised. The SARS-CoV-2 RNA is generally  detectable in upper and lower respiratory sp ecimens during the acute  phase of infection. The expected result is Negative. Fact Sheet for Patients:  StrictlyIdeas.no Fact Sheet for Healthcare Providers: BankingDealers.co.za This test is not yet approved or cleared by the  [place] Architectural technologist and has been authorized for detection and/or diagnosis of SARS-CoV-2 by FDA under an Print production planner (EUA).  This EUA will remain in effect (meaning this test can be used) for the duration of the COVID-19 declaration under Section 564(b)(1) of the Act, 21 U.S.C. section 360bbb-3(b)(1), unless the authorization is terminated or revoked sooner. Performed at [facility], [address] [place], [place] [address]     Procedures and diagnostic studies:  Ct Angio Chest Pe W/cm &/or Wo Cm  Result Date: 05/14/2019 CLINICAL DATA:  Shortness of breath EXAM: CT ANGIOGRAPHY CHEST WITH CONTRAST TECHNIQUE: Multidetector CT imaging of the chest was performed using the standard protocol during bolus administration of intravenous contrast. Multiplanar CT image reconstructions and MIPs were obtained to evaluate the vascular anatomy. CONTRAST:  50mL OMNIPAQUE IOHEXOL 350 MG/ML SOLN  COMPARISON:  Correlation made with chest x-ray from the same day FINDINGS: Cardiovascular: The patient is status post prior CABG. Aortic calcifications are noted. There is no pulmonary embolus identified. The ascending aorta is ectatic measuring up to approximately 4.2 cm in diameter. Dense coronary artery calcifications are noted. Mediastinum/Nodes: No enlarged mediastinal, hilar, or axillary lymph nodes. Thyroid gland, trachea, and esophagus demonstrate no significant findings. There are no pathologically enlarged axillary or supraclavicular lymph nodes. Lungs/Pleura: There is a large right-sided pleural effusion that is likely to some degree loculated. Multiple calcified pleural based plaques are noted primarily at the right lung base. There is some mild areas of pleural thickening superiorly. There is near complete collapse of the right lower lobe. The trachea is unremarkable. There is partial collapse of the right middle lobe. Upper Abdomen: There is cholelithiasis without CT evidence of acute cholecystitis. There is a 2 point 1 cm cystic appearing lesion near the pancreatic body (axial series 5, image 329). The visualized stomach is unremarkable. There may be a small nonobstructing stone versus vascular calcification in the interpolar region of the left kidney. Musculoskeletal: No chest wall abnormality. No acute or significant osseous findings. Review of the MIP images confirms the above findings. IMPRESSION: 1. No PE. 2. Large, likely somewhat loculated, right-sided pleural effusion with near complete collapse of the right lower lobe. 3. Multiple calcified pleural based plaques are noted on the right. There is some mild pleural thickening towards the right lung apex. Findings can be seen in patients with prior asbestos exposure. 4. Ectatic thoracic aorta measuring approximately 4.2 cm in diameter. Recommend annual imaging followup by CTA or MRA. This recommendation follows 2010  ACCF/AHA/AATS/ACR/ASA/SCA/SCAI/SIR/STS/SVM Guidelines for the Diagnosis and Management of Patients with Thoracic Aortic Disease. Circulation. 2010; 121: P233-A076. Aortic aneurysm NOS (ICD10-I71.9) 5. Cholelithiasis without CT evidence of acute cholecystitis. 6. 2.1 cm cystic pancreatic lesion. Follow-up contrast enhanced MRI is recommended 1 year. This is favored to represent a benign etiology given its appearance. Aortic Atherosclerosis (ICD10-I70.0). Electronically Signed   By: Constance Holster M.D.   On: 05/14/2019 21:26   Dg Chest Port 1 View  Result Date: 05/14/2019 CLINICAL DATA:  Shortness of breath, cough EXAM: PORTABLE CHEST 1 VIEW COMPARISON:  06/20/2014, 06/17/2018, 04/09/2017 FINDINGS: There is a small right pleural effusion. There is no left pleural effusion. There is mild right basilar atelectasis. There is no other focal parenchymal opacity. The heart mediastinum are stable. There is evidence of prior CABG. The osseous structures are unremarkable. IMPRESSION: Small right pleural effusion. Electronically Signed   By: Kathreen Devoid   On: 05/14/2019 19:08    Medications:   . aspirin EC  81 mg Oral Daily  . carbidopa-levodopa  1 tablet Oral TID  . fluticasone furoate-vilanterol  1 puff Inhalation Daily  . folic acid  1 mg Oral Daily  . levothyroxine  200 mcg Oral QHS  . losartan  50 mg Oral QHS  . pantoprazole  40 mg Oral Daily  . predniSONE  10 mg Oral Q breakfast  . rOPINIRole  0.5 mg Oral QHS  . sodium chloride flush  3 mL Intravenous Q12H  . tiZANidine  4 mg Oral QHS   Continuous Infusions: . sodium chloride       LOS: 1 day   Geradine Girt  [facility]   How to contact the [facility] Attending or Consulting provider [place] or covering provider during after hours [place], for this patient?  1. Check the care team in [facility] and look for a) attending/consulting [facility] provider listed and b) the [facility] team listed 2. Log into [email or web address] and use [place]'s universal  password to access. If you do not have the password, please contact the hospital operator. 3. Locate the [facility] provider you are looking for under [facility] and page to a number that you can be directly reached. 4. If you still have difficulty reaching the provider, please page the [facility] (Director on Call) for the Hospitalists listed on amion for assistance.  05/16/2019, 10:03 AM

## 2019-05-17 LAB — CBC
HCT: 41.6 % (ref 39.0–52.0)
Hemoglobin: 13.6 g/dL (ref 13.0–17.0)
MCH: 30.8 pg (ref 26.0–34.0)
MCHC: 32.7 g/dL (ref 30.0–36.0)
MCV: 94.3 fL (ref 80.0–100.0)
Platelets: 282 10*3/uL (ref 150–400)
RBC: 4.41 MIL/uL (ref 4.22–5.81)
RDW: 14.8 % (ref 11.5–15.5)
WBC: 10.2 10*3/uL (ref 4.0–10.5)
nRBC: 0 % (ref 0.0–0.2)

## 2019-05-17 LAB — BASIC METABOLIC PANEL
Anion gap: 9 (ref 5–15)
BUN: 19 mg/dL (ref 8–23)
CO2: 24 mmol/L (ref 22–32)
Calcium: 9.2 mg/dL (ref 8.9–10.3)
Chloride: 105 mmol/L (ref 98–111)
Creatinine, Ser: 1.22 mg/dL (ref 0.61–1.24)
GFR calc Af Amer: >60 mL/min (ref >60)
GFR calc non Af Amer: 55 mL/min — ABNORMAL LOW (ref >60)
Glucose, Bld: 101 mg/dL — ABNORMAL HIGH (ref 70–99)
Potassium: 3.8 mmol/L (ref 3.5–5.1)
Sodium: 138 mmol/L (ref 135–145)

## 2019-05-17 LAB — ACID FAST SMEAR (AFB, MYCOBACTERIA): Acid Fast Smear: NEGATIVE

## 2019-05-17 NOTE — Progress Notes (Signed)
Pleural fluid appears to be a lymphocytic exudate with low glucose Differential includes rheumatoid effusion, but await fluid cytology  Follow-up chest x-ray shows decrease effusion improved aeration right lower lobe  Rakesh V. Elsworth Soho MD

## 2019-05-17 NOTE — Progress Notes (Addendum)
Progress Note    Jeffrey [name]  EYC:[id number] DOB: 1934-04-18  DOA: 05/14/2019 PCP: [name], MD    Brief Narrative:   Medical records reviewed and are as summarized below:  Jeffrey [name] is an 85 y.o. male w CAD s/p CABG, ischemic CM,  mild Aortic stenosis, Asthma (mild), OSA, Hypothyroidism,  Parkinsons, Anxiety, remote hx of MRSE septic arthritis left knee s/p L knee ID/ synovectomy, L THA, Anemia/ ITP apparently presents with c/o cough with white->slight yellow sputum since January of this year.  Pt notes dyspnea for the past 4 weeks. Worse over the past 3-4 day  Assessment/Plan:   Principal Problem:   Pleural effusion on right Active Problems:   CAD (coronary artery disease) of artery bypass graft   Hypothyroidism   PD (Parkinson's disease) (HCC)   Pancreatic lesion  Right pleural effusion Sputum culture Appreciate Pulmonary consult-- s/p for thoracentesis: concern for mesothelioma (if cytology negative the plan is to repeat thoro?)- has improved aeration in upper right lung -vanc and zosyn given x 1 in ER -echo: EF 55%, some impaired relaxation  CAD s/p CABG, DES Lcx, DES LAD Cont Aspirin 81mg  po qday Cont Losartan 50mg  po qday  Copd Cont Advair=> Breo 1puff qday Cont albuterol neb bid  H/o Rheumatoid arthritis HOLD methotrexate Cont Prednisone 10mg  po qday Cont Folic acid 1mg  po qday  Hypothyroidism Cont Levothyroxine 252micrograms po qday  Anxiety Xanax 0.25mg  po bid prn  Parkinson Cont Sinemet IR 25/100mg  po tid  RLS Cont Ropinirole 0.5mg  po qhs Cont Zanaflex 4mg  po qhs  Gerd Cont PPI  Pancreatic lesion - MRI as outpatient   Family Communication/Anticipated D/C date and plan/Code Status   DVT prophylaxis: Lovenox held until after procedures-- SCDs for now Code Status: Full Code.  Family Communication: updated wife Disposition Plan: plan per pulmonology   Medical Consultants:    pulm  Subjective:   Feels  breathing is only minimally improved  Objective:    Vitals:   05/16/19 1938 05/17/19 0452 05/17/19 0808 05/17/19 0852  BP: 115/76 (!) 102/58  106/76  Pulse: 70 60  72  Resp: 18 18  18   Temp: 97.7 F (36.5 C) 97.9 F (36.6 C)    TempSrc: Oral Oral    SpO2: 96% 91% 90% 96%  Weight:  98.1 kg    Height:        Intake/Output Summary (Last 24 hours) at 05/17/2019 1017 Last data filed at 05/17/2019 0857 Gross per 24 hour  Intake 1692.53 ml  Output 850 ml  Net 842.53 ml   Filed Weights   05/15/19 0035 05/16/19 0539 05/17/19 0452  Weight: 100.4 kg 98.7 kg 98.1 kg    Exam: In bed,laying on left side Improved breath sounds in right upper lobe, diminished in base rrr A+Ox3   Data Reviewed:   I have personally reviewed following labs and imaging studies:  Labs: Labs show the following:   Basic Metabolic Panel: Recent Labs  Lab 05/14/19 1810 05/16/19 0618 05/17/19 0459  NA 139 138 138  K 4.6 3.9 3.8  CL 104 102 105  CO2 21* 27 24  GLUCOSE 142* 119* 101*  BUN 18 18 19   CREATININE 1.23 1.43* 1.22  CALCIUM 9.3 9.1 9.2   GFR Estimated Creatinine Clearance: 57.6 mL/min (by C-G formula based on SCr of 1.22 mg/dL). Liver Function Tests: Recent Labs  Lab 05/16/19 1217  PROT 6.2*   No results for input(s): LIPASE, AMYLASE in the last 168 hours. No  results for input(s): AMMONIA in the last 168 hours. Coagulation profile Recent Labs  Lab 05/14/19 1810  INR 1.0    CBC: Recent Labs  Lab 05/14/19 1810 05/16/19 0618 05/17/19 0459  WBC 10.1 9.3 10.2  NEUTROABS 8.1*  --   --   HGB 13.9 13.5 13.6  HCT 42.4 40.9 41.6  MCV 95.1 94.2 94.3  PLT 302 292 282   Cardiac Enzymes: Recent Labs  Lab 05/14/19 1810  TROPONINI <0.03   BNP (last 3 results) No results for input(s): PROBNP in the last 8760 hours. CBG: No results for input(s): GLUCAP in the last 168 hours. D-Dimer: No results for input(s): DDIMER in the last 72 hours. Hgb A1c: No results for  input(s): HGBA1C in the last 72 hours. Lipid Profile: No results for input(s): CHOL, HDL, LDLCALC, TRIG, CHOLHDL, LDLDIRECT in the last 72 hours. Thyroid function studies: No results for input(s): TSH, T4TOTAL, T3FREE, THYROIDAB in the last 72 hours.  Invalid input(s): FREET3 Anemia work up: No results for input(s): VITAMINB12, FOLATE, FERRITIN, TIBC, IRON, RETICCTPCT in the last 72 hours. Sepsis Labs: Recent Labs  Lab 05/14/19 1810 05/14/19 1841 05/14/19 2024 05/16/19 0618 05/17/19 0459  WBC 10.1  --   --  9.3 10.2  LATICACIDVEN  --  1.4 1.3  --   --     Microbiology Recent Results (from the past 240 hour(s))  SARS Coronavirus 2 (CEPHEID - Performed in [place] hospital lab), Hosp Order     Status: None   Collection Time: 05/14/19  6:40 PM  Result Value Ref Range Status   SARS Coronavirus 2 NEGATIVE NEGATIVE Final    Comment: (NOTE) If result is NEGATIVE SARS-CoV-2 target nucleic acids are NOT DETECTED. The SARS-CoV-2 RNA is generally detectable in upper and lower  respiratory specimens during the acute phase of infection. The lowest  concentration of SARS-CoV-2 viral copies this assay can detect is 250  copies / mL. A negative result does not preclude SARS-CoV-2 infection  and should not be used as the sole basis for treatment or other  patient management decisions.  A negative result may occur with  improper specimen collection / handling, submission of specimen other  than nasopharyngeal swab, presence of viral mutation(s) within the  areas targeted by this assay, and inadequate number of viral copies  (<250 copies / mL). A negative result must be combined with clinical  observations, patient history, and epidemiological information. If result is POSITIVE SARS-CoV-2 target nucleic acids are DETECTED. The SARS-CoV-2 RNA is generally detectable in upper and lower  respiratory specimens dur ing the acute phase of infection.  Positive  results are indicative of active  infection with SARS-CoV-2.  Clinical  correlation with patient history and other diagnostic information is  necessary to determine patient infection status.  Positive results do  not rule out bacterial infection or co-infection with other viruses. If result is PRESUMPTIVE POSTIVE SARS-CoV-2 nucleic acids MAY BE PRESENT.   A presumptive positive result was obtained on the submitted specimen  and confirmed on repeat testing.  While 2019 novel coronavirus  (SARS-CoV-2) nucleic acids may be present in the submitted sample  additional confirmatory testing may be necessary for epidemiological  and / or clinical management purposes  to differentiate between  SARS-CoV-2 and other Sarbecovirus currently known to infect humans.  If clinically indicated additional testing with an alternate test  methodology [phone]) is advised. The SARS-CoV-2 RNA is generally  detectable in upper and lower respiratory sp ecimens during the  acute  phase of infection. The expected result is Negative. Fact Sheet for Patients:  StrictlyIdeas.no Fact Sheet for Healthcare Providers: BankingDealers.co.za This test is not yet approved or cleared by the [place] FDA and has been authorized for detection and/or diagnosis of SARS-CoV-2 by FDA under an Emergency Use Authorization (EUA).  This EUA will remain in effect (meaning this test can be used) for the duration of the COVID-19 declaration under Section 564(b)(1) of the Act, 21 U.S.C. section 360bbb-3(b)(1), unless the authorization is terminated or revoked sooner. Performed at [facility], [address], [place], [place] [address]   Body fluid culture     Status: None (Preliminary result)   Collection Time: 05/16/19 12:48 PM  Result Value Ref Range Status   Specimen Description PLEURAL  Final   Special Requests NONE  Final   Gram Stain   Final    MODERATE WBC PRESENT,BOTH PMN AND MONONUCLEAR NO ORGANISMS SEEN     Culture   Final    NO GROWTH < 24 HOURS Performed at [facility], [place] [address], [place], [place] [address]    Report Status PENDING  Incomplete    Procedures and diagnostic studies:  Dg Chest Port 1 View  Result Date: 05/16/2019 CLINICAL DATA:  Status post RIGHT-sided thoracentesis. Chest pain during inspiration. EXAM: PORTABLE CHEST 1 VIEW COMPARISON:  Chest x-ray dated 05/14/2019. Chest CT dated 05/14/2019. FINDINGS: Slightly decreased opacity at the RIGHT lung base, compatible with residual pleural effusion and/or airspace collapse. LEFT lung remains clear. No pneumothorax seen. Heart size and mediastinal contours are stable. IMPRESSION: 1. Status post RIGHT-sided thoracentesis, per provided clinical data. Slightly improved aeration at the RIGHT lung base. Residual opacity at the RIGHT lung base likely represents smaller pleural effusion and/or airspace collapse. 2. No pneumothorax seen. Electronically Signed   By: Franki Cabot M.D.   On: 05/16/2019 12:55    Medications:   . aspirin EC  81 mg Oral Daily  . carbidopa-levodopa  1 tablet Oral TID  . fluticasone furoate-vilanterol  1 puff Inhalation Daily  . folic acid  1 mg Oral Daily  . levothyroxine  200 mcg Oral QHS  . losartan  50 mg Oral QHS  . pantoprazole  40 mg Oral Daily  . predniSONE  10 mg Oral Q breakfast  . rOPINIRole  0.5 mg Oral QHS  . sodium chloride flush  3 mL Intravenous Q12H  . tiZANidine  4 mg Oral QHS   Continuous Infusions: . sodium chloride    . sodium chloride 50 mL/hr at 05/16/19 1700     LOS: 2 days   [facility]   How to contact the [facility] Attending or Consulting provider [place] or covering provider during after hours [place], for this patient?  1. Check the care team in [facility] and look for a) attending/consulting [facility] provider listed and b) the [facility] team listed 2. Log into [email or web address] and use [facility]'s universal password to access. If you do not have the password,  please contact the hospital operator. 3. Locate the [facility] provider you are looking for under [facility] and page to a number that you can be directly reached. 4. If you still have difficulty reaching the provider, please page the [facility] (Director on Call) for the Hospitalists listed on amion for assistance.  05/17/2019, 10:17 AM

## 2019-05-18 DIAGNOSIS — I2510 Atherosclerotic heart disease of native coronary artery without angina pectoris: Secondary | ICD-10-CM

## 2019-05-18 DIAGNOSIS — I255 Ischemic cardiomyopathy: Secondary | ICD-10-CM

## 2019-05-18 DIAGNOSIS — J90 Pleural effusion, not elsewhere classified: Secondary | ICD-10-CM

## 2019-05-18 LAB — SURGICAL PCR SCREEN
MRSA, PCR: NEGATIVE
Staphylococcus aureus: NEGATIVE

## 2019-05-18 LAB — RHEUMATOID FACTORS, FLUID: Rheumatoid Arthritis, Qn/Fluid: NEGATIVE

## 2019-05-18 LAB — EXPECTORATED SPUTUM ASSESSMENT W GRAM STAIN, RFLX TO RESP C: Special Requests: NORMAL

## 2019-05-18 MED ORDER — POLYETHYLENE GLYCOL 3350 17 G PO PACK
17.0000 g | PACK | Freq: Every day | ORAL | Status: DC | PRN
  Administered 2019-05-18 – 2019-05-19 (×2): 17 g via ORAL
  Filled 2019-05-18 (×2): qty 1

## 2019-05-18 NOTE — Progress Notes (Signed)
Jeffrey Kitchen  PROGRESS NOTE    [name]  IDP:[id number] DOB: [birth year] DOA: 05/14/2019 PCP: [name], MD   Brief Narrative:   Jeffrey [name] is an 85 y.o. male w CAD s/p CABG, ischemic CM, mild Aortic stenosis, Asthma (mild), OSA, Hypothyroidism, Parkinsons, Anxiety, remote hx of MRSE septic arthritis left knee s/p L knee ID/ synovectomy, L THA, Anemia/ ITP apparently presents with c/o cough with white->slight yellow sputum since January of this year. Pt notes dyspnea for the past 4 weeks. Worse over the past 3-4 day   Assessment & Plan:   Principal Problem:   Pleural effusion on right Active Problems:   CAD (coronary artery disease) of artery bypass graft   Hypothyroidism   PD (Parkinson's disease) (HCC)   Pancreatic lesion   Right pleural effusion     - Sputum culture     - Appreciate Pulmonary consult: s/p thoracentesis: concern for mesothelioma (if cytology negative the plan is to repeat thoro?)     - has improved aeration in upper right lung     - vanc and zosyn given x 1 in ER     - echo: EF 55%, some impaired relaxation     - pulm to consult CTS  CAD s/p CABG, DES Lcx, DES LAD     - Cont Aspirin 81mg  po qday     - Cont Losartan 50mg  po qday; a little on hypotensive side this AM, place hold parameters  Copd w/o exacerbation     - Cont Breo 1puff qday and albuterol neb bid  H/o Rheumatoid arthritis     - HOLD methotrexate     - Cont Prednisone 10mg  po qday and Folic acid 1mg  po qday  Hypothyroidism     - Cont Levothyroxine 297micrograms po qday  Anxiety     - Xanax 0.25mg  po bid prn  Parkinson     - Cont Sinemet IR 25/100mg  po tid  RLS     - Cont Ropinirole 0.5mg  po qhs and Zanaflex 4mg  po qhs  Gerd     - Cont Protonix  Pancreatic lesion     - MRI as outpatient  Constipation     - PRN miralax   DVT prophylaxis: SCD Code Status: FULL   Disposition Plan: TBD   Consultants:   Pulmonology  Procedures:   Thoracentesis    Subjective: "I'm starting to feel like I did before."  Objective: Vitals:   05/17/19 1950 05/18/19 0529 05/18/19 0849 05/18/19 1136  BP: 137/69 (!) 94/58 (!) 89/60 115/65  Pulse: 71 66 78 70  Resp: 18 18  17   Temp: 98.1 F (36.7 C) 98.1 F (36.7 C)  98.2 F (36.8 C)  TempSrc: Oral Oral  Oral  SpO2: 97% 97% 96% 92%  Weight:  98.1 kg    Height:        Intake/Output Summary (Last 24 hours) at 05/18/2019 1226 Last data filed at 05/18/2019 0905 Gross per 24 hour  Intake 460 ml  Output 1700 ml  Net -1240 ml   Filed Weights   05/16/19 0539 05/17/19 0452 05/18/19 0529  Weight: 98.7 kg 98.1 kg 98.1 kg    Examination:  General exam: 85 y.o. male Appears calm and comfortable  Respiratory system: decreased right base, but clear otherwise Cardiovascular system: S1 & S2 heard, RRR. No JVD, murmurs, rubs, gallops or clicks. No pedal edema. Gastrointestinal system: Abdomen is nondistended, soft and nontender. No organomegaly or masses felt. Normal bowel sounds heard. Central nervous system:  Alert and oriented. No focal neurological deficits. Skin: No rashes, lesions or ulcers    Data Reviewed: I have personally reviewed following labs and imaging studies.  CBC: Recent Labs  Lab 05/14/19 1810 05/16/19 0618 05/17/19 0459  WBC 10.1 9.3 10.2  NEUTROABS 8.1*  --   --   HGB 13.9 13.5 13.6  HCT 42.4 40.9 41.6  MCV 95.1 94.2 94.3  PLT 302 292 101   Basic Metabolic Panel: Recent Labs  Lab 05/14/19 1810 05/16/19 0618 05/17/19 0459  NA 139 138 138  K 4.6 3.9 3.8  CL 104 102 105  CO2 21* 27 24  GLUCOSE 142* 119* 101*  BUN 18 18 19   CREATININE 1.23 1.43* 1.22  CALCIUM 9.3 9.1 9.2   GFR: Estimated Creatinine Clearance: 57.6 mL/min (by C-G formula based on SCr of 1.22 mg/dL). Liver Function Tests: Recent Labs  Lab 05/16/19 1217  PROT 6.2*   No results for input(s): LIPASE, AMYLASE in the last 168 hours. No results for input(s): AMMONIA in the last 168 hours.  Coagulation Profile: Recent Labs  Lab 05/14/19 1810  INR 1.0   Cardiac Enzymes: Recent Labs  Lab 05/14/19 1810  TROPONINI <0.03   BNP (last 3 results) No results for input(s): PROBNP in the last 8760 hours. HbA1C: No results for input(s): HGBA1C in the last 72 hours. CBG: No results for input(s): GLUCAP in the last 168 hours. Lipid Profile: No results for input(s): CHOL, HDL, LDLCALC, TRIG, CHOLHDL, LDLDIRECT in the last 72 hours. Thyroid Function Tests: No results for input(s): TSH, T4TOTAL, FREET4, T3FREE, THYROIDAB in the last 72 hours. Anemia Panel: No results for input(s): VITAMINB12, FOLATE, FERRITIN, TIBC, IRON, RETICCTPCT in the last 72 hours. Sepsis Labs: Recent Labs  Lab 05/14/19 1841 05/14/19 2024  LATICACIDVEN 1.4 1.3    Recent Results (from the past 240 hour(s))  SARS Coronavirus 2 (CEPHEID - Performed in [facility] hospital lab), Hosp Order     Status: None   Collection Time: 05/14/19  6:40 PM  Result Value Ref Range Status   SARS Coronavirus 2 NEGATIVE NEGATIVE Final    Comment: (NOTE) If result is NEGATIVE SARS-CoV-2 target nucleic acids are NOT DETECTED. The SARS-CoV-2 RNA is generally detectable in upper and lower  respiratory specimens during the acute phase of infection. The lowest  concentration of SARS-CoV-2 viral copies this assay can detect is 250  copies / mL. A negative result does not preclude SARS-CoV-2 infection  and should not be used as the sole basis for treatment or other  patient management decisions.  A negative result may occur with  improper specimen collection / handling, submission of specimen other  than nasopharyngeal swab, presence of viral mutation(s) within the  areas targeted by this assay, and inadequate number of viral copies  (<250 copies / mL). A negative result must be combined with clinical  observations, patient history, and epidemiological information. If result is POSITIVE SARS-CoV-2 target nucleic acids are  DETECTED. The SARS-CoV-2 RNA is generally detectable in upper and lower  respiratory specimens dur ing the acute phase of infection.  Positive  results are indicative of active infection with SARS-CoV-2.  Clinical  correlation with patient history and other diagnostic information is  necessary to determine patient infection status.  Positive results do  not rule out bacterial infection or co-infection with other viruses. If result is PRESUMPTIVE POSTIVE SARS-CoV-2 nucleic acids MAY BE PRESENT.   A presumptive positive result was obtained on the submitted specimen  and confirmed  on repeat testing.  While 2019 novel coronavirus  (SARS-CoV-2) nucleic acids may be present in the submitted sample  additional confirmatory testing may be necessary for epidemiological  and / or clinical management purposes  to differentiate between  SARS-CoV-2 and other Sarbecovirus currently known to infect humans.  If clinically indicated additional testing with an alternate test  methodology [phone]) is advised. The SARS-CoV-2 RNA is generally  detectable in upper and lower respiratory sp ecimens during the acute  phase of infection. The expected result is Negative. Fact Sheet for Patients:  StrictlyIdeas.no Fact Sheet for Healthcare Providers: BankingDealers.co.za This test is not yet approved or cleared by the [place] FDA and has been authorized for detection and/or diagnosis of SARS-CoV-2 by FDA under an Emergency Use Authorization (EUA).  This EUA will remain in effect (meaning this test can be used) for the duration of the COVID-19 declaration under Section 564(b)(1) of the Act, 21 U.S.C. section 360bbb-3(b)(1), unless the authorization is terminated or revoked sooner. Performed at [facility], [address], [place], [place] [address]   Body fluid culture     Status: None (Preliminary result)   Collection Time: 05/16/19 12:48 PM  Result Value  Ref Range Status   Specimen Description PLEURAL  Final   Special Requests NONE  Final   Gram Stain   Final    MODERATE WBC PRESENT,BOTH PMN AND MONONUCLEAR NO ORGANISMS SEEN    Culture   Final    NO GROWTH 2 DAYS Performed at [facility], [place] [address], [place], [place] [address]    Report Status PENDING  Incomplete  Acid Fast Smear (AFB)     Status: None   Collection Time: 05/16/19 12:48 PM  Result Value Ref Range Status   AFB Specimen Processing Concentration  Final   Acid Fast Smear Negative  Final    Comment: (NOTE) Performed At: [facility] [place], [place] [id number] Rush Farmer MD HU:[id number]    Source (AFB) FLUID  Final    Comment: PLEURAL Performed at [facility], [place] [address], [place], [place] [address]          Radiology Studies: Dg Chest [facility] 1 View  Result Date: 05/16/2019 CLINICAL DATA:  Status post RIGHT-sided thoracentesis. Chest pain during inspiration. EXAM: PORTABLE CHEST 1 VIEW COMPARISON:  Chest x-ray dated 05/14/2019. Chest CT dated 05/14/2019. FINDINGS: Slightly decreased opacity at the RIGHT lung base, compatible with residual pleural effusion and/or airspace collapse. LEFT lung remains clear. No pneumothorax seen. Heart size and mediastinal contours are stable. IMPRESSION: 1. Status post RIGHT-sided thoracentesis, per provided clinical data. Slightly improved aeration at the RIGHT lung base. Residual opacity at the RIGHT lung base likely represents smaller pleural effusion and/or airspace collapse. 2. No pneumothorax seen. Electronically Signed   By: Franki Cabot M.D.   On: 05/16/2019 12:55        Scheduled Meds: . aspirin EC  81 mg Oral Daily  . carbidopa-levodopa  1 tablet Oral TID  . fluticasone furoate-vilanterol  1 puff Inhalation Daily  . folic acid  1 mg Oral Daily  . levothyroxine  200 mcg Oral QHS  . losartan  50 mg Oral QHS  . pantoprazole  40 mg Oral Daily  . predniSONE  10 mg Oral Q  breakfast  . rOPINIRole  0.5 mg Oral QHS  . sodium chloride flush  3 mL Intravenous Q12H  . tiZANidine  4 mg Oral QHS   Continuous Infusions: . sodium  chloride    . sodium chloride 50 mL/hr at 05/16/19 1700     LOS: 3 days    Time spent: 25 minutes spent in the coordination of care today.    Jonnie Finner, DO [facility] Pager [phone]  If 7PM-7AM, please contact night-coverage [email or web address] Password [facility] 05/18/2019, 12:26 PM

## 2019-05-18 NOTE — Care Management Important Message (Signed)
Important Message  Patient Details  Name: Jeffrey [name] MRN: [id number] Date of Birth: 11-26-1934   Medicare Important Message Given:  Yes    Orbie Pyo 05/18/2019, 2:27 PM

## 2019-05-18 NOTE — Progress Notes (Signed)
Provided sputum container for sputum collection, educated patient about proper use.

## 2019-05-18 NOTE — Progress Notes (Signed)
Received call from grandson stating that MD pulmology wanted to talk to him and patients wife together. Yolanda Bonine stated he is together with his grandmother now. Paged MD Montel Culver and inform him

## 2019-05-18 NOTE — Evaluation (Signed)
Physical Therapy Evaluation Patient Details Name: Jeffrey [name] MRN: [id number] DOB: 1934/10/19 Today's Date: 05/18/2019   History of Present Illness  Patient is a 85 y/o male presenting to the ED on 05/14/19 with primary complaints of dyspnea. Past medical history of CAD s/p CABG, ischemic CM,  mild Aortic stenosis, Asthma (mild), OSA, Hypothyroidism,  Parkinsons, Anxiety, remote hx of MRSE septic arthritis left knee s/p L knee ID/ synovectomy, L THA, Anemia/ ITP. Ct revealing Large, likely somewhat loculated, right-sided pleural effusion with near complete collapse of the right lower lobe. Admitted for R pleural effusion.     Clinical Impression  Patient admitted with the above listed diagnosis. Patient reports Mod I with mobility and ADLs prior to admission. Patient emotional upon PT arrival, but agreeable to PT session. Performing transfers and ambulating without AD - use of IV pole in hallway - no LOB, however patient does demonstrate flexed posture with gait and some shuffling with cueing for safety throughout. PT to follow acutely to maximize safe and independent functional mobility. Currently do not anticipate need for PT follow-up at discharge.     Follow Up Recommendations No PT follow up    Equipment Recommendations  None recommended by PT    Recommendations for Other Services       Precautions / Restrictions Precautions Precautions: Fall Restrictions Weight Bearing Restrictions: No      Mobility  Bed Mobility               General bed mobility comments: up in recliner  Transfers Overall transfer level: Needs assistance Equipment used: None Transfers: Sit to/from Stand Sit to Stand: Min guard         General transfer comment: for safety and initial standing balance  Ambulation/Gait Ambulation/Gait assistance: Min guard Gait Distance (Feet): 400 Feet Assistive device: IV Pole Gait Pattern/deviations: Step-through pattern;Decreased stride  length;Shuffle;Trunk flexed Gait velocity: decreased   General Gait Details: flexed trunk throughout; mild instability -  patient reports this at baseline  Stairs            Wheelchair Mobility    Modified Rankin (Stroke Patients Only)       Balance Overall balance assessment: Mild deficits observed, not formally tested                                           Pertinent Vitals/Pain Pain Assessment: No/denies pain    Home Living Family/patient expects to be discharged to:: Private residence Living Arrangements: Spouse/significant other Available Help at Discharge: Family;Available 24 hours/day Type of Home: House Home Access: Stairs to enter Entrance Stairs-Rails: Left Entrance Stairs-Number of Steps: 4 Home Layout: Two level;Able to live on main level with bedroom/bathroom Home Equipment: Gilford Rile - 2 wheels;Cane - single point;Bedside commode      Prior Function Level of Independence: Independent         Comments: reports he does not use AD for mobility     Hand Dominance   Dominant Hand: Right    Extremity/Trunk Assessment        Lower Extremity Assessment Lower Extremity Assessment: Generalized weakness    Cervical / Trunk Assessment Cervical / Trunk Assessment: Kyphotic  Communication   Communication: No difficulties  Cognition Arousal/Alertness: Awake/alert Behavior During Therapy: WFL for tasks assessed/performed Overall Cognitive Status: Within Functional Limits for tasks assessed  General Comments General comments (skin integrity, edema, etc.): emotional regarding possbile diagnosis; SpO2 on RA 98% with mobility    Exercises     Assessment/Plan    PT Assessment Patient needs continued PT services  PT Problem List Decreased strength;Decreased activity tolerance;Decreased balance;Decreased mobility;Decreased knowledge of use of DME;Decreased safety awareness        PT Treatment Interventions DME instruction;Gait training;Stair training;Therapeutic activities;Functional mobility training;Therapeutic exercise;Balance training;Patient/family education    PT Goals (Current goals can be found in the Care Plan section)  Acute Rehab PT Goals Patient Stated Goal: "I want to stay active" PT Goal Formulation: With patient Time For Goal Achievement: 06/01/19 Potential to Achieve Goals: Good    Frequency Min 3X/week   Barriers to discharge        Co-evaluation               AM-PAC PT "6 Clicks" Mobility  Outcome Measure Help needed turning from your back to your side while in a flat bed without using bedrails?: None Help needed moving from lying on your back to sitting on the side of a flat bed without using bedrails?: None Help needed moving to and from a bed to a chair (including a wheelchair)?: A Little Help needed standing up from a chair using your arms (e.g., wheelchair or bedside chair)?: A Little Help needed to walk in hospital room?: A Little Help needed climbing 3-5 steps with a railing? : A Little 6 Click Score: 20    End of Session Equipment Utilized During Treatment: Gait belt Activity Tolerance: Patient tolerated treatment well Patient left: in chair;with call bell/phone within reach Nurse Communication: Mobility status PT Visit Diagnosis: Unsteadiness on feet (R26.81);Other abnormalities of gait and mobility (R26.89);Muscle weakness (generalized) (M62.81)    Time: 1440-1500 PT Time Calculation (min) (ACUTE ONLY): 20 min   Charges:   PT Evaluation $PT Eval Moderate Complexity: 1 Mod          Lanney Gins, PT, DPT Supplemental Physical Therapist 05/18/19 3:09 PM Pager: [phone] Office: [phone]

## 2019-05-18 NOTE — Consult Note (Addendum)
[address]       [place] [address]             [phone]        Alvy W Kretschmer [place] Medical Record #[id number] Date of Birth: 1934-10-04  Referring: Dr. 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 He then presented to [facility] ED with complaints of worsening shortness of breath, cough, and unintentional 10 pound weight loss over the last month. He used to work for [employer] and has known asbestos exposure, as well as a remote history of tobacco abuse. CXR showed small right pleural effusion. CT angio of the chest showed NO PE, large loculated right pleural effusion with near collapse of the RLL, multiple calcified right pleural based plaques, ectatic thoracic aorta measuring approximately 4.2 cm in diameter, cholelithiasis, and 2.1 cm cystic pancreatic lesion. Pulmonary/CCM consult was obtained. Patient underwent a right thoracentesis with removal of 1000 ml bloody fluid on 05/25. Pathology showed reactive mesothelial cells and chronic inflammation. [name] has been consulted for consideration of a right VATS and pleural biopsies, as mesothelioma is suspected.  Current Activity/ Functional Status: Patient is independent with mobility/ambulation, transfers, ADL's, IADL's.   Past Medical History:  Diagnosis Date  . Anxiety   . Aortic stenosis    mild AS by 07/20/13 echo ([facility])  . Arthritis    "all  over"  . Coronary artery disease    NSTEMI 06/2010, CABG x3=> [place]->LAD, SVG->OM1, SVG->PDA, DES LCx 10/2011, DES LAD 12/2011  . Depression   . GERD (gastroesophageal reflux disease)   . H/O hiatal hernia   . Hearing aid worn    B/L  . High cholesterol   . History of blood transfusion reaction 03/07/2013   "he almost died; he has to get irradiated blood next time"  . Hypertension   . Hypothyroidism   . Ischemic cardiomyopathy   . ITP (idiopathic thrombocytopenic purpura)    Dr. Gaynelle Arabian, on [place]  . Myocardial infarction ([place]) 03-07-2010  . Pneumonia 1940's X 1; 03-07-2014 X 1  . Rheumatoid arthritis ([place])   . Shortness of breath    with exertion, has not been very active  . Sleep apnea   . Wears glasses   . Wears partial dentures     Past Surgical History:  Procedure Laterality Date  . APPENDECTOMY    . BACK SURGERY    . CATARACT EXTRACTION, BILATERAL    . COLONOSCOPY    . CORONARY ANGIOPLASTY WITH STENT PLACEMENT     DES Lcx 10/2011, DES LAD 12/2011  . CORONARY ARTERY BYPASS GRAFT  03-07-2010   "CABG X3"  . GASTROC RECESSION EXTREMITY Right 07/06/2015   Pasty Spillers (orthopedics- [place], [place])  . HERNIA REPAIR    . HIATAL HERNIA REPAIR    . KNEE ARTHROSCOPY Left 06/22/2014   w/I&D  . KNEE ARTHROSCOPY Left 06/22/2014   Procedure: IRRIGATION AND DEBRIDEMENT  WITH CHRONDROPLASTY;  Surgeon: Hessie Dibble, MD;  Location: [facility];  Service: Orthopedics;  Laterality: Left;  . LUMBAR DISC SURGERY  1960's?  . MULTIPLE TOOTH EXTRACTIONS    . SHOULDER ARTHROSCOPY Right 04/09/2017   Procedure: ARTHROSCOPY SHOULDER;  Surgeon: Melrose Nakayama, MD;  Location: [facility];  Service: Orthopedics;  Laterality: Right;  . STERNAL CLOSURE     "wires from OHS taken out; plate put in" (02/24/7016)  . SYNOVECTOMY Left 08/17/2014   Procedure: SYNOVECTOMY LEFT KNEE;  Surgeon: Hessie Dibble, MD;  Location: [facility];  Service: Orthopedics;  Laterality: Left;  . TOTAL ANKLE ARTHROPLASTY Right 07/06/2015   Pasty Spillers (orthopedics-  [facility])  . TOTAL HIP ARTHROPLASTY Right 06/29/2018   Procedure: RIGHT TOTAL HIP ARTHROPLASTY ANTERIOR APPROACH;  Surgeon: Melrose Nakayama, MD;  Location: [facility];  Service: Orthopedics;  Laterality: Right;  . TOTAL HIP ARTHROPLASTY Left 06/09/2016   Scott Streater Claiborne Billings (orthopedics- [place])  . TOTAL KNEE ARTHROPLASTY Left 11/24/2014   Procedure: TOTAL KNEE ARTHROPLASTY;  Surgeon: Hessie Dibble, MD;  Location: [facility];  Service: Orthopedics;  Laterality: Left;    Social History   Tobacco Use  Smoking Status Former Smoker  . Packs/day: 1.00  . Years: 15.00  . Pack years: 15.00  . Types: Cigarettes  . Last attempt to quit: 12/22/1966  . Years since quitting: 52.4  Smokeless Tobacco Former Systems developer  . Types: Chew  Tobacco Comment   "quit smoking ~ late ~ 60's; quit chewing in the 1970's"    Social History   Substance and Sexual Activity  Alcohol Use No     Allergies  Allergen Reactions  . Lipitor [Atorvastatin] Other (See Comments)    muscle spasms cramps  . Methylprednisolone Other (See Comments)    cramps cramps  . Other     If patient is to receive blood - blood must be treated witgh radiation because his body will not accept a normal infusion   . Sulfa Antibiotics Itching  . Doxycycline Rash    Doxycycline caused itching redness on scalp and head Doxycycline caused itching redness on scalp and head    Current Facility-Administered Medications  Medication Dose Route Frequency Provider Last Rate Last Dose  . 0.9 %  sodium chloride infusion  250 mL Intravenous PRN Jani Gravel, MD      . 0.9 %  sodium chloride infusion   Intravenous Continuous Eulogio Bear U, DO 50 mL/hr at 05/18/19 1258    . acetaminophen (TYLENOL) tablet 650 mg  650 mg Oral Q6H PRN Jani Gravel, MD   650 mg at 05/16/19 2105   Or  . acetaminophen (TYLENOL) suppository 650 mg  650 mg Rectal Q6H PRN Jani Gravel, MD      . ALPRAZolam Duanne Moron) tablet 0.25 mg  0.25 mg Oral BID PRN Jani Gravel, MD   0.25 mg at  05/17/19 1412  . aspirin EC tablet 81 mg  81 mg Oral Daily Jani Gravel, MD   81 mg at 05/18/19 0859  . carbidopa-levodopa (SINEMET IR) 25-100 MG per tablet immediate release 1 tablet  1 tablet Oral TID Jani Gravel, MD   1 tablet at 05/18/19 0859  . fluticasone furoate-vilanterol (BREO ELLIPTA) 100-25 MCG/INH 1 puff  1 puff Inhalation Daily Jani Gravel, MD   1 puff at 05/18/19 0848  . folic acid (FOLVITE) tablet 1 mg  1 mg Oral Daily Jani Gravel, MD   1 mg at 05/18/19 0859  . hydrALAZINE (APRESOLINE) injection 5 mg  5 mg Intravenous Q6H PRN Jani Gravel, MD      . levothyroxine (SYNTHROID) tablet 200 mcg  200 mcg Oral Loma Sousa, MD   200 mcg at 05/17/19 2318  . losartan (COZAAR) tablet 50 mg  50 mg Oral QHS Jani Gravel, MD   50 mg at 05/17/19 2319  . pantoprazole (PROTONIX) EC tablet 40 mg  40 mg Oral Daily Jani Gravel, MD   40 mg at 05/18/19 0859  . polyethylene glycol (MIRALAX / GLYCOLAX) packet 17 g  17 g Oral Daily PRN Marylyn Ishihara, Tyrone A, DO      . predniSONE (DELTASONE) tablet 10 mg  10 mg Oral Q breakfast Jani Gravel, MD   10 mg at 05/18/19 0858  . rOPINIRole (REQUIP) tablet 0.5 mg  0.5 mg Oral QHS Jani Gravel, MD   0.5 mg at 05/17/19 2319  . sodium chloride flush (NS) 0.9 % injection 3 mL  3 mL Intravenous Q12H Jani Gravel, MD   3 mL at 05/17/19 2320  . sodium chloride flush (NS) 0.9 % injection 3 mL  3 mL Intravenous PRN Jani Gravel, MD      . tiZANidine (ZANAFLEX) tablet 4 mg  4 mg Oral QHS Jani Gravel, MD   4 mg at 05/17/19 2319    Medications Prior to Admission  Medication Sig Dispense Refill Last Dose  . ALPRAZolam (XANAX) 0.25 MG tablet Take 1 tablet (0.25 mg total) by mouth 2 (two) times daily as needed for anxiety. 20 tablet 0 05/14/2019 at Unknown time  . aspirin EC 81 MG tablet Take 81 mg by mouth daily.   05/14/2019 at Unknown time  . azithromycin (ZITHROMAX) 250 MG tablet Take 250-500 mg by mouth See admin instructions. Starting on 05/12/2019 take 500mg  on day 1 then take 250mg  on days 2  through 5   05/14/2019 at Unknown time  . carbidopa-levodopa (SINEMET IR) 25-100 MG tablet Take 1 tablet by mouth 3 (three) times daily. 270 tablet 1 05/14/2019 at Unknown time  . Fluticasone-Salmeterol (ADVAIR) 100-50 MCG/DOSE AEPB Inhale 1 puff into the lungs daily.   05/14/2019 at Unknown time  . folic acid (FOLVITE) 1 MG tablet Take 1 mg by mouth daily.   05/14/2019 at Unknown time  . levothyroxine (SYNTHROID, LEVOTHROID) 200 MCG tablet Take 200 mcg by mouth at bedtime.    05/13/2019 at Unknown time  . losartan (COZAAR) 50 MG tablet Take 50 mg by mouth at bedtime.  8 05/13/2019 at Unknown time  . methotrexate (RHEUMATREX) 2.5 MG tablet Take 15 mg by mouth once a week. Caution:Chemotherapy. Protect from light.   Past Week at Unknown time  . Omega-3 Fatty Acids (FISH OIL) 1200 MG CAPS Take 1,200 mg by mouth at bedtime. 360 MG OMEGA-3   05/13/2019 at Unknown time  . omeprazole (PRILOSEC) 40 MG capsule Take 40 mg by mouth at bedtime.    05/13/2019 at Unknown time  . predniSONE (DELTASONE) 10 MG tablet Take 10 mg by mouth daily with breakfast.   05/14/2019 at Unknown time  . rOPINIRole (REQUIP) 0.5 MG tablet Take 0.5 mg by mouth at bedtime.   05/13/2019 at Unknown time  . tiZANidine (ZANAFLEX) 4 MG tablet Take 1 tablet (4 mg total) by mouth every 6 (six) hours as needed for muscle spasms. (Patient taking differently: Take 4 mg by mouth at bedtime. ) 40 tablet 1 05/13/2019 at Unknown time  . Vitamin D, Ergocalciferol, (DRISDOL) 1.25 MG (50000 UT) CAPS capsule Take 50,000 Units by mouth every  7 (seven) days.   Past Week at Unknown time    Family History  Problem Relation Age of Onset  . Heart disease Other   . Arthritis Other   . Heart disease Mother   . Alzheimer's disease Father   . Rheum arthritis Sister   . Rheum arthritis Brother   . Healthy Son    Review of Systems:     General Review of Systems: [Y] = yes [N ]=no Constitional: recent weight change [Y-loss of 10 pounds  ]; anorexia [ Y ]; fatigue  [ Y ]; nausea [ N ]; night sweats [ N ]; fever [ N ]; or chills Aqua.Slicker  ]                                                     Eye :  Amaurosis fugax[ N ]; Resp: cough [Y  ];  wheezing[N  ];  hemoptysis[N  ];  GI:  gallstones[ Y ], vomiting[N  ];   melena[ N ];  hematochezia Aqua.Slicker  ];  GU: hematuria[N  ];               Skin: peripheral edema[N  ];  Musculosketetal:  back pain[Y  ];  Heme/Lymph: anemia[N  ];  Neuro: TIA[ N ];  stroke[ N ]; seizures[N  ];       Physical Exam: BP 115/65 (BP Location: Left Arm)   Pulse 70   Temp 98.2 F (36.8 C) (Oral)   Resp 17   Ht 6\' 1"  (1.854 m)   Wt 98.1 kg Comment:  scale b  SpO2 92%   BMI 28.52 kg/m    General appearance: alert, cooperative and no distress Head: Normocephalic, without obvious abnormality, atraumatic Neck: supple, symmetrical, trachea midline Resp: Clear on the left and diminished right base Cardio: RRR GI: Soft, non tender, bowel sounds present Extremities: No LE edema Neurologic: Has hand tremors L>R (has Parkinson's  Diagnostic Studies & Laboratory data:   CLINICAL DATA:  Shortness of breath  EXAM: CT ANGIOGRAPHY CHEST WITH CONTRAST  TECHNIQUE: Multidetector CT imaging of the chest was performed using the standard protocol during bolus administration of intravenous contrast. Multiplanar CT image reconstructions and MIPs were obtained to evaluate the vascular anatomy.  CONTRAST:  45mL OMNIPAQUE IOHEXOL 350 MG/ML SOLN  COMPARISON:  Correlation made with chest x-ray from the same day  FINDINGS: Cardiovascular: The patient is status post prior CABG. Aortic calcifications are noted. There is no pulmonary embolus identified.  The ascending aorta is ectatic measuring up to approximately 4.2 cm in diameter. Dense coronary artery calcifications are noted.  Mediastinum/Nodes: No enlarged mediastinal, hilar, or axillary lymph nodes. Thyroid gland, trachea, and esophagus demonstrate no significant findings. There are  no pathologically enlarged axillary or supraclavicular lymph nodes.  Lungs/Pleura: There is a large right-sided pleural effusion that is likely to some degree loculated. Multiple calcified pleural based plaques are noted primarily at the right lung base. There is some mild areas of pleural thickening superiorly. There is near complete collapse of the right lower lobe. The trachea is unremarkable. There is partial collapse of the right middle lobe.  Upper Abdomen: There is cholelithiasis without CT evidence of acute cholecystitis. There is a 2 point 1 cm cystic appearing lesion near the pancreatic body (axial series 5, image 329). The visualized stomach is  unremarkable. There may be a small nonobstructing stone versus vascular calcification in the interpolar region of the left kidney.  Musculoskeletal: No chest wall abnormality. No acute or significant osseous findings.  Review of the MIP images confirms the above findings.  IMPRESSION: 1. No PE. 2. Large, likely somewhat loculated, right-sided pleural effusion with near complete collapse of the right lower lobe. 3. Multiple calcified pleural based plaques are noted on the right. There is some mild pleural thickening towards the right lung apex. Findings can be seen in patients with prior asbestos exposure. 4. Ectatic thoracic aorta measuring approximately 4.2 cm in diameter. Recommend annual imaging followup by CTA or MRA. This recommendation follows 2010 ACCF/AHA/AATS/ACR/ASA/SCA/SCAI/SIR/STS/SVM Guidelines for the Diagnosis and Management of Patients with Thoracic Aortic Disease. Circulation. 2010; 121: Q947-M546. Aortic aneurysm NOS (ICD10-I71.9) 5. Cholelithiasis without CT evidence of acute cholecystitis. 6. 2.1 cm cystic pancreatic lesion. Follow-up contrast enhanced MRI is recommended 1 year. This is favored to represent a benign etiology given its appearance.  Aortic Atherosclerosis (ICD10-I70.0).   Electronically  Signed   By: Constance Holster M.D.   On: 05/14/2019 21:26   Recent Radiology Findings:   CLINICAL DATA:  Status post RIGHT-sided thoracentesis. Chest pain during inspiration.  EXAM: PORTABLE CHEST 1 VIEW  COMPARISON:  Chest x-ray dated 05/14/2019. Chest CT dated 05/14/2019.  FINDINGS: Slightly decreased opacity at the RIGHT lung base, compatible with residual pleural effusion and/or airspace collapse. LEFT lung remains clear. No pneumothorax seen. Heart size and mediastinal contours are stable.  IMPRESSION: 1. Status post RIGHT-sided thoracentesis, per provided clinical data. Slightly improved aeration at the RIGHT lung base. Residual opacity at the RIGHT lung base likely represents smaller pleural effusion and/or airspace collapse. 2. No pneumothorax seen.  I have independently reviewed the above radiologic studies and discussed with the patient   Recent Lab Findings: Lab Results  Component Value Date   WBC 10.2 05/17/2019   HGB 13.6 05/17/2019   HCT 41.6 05/17/2019   PLT 282 05/17/2019   GLUCOSE 101 (H) 05/17/2019   ALT 31 01/26/2014   AST 19 01/26/2014   NA 138 05/17/2019   K 3.8 05/17/2019   CL 105 05/17/2019   CREATININE 1.22 05/17/2019   BUN 19 05/17/2019   CO2 24 05/17/2019   INR 1.0 05/14/2019    Assessment / Plan:   1. Right loculated pleural effusion,pleural plaques-s/p thoracentesis with 1000 ml of bloody pleural fluid obtained. Pathology of pleural fluid showed REACTIVE MESOTHELIAL CELLS PRESENT and  CHRONIC INFLAMMATION. [name] to evaluate but likely needs a right VATS and pleural biopsies (concern for mesothelioma). This would be done on Friday 05/29. 2. History of CAD, hypertension-on ecasa and Losartan 3. History of hypothyroidism-on Levothyroxine 200 mcg daily 3. History of Parkinson's disease-on Sinimet IR. He has an appointment with [facility] on 06/09. 4. History of RA-on Methotrexate 15 mg (prior to admission) weekly and  Prednisone 10 mg daily 5. Incidental cystic pancreatic lesion 2.1 cm seen on CT   I  spent 20 minutes counseling the patient face to face.   Lars Pinks PA-C 05/18/2019 2:09 PM  Patient examined, images of CT scan of chest and previous chest x-rays personally reviewed. In 2018 in 2019 his chest x-rays were perfectly clear. He now has a large right pleural effusion with some pleural plaque noted on CT.   Patient is chronically immune suppressed on methotrexate and prednisone I suspect he has a post pneumonic effusion-empyema rather than a malignant process but the patient  needs a right VATS to provide adequate drainage and decortication of the right lower lobe which appears entrapped. We will proceed with surgery on Friday 5-29 He had CABG at [facility] 9 years ago.  His echo on this admit shows EF of 60% without valvular disease and he denies symptoms of angina. The patient has history of ITP but has a normal platelet count.

## 2019-05-18 NOTE — Progress Notes (Signed)
Sputum collected and send to the lab. Staff not able to process sample because they are unable to find original order. Place the same order for sputum culture.

## 2019-05-18 NOTE — Progress Notes (Signed)
   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  Patient reports 10lb wt loss in 1 month but attributes it to some esophageal concerns.    Past Medical History  As below  Significant Hospital Events   5/23 with CT of the chest with loculated pleural effusion  Consults:  PCCM  Procedures:  N/A  Significant Diagnostic Tests:  CT of the chest  (reviewed)  with pleural effusion on the right and pleural plaques  Micro Data:  N/A  Antimicrobials:  N/A   Interim history/subjective:   Complains of right-sided chest pain when he coughs Afebrile  Objective   Blood pressure 115/65, pulse 70, temperature 98.2 F (36.8 C), temperature source Oral, resp. rate 17, height 6\' 1"  (1.854 m), weight 98.1 kg, SpO2 92 %.        Intake/Output Summary (Last 24 hours) at 05/18/2019 1225 Last data filed at 05/18/2019 4332 Gross per 24 hour  Intake 460 ml  Output 1700 ml  Net -1240 ml   Filed Weights   05/16/19 0539 05/17/19 0452 05/18/19 0529  Weight: 98.7 kg 98.1 kg 98.1 kg    Examination: General: Chronically ill appearing elderly man, no distress HENT: [place]/AT, PERRL, EOM-I and MMM Lungs: Decrease BS on the right, no rhonchi Cardiovascular: RRR, Nl S1/S2 and -M/R/G Abdomen: Soft, NT, ND and +BS Extremities: -edema and -tenderness Neuro: Alert and interactive, nonfocal, bilateral coarse tremors Skin: thin but intact  Resolved Hospital Problem list   N/A  Assessment & Plan:  85 year old male with asbestos exposure and right sided semi-loculated pleural effusion.    Pleural effusion:  -This appears to be a  lymphocytic exudate, cytology is negative, few mesothelial cells .  There appears to be an element of trapped lung since he had considerable chest pain after 1 L of fluid was removed.  Unlikely to be a rheumatoid effusion although low glucose  Pleural plaque:  - Concern for mesothelioma, could be simply related to asbestos exposure or asbestosis.    Options here include a wait and watch approach with perhaps an outpatient PET scan versus proceed with pleural biopsy.  Effusion is likely to worsen Will obtain T CTS opinion   Rakesh V. Elsworth Soho MD [phone]

## 2019-05-19 ENCOUNTER — Inpatient Hospital Stay (HOSPITAL_COMMUNITY): Payer: Medicare Other

## 2019-05-19 DIAGNOSIS — M7989 Other specified soft tissue disorders: Secondary | ICD-10-CM

## 2019-05-19 DIAGNOSIS — Z01810 Encounter for preprocedural cardiovascular examination: Secondary | ICD-10-CM

## 2019-05-19 LAB — CBC
HCT: 39.3 % (ref 39.0–52.0)
Hemoglobin: 13.1 g/dL (ref 13.0–17.0)
MCH: 31.1 pg (ref 26.0–34.0)
MCHC: 33.3 g/dL (ref 30.0–36.0)
MCV: 93.3 fL (ref 80.0–100.0)
Platelets: 263 10*3/uL (ref 150–400)
RBC: 4.21 MIL/uL — ABNORMAL LOW (ref 4.22–5.81)
RDW: 15 % (ref 11.5–15.5)
WBC: 8.8 10*3/uL (ref 4.0–10.5)
nRBC: 0 % (ref 0.0–0.2)

## 2019-05-19 LAB — PROTIME-INR
INR: 1.1 (ref 0.8–1.2)
Prothrombin Time: 13.6 seconds (ref 11.4–15.2)

## 2019-05-19 LAB — CBC WITH DIFFERENTIAL/PLATELET
Abs Immature Granulocytes: 0.05 10*3/uL (ref 0.00–0.07)
Basophils Absolute: 0 10*3/uL (ref 0.0–0.1)
Basophils Relative: 0 %
Eosinophils Absolute: 0.2 10*3/uL (ref 0.0–0.5)
Eosinophils Relative: 1 %
HCT: 39.4 % (ref 39.0–52.0)
Hemoglobin: 13.2 g/dL (ref 13.0–17.0)
Immature Granulocytes: 1 %
Lymphocytes Relative: 31 %
Lymphs Abs: 3.3 10*3/uL (ref 0.7–4.0)
MCH: 31.2 pg (ref 26.0–34.0)
MCHC: 33.5 g/dL (ref 30.0–36.0)
MCV: 93.1 fL (ref 80.0–100.0)
Monocytes Absolute: 1 10*3/uL (ref 0.1–1.0)
Monocytes Relative: 9 %
Neutro Abs: 6.1 10*3/uL (ref 1.7–7.7)
Neutrophils Relative %: 58 %
Platelets: 268 10*3/uL (ref 150–400)
RBC: 4.23 MIL/uL (ref 4.22–5.81)
RDW: 15 % (ref 11.5–15.5)
WBC: 10.6 10*3/uL — ABNORMAL HIGH (ref 4.0–10.5)
nRBC: 0 % (ref 0.0–0.2)

## 2019-05-19 LAB — URINALYSIS, ROUTINE W REFLEX MICROSCOPIC
Bilirubin Urine: NEGATIVE
Glucose, UA: NEGATIVE mg/dL
Hgb urine dipstick: NEGATIVE
Ketones, ur: NEGATIVE mg/dL
Leukocytes,Ua: NEGATIVE
Nitrite: NEGATIVE
Protein, ur: NEGATIVE mg/dL
Specific Gravity, Urine: 1.012 (ref 1.005–1.030)
pH: 6 (ref 5.0–8.0)

## 2019-05-19 LAB — BLOOD GAS, ARTERIAL
Acid-Base Excess: 1.8 mmol/L (ref 0.0–2.0)
Bicarbonate: 25.8 mmol/L (ref 20.0–28.0)
Drawn by: 51155
O2 Saturation: 90.8 %
Patient temperature: 98.6
pCO2 arterial: 40 mmHg (ref 32.0–48.0)
pH, Arterial: 7.425 (ref 7.350–7.450)
pO2, Arterial: 62.3 mmHg — ABNORMAL LOW (ref 83.0–108.0)

## 2019-05-19 LAB — COMPREHENSIVE METABOLIC PANEL
ALT: 9 U/L (ref 0–44)
AST: 17 U/L (ref 15–41)
Albumin: 3 g/dL — ABNORMAL LOW (ref 3.5–5.0)
Alkaline Phosphatase: 38 U/L (ref 38–126)
Anion gap: 6 (ref 5–15)
BUN: 16 mg/dL (ref 8–23)
CO2: 28 mmol/L (ref 22–32)
Calcium: 9 mg/dL (ref 8.9–10.3)
Chloride: 106 mmol/L (ref 98–111)
Creatinine, Ser: 1.47 mg/dL — ABNORMAL HIGH (ref 0.61–1.24)
GFR calc Af Amer: 51 mL/min — ABNORMAL LOW (ref >60)
GFR calc non Af Amer: 44 mL/min — ABNORMAL LOW (ref >60)
Glucose, Bld: 127 mg/dL — ABNORMAL HIGH (ref 70–99)
Potassium: 3.7 mmol/L (ref 3.5–5.1)
Sodium: 140 mmol/L (ref 135–145)
Total Bilirubin: 0.4 mg/dL (ref 0.3–1.2)
Total Protein: 5.9 g/dL — ABNORMAL LOW (ref 6.5–8.1)

## 2019-05-19 LAB — RENAL FUNCTION PANEL
Albumin: 3.1 g/dL — ABNORMAL LOW (ref 3.5–5.0)
Anion gap: 7 (ref 5–15)
BUN: 19 mg/dL (ref 8–23)
CO2: 28 mmol/L (ref 22–32)
Calcium: 9.1 mg/dL (ref 8.9–10.3)
Chloride: 104 mmol/L (ref 98–111)
Creatinine, Ser: 1.24 mg/dL (ref 0.61–1.24)
GFR calc Af Amer: >60 mL/min (ref >60)
GFR calc non Af Amer: 54 mL/min — ABNORMAL LOW (ref >60)
Glucose, Bld: 87 mg/dL (ref 70–99)
Phosphorus: 3.3 mg/dL (ref 2.5–4.6)
Potassium: 3.8 mmol/L (ref 3.5–5.1)
Sodium: 139 mmol/L (ref 135–145)

## 2019-05-19 LAB — BODY FLUID CULTURE: Culture: NO GROWTH

## 2019-05-19 LAB — TSH: TSH: 2.874 u[IU]/mL (ref 0.350–4.500)

## 2019-05-19 LAB — MAGNESIUM: Magnesium: 1.9 mg/dL (ref 1.7–2.4)

## 2019-05-19 LAB — PREPARE RBC (CROSSMATCH)

## 2019-05-19 LAB — APTT: aPTT: 33 seconds (ref 24–36)

## 2019-05-19 MED ORDER — CEFAZOLIN SODIUM-DEXTROSE 2-4 GM/100ML-% IV SOLN
2.0000 g | INTRAVENOUS | Status: AC
  Administered 2019-05-20: 08:00:00 2 g via INTRAVENOUS
  Filled 2019-05-19: qty 100

## 2019-05-19 NOTE — Progress Notes (Signed)
Spoke with Rayan from Dr. Nils Pyle office, per office we do not have to swab patient again, it is part of the order unless patient has symptoms. Patient asymptomatic, already swabbed and negative.

## 2019-05-19 NOTE — Progress Notes (Signed)
Going for video-assisted thoracotomy on 5/29 appreciate thoracic surgery. Pulmonary will sign off Follow-up appointment made in place in computer  Erick Colace ACNP-BC [place] Pager # [phone] OR # [phone] if no answer

## 2019-05-19 NOTE — Progress Notes (Signed)
Jeffrey [name]  PROGRESS NOTE    Jeffrey [name]  ZOX:[id number] DOB: 04-07-1934 DOA: 05/14/2019 PCP: [name], MD   Brief Narrative:   Jeffrey [name] an 85 y.o.malew CAD s/p CABG, ischemic CM, mild Aortic stenosis, Asthma (mild), OSA, Hypothyroidism, Parkinsons, Anxiety, remote hx of MRSE septic arthritis left knee s/p L knee ID/ synovectomy, L THA, Anemia/ ITP apparently presents with c/o cough with white->slight yellow sputum since January of this year. Pt notes dyspnea for the past 4 weeks. Worse over the past 3-4 day   Assessment & Plan:   Principal Problem:   Pleural effusion on right Active Problems:   CAD (coronary artery disease) of artery bypass graft   Hypothyroidism   PD (Parkinson's disease) (HCC)   Pancreatic lesion   Right pleural effusion     - Sputum culture     - Appreciate Pulmonary consult: s/p thoracentesis: concern for mesothelioma (if cytology negative the plan is to repeat thoro?)     - has improved aeration in upper right lung     - vanc and zosyn given x 1 in ER     - echo: EF 55%, some impaired relaxation     - pulm to consult CTS     - VATS tomorrow?  CAD s/p CABG, DES Lcx, DES LAD     - Cont Aspirin 81mg  po qday     - Cont Losartan 50mg  po qday; a little on hypotensive side this AM, place hold parameters  Copd w/o exacerbation     - Cont Breo 1puff qday and albuterol neb bid  H/o Rheumatoid arthritis     - HOLD methotrexate     - Cont Prednisone 10mg  po qday and Folic acid 1mg  po qday  Hypothyroidism     - Cont Levothyroxine 240micrograms po qday  Anxiety     - Xanax 0.25mg  po bid prn  Parkinson     - Cont Sinemet IR 25/100mg  po tid  RLS     - Cont Ropinirole 0.5mg  po qhs and Zanaflex 4mg  po qhs  Gerd     - Cont Protonix  Pancreatic lesion     - MRI as outpatient  Constipation     - PRN miralax   DVT prophylaxis: SCD Code Status: FULL   Disposition Plan: TBD   Consultants:   Pulmonology  CTS  Procedures:    Thoracentesis    Subjective: "I feel better today."  Objective: Vitals:   05/18/19 2019 05/19/19 0544 05/19/19 0815 05/19/19 0830  BP: 140/72 (!) 97/58 120/80   Pulse: 68 65 75   Resp: 18 18 18    Temp: 98.1 F (36.7 C) 98.2 F (36.8 C)    TempSrc: Oral Oral    SpO2: 98% 93% 96% 97%  Weight:  98.5 kg    Height:        Intake/Output Summary (Last 24 hours) at 05/19/2019 1400 Last data filed at 05/19/2019 1310 Gross per 24 hour  Intake 921.47 ml  Output 1300 ml  Net -378.53 ml   Filed Weights   05/17/19 0452 05/18/19 0529 05/19/19 0544  Weight: 98.1 kg 98.1 kg 98.5 kg    Examination:  General exam: 85 y.o. male Appears calm and comfortable  Respiratory system: decreased right base, but clear otherwise Cardiovascular system: S1 & S2 heard, RRR. No JVD, murmurs, rubs, gallops or clicks. No pedal edema. Gastrointestinal system: Abdomen is nondistended, soft and nontender. No organomegaly or masses felt. Normal bowel sounds heard. Central nervous system: Alert  and oriented. No focal neurological deficits. Skin: No rashes, lesions or ulcers    Data Reviewed: I have personally reviewed following labs and imaging studies.  CBC: Recent Labs  Lab 05/14/19 1810 05/16/19 0618 05/17/19 0459 05/19/19 0409 05/19/19 1112  WBC 10.1 9.3 10.2 10.6* 8.8  NEUTROABS 8.1*  --   --  6.1  --   HGB 13.9 13.5 13.6 13.2 13.1  HCT 42.4 40.9 41.6 39.4 39.3  MCV 95.1 94.2 94.3 93.1 93.3  PLT 302 292 282 268 585   Basic Metabolic Panel: Recent Labs  Lab 05/14/19 1810 05/16/19 0618 05/17/19 0459 05/19/19 0409 05/19/19 1112  NA 139 138 138 139 140  K 4.6 3.9 3.8 3.8 3.7  CL 104 102 105 104 106  CO2 21* 27 24 28 28   GLUCOSE 142* 119* 101* 87 127*  BUN 18 18 19 19 16   CREATININE 1.23 1.43* 1.22 1.24 1.47*  CALCIUM 9.3 9.1 9.2 9.1 9.0  MG  --   --   --  1.9  --   PHOS  --   --   --  3.3  --    GFR: Estimated Creatinine Clearance: 47.8 mL/min (A) (by C-G formula based on SCr  of 1.47 mg/dL (H)). Liver Function Tests: Recent Labs  Lab 05/16/19 1217 05/19/19 0409 05/19/19 1112  AST  --   --  17  ALT  --   --  9  ALKPHOS  --   --  38  BILITOT  --   --  0.4  PROT 6.2*  --  5.9*  ALBUMIN  --  3.1* 3.0*   No results for input(s): LIPASE, AMYLASE in the last 168 hours. No results for input(s): AMMONIA in the last 168 hours. Coagulation Profile: Recent Labs  Lab 05/14/19 1810 05/19/19 1112  INR 1.0 1.1   Cardiac Enzymes: Recent Labs  Lab 05/14/19 1810  TROPONINI <0.03   BNP (last 3 results) No results for input(s): PROBNP in the last 8760 hours. HbA1C: No results for input(s): HGBA1C in the last 72 hours. CBG: No results for input(s): GLUCAP in the last 168 hours. Lipid Profile: No results for input(s): CHOL, HDL, LDLCALC, TRIG, CHOLHDL, LDLDIRECT in the last 72 hours. Thyroid Function Tests: Recent Labs    05/19/19 0409  TSH 2.874   Anemia Panel: No results for input(s): VITAMINB12, FOLATE, FERRITIN, TIBC, IRON, RETICCTPCT in the last 72 hours. Sepsis Labs: Recent Labs  Lab 05/14/19 1841 05/14/19 2024  LATICACIDVEN 1.4 1.3    Recent Results (from the past 240 hour(s))  SARS Coronavirus 2 (CEPHEID - Performed in [facility] hospital lab), Hosp Order     Status: None   Collection Time: 05/14/19  6:40 PM  Result Value Ref Range Status   SARS Coronavirus 2 NEGATIVE NEGATIVE Final    Comment: (NOTE) If result is NEGATIVE SARS-CoV-2 target nucleic acids are NOT DETECTED. The SARS-CoV-2 RNA is generally detectable in upper and lower  respiratory specimens during the acute phase of infection. The lowest  concentration of SARS-CoV-2 viral copies this assay can detect is 250  copies / mL. A negative result does not preclude SARS-CoV-2 infection  and should not be used as the sole basis for treatment or other  patient management decisions.  A negative result may occur with  improper specimen collection / handling, submission of specimen  other  than nasopharyngeal swab, presence of viral mutation(s) within the  areas targeted by this assay, and inadequate number of viral copies  (<  250 copies / mL). A negative result must be combined with clinical  observations, patient history, and epidemiological information. If result is POSITIVE SARS-CoV-2 target nucleic acids are DETECTED. The SARS-CoV-2 RNA is generally detectable in upper and lower  respiratory specimens dur ing the acute phase of infection.  Positive  results are indicative of active infection with SARS-CoV-2.  Clinical  correlation with patient history and other diagnostic information is  necessary to determine patient infection status.  Positive results do  not rule out bacterial infection or co-infection with other viruses. If result is PRESUMPTIVE POSTIVE SARS-CoV-2 nucleic acids MAY BE PRESENT.   A presumptive positive result was obtained on the submitted specimen  and confirmed on repeat testing.  While 2019 novel coronavirus  (SARS-CoV-2) nucleic acids may be present in the submitted sample  additional confirmatory testing may be necessary for epidemiological  and / or clinical management purposes  to differentiate between  SARS-CoV-2 and other Sarbecovirus currently known to infect humans.  If clinically indicated additional testing with an alternate test  methodology [phone]) is advised. The SARS-CoV-2 RNA is generally  detectable in upper and lower respiratory sp ecimens during the acute  phase of infection. The expected result is Negative. Fact Sheet for Patients:  StrictlyIdeas.no Fact Sheet for Healthcare Providers: BankingDealers.co.za This test is not yet approved or cleared by the [place] FDA and has been authorized for detection and/or diagnosis of SARS-CoV-2 by FDA under an Emergency Use Authorization (EUA).  This EUA will remain in effect (meaning this test can be used) for the duration  of the COVID-19 declaration under Section 564(b)(1) of the Act, 21 U.S.C. section 360bbb-3(b)(1), unless the authorization is terminated or revoked sooner. Performed at [facility], [address], [place], [place] [address]   Body fluid culture     Status: None (Preliminary result)   Collection Time: 05/16/19 12:48 PM  Result Value Ref Range Status   Specimen Description PLEURAL  Final   Special Requests NONE  Final   Gram Stain   Final    MODERATE WBC PRESENT,BOTH PMN AND MONONUCLEAR NO ORGANISMS SEEN    Culture   Final    NO GROWTH 3 DAYS Performed at [facility], [place] [address], [place], [place] [address]    Report Status PENDING  Incomplete  Acid Fast Smear (AFB)     Status: None   Collection Time: 05/16/19 12:48 PM  Result Value Ref Range Status   AFB Specimen Processing Concentration  Final   Acid Fast Smear Negative  Final    Comment: (NOTE) Performed At: [facility] [place], [place] [id number] Rush Farmer MD EH:[id number]    Source (AFB) FLUID  Final    Comment: PLEURAL Performed at [facility], [place] [address] [place], [place] [address]   Surgical pcr screen     Status: None   Collection Time: 05/18/19  5:38 PM  Result Value Ref Range Status   MRSA, PCR NEGATIVE NEGATIVE Final   Staphylococcus aureus NEGATIVE NEGATIVE Final    Comment: (NOTE) The Xpert SA Assay (FDA approved for NASAL specimens in patients 45 years of age and older), is one component of a comprehensive surveillance program. It is not intended to diagnose infection nor to guide or monitor treatment. Performed at [facility], [place] [address], [place], [place] [address]   Expectorated sputum assessment w rflx to resp cult     Status: None   Collection Time: 05/18/19  7:25 PM  Result Value Ref Range Status   Specimen Description EXPECTORATED SPUTUM  Final   Special Requests Normal  Final   Sputum evaluation   Final    THIS SPECIMEN IS ACCEPTABLE  FOR SPUTUM CULTURE Performed at [facility], [address] [place], [place] [address]    Report Status 05/18/2019 FINAL  Final  Culture, respiratory     Status: None (Preliminary result)   Collection Time: 05/18/19  7:25 PM  Result Value Ref Range Status   Specimen Description EXPECTORATED SPUTUM  Final   Special Requests Normal Reflexed from [id number]  Final   Gram Stain   Final    RARE WBC PRESENT,BOTH PMN AND MONONUCLEAR FEW GRAM POSITIVE COCCI RARE GRAM NEGATIVE RODS    Culture   Final    CULTURE REINCUBATED FOR BETTER GROWTH Performed at [facility], [place] [address] [place], [place] [address]    Report Status PENDING  Incomplete       Radiology Studies: No results found.    Scheduled Meds: . aspirin EC  81 mg Oral Daily  . carbidopa-levodopa  1 tablet Oral TID  . fluticasone furoate-vilanterol  1 puff Inhalation Daily  . folic acid  1 mg Oral Daily  . levothyroxine  200 mcg Oral QHS  . losartan  50 mg Oral QHS  . pantoprazole  40 mg Oral Daily  . predniSONE  10 mg Oral Q breakfast  . rOPINIRole  0.5 mg Oral QHS  . sodium chloride flush  3 mL Intravenous Q12H  . tiZANidine  4 mg Oral QHS   Continuous Infusions: . sodium chloride    . sodium chloride 50 mL/hr at 05/19/19 0829  . [START ON 05/20/2019]  ceFAZolin (ANCEF) IV       LOS: 4 days    Time spent: 25 minutes spent in the coordination of care today.     Jonnie Finner, DO [facility] Pager [phone]  If 7PM-7AM, please contact night-coverage [email or web address] Password [email or web address] 05/19/2019, 2:00 PM

## 2019-05-19 NOTE — Progress Notes (Signed)
Bilateral lower extremity venous duplex completed. Refer to "CV Proc" under chart review to view preliminary results.  05/19/2019 5:58 PM Maudry Mayhew, MHA, RVT, RDCS, RDMS

## 2019-05-19 NOTE — Anesthesia Preprocedure Evaluation (Addendum)
Anesthesia Evaluation  Patient identified by MRN, date of birth, ID band Patient awake    Reviewed: Allergy & Precautions, NPO status , Patient's Chart, lab work & pertinent test results  History of Anesthesia Complications Negative for: history of anesthetic complications  Airway Mallampati: II  TM Distance: >3 FB Neck ROM: Full    Dental  (+) Dental Advisory Given, Partial Upper, Partial Lower   Pulmonary shortness of breath, sleep apnea , former smoker,   Right pleural effusion    breath sounds clear to auscultation       Cardiovascular hypertension, Pt. on medications + CAD, + Past MI, + Cardiac Stents and + CABG  + Valvular Problems/Murmurs  Rhythm:Regular Rate:Normal + Systolic murmurs  '20 TTE - EF 55-60%. Left ventricular diastolic Doppler parameters are consistent with impaired relaxation. Mild AI and AS. Mild dilatation of the aortic root.    Neuro/Psych PSYCHIATRIC DISORDERS Anxiety Depression  B/l hearing aids Parkinson disease     GI/Hepatic Neg liver ROS, hiatal hernia, GERD  Medicated,  Endo/Other  Hypothyroidism   Renal/GU Renal InsufficiencyRenal disease     Musculoskeletal  (+) Arthritis , Rheumatoid disorders,    Abdominal   Peds  Hematology  ITP    Anesthesia Other Findings   Reproductive/Obstetrics                            Anesthesia Physical Anesthesia Plan  ASA: III  Anesthesia Plan: General   Post-op Pain Management:    Induction: Intravenous  PONV Risk Score and Plan: 3 and Treatment may vary due to age or medical condition, Ondansetron and Propofol infusion  Airway Management Planned: Double Lumen EBT  Additional Equipment: Arterial line  Intra-op Plan:   Post-operative Plan: Possible Post-op intubation/ventilation  Informed Consent: I have reviewed the patients History and Physical, chart, labs and discussed the procedure including the  risks, benefits and alternatives for the proposed anesthesia with the patient or authorized representative who has indicated his/her understanding and acceptance.     Dental advisory given  Plan Discussed with: CRNA and Anesthesiologist  Anesthesia Plan Comments: ( 2 large bore peripheral IVs Vivasight DLEBT )       Anesthesia Quick Evaluation

## 2019-05-19 NOTE — TOC Initial Note (Signed)
Transition of Care [facility]) - Initial/Assessment Note    Patient Details  Name: Jeffrey [name] MRN: [id number] Date of Birth: 1934/04/17  [facility] CM/SW Contact:    Sherrilyn Rist Phone Number: [phone] 05/19/2019, 3:10 PM  Clinical Narrative:                 Patient lives at home with spouse; Outpatient Primary MD for the patient is [name], MD; has private insurance with [facility] with prescription drug coverage; patient is from [place], [place]; he is for VATS tomorrow; CM will continue to follow for progression of care.  Expected Discharge Plan: [place] Barriers to Discharge: No Barriers Identified   Patient Goals and CMS Choice Patient states their goals for this hospitalization and ongoing recovery are:: to get better      Expected Discharge Plan and Services Expected Discharge Plan: [place] In-house Referral: NA Discharge Planning Services: CM Consult Post Acute Care Choice: NA Living arrangements for the past 2 months: Single Family Home                 DME Arranged: N/A DME Agency: NA       HH Arranged: NA HH Agency: NA        Prior Living Arrangements/Services Living arrangements for the past 2 months: Single Family Home Lives with:: Spouse          Need for Family Participation in Patient Care: Yes (Comment) Care giver support system in place?: Yes (comment)      Activities of Daily Living Home Assistive Devices/Equipment: Walker (specify type) ADL Screening (condition at time of admission) Patient's cognitive ability adequate to safely complete daily activities?: Yes Is the patient deaf or have difficulty hearing?: No Does the patient have difficulty seeing, even when wearing glasses/contacts?: No Does the patient have difficulty concentrating, remembering, or making decisions?: No Patient able to express need for assistance with ADLs?: Yes Does the patient have  difficulty dressing or bathing?: No Independently performs ADLs?: Yes (appropriate for developmental age) Does the patient have difficulty walking or climbing stairs?: No Weakness of Legs: None Weakness of Arms/Hands: None  Permission Sought/Granted                  Emotional Assessment       Orientation: : Oriented to Self, Oriented to  Time, Oriented to Place, Oriented to Situation      Admission diagnosis:  Shortness of breath [R06.02] Pancreatic cyst [K86.2] Hypoxia [R09.02] Pleural effusion on right [J90] Pleural plaque [J92.9] Biliary calculus of other site without obstruction [K80.80] Patient Active Problem List   Diagnosis Date Noted  . Pleural effusion on right 05/14/2019  . Pancreatic lesion 05/14/2019  . PD (Parkinson's disease) ([place]) 12/02/2018  . Primary osteoarthritis of right hip 06/29/2018  . Primary osteoarthritis of left knee 11/24/2014  . Primary osteoarthritis of knee 11/24/2014  . Bacterial infection of knee joint ([place]) 08/17/2014  . Septic joint of left knee joint ([place]) 06/22/2014  . CAP (community acquired pneumonia) 01/26/2014  . CAD (coronary artery disease) of artery bypass graft 01/26/2014  . Hypothyroidism 01/26/2014  . Thrombocytopenia, unspecified ([place]) 01/26/2014  . Nausea alone 01/26/2014  . Effusion of knee joint, left 10/11/2012  . OA (osteoarthritis) of knee 10/11/2012  . HIP PAIN 11/15/2007  . 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

## 2019-05-19 NOTE — Progress Notes (Signed)
Contacted TCTS office and spoke with Rayan. In order set patient has  COVID test ordered. Patient already has been swabbed and he is negative. Wanted to make sure should we do it again or this is just part of the order set. Per Rayan she will contact MD  And verified this for me.

## 2019-05-19 NOTE — Progress Notes (Signed)
Procedure(s) (LRB): VIDEO ASSISTED THORACOSCOPY (Right) PLEURAL BIOPSY (Right) Possible, INSERTION PLEURAL DRAINAGE CATHETER (Right) Subjective: SOB, cough from large R pleural effusion, entrapped RLL  Objective: Vital signs in last 24 hours: Temp:  [98.1 F (36.7 C)-98.2 F (36.8 C)] 98.2 F (36.8 C) (05/28 0544) Pulse Rate:  [65-75] 75 (05/28 0815) Cardiac Rhythm: Normal sinus rhythm (05/28 0732) Resp:  [18] 18 (05/28 0815) BP: (97-140)/(58-80) 120/80 (05/28 0815) SpO2:  [93 %-98 %] 97 % (05/28 0830) Weight:  [98.5 kg] 98.5 kg (05/28 0544)  Hemodynamic parameters for last 24 hours:  nsr  Intake/Output from previous day: 05/27 0701 - 05/28 0700 In: 1211.5 [P.O.:810; I.V.:401.5] Out: 1125 [Urine:1125] Intake/Output this shift: Total I/O In: 210 [P.O.:210] Out: 575 [Urine:575]  Decreased BS R base Rest tremor  Lab Results: Recent Labs    05/19/19 0409 05/19/19 1112  WBC 10.6* 8.8  HGB 13.2 13.1  HCT 39.4 39.3  PLT 268 263   BMET:  Recent Labs    05/19/19 0409 05/19/19 1112  NA 139 140  K 3.8 3.7  CL 104 106  CO2 28 28  GLUCOSE 87 127*  BUN 19 16  CREATININE 1.24 1.47*  CALCIUM 9.1 9.0    PT/INR:  Recent Labs    05/19/19 1112  LABPROT 13.6  INR 1.1   ABG    Component Value Date/Time   PHART 7.425 05/19/2019 0440   HCO3 25.8 05/19/2019 0440   O2SAT 90.8 05/19/2019 0440   CBG (last 3)  No results for input(s): GLUCAP in the last 72 hours.  Assessment/Plan: S/P Procedure(s) (LRB): VIDEO ASSISTED THORACOSCOPY (Right) PLEURAL BIOPSY (Right) Possible, INSERTION PLEURAL DRAINAGE CATHETER (Right) R VATS in am for drainage of pleural effusion/empyema Benefits and risks d/w patient  LOS: 4 days    Tharon Aquas Trigt III 05/19/2019

## 2019-05-20 ENCOUNTER — Inpatient Hospital Stay (HOSPITAL_COMMUNITY): Payer: Medicare Other

## 2019-05-20 ENCOUNTER — Inpatient Hospital Stay (HOSPITAL_COMMUNITY): Payer: Medicare Other | Admitting: Anesthesiology

## 2019-05-20 ENCOUNTER — Encounter (HOSPITAL_COMMUNITY)
Admission: EM | Disposition: A | Discharge status: Home Health | Payer: Self-pay | Source: Home / Self Care | Attending: Internal Medicine

## 2019-05-20 DIAGNOSIS — J90 Pleural effusion, not elsewhere classified: Secondary | ICD-10-CM

## 2019-05-20 HISTORY — PX: PLEURAL BIOPSY: SHX5082

## 2019-05-20 HISTORY — PX: VIDEO ASSISTED THORACOSCOPY: SHX5073

## 2019-05-20 HISTORY — PX: CHEST TUBE INSERTION: SHX231

## 2019-05-20 LAB — CBC WITH DIFFERENTIAL/PLATELET
Abs Immature Granulocytes: 0.04 10*3/uL (ref 0.00–0.07)
Basophils Absolute: 0 10*3/uL (ref 0.0–0.1)
Basophils Relative: 0 %
Eosinophils Absolute: 0.1 10*3/uL (ref 0.0–0.5)
Eosinophils Relative: 1 %
HCT: 38.3 % — ABNORMAL LOW (ref 39.0–52.0)
Hemoglobin: 12.8 g/dL — ABNORMAL LOW (ref 13.0–17.0)
Immature Granulocytes: 0 %
Lymphocytes Relative: 30 %
Lymphs Abs: 3.3 10*3/uL (ref 0.7–4.0)
MCH: 31.5 pg (ref 26.0–34.0)
MCHC: 33.4 g/dL (ref 30.0–36.0)
MCV: 94.3 fL (ref 80.0–100.0)
Monocytes Absolute: 1.1 10*3/uL — ABNORMAL HIGH (ref 0.1–1.0)
Monocytes Relative: 10 %
Neutro Abs: 6.4 10*3/uL (ref 1.7–7.7)
Neutrophils Relative %: 59 %
Platelets: 254 10*3/uL (ref 150–400)
RBC: 4.06 MIL/uL — ABNORMAL LOW (ref 4.22–5.81)
RDW: 15 % (ref 11.5–15.5)
WBC: 11 10*3/uL — ABNORMAL HIGH (ref 4.0–10.5)
nRBC: 0 % (ref 0.0–0.2)

## 2019-05-20 LAB — RENAL FUNCTION PANEL
Albumin: 3 g/dL — ABNORMAL LOW (ref 3.5–5.0)
Anion gap: 9 (ref 5–15)
BUN: 15 mg/dL (ref 8–23)
CO2: 25 mmol/L (ref 22–32)
Calcium: 9.1 mg/dL (ref 8.9–10.3)
Chloride: 105 mmol/L (ref 98–111)
Creatinine, Ser: 1.22 mg/dL (ref 0.61–1.24)
GFR calc Af Amer: >60 mL/min (ref >60)
GFR calc non Af Amer: 55 mL/min — ABNORMAL LOW (ref >60)
Glucose, Bld: 102 mg/dL — ABNORMAL HIGH (ref 70–99)
Phosphorus: 3.5 mg/dL (ref 2.5–4.6)
Potassium: 3.9 mmol/L (ref 3.5–5.1)
Sodium: 139 mmol/L (ref 135–145)

## 2019-05-20 LAB — MAGNESIUM: Magnesium: 1.9 mg/dL (ref 1.7–2.4)

## 2019-05-20 SURGERY — VIDEO ASSISTED THORACOSCOPY
Anesthesia: General | Site: Chest | Laterality: Right

## 2019-05-20 MED ORDER — ROCURONIUM BROMIDE 10 MG/ML (PF) SYRINGE
PREFILLED_SYRINGE | INTRAVENOUS | Status: AC
  Filled 2019-05-20: qty 10

## 2019-05-20 MED ORDER — 0.9 % SODIUM CHLORIDE (POUR BTL) OPTIME
TOPICAL | Status: DC | PRN
  Administered 2019-05-20: 2000 mL

## 2019-05-20 MED ORDER — SODIUM CHLORIDE 0.9% FLUSH: 9.0000 mL | INTRAVENOUS | Status: DC | PRN

## 2019-05-20 MED ORDER — CEFAZOLIN SODIUM-DEXTROSE 2-4 GM/100ML-% IV SOLN
2.0000 g | Freq: Three times a day (TID) | INTRAVENOUS | Status: AC
  Administered 2019-05-20 – 2019-05-21 (×2): 2 g via INTRAVENOUS
  Filled 2019-05-20 (×2): qty 100

## 2019-05-20 MED ORDER — IPRATROPIUM BROMIDE 0.02 % IN SOLN
0.5000 mg | RESPIRATORY_TRACT | Status: DC
  Administered 2019-05-20: 16:00:00 0.5 mg via RESPIRATORY_TRACT
  Filled 2019-05-20 (×2): qty 2.5

## 2019-05-20 MED ORDER — ALBUTEROL SULFATE (2.5 MG/3ML) 0.083% IN NEBU
2.5000 mg | INHALATION_SOLUTION | RESPIRATORY_TRACT | Status: DC
  Administered 2019-05-20: 2.5 mg via RESPIRATORY_TRACT
  Filled 2019-05-20: qty 3

## 2019-05-20 MED ORDER — NALOXONE HCL 0.4 MG/ML IJ SOLN: 0.4000 mg | INTRAMUSCULAR | Status: DC | PRN

## 2019-05-20 MED ORDER — FENTANYL CITRATE (PF) 100 MCG/2ML IJ SOLN
INTRAMUSCULAR | Status: DC | PRN
  Administered 2019-05-20: 25 ug via INTRAVENOUS
  Administered 2019-05-20 (×4): 50 ug via INTRAVENOUS
  Administered 2019-05-20: 100 ug via INTRAVENOUS

## 2019-05-20 MED ORDER — DIPHENHYDRAMINE HCL 12.5 MG/5ML PO ELIX: 12.5000 mg | ORAL_SOLUTION | Freq: Four times a day (QID) | ORAL | Status: DC | PRN

## 2019-05-20 MED ORDER — PROPOFOL 10 MG/ML IV BOLUS
INTRAVENOUS | Status: DC | PRN
  Administered 2019-05-20: 100 mg via INTRAVENOUS

## 2019-05-20 MED ORDER — POTASSIUM CHLORIDE 10 MEQ/50ML IV SOLN: 10.0000 meq | Freq: Every day | INTRAVENOUS | Status: DC | PRN

## 2019-05-20 MED ORDER — LIDOCAINE 2% (20 MG/ML) 5 ML SYRINGE
INTRAMUSCULAR | Status: AC
  Filled 2019-05-20: qty 5

## 2019-05-20 MED ORDER — ACETAMINOPHEN 500 MG PO TABS
1000.0000 mg | ORAL_TABLET | Freq: Four times a day (QID) | ORAL | Status: AC
  Administered 2019-05-20 – 2019-05-25 (×14): 1000 mg via ORAL
  Filled 2019-05-20 (×14): qty 2

## 2019-05-20 MED ORDER — ACETAMINOPHEN 160 MG/5ML PO SOLN
1000.0000 mg | Freq: Four times a day (QID) | ORAL | Status: AC
  Filled 2019-05-20: qty 40.6

## 2019-05-20 MED ORDER — SENNOSIDES-DOCUSATE SODIUM 8.6-50 MG PO TABS
1.0000 | ORAL_TABLET | Freq: Every day | ORAL | Status: DC
  Administered 2019-05-20 – 2019-05-25 (×6): 1 via ORAL
  Filled 2019-05-20 (×7): qty 1

## 2019-05-20 MED ORDER — FENTANYL CITRATE (PF) 250 MCG/5ML IJ SOLN
INTRAMUSCULAR | Status: AC
  Filled 2019-05-20: qty 5

## 2019-05-20 MED ORDER — BISACODYL 5 MG PO TBEC
10.0000 mg | DELAYED_RELEASE_TABLET | Freq: Every day | ORAL | Status: DC
  Administered 2019-05-21 – 2019-05-26 (×5): 10 mg via ORAL
  Filled 2019-05-20 (×6): qty 2

## 2019-05-20 MED ORDER — OXYCODONE HCL 5 MG PO TABS: 5.0000 mg | ORAL_TABLET | Freq: Once | ORAL | Status: DC | PRN

## 2019-05-20 MED ORDER — LIDOCAINE 2% (20 MG/ML) 5 ML SYRINGE
INTRAMUSCULAR | Status: DC | PRN
  Administered 2019-05-20: 60 mg via INTRAVENOUS

## 2019-05-20 MED ORDER — FENTANYL CITRATE (PF) 100 MCG/2ML IJ SOLN
25.0000 ug | INTRAMUSCULAR | Status: DC | PRN
  Administered 2019-05-20 (×2): 50 ug via INTRAVENOUS

## 2019-05-20 MED ORDER — IPRATROPIUM-ALBUTEROL 0.5-2.5 (3) MG/3ML IN SOLN
3.0000 mL | RESPIRATORY_TRACT | Status: DC
  Administered 2019-05-20: 23:00:00 3 mL via RESPIRATORY_TRACT
  Filled 2019-05-20: qty 3

## 2019-05-20 MED ORDER — ONDANSETRON HCL 4 MG/2ML IJ SOLN
4.0000 mg | Freq: Four times a day (QID) | INTRAMUSCULAR | Status: DC | PRN
  Administered 2019-05-21 – 2019-05-22 (×3): 4 mg via INTRAVENOUS
  Filled 2019-05-20 (×3): qty 2

## 2019-05-20 MED ORDER — PHENYLEPHRINE 40 MCG/ML (10ML) SYRINGE FOR IV PUSH (FOR BLOOD PRESSURE SUPPORT)
PREFILLED_SYRINGE | INTRAVENOUS | Status: AC
  Filled 2019-05-20: qty 10

## 2019-05-20 MED ORDER — SUGAMMADEX SODIUM 200 MG/2ML IV SOLN
INTRAVENOUS | Status: DC | PRN
  Administered 2019-05-20: 200 mg via INTRAVENOUS

## 2019-05-20 MED ORDER — ROCURONIUM BROMIDE 50 MG/5ML IV SOSY
PREFILLED_SYRINGE | INTRAVENOUS | Status: DC | PRN
  Administered 2019-05-20: 50 mg via INTRAVENOUS

## 2019-05-20 MED ORDER — SODIUM CHLORIDE 0.9 % IV SOLN
INTRAVENOUS | Status: DC
  Administered 2019-05-20: 13:00:00 via INTRAVENOUS
  Administered 2019-05-21: 75 mL/h via INTRAVENOUS
  Administered 2019-05-21 – 2019-05-25 (×2): via INTRAVENOUS

## 2019-05-20 MED ORDER — OXYCODONE HCL 5 MG/5ML PO SOLN: 5.0000 mg | Freq: Once | ORAL | Status: DC | PRN

## 2019-05-20 MED ORDER — ONDANSETRON HCL 4 MG/2ML IJ SOLN: 4.0000 mg | Freq: Once | INTRAMUSCULAR | Status: DC | PRN

## 2019-05-20 MED ORDER — TALC (STERITALC) POWDER FOR INTRAPLEURAL USE
INTRAPLEURAL | Status: AC
  Filled 2019-05-20: qty 4

## 2019-05-20 MED ORDER — LACTATED RINGERS IV SOLN
INTRAVENOUS | Status: DC | PRN
  Administered 2019-05-20: 08:00:00 via INTRAVENOUS

## 2019-05-20 MED ORDER — ONDANSETRON HCL 4 MG/2ML IJ SOLN
INTRAMUSCULAR | Status: AC
  Filled 2019-05-20: qty 2

## 2019-05-20 MED ORDER — ONDANSETRON HCL 4 MG/2ML IJ SOLN
INTRAMUSCULAR | Status: DC | PRN
  Administered 2019-05-20: 4 mg via INTRAVENOUS

## 2019-05-20 MED ORDER — TALC 5 G PL SUSR
INTRAPLEURAL | Status: DC | PRN
  Administered 2019-05-20: 4 g via INTRAPLEURAL

## 2019-05-20 MED ORDER — PROPOFOL 10 MG/ML IV BOLUS
INTRAVENOUS | Status: AC
  Filled 2019-05-20: qty 20

## 2019-05-20 MED ORDER — ALBUTEROL SULFATE (2.5 MG/3ML) 0.083% IN NEBU: 2.5000 mg | INHALATION_SOLUTION | RESPIRATORY_TRACT | Status: DC

## 2019-05-20 MED ORDER — TRAMADOL HCL 50 MG PO TABS
50.0000 mg | ORAL_TABLET | Freq: Four times a day (QID) | ORAL | Status: DC | PRN
  Administered 2019-05-20 – 2019-05-28 (×3): 100 mg via ORAL
  Filled 2019-05-20 (×4): qty 2

## 2019-05-20 MED ORDER — FENTANYL CITRATE (PF) 100 MCG/2ML IJ SOLN
INTRAMUSCULAR | Status: AC
  Filled 2019-05-20: qty 2

## 2019-05-20 MED ORDER — FENTANYL 40 MCG/ML IV SOLN
INTRAVENOUS | Status: DC
  Administered 2019-05-20: 9 ug via INTRAVENOUS
  Administered 2019-05-20: 40 ug via INTRAVENOUS
  Administered 2019-05-21: 7 ug via INTRAVENOUS
  Administered 2019-05-21: 0 ug via INTRAVENOUS
  Administered 2019-05-21: 5 ug via INTRAVENOUS
  Administered 2019-05-21 – 2019-05-22 (×3): 0 ug via INTRAVENOUS
  Filled 2019-05-20: qty 25
  Filled 2019-05-20: qty 1000
  Filled 2019-05-20: qty 25

## 2019-05-20 MED ORDER — DIPHENHYDRAMINE HCL 50 MG/ML IJ SOLN: 12.5000 mg | Freq: Four times a day (QID) | INTRAMUSCULAR | Status: DC | PRN

## 2019-05-20 MED ORDER — OXYCODONE HCL 5 MG PO TABS: 5.0000 mg | ORAL_TABLET | ORAL | Status: DC | PRN

## 2019-05-20 MED ORDER — KETOROLAC TROMETHAMINE 30 MG/ML IJ SOLN
INTRAMUSCULAR | Status: DC | PRN
  Administered 2019-05-20: 30 mg via INTRAVENOUS

## 2019-05-20 MED ORDER — IPRATROPIUM BROMIDE 0.02 % IN SOLN: 0.5000 mg | RESPIRATORY_TRACT | Status: DC

## 2019-05-20 SURGICAL SUPPLY — 67 items
BAG DECANTER FOR FLEXI CONT (MISCELLANEOUS) IMPLANT
CANISTER SUCT 3000ML PPV (MISCELLANEOUS) ×4 IMPLANT
CATH KIT ON Q 5IN SLV (PAIN MANAGEMENT) IMPLANT
CATH ROBINSON RED A/P 18FR (CATHETERS) ×4 IMPLANT
CATH ROBINSON RED A/P 22FR (CATHETERS) IMPLANT
CATH THORACIC 28FR (CATHETERS) IMPLANT
CATH THORACIC 36FR (CATHETERS) IMPLANT
CATH THORACIC 36FR RT ANG (CATHETERS) IMPLANT
CONT SPEC 4OZ CLIKSEAL STRL BL (MISCELLANEOUS) ×20 IMPLANT
DERMABOND ADVANCED (GAUZE/BANDAGES/DRESSINGS) ×2
DERMABOND ADVANCED .7 DNX12 (GAUZE/BANDAGES/DRESSINGS) ×2 IMPLANT
DRAIN CHANNEL 28F RND 3/8 FF (WOUND CARE) ×4 IMPLANT
DRAPE C-ARM 42X72 X-RAY (DRAPES) IMPLANT
DRAPE LAPAROSCOPIC ABDOMINAL (DRAPES) ×4 IMPLANT
DRAPE SLUSH/WARMER DISC (DRAPES) ×4 IMPLANT
DRSG AQUACEL AG ADV 3.5X 6 (GAUZE/BANDAGES/DRESSINGS) ×4 IMPLANT
ELECT BLADE 4.0 EZ CLEAN MEGAD (MISCELLANEOUS) ×4
ELECT BLADE 6.5 EXT (BLADE) ×4 IMPLANT
ELECT REM PT RETURN 9FT ADLT (ELECTROSURGICAL) ×4
ELECTRODE BLDE 4.0 EZ CLN MEGD (MISCELLANEOUS) ×2 IMPLANT
ELECTRODE REM PT RTRN 9FT ADLT (ELECTROSURGICAL) ×2 IMPLANT
GAUZE SPONGE 4X4 12PLY STRL (GAUZE/BANDAGES/DRESSINGS) ×4 IMPLANT
GLOVE BIO SURGEON STRL SZ7.5 (GLOVE) ×12 IMPLANT
GOWN STRL REUS W/ TWL LRG LVL3 (GOWN DISPOSABLE) ×10 IMPLANT
GOWN STRL REUS W/TWL LRG LVL3 (GOWN DISPOSABLE) ×10
KIT BASIN OR (CUSTOM PROCEDURE TRAY) ×4 IMPLANT
KIT PLEURX DRAIN CATH 1000ML (MISCELLANEOUS) ×4 IMPLANT
KIT PLEURX DRAIN CATH 15.5FR (DRAIN) ×4 IMPLANT
KIT TURNOVER KIT B (KITS) ×4 IMPLANT
NS IRRIG 1000ML POUR BTL (IV SOLUTION) ×8 IMPLANT
PACK CHEST (CUSTOM PROCEDURE TRAY) ×4 IMPLANT
PAD ARMBOARD 7.5X6 YLW CONV (MISCELLANEOUS) ×8 IMPLANT
SEALANT SURG COSEAL 4ML (VASCULAR PRODUCTS) IMPLANT
SET DRAINAGE LINE (MISCELLANEOUS) IMPLANT
SOLUTION ANTI FOG 6CC (MISCELLANEOUS) ×4 IMPLANT
SPONGE TONSIL TAPE 1 RFD (DISPOSABLE) ×4 IMPLANT
SUT CHROMIC 3 0 SH 27 (SUTURE) IMPLANT
SUT ETHILON 3 0 PS 1 (SUTURE) ×4 IMPLANT
SUT PROLENE 3 0 SH DA (SUTURE) IMPLANT
SUT PROLENE 4 0 RB 1 (SUTURE)
SUT PROLENE 4-0 RB1 .5 CRCL 36 (SUTURE) IMPLANT
SUT SILK  1 MH (SUTURE) ×2
SUT SILK 1 MH (SUTURE) ×2 IMPLANT
SUT SILK 2 0SH CR/8 30 (SUTURE) IMPLANT
SUT SILK 3 0SH CR/8 30 (SUTURE) IMPLANT
SUT VIC AB 1 CTX 18 (SUTURE) ×4 IMPLANT
SUT VIC AB 2 TP1 27 (SUTURE) IMPLANT
SUT VIC AB 2-0 CT2 18 VCP726D (SUTURE) IMPLANT
SUT VIC AB 2-0 CTX 36 (SUTURE) IMPLANT
SUT VIC AB 3-0 SH 18 (SUTURE) IMPLANT
SUT VIC AB 3-0 SH 8-18 (SUTURE) ×4 IMPLANT
SUT VIC AB 3-0 X1 27 (SUTURE) IMPLANT
SUT VICRYL 0 UR6 27IN ABS (SUTURE) IMPLANT
SUT VICRYL 2 TP 1 (SUTURE) ×4 IMPLANT
SWAB COLLECTION DEVICE MRSA (MISCELLANEOUS) IMPLANT
SWAB CULTURE ESWAB REG 1ML (MISCELLANEOUS) IMPLANT
SYR BULB IRRIGATION 50ML (SYRINGE) ×4 IMPLANT
SYSTEM SAHARA CHEST DRAIN ATS (WOUND CARE) ×4 IMPLANT
TAPE CLOTH SURG 4X10 WHT LF (GAUZE/BANDAGES/DRESSINGS) ×4 IMPLANT
TIP APPLICATOR SPRAY EXTEND 16 (VASCULAR PRODUCTS) IMPLANT
TOWEL GREEN STERILE FF (TOWEL DISPOSABLE) ×8 IMPLANT
TRAP SPECIMEN MUCOUS 40CC (MISCELLANEOUS) ×4 IMPLANT
TRAY FOLEY MTR SLVR 16FR STAT (SET/KITS/TRAYS/PACK) ×4 IMPLANT
TROCAR BLADELESS 5MM (ENDOMECHANICALS) IMPLANT
TUNNELER SHEATH ON-Q 11GX8 DSP (PAIN MANAGEMENT) IMPLANT
VALVE REPLACEMENT CAP (MISCELLANEOUS) IMPLANT
WATER STERILE IRR 1000ML POUR (IV SOLUTION) ×8 IMPLANT

## 2019-05-20 NOTE — Brief Op Note (Addendum)
05/14/2019 - 05/20/2019  9:40 AM  PATIENT:  Jeffrey [name]  85 y.o. male  PRE-OPERATIVE DIAGNOSIS:  RIGHT PLEURAL EFFUSION- Loculated  POST-OPERATIVE DIAGNOSIS:  RIGHT PLEURAL EFFUSION  PROCEDURE:   VIDEO ASSISTED THORACOSCOPY (Right) PLEURAL BIOPSY (Right) INSERTION PLEURX  DRAINAGE CATHETER (Right)  SURGEON:  Ivin Poot, MD - Primary  PHYSICIAN ASSISTANT: Rodden[place]  ANESTHESIA:   general  EBL:  100 mL   BLOOD ADMINISTERED:none  DRAINS: Right pleural tube, right PleurX catheter   LOCAL MEDICATIONS USED:  NONE  SPECIMEN:  Source of Specimen:  Right lung pleura, right parietal pleura, pleural fluid  DISPOSITION OF SPECIMEN:  PATHOLOGY  COUNTS:  YES  DICTATION: .Dragon Dictation  PLAN OF CARE: Admit to inpatient   PATIENT DISPOSITION:  PACU - hemodynamically stable.   Delay start of Pharmacological VTE agent (>24hrs) due to surgical blood loss or risk of bleeding: yes   1.0 L of serosanguinous fluid removed Frozen section of pleural biopsy- inflammation w/o malignancy Post Op cxr shows good re-expansion of R lung

## 2019-05-20 NOTE — Anesthesia Procedure Notes (Signed)
Arterial Line Insertion Start/End5/29/2020 6:35 AM, 05/20/2019 6:45 AM Performed by: Lavell Luster, CRNA, CRNA  Preanesthetic checklist: patient identified, IV checked, risks and benefits discussed, surgical consent, monitors and equipment checked, pre-op evaluation and timeout performed Lidocaine 1% used for infiltration Left, radial was placed Catheter size: 20 G Hand hygiene performed , maximum sterile barriers used  and Seldinger technique used Allen's test indicative of satisfactory collateral circulation Attempts: 1 Procedure performed without using ultrasound guided technique. Following insertion, Biopatch and dressing applied. Post procedure assessment: normal  Patient tolerated the procedure well with no immediate complications.

## 2019-05-20 NOTE — Anesthesia Procedure Notes (Signed)
Procedure Name: Intubation Date/Time: 05/20/2019 7:50 AM Performed by: Lavell Luster, CRNA Pre-anesthesia Checklist: Patient identified, Emergency Drugs available, Suction available, Patient being monitored and Timeout performed Patient Re-evaluated:Patient Re-evaluated prior to induction Oxygen Delivery Method: Circle system utilized Preoxygenation: Pre-oxygenation with 100% oxygen Induction Type: IV induction Laryngoscope Size: Mac and 4 Grade View: Grade II Endobronchial tube: Double lumen EBT and Left and 39 Fr Number of attempts: 1 Airway Equipment and Method: Stylet Placement Confirmation: ETT inserted through vocal cords under direct vision,  positive ETCO2 and breath sounds checked- equal and bilateral Tube secured with: Tape Dental Injury: Teeth and Oropharynx as per pre-operative assessment

## 2019-05-20 NOTE — Op Note (Signed)
NAMECOOPER, MORONEY MEDICAL RECORD ZO:[id number] ACCOUNT [id number] DATE OF BIRTH:03-28-1934 FACILITY: [facility] LOCATION: [facility] PHYSICIAN:Ellisa Devivo VAN TRIGT III, MD  OPERATIVE REPORT  DATE OF PROCEDURE:  05/20/2019  OPERATION: 1.  Right VATS (video-assisted thoracoscopic surgery) for drainage of loculated right pleural effusion. 2.  Right pleural biopsies. 3.  Talc pleurodesis of the right pleural space. 4.  Placement of right PleurX catheter.  SURGEON:  Len Childs, MD  ASSISTANT:  Enid Cutter, PA-C  ANESTHESIA:  General.  PREOPERATIVE DIAGNOSIS:  Large recurrent loculated right pleural effusion, not responsive to thoracentesis.  POSTOPERATIVE DIAGNOSIS:  Large recurrent loculated right pleural effusion, not responsive to thoracentesis.  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  He was admitted to the hospital for shortness of breath and a new large right pleural effusion.  Chest x-ray in 2019 was clear.  CT scan showed some  pleural plaques and a large pleural effusion with entrapment of the right lower lobe.  A thoracentesis procedure was performed returning 800 mL of serosanguineous fluid without improvement in the chest x-ray.  Thoracic surgical evaluation and treatment  was requested.  I reviewed the patient's CT scan, echocardiogram, and his medical record.  I examined the patient.  I recommended right VATS for drainage of the loculated right pleural effusion, large.  I also recommended biopsy of the pleural plaque  because of the concern over asbestosis and possible mesothelioma.  I discussed the procedure of right VATS with drainage of the loculated large effusion and placement of a PleurX catheter with the patient.  He understood the details of the surgery, the  risks and benefits and agreed to proceed under what I felt was informed consent.  OPERATIVE FINDINGS: 1.  Large loculated right pleural  effusion completely drained.  It was thin and serosanguineous, and fluid was sent for both cytology and culture. 2.  Biopsies of fairly low profile pleural plaque.  Frozen section showed scarring and inflammation without definite malignancy. 3.  Good reexpansion of the right lung after the procedure by a postop chest x-ray.  DESCRIPTION OF PROCEDURE:  The patient was brought from preop holding where the patient had been marked and informed consent documented and all final questions addressed.  The patient was placed supine on the operating table and general anesthesia was  induced.  The patient was intubated with a double-lumen endotracheal tube.  The patient was then turned right side up.  The right chest was prepped and draped as a sterile field.  A proper time-out was performed.  A small incision was made in the 5th  interspace anterior to the scapula.  The right lung was collapsed.  The pleural space was entered.  A liter of serosanguineous fluid was removed.  This was sent for cytology and culture.  The VATS camera was inserted.  There was no other specific  abnormality in the right lower lobe, although the visceral pleura was somewhat thickened from being exposed to longstanding pleural effusion.  There were plaques on the pleura.  These were biopsied with the bioptome and sent for permanent section.  One  piece of the plaque was sent for frozen section, which was negative for malignancy.  After the pleural space was drained of the loculated effusion completely, talc pleurodesis was performed over the pleural surface.  This was followed by placement of a temporary 28-French Bard chest tube catheter as well as a right implanted PleurX  catheter for treatment  of potential recurrent effusion.  The ribs were reapproximated with a single pericostal suture.  The muscle layers were closed in layers using interrupted #1 Vicryl.  The subcutaneous and skin layers were closed using running Vicryl.  The exit  of the PleurX catheter was secured to the  skin with a 2-0 silk.  The chest tubes were secured with a #1 silk suture and connected to a Pleur-evac drainage system.  The patient was turned, reversed from anesthesia, and extubated and returned to recovery room in stable condition.  LN/NUANCE  D:05/20/2019 T:05/20/2019 JOB:[id number]

## 2019-05-20 NOTE — Transfer of Care (Signed)
Immediate Anesthesia Transfer of Care Note  Patient: Jeffrey [name]  Procedure(s) Performed: VIDEO ASSISTED THORACOSCOPY (Right Chest) PLEURAL BIOPSY (Right ) INSERTION PLEURAL DRAINAGE CATHETER (Right Chest)  Patient Location: PACU  Anesthesia Type:General  Level of Consciousness: awake, alert  and sedated  Airway & Oxygen Therapy: Patient connected to face mask oxygen  Post-op Assessment: Post -op Vital signs reviewed and stable  Post vital signs: stable  Last Vitals:  Vitals Value Taken Time  BP 138/71 05/20/2019 10:09 AM  Temp    Pulse 56 05/20/2019 10:08 AM  Resp 16 05/20/2019 10:08 AM  SpO2 99 % 05/20/2019 10:08 AM  Vitals shown include unvalidated device data.  Last Pain:  Vitals:   05/19/19 2317  TempSrc:   PainSc: 0-No pain      Patients Stated Pain Goal: 1 (16/10/96 0454)  Complications: No apparent anesthesia complications

## 2019-05-20 NOTE — Anesthesia Postprocedure Evaluation (Signed)
Anesthesia Post Note  Patient: Jeffrey [name]  Procedure(s) Performed: VIDEO ASSISTED THORACOSCOPY (Right Chest) PLEURAL BIOPSY (Right ) INSERTION PLEURAL DRAINAGE CATHETER (Right Chest)     Patient location during evaluation: PACU Anesthesia Type: General Level of consciousness: awake and alert Pain management: pain level controlled Vital Signs Assessment: post-procedure vital signs reviewed and stable Respiratory status: spontaneous breathing, nonlabored ventilation, respiratory function stable and patient connected to nasal cannula oxygen Cardiovascular status: blood pressure returned to baseline and stable Postop Assessment: no apparent nausea or vomiting Anesthetic complications: no    Last Vitals:  Vitals:   05/20/19 1045 05/20/19 1055  BP:  (!) 142/70  Pulse: 62 60  Resp: 15 12  Temp:    SpO2: 92% 94%    Last Pain:  Vitals:   05/20/19 1055  TempSrc:   PainSc: [place] Teresha Hanks

## 2019-05-20 NOTE — Progress Notes (Signed)
Pre Procedure note for inpatients:   Jeffrey [name] has been scheduled for Procedure(s): VIDEO ASSISTED THORACOSCOPY (Right) PLEURAL BIOPSY (Right) Possible, INSERTION PLEURAL DRAINAGE CATHETER (Right) today. The various methods of treatment have been discussed with the patient. After consideration of the risks, benefits and treatment options the patient has consented to the planned procedure.   The patient has been seen and labs reviewed. There are no changes in the patient's condition to prevent proceeding with the planned procedure today.  Recent labs:  Lab Results  Component Value Date   WBC 11.0 (H) 05/20/2019   HGB 12.8 (L) 05/20/2019   HCT 38.3 (L) 05/20/2019   PLT 254 05/20/2019   GLUCOSE 102 (H) 05/20/2019   ALT 9 05/19/2019   AST 17 05/19/2019   NA 139 05/20/2019   K 3.9 05/20/2019   CL 105 05/20/2019   CREATININE 1.22 05/20/2019   BUN 15 05/20/2019   CO2 25 05/20/2019   TSH 2.874 05/19/2019   INR 1.1 05/19/2019    Len Childs, MD 05/20/2019 7:54 AM

## 2019-05-20 NOTE — Progress Notes (Signed)
Patient ID: Jeffrey [name], male   DOB: 1934-02-04, 85 y.o.   MRN: [id number]  TCTS Evening Rounds:   Hemodynamically stable   Urine output good  CT output low  CBC    Component Value Date/Time   WBC 11.0 (H) 05/20/2019 0421   RBC 4.06 (L) 05/20/2019 0421   HGB 12.8 (L) 05/20/2019 0421   HCT 38.3 (L) 05/20/2019 0421   PLT 254 05/20/2019 0421   MCV 94.3 05/20/2019 0421   MCH 31.5 05/20/2019 0421   MCHC 33.4 05/20/2019 0421   RDW 15.0 05/20/2019 0421   LYMPHSABS 3.3 05/20/2019 0421   MONOABS 1.1 (H) 05/20/2019 0421   EOSABS 0.1 05/20/2019 0421   BASOSABS 0.0 05/20/2019 0421     BMET    Component Value Date/Time   NA 139 05/20/2019 0421   K 3.9 05/20/2019 0421   CL 105 05/20/2019 0421   CO2 25 05/20/2019 0421   GLUCOSE 102 (H) 05/20/2019 0421   BUN 15 05/20/2019 0421   CREATININE 1.22 05/20/2019 0421   CREATININE 1.79 (H) 10/12/2014 1136   CALCIUM 9.1 05/20/2019 0421   GFRNONAA 55 (L) 05/20/2019 0421   GFRNONAA 36 (L) 10/12/2014 1136   GFRAA >60 05/20/2019 0421   GFRAA 41 (L) 10/12/2014 1136     A/P:  Stable postop course. Continue current plans

## 2019-05-20 NOTE — Progress Notes (Signed)
[name] Kitchen  PROGRESS NOTE    Jeffrey [name]  IRC:[id number] DOB: 01-14-1934 DOA: 05/14/2019 PCP: Jeffrey Rad, MD   Brief Narrative:   Jeffrey [name] an 85 y.o.malew CAD s/p CABG, ischemic CM, mild Aortic stenosis, Asthma (mild), OSA, Hypothyroidism, Parkinsons, Anxiety, remote hx of MRSE septic arthritis left knee s/p L knee ID/ synovectomy, L THA, Anemia/ ITP apparently presents with c/o cough with white->slight yellow sputum since January of this year. Pt notes dyspnea for the past 4 weeks. Worse over the past 3-4 day   Assessment & Plan:   Principal Problem:   Pleural effusion on right Active Problems:   CAD (coronary artery disease) of artery bypass graft   Hypothyroidism   PD (Parkinson's disease) (HCC)   Pancreatic lesion   Right pleural effusion - Sputum culture - Appreciate Pulmonary consult: s/p thoracentesis: concern for mesothelioma (if cytology negative the plan is to repeat thoro?) - has improved aeration in upper right lung - vanc and zosyn given x 1 in ER - echo: EF 55%, some impaired relaxation - pulm to consult CTS     - s/p VATS; studies pending  CAD s/p CABG, DES Lcx, DES LAD - Cont Aspirin 81mg  po qday, Losartan 50mg  po qday  Copd w/o exacerbation - Cont Breo 1puff qday and albuterol neb bid  H/o Rheumatoid arthritis - HOLD methotrexate - Cont Prednisone 10mg  po qday and Folic acid 1mg  po qday  Hypothyroidism - Cont Levothyroxine 228micrograms po qday  Anxiety - Xanax 0.25mg  po bid prn  Parkinson - Cont Sinemet IR 25/100mg  po tid  RLS - Cont Ropinirole 0.5mg  po qhs and Zanaflex 4mg  po qhs  Gerd - Cont Protonix  Pancreatic lesion - MRI as outpatient  Constipation - PRN miralax   DVT prophylaxis: SCD Code Status: FULL   Disposition Plan: TBD   Consultants:   Pulmonology  CTS  Procedures:   Thoracentesis  Right VATS  Antimicrobials:  . Ancef  2g q8h   Subjective: Now s/p VATS  Objective: Vitals:   05/20/19 1125 05/20/19 1200 05/20/19 1300 05/20/19 1400  BP: (!) 150/73 (!) 165/81 139/73 (!) 155/65  Pulse: (!) 56 63 (!) 56 (!) 54  Resp: 13 13 14 14   Temp:      TempSrc:      SpO2: 94% 99% 95% 98%  Weight:      Height:        Intake/Output Summary (Last 24 hours) at 05/20/2019 1438 Last data filed at 05/20/2019 1400 Gross per 24 hour  Intake 3229.55 ml  Output 2150 ml  Net 1079.55 ml   Filed Weights   05/18/19 0529 05/19/19 0544 05/20/19 0459  Weight: 98.1 kg 98.5 kg 98.2 kg    Examination:  General exam:84 y.o.maleAppears calm and comfortable  Respiratory system:clear anteriors, rigth Ct noted Cardiovascular system:S1 &S2 heard, RRR. No JVD, murmurs, rubs, gallops or clicks. No pedal edema. Gastrointestinal system:Abdomen is nondistended, soft and nontender. No organomegaly or masses felt. Normal bowel sounds heard. Central nervous system:somnolent Skin: No rashes, lesions or ulcers    Data Reviewed: I have personally reviewed following labs and imaging studies.  CBC: Recent Labs  Lab 05/14/19 1810 05/16/19 0618 05/17/19 0459 05/19/19 0409 05/19/19 1112 05/20/19 0421  WBC 10.1 9.3 10.2 10.6* 8.8 11.0*  NEUTROABS 8.1*  --   --  6.1  --  6.4  HGB 13.9 13.5 13.6 13.2 13.1 12.8*  HCT 42.4 40.9 41.6 39.4 39.3 38.3*  MCV 95.1 94.2 94.3 93.1 93.3 94.3  PLT 302 292 282 268 263 643   Basic Metabolic Panel: Recent Labs  Lab 05/16/19 0618 05/17/19 0459 05/19/19 0409 05/19/19 1112 05/20/19 0421  NA 138 138 139 140 139  K 3.9 3.8 3.8 3.7 3.9  CL 102 105 104 106 105  CO2 27 24 28 28 25   GLUCOSE 119* 101* 87 127* 102*  BUN 18 19 19 16 15   CREATININE 1.43* 1.22 1.24 1.47* 1.22  CALCIUM 9.1 9.2 9.1 9.0 9.1  MG  --   --  1.9  --  1.9  PHOS  --   --  3.3  --  3.5   GFR: Estimated Creatinine Clearance: 57.6 mL/min (by C-G formula based on SCr of 1.22 mg/dL). Liver Function Tests: Recent  Labs  Lab 05/16/19 1217 05/19/19 0409 05/19/19 1112 05/20/19 0421  AST  --   --  17  --   ALT  --   --  9  --   ALKPHOS  --   --  38  --   BILITOT  --   --  0.4  --   PROT 6.2*  --  5.9*  --   ALBUMIN  --  3.1* 3.0* 3.0*   No results for input(s): LIPASE, AMYLASE in the last 168 hours. No results for input(s): AMMONIA in the last 168 hours. Coagulation Profile: Recent Labs  Lab 05/14/19 1810 05/19/19 1112  INR 1.0 1.1   Cardiac Enzymes: Recent Labs  Lab 05/14/19 1810  TROPONINI <0.03   BNP (last 3 results) No results for input(s): PROBNP in the last 8760 hours. HbA1C: No results for input(s): HGBA1C in the last 72 hours. CBG: No results for input(s): GLUCAP in the last 168 hours. Lipid Profile: No results for input(s): CHOL, HDL, LDLCALC, TRIG, CHOLHDL, LDLDIRECT in the last 72 hours. Thyroid Function Tests: Recent Labs    05/19/19 0409  TSH 2.874   Anemia Panel: No results for input(s): VITAMINB12, FOLATE, FERRITIN, TIBC, IRON, RETICCTPCT in the last 72 hours. Sepsis Labs: Recent Labs  Lab 05/14/19 1841 05/14/19 2024  LATICACIDVEN 1.4 1.3    Recent Results (from the past 240 hour(s))  SARS Coronavirus 2 (CEPHEID - Performed in [facility] hospital lab), Hosp Order     Status: None   Collection Time: 05/14/19  6:40 PM  Result Value Ref Range Status   SARS Coronavirus 2 NEGATIVE NEGATIVE Final    Comment: (NOTE) If result is NEGATIVE SARS-CoV-2 target nucleic acids are NOT DETECTED. The SARS-CoV-2 RNA is generally detectable in upper and lower  respiratory specimens during the acute phase of infection. The lowest  concentration of SARS-CoV-2 viral copies this assay can detect is 250  copies / mL. A negative result does not preclude SARS-CoV-2 infection  and should not be used as the sole basis for treatment or other  patient management decisions.  A negative result may occur with  improper specimen collection / handling, submission of specimen other   than nasopharyngeal swab, presence of viral mutation(s) within the  areas targeted by this assay, and inadequate number of viral copies  (<250 copies / mL). A negative result must be combined with clinical  observations, patient history, and epidemiological information. If result is POSITIVE SARS-CoV-2 target nucleic acids are DETECTED. The SARS-CoV-2 RNA is generally detectable in upper and lower  respiratory specimens dur ing the acute phase of infection.  Positive  results are indicative of active infection with SARS-CoV-2.  Clinical  correlation with patient history and other diagnostic  information is  necessary to determine patient infection status.  Positive results do  not rule out bacterial infection or co-infection with other viruses. If result is PRESUMPTIVE POSTIVE SARS-CoV-2 nucleic acids MAY BE PRESENT.   A presumptive positive result was obtained on the submitted specimen  and confirmed on repeat testing.  While 2019 novel coronavirus  (SARS-CoV-2) nucleic acids may be present in the submitted sample  additional confirmatory testing may be necessary for epidemiological  and / or clinical management purposes  to differentiate between  SARS-CoV-2 and other Sarbecovirus currently known to infect humans.  If clinically indicated additional testing with an alternate test  methodology [phone]) is advised. The SARS-CoV-2 RNA is generally  detectable in upper and lower respiratory sp ecimens during the acute  phase of infection. The expected result is Negative. Fact Sheet for Patients:  StrictlyIdeas.no Fact Sheet for Healthcare Providers: BankingDealers.co.za This test is not yet approved or cleared by the [place] FDA and has been authorized for detection and/or diagnosis of SARS-CoV-2 by FDA under an Emergency Use Authorization (EUA).  This EUA will remain in effect (meaning this test can be used) for the duration of the  COVID-19 declaration under Section 564(b)(1) of the Act, 21 U.S.C. section 360bbb-3(b)(1), unless the authorization is terminated or revoked sooner. Performed at [facility], [address] [place], [place] [address]   Body fluid culture     Status: None   Collection Time: 05/16/19 12:48 PM  Result Value Ref Range Status   Specimen Description PLEURAL  Final   Special Requests NONE  Final   Gram Stain   Final    MODERATE WBC PRESENT,BOTH PMN AND MONONUCLEAR NO ORGANISMS SEEN    Culture   Final    NO GROWTH 3 DAYS Performed at [facility], [place] [address] [place], [place] [address]    Report Status 05/19/2019 FINAL  Final  Acid Fast Smear (AFB)     Status: None   Collection Time: 05/16/19 12:48 PM  Result Value Ref Range Status   AFB Specimen Processing Concentration  Final   Acid Fast Smear Negative  Final    Comment: (NOTE) Performed At: [facility] [place], [place] [id number] Rush Farmer MD GM:[id number]    Source (AFB) FLUID  Final    Comment: PLEURAL Performed at [facility], [place] [address] [place], [place] [address]   Fungus Culture With Stain     Status: None (Preliminary result)   Collection Time: 05/16/19 12:48 PM  Result Value Ref Range Status   Fungus Stain Final report  Final    Comment: (NOTE) Performed At: [facility] [place], [place] [id number] Rush Farmer MD VF:[id number]    Fungus (Mycology) Culture PENDING  Incomplete   Fungal Source FLUID  Final    Comment: PLEURAL Performed at [facility], [place] [address], [place], [place] [address]   Fungus Culture Result     Status: None   Collection Time: 05/16/19 12:48 PM  Result Value Ref Range Status   Result 1 Comment  Final    Comment: (NOTE) KOH/Calcofluor preparation:  no fungus observed. Performed At: [facility] [place], [place] [id number] Rush Farmer MD NA:[id number]   Surgical pcr screen      Status: None   Collection Time: 05/18/19  5:38 PM  Result Value Ref Range Status   MRSA, PCR NEGATIVE NEGATIVE Final   Staphylococcus aureus NEGATIVE NEGATIVE Final  Comment: (NOTE) The Xpert SA Assay (FDA approved for NASAL specimens in patients 25 years of age and older), is one component of a comprehensive surveillance program. It is not intended to diagnose infection nor to guide or monitor treatment. Performed at [facility], [place] [address], [place], [place] [address]   Expectorated sputum assessment w rflx to resp cult     Status: None   Collection Time: 05/18/19  7:25 PM  Result Value Ref Range Status   Specimen Description EXPECTORATED SPUTUM  Final   Special Requests Normal  Final   Sputum evaluation   Final    THIS SPECIMEN IS ACCEPTABLE FOR SPUTUM CULTURE Performed at [facility], [place] [address], [place], [place] [address]    Report Status 05/18/2019 FINAL  Final  Culture, respiratory     Status: None (Preliminary result)   Collection Time: 05/18/19  7:25 PM  Result Value Ref Range Status   Specimen Description EXPECTORATED SPUTUM  Final   Special Requests Normal Reflexed from [id number]  Final   Gram Stain   Final    RARE WBC PRESENT,BOTH PMN AND MONONUCLEAR FEW GRAM POSITIVE COCCI RARE GRAM NEGATIVE RODS    Culture   Final    CULTURE REINCUBATED FOR BETTER GROWTH Performed at [facility], [place] [address] [place], [place] [address]    Report Status PENDING  Incomplete  Aerobic Culture (superficial specimen)     Status: None (Preliminary result)   Collection Time: 05/20/19  8:39 AM  Result Value Ref Range Status   Specimen Description PLEURAL  Final   Special Requests RIGHT  Final   Gram Stain   Final    FEW WBC PRESENT, PREDOMINANTLY PMN NO ORGANISMS SEEN Performed at [facility], [place] [address], [place],  [address]    Culture PENDING  Incomplete   Report Status PENDING  Incomplete         Radiology Studies: Dg  Chest Portable 1 View  Result Date: 05/20/2019 CLINICAL DATA:  VATS. EXAM: PORTABLE CHEST 1 VIEW COMPARISON:  05/16/2019. FINDINGS: Endotracheal tube tip approximately 1.9 cm above the carina. Right chest tube noted medial right chest. Tiny right base pleural air collection noted. This is most likely related to fluid evacuation and follow-up exam is suggested. No prominent pneumothorax noted. Bibasilar atelectasis/infiltrates. Small left pleural effusion. Prior CABG. Stable cardiomegaly. IMPRESSION: 1.  Endotracheal tube tip approximately 1.9 cm above the carina. 2. Right chest tube noted over the medial right chest with evacuation of the previously identified right pleural effusion. Tiny collection of right base pleural air is noted, this is most likely related to fluid evacuation and a follow-up exam is suggested. No prominent pneumothorax noted. 3.  Bibasilar atelectasis/infiltrates.  Small left pleural effusion. These results will be called to the ordering clinician or representative by the Radiologist Assistant, and communication documented in the PACS or zVision Dashboard. Electronically Signed   By: Marcello Moores  Register   On: 05/20/2019 10:05   Vas [place] Lower Extremity Venous (dvt)  Result Date: 05/19/2019  Lower Venous Study Indications: Swelling, and Pre-op.  Performing Technologist: Maudry Mayhew MHA, RDMS, RVT, RDCS  Examination Guidelines: A complete evaluation includes B-mode imaging, spectral Doppler, color Doppler, and power Doppler as needed of all accessible portions of each vessel. Bilateral testing is considered an integral part of a complete examination. Limited examinations for reoccurring indications may be performed as noted.  +---------+---------------+---------+-----------+----------+--------------+ RIGHT    CompressibilityPhasicitySpontaneityPropertiesSummary        +---------+---------------+---------+-----------+----------+--------------+  CFV      Full           Yes       Yes                                 +---------+---------------+---------+-----------+----------+--------------+ SFJ      Full                                                        +---------+---------------+---------+-----------+----------+--------------+ FV Prox  Full                                                        +---------+---------------+---------+-----------+----------+--------------+ FV Mid   Full                                                        +---------+---------------+---------+-----------+----------+--------------+ FV DistalFull                                                        +---------+---------------+---------+-----------+----------+--------------+ PFV      Full                                                        +---------+---------------+---------+-----------+----------+--------------+ POP      Full           Yes      Yes                                 +---------+---------------+---------+-----------+----------+--------------+ PTV      Full                                                        +---------+---------------+---------+-----------+----------+--------------+ PERO                                                  Not visualized +---------+---------------+---------+-----------+----------+--------------+   +---------+---------------+---------+-----------+----------+-------+ LEFT     CompressibilityPhasicitySpontaneityPropertiesSummary +---------+---------------+---------+-----------+----------+-------+ CFV      Full           Yes      Yes                          +---------+---------------+---------+-----------+----------+-------+ SFJ  Full                                                 +---------+---------------+---------+-----------+----------+-------+ FV Prox  Full                                                  +---------+---------------+---------+-----------+----------+-------+ FV Mid   Full                                                 +---------+---------------+---------+-----------+----------+-------+ FV DistalFull                                                 +---------+---------------+---------+-----------+----------+-------+ PFV      Full                                                 +---------+---------------+---------+-----------+----------+-------+ POP      Full           Yes      Yes                          +---------+---------------+---------+-----------+----------+-------+ PTV      Full                                                 +---------+---------------+---------+-----------+----------+-------+ PERO     Full                                                 +---------+---------------+---------+-----------+----------+-------+   Summary: Right: There is no evidence of deep vein thrombosis in the lower extremity. However, portions of this examination were limited- see technologist comments above. No cystic structure found in the popliteal fossa. Left: There is no evidence of deep vein thrombosis in the lower extremity. No cystic structure found in the popliteal fossa.  *See table(s) above for measurements and observations.    Preliminary         Scheduled Meds: . acetaminophen  1,000 mg Oral Q6H   Or  . acetaminophen (TYLENOL) oral liquid 160 mg/5 mL  1,000 mg Oral Q6H  . albuterol  2.5 mg Nebulization Q4H while awake  . aspirin EC  81 mg Oral Daily  . bisacodyl  10 mg Oral Daily  . carbidopa-levodopa  1 tablet Oral TID  . fentaNYL   Intravenous Q4H  . fluticasone furoate-vilanterol  1 puff Inhalation Daily  . folic acid  1 mg Oral Daily  . ipratropium  0.5 mg Nebulization Q4H WA  . levothyroxine  200 mcg Oral QHS  . losartan  50 mg Oral QHS  . pantoprazole  40 mg Oral Daily  . predniSONE  10 mg Oral Q breakfast  . rOPINIRole  0.5 mg  Oral QHS  . senna-docusate  1 tablet Oral QHS  . tiZANidine  4 mg Oral QHS   Continuous Infusions: . sodium chloride 75 mL/hr at 05/20/19 1400  .  ceFAZolin (ANCEF) IV    . potassium chloride       LOS: 5 days    Time spent: 15 minutes spent in the coordination of care today.    Jonnie Finner, DO [facility] Pager [phone]  If 7PM-7AM, please contact night-coverage [email or web address] Password [facility] 05/20/2019, 2:38 PM

## 2019-05-20 NOTE — Progress Notes (Signed)
PT Cancellation Note  Patient Details Name: Jeffrey [name] MRN: [id number] DOB: 08/21/1934   Cancelled Treatment:    Reason Eval/Treat Not Completed: Patient at procedure or test/unavailable Will follow-up as time allows   Lanney Gins, PT, DPT Supplemental Physical Therapist 05/20/19 8:11 AM Pager: [phone] Office: [phone]

## 2019-05-21 ENCOUNTER — Encounter (HOSPITAL_COMMUNITY): Payer: Self-pay | Admitting: Cardiothoracic Surgery

## 2019-05-21 LAB — CULTURE, RESPIRATORY W GRAM STAIN
Culture: NORMAL
Special Requests: NORMAL

## 2019-05-21 LAB — BASIC METABOLIC PANEL
Anion gap: 11 (ref 5–15)
BUN: 16 mg/dL (ref 8–23)
CO2: 25 mmol/L (ref 22–32)
Calcium: 9.2 mg/dL (ref 8.9–10.3)
Chloride: 100 mmol/L (ref 98–111)
Creatinine, Ser: 1.23 mg/dL (ref 0.61–1.24)
GFR calc Af Amer: >60 mL/min (ref >60)
GFR calc non Af Amer: 54 mL/min — ABNORMAL LOW (ref >60)
Glucose, Bld: 121 mg/dL — ABNORMAL HIGH (ref 70–99)
Potassium: 4.7 mmol/L (ref 3.5–5.1)
Sodium: 136 mmol/L (ref 135–145)

## 2019-05-21 LAB — MAGNESIUM: Magnesium: 1.7 mg/dL (ref 1.7–2.4)

## 2019-05-21 LAB — POCT I-STAT 7, (LYTES, BLD GAS, ICA,H+H)
Acid-base deficit: 1 mmol/L (ref 0.0–2.0)
Bicarbonate: 24.3 mmol/L (ref 20.0–28.0)
Calcium, Ion: 1.25 mmol/L (ref 1.15–1.40)
HCT: 39 % (ref 39.0–52.0)
Hemoglobin: 13.3 g/dL (ref 13.0–17.0)
O2 Saturation: 96 %
Patient temperature: 98.1
Potassium: 4.2 mmol/L (ref 3.5–5.1)
Sodium: 137 mmol/L (ref 135–145)
TCO2: 26 mmol/L (ref 22–32)
pCO2 arterial: 41.4 mmHg (ref 32.0–48.0)
pH, Arterial: 7.376 (ref 7.350–7.450)
pO2, Arterial: 82 mmHg — ABNORMAL LOW (ref 83.0–108.0)

## 2019-05-21 LAB — CBC
HCT: 43.6 % (ref 39.0–52.0)
Hemoglobin: 14.2 g/dL (ref 13.0–17.0)
MCH: 31.1 pg (ref 26.0–34.0)
MCHC: 32.6 g/dL (ref 30.0–36.0)
MCV: 95.6 fL (ref 80.0–100.0)
Platelets: 266 10*3/uL (ref 150–400)
RBC: 4.56 MIL/uL (ref 4.22–5.81)
RDW: 15.1 % (ref 11.5–15.5)
WBC: 13.8 10*3/uL — ABNORMAL HIGH (ref 4.0–10.5)
nRBC: 0 % (ref 0.0–0.2)

## 2019-05-21 LAB — ACID FAST SMEAR (AFB, MYCOBACTERIA): Acid Fast Smear: NEGATIVE

## 2019-05-21 MED ORDER — IPRATROPIUM-ALBUTEROL 0.5-2.5 (3) MG/3ML IN SOLN
3.0000 mL | RESPIRATORY_TRACT | Status: DC | PRN
  Administered 2019-05-28: 3 mL via RESPIRATORY_TRACT
  Filled 2019-05-21: qty 3

## 2019-05-21 MED ORDER — METOCLOPRAMIDE HCL 5 MG/ML IJ SOLN
10.0000 mg | Freq: Four times a day (QID) | INTRAMUSCULAR | Status: AC
  Administered 2019-05-21 – 2019-05-22 (×4): 10 mg via INTRAVENOUS
  Filled 2019-05-21 (×4): qty 2

## 2019-05-21 NOTE — Progress Notes (Signed)
RT obtained ABG on pt with the following results.  RT will continue to monitor.     Ref. Range 05/21/2019 04:20  Sample type Unknown ARTERIAL  pH, Arterial Latest Ref Range: 7.350 - 7.450  7.376  pCO2 arterial Latest Ref Range: 32.0 - 48.0 mmHg 41.4  pO2, Arterial Latest Ref Range: 83.0 - 108.0 mmHg 82.0 (L)  TCO2 Latest Ref Range: 22 - 32 mmol/L 26  Acid-base deficit Latest Ref Range: 0.0 - 2.0 mmol/L 1.0  Bicarbonate Latest Ref Range: 20.0 - 28.0 mmol/L 24.3  O2 Saturation Latest Units: % 96.0  Patient temperature Unknown 98.1 F  Collection site Unknown BRACHIAL ARTERY

## 2019-05-21 NOTE — Progress Notes (Signed)
[name] Kitchen  PROGRESS NOTE    Jeffrey [name]  OZD:[id number] DOB: 08/14/1934 DOA: 05/14/2019 PCP: [name], MD   Brief Narrative:   Jeffrey [name] an 85 y.o.malew CAD s/p CABG, ischemic CM, mild Aortic stenosis, Asthma (mild), OSA, Hypothyroidism, Parkinsons, Anxiety, remote hx of MRSE septic arthritis left knee s/p L knee ID/ synovectomy, L THA, Anemia/ ITP apparently presents with c/o cough with white->slight yellow sputum since January of this year. Pt notes dyspnea for the past 4 weeks. Worse over the past 3-4 day   Assessment & Plan:   Principal Problem:   Pleural effusion on right Active Problems:   CAD (coronary artery disease) of artery bypass graft   Hypothyroidism   PD (Parkinson's disease) (HCC)   Pancreatic lesion   Right pleural effusion - Sputum culture - Appreciate Pulmonary consult: s/p thoracentesis: concern for mesothelioma (if cytology negative the plan is to repeat thoro?) - has improved aeration in upper right lung - vanc and zosyn given x 1 in ER - echo: EF 55%, some impaired relaxation - pulm to consult CTS - s/p VATS; studies pending  CAD s/p CABG, DES Lcx, DES LAD - Cont Aspirin 81mg  po qday, Losartan 50mg  po qday  Copd w/o exacerbation - Cont Breo 1puff qday and albuterol neb bid  H/o Rheumatoid arthritis - HOLD methotrexate - Cont Prednisone 10mg  po qday and Folic acid 1mg  po qday  Hypothyroidism - Cont Levothyroxine 231micrograms po qday  Anxiety - Xanax 0.25mg  po bid prn  Parkinson - Cont Sinemet IR 25/100mg  po tid  RLS - Cont Ropinirole 0.5mg  po qhs and Zanaflex 4mg  po qhs  Gerd - Cont Protonix  Pancreatic lesion - MRI as outpatient  Constipation - PRN miralax  C/o nausea this morning and being tired of sitting up. Studies pending on pleural fluid. Doing ok otherwise, continue as above.   DVT prophylaxis: SCDs Code Status: FULL    Disposition Plan: TBD   Consultants:   PCCM  CTS  Procedures:   Thoracentesis  Right VATS   Subjective: "I'm tired of sitting up."  Objective: Vitals:   05/21/19 1212 05/21/19 1300 05/21/19 1400 05/21/19 1500  BP:  116/66 111/63 123/64  Pulse:  70 66 71  Resp:  16 14 15   Temp: (!) 97.5 F (36.4 C)     TempSrc: Oral     SpO2:  95% 96% 96%  Weight:      Height:        Intake/Output Summary (Last 24 hours) at 05/21/2019 1525 Last data filed at 05/21/2019 1500 Gross per 24 hour  Intake 2398.74 ml  Output 970 ml  Net 1428.74 ml   Filed Weights   05/19/19 0544 05/20/19 0459 05/21/19 0600  Weight: 98.5 kg 98.2 kg 99.1 kg    Examination:  General exam:84 y.o.maleAppears calm and comfortable  Respiratory system:clear anteriors, rigth Ct noted Cardiovascular system:S1 &S2 heard, RRR. No JVD, murmurs, rubs, gallops or clicks. No pedal edema. Gastrointestinal system:Abdomen is nondistended, soft and nontender. No organomegaly or masses felt. Normal bowel sounds heard. Central nervous system:alert and oriented, no focal, follows comands Skin: No rashes, lesions or ulcers    Data Reviewed: I have personally reviewed following labs and imaging studies.  CBC: Recent Labs  Lab 05/14/19 1810  05/17/19 0459 05/19/19 0409 05/19/19 1112 05/20/19 0421 05/21/19 0420 05/21/19 0559  WBC 10.1   < > 10.2 10.6* 8.8 11.0*  --  13.8*  NEUTROABS 8.1*  --   --  6.1  --  6.4  --   --   HGB 13.9   < > 13.6 13.2 13.1 12.8* 13.3 14.2  HCT 42.4   < > 41.6 39.4 39.3 38.3* 39.0 43.6  MCV 95.1   < > 94.3 93.1 93.3 94.3  --  95.6  PLT 302   < > 282 268 263 254  --  266   < > = values in this interval not displayed.   Basic Metabolic Panel: Recent Labs  Lab 05/17/19 0459 05/19/19 0409 05/19/19 1112 05/20/19 0421 05/21/19 0420 05/21/19 0559  NA 138 139 140 139 137 136  K 3.8 3.8 3.7 3.9 4.2 4.7  CL 105 104 106 105  --  100  CO2 24 28 28 25   --  25  GLUCOSE 101*  87 127* 102*  --  121*  BUN 19 19 16 15   --  16  CREATININE 1.22 1.24 1.47* 1.22  --  1.23  CALCIUM 9.2 9.1 9.0 9.1  --  9.2  MG  --  1.9  --  1.9  --  1.7  PHOS  --  3.3  --  3.5  --   --    GFR: Estimated Creatinine Clearance: 57.4 mL/min (by C-G formula based on SCr of 1.23 mg/dL). Liver Function Tests: Recent Labs  Lab 05/16/19 1217 05/19/19 0409 05/19/19 1112 05/20/19 0421  AST  --   --  17  --   ALT  --   --  9  --   ALKPHOS  --   --  38  --   BILITOT  --   --  0.4  --   PROT 6.2*  --  5.9*  --   ALBUMIN  --  3.1* 3.0* 3.0*   No results for input(s): LIPASE, AMYLASE in the last 168 hours. No results for input(s): AMMONIA in the last 168 hours. Coagulation Profile: Recent Labs  Lab 05/14/19 1810 05/19/19 1112  INR 1.0 1.1   Cardiac Enzymes: Recent Labs  Lab 05/14/19 1810  TROPONINI <0.03   BNP (last 3 results) No results for input(s): PROBNP in the last 8760 hours. HbA1C: No results for input(s): HGBA1C in the last 72 hours. CBG: No results for input(s): GLUCAP in the last 168 hours. Lipid Profile: No results for input(s): CHOL, HDL, LDLCALC, TRIG, CHOLHDL, LDLDIRECT in the last 72 hours. Thyroid Function Tests: Recent Labs    05/19/19 0409  TSH 2.874   Anemia Panel: No results for input(s): VITAMINB12, FOLATE, FERRITIN, TIBC, IRON, RETICCTPCT in the last 72 hours. Sepsis Labs: Recent Labs  Lab 05/14/19 1841 05/14/19 2024  LATICACIDVEN 1.4 1.3    Recent Results (from the past 240 hour(s))  SARS Coronavirus 2 (CEPHEID - Performed in [facility] hospital lab), Hosp Order     Status: None   Collection Time: 05/14/19  6:40 PM  Result Value Ref Range Status   SARS Coronavirus 2 NEGATIVE NEGATIVE Final    Comment: (NOTE) If result is NEGATIVE SARS-CoV-2 target nucleic acids are NOT DETECTED. The SARS-CoV-2 RNA is generally detectable in upper and lower  respiratory specimens during the acute phase of infection. The lowest  concentration of  SARS-CoV-2 viral copies this assay can detect is 250  copies / mL. A negative result does not preclude SARS-CoV-2 infection  and should not be used as the sole basis for treatment or other  patient management decisions.  A negative result may occur with  improper specimen collection / handling, submission of specimen other  than nasopharyngeal swab, presence of viral mutation(s) within the  areas targeted by this assay, and inadequate number of viral copies  (<250 copies / mL). A negative result must be combined with clinical  observations, patient history, and epidemiological information. If result is POSITIVE SARS-CoV-2 target nucleic acids are DETECTED. The SARS-CoV-2 RNA is generally detectable in upper and lower  respiratory specimens dur ing the acute phase of infection.  Positive  results are indicative of active infection with SARS-CoV-2.  Clinical  correlation with patient history and other diagnostic information is  necessary to determine patient infection status.  Positive results do  not rule out bacterial infection or co-infection with other viruses. If result is PRESUMPTIVE POSTIVE SARS-CoV-2 nucleic acids MAY BE PRESENT.   A presumptive positive result was obtained on the submitted specimen  and confirmed on repeat testing.  While 2019 novel coronavirus  (SARS-CoV-2) nucleic acids may be present in the submitted sample  additional confirmatory testing may be necessary for epidemiological  and / or clinical management purposes  to differentiate between  SARS-CoV-2 and other Sarbecovirus currently known to infect humans.  If clinically indicated additional testing with an alternate test  methodology [phone]) is advised. The SARS-CoV-2 RNA is generally  detectable in upper and lower respiratory sp ecimens during the acute  phase of infection. The expected result is Negative. Fact Sheet for Patients:  StrictlyIdeas.no Fact Sheet for Healthcare  Providers: BankingDealers.co.za This test is not yet approved or cleared by the [place] FDA and has been authorized for detection and/or diagnosis of SARS-CoV-2 by FDA under an Emergency Use Authorization (EUA).  This EUA will remain in effect (meaning this test can be used) for the duration of the COVID-19 declaration under Section 564(b)(1) of the Act, 21 U.S.C. section 360bbb-3(b)(1), unless the authorization is terminated or revoked sooner. Performed at [facility], [address] [place], [place] [address]   Body fluid culture     Status: None   Collection Time: 05/16/19 12:48 PM  Result Value Ref Range Status   Specimen Description PLEURAL  Final   Special Requests NONE  Final   Gram Stain   Final    MODERATE WBC PRESENT,BOTH PMN AND MONONUCLEAR NO ORGANISMS SEEN    Culture   Final    NO GROWTH 3 DAYS Performed at [facility], [place] [address], [place], [place] [address]    Report Status 05/19/2019 FINAL  Final  Acid Fast Smear (AFB)     Status: None   Collection Time: 05/16/19 12:48 PM  Result Value Ref Range Status   AFB Specimen Processing Concentration  Final   Acid Fast Smear Negative  Final    Comment: (NOTE) Performed At: [facility] [place], [place] [id number] Rush Farmer MD PJ:[id number]    Source (AFB) FLUID  Final    Comment: PLEURAL Performed at [facility], [place] [address] [place], [place] [address]   Fungus Culture With Stain     Status: None (Preliminary result)   Collection Time: 05/16/19 12:48 PM  Result Value Ref Range Status   Fungus Stain Final report  Final    Comment: (NOTE) Performed At: [facility] [place], [place] [id number] Rush Farmer MD ZH:[id number]    Fungus (Mycology) Culture PENDING  Incomplete   Fungal Source FLUID  Final    Comment: PLEURAL Performed at [facility], [place] [address], [place], [place] [address]   Fungus Culture  Result  Status: None   Collection Time: 05/16/19 12:48 PM  Result Value Ref Range Status   Result 1 Comment  Final    Comment: (NOTE) KOH/Calcofluor preparation:  no fungus observed. Performed At: [facility] [place], [place] [id number] Rush Farmer MD YI:[id number]   Surgical pcr screen     Status: None   Collection Time: 05/18/19  5:38 PM  Result Value Ref Range Status   MRSA, PCR NEGATIVE NEGATIVE Final   Staphylococcus aureus NEGATIVE NEGATIVE Final    Comment: (NOTE) The Xpert SA Assay (FDA approved for NASAL specimens in patients 74 years of age and older), is one component of a comprehensive surveillance program. It is not intended to diagnose infection nor to guide or monitor treatment. Performed at [facility], [place] [address] [place], [place] [address]   Expectorated sputum assessment w rflx to resp cult     Status: None   Collection Time: 05/18/19  7:25 PM  Result Value Ref Range Status   Specimen Description EXPECTORATED SPUTUM  Final   Special Requests Normal  Final   Sputum evaluation   Final    THIS SPECIMEN IS ACCEPTABLE FOR SPUTUM CULTURE Performed at [facility], [place] [address], [place], [place] [address]    Report Status 05/18/2019 FINAL  Final  Culture, respiratory     Status: None   Collection Time: 05/18/19  7:25 PM  Result Value Ref Range Status   Specimen Description EXPECTORATED SPUTUM  Final   Special Requests Normal Reflexed from [id number]  Final   Gram Stain   Final    RARE WBC PRESENT,BOTH PMN AND MONONUCLEAR FEW GRAM POSITIVE COCCI RARE GRAM NEGATIVE RODS    Culture   Final    FEW Consistent with normal respiratory flora. Performed at [facility], [place] [address] [place], [place] [address]    Report Status 05/21/2019 FINAL  Final  Acid Fast Smear (AFB)     Status: None   Collection Time: 05/20/19  8:35 AM  Result Value Ref Range Status   AFB Specimen Processing Concentration  Final   Acid  Fast Smear Negative  Final    Comment: (NOTE) Performed At: [facility] [place], [place] [id number] Rush Farmer MD HE:[id number]    Source (AFB) PLEURAL  Final    Comment: Performed at [facility], [place] [address], [place], [place] [address]  Aerobic Culture (superficial specimen)     Status: None (Preliminary result)   Collection Time: 05/20/19  8:39 AM  Result Value Ref Range Status   Specimen Description PLEURAL  Final   Special Requests RIGHT  Final   Gram Stain   Final    FEW WBC PRESENT, PREDOMINANTLY PMN NO ORGANISMS SEEN    Culture   Final    NO GROWTH 1 DAY Performed at [facility], [place] [address] [place],  [address]    Report Status PENDING  Incomplete         Radiology Studies: Dg Chest Portable 1 View  Result Date: 05/20/2019 CLINICAL DATA:  VATS. EXAM: PORTABLE CHEST 1 VIEW COMPARISON:  05/16/2019. FINDINGS: Endotracheal tube tip approximately 1.9 cm above the carina. Right chest tube noted medial right chest. Tiny right base pleural air collection noted. This is most likely related to fluid evacuation and follow-up exam is suggested. No prominent pneumothorax noted. Bibasilar atelectasis/infiltrates. Small left pleural effusion. Prior CABG. Stable cardiomegaly. IMPRESSION: 1.  Endotracheal tube tip approximately  1.9 cm above the carina. 2. Right chest tube noted over the medial right chest with evacuation of the previously identified right pleural effusion. Tiny collection of right base pleural air is noted, this is most likely related to fluid evacuation and a follow-up exam is suggested. No prominent pneumothorax noted. 3.  Bibasilar atelectasis/infiltrates.  Small left pleural effusion. These results will be called to the ordering clinician or representative by the Radiologist Assistant, and communication documented in the PACS or zVision Dashboard. Electronically Signed   By: Marcello Moores  Register   On: 05/20/2019 10:05    Vas [place] Lower Extremity Venous (dvt)  Result Date: 05/20/2019  Lower Venous Study Indications: Swelling, and Pre-op.  Performing Technologist: Maudry Mayhew MHA, RDMS, RVT, RDCS  Examination Guidelines: A complete evaluation includes B-mode imaging, spectral Doppler, color Doppler, and power Doppler as needed of all accessible portions of each vessel. Bilateral testing is considered an integral part of a complete examination. Limited examinations for reoccurring indications may be performed as noted.  +---------+---------------+---------+-----------+----------+--------------+  RIGHT     Compressibility Phasicity Spontaneity Properties Summary         +---------+---------------+---------+-----------+----------+--------------+  CFV       Full            Yes       Yes                                    +---------+---------------+---------+-----------+----------+--------------+  SFJ       Full                                                             +---------+---------------+---------+-----------+----------+--------------+  FV Prox   Full                                                             +---------+---------------+---------+-----------+----------+--------------+  FV Mid    Full                                                             +---------+---------------+---------+-----------+----------+--------------+  FV Distal Full                                                             +---------+---------------+---------+-----------+----------+--------------+  PFV       Full                                                             +---------+---------------+---------+-----------+----------+--------------+  POP       Full            Yes       Yes                                    +---------+---------------+---------+-----------+----------+--------------+  PTV       Full                                                              +---------+---------------+---------+-----------+----------+--------------+  PERO                                                       Not visualized  +---------+---------------+---------+-----------+----------+--------------+   +---------+---------------+---------+-----------+----------+-------+  LEFT      Compressibility Phasicity Spontaneity Properties Summary  +---------+---------------+---------+-----------+----------+-------+  CFV       Full            Yes       Yes                             +---------+---------------+---------+-----------+----------+-------+  SFJ       Full                                                      +---------+---------------+---------+-----------+----------+-------+  FV Prox   Full                                                      +---------+---------------+---------+-----------+----------+-------+  FV Mid    Full                                                      +---------+---------------+---------+-----------+----------+-------+  FV Distal Full                                                      +---------+---------------+---------+-----------+----------+-------+  PFV       Full                                                      +---------+---------------+---------+-----------+----------+-------+  POP       Full            Yes  Yes                             +---------+---------------+---------+-----------+----------+-------+  PTV       Full                                                      +---------+---------------+---------+-----------+----------+-------+  PERO      Full                                                      +---------+---------------+---------+-----------+----------+-------+   Summary: Right: There is no evidence of deep vein thrombosis in the lower extremity. However, portions of this examination were limited- see technologist comments above. No cystic structure found in the popliteal fossa. Left: There is no evidence of deep vein  thrombosis in the lower extremity. No cystic structure found in the popliteal fossa.  *See table(s) above for measurements and observations. Electronically signed by Monica Martinez MD on 05/20/2019 at 5:22:14 PM.    Final         Scheduled Meds:  acetaminophen  1,000 mg Oral Q6H   Or   acetaminophen (TYLENOL) oral liquid 160 mg/5 mL  1,000 mg Oral Q6H   aspirin EC  81 mg Oral Daily   bisacodyl  10 mg Oral Daily   carbidopa-levodopa  1 tablet Oral TID   fentaNYL   Intravenous Q4H   fluticasone furoate-vilanterol  1 puff Inhalation Daily   folic acid  1 mg Oral Daily   levothyroxine  200 mcg Oral QHS   losartan  50 mg Oral QHS   metoCLOPramide (REGLAN) injection  10 mg Intravenous Q6H   pantoprazole  40 mg Oral Daily   predniSONE  10 mg Oral Q breakfast   rOPINIRole  0.5 mg Oral QHS   senna-docusate  1 tablet Oral QHS   tiZANidine  4 mg Oral QHS   Continuous Infusions:  sodium chloride 75 mL/hr at 05/21/19 1500   potassium chloride       LOS: 6 days    Time spent: 25 minutes spent in the coordination of care today.     Jonnie Finner, DO [facility] Pager [phone]  If 7PM-7AM, please contact night-coverage [email or web address] Password [email or web address] 05/21/2019, 3:25 PM

## 2019-05-21 NOTE — Progress Notes (Signed)
1 Day Post-Op Procedure(s) (LRB): VIDEO ASSISTED THORACOSCOPY (Right) PLEURAL BIOPSY (Right) INSERTION PLEURAL DRAINAGE CATHETER (Right) Subjective: Complains of nausea and not sleeping. Sitting up in chair.  Objective: Vital signs in last 24 hours: Temp:  [97 F (36.1 C)-98.4 F (36.9 C)] 98.2 F (36.8 C) (05/30 0815) Pulse Rate:  [54-83] 73 (05/30 0800) Cardiac Rhythm: Normal sinus rhythm (05/30 0800) Resp:  [11-19] 19 (05/30 0800) BP: (104-167)/(58-104) 104/66 (05/30 0800) SpO2:  [92 %-99 %] 93 % (05/30 0800) Arterial Line BP: (96-167)/(57-86) 167/69 (05/29 1600) Weight:  [99.1 kg] 99.1 kg (05/30 0600)  Hemodynamic parameters for last 24 hours:    Intake/Output from previous day: 05/29 0701 - 05/30 0700 In: 4220.9 [I.V.:4020.9; IV Piggyback:200] Out: 2410 [Urine:950; Blood:100; Chest Tube:360] Intake/Output this shift: Total I/O In: -  Out: 60 [Urine:60]  General appearance: looks miserable Neurologic: intact Heart: regular rate and rhythm, S1, S2 normal Lungs: clear to auscultation bilaterally Abdomen: soft, non-tender; bowel sounds hypoactive Extremities: extremities normal, atraumatic, no cyanosis or edema Wound: dressing dry Chest tube output serous with no air leak.  Lab Results: Recent Labs    05/20/19 0421 05/21/19 0420 05/21/19 0559  WBC 11.0*  --  13.8*  HGB 12.8* 13.3 14.2  HCT 38.3* 39.0 43.6  PLT 254  --  266   BMET:  Recent Labs    05/20/19 0421 05/21/19 0420 05/21/19 0559  NA 139 137 136  K 3.9 4.2 4.7  CL 105  --  100  CO2 25  --  25  GLUCOSE 102*  --  121*  BUN 15  --  16  CREATININE 1.22  --  1.23  CALCIUM 9.1  --  9.2    PT/INR:  Recent Labs    05/19/19 1112  LABPROT 13.6  INR 1.1   ABG    Component Value Date/Time   PHART 7.376 05/21/2019 0420   HCO3 24.3 05/21/2019 0420   TCO2 26 05/21/2019 0420   ACIDBASEDEF 1.0 05/21/2019 0420   O2SAT 96.0 05/21/2019 0420   CBG (last 3)  No results for input(s): GLUCAP in  the last 72 hours.  Assessment/Plan: S/P Procedure(s) (LRB): VIDEO ASSISTED THORACOSCOPY (Right) PLEURAL BIOPSY (Right) INSERTION PLEURAL DRAINAGE CATHETER (Right)  POD 1 Hemodynamically stable in sinus rhythm  Continue chest tube to suction.  Nausea: may be due to anesthesia, pain med. He is on Fentanyl PCA. Will add Reglan to Zofran.   IS, mobilize.   LOS: 6 days    Gaye Pollack 05/21/2019

## 2019-05-21 NOTE — Progress Notes (Signed)
Patient ID: Jeffrey [name], male   DOB: [birth year], 85 y.o.   MRN: [id number] TCTS Evening Rounds:  Hemodynamically stable sats 97% on [facility] Urine output ok CT output low.

## 2019-05-22 ENCOUNTER — Inpatient Hospital Stay (HOSPITAL_COMMUNITY): Payer: Medicare Other

## 2019-05-22 LAB — COMPREHENSIVE METABOLIC PANEL
ALT: 6 U/L (ref 0–44)
AST: 16 U/L (ref 15–41)
Albumin: 2.6 g/dL — ABNORMAL LOW (ref 3.5–5.0)
Alkaline Phosphatase: 38 U/L (ref 38–126)
Anion gap: 7 (ref 5–15)
BUN: 14 mg/dL (ref 8–23)
CO2: 26 mmol/L (ref 22–32)
Calcium: 8.8 mg/dL — ABNORMAL LOW (ref 8.9–10.3)
Chloride: 103 mmol/L (ref 98–111)
Creatinine, Ser: 1.11 mg/dL (ref 0.61–1.24)
GFR calc Af Amer: >60 mL/min (ref >60)
GFR calc non Af Amer: >60 mL/min (ref >60)
Glucose, Bld: 107 mg/dL — ABNORMAL HIGH (ref 70–99)
Potassium: 4.3 mmol/L (ref 3.5–5.1)
Sodium: 136 mmol/L (ref 135–145)
Total Bilirubin: 1 mg/dL (ref 0.3–1.2)
Total Protein: 5.6 g/dL — ABNORMAL LOW (ref 6.5–8.1)

## 2019-05-22 LAB — CBC WITH DIFFERENTIAL/PLATELET
Abs Immature Granulocytes: 0.06 10*3/uL (ref 0.00–0.07)
Basophils Absolute: 0 10*3/uL (ref 0.0–0.1)
Basophils Relative: 0 %
Eosinophils Absolute: 0.1 10*3/uL (ref 0.0–0.5)
Eosinophils Relative: 1 %
HCT: 39.9 % (ref 39.0–52.0)
Hemoglobin: 12.9 g/dL — ABNORMAL LOW (ref 13.0–17.0)
Immature Granulocytes: 0 %
Lymphocytes Relative: 14 %
Lymphs Abs: 1.9 10*3/uL (ref 0.7–4.0)
MCH: 30.7 pg (ref 26.0–34.0)
MCHC: 32.3 g/dL (ref 30.0–36.0)
MCV: 95 fL (ref 80.0–100.0)
Monocytes Absolute: 1.9 10*3/uL — ABNORMAL HIGH (ref 0.1–1.0)
Monocytes Relative: 14 %
Neutro Abs: 10.2 10*3/uL — ABNORMAL HIGH (ref 1.7–7.7)
Neutrophils Relative %: 71 %
Platelets: 227 10*3/uL (ref 150–400)
RBC: 4.2 MIL/uL — ABNORMAL LOW (ref 4.22–5.81)
RDW: 14.9 % (ref 11.5–15.5)
WBC: 14.3 10*3/uL — ABNORMAL HIGH (ref 4.0–10.5)
nRBC: 0 % (ref 0.0–0.2)

## 2019-05-22 LAB — AEROBIC CULTURE W GRAM STAIN (SUPERFICIAL SPECIMEN): Culture: NO GROWTH

## 2019-05-22 LAB — MAGNESIUM: Magnesium: 1.7 mg/dL (ref 1.7–2.4)

## 2019-05-22 MED ORDER — CHLORHEXIDINE GLUCONATE CLOTH 2 % EX PADS
6.0000 | MEDICATED_PAD | Freq: Every day | CUTANEOUS | Status: DC
  Administered 2019-05-22 – 2019-05-28 (×7): 6 via TOPICAL

## 2019-05-22 NOTE — Progress Notes (Signed)
Patient ID: Jeffrey [name], male   DOB: 10-09-1934, 85 y.o.   MRN: [id number] TCTS Evening Rounds:  Hemodynamically stable in sinus rhythm. sats 94% on 2L [place].  Chest tube output 110 cc so far today.

## 2019-05-22 NOTE — Progress Notes (Signed)
Dr Cyndia Bent rounded and placed chest tube to water seal.

## 2019-05-22 NOTE — Progress Notes (Signed)
Jeffrey [name]  ION:[id number] DOB: 07/05/1934 DOA: 05/14/2019 PCP: [name], MD   Brief Narrative:   Jeffrey Littler Pruittis an 85 y.o.malew CAD s/p CABG, ischemic CM, mild Aortic stenosis, Asthma (mild), OSA, Hypothyroidism, Parkinsons, Anxiety, remote hx of MRSE septic arthritis left knee s/p L knee ID/ synovectomy, L THA, Anemia/ ITP apparently presents with c/o cough with white->slight yellow sputum since January of this year. Pt notes dyspnea for the past 4 weeks. Worse over the past 3-4 day   Assessment & Plan:   Principal Problem:   Pleural effusion on right Active Problems:   CAD (coronary artery disease) of artery bypass graft   Hypothyroidism   PD (Parkinson's disease) (HCC)   Pancreatic lesion   Right pleural effusion - Sputum culture - Appreciate Pulmonary consult: s/p thoracentesis: concern for mesothelioma (if cytology negative the plan is to repeat thoro?) - has improved aeration in upper right lung - vanc and zosyn given x 1 in ER - echo: EF 55%, some impaired relaxation - pulm to consult CTS -s/pVATS; studies thus far negative  CAD s/p CABG, DES Lcx, DES LAD - Cont Aspirin 81mg  po qday,Losartan 50mg  po qday     - BP is a little soft; place hold parameters on losartan  Copd w/o exacerbation - Cont Breo 1puff qday and albuterol neb bid  H/o Rheumatoid arthritis - HOLD methotrexate - Cont Prednisone 10mg  po qday and Folic acid 1mg  po qday  Hypothyroidism - Cont Levothyroxine 217micrograms po qday  Anxiety - Xanax 0.25mg  po bid prn  Parkinson - Cont Sinemet IR 25/100mg  po tid  RLS - Cont Ropinirole 0.5mg  po qhs and Zanaflex 4mg  po qhs  Gerd - Cont Protonix  Pancreatic lesion - MRI as outpatient  Constipation - PRN miralax  Says he is feeling much better today. Still tired. Possible removal of CT tomorrow. Continue as above otherwise.    DVT prophylaxis: SCDs Code Status: FULL   Disposition Plan: TBD   Consultants:   PCCM  CTS  Procedures:   Thoracentesis  Right VATS   Subjective: "They might take it out tomorrow."  Objective: Vitals:   05/22/19 0900 05/22/19 1000 05/22/19 1200 05/22/19 1300  BP: 107/60 128/76 (!) 142/80 (!) 97/48  Pulse: 82 87 91 81  Resp: 18 17 11 20   Temp:   98.3 F (36.8 C)   TempSrc:   Oral   SpO2: 93% 95% 93% 90%  Weight:      Height:        Intake/Output Summary (Last 24 hours) at 05/22/2019 1428 Last data filed at 05/22/2019 1300 Gross per 24 hour  Intake 1695.61 ml  Output 1740 ml  Net -44.39 ml   Filed Weights   05/20/19 0459 05/21/19 0600 05/22/19 0500  Weight: 98.2 kg 99.1 kg 98.9 kg    Examination:  General exam:84 y.o.maleAppears calm and comfortable  Respiratory system:clear anteriors, rigth Ct noted Cardiovascular system:S1 &S2 heard, RRR. No JVD, murmurs, rubs, gallops or clicks. No pedal edema. Gastrointestinal system:Abdomen is nondistended, soft and nontender. No organomegaly or masses felt. Normal bowel sounds heard. Central nervous system:alert and oriented, no focal, follows comands Skin: No rashes, lesions or ulcers    Data Reviewed: I have personally reviewed following labs and imaging studies.  CBC: Recent Labs  Lab 05/19/19 0409 05/19/19 1112 05/20/19 0421 05/21/19 0420 05/21/19 0559 05/22/19 0230  WBC 10.6* 8.8 11.0*  --  13.8* 14.3*  NEUTROABS 6.1  --  6.4  --   --  10.2*  HGB 13.2 13.1 12.8* 13.3 14.2 12.9*  HCT 39.4 39.3 38.3* 39.0 43.6 39.9  MCV 93.1 93.3 94.3  --  95.6 95.0  PLT 268 263 254  --  266 354   Basic Metabolic Panel: Recent Labs  Lab 05/19/19 0409 05/19/19 1112 05/20/19 0421 05/21/19 0420 05/21/19 0559 05/22/19 0230  NA 139 140 139 137 136 136  K 3.8 3.7 3.9 4.2 4.7 4.3  CL 104 106 105  --  100 103  CO2 28 28 25   --  25 26  GLUCOSE 87 127* 102*  --  121* 107*  BUN 19 16 15   --  16 14   CREATININE 1.24 1.47* 1.22  --  1.23 1.11  CALCIUM 9.1 9.0 9.1  --  9.2 8.8*  MG 1.9  --  1.9  --  1.7 1.7  PHOS 3.3  --  3.5  --   --   --    GFR: Estimated Creatinine Clearance: 63.5 mL/min (by C-G formula based on SCr of 1.11 mg/dL). Liver Function Tests: Recent Labs  Lab 05/16/19 1217 05/19/19 0409 05/19/19 1112 05/20/19 0421 05/22/19 0230  AST  --   --  17  --  16  ALT  --   --  9  --  6  ALKPHOS  --   --  38  --  38  BILITOT  --   --  0.4  --  1.0  PROT 6.2*  --  5.9*  --  5.6*  ALBUMIN  --  3.1* 3.0* 3.0* 2.6*   No results for input(s): LIPASE, AMYLASE in the last 168 hours. No results for input(s): AMMONIA in the last 168 hours. Coagulation Profile: Recent Labs  Lab 05/19/19 1112  INR 1.1   Cardiac Enzymes: No results for input(s): CKTOTAL, CKMB, CKMBINDEX, TROPONINI in the last 168 hours. BNP (last 3 results) No results for input(s): PROBNP in the last 8760 hours. HbA1C: No results for input(s): HGBA1C in the last 72 hours. CBG: No results for input(s): GLUCAP in the last 168 hours. Lipid Profile: No results for input(s): CHOL, HDL, LDLCALC, TRIG, CHOLHDL, LDLDIRECT in the last 72 hours. Thyroid Function Tests: No results for input(s): TSH, T4TOTAL, FREET4, T3FREE, THYROIDAB in the last 72 hours. Anemia Panel: No results for input(s): VITAMINB12, FOLATE, FERRITIN, TIBC, IRON, RETICCTPCT in the last 72 hours. Sepsis Labs: No results for input(s): PROCALCITON, LATICACIDVEN in the last 168 hours.  Recent Results (from the past 240 hour(s))  SARS Coronavirus 2 (CEPHEID - Performed in [place] hospital lab), Hosp Order     Status: None   Collection Time: 05/14/19  6:40 PM  Result Value Ref Range Status   SARS Coronavirus 2 NEGATIVE NEGATIVE Final    Comment: (NOTE) If result is NEGATIVE SARS-CoV-2 target nucleic acids are NOT DETECTED. The SARS-CoV-2 RNA is generally detectable in upper and lower  respiratory specimens during the acute phase of  infection. The lowest  concentration of SARS-CoV-2 viral copies this assay can detect is 250  copies / mL. A negative result does not preclude SARS-CoV-2 infection  and should not be used as the sole basis for treatment or other  patient management decisions.  A negative result may occur with  improper specimen collection / handling, submission of specimen other  than nasopharyngeal swab, presence of viral mutation(s) within the  areas targeted by this assay, and inadequate number of viral copies  (<250 copies / mL). A negative result must be combined  with clinical  observations, patient history, and epidemiological information. If result is POSITIVE SARS-CoV-2 target nucleic acids are DETECTED. The SARS-CoV-2 RNA is generally detectable in upper and lower  respiratory specimens dur ing the acute phase of infection.  Positive  results are indicative of active infection with SARS-CoV-2.  Clinical  correlation with patient history and other diagnostic information is  necessary to determine patient infection status.  Positive results do  not rule out bacterial infection or co-infection with other viruses. If result is PRESUMPTIVE POSTIVE SARS-CoV-2 nucleic acids MAY BE PRESENT.   A presumptive positive result was obtained on the submitted specimen  and confirmed on repeat testing.  While 2019 novel coronavirus  (SARS-CoV-2) nucleic acids may be present in the submitted sample  additional confirmatory testing may be necessary for epidemiological  and / or clinical management purposes  to differentiate between  SARS-CoV-2 and other Sarbecovirus currently known to infect humans.  If clinically indicated additional testing with an alternate test  methodology [phone]) is advised. The SARS-CoV-2 RNA is generally  detectable in upper and lower respiratory sp ecimens during the acute  phase of infection. The expected result is Negative. Fact Sheet for Patients:   StrictlyIdeas.no Fact Sheet for Healthcare Providers: BankingDealers.co.za This test is not yet approved or cleared by the [place] FDA and has been authorized for detection and/or diagnosis of SARS-CoV-2 by FDA under an Emergency Use Authorization (EUA).  This EUA will remain in effect (meaning this test can be used) for the duration of the COVID-19 declaration under Section 564(b)(1) of the Act, 21 U.S.C. section 360bbb-3(b)(1), unless the authorization is terminated or revoked sooner. Performed at [facility], [address], [place], [place] [address]   Body fluid culture     Status: None   Collection Time: 05/16/19 12:48 PM  Result Value Ref Range Status   Specimen Description PLEURAL  Final   Special Requests NONE  Final   Gram Stain   Final    MODERATE WBC PRESENT,BOTH PMN AND MONONUCLEAR NO ORGANISMS SEEN    Culture   Final    NO GROWTH 3 DAYS Performed at [facility], [place] [address] [place], [place] [address]    Report Status 05/19/2019 FINAL  Final  Acid Fast Smear (AFB)     Status: None   Collection Time: 05/16/19 12:48 PM  Result Value Ref Range Status   AFB Specimen Processing Concentration  Final   Acid Fast Smear Negative  Final    Comment: (NOTE) Performed At: [facility] [place], [place] [id number] Rush Farmer MD DD:[id number]    Source (AFB) FLUID  Final    Comment: PLEURAL Performed at [facility], [place] [address], [place], [place] [address]   Fungus Culture With Stain     Status: None (Preliminary result)   Collection Time: 05/16/19 12:48 PM  Result Value Ref Range Status   Fungus Stain Final report  Final    Comment: (NOTE) Performed At: [facility] [place], [place] [id number] Rush Farmer MD [id number]    Fungus (Mycology) Culture PENDING  Incomplete   Fungal Source FLUID  Final    Comment: PLEURAL Performed at [facility], [place] [address], [place],  [address]   Fungus Culture Result     Status: None   Collection Time: 05/16/19 12:48 PM  Result Value Ref Range Status   Result 1 Comment  Final    Comment: (NOTE) KOH/Calcofluor preparation:  no fungus observed. Performed At: [facility] [place], [place] [id number] Rush Farmer MD UK:[id number]   Surgical pcr screen     Status: None   Collection Time: 05/18/19  5:38 PM  Result Value Ref Range Status   MRSA, PCR NEGATIVE NEGATIVE Final   Staphylococcus aureus NEGATIVE NEGATIVE Final    Comment: (NOTE) The Xpert SA Assay (FDA approved for NASAL specimens in patients 83 years of age and older), is one component of a comprehensive surveillance program. It is not intended to diagnose infection nor to guide or monitor treatment. Performed at [facility], [place] [address], [place], [place] [address]   Expectorated sputum assessment w rflx to resp cult     Status: None   Collection Time: 05/18/19  7:25 PM  Result Value Ref Range Status   Specimen Description EXPECTORATED SPUTUM  Final   Special Requests Normal  Final   Sputum evaluation   Final    THIS SPECIMEN IS ACCEPTABLE FOR SPUTUM CULTURE Performed at [facility], [place] [address], [place], [place] [address]    Report Status 05/18/2019 FINAL  Final  Culture, respiratory     Status: None   Collection Time: 05/18/19  7:25 PM  Result Value Ref Range Status   Specimen Description EXPECTORATED SPUTUM  Final   Special Requests Normal Reflexed from [id number]  Final   Gram Stain   Final    RARE WBC PRESENT,BOTH PMN AND MONONUCLEAR FEW GRAM POSITIVE COCCI RARE GRAM NEGATIVE RODS    Culture   Final    FEW Consistent with normal respiratory flora. Performed at [facility], [place] [address] [place], [place] [address]    Report Status 05/21/2019 FINAL  Final  Anaerobic culture     Status: None (Preliminary result)   Collection Time: 05/20/19  8:35 AM   Result Value Ref Range Status   Specimen Description PLEURAL  Final   Special Requests   Final    RIGHT Performed at [facility], [place] [address], [place], [place] [address]    Culture   Final    NO ANAEROBES ISOLATED; CULTURE IN PROGRESS FOR 5 DAYS   Report Status PENDING  Incomplete  Acid Fast Smear (AFB)     Status: None   Collection Time: 05/20/19  8:35 AM  Result Value Ref Range Status   AFB Specimen Processing Concentration  Final   Acid Fast Smear Negative  Final    Comment: (NOTE) Performed At: [facility] [place], [place] [id number] Rush Farmer MD FG:[id number]    Source (AFB) PLEURAL  Final    Comment: Performed at [facility], [place] [address], [place], [place] [address]  Aerobic Culture (superficial specimen)     Status: None   Collection Time: 05/20/19  8:39 AM  Result Value Ref Range Status   Specimen Description PLEURAL  Final   Special Requests RIGHT  Final   Gram Stain   Final    FEW WBC PRESENT, PREDOMINANTLY PMN NO ORGANISMS SEEN    Culture   Final    NO GROWTH 2 DAYS Performed at [facility], [place] [address] [place], [place] [address]    Report Status 05/22/2019 FINAL  Final         Radiology Studies: Dg Chest Port 1 View  Result Date: 05/22/2019 CLINICAL DATA:  Chest tube in place EXAM: PORTABLE CHEST 1 VIEW COMPARISON:  05/20/2019 FINDINGS: Interval extubation. Medial  right chest tube.  No pneumothorax is seen. Mild right basilar opacity, likely a combination of mildly complex pleural effusion and atelectasis. Left lung is clear. The heart is normal in size. Postsurgical changes related to prior CABG. Median sternotomy. IMPRESSION: Interval extubation. Stable right chest tube.  No pneumothorax is seen. Stable right basilar opacity, likely combination of atelectasis and complex pleural effusion. Electronically Signed   By: Julian Hy M.D.   On: 05/22/2019 06:46        Scheduled Meds: .  acetaminophen  1,000 mg Oral Q6H   Or  . acetaminophen (TYLENOL) oral liquid 160 mg/5 mL  1,000 mg Oral Q6H  . aspirin EC  81 mg Oral Daily  . bisacodyl  10 mg Oral Daily  . carbidopa-levodopa  1 tablet Oral TID  . Chlorhexidine Gluconate Cloth  6 each Topical Daily  . fluticasone furoate-vilanterol  1 puff Inhalation Daily  . folic acid  1 mg Oral Daily  . levothyroxine  200 mcg Oral QHS  . losartan  50 mg Oral QHS  . pantoprazole  40 mg Oral Daily  . predniSONE  10 mg Oral Q breakfast  . rOPINIRole  0.5 mg Oral QHS  . senna-docusate  1 tablet Oral QHS  . tiZANidine  4 mg Oral QHS   Continuous Infusions: . sodium chloride 10 mL/hr at 05/22/19 0900  . potassium chloride       LOS: 7 days    Time spent: 25 minutes spent in the coordination of care today.    Jonnie Finner, DO [facility] Pager [phone]  If 7PM-7AM, please contact night-coverage [email or web address] Password [email or web address] 05/22/2019, 2:28 PM

## 2019-05-22 NOTE — Progress Notes (Signed)
2 Days Post-Op Procedure(s) (LRB): VIDEO ASSISTED THORACOSCOPY (Right) PLEURAL BIOPSY (Right) INSERTION PLEURAL DRAINAGE CATHETER (Right) Subjective: Feels lousy but slept some. Still some nausea at times. Wants foley out. Pain under control. Not using PCA. Objective: Vital signs in last 24 hours: Temp:  [97.5 F (36.4 C)-98.9 F (37.2 C)] 97.9 F (36.6 C) (05/31 0740) Pulse Rate:  [66-94] 88 (05/31 0740) Cardiac Rhythm: Normal sinus rhythm (05/31 0740) Resp:  [14-23] 15 (05/31 0740) BP: (98-135)/(51-85) 118/51 (05/31 0740) SpO2:  [92 %-98 %] 96 % (05/31 0700) Weight:  [98.9 kg] 98.9 kg (05/31 0500)  Hemodynamic parameters for last 24 hours:    Intake/Output from previous day: 05/30 0701 - 05/31 0700 In: 2336.9 [P.O.:600; I.V.:1736.9] Out: 2020 [Urine:1810; Chest Tube:210] Intake/Output this shift: No intake/output data recorded.  General appearance: alert and cooperative Heart: regular rate and rhythm, S1, S2 normal, no murmur, click, rub or gallop Lungs: clear to auscultation bilaterally Wound: dressings dry chest tube output serous, no air leak.  Lab Results: Recent Labs    05/21/19 0559 05/22/19 0230  WBC 13.8* 14.3*  HGB 14.2 12.9*  HCT 43.6 39.9  PLT 266 227   BMET:  Recent Labs    05/21/19 0559 05/22/19 0230  NA 136 136  K 4.7 4.3  CL 100 103  CO2 25 26  GLUCOSE 121* 107*  BUN 16 14  CREATININE 1.23 1.11  CALCIUM 9.2 8.8*    PT/INR:  Recent Labs    05/19/19 1112  LABPROT 13.6  INR 1.1   ABG    Component Value Date/Time   PHART 7.376 05/21/2019 0420   HCO3 24.3 05/21/2019 0420   TCO2 26 05/21/2019 0420   ACIDBASEDEF 1.0 05/21/2019 0420   O2SAT 96.0 05/21/2019 0420   CBG (last 3)  No results for input(s): GLUCAP in the last 72 hours.  CXR: ok  Assessment/Plan: S/P Procedure(s) (LRB): VIDEO ASSISTED THORACOSCOPY (Right) PLEURAL BIOPSY (Right) INSERTION PLEURAL DRAINAGE CATHETER (Right)  POD 2 Hemodynamically stable.  Chest  tube output decreasing. Will put to water seal and may be able to remove tomorrow.  DC foley.  [facility] to [facility].  DC PCA. Use oral pain med.  IS, ambulation.   LOS: 7 days    Gaye Pollack 05/22/2019

## 2019-05-22 NOTE — Plan of Care (Signed)
Progressing in goals, off o2, coughing and deep breathing, using IS. Ambulating well with walker, has slight shuffling, and tremor in left arm/hand, not new. Plan to continue vigorous pulmonary toilet, ambulate

## 2019-05-22 NOTE — Progress Notes (Signed)
Patient up in chair most of morning. States he is in some pain but can cough deep breathe, does not want any narcotics, as last time he had pain medicine he had confusion and hallucinations like he was "going crazy" he states this is worse than the pain itself. He also stated he has anxiety, he knows he can have xanax if needed.

## 2019-05-23 ENCOUNTER — Inpatient Hospital Stay (HOSPITAL_COMMUNITY): Payer: Medicare Other

## 2019-05-23 DIAGNOSIS — R7989 Other specified abnormal findings of blood chemistry: Secondary | ICD-10-CM

## 2019-05-23 DIAGNOSIS — R0602 Shortness of breath: Secondary | ICD-10-CM

## 2019-05-23 DIAGNOSIS — I2583 Coronary atherosclerosis due to lipid rich plaque: Secondary | ICD-10-CM

## 2019-05-23 DIAGNOSIS — I2510 Atherosclerotic heart disease of native coronary artery without angina pectoris: Secondary | ICD-10-CM

## 2019-05-23 LAB — TYPE AND SCREEN
ABO/RH(D): A POS
Antibody Screen: NEGATIVE
Unit division: 0
Unit division: 0

## 2019-05-23 LAB — RENAL FUNCTION PANEL
Albumin: 2.7 g/dL — ABNORMAL LOW (ref 3.5–5.0)
Anion gap: 8 (ref 5–15)
BUN: 11 mg/dL (ref 8–23)
CO2: 25 mmol/L (ref 22–32)
Calcium: 8.9 mg/dL (ref 8.9–10.3)
Chloride: 105 mmol/L (ref 98–111)
Creatinine, Ser: 1.27 mg/dL — ABNORMAL HIGH (ref 0.61–1.24)
GFR calc Af Amer: >60 mL/min (ref >60)
GFR calc non Af Amer: 52 mL/min — ABNORMAL LOW (ref >60)
Glucose, Bld: 107 mg/dL — ABNORMAL HIGH (ref 70–99)
Phosphorus: 2.3 mg/dL — ABNORMAL LOW (ref 2.5–4.6)
Potassium: 3.7 mmol/L (ref 3.5–5.1)
Sodium: 138 mmol/L (ref 135–145)

## 2019-05-23 LAB — CBC WITH DIFFERENTIAL/PLATELET
Abs Immature Granulocytes: 0.03 10*3/uL (ref 0.00–0.07)
Basophils Absolute: 0 10*3/uL (ref 0.0–0.1)
Basophils Relative: 0 %
Eosinophils Absolute: 0.2 10*3/uL (ref 0.0–0.5)
Eosinophils Relative: 2 %
HCT: 37.7 % — ABNORMAL LOW (ref 39.0–52.0)
Hemoglobin: 12.4 g/dL — ABNORMAL LOW (ref 13.0–17.0)
Immature Granulocytes: 0 %
Lymphocytes Relative: 20 %
Lymphs Abs: 2.2 10*3/uL (ref 0.7–4.0)
MCH: 30.8 pg (ref 26.0–34.0)
MCHC: 32.9 g/dL (ref 30.0–36.0)
MCV: 93.5 fL (ref 80.0–100.0)
Monocytes Absolute: 1.5 10*3/uL — ABNORMAL HIGH (ref 0.1–1.0)
Monocytes Relative: 13 %
Neutro Abs: 7 10*3/uL (ref 1.7–7.7)
Neutrophils Relative %: 65 %
Platelets: 243 10*3/uL (ref 150–400)
RBC: 4.03 MIL/uL — ABNORMAL LOW (ref 4.22–5.81)
RDW: 14.9 % (ref 11.5–15.5)
WBC: 10.9 10*3/uL — ABNORMAL HIGH (ref 4.0–10.5)
nRBC: 0 % (ref 0.0–0.2)

## 2019-05-23 LAB — BPAM RBC
Blood Product Expiration Date: 202006122359
Blood Product Expiration Date: 202006192359
Unit Type and Rh: 6200
Unit Type and Rh: 6200

## 2019-05-23 LAB — TROPONIN I
Troponin I: 0.06 ng/mL (ref <0.03)
Troponin I: 0.16 ng/mL (ref <0.03)

## 2019-05-23 LAB — POCT I-STAT 7, (LYTES, BLD GAS, ICA,H+H)
Acid-Base Excess: 2 mmol/L (ref 0.0–2.0)
Bicarbonate: 26 mmol/L (ref 20.0–28.0)
Calcium, Ion: 1.28 mmol/L (ref 1.15–1.40)
HCT: 35 % — ABNORMAL LOW (ref 39.0–52.0)
Hemoglobin: 11.9 g/dL — ABNORMAL LOW (ref 13.0–17.0)
O2 Saturation: 97 %
Patient temperature: 98.5
Potassium: 3.3 mmol/L — ABNORMAL LOW (ref 3.5–5.1)
Sodium: 135 mmol/L (ref 135–145)
TCO2: 27 mmol/L (ref 22–32)
pCO2 arterial: 36 mmHg (ref 32.0–48.0)
pH, Arterial: 7.467 — ABNORMAL HIGH (ref 7.350–7.450)
pO2, Arterial: 81 mmHg — ABNORMAL LOW (ref 83.0–108.0)

## 2019-05-23 LAB — MAGNESIUM: Magnesium: 1.8 mg/dL (ref 1.7–2.4)

## 2019-05-23 MED ORDER — LOSARTAN POTASSIUM 25 MG PO TABS: 25.0000 mg | ORAL_TABLET | Freq: Every day | ORAL | Status: DC

## 2019-05-23 MED ORDER — POTASSIUM CHLORIDE CRYS ER 20 MEQ PO TBCR
20.0000 meq | EXTENDED_RELEASE_TABLET | ORAL | Status: AC
  Administered 2019-05-23 (×2): 20 meq via ORAL
  Filled 2019-05-23: qty 1

## 2019-05-23 MED ORDER — ALBUMIN HUMAN 5 % IV SOLN
12.5000 g | Freq: Once | INTRAVENOUS | Status: AC
  Administered 2019-05-23: 18:00:00 12.5 g via INTRAVENOUS
  Filled 2019-05-23: qty 250

## 2019-05-23 MED ORDER — ONDANSETRON HCL 4 MG/2ML IJ SOLN
4.0000 mg | Freq: Once | INTRAMUSCULAR | Status: AC
  Administered 2019-05-23: 4 mg via INTRAVENOUS
  Filled 2019-05-23: qty 2

## 2019-05-23 MED ORDER — MAGNESIUM OXIDE 400 (241.3 MG) MG PO TABS
400.0000 mg | ORAL_TABLET | Freq: Two times a day (BID) | ORAL | Status: DC
  Administered 2019-05-23 – 2019-05-28 (×11): 400 mg via ORAL
  Filled 2019-05-23 (×11): qty 1

## 2019-05-23 MED ORDER — METOPROLOL TARTRATE 12.5 MG HALF TABLET
12.5000 mg | ORAL_TABLET | Freq: Once | ORAL | Status: AC
  Administered 2019-05-23: 12.5 mg via ORAL
  Filled 2019-05-23: qty 1

## 2019-05-23 MED ORDER — HEPARIN (PORCINE) 25000 UT/250ML-% IV SOLN
1400.0000 [IU]/h | INTRAVENOUS | Status: DC
  Administered 2019-05-23: 1100 [IU]/h via INTRAVENOUS
  Filled 2019-05-23: qty 250

## 2019-05-23 MED ORDER — OXYCODONE HCL 5 MG PO TABS: 5.0000 mg | ORAL_TABLET | ORAL | Status: DC | PRN

## 2019-05-23 MED ORDER — ENOXAPARIN SODIUM 40 MG/0.4ML ~~LOC~~ SOLN: 40.0000 mg | SUBCUTANEOUS | Status: DC

## 2019-05-23 MED ORDER — IOHEXOL 350 MG/ML SOLN
75.0000 mL | Freq: Once | INTRAVENOUS | Status: AC | PRN
  Administered 2019-05-23: 75 mL via INTRAVENOUS

## 2019-05-23 MED ORDER — METOPROLOL TARTRATE 12.5 MG HALF TABLET: 12.5000 mg | ORAL_TABLET | Freq: Two times a day (BID) | ORAL | Status: DC

## 2019-05-23 NOTE — Consult Note (Addendum)
Cardiology Consultation:   Patient ID: Jeffrey [name] MRN: [id number]; DOB: [birth year]  Admit date: 05/14/2019 Date of Consult: 05/23/2019  Primary Care Provider: [name], MD Primary Cardiologist: New- previously followed at [facility]. Now followed in [place].  Primary Electrophysiologist:  None    Patient Profile:   Jeffrey [name] is a 85 y.o. male with a hx of CAD s/p NSTEMI w/ CABG in 29-Mar-2010, LCx PCI in 2011-03-30 and LAD PCI in 03/29/2012, aortic stenosis, HTN, HLD and parkinson disease, undergoing w/u for possible mesothelioma, s/p recent VATS who is being seen today for the evaluation of abnormal EKG and back pain at the request of, Dr. Marylyn Ishihara, Internal Medicine.  History of Present Illness:   Jeffrey [name] is a 85 y/o male with h/o CAD s/p NSTEMI 06/2010, CABG x3=> [place]->LAD, SVG->OM1, SVG->PDA, DES LCx 10/2011, DES LAD 12/2011, mild aortic stenosis by echo in 2013-03-29, HTN, HLD. Was followed by [facility] in the past. Now followed by Cardiology in [place]. Has parkinson's disease. 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  Recently developed worsening dyspnea prompting pt to come to the ED. CXR showed small right pleural effusion. CT angio of the chest showed NO PE, large loculated right pleural effusion with near collapse of the RLL, multiple calcified right pleural based plaques, ectatic thoracic aorta measuring approximately 4.2 cm in diameter, cholelithiasis, and 2.1 cm cystic pancreatic lesion. He required thoracentesis. Bloodly pleural fluid obtained. Pathology c/w reactive mesothelial cells and chronic inflammation, concerning for mesothelioma. He required VATS procedure for pleural biopsies. Had chest tubes pulled earlier today. Per RN, he was doing fine until just recently. He was ambulating the unit and became diaphoretic and nauseated and complained low back pain. HR increased into the 120s and BP was difficult to obtain. He was given zofran for nausea. He denied dyspnea. No  anterior CP. EKG obtained and showed mild ST elevation in lead III and ST depression in V2, V3, V4-6.   Pt is a poor historian. He keeps saying he "hurts all over" but denies anterior CP. Normal work of breathing. Also states his low back hurts. He denies upper back pain. Pressure is normotensive but soft in the low 646O systolic.  O2 sats in the low 90s on 5L [place]. Murmur noted on exam. Currently sinus tach on tele, 110s. Troponin abnormal at 0.06. Of note, he had an echo 5/24 that showed normal EF, impaired relaxation, mild AI and mild AS.   Past Medical History:  Diagnosis Date   Anxiety    Aortic stenosis    mild AS by 07/20/13 echo ([facility])   Arthritis    "all over"   Coronary artery disease    NSTEMI 06/2010, CABG x3=> [place]->LAD, SVG->OM1, SVG->PDA, DES LCx 10/2011, DES LAD 12/2011   Depression    GERD (gastroesophageal reflux disease)    H/O hiatal hernia    Hearing aid worn    B/L   High cholesterol    History of blood transfusion reaction 03-29-2013   "he almost died; he has to get irradiated blood next time"   Hypertension    Hypothyroidism    Ischemic cardiomyopathy    ITP (idiopathic thrombocytopenic purpura)    Dr. Gaynelle Arabian, on Promacta   Myocardial infarction [facility]) 03-29-10   Pneumonia 1940's X 1; 03-29-14 X 1   Rheumatoid arthritis ([place])    Shortness of breath    with exertion, has not been very active   Sleep apnea  Wears glasses    Wears partial dentures     Past Surgical History:  Procedure Laterality Date   APPENDECTOMY     BACK SURGERY     CATARACT EXTRACTION, BILATERAL     CHEST TUBE INSERTION Right 05/20/2019   Procedure: INSERTION PLEURAL DRAINAGE CATHETER;  Surgeon: Ivin Poot, MD;  Location: [place];  Service: Thoracic;  Laterality: Right;   COLONOSCOPY     CORONARY ANGIOPLASTY WITH STENT PLACEMENT     DES Lcx 10/2011, DES LAD 12/2011   CORONARY ARTERY BYPASS GRAFT  2011   "CABG X3"   GASTROC RECESSION  EXTREMITY Right 07/06/2015   Pasty Spillers (orthopedics- [place], [place])   HERNIA REPAIR     HIATAL HERNIA REPAIR     KNEE ARTHROSCOPY Left 06/22/2014   w/I&D   KNEE ARTHROSCOPY Left 06/22/2014   Procedure: [place];  Surgeon: Hessie Dibble, MD;  Location: [place];  Service: Orthopedics;  Laterality: Left;   LUMBAR DISC SURGERY  1960's?   MULTIPLE TOOTH EXTRACTIONS     PLEURAL BIOPSY Right 05/20/2019   Procedure: PLEURAL BIOPSY;  Surgeon: Ivin Poot, MD;  Location: [place];  Service: Thoracic;  Laterality: Right;   SHOULDER ARTHROSCOPY Right 04/09/2017   Procedure: ARTHROSCOPY SHOULDER;  Surgeon: Melrose Nakayama, MD;  Location: [place];  Service: Orthopedics;  Laterality: Right;   STERNAL CLOSURE     "wires from OHS taken out; plate put in" (02/21/2024)   SYNOVECTOMY Left 08/17/2014   Procedure: SYNOVECTOMY LEFT KNEE;  Surgeon: Hessie Dibble, MD;  Location: [place];  Service: Orthopedics;  Laterality: Left;   TOTAL ANKLE ARTHROPLASTY Right 07/06/2015   Pasty Spillers (orthopedics- [place])   TOTAL HIP ARTHROPLASTY Right 06/29/2018   Procedure: RIGHT TOTAL HIP ARTHROPLASTY ANTERIOR APPROACH;  Surgeon: Melrose Nakayama, MD;  Location: [facility];  Service: Orthopedics;  Laterality: Right;   TOTAL HIP ARTHROPLASTY Left 06/09/2016   Nicki Reaper Streater Claiborne Billings (orthopedics- [facility])   TOTAL KNEE ARTHROPLASTY Left 11/24/2014   Procedure: TOTAL KNEE ARTHROPLASTY;  Surgeon: Hessie Dibble, MD;  Location: [place];  Service: Orthopedics;  Laterality: Left;   VIDEO ASSISTED THORACOSCOPY Right 05/20/2019   Procedure: VIDEO ASSISTED THORACOSCOPY;  Surgeon: Ivin Poot, MD;  Location: [place];  Service: Thoracic;  Laterality: Right;     Home Medications:  Prior to Admission medications   Medication Sig Start Date End Date Taking? Authorizing Provider  ALPRAZolam (XANAX) 0.25 MG tablet Take 1 tablet (0.25 mg total) by mouth 2 (two) times daily as needed for anxiety.  07/02/18  Yes Loni Dolly, PA-C  aspirin EC 81 MG tablet Take 81 mg by mouth daily.   Yes [provider]  azithromycin (ZITHROMAX) 250 MG tablet Take 250-500 mg by mouth See admin instructions. Starting on 05/12/2019 take 500mg  on day 1 then take 250mg  on days 2 through 5   Yes [provider]  carbidopa-levodopa (SINEMET IR) 25-100 MG tablet Take 1 tablet by mouth 3 (three) times daily. 12/02/18  Yes Tat, Eustace Quail, DO  Fluticasone-Salmeterol (ADVAIR) 100-50 MCG/DOSE AEPB Inhale 1 puff into the lungs daily.   Yes [provider]  folic acid (FOLVITE) 1 MG tablet Take 1 mg by mouth daily.   Yes [provider]  levothyroxine (SYNTHROID, LEVOTHROID) 200 MCG tablet Take 200 mcg by mouth at bedtime.    Yes [provider]  losartan (COZAAR) 50 MG tablet Take 50 mg by mouth at bedtime. 05/28/18  Yes  [provider]  methotrexate (RHEUMATREX) 2.5 MG tablet Take 15 mg by mouth once a week. Caution:Chemotherapy. Protect from light.   Yes [provider]  Omega-3 Fatty Acids (FISH OIL) 1200 MG CAPS Take 1,200 mg by mouth at bedtime. 360 MG OMEGA-3   Yes [provider]  omeprazole (PRILOSEC) 40 MG capsule Take 40 mg by mouth at bedtime.    Yes [provider]  predniSONE (DELTASONE) 10 MG tablet Take 10 mg by mouth daily with breakfast.   Yes [provider]  rOPINIRole (REQUIP) 0.5 MG tablet Take 0.5 mg by mouth at bedtime.   Yes [provider]  tiZANidine (ZANAFLEX) 4 MG tablet Take 1 tablet (4 mg total) by mouth every 6 (six) hours as needed for muscle spasms. Patient taking differently: Take 4 mg by mouth at bedtime.  07/01/18 07/01/19 Yes Loni Dolly, PA-C  Vitamin D, Ergocalciferol, (DRISDOL) 1.25 MG (50000 UT) CAPS capsule Take 50,000 Units by mouth every 7 (seven) days.   Yes [provider]    Inpatient Medications: Scheduled Meds:  acetaminophen  1,000 mg Oral Q6H   Or   acetaminophen  (TYLENOL) oral liquid 160 mg/5 mL  1,000 mg Oral Q6H   aspirin EC  81 mg Oral Daily   bisacodyl  10 mg Oral Daily   carbidopa-levodopa  1 tablet Oral TID   Chlorhexidine Gluconate Cloth  6 each Topical Daily   enoxaparin (LOVENOX) injection  40 mg Subcutaneous Q24H   fluticasone furoate-vilanterol  1 puff Inhalation Daily   folic acid  1 mg Oral Daily   levothyroxine  200 mcg Oral QHS   losartan  25 mg Oral QHS   magnesium oxide  400 mg Oral BID   metoprolol tartrate  12.5 mg Oral BID   pantoprazole  40 mg Oral Daily   predniSONE  10 mg Oral Q breakfast   rOPINIRole  0.5 mg Oral QHS   senna-docusate  1 tablet Oral QHS   tiZANidine  4 mg Oral QHS   Continuous Infusions:  sodium chloride 10 mL/hr at 05/22/19 0900   potassium chloride     PRN Meds: ALPRAZolam, hydrALAZINE, ipratropium-albuterol, oxyCODONE, polyethylene glycol, potassium chloride, traMADol  Allergies:    Allergies  Allergen Reactions   Lipitor [Atorvastatin] Other (See Comments)    muscle spasms cramps   Methylprednisolone Other (See Comments)    cramps cramps   Other     If patient is to receive blood - blood must be treated witgh radiation because his body will not accept a normal infusion    Sulfa Antibiotics Itching   Doxycycline Rash    Doxycycline caused itching redness on scalp and head Doxycycline caused itching redness on scalp and head    Social History:   Social History   Socioeconomic History   Marital status: Married    Spouse name: Not on file   Number of children: Not on file   Years of education: 12+   Highest education level: Not on file  Occupational History   Occupation: retired    Comment: Teaching laboratory technician strain: Not on file   Food insecurity:    Worry: Not on file    Inability: Not on file   Transportation needs:    Medical: Not on file    Non-medical: Not on file  Tobacco Use   Smoking status: Former Smoker     Packs/day: 1.00    Years: 15.00    Pack  years: 15.00    Types: Cigarettes    Last attempt to quit: 12/22/1966    Years since quitting: 52.4   Smokeless tobacco: Former Systems developer    Types: Chew   Tobacco comment: "quit smoking ~ late ~ 60's; quit chewing in the 1970's"  Substance and Sexual Activity   Alcohol use: No   Drug use: No   Sexual activity: Not Currently  Lifestyle   Physical activity:    Days per week: Not on file    Minutes per session: Not on file   Stress: Not on file  Relationships   Social connections:    Talks on phone: Not on file    Gets together: Not on file    Attends religious service: Not on file    Active member of club or organization: Not on file    Attends meetings of clubs or organizations: Not on file    Relationship status: Not on file   Intimate partner violence:    Fear of current or ex partner: Not on file    Emotionally abused: Not on file    Physically abused: Not on file    Forced sexual activity: Not on file  Other Topics Concern   Not on file  Social History Narrative   Not on file    Family History:    Family History  Problem Relation Age of Onset   Heart disease Other    Arthritis Other    Heart disease Mother    Alzheimer's disease Father    Rheum arthritis Sister    Rheum arthritis Brother    Healthy Son      ROS:  Please see the history of present illness.   All other ROS reviewed and negative.     Physical Exam/Data:   Vitals:   05/23/19 1700 05/23/19 1740 05/23/19 1810 05/23/19 1900  BP:      Pulse: (!) 121 (!) 117 (!) 118   Resp:      Temp:    98.5 F (36.9 C)  TempSrc:    Oral  SpO2:   93%   Weight:      Height:        Intake/Output Summary (Last 24 hours) at 05/23/2019 2007 Last data filed at 05/23/2019 1600 Gross per 24 hour  Intake 360 ml  Output 1305 ml  Net -945 ml   Last 3 Weights 05/23/2019 05/22/2019 05/21/2019  Weight (lbs) 216 lb 0.8 oz 218 lb 0.6 oz 218 lb 7.6 oz  Weight (kg) 98  kg 98.9 kg 99.1 kg     Body mass index is 28.5 kg/m.  General:  Elderly white male in no acute distress, has resting tremor HEENT: normal Lymph: no adenopathy Neck: no JVD Endocrine:  No thryomegaly Vascular: No carotid bruits; FA pulses 2+ bilaterally without bruits  Cardiac:  Regular rhythm, tachycardic, 2/6 murmur at RUSB Lungs:  Decreased BS at the bases  Abd: soft, nontender, no hepatomegaly  Ext: no edema Musculoskeletal:  No deformities, BUE and BLE strength normal and equal Skin: warm and dry  Neuro:  Resting tremor at baseline (PD) Psych:  Normal affect   EKG:  The EKG was personally reviewed and demonstrates:   showed mild ST elevation in lead III and ST depression in V2, V3, V4-6.  Telemetry:  Telemetry was personally reviewed and demonstrates:  Sinus tach 116 bpm   Relevant CV Studies: 2D Echo 05/15/19 IMPRESSIONS    1. The left ventricle has normal systolic function, with  an ejection fraction of 55-60%. The cavity size was normal. Left ventricular diastolic Doppler parameters are consistent with impaired relaxation.  2. The right ventricle has normal systolic function. The cavity was normal. There is no increase in right ventricular wall thickness.  3. The aortic valve is abnormal. Moderate thickening of the aortic valve. Moderate calcification of the aortic valve. Aortic valve regurgitation is mild by color flow Doppler. Mild stenosis of the aortic valve. Moderate aortic annular calcification  noted.  4. There is mild dilatation of the aortic root.  5. The interatrial septum was not assessed.  FINDINGS  Laboratory Data:  Chemistry Recent Labs  Lab 05/21/19 0559 05/22/19 0230 05/23/19 0338  NA 136 136 138  K 4.7 4.3 3.7  CL 100 103 105  CO2 25 26 25   GLUCOSE 121* 107* 107*  BUN 16 14 11   CREATININE 1.23 1.11 1.27*  CALCIUM 9.2 8.8* 8.9  GFRNONAA 54* >60 52*  GFRAA >60 >60 >60  ANIONGAP 11 7 8     Recent Labs  Lab 05/19/19 1112 05/20/19 0421  05/22/19 0230 05/23/19 0338  PROT 5.9*  --  5.6*  --   ALBUMIN 3.0* 3.0* 2.6* 2.7*  AST 17  --  16  --   ALT 9  --  6  --   ALKPHOS 38  --  38  --   BILITOT 0.4  --  1.0  --    Hematology Recent Labs  Lab 05/21/19 0559 05/22/19 0230 05/23/19 0338  WBC 13.8* 14.3* 10.9*  RBC 4.56 4.20* 4.03*  HGB 14.2 12.9* 12.4*  HCT 43.6 39.9 37.7*  MCV 95.6 95.0 93.5  MCH 31.1 30.7 30.8  MCHC 32.6 32.3 32.9  RDW 15.1 14.9 14.9  PLT 266 227 243   Cardiac Enzymes Recent Labs  Lab 05/23/19 1830  TROPONINI 0.06*   No results for input(s): TROPIPOC in the last 168 hours.  BNPNo results for input(s): BNP, PROBNP in the last 168 hours.  DDimer No results for input(s): DDIMER in the last 168 hours.  Radiology/Studies:  Dg Chest Port 1 View  Result Date: 05/23/2019 CLINICAL DATA:  Chest tube removal. EXAM: PORTABLE CHEST 1 VIEW COMPARISON:  Radiograph of same day. FINDINGS: Stable cardiomediastinal silhouette. Right-sided chest tube is unchanged in position. No pneumothorax is noted. Left lung is clear. Stable right basilar atelectasis is noted with probable minimal pleural effusion. IMPRESSION: Stable position of right-sided chest tube is noted. No pneumothorax is noted. Stable right basilar atelectasis is noted. Electronically Signed   By: Marijo Conception M.D.   On: 05/23/2019 18:44   Dg Chest Port 1 View  Result Date: 05/23/2019 CLINICAL DATA:  85 year old male status post right side VATS with talc pleurodesis postoperative day 3. EXAM: PORTABLE CHEST 1 VIEW COMPARISON:  05/22/2019 and earlier. FINDINGS: Portable AP semi upright view at 0530 hours. Stable right chest tubes. No pneumothorax identified. Stable lung volumes and ventilation with patchy right lung base opacity. Stable cardiac size and mediastinal contours. Prior CABG. Paucity of bowel gas in the upper abdomen. No acute osseous abnormality identified. IMPRESSION: Stable chest tubes and postoperative appearance of the right chest. No  pneumothorax . Electronically Signed   By: Genevie Ann M.D.   On: 05/23/2019 06:35   Dg Chest Port 1 View  Result Date: 05/22/2019 CLINICAL DATA:  Chest tube in place EXAM: PORTABLE CHEST 1 VIEW COMPARISON:  05/20/2019 FINDINGS: Interval extubation. Medial right chest tube.  No pneumothorax is seen. Mild right basilar opacity,  likely a combination of mildly complex pleural effusion and atelectasis. Left lung is clear. The heart is normal in size. Postsurgical changes related to prior CABG. Median sternotomy. IMPRESSION: Interval extubation. Stable right chest tube.  No pneumothorax is seen. Stable right basilar opacity, likely combination of atelectasis and complex pleural effusion. Electronically Signed   By: Julian Hy M.D.   On: 05/22/2019 06:46   Dg Chest Portable 1 View  Result Date: 05/20/2019 CLINICAL DATA:  VATS. EXAM: PORTABLE CHEST 1 VIEW COMPARISON:  05/16/2019. FINDINGS: Endotracheal tube tip approximately 1.9 cm above the carina. Right chest tube noted medial right chest. Tiny right base pleural air collection noted. This is most likely related to fluid evacuation and follow-up exam is suggested. No prominent pneumothorax noted. Bibasilar atelectasis/infiltrates. Small left pleural effusion. Prior CABG. Stable cardiomegaly. IMPRESSION: 1.  Endotracheal tube tip approximately 1.9 cm above the carina. 2. Right chest tube noted over the medial right chest with evacuation of the previously identified right pleural effusion. Tiny collection of right base pleural air is noted, this is most likely related to fluid evacuation and a follow-up exam is suggested. No prominent pneumothorax noted. 3.  Bibasilar atelectasis/infiltrates.  Small left pleural effusion. These results will be called to the ordering clinician or representative by the Radiologist Assistant, and communication documented in the PACS or zVision Dashboard. Electronically Signed   By: Marcello Moores  Register   On: 05/20/2019 10:05     Assessment and Plan:   Jeffrey [name] is a 85 y.o. male with a hx of CAD s/p NSTEMI w/ CABG in 2011, LCx PCI in 2012 and LAD PCI in 2013, aortic stenosis, HTN, HLD and parkinson disease, undergoing w/u for possible mesothelioma, s/p recent VATS who is being seen today for the evaluation of abnormal EKG and back pain at the request of, Dr. Marylyn Ishihara, Internal Medicine. He denies anterior CP. His back pain is low. No upper back pain.  EKG showed mild ST elevation in lead III and ST depression in V2, V3, V4-6.  Initial troponin is 0.06. Also hypoxia w/ increased O2 requirements, currently on 5L/min [place]. Concerns for possible PE vs coronary ischemia. Will get stat chest CT to w/o PE. Regardless, he would benefit from anticoagulation and possible cath if CT is negative for PE.  heparin per pharmacy w/o bolus. Of note, he had recent echo 5/24 showing normal EF and only mild AI and mild AS. Recommend cycling enzymes and further observation.    For questions or updates, please contact [place] Please consult [email or web address] for contact info under     Signed, Lyda Jester, PA-C  05/23/2019 8:07 PM

## 2019-05-23 NOTE — Progress Notes (Signed)
Updated wife on events. CT scan pending .

## 2019-05-23 NOTE — Progress Notes (Signed)
Patient c/o nausea cant eat, back pain, denies shortness of breath, but has back pain, PA Zimmerman paged, 12 lead EKG obtained. HR increasing to 120's BP  Difficult to obtain as patient is shaking due to parkinsons.  Gave zofran for nausea x 1  Will continue to monitor

## 2019-05-23 NOTE — Progress Notes (Addendum)
[address]       [place] [address]             [phone]      3 Days Post-Op Procedure(s) (LRB): VIDEO ASSISTED THORACOSCOPY (Right) PLEURAL BIOPSY (Right) INSERTION PLEURAL DRAINAGE CATHETER (Right) Subjective: Looks and feels pretty well, ambulating in the unit  Objective: Vital signs in last 24 hours: Temp:  [96.8 F (36 C)-98.3 F (36.8 C)] 96.8 F (36 C) (06/01 0700) Pulse Rate:  [62-166] 65 (06/01 0700) Cardiac Rhythm: Normal sinus rhythm (05/31 2000) Resp:  [11-21] 17 (06/01 0700) BP: (78-142)/(48-86) 78/52 (06/01 0700) SpO2:  [90 %-96 %] 95 % (06/01 0807) Weight:  [98 kg] 98 kg (06/01 0600)  Hemodynamic parameters for last 24 hours:    Intake/Output from previous day: 05/31 0701 - 06/01 0700 In: 886.1 [P.O.:680; I.V.:206.1] Out: 1735 [Urine:1395; Chest Tube:340] Intake/Output this shift: No intake/output data recorded.  General appearance: alert, cooperative and no distress Heart: regular rate and rhythm Lungs: dim in right lower fields, left is clear Abdomen: soft, nontender Extremities: no edem Wound: dressings CDI  Lab Results: Recent Labs    05/22/19 0230 05/23/19 0338  WBC 14.3* 10.9*  HGB 12.9* 12.4*  HCT 39.9 37.7*  PLT 227 243   BMET:  Recent Labs    05/22/19 0230 05/23/19 0338  NA 136 138  K 4.3 3.7  CL 103 105  CO2 26 25  GLUCOSE 107* 107*  BUN 14 11  CREATININE 1.11 1.27*  CALCIUM 8.8* 8.9    PT/INR: No results for input(s): LABPROT, INR in the last 72 hours. ABG    Component Value Date/Time   PHART 7.376 05/21/2019 0420   HCO3 24.3 05/21/2019 0420   TCO2 26 05/21/2019 0420   ACIDBASEDEF 1.0 05/21/2019 0420   O2SAT 96.0 05/21/2019 0420   CBG (last 3)  No results for input(s): GLUCAP in the last 72 hours.  Meds Scheduled Meds: . acetaminophen  1,000 mg Oral Q6H   Or  . acetaminophen (TYLENOL) oral liquid 160 mg/5 mL  1,000 mg Oral Q6H  . aspirin EC  81 mg Oral Daily  . bisacodyl  10  mg Oral Daily  . carbidopa-levodopa  1 tablet Oral TID  . Chlorhexidine Gluconate Cloth  6 each Topical Daily  . fluticasone furoate-vilanterol  1 puff Inhalation Daily  . folic acid  1 mg Oral Daily  . levothyroxine  200 mcg Oral QHS  . losartan  50 mg Oral QHS  . pantoprazole  40 mg Oral Daily  . potassium chloride  20 mEq Oral Q4H  . predniSONE  10 mg Oral Q breakfast  . rOPINIRole  0.5 mg Oral QHS  . senna-docusate  1 tablet Oral QHS  . tiZANidine  4 mg Oral QHS   Continuous Infusions: . sodium chloride 10 mL/hr at 05/22/19 0900  . potassium chloride     PRN Meds:.ALPRAZolam, hydrALAZINE, ipratropium-albuterol, oxyCODONE, polyethylene glycol, potassium chloride, traMADol  Xrays Dg Chest Port 1 View  Result Date: 05/23/2019 CLINICAL DATA:  85 year old male status post right side VATS with talc pleurodesis postoperative day 3. EXAM: PORTABLE CHEST 1 VIEW COMPARISON:  05/22/2019 and earlier. FINDINGS: Portable AP semi upright view at 0530 hours. Stable right chest tubes. No pneumothorax identified. Stable lung volumes and ventilation with patchy right lung base opacity. Stable cardiac size and mediastinal contours. Prior CABG. Paucity of bowel gas in the upper abdomen. No acute osseous abnormality identified. IMPRESSION: Stable chest  tubes and postoperative appearance of the right chest. No pneumothorax . Electronically Signed   By: Genevie Ann M.D.   On: 05/23/2019 06:35   Dg Chest Port 1 View  Result Date: 05/22/2019 CLINICAL DATA:  Chest tube in place EXAM: PORTABLE CHEST 1 VIEW COMPARISON:  05/20/2019 FINDINGS: Interval extubation. Medial right chest tube.  No pneumothorax is seen. Mild right basilar opacity, likely a combination of mildly complex pleural effusion and atelectasis. Left lung is clear. The heart is normal in size. Postsurgical changes related to prior CABG. Median sternotomy. IMPRESSION: Interval extubation. Stable right chest tube.  No pneumothorax is seen. Stable right  basilar opacity, likely combination of atelectasis and complex pleural effusion. Electronically Signed   By: Julian Hy M.D.   On: 05/22/2019 06:46   Results for orders placed or performed during the hospital encounter of 05/14/19  SARS Coronavirus 2 (CEPHEID - Performed in [place] hospital lab), Hosp Order     Status: None   Collection Time: 05/14/19  6:40 PM  Result Value Ref Range Status   SARS Coronavirus 2 NEGATIVE NEGATIVE Final    Comment: (NOTE) If result is NEGATIVE SARS-CoV-2 target nucleic acids are NOT DETECTED. The SARS-CoV-2 RNA is generally detectable in upper and lower  respiratory specimens during the acute phase of infection. The lowest  concentration of SARS-CoV-2 viral copies this assay can detect is 250  copies / mL. A negative result does not preclude SARS-CoV-2 infection  and should not be used as the sole basis for treatment or other  patient management decisions.  A negative result may occur with  improper specimen collection / handling, submission of specimen other  than nasopharyngeal swab, presence of viral mutation(s) within the  areas targeted by this assay, and inadequate number of viral copies  (<250 copies / mL). A negative result must be combined with clinical  observations, patient history, and epidemiological information. If result is POSITIVE SARS-CoV-2 target nucleic acids are DETECTED. The SARS-CoV-2 RNA is generally detectable in upper and lower  respiratory specimens dur ing the acute phase of infection.  Positive  results are indicative of active infection with SARS-CoV-2.  Clinical  correlation with patient history and other diagnostic information is  necessary to determine patient infection status.  Positive results do  not rule out bacterial infection or co-infection with other viruses. If result is PRESUMPTIVE POSTIVE SARS-CoV-2 nucleic acids MAY BE PRESENT.   A presumptive positive result was obtained on the submitted specimen   and confirmed on repeat testing.  While 2019 novel coronavirus  (SARS-CoV-2) nucleic acids may be present in the submitted sample  additional confirmatory testing may be necessary for epidemiological  and / or clinical management purposes  to differentiate between  SARS-CoV-2 and other Sarbecovirus currently known to infect humans.  If clinically indicated additional testing with an alternate test  methodology [phone]) is advised. The SARS-CoV-2 RNA is generally  detectable in upper and lower respiratory sp ecimens during the acute  phase of infection. The expected result is Negative. Fact Sheet for Patients:  StrictlyIdeas.no Fact Sheet for Healthcare Providers: BankingDealers.co.za This test is not yet approved or cleared by the [place] FDA and has been authorized for detection and/or diagnosis of SARS-CoV-2 by FDA under an Emergency Use Authorization (EUA).  This EUA will remain in effect (meaning this test can be used) for the duration of the COVID-19 declaration under Section 564(b)(1) of the Act, 21 U.S.C. section 360bbb-3(b)(1), unless the authorization is terminated or revoked  sooner. Performed at [facility], [address] [place], [place] [address]   Body fluid culture     Status: None   Collection Time: 05/16/19 12:48 PM  Result Value Ref Range Status   Specimen Description PLEURAL  Final   Special Requests NONE  Final   Gram Stain   Final    MODERATE WBC PRESENT,BOTH PMN AND MONONUCLEAR NO ORGANISMS SEEN    Culture   Final    NO GROWTH 3 DAYS Performed at [facility], [place] [address] [place], [place] [address]    Report Status 05/19/2019 FINAL  Final  Acid Fast Smear (AFB)     Status: None   Collection Time: 05/16/19 12:48 PM  Result Value Ref Range Status   AFB Specimen Processing Concentration  Final   Acid Fast Smear Negative  Final    Comment: (NOTE) Performed At: [facility] [place], [place] [id number] Rush Farmer MD PJ:[id number]    Source (AFB) FLUID  Final    Comment: PLEURAL Performed at [facility], [place] [address], [place], [place] [address]   Fungus Culture With Stain     Status: None (Preliminary result)   Collection Time: 05/16/19 12:48 PM  Result Value Ref Range Status   Fungus Stain Final report  Final    Comment: (NOTE) Performed At: [facility] [place], [place] [id number] Rush Farmer MD [id number]    Fungus (Mycology) Culture PENDING  Incomplete   Fungal Source FLUID  Final    Comment: PLEURAL Performed at [facility], [place] [address], [place], [place] [address]   Fungus Culture Result     Status: None   Collection Time: 05/16/19 12:48 PM  Result Value Ref Range Status   Result 1 Comment  Final    Comment: (NOTE) KOH/Calcofluor preparation:  no fungus observed. Performed At: [facility] [place], [place] [id number] Rush Farmer MD ST:[id number]   Surgical pcr screen     Status: None   Collection Time: 05/18/19  5:38 PM  Result Value Ref Range Status   MRSA, PCR NEGATIVE NEGATIVE Final   Staphylococcus aureus NEGATIVE NEGATIVE Final    Comment: (NOTE) The Xpert SA Assay (FDA approved for NASAL specimens in patients 40 years of age and older), is one component of a comprehensive surveillance program. It is not intended to diagnose infection nor to guide or monitor treatment. Performed at [facility], [place] [address] [place], [place] [address]   Expectorated sputum assessment w rflx to resp cult     Status: None   Collection Time: 05/18/19  7:25 PM  Result Value Ref Range Status   Specimen Description EXPECTORATED SPUTUM  Final   Special Requests Normal  Final   Sputum evaluation   Final    THIS SPECIMEN IS ACCEPTABLE FOR SPUTUM CULTURE Performed at [facility], [place] [address] [place], [place] [address]    Report Status 05/18/2019 FINAL   Final  Culture, respiratory     Status: None   Collection Time: 05/18/19  7:25 PM  Result Value Ref Range Status   Specimen Description EXPECTORATED SPUTUM  Final   Special Requests Normal Reflexed from [id number]  Final   Gram Stain   Final    RARE WBC PRESENT,BOTH PMN AND MONONUCLEAR FEW GRAM POSITIVE COCCI RARE GRAM NEGATIVE RODS    Culture   Final    FEW Consistent with normal respiratory flora. Performed  at [facility], [place] [address], [place], [place] [address]    Report Status 05/21/2019 FINAL  Final  Anaerobic culture     Status: None (Preliminary result)   Collection Time: 05/20/19  8:35 AM  Result Value Ref Range Status   Specimen Description PLEURAL  Final   Special Requests   Final    RIGHT Performed at [facility], [place] [address] [place], [place] [address]    Culture   Final    NO ANAEROBES ISOLATED; CULTURE IN PROGRESS FOR 5 DAYS   Report Status PENDING  Incomplete  Acid Fast Smear (AFB)     Status: None   Collection Time: 05/20/19  8:35 AM  Result Value Ref Range Status   AFB Specimen Processing Concentration  Final   Acid Fast Smear Negative  Final    Comment: (NOTE) Performed At: [facility] [place], [place] [id number] Rush Farmer MD WG:[id number]    Source (AFB) PLEURAL  Final    Comment: Performed at [facility], [place] [address] [place], [place] [address]  Aerobic Culture (superficial specimen)     Status: None   Collection Time: 05/20/19  8:39 AM  Result Value Ref Range Status   Specimen Description PLEURAL  Final   Special Requests RIGHT  Final   Gram Stain   Final    FEW WBC PRESENT, PREDOMINANTLY PMN NO ORGANISMS SEEN    Culture   Final    NO GROWTH 2 DAYS Performed at [facility], [place] [address], [place],  [address]    Report Status 05/22/2019 FINAL  Final    Assessment/Plan: S/P Procedure(s) (LRB): VIDEO ASSISTED THORACOSCOPY (Right) PLEURAL BIOPSY (Right) INSERTION PLEURAL  DRAINAGE CATHETER (Right)  1 hemodyn stable in sinus rhythm with some PVC's, one SBO reading in 70's , creat up a little, will reduce losartan dose,  Mg++ a little low, will replace. K+ a little low, replace to >4.0 2 sats good on RA 3 leukocytosis resolved, cultures unremarkable, not currently on abx, path pending 4 no air leak, CT 340 cc out yesterday, poss d/c tube soon 5 cont aggressive pulm toilet/RX 6 routine rehab, tx to floor 7 home soon  LOS: 8 days    John Giovanni PA-C 05/23/2019 Pager [phone]   pulmonary status better after VATS and drainage loculated effusion Path pending on biopsy Pleural plaque DC chest tube and send to stepdown Has Hx of PVCs and CABG Place face to face for home care R pleurx catheter  patient examined and medical record reviewed,agree with above note. Tharon Aquas Trigt III 05/23/2019

## 2019-05-23 NOTE — Progress Notes (Signed)
Dr. Marylyn Ishihara and Dr Nils Pyle made aware of patient, EKG, orders received, patient still ruddy in face warm to touch,  Denies Shortness of breath, cont with back discomfort. Encouraged patient to rest, orders received from MD's [facility] done

## 2019-05-23 NOTE — Progress Notes (Signed)
Spoke to Dr Radford Pax and Dr Lucianne Lei trite again concerning continuing heart rate increase, caling IV team for stat IV for CTA. Dr Radford Pax states to hold lovonox and metoprolol until CT done. She spoke to Dr Lucianne Lei trigt and Dr Roxan Hockey.

## 2019-05-23 NOTE — Progress Notes (Signed)
      [address]       [place] [address]             [phone]      Back pain after walk this afternoon  BP 113/62   Pulse (!) 118   Temp 98.5 F (36.9 C) (Oral)   Resp 16   Ht 6\' 1"  (1.854 m)   Wt 98 kg   SpO2 93%   BMI 28.50 kg/m   Now on 5L [place]  Cardiology consulted after ECG showed ST depression. Dr. Radford Pax just saw him. He is going down for a CT angio to r/o PE  Remo Lipps C. Roxan Hockey, MD [facility] [phone]

## 2019-05-23 NOTE — Progress Notes (Addendum)
RT obtain ABG on pt with the following results. RT will continue to monitor.   Results for Jeffrey [name], Jeffrey [name] (MRN [id number]) as of 05/23/2019 22:12  Ref. Range 05/23/2019 22:08  Sample type Unknown ARTERIAL  pH, Arterial Latest Ref Range: 7.350 - 7.450  7.467 (H)  pCO2 arterial Latest Ref Range: 32.0 - 48.0 mmHg 36.0  pO2, Arterial Latest Ref Range: 83.0 - 108.0 mmHg 81.0 (L)  TCO2 Latest Ref Range: 22 - 32 mmol/L 27  Acid-Base Excess Latest Ref Range: 0.0 - 2.0 mmol/L 2.0  Bicarbonate Latest Ref Range: 20.0 - 28.0 mmol/L 26.0  O2 Saturation Latest Units: % 97.0  Patient temperature Unknown 98.5 F  Collection site Unknown BRACHIAL ARTERY

## 2019-05-23 NOTE — Progress Notes (Signed)
ANTICOAGULATION CONSULT NOTE - Initial Consult  Pharmacy Consult for heparin  Indication:r/o PE and ACS  Allergies  Allergen Reactions  . Lipitor [Atorvastatin] Other (See Comments)    muscle spasms cramps  . Methylprednisolone Other (See Comments)    cramps cramps  . Other     If patient is to receive blood - blood must be treated witgh radiation because his body will not accept a normal infusion   . Sulfa Antibiotics Itching  . Doxycycline Rash    Doxycycline caused itching redness on scalp and head Doxycycline caused itching redness on scalp and head    Patient Measurements: Height: 6\' 1"  (185.4 cm) Weight: 216 lb 0.8 oz (98 kg) IBW/kg (Calculated) : 79.9  Vital Signs: Temp: 98.5 F (36.9 C) (06/01 1900) Temp Source: Oral (06/01 1900) BP: 113/62 (06/01 1100) Pulse Rate: 118 (06/01 1810)  Labs: Recent Labs    05/21/19 0559 05/22/19 0230 05/23/19 0338 05/23/19 1830  HGB 14.2 12.9* 12.4*  --   HCT 43.6 39.9 37.7*  --   PLT 266 227 243  --   CREATININE 1.23 1.11 1.27*  --   TROPONINI  --   --   --  0.06*    Estimated Creatinine Clearance: 55.2 mL/min (A) (by C-G formula based on SCr of 1.27 mg/dL (H)).   Medical History: Past Medical History:  Diagnosis Date  . Anxiety   . Aortic stenosis    mild AS by 07/20/13 echo ([facility])  . Arthritis    "all over"  . Coronary artery disease    NSTEMI 06/2010, CABG x3=> [place]->LAD, SVG->OM1, SVG->PDA, DES LCx 10/2011, DES LAD 12/2011  . Depression   . GERD (gastroesophageal reflux disease)   . H/O hiatal hernia   . Hearing aid worn    B/L  . High cholesterol   . History of blood transfusion reaction 03/03/13   "he almost died; he has to get irradiated blood next time"  . Hypertension   . Hypothyroidism   . Ischemic cardiomyopathy   . ITP (idiopathic thrombocytopenic purpura)    Dr. Gaynelle Arabian, on [place]  . Myocardial infarction ([place]) 03-Mar-2010  . Pneumonia 1940's X 1; 03/03/2014 X 1  . Rheumatoid  arthritis ([place])   . Shortness of breath    with exertion, has not been very active  . Sleep apnea   . Wears glasses   . Wears partial dentures     Medications:  Medications Prior to Admission  Medication Sig Dispense Refill Last Dose  . ALPRAZolam (XANAX) 0.25 MG tablet Take 1 tablet (0.25 mg total) by mouth 2 (two) times daily as needed for anxiety. 20 tablet 0 05/14/2019 at Unknown time  . aspirin EC 81 MG tablet Take 81 mg by mouth daily.   05/14/2019 at Unknown time  . azithromycin (ZITHROMAX) 250 MG tablet Take 250-500 mg by mouth See admin instructions. Starting on 05/12/2019 take 500mg  on day 1 then take 250mg  on days 2 through 5   05/14/2019 at Unknown time  . carbidopa-levodopa (SINEMET IR) 25-100 MG tablet Take 1 tablet by mouth 3 (three) times daily. 270 tablet 1 05/14/2019 at Unknown time  . Fluticasone-Salmeterol (ADVAIR) 100-50 MCG/DOSE AEPB Inhale 1 puff into the lungs daily.   05/14/2019 at Unknown time  . folic acid (FOLVITE) 1 MG tablet Take 1 mg by mouth daily.   05/14/2019 at Unknown time  . levothyroxine (SYNTHROID, LEVOTHROID) 200 MCG tablet Take 200 mcg by mouth at bedtime.  05/13/2019 at Unknown time  . losartan (COZAAR) 50 MG tablet Take 50 mg by mouth at bedtime.  8 05/13/2019 at Unknown time  . methotrexate (RHEUMATREX) 2.5 MG tablet Take 15 mg by mouth once a week. Caution:Chemotherapy. Protect from light.   Past Week at Unknown time  . Omega-3 Fatty Acids (FISH OIL) 1200 MG CAPS Take 1,200 mg by mouth at bedtime. 360 MG OMEGA-3   05/13/2019 at Unknown time  . omeprazole (PRILOSEC) 40 MG capsule Take 40 mg by mouth at bedtime.    05/13/2019 at Unknown time  . predniSONE (DELTASONE) 10 MG tablet Take 10 mg by mouth daily with breakfast.   05/14/2019 at Unknown time  . rOPINIRole (REQUIP) 0.5 MG tablet Take 0.5 mg by mouth at bedtime.   05/13/2019 at Unknown time  . tiZANidine (ZANAFLEX) 4 MG tablet Take 1 tablet (4 mg total) by mouth every 6 (six) hours as needed for muscle  spasms. (Patient taking differently: Take 4 mg by mouth at bedtime. ) 40 tablet 1 05/13/2019 at Unknown time  . Vitamin D, Ergocalciferol, (DRISDOL) 1.25 MG (50000 UT) CAPS capsule Take 50,000 Units by mouth every 7 (seven) days.   Past Week at Unknown time   Scheduled:  . acetaminophen  1,000 mg Oral Q6H   Or  . acetaminophen (TYLENOL) oral liquid 160 mg/5 mL  1,000 mg Oral Q6H  . aspirin EC  81 mg Oral Daily  . bisacodyl  10 mg Oral Daily  . carbidopa-levodopa  1 tablet Oral TID  . Chlorhexidine Gluconate Cloth  6 each Topical Daily  . fluticasone furoate-vilanterol  1 puff Inhalation Daily  . folic acid  1 mg Oral Daily  . levothyroxine  200 mcg Oral QHS  . magnesium oxide  400 mg Oral BID  . pantoprazole  40 mg Oral Daily  . predniSONE  10 mg Oral Q breakfast  . rOPINIRole  0.5 mg Oral QHS  . senna-docusate  1 tablet Oral QHS  . tiZANidine  4 mg Oral QHS    Assessment: 85 yo male with recurrent R pleural effusion s/p VATs on 5/19. He has an abnormal EKG and pharmacy consulted to dose heparin for r/o PE vs ACS. No heparin bolus per MD.  -hg= 12/4, plt= 242, chest tube output= 29ml in the last 24 hrs  Goal of Therapy:  Heparin level 0.3-0.7 units/ml Monitor platelets by anticoagulation protocol: Yes   Plan:  -no heparin bolus -Begin heparin at 1100 units/hr -Heparin level in 6 hours and daily wth CBC daily  Hildred Laser, PharmD Clinical Pharmacist **Pharmacist phone directory can now be found on [email or web address] ([email or web address]).  Listed under [place].

## 2019-05-23 NOTE — Progress Notes (Signed)
[name] [facility]  PROGRESS NOTE    Jeffrey [name]  PIR:[id number] DOB: 1934-03-22 DOA: 05/14/2019 PCP: [name], MD   Brief Narrative:   Jeffrey [name] an 85 y.o.malew CAD s/p CABG, ischemic CM, mild Aortic stenosis, Asthma (mild), OSA, Hypothyroidism, Parkinsons, Anxiety, remote hx of MRSE septic arthritis left knee s/p L knee ID/ synovectomy, L THA, Anemia/ ITP apparently presents with c/o cough with white->slight yellow sputum since January of this year. Pt notes dyspnea for the past 4 weeks. Worse over the past 3-4 day   Assessment & Plan:   Principal Problem:   Pleural effusion on right Active Problems:   CAD (coronary artery disease) of artery bypass graft   Hypothyroidism   PD (Parkinson's disease) (HCC)   Pancreatic lesion   Right pleural effusion - Sputum culture - Appreciate Pulmonary consult: s/p thoracentesis: concern for mesothelioma (if cytology negative the plan is to repeat thoro?) - has improved aeration in upper right lung - vanc and zosyn given x 1 in ER - echo: EF 55%, some impaired relaxation - pulm to consult CTS -s/pVATS; studies thus far negative  CAD s/p CABG, DES Lcx, DES LAD - Cont Aspirin 81mg  po qday,Losartan 50mg  po qday     - BP is a little soft; place hold parameters on losartan  Copd w/o exacerbation - Cont Breo 1puff qday and albuterol neb bid  H/o Rheumatoid arthritis - HOLD methotrexate - Cont Prednisone 10mg  po qday and Folic acid 1mg  po qday  Hypothyroidism - Cont Levothyroxine 265micrograms po qday  Anxiety - Xanax 0.25mg  po bid prn  Parkinson - Cont Sinemet IR 25/100mg  po tid  RLS - Cont Ropinirole 0.5mg  po qhs and Zanaflex 4mg  po qhs  Gerd - Cont Protonix  Pancreatic lesion - MRI as outpatient  Constipation - PRN miralax  Possible removal of CT tube tomorrow. Continue with pulm toileting. Possible home on Wednesday if continues to  progress. Continue as above.   DVT prophylaxis:SCDs Code Status:FULL Disposition Plan:TBD   Consultants:   PCCM  CTS  Procedures:   Thoracentesis  Right VATS  Subjective: "I can't get any sleep here."  Objective: Vitals:   05/23/19 0807 05/23/19 0900 05/23/19 1000 05/23/19 1100  BP:  100/67 110/60 113/62  Pulse:  66 (!) 58 60  Resp:  20 18 16   Temp:    (P) 97.7 F (36.5 C)  TempSrc:    (P) Oral  SpO2: 95% 96% 94% 93%  Weight:      Height:        Intake/Output Summary (Last 24 hours) at 05/23/2019 1314 Last data filed at 05/23/2019 1100 Gross per 24 hour  Intake 360 ml  Output 1955 ml  Net -1595 ml   Filed Weights   05/21/19 0600 05/22/19 0500 05/23/19 0600  Weight: 99.1 kg 98.9 kg 98 kg    Examination:  General exam:84 y.o.maleAppears calm and comfortable  Respiratory system:clear anteriors, rigth Ct noted Cardiovascular system:S1 &S2 heard, RRR. No JVD, murmurs, rubs, gallops or clicks. No pedal edema. Gastrointestinal system:Abdomen is nondistended, soft and nontender. No organomegaly or masses felt. Normal bowel sounds heard. Central nervous system:alert and oriented, no focal, follows comands Skin: No rashes, lesions or ulcers    Data Reviewed: I have personally reviewed following labs and imaging studies.  CBC: Recent Labs  Lab 05/19/19 0409 05/19/19 1112 05/20/19 0421 05/21/19 0420 05/21/19 0559 05/22/19 0230 05/23/19 0338  WBC 10.6* 8.8 11.0*  --  13.8* 14.3* 10.9*  NEUTROABS 6.1  --  6.4  --   --  10.2* 7.0  HGB 13.2 13.1 12.8* 13.3 14.2 12.9* 12.4*  HCT 39.4 39.3 38.3* 39.0 43.6 39.9 37.7*  MCV 93.1 93.3 94.3  --  95.6 95.0 93.5  PLT 268 263 254  --  266 227 728   Basic Metabolic Panel: Recent Labs  Lab 05/19/19 0409 05/19/19 1112 05/20/19 0421 05/21/19 0420 05/21/19 0559 05/22/19 0230 05/23/19 0338  NA 139 140 139 137 136 136 138  K 3.8 3.7 3.9 4.2 4.7 4.3 3.7  CL 104 106 105  --  100 103 105  CO2 28 28 25    --  25 26 25   GLUCOSE 87 127* 102*  --  121* 107* 107*  BUN 19 16 15   --  16 14 11   CREATININE 1.24 1.47* 1.22  --  1.23 1.11 1.27*  CALCIUM 9.1 9.0 9.1  --  9.2 8.8* 8.9  MG 1.9  --  1.9  --  1.7 1.7 1.8  PHOS 3.3  --  3.5  --   --   --  2.3*   GFR: Estimated Creatinine Clearance: 55.2 mL/min (A) (by C-G formula based on SCr of 1.27 mg/dL (H)). Liver Function Tests: Recent Labs  Lab 05/19/19 0409 05/19/19 1112 05/20/19 0421 05/22/19 0230 05/23/19 0338  AST  --  17  --  16  --   ALT  --  9  --  6  --   ALKPHOS  --  38  --  38  --   BILITOT  --  0.4  --  1.0  --   PROT  --  5.9*  --  5.6*  --   ALBUMIN 3.1* 3.0* 3.0* 2.6* 2.7*   No results for input(s): LIPASE, AMYLASE in the last 168 hours. No results for input(s): AMMONIA in the last 168 hours. Coagulation Profile: Recent Labs  Lab 05/19/19 1112  INR 1.1   Cardiac Enzymes: No results for input(s): CKTOTAL, CKMB, CKMBINDEX, TROPONINI in the last 168 hours. BNP (last 3 results) No results for input(s): PROBNP in the last 8760 hours. HbA1C: No results for input(s): HGBA1C in the last 72 hours. CBG: No results for input(s): GLUCAP in the last 168 hours. Lipid Profile: No results for input(s): CHOL, HDL, LDLCALC, TRIG, CHOLHDL, LDLDIRECT in the last 72 hours. Thyroid Function Tests: No results for input(s): TSH, T4TOTAL, FREET4, T3FREE, THYROIDAB in the last 72 hours. Anemia Panel: No results for input(s): VITAMINB12, FOLATE, FERRITIN, TIBC, IRON, RETICCTPCT in the last 72 hours. Sepsis Labs: No results for input(s): PROCALCITON, LATICACIDVEN in the last 168 hours.  Recent Results (from the past 240 hour(s))  SARS Coronavirus 2 (CEPHEID - Performed in [place] hospital lab), Hosp Order     Status: None   Collection Time: 05/14/19  6:40 PM  Result Value Ref Range Status   SARS Coronavirus 2 NEGATIVE NEGATIVE Final    Comment: (NOTE) If result is NEGATIVE SARS-CoV-2 target nucleic acids are NOT DETECTED. The  SARS-CoV-2 RNA is generally detectable in upper and lower  respiratory specimens during the acute phase of infection. The lowest  concentration of SARS-CoV-2 viral copies this assay can detect is 250  copies / mL. A negative result does not preclude SARS-CoV-2 infection  and should not be used as the sole basis for treatment or other  patient management decisions.  A negative result may occur with  improper specimen collection / handling, submission of specimen other  than nasopharyngeal swab, presence of viral  mutation(s) within the  areas targeted by this assay, and inadequate number of viral copies  (<250 copies / mL). A negative result must be combined with clinical  observations, patient history, and epidemiological information. If result is POSITIVE SARS-CoV-2 target nucleic acids are DETECTED. The SARS-CoV-2 RNA is generally detectable in upper and lower  respiratory specimens dur ing the acute phase of infection.  Positive  results are indicative of active infection with SARS-CoV-2.  Clinical  correlation with patient history and other diagnostic information is  necessary to determine patient infection status.  Positive results do  not rule out bacterial infection or co-infection with other viruses. If result is PRESUMPTIVE POSTIVE SARS-CoV-2 nucleic acids MAY BE PRESENT.   A presumptive positive result was obtained on the submitted specimen  and confirmed on repeat testing.  While 2019 novel coronavirus  (SARS-CoV-2) nucleic acids may be present in the submitted sample  additional confirmatory testing may be necessary for epidemiological  and / or clinical management purposes  to differentiate between  SARS-CoV-2 and other Sarbecovirus currently known to infect humans.  If clinically indicated additional testing with an alternate test  methodology [phone]) is advised. The SARS-CoV-2 RNA is generally  detectable in upper and lower respiratory sp ecimens during the acute   phase of infection. The expected result is Negative. Fact Sheet for Patients:  StrictlyIdeas.no Fact Sheet for Healthcare Providers: BankingDealers.co.za This test is not yet approved or cleared by the [place] FDA and has been authorized for detection and/or diagnosis of SARS-CoV-2 by FDA under an Emergency Use Authorization (EUA).  This EUA will remain in effect (meaning this test can be used) for the duration of the COVID-19 declaration under Section 564(b)(1) of the Act, 21 U.S.C. section 360bbb-3(b)(1), unless the authorization is terminated or revoked sooner. Performed at [facility], [address], [place], [place] [address]   Body fluid culture     Status: None   Collection Time: 05/16/19 12:48 PM  Result Value Ref Range Status   Specimen Description PLEURAL  Final   Special Requests NONE  Final   Gram Stain   Final    MODERATE WBC PRESENT,BOTH PMN AND MONONUCLEAR NO ORGANISMS SEEN    Culture   Final    NO GROWTH 3 DAYS Performed at [facility], [place] [address] [place], [place] [address]    Report Status 05/19/2019 FINAL  Final  Acid Fast Smear (AFB)     Status: None   Collection Time: 05/16/19 12:48 PM  Result Value Ref Range Status   AFB Specimen Processing Concentration  Final   Acid Fast Smear Negative  Final    Comment: (NOTE) Performed At: [facility] [place], [place] [id number] Rush Farmer MD [id number]    Source (AFB) FLUID  Final    Comment: PLEURAL Performed at [facility], [place] [address] [place], [place] [address]   Fungus Culture With Stain     Status: None (Preliminary result)   Collection Time: 05/16/19 12:48 PM  Result Value Ref Range Status   Fungus Stain Final report  Final    Comment: (NOTE) Performed At: [facility] [place], [place] [id number] Rush Farmer MD QB:[id number]    Fungus (Mycology) Culture PENDING   Incomplete   Fungal Source FLUID  Final    Comment: PLEURAL Performed at [facility], [place] [address] [place], [place] [address]   Fungus Culture Result     Status: None  Collection Time: 05/16/19 12:48 PM  Result Value Ref Range Status   Result 1 Comment  Final    Comment: (NOTE) KOH/Calcofluor preparation:  no fungus observed. Performed At: [facility] [place], [place] [id number] Rush Farmer MD YD:[id number]   Surgical pcr screen     Status: None   Collection Time: 05/18/19  5:38 PM  Result Value Ref Range Status   MRSA, PCR NEGATIVE NEGATIVE Final   Staphylococcus aureus NEGATIVE NEGATIVE Final    Comment: (NOTE) The Xpert SA Assay (FDA approved for NASAL specimens in patients 73 years of age and older), is one component of a comprehensive surveillance program. It is not intended to diagnose infection nor to guide or monitor treatment. Performed at [facility], [place] [address], [place], [place] [address]   Expectorated sputum assessment w rflx to resp cult     Status: None   Collection Time: 05/18/19  7:25 PM  Result Value Ref Range Status   Specimen Description EXPECTORATED SPUTUM  Final   Special Requests Normal  Final   Sputum evaluation   Final    THIS SPECIMEN IS ACCEPTABLE FOR SPUTUM CULTURE Performed at [facility], [place] [address] [place], [place] [address]    Report Status 05/18/2019 FINAL  Final  Culture, respiratory     Status: None   Collection Time: 05/18/19  7:25 PM  Result Value Ref Range Status   Specimen Description EXPECTORATED SPUTUM  Final   Special Requests Normal Reflexed from [id number]  Final   Gram Stain   Final    RARE WBC PRESENT,BOTH PMN AND MONONUCLEAR FEW GRAM POSITIVE COCCI RARE GRAM NEGATIVE RODS    Culture   Final    FEW Consistent with normal respiratory flora. Performed at [facility], [place] [address] [place], [place] [address]    Report Status 05/21/2019 FINAL  Final   Anaerobic culture     Status: None (Preliminary result)   Collection Time: 05/20/19  8:35 AM  Result Value Ref Range Status   Specimen Description PLEURAL  Final   Special Requests   Final    RIGHT Performed at [facility], [place] [address], [place], [place] [address]    Culture   Final    NO ANAEROBES ISOLATED; CULTURE IN PROGRESS FOR 5 DAYS   Report Status PENDING  Incomplete  Acid Fast Smear (AFB)     Status: None   Collection Time: 05/20/19  8:35 AM  Result Value Ref Range Status   AFB Specimen Processing Concentration  Final   Acid Fast Smear Negative  Final    Comment: (NOTE) Performed At: [facility] [place], [place] [id number] Rush Farmer MD TZ:[id number]    Source (AFB) PLEURAL  Final    Comment: Performed at [facility], [place] [address], [place], [place] [address]  Aerobic Culture (superficial specimen)     Status: None   Collection Time: 05/20/19  8:39 AM  Result Value Ref Range Status   Specimen Description PLEURAL  Final   Special Requests RIGHT  Final   Gram Stain   Final    FEW WBC PRESENT, PREDOMINANTLY PMN NO ORGANISMS SEEN    Culture   Final    NO GROWTH 2 DAYS Performed at [facility], [place] [address], [place], [place] [address]    Report Status 05/22/2019 FINAL  Final         Radiology Studies: Dg Chest  Port 1 View  Result Date: 05/23/2019 CLINICAL DATA:  85 year old male status post right side VATS with talc pleurodesis postoperative day 3. EXAM: PORTABLE CHEST 1 VIEW COMPARISON:  05/22/2019 and earlier. FINDINGS: Portable AP semi upright view at 0530 hours. Stable right chest tubes. No pneumothorax identified. Stable lung volumes and ventilation with patchy right lung base opacity. Stable cardiac size and mediastinal contours. Prior CABG. Paucity of bowel gas in the upper abdomen. No acute osseous abnormality identified. IMPRESSION: Stable chest tubes and postoperative appearance of the right chest. No  pneumothorax . Electronically Signed   By: Genevie Ann M.D.   On: 05/23/2019 06:35   Dg Chest Port 1 View  Result Date: 05/22/2019 CLINICAL DATA:  Chest tube in place EXAM: PORTABLE CHEST 1 VIEW COMPARISON:  05/20/2019 FINDINGS: Interval extubation. Medial right chest tube.  No pneumothorax is seen. Mild right basilar opacity, likely a combination of mildly complex pleural effusion and atelectasis. Left lung is clear. The heart is normal in size. Postsurgical changes related to prior CABG. Median sternotomy. IMPRESSION: Interval extubation. Stable right chest tube.  No pneumothorax is seen. Stable right basilar opacity, likely combination of atelectasis and complex pleural effusion. Electronically Signed   By: Julian Hy M.D.   On: 05/22/2019 06:46        Scheduled Meds:  acetaminophen  1,000 mg Oral Q6H   Or   acetaminophen (TYLENOL) oral liquid 160 mg/5 mL  1,000 mg Oral Q6H   aspirin EC  81 mg Oral Daily   bisacodyl  10 mg Oral Daily   carbidopa-levodopa  1 tablet Oral TID   Chlorhexidine Gluconate Cloth  6 each Topical Daily   fluticasone furoate-vilanterol  1 puff Inhalation Daily   folic acid  1 mg Oral Daily   levothyroxine  200 mcg Oral QHS   losartan  25 mg Oral QHS   magnesium oxide  400 mg Oral BID   pantoprazole  40 mg Oral Daily   potassium chloride  20 mEq Oral Q4H   predniSONE  10 mg Oral Q breakfast   rOPINIRole  0.5 mg Oral QHS   senna-docusate  1 tablet Oral QHS   tiZANidine  4 mg Oral QHS   Continuous Infusions:  sodium chloride 10 mL/hr at 05/22/19 0900   potassium chloride       LOS: 8 days    Time spent: 25 minutes spent in the coordination of care today.     Jonnie Finner, DO [facility] Pager [phone]  If 7PM-7AM, please contact night-coverage [email or web address] Password [email or web address] 05/23/2019, 1:14 PM

## 2019-05-24 ENCOUNTER — Inpatient Hospital Stay (HOSPITAL_COMMUNITY): Payer: Medicare Other

## 2019-05-24 DIAGNOSIS — I2583 Coronary atherosclerosis due to lipid rich plaque: Secondary | ICD-10-CM

## 2019-05-24 DIAGNOSIS — I25110 Atherosclerotic heart disease of native coronary artery with unstable angina pectoris: Secondary | ICD-10-CM

## 2019-05-24 DIAGNOSIS — I2510 Atherosclerotic heart disease of native coronary artery without angina pectoris: Secondary | ICD-10-CM

## 2019-05-24 DIAGNOSIS — R778 Other specified abnormalities of plasma proteins: Secondary | ICD-10-CM

## 2019-05-24 DIAGNOSIS — R0602 Shortness of breath: Secondary | ICD-10-CM

## 2019-05-24 LAB — URINALYSIS, ROUTINE W REFLEX MICROSCOPIC
Bilirubin Urine: NEGATIVE
Glucose, UA: NEGATIVE mg/dL
Ketones, ur: 5 mg/dL — AB
Nitrite: POSITIVE — AB
Protein, ur: 30 mg/dL — AB
Specific Gravity, Urine: 1.031 — ABNORMAL HIGH (ref 1.005–1.030)
pH: 5 (ref 5.0–8.0)

## 2019-05-24 LAB — CBC WITH DIFFERENTIAL/PLATELET
Abs Immature Granulocytes: 0.17 10*3/uL — ABNORMAL HIGH (ref 0.00–0.07)
Basophils Absolute: 0.1 10*3/uL (ref 0.0–0.1)
Basophils Relative: 0 %
Eosinophils Absolute: 0.1 10*3/uL (ref 0.0–0.5)
Eosinophils Relative: 0 %
HCT: 35.8 % — ABNORMAL LOW (ref 39.0–52.0)
Hemoglobin: 11.9 g/dL — ABNORMAL LOW (ref 13.0–17.0)
Immature Granulocytes: 1 %
Lymphocytes Relative: 11 %
Lymphs Abs: 3 10*3/uL (ref 0.7–4.0)
MCH: 31.5 pg (ref 26.0–34.0)
MCHC: 33.2 g/dL (ref 30.0–36.0)
MCV: 94.7 fL (ref 80.0–100.0)
Monocytes Absolute: 2.6 10*3/uL — ABNORMAL HIGH (ref 0.1–1.0)
Monocytes Relative: 10 %
Neutro Abs: 20 10*3/uL — ABNORMAL HIGH (ref 1.7–7.7)
Neutrophils Relative %: 78 %
Platelets: 234 10*3/uL (ref 150–400)
RBC: 3.78 MIL/uL — ABNORMAL LOW (ref 4.22–5.81)
RDW: 15.3 % (ref 11.5–15.5)
WBC: 25.8 10*3/uL — ABNORMAL HIGH (ref 4.0–10.5)
nRBC: 0 % (ref 0.0–0.2)

## 2019-05-24 LAB — BASIC METABOLIC PANEL
Anion gap: 10 (ref 5–15)
BUN: 16 mg/dL (ref 8–23)
CO2: 26 mmol/L (ref 22–32)
Calcium: 8.8 mg/dL — ABNORMAL LOW (ref 8.9–10.3)
Chloride: 102 mmol/L (ref 98–111)
Creatinine, Ser: 1.63 mg/dL — ABNORMAL HIGH (ref 0.61–1.24)
GFR calc Af Amer: 45 mL/min — ABNORMAL LOW (ref >60)
GFR calc non Af Amer: 39 mL/min — ABNORMAL LOW (ref >60)
Glucose, Bld: 111 mg/dL — ABNORMAL HIGH (ref 70–99)
Potassium: 4.5 mmol/L (ref 3.5–5.1)
Sodium: 138 mmol/L (ref 135–145)

## 2019-05-24 LAB — MAGNESIUM: Magnesium: 1.7 mg/dL (ref 1.7–2.4)

## 2019-05-24 LAB — TROPONIN I
Troponin I: 1.07 ng/mL (ref <0.03)
Troponin I: 0.95 ng/mL (ref <0.03)
Troponin I: 0.68 ng/mL (ref <0.03)

## 2019-05-24 LAB — POTASSIUM: Potassium: 3.5 mmol/L (ref 3.5–5.1)

## 2019-05-24 LAB — HEPARIN LEVEL (UNFRACTIONATED)
Heparin Unfractionated: 0.12 IU/mL — ABNORMAL LOW (ref 0.30–0.70)
Heparin Unfractionated: 0.47 IU/mL (ref 0.30–0.70)

## 2019-05-24 MED ORDER — POTASSIUM CHLORIDE 10 MEQ/100ML IV SOLN
10.0000 meq | Freq: Once | INTRAVENOUS | Status: AC
  Administered 2019-05-24: 10 meq via INTRAVENOUS

## 2019-05-24 MED ORDER — HEPARIN (PORCINE) 25000 UT/250ML-% IV SOLN
1400.0000 [IU]/h | INTRAVENOUS | Status: DC
  Administered 2019-05-24 – 2019-05-26 (×3): 1400 [IU]/h via INTRAVENOUS
  Filled 2019-05-24 (×3): qty 250

## 2019-05-24 MED ORDER — SODIUM CHLORIDE 0.9 % IV BOLUS
1000.0000 mL | Freq: Once | INTRAVENOUS | Status: AC
  Administered 2019-05-24: 03:00:00 1000 mL via INTRAVENOUS

## 2019-05-24 MED ORDER — SODIUM CHLORIDE 0.9 % IV BOLUS
250.0000 mL | Freq: Once | INTRAVENOUS | Status: AC
  Administered 2019-05-24: 250 mL via INTRAVENOUS

## 2019-05-24 MED ORDER — METOPROLOL TARTRATE 12.5 MG HALF TABLET: 12.5000 mg | ORAL_TABLET | Freq: Once | ORAL | Status: DC

## 2019-05-24 MED ORDER — POTASSIUM CHLORIDE 10 MEQ/100ML IV SOLN
INTRAVENOUS | Status: AC
  Administered 2019-05-24: 10 meq via INTRAVENOUS
  Filled 2019-05-24: qty 100

## 2019-05-24 NOTE — Progress Notes (Signed)
CT surgery p.m. Rounds  Patient feels better today Troponins are trending down Blood pressure more stable Discussed non-STEMI plan with Dr. Acie Fredrickson for coordination of care We will plan diagnostic catheterization prior to discharge and continue IV heparin 250 cc drainage from right Pleurx catheter today Plan continue daily Pleurx catheter drainage lobe

## 2019-05-24 NOTE — Progress Notes (Signed)
Progress Note  Patient Name: Jeffrey [name] Date of Encounter: 05/24/2019  Primary Cardiologist: Dr. Sabra Heck, [facility] Balo at [facility].   Subjective   85 yo with hx of CAD, s/p remote NSTEMI , hx of CABG in 2011  Hx of HTN, mild AS  Found to have mesothelioma, Had a VATS procedure several days ago  Was walking after procedure , complained of low back pain .  ECG showed marked ST depression  ECG today shows resolution of the ST depression.   Troponin peaked at 1.07.  Was 0.95 today at 1321   Pleasant,   Feeling better    Inpatient Medications    Scheduled Meds:  acetaminophen  1,000 mg Oral Q6H   Or   acetaminophen (TYLENOL) oral liquid 160 mg/5 mL  1,000 mg Oral Q6H   aspirin EC  81 mg Oral Daily   bisacodyl  10 mg Oral Daily   carbidopa-levodopa  1 tablet Oral TID   Chlorhexidine Gluconate Cloth  6 each Topical Daily   fluticasone furoate-vilanterol  1 puff Inhalation Daily   folic acid  1 mg Oral Daily   levothyroxine  200 mcg Oral QHS   magnesium oxide  400 mg Oral BID   pantoprazole  40 mg Oral Daily   predniSONE  10 mg Oral Q breakfast   rOPINIRole  0.5 mg Oral QHS   senna-docusate  1 tablet Oral QHS   tiZANidine  4 mg Oral QHS   Continuous Infusions:  sodium chloride Stopped (05/22/19 1058)   heparin 1,400 Units/hr (05/24/19 1600)   potassium chloride     PRN Meds: ALPRAZolam, ipratropium-albuterol, oxyCODONE, polyethylene glycol, potassium chloride, traMADol   Vital Signs    Vitals:   05/24/19 1400 05/24/19 1500 05/24/19 1600 05/24/19 1645  BP: (!) 111/56 (!) 92/51 100/62 118/75  Pulse: 70 68 68 75  Resp: 17 20 18 13   Temp:   97.6 F (36.4 C)   TempSrc:   Oral   SpO2: 97% 95% 93% 98%  Weight:      Height:        Intake/Output Summary (Last 24 hours) at 05/24/2019 1655 Last data filed at 05/24/2019 1600 Gross per 24 hour  Intake 1764.61 ml  Output 650 ml  Net 1114.61 ml   Last 3 Weights 05/24/2019 05/23/2019  05/22/2019  Weight (lbs) 220 lb 10.9 oz 216 lb 0.8 oz 218 lb 0.6 oz  Weight (kg) 100.1 kg 98 kg 98.9 kg      Telemetry    NSR 79 - Personally Reviewed  ECG     - Personally Reviewed  Physical Exam   GEN: No acute distress.  Elderly male  Neck: No JVD Cardiac: RRR, no murmurs, rubs, or gallops.  Respiratory: Clear to auscultation bilaterally.  pleuredex catheter in right lung posteriorly  GI: Soft, nontender, non-distended  MS: No edema; No deformity. Neuro:  Nonfocal  Psych: flat affect    Labs    Chemistry Recent Labs  Lab 05/19/19 1112 05/20/19 0421  05/22/19 0230 05/23/19 0338 05/23/19 2208 05/23/19 2240 05/24/19 0533  NA 140 139   < > 136 138 135  --  138  K 3.7 3.9   < > 4.3 3.7 3.3* 3.5 4.5  CL 106 105   < > 103 105  --   --  102  CO2 28 25   < > 26 25  --   --  26  GLUCOSE 127* 102*   < > 107*  107*  --   --  111*  BUN 16 15   < > 14 11  --   --  16  CREATININE 1.47* 1.22   < > 1.11 1.27*  --   --  1.63*  CALCIUM 9.0 9.1   < > 8.8* 8.9  --   --  8.8*  PROT 5.9*  --   --  5.6*  --   --   --   --   ALBUMIN 3.0* 3.0*  --  2.6* 2.7*  --   --   --   AST 17  --   --  16  --   --   --   --   ALT 9  --   --  6  --   --   --   --   ALKPHOS 38  --   --  38  --   --   --   --   BILITOT 0.4  --   --  1.0  --   --   --   --   GFRNONAA 44* 55*   < > >60 52*  --   --  39*  GFRAA 51* >60   < > >60 >60  --   --  45*  ANIONGAP 6 9   < > 7 8  --   --  10   < > = values in this interval not displayed.     Hematology Recent Labs  Lab 05/22/19 0230 05/23/19 0338 05/23/19 2208 05/24/19 0533  WBC 14.3* 10.9*  --  25.8*  RBC 4.20* 4.03*  --  3.78*  HGB 12.9* 12.4* 11.9* 11.9*  HCT 39.9 37.7* 35.0* 35.8*  MCV 95.0 93.5  --  94.7  MCH 30.7 30.8  --  31.5  MCHC 32.3 32.9  --  33.2  RDW 14.9 14.9  --  15.3  PLT 227 243  --  234    Cardiac Enzymes Recent Labs  Lab 05/23/19 1830 05/23/19 2226 05/24/19 0533 05/24/19 1321  TROPONINI 0.06* 0.16* 1.07* 0.95*   No  results for input(s): TROPIPOC in the last 168 hours.   BNPNo results for input(s): BNP, PROBNP in the last 168 hours.   DDimer No results for input(s): DDIMER in the last 168 hours.   Radiology    Ct Angio Chest Pe W Or Wo Contrast  Result Date: 05/23/2019 CLINICAL DATA:  Tachycardia.  Back pain. EXAM: CT ANGIOGRAPHY CHEST WITH CONTRAST TECHNIQUE: Multidetector CT imaging of the chest was performed using the standard protocol during bolus administration of intravenous contrast. Multiplanar CT image reconstructions and MIPs were obtained to evaluate the vascular anatomy. CONTRAST:  57mL OMNIPAQUE IOHEXOL 350 MG/ML SOLN COMPARISON:  CT dated 05/14/2019 FINDINGS: Cardiovascular: There is no PE identified. There are coronary artery calcifications. Atherosclerotic changes are noted of the thoracic aorta. There is no large pericardial effusion. Aortic calcifications are noted. The thoracic aorta measures up to 4.2 cm. Mediastinum/Nodes: No enlarged mediastinal, hilar, or axillary lymph nodes. Thyroid gland, trachea, and esophagus demonstrate no significant findings. Lungs/Pleura: There is been significant interval decrease in size of the previously noted right-sided pleural effusion. There is a small residual loculated right-sided hydropneumothorax. There is atelectasis involving the right lung base. Calcified pleural based plaques are again noted. There is atelectasis at the left lung base with a few calcified pleural based plaques. A right-sided chest tube is in place. Upper Abdomen: No acute abnormality. Musculoskeletal: No chest wall abnormality. No  acute or significant osseous findings. Review of the MIP images confirms the above findings. IMPRESSION: 1. No PE. 2. Significant interval decrease in size of the previously demonstrated right-sided pleural effusion. A right-sided chest tube is in place with a small residual right-sided hydropneumothorax. 3. Again identified are calcified pleural based plaques  bilaterally. 4. Additional chronic findings as previously described. Aortic Atherosclerosis (ICD10-I70.0). Electronically Signed   By: Constance Holster M.D.   On: 05/23/2019 21:31   Dg Chest Port 1 View  Result Date: 05/24/2019 CLINICAL DATA:  Status post chest tube placement EXAM: PORTABLE CHEST 1 VIEW COMPARISON:  05/23/2019 FINDINGS: Cardiac shadow is enlarged but stable. Postsurgical changes are noted. Right-sided pleural effusion is again seen with small PleurX catheter noted in place. No pneumothorax is seen. Left lung is clear. IMPRESSION: Right-sided pleural effusion stable from the prior exam. No new focal abnormality is seen. Electronically Signed   By: Inez Catalina M.D.   On: 05/24/2019 07:17   Dg Chest Port 1 View  Result Date: 05/23/2019 CLINICAL DATA:  Chest tube removal. EXAM: PORTABLE CHEST 1 VIEW COMPARISON:  Radiograph of same day. FINDINGS: Stable cardiomediastinal silhouette. Right-sided chest tube is unchanged in position. No pneumothorax is noted. Left lung is clear. Stable right basilar atelectasis is noted with probable minimal pleural effusion. IMPRESSION: Stable position of right-sided chest tube is noted. No pneumothorax is noted. Stable right basilar atelectasis is noted. Electronically Signed   By: Marijo Conception M.D.   On: 05/23/2019 18:44   Dg Chest Port 1 View  Result Date: 05/23/2019 CLINICAL DATA:  85 year old male status post right side VATS with talc pleurodesis postoperative day 3. EXAM: PORTABLE CHEST 1 VIEW COMPARISON:  05/22/2019 and earlier. FINDINGS: Portable AP semi upright view at 0530 hours. Stable right chest tubes. No pneumothorax identified. Stable lung volumes and ventilation with patchy right lung base opacity. Stable cardiac size and mediastinal contours. Prior CABG. Paucity of bowel gas in the upper abdomen. No acute osseous abnormality identified. IMPRESSION: Stable chest tubes and postoperative appearance of the right chest. No pneumothorax .  Electronically Signed   By: Genevie Ann M.D.   On: 05/23/2019 06:35    Cardiac Studies     Patient Profile     85 y.o. male with mesothelioma, CAD   Assessment & Plan    1.  CAD :   Has a hx of CABG, stenting.  Had and episode of CP associated with ST depression and a subsequent troponin of 1.07 while ambulating  He feels better now,   ST changes have resolved.  CT angio of the lung did not show any pulmonary emboli .   I agree that he will need a left heart cath .   He appears to be fairly stable for cath .   We can set up for tomorrow or perhaps Thursday        For questions or updates, please contact [place] Please consult [email or web address] for contact info under        Signed, Mertie Moores, MD  05/24/2019, 4:55 PM

## 2019-05-24 NOTE — Progress Notes (Signed)
[name]  PROGRESS NOTE    Jeffrey [name]  ELF:[id number] DOB: [birth year] DOA: 05/14/2019 PCP: [name], MD   Brief Narrative:   Jeffrey Jeffrey Pruittis an 85 y.o.malew CAD s/p CABG, ischemic CM, mild Aortic stenosis, Asthma (mild), OSA, Hypothyroidism, Parkinsons, Anxiety, remote hx of MRSE septic arthritis left knee s/p L knee ID/ synovectomy, L THA, Anemia/ ITP apparently presents with c/o cough with white->slight yellow sputum since January of this year. Pt notes dyspnea for the past 4 weeks. Worse over the past 3-4 day   Assessment & Plan:   Principal Problem:   Pleural effusion on right Active Problems:   CAD (coronary artery disease) of artery bypass graft   Hypothyroidism   PD (Parkinson's disease) (HCC)   Pancreatic lesion   Shortness of breath   Coronary artery disease due to lipid rich plaque   Elevated troponin   Right pleural effusion - Sputum culture - Appreciate Pulmonary consult: s/p thoracentesis: concern for mesothelioma (if cytology negative the plan is to repeat thoro?) - has improved aeration in upper right lung - vanc and zosyn given x 1 in ER - echo: EF 55%, some impaired relaxation - pulm to consult CTS -s/pVATS; studies negative to this point     - chest tube pulled 05/23/19  CAD s/p CABG, DES Lcx, DES LAD - Cont Aspirin 81mg  po qday     - hold losartan  Copd w/o exacerbation - Cont Breo 1puff qday and albuterol neb bid  H/o Rheumatoid arthritis - HOLD methotrexate - Cont Prednisone 10mg  po qday and Folic acid 1mg  po qday  Hypothyroidism - Cont Levothyroxine 212micrograms po qday  Anxiety - Xanax 0.25mg  po bid prn  Parkinson - Cont Sinemet IR 25/100mg  po tid  RLS - Cont Ropinirole 0.5mg  po qhs and Zanaflex 4mg  po qhs  Gerd - Cont Protonix  Pancreatic lesion - MRI as outpatient  Constipation - PRN miralax  Back pain     - history of disc problems per  pt     - treated with zanaflex, and did well until c/o increased pain last night     - reports that he feels better this AM; back is not TTP; abdominal exam is also negative, no neurological changes; however white count is elevated; reactive? Could consider MR lumbar spine  Leukocytosis     - WBC up, no fevers     - CXR, CT PE both ok     - Fluid studies negative     - check UA, Bld Cx  Elevated troponin/NSTEMI     - cardiology onboard     - on heparin gtt     - ischemic w/u pending?  Says he feels better this AM. No back pain. Both back and abdominal exam is benign. WBC up but no fever. Reactive? Check UA and Bld Cx as other cultures and Xray/Imaging is negative. Appreciate all consultants assistance.    DVT prophylaxis: SCDs Code Status: FULL   Disposition Plan: TBD   Consultants:   CTS  Cardiology  Procedures:   Thoracentesis  Right VATS   Subjective: "I feel better, sir."  Objective: Vitals:   05/24/19 0930 05/24/19 0945 05/24/19 1000 05/24/19 1005  BP: 109/66 (!) 96/59 (!) 88/42 (!) 85/43  Pulse: 63 74 (!) 46 74  Resp: 20 19 20  (!) 21  Temp:      TempSrc:      SpO2: 99% 99% 91% 94%  Weight:      Height:  Intake/Output Summary (Last 24 hours) at 05/24/2019 1038 Last data filed at 05/24/2019 0950 Gross per 24 hour  Intake 1546.57 ml  Output 1050 ml  Net 496.57 ml   Filed Weights   05/22/19 0500 05/23/19 0600 05/24/19 0630  Weight: 98.9 kg 98 kg 100.1 kg    Examination:  General: 85 y.o. male resting in bed in NAD, much more calm and interactive than last night Cardiovascular: brady, +S1, S2, no m/g/r, equal pulses throughout Respiratory: CTABL, no w/r/r, normal WOB GI: BS+, NDNT, no masses noted, no organomegaly noted MSK: No e/c/c, no back pain this AM Skin: No rashes, bruises, ulcerations noted Neuro: A&O x 3, no focal deficits     Data Reviewed: I have personally reviewed following labs and imaging studies.  CBC: Recent Labs  Lab  06/05/2019 0409  05/20/19 0421  05/21/19 0559 05/22/19 0230 05/23/19 0338 05/23/19 2208 05/24/19 0533  WBC 10.6*   < > 11.0*  --  13.8* 14.3* 10.9*  --  25.8*  NEUTROABS 6.1  --  6.4  --   --  10.2* 7.0  --  20.0*  HGB 13.2   < > 12.8*   < > 14.2 12.9* 12.4* 11.9* 11.9*  HCT 39.4   < > 38.3*   < > 43.6 39.9 37.7* 35.0* 35.8*  MCV 93.1   < > 94.3  --  95.6 95.0 93.5  --  94.7  PLT 268   < > 254  --  266 227 243  --  234   < > = values in this interval not displayed.   Basic Metabolic Panel: Recent Labs  Lab 2019-06-05 0409  05/20/19 0421  05/21/19 0559 05/22/19 0230 05/23/19 0338 05/23/19 2208 05/23/19 2240 05/24/19 0533  NA 139   < > 139   < > 136 136 138 135  --  138  K 3.8   < > 3.9   < > 4.7 4.3 3.7 3.3* 3.5 4.5  CL 104   < > 105  --  100 103 105  --   --  102  CO2 28   < > 25  --  25 26 25   --   --  26  GLUCOSE 87   < > 102*  --  121* 107* 107*  --   --  111*  BUN 19   < > 15  --  16 14 11   --   --  16  CREATININE 1.24   < > 1.22  --  1.23 1.11 1.27*  --   --  1.63*  CALCIUM 9.1   < > 9.1  --  9.2 8.8* 8.9  --   --  8.8*  MG 1.9  --  1.9  --  1.7 1.7 1.8  --   --  1.7  PHOS 3.3  --  3.5  --   --   --  2.3*  --   --   --    < > = values in this interval not displayed.   GFR: Estimated Creatinine Clearance: 43.5 mL/min (A) (by C-G formula based on SCr of 1.63 mg/dL (H)). Liver Function Tests: Recent Labs  Lab June 05, 2019 0409 June 05, 2019 1112 05/20/19 0421 05/22/19 0230 05/23/19 0338  AST  --  17  --  16  --   ALT  --  9  --  6  --   ALKPHOS  --  38  --  38  --   BILITOT  --  0.4  --  1.0  --   PROT  --  5.9*  --  5.6*  --   ALBUMIN 3.1* 3.0* 3.0* 2.6* 2.7*   No results for input(s): LIPASE, AMYLASE in the last 168 hours. No results for input(s): AMMONIA in the last 168 hours. Coagulation Profile: Recent Labs  Lab 05/19/19 1112  INR 1.1   Cardiac Enzymes: Recent Labs  Lab 05/23/19 1830 05/23/19 2226 05/24/19 0533  TROPONINI 0.06* 0.16* 1.07*   BNP (last  3 results) No results for input(s): PROBNP in the last 8760 hours. HbA1C: No results for input(s): HGBA1C in the last 72 hours. CBG: No results for input(s): GLUCAP in the last 168 hours. Lipid Profile: No results for input(s): CHOL, HDL, LDLCALC, TRIG, CHOLHDL, LDLDIRECT in the last 72 hours. Thyroid Function Tests: No results for input(s): TSH, T4TOTAL, FREET4, T3FREE, THYROIDAB in the last 72 hours. Anemia Panel: No results for input(s): VITAMINB12, FOLATE, FERRITIN, TIBC, IRON, RETICCTPCT in the last 72 hours. Sepsis Labs: No results for input(s): PROCALCITON, LATICACIDVEN in the last 168 hours.  Recent Results (from the past 240 hour(s))  SARS Coronavirus 2 (CEPHEID - Performed in [place] hospital lab), Hosp Order     Status: None   Collection Time: 05/14/19  6:40 PM  Result Value Ref Range Status   SARS Coronavirus 2 NEGATIVE NEGATIVE Final    Comment: (NOTE) If result is NEGATIVE SARS-CoV-2 target nucleic acids are NOT DETECTED. The SARS-CoV-2 RNA is generally detectable in upper and lower  respiratory specimens during the acute phase of infection. The lowest  concentration of SARS-CoV-2 viral copies this assay can detect is 250  copies / mL. A negative result does not preclude SARS-CoV-2 infection  and should not be used as the sole basis for treatment or other  patient management decisions.  A negative result may occur with  improper specimen collection / handling, submission of specimen other  than nasopharyngeal swab, presence of viral mutation(s) within the  areas targeted by this assay, and inadequate number of viral copies  (<250 copies / mL). A negative result must be combined with clinical  observations, patient history, and epidemiological information. If result is POSITIVE SARS-CoV-2 target nucleic acids are DETECTED. The SARS-CoV-2 RNA is generally detectable in upper and lower  respiratory specimens dur ing the acute phase of infection.  Positive   results are indicative of active infection with SARS-CoV-2.  Clinical  correlation with patient history and other diagnostic information is  necessary to determine patient infection status.  Positive results do  not rule out bacterial infection or co-infection with other viruses. If result is PRESUMPTIVE POSTIVE SARS-CoV-2 nucleic acids MAY BE PRESENT.   A presumptive positive result was obtained on the submitted specimen  and confirmed on repeat testing.  While 2019 novel coronavirus  (SARS-CoV-2) nucleic acids may be present in the submitted sample  additional confirmatory testing may be necessary for epidemiological  and / or clinical management purposes  to differentiate between  SARS-CoV-2 and other Sarbecovirus currently known to infect humans.  If clinically indicated additional testing with an alternate test  methodology [phone]) is advised. The SARS-CoV-2 RNA is generally  detectable in upper and lower respiratory sp ecimens during the acute  phase of infection. The expected result is Negative. Fact Sheet for Patients:  StrictlyIdeas.no Fact Sheet for Healthcare Providers: BankingDealers.co.za This test is not yet approved or cleared by the [place] FDA and has been authorized for detection and/or diagnosis of SARS-CoV-2 by  FDA under an Emergency Use Authorization (EUA).  This EUA will remain in effect (meaning this test can be used) for the duration of the COVID-19 declaration under Section 564(b)(1) of the Act, 21 U.S.C. section 360bbb-3(b)(1), unless the authorization is terminated or revoked sooner. Performed at [facility], [address], [place], [place] [address]   Body fluid culture     Status: None   Collection Time: 05/16/19 12:48 PM  Result Value Ref Range Status   Specimen Description PLEURAL  Final   Special Requests NONE  Final   Gram Stain   Final    MODERATE WBC PRESENT,BOTH PMN AND MONONUCLEAR NO  ORGANISMS SEEN    Culture   Final    NO GROWTH 3 DAYS Performed at [facility], [place] [address] [place], [place] [address]    Report Status 05/19/2019 FINAL  Final  Acid Fast Smear (AFB)     Status: None   Collection Time: 05/16/19 12:48 PM  Result Value Ref Range Status   AFB Specimen Processing Concentration  Final   Acid Fast Smear Negative  Final    Comment: (NOTE) Performed At: [facility] [place], [place] [id number] Rush Farmer MD IW:[id number]    Source (AFB) FLUID  Final    Comment: PLEURAL Performed at [facility], [place] [address] [place], [place] [address]   Fungus Culture With Stain     Status: None (Preliminary result)   Collection Time: 05/16/19 12:48 PM  Result Value Ref Range Status   Fungus Stain Final report  Final    Comment: (NOTE) Performed At: [facility] [place], [place] [id number] Rush Farmer MD JS:[id number]    Fungus (Mycology) Culture PENDING  Incomplete   Fungal Source FLUID  Final    Comment: PLEURAL Performed at [facility], [place] [address] [place], [place] [address]   Fungus Culture Result     Status: None   Collection Time: 05/16/19 12:48 PM  Result Value Ref Range Status   Result 1 Comment  Final    Comment: (NOTE) KOH/Calcofluor preparation:  no fungus observed. Performed At: [facility] [place], [place] [id number] Rush Farmer MD EH:[id number]   Surgical pcr screen     Status: None   Collection Time: 05/18/19  5:38 PM  Result Value Ref Range Status   MRSA, PCR NEGATIVE NEGATIVE Final   Staphylococcus aureus NEGATIVE NEGATIVE Final    Comment: (NOTE) The Xpert SA Assay (FDA approved for NASAL specimens in patients 110 years of age and older), is one component of a comprehensive surveillance program. It is not intended to diagnose infection nor to guide or monitor treatment. Performed at [facility], [place] [address]  [place], [place] [address]   Expectorated sputum assessment w rflx to resp cult     Status: None   Collection Time: 05/18/19  7:25 PM  Result Value Ref Range Status   Specimen Description EXPECTORATED SPUTUM  Final   Special Requests Normal  Final   Sputum evaluation   Final    THIS SPECIMEN IS ACCEPTABLE FOR SPUTUM CULTURE Performed at [facility], [place] [address] [place], [place] [address]    Report Status 05/18/2019 FINAL  Final  Culture, respiratory     Status: None   Collection Time: 05/18/19  7:25 PM  Result Value Ref Range Status   Specimen Description EXPECTORATED SPUTUM  Final   Special Requests Normal Reflexed from  [id number]  Final   Gram Stain   Final    RARE WBC PRESENT,BOTH PMN AND MONONUCLEAR FEW GRAM POSITIVE COCCI RARE GRAM NEGATIVE RODS    Culture   Final    FEW Consistent with normal respiratory flora. Performed at [facility], [place] [address] [place], [place] [address]    Report Status 05/21/2019 FINAL  Final  Anaerobic culture     Status: None (Preliminary result)   Collection Time: 05/20/19  8:35 AM  Result Value Ref Range Status   Specimen Description PLEURAL  Final   Special Requests   Final    RIGHT Performed at [facility], [place] [address], [place], [place] [address]    Culture   Final    NO ANAEROBES ISOLATED; CULTURE IN PROGRESS FOR 5 DAYS   Report Status PENDING  Incomplete  Fungus Culture With Stain     Status: None (Preliminary result)   Collection Time: 05/20/19  8:35 AM  Result Value Ref Range Status   Fungus Stain Final report  Final    Comment: (NOTE) Performed At: [facility] [place], [place] [id number] Rush Farmer MD OY:[id number]    Fungus (Mycology) Culture PENDING  Incomplete   Fungal Source PLEURAL  Final    Comment: Performed at [facility], [place] [address], [place], [place] [address]  Acid Fast Smear (AFB)     Status: None   Collection Time: 05/20/19  8:35 AM  Result Value Ref  Range Status   AFB Specimen Processing Concentration  Final   Acid Fast Smear Negative  Final    Comment: (NOTE) Performed At: [facility] [place], [place] [id number] Rush Farmer MD OQ:[id number]    Source (AFB) PLEURAL  Final    Comment: Performed at [facility], [place] [address] [place], [place] [address]  Fungus Culture Result     Status: None   Collection Time: 05/20/19  8:35 AM  Result Value Ref Range Status   Result 1 Comment  Final    Comment: (NOTE) KOH/Calcofluor preparation:  no fungus observed. Performed At: [facility] [place], [place] [id number] Rush Farmer MD FV:[id number]   Aerobic Culture (superficial specimen)     Status: None   Collection Time: 05/20/19  8:39 AM  Result Value Ref Range Status   Specimen Description PLEURAL  Final   Special Requests RIGHT  Final   Gram Stain   Final    FEW WBC PRESENT, PREDOMINANTLY PMN NO ORGANISMS SEEN    Culture   Final    NO GROWTH 2 DAYS Performed at [facility], [address], [place], [place] [address]    Report Status 05/22/2019 FINAL  Final         Radiology Studies: Ct Angio Chest Pe W Or Wo Contrast  Result Date: 05/23/2019 CLINICAL DATA:  Tachycardia.  Back pain. EXAM: CT ANGIOGRAPHY CHEST WITH CONTRAST TECHNIQUE: Multidetector CT imaging of the chest was performed using the standard protocol during bolus administration of intravenous contrast. Multiplanar CT image reconstructions and MIPs were obtained to evaluate the vascular anatomy. CONTRAST:  23mL OMNIPAQUE IOHEXOL 350 MG/ML SOLN COMPARISON:  CT dated 05/14/2019 FINDINGS: Cardiovascular: There is no PE identified. There are coronary artery calcifications. Atherosclerotic changes are noted of the thoracic aorta. There is no large pericardial effusion. Aortic calcifications are noted. The thoracic aorta measures up to 4.2 cm. Mediastinum/Nodes: No enlarged mediastinal, hilar, or axillary  lymph  nodes. Thyroid gland, trachea, and esophagus demonstrate no significant findings. Lungs/Pleura: There is been significant interval decrease in size of the previously noted right-sided pleural effusion. There is a small residual loculated right-sided hydropneumothorax. There is atelectasis involving the right lung base. Calcified pleural based plaques are again noted. There is atelectasis at the left lung base with a few calcified pleural based plaques. A right-sided chest tube is in place. Upper Abdomen: No acute abnormality. Musculoskeletal: No chest wall abnormality. No acute or significant osseous findings. Review of the MIP images confirms the above findings. IMPRESSION: 1. No PE. 2. Significant interval decrease in size of the previously demonstrated right-sided pleural effusion. A right-sided chest tube is in place with a small residual right-sided hydropneumothorax. 3. Again identified are calcified pleural based plaques bilaterally. 4. Additional chronic findings as previously described. Aortic Atherosclerosis (ICD10-I70.0). Electronically Signed   By: Constance Holster M.D.   On: 05/23/2019 21:31   Dg Chest Port 1 View  Result Date: 05/24/2019 CLINICAL DATA:  Status post chest tube placement EXAM: PORTABLE CHEST 1 VIEW COMPARISON:  05/23/2019 FINDINGS: Cardiac shadow is enlarged but stable. Postsurgical changes are noted. Right-sided pleural effusion is again seen with small PleurX catheter noted in place. No pneumothorax is seen. Left lung is clear. IMPRESSION: Right-sided pleural effusion stable from the prior exam. No new focal abnormality is seen. Electronically Signed   By: Inez Catalina M.D.   On: 05/24/2019 07:17   Dg Chest Port 1 View  Result Date: 05/23/2019 CLINICAL DATA:  Chest tube removal. EXAM: PORTABLE CHEST 1 VIEW COMPARISON:  Radiograph of same day. FINDINGS: Stable cardiomediastinal silhouette. Right-sided chest tube is unchanged in position. No pneumothorax is noted. Left lung  is clear. Stable right basilar atelectasis is noted with probable minimal pleural effusion. IMPRESSION: Stable position of right-sided chest tube is noted. No pneumothorax is noted. Stable right basilar atelectasis is noted. Electronically Signed   By: Marijo Conception M.D.   On: 05/23/2019 18:44   Dg Chest Port 1 View  Result Date: 05/23/2019 CLINICAL DATA:  85 year old male status post right side VATS with talc pleurodesis postoperative day 3. EXAM: PORTABLE CHEST 1 VIEW COMPARISON:  05/22/2019 and earlier. FINDINGS: Portable AP semi upright view at 0530 hours. Stable right chest tubes. No pneumothorax identified. Stable lung volumes and ventilation with patchy right lung base opacity. Stable cardiac size and mediastinal contours. Prior CABG. Paucity of bowel gas in the upper abdomen. No acute osseous abnormality identified. IMPRESSION: Stable chest tubes and postoperative appearance of the right chest. No pneumothorax . Electronically Signed   By: Genevie Ann M.D.   On: 05/23/2019 06:35        Scheduled Meds: . acetaminophen  1,000 mg Oral Q6H   Or  . acetaminophen (TYLENOL) oral liquid 160 mg/5 mL  1,000 mg Oral Q6H  . aspirin EC  81 mg Oral Daily  . bisacodyl  10 mg Oral Daily  . carbidopa-levodopa  1 tablet Oral TID  . Chlorhexidine Gluconate Cloth  6 each Topical Daily  . fluticasone furoate-vilanterol  1 puff Inhalation Daily  . folic acid  1 mg Oral Daily  . levothyroxine  200 mcg Oral QHS  . magnesium oxide  400 mg Oral BID  . pantoprazole  40 mg Oral Daily  . predniSONE  10 mg Oral Q breakfast  . rOPINIRole  0.5 mg Oral QHS  . senna-docusate  1 tablet Oral QHS  . tiZANidine  4 mg Oral QHS   Continuous  Infusions: . sodium chloride Stopped (05/22/19 1058)  . heparin 1,400 Units/hr (05/24/19 1006)  . potassium chloride       LOS: 9 days    Time spent: 35 minutes spent in the coordination of care today.    Jonnie Finner, DO [facility] Pager [phone]  If  7PM-7AM, please contact night-coverage [email or web address] Password [email or web address] 05/24/2019, 10:38 AM

## 2019-05-24 NOTE — Progress Notes (Addendum)
[place] for heparin  Indication:r/o PE and ACS  Allergies  Allergen Reactions  . Lipitor [Atorvastatin] Other (See Comments)    muscle spasms cramps  . Methylprednisolone Other (See Comments)    cramps cramps  . Other     If patient is to receive blood - blood must be treated witgh radiation because his body will not accept a normal infusion   . Sulfa Antibiotics Itching  . Doxycycline Rash    Doxycycline caused itching redness on scalp and head Doxycycline caused itching redness on scalp and head    Patient Measurements: Height: 6\' 1"  (185.4 cm) Weight: 220 lb 10.9 oz (100.1 kg) IBW/kg (Calculated) : 79.9  Vital Signs: Temp: 98.3 F (36.8 C) (06/02 1100) Temp Source: Oral (06/02 1100) BP: 111/56 (06/02 1400) Pulse Rate: 70 (06/02 1400)  Labs: Recent Labs    05/22/19 0230 05/23/19 0338 05/23/19 1830 05/23/19 2208 05/23/19 2226 05/24/19 0533 05/24/19 1321  HGB 12.9* 12.4*  --  11.9*  --  11.9*  --   HCT 39.9 37.7*  --  35.0*  --  35.8*  --   PLT 227 243  --   --   --  234  --   HEPARINUNFRC  --   --   --   --   --  0.12* 0.47  CREATININE 1.11 1.27*  --   --   --  1.63*  --   TROPONINI  --   --  0.06*  --  0.16* 1.07*  --     Estimated Creatinine Clearance: 43.5 mL/min (A) (by C-G formula based on SCr of 1.63 mg/dL (H)).    Assessment: 85 yo male with recurrent R pleural effusion s/p VATs on 5/19.  He has an abnormal EKG and pharmacy consulted to dose heparin for r/o PE vs ACS. No heparin bolus per MD.   Initial heparin level 0.12 units/ml, increased to 0.47 this afternoon. This is therapeutic. CBC stable today: Hgb 11.9 , hct 35.8, platelets 234  Goal of Therapy:  Heparin level 0.3-0.7 units/ml Monitor platelets by anticoagulation protocol: Yes   Plan:  Continue heparin at 1400 units / hr.  Heparin and CBC daily  Thank you for involving pharmacy in this patient's care.  Tamela Gammon, PharmD 05/24/2019 2:14  PM PGY-1 Pharmacy Resident Direct Phone: [phone] Please check [email or web address] for unit-specific pharmacist phone numbers

## 2019-05-24 NOTE — Progress Notes (Signed)
Called for atypical pain (back pain). Nursing had already obtained EKG that was computer interpreted as Acute MI. Personal review showed artifact and PVC that confused the interpretation. Repeat EKG was sinus tachy. However, I did consult STEMI team for review -- see cardiology note. They agreed that this was not an MI. I spoke with oncall/inhouse cardiology as well. CXR obtained was unchanged from previous CXR in AM. Exam showed spasm and tenderness in lumbar area. Patient denied dyspnea, CP. Agreed to his scheduled muscle relaxer but refused all pain meds. Ordered troponin trend. HR down to 110s and BP ok at end of review. He was much calmer at that time.  70 minutes spent in examination.

## 2019-05-24 NOTE — Progress Notes (Signed)
[place] for heparin  Indication:r/o PE and ACS  Allergies  Allergen Reactions  . Lipitor [Atorvastatin] Other (See Comments)    muscle spasms cramps  . Methylprednisolone Other (See Comments)    cramps cramps  . Other     If patient is to receive blood - blood must be treated witgh radiation because his body will not accept a normal infusion   . Sulfa Antibiotics Itching  . Doxycycline Rash    Doxycycline caused itching redness on scalp and head Doxycycline caused itching redness on scalp and head    Patient Measurements: Height: 6\' 1"  (185.4 cm) Weight: 216 lb 0.8 oz (98 kg) IBW/kg (Calculated) : 79.9  Vital Signs: Temp: 97.8 F (36.6 C) (06/02 0400) Temp Source: Oral (06/02 0400) BP: 93/56 (06/02 0600) Pulse Rate: 61 (06/02 0600)  Labs: Recent Labs    05/22/19 0230 05/23/19 0338 05/23/19 1830 05/23/19 2208 05/23/19 2226 05/24/19 0533  HGB 12.9* 12.4*  --  11.9*  --  11.9*  HCT 39.9 37.7*  --  35.0*  --  35.8*  PLT 227 243  --   --   --  234  HEPARINUNFRC  --   --   --   --   --  0.12*  CREATININE 1.11 1.27*  --   --   --   --   TROPONINI  --   --  0.06*  --  0.16*  --     Estimated Creatinine Clearance: 55.2 mL/min (A) (by C-G formula based on SCr of 1.27 mg/dL (H)).    Assessment: 85 yo male with recurrent R pleural effusion s/p VATs on 5/19. He has an abnormal EKG and pharmacy consulted to dose heparin for r/o PE vs ACS. No heparin bolus per MD.  -hg= 12/4, plt= 242, chest tube output= 238ml in the last 24 hrs  Initial heparin level 0.12 units/ml  Hg 11.9 Goal of Therapy:  Heparin level 0.3-0.7 units/ml Monitor platelets by anticoagulation protocol: Yes   Plan:  Increase heparin to 1400 units/hr -Heparin level in 6 hours and daily wth CBC daily  Thanks for allowing pharmacy to be a part of this patient's care.  Excell Seltzer, PharmD Clinical Pharmacist

## 2019-05-24 NOTE — Progress Notes (Addendum)
[facility] DAILY ICU PROGRESS NOTE                   [name]            [place] [address]          [phone]   4 Days Post-Op Procedure(s) (LRB): VIDEO ASSISTED THORACOSCOPY (Right) PLEURAL BIOPSY (Right) INSERTION PLEURAL DRAINAGE CATHETER (Right)  Total Length of Stay:  LOS: 9 days   Subjective:  Patient sleepy this morning.  Denies chest pain, shortness of breath.  He is oriented x 3  Objective: Vital signs in last 24 hours: Temp:  [96.4 F (35.8 C)-98.5 F (36.9 C)] 98.3 F (36.8 C) (06/02 0748) Pulse Rate:  [58-261] 65 (06/02 0800) Cardiac Rhythm: Normal sinus rhythm;Sinus bradycardia (06/01 2100) Resp:  [16-34] 18 (06/02 0800) BP: (67-113)/(42-69) 92/57 (06/02 0800) SpO2:  [93 %-100 %] 99 % (06/02 0800) Weight:  [100.1 kg] 100.1 kg (06/02 0630)  Filed Weights   05/22/19 0500 05/23/19 0600 05/24/19 0630  Weight: 98.9 kg 98 kg 100.1 kg    Weight change: 2.1 kg   Intake/Output from previous day: 06/01 0701 - 06/02 0700 In: 1505.8 [P.O.:240; I.V.:166.1; IV Piggyback:1099.8] Out: 850 [Urine:850]  Current Meds: Scheduled Meds:  acetaminophen  1,000 mg Oral Q6H   Or   acetaminophen (TYLENOL) oral liquid 160 mg/5 mL  1,000 mg Oral Q6H   aspirin EC  81 mg Oral Daily   bisacodyl  10 mg Oral Daily   carbidopa-levodopa  1 tablet Oral TID   Chlorhexidine Gluconate Cloth  6 each Topical Daily   fluticasone furoate-vilanterol  1 puff Inhalation Daily   folic acid  1 mg Oral Daily   levothyroxine  200 mcg Oral QHS   magnesium oxide  400 mg Oral BID   pantoprazole  40 mg Oral Daily   predniSONE  10 mg Oral Q breakfast   rOPINIRole  0.5 mg Oral QHS   senna-docusate  1 tablet Oral QHS   tiZANidine  4 mg Oral QHS   Continuous Infusions:  sodium chloride Stopped (05/22/19 1058)   heparin 1,400 Units/hr (05/24/19 0615)   potassium chloride     PRN Meds:.ALPRAZolam, ipratropium-albuterol, oxyCODONE, polyethylene glycol, potassium  chloride, traMADol  General appearance: sleepy, cooperative, no distress Heart: regular rate and rhythm Lungs: diminished breath sounds right base Abdomen: soft, non-tender; bowel sounds normal; no masses,  no organomegaly Wound: clean and dry  Lab Results: CBC: Recent Labs    05/23/19 0338 05/23/19 2208 05/24/19 0533  WBC 10.9*  --  25.8*  HGB 12.4* 11.9* 11.9*  HCT 37.7* 35.0* 35.8*  PLT 243  --  234   BMET:  Recent Labs    05/23/19 0338 05/23/19 2208 05/23/19 2240 05/24/19 0533  NA 138 135  --  138  K 3.7 3.3* 3.5 4.5  CL 105  --   --  102  CO2 25  --   --  26  GLUCOSE 107*  --   --  111*  BUN 11  --   --  16  CREATININE 1.27*  --   --  1.63*  CALCIUM 8.9  --   --  8.8*    CMET: Lab Results  Component Value Date   WBC 25.8 (H) 05/24/2019   HGB 11.9 (L) 05/24/2019   HCT 35.8 (L) 05/24/2019   PLT 234 05/24/2019   GLUCOSE 111 (H) 05/24/2019   ALT 6 05/22/2019   AST 16 05/22/2019  NA 138 05/24/2019   K 4.5 05/24/2019   CL 102 05/24/2019   CREATININE 1.63 (H) 05/24/2019   BUN 16 05/24/2019   CO2 26 05/24/2019   TSH 2.874 05/19/2019   INR 1.1 05/19/2019      PT/INR: No results for input(s): LABPROT, INR in the last 72 hours. Radiology: Ct Angio Chest Pe W Or Wo Contrast  Result Date: 05/23/2019 CLINICAL DATA:  Tachycardia.  Back pain. EXAM: CT ANGIOGRAPHY CHEST WITH CONTRAST TECHNIQUE: Multidetector CT imaging of the chest was performed using the standard protocol during bolus administration of intravenous contrast. Multiplanar CT image reconstructions and MIPs were obtained to evaluate the vascular anatomy. CONTRAST:  69mL OMNIPAQUE IOHEXOL 350 MG/ML SOLN COMPARISON:  CT dated 05/14/2019 FINDINGS: Cardiovascular: There is no PE identified. There are coronary artery calcifications. Atherosclerotic changes are noted of the thoracic aorta. There is no large pericardial effusion. Aortic calcifications are noted. The thoracic aorta measures up to 4.2 cm.  Mediastinum/Nodes: No enlarged mediastinal, hilar, or axillary lymph nodes. Thyroid gland, trachea, and esophagus demonstrate no significant findings. Lungs/Pleura: There is been significant interval decrease in size of the previously noted right-sided pleural effusion. There is a small residual loculated right-sided hydropneumothorax. There is atelectasis involving the right lung base. Calcified pleural based plaques are again noted. There is atelectasis at the left lung base with a few calcified pleural based plaques. A right-sided chest tube is in place. Upper Abdomen: No acute abnormality. Musculoskeletal: No chest wall abnormality. No acute or significant osseous findings. Review of the MIP images confirms the above findings. IMPRESSION: 1. No PE. 2. Significant interval decrease in size of the previously demonstrated right-sided pleural effusion. A right-sided chest tube is in place with a small residual right-sided hydropneumothorax. 3. Again identified are calcified pleural based plaques bilaterally. 4. Additional chronic findings as previously described. Aortic Atherosclerosis (ICD10-I70.0). Electronically Signed   By: Constance Holster M.D.   On: 05/23/2019 21:31   Dg Chest Port 1 View  Result Date: 05/24/2019 CLINICAL DATA:  Status post chest tube placement EXAM: PORTABLE CHEST 1 VIEW COMPARISON:  05/23/2019 FINDINGS: Cardiac shadow is enlarged but stable. Postsurgical changes are noted. Right-sided pleural effusion is again seen with small PleurX catheter noted in place. No pneumothorax is seen. Left lung is clear. IMPRESSION: Right-sided pleural effusion stable from the prior exam. No new focal abnormality is seen. Electronically Signed   By: Inez Catalina M.D.   On: 05/24/2019 07:17   Dg Chest Port 1 View  Result Date: 05/23/2019 CLINICAL DATA:  Chest tube removal. EXAM: PORTABLE CHEST 1 VIEW COMPARISON:  Radiograph of same day. FINDINGS: Stable cardiomediastinal silhouette. Right-sided chest  tube is unchanged in position. No pneumothorax is noted. Left lung is clear. Stable right basilar atelectasis is noted with probable minimal pleural effusion. IMPRESSION: Stable position of right-sided chest tube is noted. No pneumothorax is noted. Stable right basilar atelectasis is noted. Electronically Signed   By: Marijo Conception M.D.   On: 05/23/2019 18:44     Assessment/Plan: S/P Procedure(s) (LRB): VIDEO ASSISTED THORACOSCOPY (Right) PLEURAL BIOPSY (Right) INSERTION PLEURAL DRAINAGE CATHETER (Right)  1. CV- Sinus Bradycardia, developed ischemic symptoms yesterday, cardiology not felt to be a STEMI, CT ruled out PE.... Troponin is 1.07 this morning, will need further cardiology workup 2. Pulm- CT removed yesterday, on pneumothorax, CT scan shows improvement in previous right sided pleural fluid collection, wean oxygen as tolerated, continue IS 3. ID- OR cultures remain negative for 5 days, Pathology is pending  4. Dispo- patient with Troponin >1 today, denies chest pain, CT scan negative for PE... 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 Tharon Aquas Trigt III 05/24/2019

## 2019-05-24 NOTE — Plan of Care (Signed)
  Problem: Education: Goal: Knowledge of General Education information will improve Description Including pain rating scale, medication(s)/side effects and non-pharmacologic comfort measures Outcome: Progressing   Problem: Clinical Measurements: Goal: Diagnostic test results will improve Outcome: Not Progressing   Problem: Clinical Measurements: Goal: Cardiovascular complication will be avoided Outcome: Not Progressing   Problem: Activity: Goal: Risk for activity intolerance will decrease Outcome: Not Progressing   Problem: Pain Managment: Goal: General experience of comfort will improve Outcome: Not Progressing   Problem: Cardiac: Goal: Will achieve and/or maintain hemodynamic stability Outcome: Not Progressing

## 2019-05-24 NOTE — Plan of Care (Signed)
Progressing on all goals at this time.

## 2019-05-25 ENCOUNTER — Inpatient Hospital Stay (HOSPITAL_COMMUNITY): Payer: Medicare Other

## 2019-05-25 DIAGNOSIS — I25810 Atherosclerosis of coronary artery bypass graft(s) without angina pectoris: Secondary | ICD-10-CM

## 2019-05-25 LAB — RENAL FUNCTION PANEL
Albumin: 2.8 g/dL — ABNORMAL LOW (ref 3.5–5.0)
Anion gap: 10 (ref 5–15)
BUN: 19 mg/dL (ref 8–23)
CO2: 26 mmol/L (ref 22–32)
Calcium: 8.9 mg/dL (ref 8.9–10.3)
Chloride: 100 mmol/L (ref 98–111)
Creatinine, Ser: 1.26 mg/dL — ABNORMAL HIGH (ref 0.61–1.24)
GFR calc Af Amer: >60 mL/min (ref >60)
GFR calc non Af Amer: 53 mL/min — ABNORMAL LOW (ref >60)
Glucose, Bld: 116 mg/dL — ABNORMAL HIGH (ref 70–99)
Phosphorus: 3.4 mg/dL (ref 2.5–4.6)
Potassium: 3.9 mmol/L (ref 3.5–5.1)
Sodium: 136 mmol/L (ref 135–145)

## 2019-05-25 LAB — CBC WITH DIFFERENTIAL/PLATELET
Abs Immature Granulocytes: 0.14 10*3/uL — ABNORMAL HIGH (ref 0.00–0.07)
Basophils Absolute: 0.1 10*3/uL (ref 0.0–0.1)
Basophils Relative: 0 %
Eosinophils Absolute: 0.3 10*3/uL (ref 0.0–0.5)
Eosinophils Relative: 1 %
HCT: 36 % — ABNORMAL LOW (ref 39.0–52.0)
Hemoglobin: 11.9 g/dL — ABNORMAL LOW (ref 13.0–17.0)
Immature Granulocytes: 1 %
Lymphocytes Relative: 11 %
Lymphs Abs: 2.7 10*3/uL (ref 0.7–4.0)
MCH: 31.5 pg (ref 26.0–34.0)
MCHC: 33.1 g/dL (ref 30.0–36.0)
MCV: 95.2 fL (ref 80.0–100.0)
Monocytes Absolute: 2.6 10*3/uL — ABNORMAL HIGH (ref 0.1–1.0)
Monocytes Relative: 10 %
Neutro Abs: 19 10*3/uL — ABNORMAL HIGH (ref 1.7–7.7)
Neutrophils Relative %: 77 %
Platelets: 250 10*3/uL (ref 150–400)
RBC: 3.78 MIL/uL — ABNORMAL LOW (ref 4.22–5.81)
RDW: 15.3 % (ref 11.5–15.5)
WBC: 24.8 10*3/uL — ABNORMAL HIGH (ref 4.0–10.5)
nRBC: 0 % (ref 0.0–0.2)

## 2019-05-25 LAB — TROPONIN I: Troponin I: 0.6 ng/mL (ref <0.03)

## 2019-05-25 LAB — ANAEROBIC CULTURE

## 2019-05-25 LAB — MAGNESIUM: Magnesium: 1.9 mg/dL (ref 1.7–2.4)

## 2019-05-25 LAB — HEPARIN LEVEL (UNFRACTIONATED): Heparin Unfractionated: 0.41 IU/mL (ref 0.30–0.70)

## 2019-05-25 MED ORDER — SODIUM CHLORIDE 0.9% FLUSH
3.0000 mL | Freq: Two times a day (BID) | INTRAVENOUS | Status: DC
  Administered 2019-05-25 – 2019-05-26 (×2): 3 mL via INTRAVENOUS

## 2019-05-25 MED ORDER — SODIUM CHLORIDE 0.9 % WEIGHT BASED INFUSION
1.0000 mL/kg/h | INTRAVENOUS | Status: DC
  Administered 2019-05-26: 1 mL/kg/h via INTRAVENOUS

## 2019-05-25 MED ORDER — SODIUM CHLORIDE 0.9 % IV SOLN: 250.0000 mL | INTRAVENOUS | Status: DC | PRN

## 2019-05-25 MED ORDER — SODIUM CHLORIDE 0.9 % IV SOLN
1.0000 g | INTRAVENOUS | Status: DC
  Administered 2019-05-25 – 2019-05-26 (×2): 1 g via INTRAVENOUS
  Filled 2019-05-25 (×3): qty 10

## 2019-05-25 MED ORDER — SODIUM CHLORIDE 0.9% FLUSH: 3.0000 mL | INTRAVENOUS | Status: DC | PRN

## 2019-05-25 MED ORDER — SODIUM CHLORIDE 0.9 % WEIGHT BASED INFUSION
3.0000 mL/kg/h | INTRAVENOUS | Status: DC
  Administered 2019-05-26: 3 mL/kg/h via INTRAVENOUS

## 2019-05-25 MED ORDER — ASPIRIN 81 MG PO CHEW
81.0000 mg | CHEWABLE_TABLET | ORAL | Status: AC
  Administered 2019-05-26: 81 mg via ORAL
  Filled 2019-05-25: qty 1

## 2019-05-25 NOTE — Progress Notes (Signed)
PROGRESS NOTE    Jeffrey [name]  PFX:[id number] DOB: [birth year] DOA: 05/14/2019 PCP: [name], MD   Brief Narrative:  Jeffrey Mahany Pruittis an 85 y.o.malew CAD s/p CABG, ischemic CM, mild Aortic stenosis, Asthma (mild), OSA, Hypothyroidism, Parkinsons, Anxiety, remote hx of MRSE septic arthritis left knee s/p L knee ID/ synovectomy, L THA, Anemia/ ITP apparently presents with c/o cough with white->slight yellow sputum since January of this year. Pt notes dyspnea for the past 4 weeks. Worse over the past 3-4 day   Assessment & Plan:   Principal Problem:   Pleural effusion on right Active Problems:   CAD (coronary artery disease) of artery bypass graft   Hypothyroidism   PD (Parkinson's disease) (HCC)   Pancreatic lesion   Shortness of breath   Coronary artery disease due to lipid rich plaque   Elevated troponin   Coronary artery disease involving native coronary artery of native heart with unstable angina pectoris (HCC)   Right pleural effusion - s/p thoracentesis on 5/25 by pulm -> lymphocytic exudate with low glucose (thought unlikely rheumatoid effusion despite low glucose).  Cytology was negative.  Concern for mesothelioma given pleural plaque.   - s/p R VATS for drainage of loculated R pleural effusion, R pleural biopsies, talc pleurodesis of R pleural space, placement of R pleurx catheter on 5/29  - Pleural biopsy pending   - Recent CXR 6/3 with small R pleural effusion and R basilar atelecatasis - Daily pleurx drainage per CT surgery  - Echo with EF 55-60% - 5/25 body fluid cx ngtd, acid fast smear negative, fungal cx negative, acid fast cx pending  - 5/27 cx with normal resp flora - 5/29 acid fast negative, acid fast cx pending, anaerobic cx negative, fungal cx negative, aerobic cx ngtd - Blood cx 6/2 NGTD  NSTEMI  CAD s/p CABG, DES Lcx, DES LAD - episode of CP with ST depression and troponin to 1.07 while ambulating.   - appreciate cardiology recs -> CT PE  protocol without PE, plan for cath 6/4 - heparin gtt - continue asa  Copd w/o exacerbation - Cont Breo 1puff qday and albuterol neb bid  H/o Rheumatoid arthritis - HOLD methotrexate - Cont Prednisone 10mg  po qday and Folic acid 1mg  po qday  Hypothyroidism - Cont Levothyroxine 27micrograms po qday  Anxiety - Xanax 0.25mg  po bid prn  Parkinson - Cont Sinemet IR 25/100mg  po tid  RLS - Cont Ropinirole 0.5mg  po qhs and Zanaflex 4mg  po qhs  Gerd - Cont Protonix  Pancreatic lesion - MRI as outpatient  Constipation - PRN miralax  Back pain - history of disc problems per pt - no complaints of back pain today, continue to monitor, consider further imaging if recurrent or persistent   Leukocytosis  Urinary Tract Infection - afebrile, no clear source, possibly reactive in setting of nstemi above? Vs UTI? - UA with + nitrite and leukocytes.  Also with WBC's.  Foley on May 29, removed 31st.   - Pt symptomatic per nursing. - Follow urine cx - Follow blood cx - CXR as noted above - Will start ceftriaxone for now and continue to monitor  DVT prophylaxis: heparin Code Status: full  Family Communication: none at bedside Disposition Plan: pending   Consultants:   Cards  CT surgery  Procedures:  Echo IMPRESSIONS    1. The left ventricle has normal systolic function, with an ejection fraction of 55-60%. The cavity size was normal. Left ventricular diastolic Doppler parameters are consistent with impaired  relaxation.  2. The right ventricle has normal systolic function. The cavity was normal. There is no increase in right ventricular wall thickness.  3. The aortic valve is abnormal. Moderate thickening of the aortic valve. Moderate calcification of the aortic valve. Aortic valve regurgitation is mild by color flow Doppler. Mild stenosis of the aortic valve. Moderate aortic annular calcification  noted.  4. There is mild dilatation of the aortic root.  5. The  interatrial septum was not assessed.  LE [place] Summary: Right: There is no evidence of deep vein thrombosis in the lower extremity. However, portions of this examination were limited- see technologist comments above. No cystic structure found in the popliteal fossa. Left: There is no evidence of deep vein thrombosis in the lower extremity. No cystic structure found in the popliteal fossa.  Thoracentesis 5/25  s/p R VATS for drainage of loculated R pleural effusion, R pleural biopsies, talc pleurodesis of R pleural space, placement of R pleurx catheter on 5/29   Antimicrobials:  Anti-infectives (From admission, onward)   Start     Dose/Rate Route Frequency Ordered Stop   05/20/19 1600  ceFAZolin (ANCEF) IVPB 2g/100 mL premix     2 g 200 mL/hr over 30 Minutes Intravenous Every 8 hours 05/20/19 1227 05/21/19 0047   05/20/19 0600  ceFAZolin (ANCEF) IVPB 2g/100 mL premix     2 g 200 mL/hr over 30 Minutes Intravenous To Short Stay 05/19/19 1234 05/20/19 0823   05/15/19 0245  vancomycin (VANCOCIN) IVPB 1000 mg/200 mL premix     1,000 mg 200 mL/hr over 60 Minutes Intravenous  Once 05/15/19 0232 05/15/19 0746   05/15/19 0015  piperacillin-tazobactam (ZOSYN) IVPB 3.375 g     3.375 g 12.5 mL/hr over 240 Minutes Intravenous  Once 05/15/19 0008 05/15/19 0746   05/15/19 0015  vancomycin (VANCOCIN) IVPB 1000 mg/200 mL premix     1,000 mg 200 mL/hr over 60 Minutes Intravenous Every 1 hr x 2 05/15/19 0008 05/15/19 0221      Subjective: Feels ok.   No CP or SOB.   Objective: Vitals:   05/25/19 1300 05/25/19 1330 05/25/19 1400 05/25/19 1430  BP:    113/61  Pulse:  61 (!) 59 (!) 56  Resp: (!) 26 17 18 18   Temp:      TempSrc:      SpO2:  95% 94% 94%  Weight:      Height:        Intake/Output Summary (Last 24 hours) at 05/25/2019 1612 Last data filed at 05/25/2019 1547 Gross per 24 hour  Intake 907.29 ml  Output 1250 ml  Net -342.71 ml   Filed Weights   05/22/19 0500 05/23/19 0600  05/24/19 0630  Weight: 98.9 kg 98 kg 100.1 kg    Examination:  General exam: Appears calm and comfortable  Respiratory system: Clear to auscultation. Respiratory effort normal. Cardiovascular system: S1 & S2 heard, RRR. Gastrointestinal system: Abdomen is nondistended, soft and nontender. No organomegaly or masses felt. Normal bowel sounds heard. Central nervous system: Alert and oriented. No focal neurological deficits. Extremities: Symmetric 5 x 5 power. Skin: No rashes, lesions or ulcers Psychiatry: Judgement and insight appear normal. Mood & affect appropriate.     Data Reviewed: I have personally reviewed following labs and imaging studies  CBC: Recent Labs  Lab 05/20/19 0421  05/21/19 0559 05/22/19 0230 05/23/19 0338 05/23/19 2208 05/24/19 0533 05/25/19 0052  WBC 11.0*  --  13.8* 14.3* 10.9*  --  25.8* 24.8*  NEUTROABS 6.4  --   --  10.2* 7.0  --  20.0* 19.0*  HGB 12.8*   < > 14.2 12.9* 12.4* 11.9* 11.9* 11.9*  HCT 38.3*   < > 43.6 39.9 37.7* 35.0* 35.8* 36.0*  MCV 94.3  --  95.6 95.0 93.5  --  94.7 95.2  PLT 254  --  266 227 243  --  234 250   < > = values in this interval not displayed.   Basic Metabolic Panel: Recent Labs  Lab 05/19/19 0409  05/20/19 0421  05/21/19 0559 05/22/19 0230 05/23/19 9390 05/23/19 2208 05/23/19 2240 05/24/19 0533 05/25/19 0052  NA 139   < > 139   < > 136 136 138 135  --  138 136  K 3.8   < > 3.9   < > 4.7 4.3 3.7 3.3* 3.5 4.5 3.9  CL 104   < > 105  --  100 103 105  --   --  102 100  CO2 28   < > 25  --  25 26 25   --   --  26 26  GLUCOSE 87   < > 102*  --  121* 107* 107*  --   --  111* 116*  BUN 19   < > 15  --  16 14 11   --   --  16 19  CREATININE 1.24   < > 1.22  --  1.23 1.11 1.27*  --   --  1.63* 1.26*  CALCIUM 9.1   < > 9.1  --  9.2 8.8* 8.9  --   --  8.8* 8.9  MG 1.9  --  1.9  --  1.7 1.7 1.8  --   --  1.7 1.9  PHOS 3.3  --  3.5  --   --   --  2.3*  --   --   --  3.4   < > = values in this interval not displayed.    GFR: Estimated Creatinine Clearance: 56.3 mL/min (Kennie Karapetian) (by C-G formula based on SCr of 1.26 mg/dL (H)). Liver Function Tests: Recent Labs  Lab 05/19/19 1112 05/20/19 0421 05/22/19 0230 05/23/19 0338 05/25/19 0052  AST 17  --  16  --   --   ALT 9  --  6  --   --   ALKPHOS 38  --  38  --   --   BILITOT 0.4  --  1.0  --   --   PROT 5.9*  --  5.6*  --   --   ALBUMIN 3.0* 3.0* 2.6* 2.7* 2.8*   No results for input(s): LIPASE, AMYLASE in the last 168 hours. No results for input(s): AMMONIA in the last 168 hours. Coagulation Profile: Recent Labs  Lab 05/19/19 1112  INR 1.1   Cardiac Enzymes: Recent Labs  Lab 05/23/19 2226 05/24/19 0533 05/24/19 1321 05/24/19 1839 05/25/19 0052  TROPONINI 0.16* 1.07* 0.95* 0.68* 0.60*   BNP (last 3 results) No results for input(s): PROBNP in the last 8760 hours. HbA1C: No results for input(s): HGBA1C in the last 72 hours. CBG: No results for input(s): GLUCAP in the last 168 hours. Lipid Profile: No results for input(s): CHOL, HDL, LDLCALC, TRIG, CHOLHDL, LDLDIRECT in the last 72 hours. Thyroid Function Tests: No results for input(s): TSH, T4TOTAL, FREET4, T3FREE, THYROIDAB in the last 72 hours. Anemia Panel: No results for input(s): VITAMINB12, FOLATE, FERRITIN, TIBC, IRON, RETICCTPCT in the last 72 hours. Sepsis Labs: No  results for input(s): PROCALCITON, LATICACIDVEN in the last 168 hours.  Recent Results (from the past 240 hour(s))  Body fluid culture     Status: None   Collection Time: 05/16/19 12:48 PM  Result Value Ref Range Status   Specimen Description PLEURAL  Final   Special Requests NONE  Final   Gram Stain   Final    MODERATE WBC PRESENT,BOTH PMN AND MONONUCLEAR NO ORGANISMS SEEN    Culture   Final    NO GROWTH 3 DAYS Performed at [facility], [address], [place], [place] [address]    Report Status 05/19/2019 FINAL  Final  Acid Fast Smear (AFB)     Status: None   Collection Time: 05/16/19 12:48 PM   Result Value Ref Range Status   AFB Specimen Processing Concentration  Final   Acid Fast Smear Negative  Final    Comment: (NOTE) Performed At: [facility] [place], [place] [id number] Rush Farmer MD [id number]    Source (AFB) FLUID  Final    Comment: PLEURAL Performed at [facility], [place] [address] [place], [place] [address]   Fungus Culture With Stain     Status: None (Preliminary result)   Collection Time: 05/16/19 12:48 PM  Result Value Ref Range Status   Fungus Stain Final report  Final    Comment: (NOTE) Performed At: [facility] [address] [place], [place] [id number] Rush Farmer MD OJ:[id number]    Fungus (Mycology) Culture PENDING  Incomplete   Fungal Source FLUID  Final    Comment: PLEURAL Performed at [facility], [place] [address], [place], [place] [address]   Fungus Culture Result     Status: None   Collection Time: 05/16/19 12:48 PM  Result Value Ref Range Status   Result 1 Comment  Final    Comment: (NOTE) KOH/Calcofluor preparation:  no fungus observed. Performed At: [facility]: [place], [place] [id number] Rush Farmer MD FB:[id number]   Surgical pcr screen     Status: None   Collection Time: 05/18/19  5:38 PM  Result Value Ref Range Status   MRSA, PCR NEGATIVE NEGATIVE Final   Staphylococcus aureus NEGATIVE NEGATIVE Final    Comment: (NOTE) The Xpert SA Assay (FDA approved for NASAL specimens in patients 52 years of age and older), is one component of Rees Matura comprehensive surveillance program. It is not intended to diagnose infection nor to guide or monitor treatment. Performed at [facility], [place] [address], [place], [place] [address]   Expectorated sputum assessment w rflx to resp cult     Status: None   Collection Time: 05/18/19  7:25 PM  Result Value Ref Range Status   Specimen Description EXPECTORATED SPUTUM  Final   Special Requests Normal  Final   Sputum  evaluation   Final    THIS SPECIMEN IS ACCEPTABLE FOR SPUTUM CULTURE Performed at [facility], [place] [address] [place],  [address]    Report Status 05/18/2019 FINAL  Final  Culture, respiratory     Status: None   Collection Time: 05/18/19  7:25 PM  Result Value Ref Range Status   Specimen Description EXPECTORATED SPUTUM  Final   Special Requests Normal Reflexed from [id number]  Final   Gram Stain   Final    RARE WBC PRESENT,BOTH PMN AND MONONUCLEAR FEW GRAM POSITIVE COCCI RARE GRAM NEGATIVE RODS    Culture   Final    FEW Consistent  with normal respiratory flora. Performed at [facility], [place] [address] [place], [place] [address]    Report Status 05/21/2019 FINAL  Final  Anaerobic culture     Status: None   Collection Time: 05/20/19  8:35 AM  Result Value Ref Range Status   Specimen Description PLEURAL  Final   Special Requests RIGHT  Final   Culture   Final    NO ANAEROBES ISOLATED Performed at [facility], [place] [address] [place], [place] [address]    Report Status 05/25/2019 FINAL  Final  Fungus Culture With Stain     Status: None (Preliminary result)   Collection Time: 05/20/19  8:35 AM  Result Value Ref Range Status   Fungus Stain Final report  Final    Comment: (NOTE) Performed At: [facility] [place], [place] [id number] Rush Farmer MD AY:[id number]    Fungus (Mycology) Culture PENDING  Incomplete   Fungal Source PLEURAL  Final    Comment: Performed at [facility], [place] [address], [place], [place] [address]  Acid Fast Smear (AFB)     Status: None   Collection Time: 05/20/19  8:35 AM  Result Value Ref Range Status   AFB Specimen Processing Concentration  Final   Acid Fast Smear Negative  Final    Comment: (NOTE) Performed At: [facility] [place], [place] [id number] Rush Farmer MD WC:[id number]    Source (AFB) PLEURAL  Final    Comment: Performed at [facility],  [place] [address] [place], [place] [address]  Fungus Culture Result     Status: None   Collection Time: 05/20/19  8:35 AM  Result Value Ref Range Status   Result 1 Comment  Final    Comment: (NOTE) KOH/Calcofluor preparation:  no fungus observed. Performed At: [facility] [place], [place] [id number] Rush Farmer MD WN:[id number]   Aerobic Culture (superficial specimen)     Status: None   Collection Time: 05/20/19  8:39 AM  Result Value Ref Range Status   Specimen Description PLEURAL  Final   Special Requests RIGHT  Final   Gram Stain   Final    FEW WBC PRESENT, PREDOMINANTLY PMN NO ORGANISMS SEEN    Culture   Final    NO GROWTH 2 DAYS Performed at [facility], [address] [place], [place] [address]    Report Status 05/22/2019 FINAL  Final  Culture, blood (routine x 2)     Status: None (Preliminary result)   Collection Time: 05/24/19 11:26 AM  Result Value Ref Range Status   Specimen Description BLOOD LEFT HAND  Final   Special Requests   Final    BOTTLES DRAWN AEROBIC ONLY Blood Culture adequate volume   Culture   Final    NO GROWTH < 24 HOURS Performed at [facility], [place] [address], [place], [place] [address]    Report Status PENDING  Incomplete  Culture, blood (routine x 2)     Status: None (Preliminary result)   Collection Time: 05/24/19 11:27 AM  Result Value Ref Range Status   Specimen Description BLOOD LEFT HAND  Final   Special Requests   Final    BOTTLES DRAWN AEROBIC AND ANAEROBIC Blood Culture results may not be optimal due to an excessive volume of blood received in culture bottles   Culture   Final    NO GROWTH < 24 HOURS Performed at [facility], [address], [place], [place] [address]    Report Status PENDING  Incomplete         Radiology Studies: Ct Angio Chest Pe W Or Wo Contrast  Result Date: 05/23/2019 CLINICAL DATA:  Tachycardia.  Back pain. EXAM: CT ANGIOGRAPHY CHEST WITH CONTRAST TECHNIQUE:  Multidetector CT imaging of the chest was performed using the standard protocol during bolus administration of intravenous contrast. Multiplanar CT image reconstructions and MIPs were obtained to evaluate the vascular anatomy. CONTRAST:  39mL OMNIPAQUE IOHEXOL 350 MG/ML SOLN COMPARISON:  CT dated 05/14/2019 FINDINGS: Cardiovascular: There is no PE identified. There are coronary artery calcifications. Atherosclerotic changes are noted of the thoracic aorta. There is no large pericardial effusion. Aortic calcifications are noted. The thoracic aorta measures up to 4.2 cm. Mediastinum/Nodes: No enlarged mediastinal, hilar, or axillary lymph nodes. Thyroid gland, trachea, and esophagus demonstrate no significant findings. Lungs/Pleura: There is been significant interval decrease in size of the previously noted right-sided pleural effusion. There is Dailynn Nancarrow small residual loculated right-sided hydropneumothorax. There is atelectasis involving the right lung base. Calcified pleural based plaques are again noted. There is atelectasis at the left lung base with Taquana Bartley few calcified pleural based plaques. Sayde Lish right-sided chest tube is in place. Upper Abdomen: No acute abnormality. Musculoskeletal: No chest wall abnormality. No acute or significant osseous findings. Review of the MIP images confirms the above findings. IMPRESSION: 1. No PE. 2. Significant interval decrease in size of the previously demonstrated right-sided pleural effusion. Dorcus Riga right-sided chest tube is in place with Laurie Lovejoy small residual right-sided hydropneumothorax. 3. Again identified are calcified pleural based plaques bilaterally. 4. Additional chronic findings as previously described. Aortic Atherosclerosis (ICD10-I70.0). Electronically Signed   By: Constance Holster M.D.   On: 05/23/2019 21:31   Dg Chest Port 1 View  Result Date: 05/25/2019 CLINICAL DATA:  Recent VATS EXAM: PORTABLE CHEST 1 VIEW COMPARISON:  05/24/2019 FINDINGS: Cardiac shadow is stable. Postsurgical  changes are again seen. Small right pleural effusion is again noted and stable. Mild right basilar atelectasis is noted. No bony abnormality is noted. IMPRESSION: Small right pleural effusion and right basilar atelectasis. Electronically Signed   By: Inez Catalina M.D.   On: 05/25/2019 08:49   Dg Chest Port 1 View  Result Date: 05/24/2019 CLINICAL DATA:  Status post chest tube placement EXAM: PORTABLE CHEST 1 VIEW COMPARISON:  05/23/2019 FINDINGS: Cardiac shadow is enlarged but stable. Postsurgical changes are noted. Right-sided pleural effusion is again seen with small PleurX catheter noted in place. No pneumothorax is seen. Left lung is clear. IMPRESSION: Right-sided pleural effusion stable from the prior exam. No new focal abnormality is seen. Electronically Signed   By: Inez Catalina M.D.   On: 05/24/2019 07:17   Dg Chest Port 1 View  Result Date: 05/23/2019 CLINICAL DATA:  Chest tube removal. EXAM: PORTABLE CHEST 1 VIEW COMPARISON:  Radiograph of same day. FINDINGS: Stable cardiomediastinal silhouette. Right-sided chest tube is unchanged in position. No pneumothorax is noted. Left lung is clear. Stable right basilar atelectasis is noted with probable minimal pleural effusion. IMPRESSION: Stable position of right-sided chest tube is noted. No pneumothorax is noted. Stable right basilar atelectasis is noted. Electronically Signed   By: Marijo Conception M.D.   On: 05/23/2019 18:44        Scheduled Meds: . aspirin EC  81 mg Oral Daily  . bisacodyl  10 mg Oral Daily  . carbidopa-levodopa  1 tablet Oral TID  . Chlorhexidine Gluconate Cloth  6  each Topical Daily  . fluticasone furoate-vilanterol  1 puff Inhalation Daily  . folic acid  1 mg Oral Daily  . levothyroxine  200 mcg Oral QHS  . magnesium oxide  400 mg Oral BID  . pantoprazole  40 mg Oral Daily  . predniSONE  10 mg Oral Q breakfast  . rOPINIRole  0.5 mg Oral QHS  . senna-docusate  1 tablet Oral QHS  . tiZANidine  4 mg Oral QHS    Continuous Infusions: . sodium chloride Stopped (05/22/19 1058)  . heparin 1,400 Units/hr (05/25/19 1004)  . potassium chloride       LOS: 10 days    Time spent: over 30 min    Fayrene Helper, MD [facility] Pager AMION  If 7PM-7AM, please contact night-coverage [email or web address] Password [email or web address] 05/25/2019, 4:12 PM

## 2019-05-25 NOTE — Progress Notes (Addendum)
TCTS DAILY ICU PROGRESS NOTE                   [address]            [place] [id number]          [phone]   5 Days Post-Op Procedure(s) (LRB): VIDEO ASSISTED THORACOSCOPY (Right) PLEURAL BIOPSY (Right) INSERTION PLEURAL DRAINAGE CATHETER (Right)  Total Length of Stay:  LOS: 10 days   Subjective:  No new complaints.  Objective: Vital signs in last 24 hours: Temp:  [97.6 F (36.4 C)-98.6 F (37 C)] 97.8 F (36.6 C) (06/03 0816) Pulse Rate:  [46-79] 61 (06/03 0800) Cardiac Rhythm: Normal sinus rhythm;Sinus bradycardia (06/03 0800) Resp:  [13-22] 17 (06/03 0800) BP: (85-118)/(42-75) 98/52 (06/03 0800) SpO2:  [87 %-100 %] 97 % (06/03 0812)  Filed Weights   05/22/19 0500 05/23/19 0600 05/24/19 0630  Weight: 98.9 kg 98 kg 100.1 kg    Weight change:    Hemodynamic parameters for last 24 hours:    Intake/Output from previous day: 06/02 0701 - 06/03 0700 In: 936.2 [P.O.:600; I.V.:336.2] Out: 950 [Urine:700; Chest Tube:250]  Intake/Output this shift: Total I/O In: 13.5 [I.V.:13.5] Out: -   Current Meds: Scheduled Meds: . acetaminophen  1,000 mg Oral Q6H   Or  . acetaminophen (TYLENOL) oral liquid 160 mg/5 mL  1,000 mg Oral Q6H  . aspirin EC  81 mg Oral Daily  . bisacodyl  10 mg Oral Daily  . carbidopa-levodopa  1 tablet Oral TID  . Chlorhexidine Gluconate Cloth  6 each Topical Daily  . fluticasone furoate-vilanterol  1 puff Inhalation Daily  . folic acid  1 mg Oral Daily  . levothyroxine  200 mcg Oral QHS  . magnesium oxide  400 mg Oral BID  . pantoprazole  40 mg Oral Daily  . predniSONE  10 mg Oral Q breakfast  . rOPINIRole  0.5 mg Oral QHS  . senna-docusate  1 tablet Oral QHS  . tiZANidine  4 mg Oral QHS   Continuous Infusions: . sodium chloride Stopped (05/22/19 1058)  . heparin 1,400 Units/hr (05/25/19 0800)  . potassium chloride     PRN Meds:.ALPRAZolam, ipratropium-albuterol, oxyCODONE, polyethylene glycol, potassium  chloride, traMADol  General appearance: alert, cooperative and no distress Heart: regular rate and rhythm Lungs: diminished breath sounds right base Wound: clean and dry  Lab Results: CBC: Recent Labs    05/24/19 0533 05/25/19 0052  WBC 25.8* 24.8*  HGB 11.9* 11.9*  HCT 35.8* 36.0*  PLT 234 250   BMET:  Recent Labs    05/24/19 0533 05/25/19 0052  NA 138 136  K 4.5 3.9  CL 102 100  CO2 26 26  GLUCOSE 111* 116*  BUN 16 19  CREATININE 1.63* 1.26*  CALCIUM 8.8* 8.9    CMET: Lab Results  Component Value Date   WBC 24.8 (H) 05/25/2019   HGB 11.9 (L) 05/25/2019   HCT 36.0 (L) 05/25/2019   PLT 250 05/25/2019   GLUCOSE 116 (H) 05/25/2019   ALT 6 05/22/2019   AST 16 05/22/2019   NA 136 05/25/2019   K 3.9 05/25/2019   CL 100 05/25/2019   CREATININE 1.26 (H) 05/25/2019   BUN 19 05/25/2019   CO2 26 05/25/2019   TSH 2.874 05/19/2019   INR 1.1 05/19/2019      PT/INR: No results for input(s): LABPROT, INR in the last 72 hours. Radiology: No results found.   Assessment/Plan: S/P Procedure(s) (LRB): VIDEO  ASSISTED THORACOSCOPY (Right) PLEURAL BIOPSY (Right) INSERTION PLEURAL DRAINAGE CATHETER (Right)  1. CV-patient with elevated troponins, chest pain free, cardiology will perform cath prior to patient discharge 2. Pulm- start daily pleur-x drainage, CXR without pneumothorax, effusiono on right 3. Dispo- patient stable, pleurx drainage daily, care per primary, cardiology     Erin Barrett 05/25/2019 8:30 AM   Postop nonstemi on iv heparin Keep in ICU until cath patient examined and medical record reviewed,agree with above note. Tharon Aquas Trigt III 05/25/2019

## 2019-05-25 NOTE — Plan of Care (Signed)
Progressing on all goals at this time.

## 2019-05-25 NOTE — Progress Notes (Signed)
No acute changes overnight. BP remained stable. Heparin gtt continued.

## 2019-05-25 NOTE — Progress Notes (Signed)
[facility] for heparin  Indication:r/o PE and ACS  Allergies  Allergen Reactions  . Lipitor [Atorvastatin] Other (See Comments)    muscle spasms cramps  . Methylprednisolone Other (See Comments)    cramps cramps  . Other     If patient is to receive blood - blood must be treated witgh radiation because his body will not accept a normal infusion   . Sulfa Antibiotics Itching  . Doxycycline Rash    Doxycycline caused itching redness on scalp and head Doxycycline caused itching redness on scalp and head    Patient Measurements: Height: 6\' 1"  (185.4 cm) Weight: 220 lb 10.9 oz (100.1 kg) IBW/kg (Calculated) : 79.9  Vital Signs: Temp: 98.6 F (37 C) (06/03 0500) Temp Source: Oral (06/03 0500) BP: 101/56 (06/03 0700) Pulse Rate: 55 (06/03 0700)  Labs: Recent Labs    05/23/19 0338  05/23/19 2208  05/24/19 0533 05/24/19 1321 05/24/19 1839 05/25/19 0052  HGB 12.4*  --  11.9*  --  11.9*  --   --  11.9*  HCT 37.7*  --  35.0*  --  35.8*  --   --  36.0*  PLT 243  --   --   --  234  --   --  250  HEPARINUNFRC  --   --   --   --  0.12* 0.47  --  0.41  CREATININE 1.27*  --   --   --  1.63*  --   --  1.26*  TROPONINI  --    < >  --    < > 1.07* 0.95* 0.68* 0.60*   < > = values in this interval not displayed.    Estimated Creatinine Clearance: 56.3 mL/min (A) (by C-G formula based on SCr of 1.26 mg/dL (H)).    Assessment: 85 yo male with recurrent R pleural effusion s/p VATs on 5/19.  He has an abnormal EKG and pharmacy consulted to dose heparin for r/o PE vs ACS. No heparin bolus per MD. Heparin infusion not to exceed 1400 units / hr per [name].   Heparin level therapeutic at 0.41 units / ml this AM. No signs or symptoms of bleeding noted.   CBC stable today: Hgb 11.9 , hct 36, platelets 250.    Goal of Therapy:  Heparin level 0.3-0.7 units/ml Monitor platelets by anticoagulation protocol: Yes   Plan:  Continue heparin at  1400 units / hr.  Heparin and CBC daily  Thank you for involving pharmacy in this patient's care.  Tamela Gammon, PharmD 05/25/2019 7:27 AM PGY-1 Pharmacy Resident Direct Phone: [phone] Please check [email or web address] for unit-specific pharmacist phone numbers

## 2019-05-25 NOTE — Progress Notes (Signed)
Progress Note  Patient Name: Jeffrey [name] Date of Encounter: 05/25/2019  Primary Cardiologist: Dr. Sabra Heck, K[facility] Balo at [facility].   Subjective   85 yo with hx of CAD, s/p remote NSTEMI , hx of CABG in 2011  Hx of HTN, mild AS  Found to have mesothelioma, Had a VATS procedure several days ago  Was walking after procedure , complained of low back pain .  ECG showed marked ST depression  ECG today shows resolution of the ST depression.   Troponin peaked at 1.07.  Was 0.95 today at 1321   Doing well No angina      Inpatient Medications    Scheduled Meds: . acetaminophen  1,000 mg Oral Q6H   Or  . acetaminophen (TYLENOL) oral liquid 160 mg/5 mL  1,000 mg Oral Q6H  . aspirin EC  81 mg Oral Daily  . bisacodyl  10 mg Oral Daily  . carbidopa-levodopa  1 tablet Oral TID  . Chlorhexidine Gluconate Cloth  6 each Topical Daily  . fluticasone furoate-vilanterol  1 puff Inhalation Daily  . folic acid  1 mg Oral Daily  . levothyroxine  200 mcg Oral QHS  . magnesium oxide  400 mg Oral BID  . pantoprazole  40 mg Oral Daily  . predniSONE  10 mg Oral Q breakfast  . rOPINIRole  0.5 mg Oral QHS  . senna-docusate  1 tablet Oral QHS  . tiZANidine  4 mg Oral QHS   Continuous Infusions: . sodium chloride Stopped (05/22/19 1058)  . heparin 1,400 Units/hr (05/24/19 1800)  . potassium chloride     PRN Meds: ALPRAZolam, ipratropium-albuterol, oxyCODONE, polyethylene glycol, potassium chloride, traMADol   Vital Signs    Vitals:   05/25/19 0600 05/25/19 0630 05/25/19 0645 05/25/19 0700  BP: (!) 110/56 110/61 (!) 109/58 (!) 101/56  Pulse: (!) 56 64 67 (!) 55  Resp: 14 (!) 22 15 17   Temp:      TempSrc:      SpO2: 98% 94% 93% 91%  Weight:      Height:        Intake/Output Summary (Last 24 hours) at 05/25/2019 0749 Last data filed at 05/25/2019 0700 Gross per 24 hour  Intake 936.23 ml  Output 950 ml  Net -13.77 ml   Last 3 Weights 05/24/2019 05/23/2019 05/22/2019   Weight (lbs) 220 lb 10.9 oz 216 lb 0.8 oz 218 lb 0.6 oz  Weight (kg) 100.1 kg 98 kg 98.9 kg      Telemetry    NSR 79 - Personally Reviewed  ECG     - Personally Reviewed  Physical Exam   Physical Exam: Blood pressure (!) 101/56, pulse (!) 55, temperature 98.6 F (37 C), temperature source Oral, resp. rate 17, height 6\' 1"  (1.854 m), weight 100.1 kg, SpO2 91 %.  GEN:  Chronically ill appearing male ,  Moderately weak  HEENT: Normal NECK: No JVD; No carotid bruits LYMPHATICS: No lymphadenopathy CARDIAC: RR  RESPIRATORY:  pleurex catheter - right lung   ABDOMEN: Soft, non-tender, non-distended MUSCULOSKELETAL:  No edema; No deformity  SKIN: Warm and dry NEUROLOGIC:  Alert and oriented x 3   Labs    Chemistry Recent Labs  Lab 05/19/19 1112  05/22/19 0230 05/23/19 0338 05/23/19 2208 05/23/19 2240 05/24/19 0533 05/25/19 0052  NA 140   < > 136 138 135  --  138 136  K 3.7   < > 4.3 3.7 3.3* 3.5 4.5 3.9  CL 106   < >  103 105  --   --  102 100  CO2 28   < > 26 25  --   --  26 26  GLUCOSE 127*   < > 107* 107*  --   --  111* 116*  BUN 16   < > 14 11  --   --  16 19  CREATININE 1.47*   < > 1.11 1.27*  --   --  1.63* 1.26*  CALCIUM 9.0   < > 8.8* 8.9  --   --  8.8* 8.9  PROT 5.9*  --  5.6*  --   --   --   --   --   ALBUMIN 3.0*   < > 2.6* 2.7*  --   --   --  2.8*  AST 17  --  16  --   --   --   --   --   ALT 9  --  6  --   --   --   --   --   ALKPHOS 38  --  38  --   --   --   --   --   BILITOT 0.4  --  1.0  --   --   --   --   --   GFRNONAA 44*   < > >60 52*  --   --  39* 53*  GFRAA 51*   < > >60 >60  --   --  45* >60  ANIONGAP 6   < > 7 8  --   --  10 10   < > = values in this interval not displayed.     Hematology Recent Labs  Lab 05/23/19 0338 05/23/19 2208 05/24/19 0533 05/25/19 0052  WBC 10.9*  --  25.8* 24.8*  RBC 4.03*  --  3.78* 3.78*  HGB 12.4* 11.9* 11.9* 11.9*  HCT 37.7* 35.0* 35.8* 36.0*  MCV 93.5  --  94.7 95.2  MCH 30.8  --  31.5 31.5  MCHC  32.9  --  33.2 33.1  RDW 14.9  --  15.3 15.3  PLT 243  --  234 250    Cardiac Enzymes Recent Labs  Lab 05/24/19 0533 05/24/19 1321 05/24/19 1839 05/25/19 0052  TROPONINI 1.07* 0.95* 0.68* 0.60*   No results for input(s): TROPIPOC in the last 168 hours.   BNPNo results for input(s): BNP, PROBNP in the last 168 hours.   DDimer No results for input(s): DDIMER in the last 168 hours.   Radiology    Ct Angio Chest Pe W Or Wo Contrast  Result Date: 05/23/2019 CLINICAL DATA:  Tachycardia.  Back pain. EXAM: CT ANGIOGRAPHY CHEST WITH CONTRAST TECHNIQUE: Multidetector CT imaging of the chest was performed using the standard protocol during bolus administration of intravenous contrast. Multiplanar CT image reconstructions and MIPs were obtained to evaluate the vascular anatomy. CONTRAST:  69mL OMNIPAQUE IOHEXOL 350 MG/ML SOLN COMPARISON:  CT dated 05/14/2019 FINDINGS: Cardiovascular: There is no PE identified. There are coronary artery calcifications. Atherosclerotic changes are noted of the thoracic aorta. There is no large pericardial effusion. Aortic calcifications are noted. The thoracic aorta measures up to 4.2 cm. Mediastinum/Nodes: No enlarged mediastinal, hilar, or axillary lymph nodes. Thyroid gland, trachea, and esophagus demonstrate no significant findings. Lungs/Pleura: There is been significant interval decrease in size of the previously noted right-sided pleural effusion. There is a small residual loculated right-sided hydropneumothorax. There is atelectasis involving the right lung base. Calcified pleural based  plaques are again noted. There is atelectasis at the left lung base with a few calcified pleural based plaques. A right-sided chest tube is in place. Upper Abdomen: No acute abnormality. Musculoskeletal: No chest wall abnormality. No acute or significant osseous findings. Review of the MIP images confirms the above findings. IMPRESSION: 1. No PE. 2. Significant interval decrease in  size of the previously demonstrated right-sided pleural effusion. A right-sided chest tube is in place with a small residual right-sided hydropneumothorax. 3. Again identified are calcified pleural based plaques bilaterally. 4. Additional chronic findings as previously described. Aortic Atherosclerosis (ICD10-I70.0). Electronically Signed   By: Constance Holster M.D.   On: 05/23/2019 21:31   Dg Chest Port 1 View  Result Date: 05/24/2019 CLINICAL DATA:  Status post chest tube placement EXAM: PORTABLE CHEST 1 VIEW COMPARISON:  05/23/2019 FINDINGS: Cardiac shadow is enlarged but stable. Postsurgical changes are noted. Right-sided pleural effusion is again seen with small PleurX catheter noted in place. No pneumothorax is seen. Left lung is clear. IMPRESSION: Right-sided pleural effusion stable from the prior exam. No new focal abnormality is seen. Electronically Signed   By: Inez Catalina M.D.   On: 05/24/2019 07:17   Dg Chest Port 1 View  Result Date: 05/23/2019 CLINICAL DATA:  Chest tube removal. EXAM: PORTABLE CHEST 1 VIEW COMPARISON:  Radiograph of same day. FINDINGS: Stable cardiomediastinal silhouette. Right-sided chest tube is unchanged in position. No pneumothorax is noted. Left lung is clear. Stable right basilar atelectasis is noted with probable minimal pleural effusion. IMPRESSION: Stable position of right-sided chest tube is noted. No pneumothorax is noted. Stable right basilar atelectasis is noted. Electronically Signed   By: Marijo Conception M.D.   On: 05/23/2019 18:44    Cardiac Studies     Patient Profile     85 y.o. male with mesothelioma, CAD   Assessment & Plan    1.  CAD :   Has a hx of CABG, stenting.   He had symptoms of angina with ambulation associated with ST depression and troponin bump.   I agree that he will need a left heart cath .   He appears to be fairly stable for cath .     We have discussed risks, benefits, options. He understands and agrees to proceed.   Scheduled for tomorrow, 2nd case with Dr. Saunders Revel.  I have talked with [name].  He sees no issues with the pleurex catheter and the need for long term DAPT ( if he is stented. )   2.  Mesothelioma:   Plans per [name].      For questions or updates, please contact [facility] Please consult [email or web address] for contact info under        Signed, Mertie Moores, MD  05/25/2019, 7:49 AM

## 2019-05-26 ENCOUNTER — Encounter (HOSPITAL_COMMUNITY)
Admission: EM | Disposition: A | Discharge status: Home Health | Payer: Self-pay | Source: Home / Self Care | Attending: Internal Medicine

## 2019-05-26 ENCOUNTER — Inpatient Hospital Stay (HOSPITAL_COMMUNITY): Payer: Medicare Other

## 2019-05-26 DIAGNOSIS — I2510 Atherosclerotic heart disease of native coronary artery without angina pectoris: Secondary | ICD-10-CM

## 2019-05-26 HISTORY — PX: LEFT HEART CATH AND CORS/GRAFTS ANGIOGRAPHY: CATH118250

## 2019-05-26 HISTORY — PX: CORONARY STENT INTERVENTION: CATH118234

## 2019-05-26 LAB — COMPREHENSIVE METABOLIC PANEL
ALT: 14 U/L (ref 0–44)
AST: 31 U/L (ref 15–41)
Albumin: 2.6 g/dL — ABNORMAL LOW (ref 3.5–5.0)
Alkaline Phosphatase: 83 U/L (ref 38–126)
Anion gap: 11 (ref 5–15)
BUN: 13 mg/dL (ref 8–23)
CO2: 26 mmol/L (ref 22–32)
Calcium: 9.1 mg/dL (ref 8.9–10.3)
Chloride: 100 mmol/L (ref 98–111)
Creatinine, Ser: 1.25 mg/dL — ABNORMAL HIGH (ref 0.61–1.24)
GFR calc Af Amer: >60 mL/min (ref >60)
GFR calc non Af Amer: 53 mL/min — ABNORMAL LOW (ref >60)
Glucose, Bld: 118 mg/dL — ABNORMAL HIGH (ref 70–99)
Potassium: 3.6 mmol/L (ref 3.5–5.1)
Sodium: 137 mmol/L (ref 135–145)
Total Bilirubin: 0.8 mg/dL (ref 0.3–1.2)
Total Protein: 6 g/dL — ABNORMAL LOW (ref 6.5–8.1)

## 2019-05-26 LAB — CBC
HCT: 36.4 % — ABNORMAL LOW (ref 39.0–52.0)
Hemoglobin: 12.2 g/dL — ABNORMAL LOW (ref 13.0–17.0)
MCH: 31.6 pg (ref 26.0–34.0)
MCHC: 33.5 g/dL (ref 30.0–36.0)
MCV: 94.3 fL (ref 80.0–100.0)
Platelets: 258 10*3/uL (ref 150–400)
RBC: 3.86 MIL/uL — ABNORMAL LOW (ref 4.22–5.81)
RDW: 15.1 % (ref 11.5–15.5)
WBC: 16.2 10*3/uL — ABNORMAL HIGH (ref 4.0–10.5)
nRBC: 0 % (ref 0.0–0.2)

## 2019-05-26 LAB — POCT ACTIVATED CLOTTING TIME: Activated Clotting Time: 323 seconds

## 2019-05-26 LAB — MAGNESIUM: Magnesium: 1.9 mg/dL (ref 1.7–2.4)

## 2019-05-26 LAB — HEPARIN LEVEL (UNFRACTIONATED): Heparin Unfractionated: 0.36 IU/mL (ref 0.30–0.70)

## 2019-05-26 SURGERY — LEFT HEART CATH AND CORS/GRAFTS ANGIOGRAPHY
Anesthesia: LOCAL

## 2019-05-26 MED ORDER — SODIUM CHLORIDE 0.9% FLUSH: 3.0000 mL | INTRAVENOUS | Status: DC | PRN

## 2019-05-26 MED ORDER — FENTANYL CITRATE (PF) 100 MCG/2ML IJ SOLN
INTRAMUSCULAR | Status: AC
  Filled 2019-05-26: qty 2

## 2019-05-26 MED ORDER — TICAGRELOR 90 MG PO TABS
ORAL_TABLET | ORAL | Status: AC
  Filled 2019-05-26: qty 2

## 2019-05-26 MED ORDER — BIVALIRUDIN BOLUS VIA INFUSION - CUPID
INTRAVENOUS | Status: DC | PRN
  Administered 2019-05-26: 75.075 mg via INTRAVENOUS

## 2019-05-26 MED ORDER — MIDAZOLAM HCL 2 MG/2ML IJ SOLN
INTRAMUSCULAR | Status: DC | PRN
  Administered 2019-05-26 (×2): 0.5 mg via INTRAVENOUS

## 2019-05-26 MED ORDER — BIVALIRUDIN TRIFLUOROACETATE 250 MG IV SOLR
INTRAVENOUS | Status: AC
  Filled 2019-05-26: qty 250

## 2019-05-26 MED ORDER — HEPARIN (PORCINE) IN NACL 1000-0.9 UT/500ML-% IV SOLN
INTRAVENOUS | Status: AC
  Filled 2019-05-26: qty 1000

## 2019-05-26 MED ORDER — IOHEXOL 350 MG/ML SOLN
INTRAVENOUS | Status: DC | PRN
  Administered 2019-05-26: 205 mL via INTRA_ARTERIAL

## 2019-05-26 MED ORDER — POTASSIUM CHLORIDE CRYS ER 20 MEQ PO TBCR
30.0000 meq | EXTENDED_RELEASE_TABLET | Freq: Once | ORAL | Status: AC
  Administered 2019-05-26: 30 meq via ORAL
  Filled 2019-05-26: qty 1

## 2019-05-26 MED ORDER — SODIUM CHLORIDE 0.9 % IV SOLN: 250.0000 mL | INTRAVENOUS | Status: DC | PRN

## 2019-05-26 MED ORDER — LIDOCAINE HCL (PF) 1 % IJ SOLN
INTRAMUSCULAR | Status: AC
  Filled 2019-05-26: qty 30

## 2019-05-26 MED ORDER — MIDAZOLAM HCL 2 MG/2ML IJ SOLN
INTRAMUSCULAR | Status: AC
  Filled 2019-05-26: qty 2

## 2019-05-26 MED ORDER — HEPARIN SODIUM (PORCINE) 5000 UNIT/ML IJ SOLN
5000.0000 [IU] | Freq: Three times a day (TID) | INTRAMUSCULAR | Status: DC
  Administered 2019-05-27 – 2019-05-28 (×5): 5000 [IU] via SUBCUTANEOUS
  Filled 2019-05-26 (×5): qty 1

## 2019-05-26 MED ORDER — SODIUM CHLORIDE 0.9 % IV SOLN
INTRAVENOUS | Status: AC | PRN
  Administered 2019-05-26 (×2): 1.75 mg/kg/h via INTRAVENOUS

## 2019-05-26 MED ORDER — TICAGRELOR 90 MG PO TABS
ORAL_TABLET | ORAL | Status: DC | PRN
  Administered 2019-05-26: 180 mg via ORAL

## 2019-05-26 MED ORDER — NITROGLYCERIN 1 MG/10 ML FOR IR/CATH LAB
INTRA_ARTERIAL | Status: DC | PRN
  Administered 2019-05-26: 200 ug via INTRACORONARY

## 2019-05-26 MED ORDER — NITROGLYCERIN 1 MG/10 ML FOR IR/CATH LAB
INTRA_ARTERIAL | Status: AC
  Filled 2019-05-26: qty 10

## 2019-05-26 MED ORDER — LIDOCAINE HCL (PF) 1 % IJ SOLN
INTRAMUSCULAR | Status: DC | PRN
  Administered 2019-05-26: 20 mL via INTRADERMAL

## 2019-05-26 MED ORDER — TICAGRELOR 90 MG PO TABS
90.0000 mg | ORAL_TABLET | Freq: Two times a day (BID) | ORAL | Status: DC
  Administered 2019-05-27 – 2019-05-28 (×4): 90 mg via ORAL
  Filled 2019-05-26 (×4): qty 1

## 2019-05-26 MED ORDER — LABETALOL HCL 5 MG/ML IV SOLN: 10.0000 mg | INTRAVENOUS | Status: AC | PRN

## 2019-05-26 MED ORDER — SODIUM CHLORIDE 0.9% FLUSH
3.0000 mL | Freq: Two times a day (BID) | INTRAVENOUS | Status: DC
  Administered 2019-05-26 – 2019-05-28 (×4): 3 mL via INTRAVENOUS

## 2019-05-26 MED ORDER — SODIUM CHLORIDE 0.9 % IV SOLN
INTRAVENOUS | Status: AC
  Administered 2019-05-26: 19:00:00 via INTRAVENOUS

## 2019-05-26 MED ORDER — SODIUM CHLORIDE 0.9 % IV SOLN
1.7500 mg/kg/h | INTRAVENOUS | Status: AC
  Administered 2019-05-26: 1.75 mg/kg/h via INTRAVENOUS
  Filled 2019-05-26: qty 250

## 2019-05-26 MED ORDER — FENTANYL CITRATE (PF) 100 MCG/2ML IJ SOLN
INTRAMUSCULAR | Status: DC | PRN
  Administered 2019-05-26: 12.5 ug via INTRAVENOUS

## 2019-05-26 MED ORDER — HYDRALAZINE HCL 20 MG/ML IJ SOLN: 10.0000 mg | INTRAMUSCULAR | Status: AC | PRN

## 2019-05-26 MED ORDER — TRAZODONE HCL 50 MG PO TABS
50.0000 mg | ORAL_TABLET | Freq: Every day | ORAL | Status: DC
  Administered 2019-05-26 – 2019-05-27 (×2): 50 mg via ORAL
  Filled 2019-05-26 (×2): qty 1

## 2019-05-26 MED ORDER — HEPARIN (PORCINE) IN NACL 1000-0.9 UT/500ML-% IV SOLN
INTRAVENOUS | Status: AC
  Filled 2019-05-26: qty 500

## 2019-05-26 MED ORDER — HEPARIN (PORCINE) IN NACL 1000-0.9 UT/500ML-% IV SOLN
INTRAVENOUS | Status: DC | PRN
  Administered 2019-05-26 (×3): 500 mL

## 2019-05-26 SURGICAL SUPPLY — 25 items
BALLN EMERGE MR 2.5X15 (BALLOONS) ×2
BALLN SAPPHIRE ~~LOC~~ 3.0X15 (BALLOONS) ×2 IMPLANT
BALLOON EMERGE MR 2.5X15 (BALLOONS) ×1 IMPLANT
CATH INFINITI 5 FR IM (CATHETERS) ×2 IMPLANT
CATH INFINITI 5FR AL1 (CATHETERS) ×2 IMPLANT
CATH INFINITI 5FR MULTPACK ANG (CATHETERS) ×2 IMPLANT
CATH LAUNCHER 6FR EBU 3.75 (CATHETERS) ×2 IMPLANT
COVER DOME SNAP 22 D (MISCELLANEOUS) ×2 IMPLANT
GUIDEWIRE ANGLED .035X150CM (WIRE) ×2 IMPLANT
KIT ENCORE 26 ADVANTAGE (KITS) ×2 IMPLANT
KIT HEART LEFT (KITS) ×2 IMPLANT
KIT MICROPUNCTURE NIT STIFF (SHEATH) ×2 IMPLANT
PACK CARDIAC CATHETERIZATION (CUSTOM PROCEDURE TRAY) ×2 IMPLANT
PINNACLE LONG 5F 25CM (SHEATH) ×2
PINNACLE LONG 6F 25CM (SHEATH) ×2
SHEATH INTRO PINNACLE 6F 25CM (SHEATH) ×1 IMPLANT
SHEATH INTROD PINNACLE 5F 25CM (SHEATH) ×1 IMPLANT
SHEATH PINNACLE 5F 10CM (SHEATH) ×2 IMPLANT
SHEATH PINNACLE 6F 10CM (SHEATH) ×2 IMPLANT
STENT SYNERGY DES 2.75X20 (Permanent Stent) ×2 IMPLANT
TRANSDUCER W/STOPCOCK (MISCELLANEOUS) ×2 IMPLANT
TUBING CIL FLEX 10 FLL-RA (TUBING) ×2 IMPLANT
WIRE EMERALD 3MM-J .035X150CM (WIRE) ×2 IMPLANT
WIRE HI TORQ VERSACORE-J 145CM (WIRE) ×2 IMPLANT
WIRE MINAMO 190 (WIRE) ×2 IMPLANT

## 2019-05-26 NOTE — Brief Op Note (Signed)
BRIEF CARDIAC CATHETERIZATION NOTE  05/26/2019  3:48 PM  PATIENT:  Jeffrey [name]  85 y.o. male  PRE-OPERATIVE DIAGNOSIS:  NSTEMI  POST-OPERATIVE DIAGNOSIS:  NSTEMI  PROCEDURE:  Procedure(s): LEFT HEART CATH AND CORS/GRAFTS ANGIOGRAPHY (N/A)  SURGEON:  Surgeon(s) and Role:    * Gizella Belleville, Harrell Gave, MD - Primary  FINDINGS: 1. Severe native CAD with occluded ostial LAD and proximal non-dominant RCA, as well as 90% ramus, 90% proximal LCx (possible ISR), and CTO of OM1. 2. Widely patent LIMA-LAD. 3. Chronically occluded SVG-OM and SVG-LPDA. 4. Moderately elevated LVEDP. 5. Successful PCI to proximal LCx using Synergy 2.75 x 20 mm DES, post-dilated to 3.1 mm with 0% residual stenosis and TIMI-3 flow.  RECOMMENDATIONS: 1. DAPT with aspirin and ticagrelor for 3 months, after which aspirin and be stopped and ticagrelor continued for 9 additional months (if tolerated from a bleeding standpoint). 2. Continue secondary prevention. 3. Continue bivalirudin infusion x 2 hours, then stop (ticagrelor could not be given until Davyd Podgorski of the procedure due to concern for aspiration). 4. Remove right femoral artery sheath with manual compression 2 hours after discontinuation of bivalirudin.  Nelva Bush, MD [facility] Pager: [phone]

## 2019-05-26 NOTE — Progress Notes (Signed)
6 Days Post-Op Procedure(s) (LRB): VIDEO ASSISTED THORACOSCOPY (Right) PLEURAL BIOPSY (Right) INSERTION PLEURAL DRAINAGE CATHETER (Right) Subjective: No chest pain today Diagnostic cath pending by Dr.End Pathology on pleural biopsies discussed with Dr. Claudette Laws- is feeling is there is not mesothelioma but he will send the slides out for consultation to [facility] Pleurx catheter being drained daily, chest x-ray stable  Hope to send patient home if cardiac cath shows no pending problems Face-to-face orders placed for home health care of the Pleurx catheter and discharge instructions reviewed with patient    Objective: Vital signs in last 24 hours: Temp:  [97.6 F (36.4 C)-98.1 F (36.7 C)] 98.1 F (36.7 C) (06/04 0817) Pulse Rate:  [56-77] 57 (06/04 0900) Cardiac Rhythm: Sinus bradycardia;Normal sinus rhythm (06/04 0900) Resp:  [16-22] 20 (06/04 0900) BP: (103-144)/(58-77) 121/64 (06/04 0900) SpO2:  [87 %-99 %] 95 % (06/04 0900)  Hemodynamic parameters for last 24 hours:  Sinus rhythm, room air stable  Intake/Output from previous day: 06/03 0701 - 06/04 0700 In: 1430.6 [P.O.:360; I.V.:970.6; IV Piggyback:100] Out: 2525 [Urine:2375; Chest Tube:150] Intake/Output this shift: Total I/O In: 707.4 [I.V.:707.4] Out: 400 [Urine:400]       Exam    General- alert and comfortable    Neck- no JVD, no cervical adenopathy palpable, no carotid bruit   Lungs- clear without rales, wheezes   Cor- regular rate and rhythm, no murmur , gallop   Abdomen- soft, non-tender   Extremities - warm, non-tender, minimal edema   Neuro- oriented, appropriate, no focal weakness   Lab Results: Recent Labs    05/25/19 0052 05/26/19 0635  WBC 24.8* 16.2*  HGB 11.9* 12.2*  HCT 36.0* 36.4*  PLT 250 258   BMET:  Recent Labs    05/25/19 0052 05/26/19 0635  NA 136 137  K 3.9 3.6  CL 100 100  CO2 26 26  GLUCOSE 116* 118*  BUN 19 13  CREATININE 1.26* 1.25*  CALCIUM 8.9 9.1     PT/INR: No results for input(s): LABPROT, INR in the last 72 hours. ABG    Component Value Date/Time   PHART 7.467 (H) 05/23/2019 2208   HCO3 26.0 05/23/2019 2208   TCO2 27 05/23/2019 2208   ACIDBASEDEF 1.0 05/21/2019 0420   O2SAT 97.0 05/23/2019 2208   CBG (last 3)  No results for input(s): GLUCAP in the last 72 hours.  Assessment/Plan: S/P Procedure(s) (LRB): VIDEO ASSISTED THORACOSCOPY (Right) PLEURAL BIOPSY (Right) INSERTION PLEURAL DRAINAGE CATHETER (Right) Cardiac catheterization today for post VATS non-STEMI Continue daily Pleurx catheter drainage for recurrent pleural effusion Stop heparin after cardiac catheterization  LOS: 11 days    Tharon Aquas Trigt III 05/26/2019

## 2019-05-26 NOTE — Progress Notes (Signed)
Patient ID: Jeffrey [name], male   DOB: 02-08-1934, 85 y.o.   MRN: [id number] EVENING ROUNDS NOTE :     [address]       [place] [address]             [phone]                 Day of Surgery Procedure(s) (LRB): LEFT HEART CATH AND CORS/GRAFTS ANGIOGRAPHY (N/A) CORONARY STENT INTERVENTION (N/A)  Total Length of Stay:  LOS: 11 days  BP 139/73   Pulse 63   Temp 98.1 F (36.7 C) (Oral)   Resp 19   Ht 6\' 1"  (1.854 m)   Wt 100.1 kg   SpO2 97%   BMI 29.12 kg/m   .Intake/Output      06/03 0701 - 06/04 0700 06/04 0701 - 06/05 0700   P.O. 360    I.V. (mL/kg) 970.6 (9.7) 707.4 (7.1)   IV Piggyback 100    Total Intake(mL/kg) 1430.6 (14.3) 707.4 (7.1)   Urine (mL/kg/hr) 2375 (1) 1000 (0.9)   Stool     Chest Tube 150 150   Total Output 2525 1150   Net -1094.4 -442.6          . sodium chloride 10 mL/hr at 05/26/19 0200  . bivalirudin (ANGIOMAX) infusion 5 mg/mL (Cath Lab,ACS,PCI indication) 1.75 mg/kg/hr (05/26/19 1713)  . cefTRIAXone (ROCEPHIN)  IV Stopped (05/25/19 1752)  . potassium chloride       Lab Results  Component Value Date   WBC 16.2 (H) 05/26/2019   HGB 12.2 (L) 05/26/2019   HCT 36.4 (L) 05/26/2019   PLT 258 05/26/2019   GLUCOSE 118 (H) 05/26/2019   ALT 14 05/26/2019   AST 31 05/26/2019   NA 137 05/26/2019   K 3.6 05/26/2019   CL 100 05/26/2019   CREATININE 1.25 (H) 05/26/2019   BUN 13 05/26/2019   CO2 26 05/26/2019   TSH 2.874 05/19/2019   INR 1.1 05/19/2019   Stable with hr 60   Grace Isaac MD  Beeper [phone] Office [phone] 05/26/2019 5:56 PM

## 2019-05-26 NOTE — Progress Notes (Signed)
Progress Note  Patient Name: Jeffrey [name] Date of Encounter: 05/26/2019  Primary Cardiologist: Dr. Sabra Heck, Kermit Balo at [facility].   Subjective   85 yo with hx of CAD, s/p remote NSTEMI , hx of CABG in 2011  Hx of HTN, mild AS  Found to have mesothelioma, Had a VATS procedure several days ago  Was walking after procedure , complained of low back pain .  ECG showed marked ST depression  ECG today shows resolution of the ST depression.   Troponin peaked at 1.07.  Was 0.95 today at 1321   Continues to do well. No angina  K is 3.6  Will give him kdur 30 meq this am       Inpatient Medications    Scheduled Meds: . aspirin EC  81 mg Oral Daily  . bisacodyl  10 mg Oral Daily  . carbidopa-levodopa  1 tablet Oral TID  . Chlorhexidine Gluconate Cloth  6 each Topical Daily  . fluticasone furoate-vilanterol  1 puff Inhalation Daily  . folic acid  1 mg Oral Daily  . levothyroxine  200 mcg Oral QHS  . magnesium oxide  400 mg Oral BID  . pantoprazole  40 mg Oral Daily  . predniSONE  10 mg Oral Q breakfast  . rOPINIRole  0.5 mg Oral QHS  . senna-docusate  1 tablet Oral QHS  . sodium chloride flush  3 mL Intravenous Q12H  . tiZANidine  4 mg Oral QHS   Continuous Infusions: . sodium chloride 10 mL/hr at 05/26/19 0200  . sodium chloride    . sodium chloride 1 mL/kg/hr (05/26/19 0610)  . cefTRIAXone (ROCEPHIN)  IV Stopped (05/25/19 1752)  . heparin 1,400 Units/hr (05/26/19 0256)  . potassium chloride     PRN Meds: sodium chloride, ALPRAZolam, ipratropium-albuterol, oxyCODONE, polyethylene glycol, potassium chloride, sodium chloride flush, traMADol   Vital Signs    Vitals:   05/26/19 0640 05/26/19 0700 05/26/19 0746 05/26/19 0817  BP: 121/70 113/62    Pulse:  64    Resp: 19 (!) 22    Temp:    98.1 F (36.7 C)  TempSrc:    Oral  SpO2: 94% 92% 99%   Weight:      Height:        Intake/Output Summary (Last 24 hours) at 05/26/2019 0835 Last data filed at  05/26/2019 0700 Gross per 24 hour  Intake 1177.15 ml  Output 2525 ml  Net -1347.85 ml   Last 3 Weights 05/24/2019 05/23/2019 05/22/2019  Weight (lbs) 220 lb 10.9 oz 216 lb 0.8 oz 218 lb 0.6 oz  Weight (kg) 100.1 kg 98 kg 98.9 kg      Telemetry    NSR 79 - Personally Reviewed  ECG     - Personally Reviewed  Physical Exam    Physical Exam: Blood pressure 113/62, pulse 64, temperature 98.1 F (36.7 C), temperature source Oral, resp. rate (!) 22, height 6\' 1"  (1.854 m), weight 100.1 kg, SpO2 99 %.  GEN:  Elderly male, NAD  HEENT: Normal NECK: No JVD; No carotid bruits LYMPHATICS: No lymphadenopathy CARDIAC: RRR   RESPIRATORY:  Reduced breath sounds right side,  pleurex catheter right side  ABDOMEN: Soft, non-tender, non-distended MUSCULOSKELETAL:  1 + edema  SKIN: Warm and dry NEUROLOGIC:  Alert and oriented x 3   Labs    Chemistry Recent Labs  Lab 05/19/19 1112  05/22/19 0230 05/23/19 6378  05/24/19 0533 05/25/19 0052 05/26/19 0635  NA 140   < >  136 138   < > 138 136 137  K 3.7   < > 4.3 3.7   < > 4.5 3.9 3.6  CL 106   < > 103 105  --  102 100 100  CO2 28   < > 26 25  --  26 26 26   GLUCOSE 127*   < > 107* 107*  --  111* 116* 118*  BUN 16   < > 14 11  --  16 19 13   CREATININE 1.47*   < > 1.11 1.27*  --  1.63* 1.26* 1.25*  CALCIUM 9.0   < > 8.8* 8.9  --  8.8* 8.9 9.1  PROT 5.9*  --  5.6*  --   --   --   --  6.0*  ALBUMIN 3.0*   < > 2.6* 2.7*  --   --  2.8* 2.6*  AST 17  --  16  --   --   --   --  31  ALT 9  --  6  --   --   --   --  14  ALKPHOS 38  --  38  --   --   --   --  83  BILITOT 0.4  --  1.0  --   --   --   --  0.8  GFRNONAA 44*   < > >60 52*  --  39* 53* 53*  GFRAA 51*   < > >60 >60  --  45* >60 >60  ANIONGAP 6   < > 7 8  --  10 10 11    < > = values in this interval not displayed.     Hematology Recent Labs  Lab 05/24/19 0533 05/25/19 0052 05/26/19 0635  WBC 25.8* 24.8* 16.2*  RBC 3.78* 3.78* 3.86*  HGB 11.9* 11.9* 12.2*  HCT 35.8* 36.0*  36.4*  MCV 94.7 95.2 94.3  MCH 31.5 31.5 31.6  MCHC 33.2 33.1 33.5  RDW 15.3 15.3 15.1  PLT 234 250 258    Cardiac Enzymes Recent Labs  Lab 05/24/19 0533 05/24/19 1321 05/24/19 1839 05/25/19 0052  TROPONINI 1.07* 0.95* 0.68* 0.60*   No results for input(s): TROPIPOC in the last 168 hours.   BNPNo results for input(s): BNP, PROBNP in the last 168 hours.   DDimer No results for input(s): DDIMER in the last 168 hours.   Radiology    Dg Chest Port 1 View  Result Date: 05/26/2019 CLINICAL DATA:  Follow-up VATS EXAM: PORTABLE CHEST 1 VIEW COMPARISON:  05/25/2019 FINDINGS: Cardiac shadow is stable. Postsurgical changes are again noted. Small right pleural effusion and right basilar atelectasis are noted. PleurX catheter is noted on the right and stable. IMPRESSION: Stable right pleural effusion and basilar atelectasis. PleurX catheter is stable in appearance. Electronically Signed   By: Inez Catalina M.D.   On: 05/26/2019 07:54   Dg Chest Port 1 View  Result Date: 05/25/2019 CLINICAL DATA:  Recent VATS EXAM: PORTABLE CHEST 1 VIEW COMPARISON:  05/24/2019 FINDINGS: Cardiac shadow is stable. Postsurgical changes are again seen. Small right pleural effusion is again noted and stable. Mild right basilar atelectasis is noted. No bony abnormality is noted. IMPRESSION: Small right pleural effusion and right basilar atelectasis. Electronically Signed   By: Inez Catalina M.D.   On: 05/25/2019 08:49    Cardiac Studies     Patient Profile     85 y.o. male with mesothelioma, CAD   Assessment & Plan  1.  CAD :   Has a hx of CABG, stenting.   He had symptoms of angina with ambulation associated with ST depression and troponin bump.   No angina over night. For cath today   I have talked with [name].  He sees no issues with the pleurex catheter and the need for long term DAPT ( if he is stented. )   2.  Mesothelioma:   Plans per [name].      For questions or updates, please  contact [place] Please consult [email or web address] for contact info under        Signed, Mertie Moores, MD  05/26/2019, 8:35 AM

## 2019-05-26 NOTE — Progress Notes (Signed)
[facility] for heparin  Indication:r/o PE and ACS  Allergies  Allergen Reactions  . Lipitor [Atorvastatin] Other (See Comments)    muscle spasms cramps  . Methylprednisolone Other (See Comments)    cramps cramps  . Other     If patient is to receive blood - blood must be treated witgh radiation because his body will not accept a normal infusion   . Sulfa Antibiotics Itching  . Doxycycline Rash    Doxycycline caused itching redness on scalp and head Doxycycline caused itching redness on scalp and head    Patient Measurements: Height: 6\' 1"  (185.4 cm) Weight: 220 lb 10.9 oz (100.1 kg) IBW/kg (Calculated) : 79.9  Vital Signs: Temp: 98.1 F (36.7 C) (06/04 0817) Temp Source: Oral (06/04 0817) BP: 113/62 (06/04 0700) Pulse Rate: 64 (06/04 0700)  Labs: Recent Labs    05/24/19 0533 05/24/19 1321 05/24/19 1839 05/25/19 0052 05/26/19 0635  HGB 11.9*  --   --  11.9* 12.2*  HCT 35.8*  --   --  36.0* 36.4*  PLT 234  --   --  250 258  HEPARINUNFRC 0.12* 0.47  --  0.41 0.36  CREATININE 1.63*  --   --  1.26* 1.25*  TROPONINI 1.07* 0.95* 0.68* 0.60*  --     Estimated Creatinine Clearance: 56.7 mL/min (A) (by C-G formula based on SCr of 1.25 mg/dL (H)).    Assessment: 85 yo male with recurrent R pleural effusion s/p VATs on 5/19.  He has an abnormal EKG and pharmacy consulted to dose heparin for r/o PE vs ACS. No heparin bolus per MD. Heparin infusion not to exceed 1400 units / hr per [name].   Heparin level therapeutic at 0.36 units / ml this AM. No signs or symptoms of bleeding noted.   CBC stable today: Hgb 12.52 , hct 36.4, platelets 258.    Goal of Therapy:  Heparin level 0.3-0.7 units/ml Monitor platelets by anticoagulation protocol: Yes   Plan:  Continue heparin at 1400 units / hr.  Heparin and CBC daily  Thank you for involving pharmacy in this patient's care.  Tamela Gammon, PharmD 05/26/2019 8:40 AM PGY-1  Pharmacy Resident Direct Phone: [phone] Please check [email or web address] for unit-specific pharmacist phone numbers

## 2019-05-26 NOTE — Progress Notes (Signed)
PROGRESS NOTE    Jeffrey [name]  SEG:[id number] DOB: 1934/06/04 DOA: 05/14/2019 PCP: [name], MD   Brief Narrative:  Jeffrey [name] an 85 y.o.malew CAD s/p CABG, ischemic CM, mild Aortic stenosis, Asthma (mild), OSA, Hypothyroidism, Parkinsons, Anxiety, remote hx of MRSE septic arthritis left knee s/p L knee ID/ synovectomy, L THA, Anemia/ ITP apparently presents with c/o cough with white->slight yellow sputum since January of this year. Pt notes dyspnea for the past 4 weeks. Worse over the past 3-4 day   Assessment & Plan:   Principal Problem:   Pleural effusion on right Active Problems:   CAD (coronary artery disease) of artery bypass graft   Hypothyroidism   PD (Parkinson's disease) (HCC)   Pancreatic lesion   Shortness of breath   Coronary artery disease due to lipid rich plaque   Elevated troponin   Coronary artery disease involving native coronary artery of native heart with unstable angina pectoris (HCC)   Right pleural effusion - s/p thoracentesis on 5/25 by pulm -> lymphocytic exudate with low glucose (thought unlikely rheumatoid effusion despite low glucose).  Cytology was negative.  Concern for mesothelioma given pleural plaque.   - s/p R VATS for drainage of loculated R pleural effusion, R pleural biopsies, talc pleurodesis of R pleural space, placement of R pleurx catheter on 5/29  - Pleural biopsy pending   - Recent CXR 6/3 with small R pleural effusion and R basilar atelecatasis - Daily pleurx drainage per CT surgery  - Echo with EF 55-60% - 5/25 body fluid cx ngtd, acid fast smear negative, fungal cx negative, acid fast cx pending  - 5/27 cx with normal resp flora - 5/29 acid fast negative, acid fast cx pending, anaerobic cx negative, fungal cx negative, aerobic cx ngtd - Blood cx 6/2 NGTD  NSTEMI  CAD s/p CABG, DES Lcx, DES LAD - episode of CP with ST depression and troponin to 1.07 while ambulating.   - appreciate cardiology recs -> CT PE  protocol without PE - Now s/p catheterization per cardiology 6/4 -> s/p PCI to proximal LCx with DES.  Recommending ASA/brilinta x 3 months and then brilinta x 9 months.  (see op note)  Copd w/o exacerbation - Cont Breo 1puff qday and albuterol neb bid  H/o Rheumatoid arthritis - HOLD methotrexate - Cont Prednisone 10mg  po qday and Folic acid 1mg  po qday  Hypothyroidism - Cont Levothyroxine 264micrograms po qday  Anxiety - Xanax 0.25mg  po bid prn  Parkinson - Cont Sinemet IR 25/100mg  po tid  RLS - Cont Ropinirole 0.5mg  po qhs and Zanaflex 4mg  po qhs  Gerd - Cont Protonix  Pancreatic lesion - MRI as outpatient  Constipation - PRN miralax  Back pain - history of disc problems per pt - no complaints of back pain today, continue to monitor, consider further imaging if recurrent or persistent   Leukocytosis  Urinary Tract Infection - leukocytosis improving, afebrile - UA with + nitrite and leukocytes.  Also with WBC's.  Foley on May 29, removed 31st.   - Pt symptomatic per nursing. - Follow urine cx - 100,000 gram negative rods, sensitivities pending - Follow blood cx - CXR as noted above - Will start ceftriaxone for now and continue to monitor  Insomnia: will try trazodone  DVT prophylaxis: heparin Code Status: full  Family Communication: none at bedside Disposition Plan: pending   Consultants:   Cards  CT surgery  Procedures:  Echo IMPRESSIONS    1. The left ventricle has  normal systolic function, with an ejection fraction of 55-60%. The cavity size was normal. Left ventricular diastolic Doppler parameters are consistent with impaired relaxation.  2. The right ventricle has normal systolic function. The cavity was normal. There is no increase in right ventricular wall thickness.  3. The aortic valve is abnormal. Moderate thickening of the aortic valve. Moderate calcification of the aortic valve. Aortic valve regurgitation is mild by color  flow Doppler. Mild stenosis of the aortic valve. Moderate aortic annular calcification  noted.  4. There is mild dilatation of the aortic root.  5. The interatrial septum was not assessed.  LE [place] Summary: Right: There is no evidence of deep vein thrombosis in the lower extremity. However, portions of this examination were limited- see technologist comments above. No cystic structure found in the popliteal fossa. Left: There is no evidence of deep vein thrombosis in the lower extremity. No cystic structure found in the popliteal fossa.  Thoracentesis 5/25  s/p R VATS for drainage of loculated R pleural effusion, R pleural biopsies, talc pleurodesis of R pleural space, placement of R pleurx catheter on 5/29   Antimicrobials:  Anti-infectives (From admission, onward)   Start     Dose/Rate Route Frequency Ordered Stop   05/25/19 1800  cefTRIAXone (ROCEPHIN) 1 g in sodium chloride 0.9 % 100 mL IVPB     1 g 200 mL/hr over 30 Minutes Intravenous Every 24 hours 05/25/19 1650     05/20/19 1600  ceFAZolin (ANCEF) IVPB 2g/100 mL premix     2 g 200 mL/hr over 30 Minutes Intravenous Every 8 hours 05/20/19 1227 05/21/19 0047   05/20/19 0600  ceFAZolin (ANCEF) IVPB 2g/100 mL premix     2 g 200 mL/hr over 30 Minutes Intravenous To Short Stay 05/19/19 1234 05/20/19 0823   05/15/19 0245  vancomycin (VANCOCIN) IVPB 1000 mg/200 mL premix     1,000 mg 200 mL/hr over 60 Minutes Intravenous  Once 05/15/19 0232 05/15/19 0746   05/15/19 0015  piperacillin-tazobactam (ZOSYN) IVPB 3.375 g     3.375 g 12.5 mL/hr over 240 Minutes Intravenous  Once 05/15/19 0008 05/15/19 0746   05/15/19 0015  vancomycin (VANCOCIN) IVPB 1000 mg/200 mL premix     1,000 mg 200 mL/hr over 60 Minutes Intravenous Every 1 hr x 2 05/15/19 0008 05/15/19 0221      Subjective: No complaints.  States he called family this am. Had hard time sleeping last night. Declines me calling anyone.  Objective: Vitals:   05/26/19 1533  05/26/19 1538 05/26/19 1613 05/26/19 1700  BP: (!) 146/78 139/78 136/74 139/73  Pulse: 68 61  63  Resp: 16 (!) 40  19  Temp:      TempSrc:      SpO2: 100% 99%  97%  Weight:      Height:        Intake/Output Summary (Last 24 hours) at 05/26/2019 1733 Last data filed at 05/26/2019 1700 Gross per 24 hour  Intake 1652.76 ml  Output 2650 ml  Net -997.24 ml   Filed Weights   05/22/19 0500 05/23/19 0600 05/24/19 0630  Weight: 98.9 kg 98 kg 100.1 kg    Examination:  General: No acute distress. Cardiovascular: Heart sounds show Jeffrey [name] regular rate, and rhythm.  Lungs: Clear to auscultation bilaterally Abdomen: Soft, nontender, nondistended Neurological: Alert and oriented 3, sleepy. Moves all extremities 4. Cranial nerves II through XII grossly intact. Skin: Warm and dry. No rashes or lesions. Extremities: No clubbing or cyanosis. No  edema. Psychiatric: Mood and affect are normal. Insight and judgment are appropriate.    Data Reviewed: I have personally reviewed following labs and imaging studies  CBC: Recent Labs  Lab 05/20/19 0421  05/22/19 0230 05/23/19 0338 05/23/19 2208 05/24/19 0533 05/25/19 0052 05/26/19 0635  WBC 11.0*   < > 14.3* 10.9*  --  25.8* 24.8* 16.2*  NEUTROABS 6.4  --  10.2* 7.0  --  20.0* 19.0*  --   HGB 12.8*   < > 12.9* 12.4* 11.9* 11.9* 11.9* 12.2*  HCT 38.3*   < > 39.9 37.7* 35.0* 35.8* 36.0* 36.4*  MCV 94.3   < > 95.0 93.5  --  94.7 95.2 94.3  PLT 254   < > 227 243  --  234 250 258   < > = values in this interval not displayed.   Basic Metabolic Panel: Recent Labs  Lab 05/20/19 0421  05/22/19 0230 05/23/19 2831 05/23/19 2208 05/23/19 2240 05/24/19 0533 05/25/19 0052 05/26/19 0635  NA 139   < > 136 138 135  --  138 136 137  K 3.9   < > 4.3 3.7 3.3* 3.5 4.5 3.9 3.6  CL 105   < > 103 105  --   --  102 100 100  CO2 25   < > 26 25  --   --  26 26 26   GLUCOSE 102*   < > 107* 107*  --   --  111* 116* 118*  BUN 15   < > 14 11  --   --  16 19 13    CREATININE 1.22   < > 1.11 1.27*  --   --  1.63* 1.26* 1.25*  CALCIUM 9.1   < > 8.8* 8.9  --   --  8.8* 8.9 9.1  MG 1.9   < > 1.7 1.8  --   --  1.7 1.9 1.9  PHOS 3.5  --   --  2.3*  --   --   --  3.4  --    < > = values in this interval not displayed.   GFR: Estimated Creatinine Clearance: 56.7 mL/min (Jeffrey [name]) (by C-G formula based on SCr of 1.25 mg/dL (H)). Liver Function Tests: Recent Labs  Lab 05/20/19 0421 05/22/19 0230 05/23/19 0338 05/25/19 0052 05/26/19 0635  AST  --  16  --   --  31  ALT  --  6  --   --  14  ALKPHOS  --  38  --   --  83  BILITOT  --  1.0  --   --  0.8  PROT  --  5.6*  --   --  6.0*  ALBUMIN 3.0* 2.6* 2.7* 2.8* 2.6*   No results for input(s): LIPASE, AMYLASE in the last 168 hours. No results for input(s): AMMONIA in the last 168 hours. Coagulation Profile: No results for input(s): INR, PROTIME in the last 168 hours. Cardiac Enzymes: Recent Labs  Lab 05/23/19 2226 05/24/19 0533 05/24/19 1321 05/24/19 1839 05/25/19 0052  TROPONINI 0.16* 1.07* 0.95* 0.68* 0.60*   BNP (last 3 results) No results for input(s): PROBNP in the last 8760 hours. HbA1C: No results for input(s): HGBA1C in the last 72 hours. CBG: No results for input(s): GLUCAP in the last 168 hours. Lipid Profile: No results for input(s): CHOL, HDL, LDLCALC, TRIG, CHOLHDL, LDLDIRECT in the last 72 hours. Thyroid Function Tests: No results for input(s): TSH, T4TOTAL, FREET4, T3FREE, THYROIDAB in the last 72  hours. Anemia Panel: No results for input(s): VITAMINB12, FOLATE, FERRITIN, TIBC, IRON, RETICCTPCT in the last 72 hours. Sepsis Labs: No results for input(s): PROCALCITON, LATICACIDVEN in the last 168 hours.  Recent Results (from the past 240 hour(s))  Surgical pcr screen     Status: None   Collection Time: 05/18/19  5:38 PM  Result Value Ref Range Status   MRSA, PCR NEGATIVE NEGATIVE Final   Staphylococcus aureus NEGATIVE NEGATIVE Final    Comment: (NOTE) The Xpert SA Assay (FDA  approved for NASAL specimens in patients 49 years of age and older), is one component of Jeffrey [name] comprehensive surveillance program. It is not intended to diagnose infection nor to guide or monitor treatment. Performed at [facility], [place] [address] [place], [place] [address]   Expectorated sputum assessment w rflx to resp cult     Status: None   Collection Time: 05/18/19  7:25 PM  Result Value Ref Range Status   Specimen Description EXPECTORATED SPUTUM  Final   Special Requests Normal  Final   Sputum evaluation   Final    THIS SPECIMEN IS ACCEPTABLE FOR SPUTUM CULTURE Performed at [facility], [place] [address] [place], [place] [address]    Report Status 05/18/2019 FINAL  Final  Culture, respiratory     Status: None   Collection Time: 05/18/19  7:25 PM  Result Value Ref Range Status   Specimen Description EXPECTORATED SPUTUM  Final   Special Requests Normal Reflexed from [id number]  Final   Gram Stain   Final    RARE WBC PRESENT,BOTH PMN AND MONONUCLEAR FEW GRAM POSITIVE COCCI RARE GRAM NEGATIVE RODS    Culture   Final    FEW Consistent with normal respiratory flora. Performed at [facility], [place] [address], [place], [place] [address]    Report Status 05/21/2019 FINAL  Final  Anaerobic culture     Status: None   Collection Time: 05/20/19  8:35 AM  Result Value Ref Range Status   Specimen Description PLEURAL  Final   Special Requests RIGHT  Final   Culture   Final    NO ANAEROBES ISOLATED Performed at [facility], [place] [address] [place], [place] [address]    Report Status 05/25/2019 FINAL  Final  Fungus Culture With Stain     Status: None (Preliminary result)   Collection Time: 05/20/19  8:35 AM  Result Value Ref Range Status   Fungus Stain Final report  Final    Comment: (NOTE) Performed At: [facility] [place], [place] [id number] Rush Farmer MD [id number]    Fungus (Mycology) Culture PENDING  Incomplete   Fungal  Source PLEURAL  Final    Comment: Performed at [facility], [place] [address], [place], [place] [address]  Acid Fast Smear (AFB)     Status: None   Collection Time: 05/20/19  8:35 AM  Result Value Ref Range Status   AFB Specimen Processing Concentration  Final   Acid Fast Smear Negative  Final    Comment: (NOTE) Performed At: [facility] [place], [place] [id number] Rush Farmer MD [id number]    Source (AFB) PLEURAL  Final    Comment: Performed at [facility], [place] [address] [place],  [address]  Fungus Culture Result     Status: None   Collection Time: 05/20/19  8:35 AM  Result Value Ref Range Status   Result 1 Comment  Final  Comment: (NOTE) KOH/Calcofluor preparation:  no fungus observed. Performed At: [facility] [place], [place] [id number] Rush Farmer MD [id number]   Aerobic Culture (superficial specimen)     Status: None   Collection Time: 05/20/19  8:39 AM  Result Value Ref Range Status   Specimen Description PLEURAL  Final   Special Requests RIGHT  Final   Gram Stain   Final    FEW WBC PRESENT, PREDOMINANTLY PMN NO ORGANISMS SEEN    Culture   Final    NO GROWTH 2 DAYS Performed at [facility], [address] [place], [place] [address]    Report Status 05/22/2019 FINAL  Final  Culture, blood (routine x 2)     Status: None (Preliminary result)   Collection Time: 05/24/19 11:26 AM  Result Value Ref Range Status   Specimen Description BLOOD LEFT HAND  Final   Special Requests   Final    BOTTLES DRAWN AEROBIC ONLY Blood Culture adequate volume   Culture   Final    NO GROWTH 2 DAYS Performed at [facility], [place] [address] [place], [place] [address]    Report Status PENDING  Incomplete  Culture, blood (routine x 2)     Status: None (Preliminary result)   Collection Time: 05/24/19 11:27 AM  Result Value Ref Range Status   Specimen Description BLOOD LEFT HAND  Final    Special Requests   Final    BOTTLES DRAWN AEROBIC AND ANAEROBIC Blood Culture results may not be optimal due to an excessive volume of blood received in culture bottles   Culture   Final    NO GROWTH 2 DAYS Performed at [facility], [place] [address] [place], [place] [address]    Report Status PENDING  Incomplete  Culture, Urine     Status: Abnormal (Preliminary result)   Collection Time: 05/25/19  5:13 PM  Result Value Ref Range Status   Specimen Description URINE, RANDOM  Final   Special Requests   Final    NONE Performed at [facility], [place] [address] [place], [place] [address]    Culture >=100,000 COLONIES/mL GRAM NEGATIVE RODS (Jeffrey [name])  Final   Report Status PENDING  Incomplete         Radiology Studies: Dg Chest Port 1 View  Result Date: 05/26/2019 CLINICAL DATA:  Follow-up VATS EXAM: PORTABLE CHEST 1 VIEW COMPARISON:  05/25/2019 FINDINGS: Cardiac shadow is stable. Postsurgical changes are again noted. Small right pleural effusion and right basilar atelectasis are noted. PleurX catheter is noted on the right and stable. IMPRESSION: Stable right pleural effusion and basilar atelectasis. PleurX catheter is stable in appearance. Electronically Signed   By: Jeffrey [name] M.D.   On: 05/26/2019 07:54   Dg Chest Port 1 View  Result Date: 05/25/2019 CLINICAL DATA:  Recent VATS EXAM: PORTABLE CHEST 1 VIEW COMPARISON:  05/24/2019 FINDINGS: Cardiac shadow is stable. Postsurgical changes are again seen. Small right pleural effusion is again noted and stable. Mild right basilar atelectasis is noted. No bony abnormality is noted. IMPRESSION: Small right pleural effusion and right basilar atelectasis. Electronically Signed   By: Jeffrey [name] M.D.   On: 05/25/2019 08:49        Scheduled Meds: . aspirin EC  81 mg Oral Daily  . bisacodyl  10 mg Oral Daily  . carbidopa-levodopa  1 tablet Oral TID  . Chlorhexidine Gluconate Cloth  6 each Topical Daily  . fluticasone  furoate-vilanterol  1 puff Inhalation Daily  . folic acid  1 mg Oral Daily  . levothyroxine  200 mcg Oral QHS  . magnesium oxide  400 mg Oral BID  . pantoprazole  40 mg Oral Daily  . predniSONE  10 mg Oral Q breakfast  . rOPINIRole  0.5 mg Oral QHS  . senna-docusate  1 tablet Oral QHS  . tiZANidine  4 mg Oral QHS   Continuous Infusions: . sodium chloride 10 mL/hr at 05/26/19 0200  . bivalirudin (ANGIOMAX) infusion 5 mg/mL (Cath Lab,ACS,PCI indication) 1.75 mg/kg/hr (05/26/19 1713)  . cefTRIAXone (ROCEPHIN)  IV Stopped (05/25/19 1752)  . potassium chloride       LOS: 11 days    Time spent: over 30 min    Jeffrey Helper, MD [facility] Pager AMION  If 7PM-7AM, please contact night-coverage [email or web address] Password [facility] 05/26/2019, 5:33 PM

## 2019-05-26 NOTE — H&P (View-Only) (Signed)
Progress Note  Patient Name: Jeffrey [name] Date of Encounter: 05/26/2019  Primary Cardiologist: Dr. Sabra Heck, Kermit Balo at [facility].   Subjective   85 yo with hx of CAD, s/p remote NSTEMI , hx of CABG in 2011  Hx of HTN, mild AS  Found to have mesothelioma, Had a VATS procedure several days ago  Was walking after procedure , complained of low back pain .  ECG showed marked ST depression  ECG today shows resolution of the ST depression.   Troponin peaked at 1.07.  Was 0.95 today at 1321   Continues to do well. No angina  K is 3.6  Will give him kdur 30 meq this am       Inpatient Medications    Scheduled Meds: . aspirin EC  81 mg Oral Daily  . bisacodyl  10 mg Oral Daily  . carbidopa-levodopa  1 tablet Oral TID  . Chlorhexidine Gluconate Cloth  6 each Topical Daily  . fluticasone furoate-vilanterol  1 puff Inhalation Daily  . folic acid  1 mg Oral Daily  . levothyroxine  200 mcg Oral QHS  . magnesium oxide  400 mg Oral BID  . pantoprazole  40 mg Oral Daily  . predniSONE  10 mg Oral Q breakfast  . rOPINIRole  0.5 mg Oral QHS  . senna-docusate  1 tablet Oral QHS  . sodium chloride flush  3 mL Intravenous Q12H  . tiZANidine  4 mg Oral QHS   Continuous Infusions: . sodium chloride 10 mL/hr at 05/26/19 0200  . sodium chloride    . sodium chloride 1 mL/kg/hr (05/26/19 0610)  . cefTRIAXone (ROCEPHIN)  IV Stopped (05/25/19 1752)  . heparin 1,400 Units/hr (05/26/19 0256)  . potassium chloride     PRN Meds: sodium chloride, ALPRAZolam, ipratropium-albuterol, oxyCODONE, polyethylene glycol, potassium chloride, sodium chloride flush, traMADol   Vital Signs    Vitals:   05/26/19 0640 05/26/19 0700 05/26/19 0746 05/26/19 0817  BP: 121/70 113/62    Pulse:  64    Resp: 19 (!) 22    Temp:    98.1 F (36.7 C)  TempSrc:    Oral  SpO2: 94% 92% 99%   Weight:      Height:        Intake/Output Summary (Last 24 hours) at 05/26/2019 0835 Last data filed at  05/26/2019 0700 Gross per 24 hour  Intake 1177.15 ml  Output 2525 ml  Net -1347.85 ml   Last 3 Weights 05/24/2019 05/23/2019 05/22/2019  Weight (lbs) 220 lb 10.9 oz 216 lb 0.8 oz 218 lb 0.6 oz  Weight (kg) 100.1 kg 98 kg 98.9 kg      Telemetry    NSR 79 - Personally Reviewed  ECG     - Personally Reviewed  Physical Exam    Physical Exam: Blood pressure 113/62, pulse 64, temperature 98.1 F (36.7 C), temperature source Oral, resp. rate (!) 22, height 6\' 1"  (1.854 m), weight 100.1 kg, SpO2 99 %.  GEN:  Elderly male, NAD  HEENT: Normal NECK: No JVD; No carotid bruits LYMPHATICS: No lymphadenopathy CARDIAC: RRR   RESPIRATORY:  Reduced breath sounds right side,  pleurex catheter right side  ABDOMEN: Soft, non-tender, non-distended MUSCULOSKELETAL:  1 + edema  SKIN: Warm and dry NEUROLOGIC:  Alert and oriented x 3   Labs    Chemistry Recent Labs  Lab 05/19/19 1112  05/22/19 0230 05/23/19 3419  05/24/19 0533 05/25/19 0052 05/26/19 0635  NA 140   < >  136 138   < > 138 136 137  K 3.7   < > 4.3 3.7   < > 4.5 3.9 3.6  CL 106   < > 103 105  --  102 100 100  CO2 28   < > 26 25  --  26 26 26   GLUCOSE 127*   < > 107* 107*  --  111* 116* 118*  BUN 16   < > 14 11  --  16 19 13   CREATININE 1.47*   < > 1.11 1.27*  --  1.63* 1.26* 1.25*  CALCIUM 9.0   < > 8.8* 8.9  --  8.8* 8.9 9.1  PROT 5.9*  --  5.6*  --   --   --   --  6.0*  ALBUMIN 3.0*   < > 2.6* 2.7*  --   --  2.8* 2.6*  AST 17  --  16  --   --   --   --  31  ALT 9  --  6  --   --   --   --  14  ALKPHOS 38  --  38  --   --   --   --  83  BILITOT 0.4  --  1.0  --   --   --   --  0.8  GFRNONAA 44*   < > >60 52*  --  39* 53* 53*  GFRAA 51*   < > >60 >60  --  45* >60 >60  ANIONGAP 6   < > 7 8  --  10 10 11    < > = values in this interval not displayed.     Hematology Recent Labs  Lab 05/24/19 0533 05/25/19 0052 05/26/19 0635  WBC 25.8* 24.8* 16.2*  RBC 3.78* 3.78* 3.86*  HGB 11.9* 11.9* 12.2*  HCT 35.8* 36.0*  36.4*  MCV 94.7 95.2 94.3  MCH 31.5 31.5 31.6  MCHC 33.2 33.1 33.5  RDW 15.3 15.3 15.1  PLT 234 250 258    Cardiac Enzymes Recent Labs  Lab 05/24/19 0533 05/24/19 1321 05/24/19 1839 05/25/19 0052  TROPONINI 1.07* 0.95* 0.68* 0.60*   No results for input(s): TROPIPOC in the last 168 hours.   BNPNo results for input(s): BNP, PROBNP in the last 168 hours.   DDimer No results for input(s): DDIMER in the last 168 hours.   Radiology    Dg Chest Port 1 View  Result Date: 05/26/2019 CLINICAL DATA:  Follow-up VATS EXAM: PORTABLE CHEST 1 VIEW COMPARISON:  05/25/2019 FINDINGS: Cardiac shadow is stable. Postsurgical changes are again noted. Small right pleural effusion and right basilar atelectasis are noted. PleurX catheter is noted on the right and stable. IMPRESSION: Stable right pleural effusion and basilar atelectasis. PleurX catheter is stable in appearance. Electronically Signed   By: Inez Catalina M.D.   On: 05/26/2019 07:54   Dg Chest Port 1 View  Result Date: 05/25/2019 CLINICAL DATA:  Recent VATS EXAM: PORTABLE CHEST 1 VIEW COMPARISON:  05/24/2019 FINDINGS: Cardiac shadow is stable. Postsurgical changes are again seen. Small right pleural effusion is again noted and stable. Mild right basilar atelectasis is noted. No bony abnormality is noted. IMPRESSION: Small right pleural effusion and right basilar atelectasis. Electronically Signed   By: Inez Catalina M.D.   On: 05/25/2019 08:49    Cardiac Studies     Patient Profile     85 y.o. male with mesothelioma, CAD   Assessment & Plan  1.  CAD :   Has a hx of CABG, stenting.   He had symptoms of angina with ambulation associated with ST depression and troponin bump.   No angina over night. For cath today   I have talked with [name].  He sees no issues with the pleurex catheter and the need for long term DAPT ( if he is stented. )   2.  Mesothelioma:   Plans per [name].      For questions or updates, please  contact [place] Please consult [email or web address] for contact info under        Signed, Mertie Moores, MD  05/26/2019, 8:35 AM

## 2019-05-26 NOTE — Progress Notes (Signed)
No acute events overnight. VSS. NPO since midnight for cardiac cath today.

## 2019-05-26 NOTE — Interval H&P Note (Signed)
History and Physical Interval Note:  05/26/2019 1:24 PM  Jeffrey [name]  has presented today for cardiac catheterization, with the diagnosis of NSTEMI.  The various methods of treatment have been discussed with the patient and family. After consideration of risks, benefits and other options for treatment, the patient has consented to  Procedure(s): LEFT HEART CATH AND CORS/GRAFTS ANGIOGRAPHY (N/A) as a surgical intervention.  The patient's history has been reviewed, patient examined, no change in status, stable for surgery.  I have reviewed the patient's chart and labs.  Questions were answered to the patient's satisfaction.     [name]

## 2019-05-27 ENCOUNTER — Encounter (HOSPITAL_COMMUNITY): Payer: Self-pay | Admitting: Internal Medicine

## 2019-05-27 DIAGNOSIS — I200 Unstable angina: Secondary | ICD-10-CM

## 2019-05-27 LAB — COMPREHENSIVE METABOLIC PANEL
ALT: 11 U/L (ref 0–44)
AST: 25 U/L (ref 15–41)
Albumin: 2.6 g/dL — ABNORMAL LOW (ref 3.5–5.0)
Alkaline Phosphatase: 96 U/L (ref 38–126)
Anion gap: 10 (ref 5–15)
BUN: 11 mg/dL (ref 8–23)
CO2: 26 mmol/L (ref 22–32)
Calcium: 9.1 mg/dL (ref 8.9–10.3)
Chloride: 103 mmol/L (ref 98–111)
Creatinine, Ser: 1.24 mg/dL (ref 0.61–1.24)
GFR calc Af Amer: >60 mL/min (ref >60)
GFR calc non Af Amer: 54 mL/min — ABNORMAL LOW (ref >60)
Glucose, Bld: 101 mg/dL — ABNORMAL HIGH (ref 70–99)
Potassium: 4 mmol/L (ref 3.5–5.1)
Sodium: 139 mmol/L (ref 135–145)
Total Bilirubin: 0.8 mg/dL (ref 0.3–1.2)
Total Protein: 5.8 g/dL — ABNORMAL LOW (ref 6.5–8.1)

## 2019-05-27 LAB — URINE CULTURE: Culture: >=100000 — AB

## 2019-05-27 LAB — CBC
HCT: 36 % — ABNORMAL LOW (ref 39.0–52.0)
Hemoglobin: 11.9 g/dL — ABNORMAL LOW (ref 13.0–17.0)
MCH: 31 pg (ref 26.0–34.0)
MCHC: 33.1 g/dL (ref 30.0–36.0)
MCV: 93.8 fL (ref 80.0–100.0)
Platelets: 259 10*3/uL (ref 150–400)
RBC: 3.84 MIL/uL — ABNORMAL LOW (ref 4.22–5.81)
RDW: 15.3 % (ref 11.5–15.5)
WBC: 14 10*3/uL — ABNORMAL HIGH (ref 4.0–10.5)
nRBC: 0 % (ref 0.0–0.2)

## 2019-05-27 LAB — POCT ACTIVATED CLOTTING TIME
Activated Clotting Time: 175 seconds
Activated Clotting Time: 180 seconds
Activated Clotting Time: 186 seconds
Activated Clotting Time: 175 seconds
Activated Clotting Time: 180 seconds
Activated Clotting Time: 164 seconds

## 2019-05-27 LAB — NOVEL CORONAVIRUS, NAA (HOSP ORDER, SEND-OUT TO REF LAB; TAT 18-24 HRS): SARS-CoV-2, NAA: NOT DETECTED

## 2019-05-27 LAB — MAGNESIUM: Magnesium: 1.8 mg/dL (ref 1.7–2.4)

## 2019-05-27 MED ORDER — ATROPINE SULFATE 1 MG/10ML IJ SOSY
PREFILLED_SYRINGE | INTRAMUSCULAR | Status: AC
  Filled 2019-05-27: qty 10

## 2019-05-27 MED ORDER — NITROGLYCERIN 0.4 MG SL SUBL: 0.4000 mg | SUBLINGUAL_TABLET | SUBLINGUAL | Status: DC | PRN

## 2019-05-27 MED ORDER — TIZANIDINE HCL 4 MG PO TABS: 4.0000 mg | ORAL_TABLET | Freq: Every day | ORAL | Status: DC

## 2019-05-27 MED ORDER — CIPROFLOXACIN HCL 500 MG PO TABS
250.0000 mg | ORAL_TABLET | Freq: Two times a day (BID) | ORAL | Status: DC
  Administered 2019-05-27 – 2019-05-28 (×2): 250 mg via ORAL
  Filled 2019-05-27 (×4): qty 1

## 2019-05-27 NOTE — Progress Notes (Addendum)
      [address]       [place] [address]             [phone]      1 Day Post-Op Procedure(s) (LRB): LEFT HEART CATH AND CORS/GRAFTS ANGIOGRAPHY (N/A) CORONARY STENT INTERVENTION (N/A)   Subjective:  Patient says he is short of breath this morning.  Objective: Vital signs in last 24 hours: Temp:  [97.8 F (36.6 C)-98.4 F (36.9 C)] 98.4 F (36.9 C) (06/05 0808) Pulse Rate:  [56-106] 63 (06/05 0700) Cardiac Rhythm: Normal sinus rhythm (06/05 0400) Resp:  [10-40] 25 (06/05 0700) BP: (117-172)/(55-89) 119/68 (06/05 0700) SpO2:  [87 %-100 %] 98 % (06/05 0815) Weight:  [99.1 kg] 99.1 kg (06/05 0500)  Intake/Output from previous day: 06/04 0701 - 06/05 0700 In: 2042.2 [I.V.:1942.2; IV Piggyback:100] Out: 2300 [Urine:2150; Chest Tube:150]  General appearance: alert, cooperative and no distress Heart: regular rate and rhythm Lungs: diminished breath sounds on right Abdomen: soft, non-tender; bowel sounds normal; no masses,  no organomegaly Wound: clean and dry  Lab Results: Recent Labs    05/26/19 0635 05/27/19 0349  WBC 16.2* 14.0*  HGB 12.2* 11.9*  HCT 36.4* 36.0*  PLT 258 259   BMET:  Recent Labs    05/26/19 0635 05/27/19 0349  NA 137 139  K 3.6 4.0  CL 100 103  CO2 26 26  GLUCOSE 118* 101*  BUN 13 11  CREATININE 1.25* 1.24  CALCIUM 9.1 9.1    PT/INR: No results for input(s): LABPROT, INR in the last 72 hours. ABG    Component Value Date/Time   PHART 7.467 (H) 05/23/2019 2208   HCO3 26.0 05/23/2019 2208   TCO2 27 05/23/2019 2208   ACIDBASEDEF 1.0 05/21/2019 0420   O2SAT 97.0 05/23/2019 2208   CBG (last 3)  No results for input(s): GLUCAP in the last 72 hours.  Assessment/Plan: S/P Procedure(s) (LRB): LEFT HEART CATH AND CORS/GRAFTS ANGIOGRAPHY (N/A) CORONARY STENT INTERVENTION (N/A)  1. CV- S/P NSTEMI- cath yesterday with successful PCI... care per Cardiology 2. Right sided pleural effusion, should be draining  catheter daily.[name] Kitchen upon discharge will drain every MWF 3. Pathology-sent out for confirmation 4. Dispo- care per primary, okay to discharge home from our standpoint when medically stable   LOS: 12 days    Ellwood Handler 05/27/2019  PCI of prox circumflex yesterday Draining R pleurx daily while in hospital [facility] to drain M W F at discharge  patient examined and medical record reviewed,agree with above note. Tharon Aquas Trigt III 05/27/2019

## 2019-05-27 NOTE — Progress Notes (Signed)
      [address]       [place] [address]             [phone]      Up in chair Still with congestion and cough Denies any anginal pain BP 132/69   Pulse 67   Temp 98.1 F (36.7 C) (Oral)   Resp 20   Ht 6\' 1"  (1.854 m)   Wt 99.1 kg   SpO2 97%   BMI 28.82 kg/m    Remo Lipps C. Roxan Hockey, MD [facility] [phone]

## 2019-05-27 NOTE — Progress Notes (Signed)
Progress Note  Patient Name: Jeffrey [name] Date of Encounter: 05/27/2019  Primary Cardiologist: Dr. Sabra Heck, [facility] at [facility].   Subjective   85 yo with hx of CAD, s/p remote NSTEMI , hx of CABG in 2011  Hx of HTN, mild AS  Found to have mesothelioma, Had a VATS procedure several days ago  Was walking after procedure , complained of low back pain .  ECG showed marked ST depression  ECG today shows resolution of the ST depression.   Troponin peaked at 1.07.  Was 0.95 today at 1321   S/p PCI of prox LCx  hes on ASA and brilinta  The plan is to stop ASA in 3 months and continue Brilinta for a total of 1 year   complains of lots of congestion .  No angina    Inpatient Medications    Scheduled Meds: . aspirin EC  81 mg Oral Daily  . bisacodyl  10 mg Oral Daily  . carbidopa-levodopa  1 tablet Oral TID  . Chlorhexidine Gluconate Cloth  6 each Topical Daily  . fluticasone furoate-vilanterol  1 puff Inhalation Daily  . folic acid  1 mg Oral Daily  . heparin  5,000 Units Subcutaneous Q8H  . levothyroxine  200 mcg Oral QHS  . magnesium oxide  400 mg Oral BID  . pantoprazole  40 mg Oral Daily  . predniSONE  10 mg Oral Q breakfast  . rOPINIRole  0.5 mg Oral QHS  . senna-docusate  1 tablet Oral QHS  . sodium chloride flush  3 mL Intravenous Q12H  . ticagrelor  90 mg Oral BID  . tiZANidine  4 mg Oral QHS  . traZODone  50 mg Oral QHS   Continuous Infusions: . sodium chloride 100 mL/hr at 05/26/19 1317  . sodium chloride    . cefTRIAXone (ROCEPHIN)  IV Stopped (05/26/19 1902)  . potassium chloride     PRN Meds: sodium chloride, ALPRAZolam, ipratropium-albuterol, oxyCODONE, polyethylene glycol, potassium chloride, sodium chloride flush, traMADol   Vital Signs    Vitals:   05/27/19 0600 05/27/19 0700 05/27/19 0808 05/27/19 0815  BP: 135/66 119/68    Pulse: (!) 59 63    Resp: (!) 24 (!) 25    Temp:   98.4 F (36.9 C)   TempSrc:   Oral   SpO2: 99%  100%  98%  Weight:      Height:        Intake/Output Summary (Last 24 hours) at 05/27/2019 0834 Last data filed at 05/27/2019 0600 Gross per 24 hour  Intake 2042.22 ml  Output 2300 ml  Net -257.78 ml   Last 3 Weights 05/27/2019 05/24/2019 05/23/2019  Weight (lbs) 218 lb 7.6 oz 220 lb 10.9 oz 216 lb 0.8 oz  Weight (kg) 99.1 kg 100.1 kg 98 kg      Telemetry    NSR 79 - Personally Reviewed  ECG     - Personally Reviewed  Physical Exam    Physical Exam: Blood pressure 119/68, pulse 63, temperature 98.4 F (36.9 C), temperature source Oral, resp. rate (!) 25, height 6\' 1"  (1.854 m), weight 99.1 kg, SpO2 98 %.  GEN: elderly , chronically ill man,  Very weak  HEENT: Normal NECK: No JVD; No carotid bruits LYMPHATICS: No lymphadenopathy CARDIAC: RRR  2/6 systolic murmur  RESPIRATORY:  Consolidation on right side ,  pleuedex catheter in place  ABDOMEN: Soft, non-tender, non-distended MUSCULOSKELETAL:  No edema; No deformity  SKIN: Warm  and dry NEUROLOGIC:  Alert and oriented x 3    Labs    Chemistry Recent Labs  Lab 05/22/19 0230  05/25/19 0052 05/26/19 0635 05/27/19 0349  NA 136   < > 136 137 139  K 4.3   < > 3.9 3.6 4.0  CL 103   < > 100 100 103  CO2 26   < > 26 26 26   GLUCOSE 107*   < > 116* 118* 101*  BUN 14   < > 19 13 11   CREATININE 1.11   < > 1.26* 1.25* 1.24  CALCIUM 8.8*   < > 8.9 9.1 9.1  PROT 5.6*  --   --  6.0* 5.8*  ALBUMIN 2.6*   < > 2.8* 2.6* 2.6*  AST 16  --   --  31 25  ALT 6  --   --  14 11  ALKPHOS 38  --   --  83 96  BILITOT 1.0  --   --  0.8 0.8  GFRNONAA >60   < > 53* 53* 54*  GFRAA >60   < > >60 >60 >60  ANIONGAP 7   < > 10 11 10    < > = values in this interval not displayed.     Hematology Recent Labs  Lab 05/25/19 0052 05/26/19 0635 05/27/19 0349  WBC 24.8* 16.2* 14.0*  RBC 3.78* 3.86* 3.84*  HGB 11.9* 12.2* 11.9*  HCT 36.0* 36.4* 36.0*  MCV 95.2 94.3 93.8  MCH 31.5 31.6 31.0  MCHC 33.1 33.5 33.1  RDW 15.3 15.1 15.3  PLT 250  258 259    Cardiac Enzymes Recent Labs  Lab 05/24/19 0533 05/24/19 1321 05/24/19 1839 05/25/19 0052  TROPONINI 1.07* 0.95* 0.68* 0.60*   No results for input(s): TROPIPOC in the last 168 hours.   BNPNo results for input(s): BNP, PROBNP in the last 168 hours.   DDimer No results for input(s): DDIMER in the last 168 hours.   Radiology    Dg Chest Port 1 View  Result Date: 05/26/2019 CLINICAL DATA:  Follow-up VATS EXAM: PORTABLE CHEST 1 VIEW COMPARISON:  05/25/2019 FINDINGS: Cardiac shadow is stable. Postsurgical changes are again noted. Small right pleural effusion and right basilar atelectasis are noted. PleurX catheter is noted on the right and stable. IMPRESSION: Stable right pleural effusion and basilar atelectasis. PleurX catheter is stable in appearance. Electronically Signed   By: Inez Catalina M.D.   On: 05/26/2019 07:54    Cardiac Studies     Patient Profile     85 y.o. male with mesothelioma, CAD   Assessment & Plan    1.  CAD :   Has a hx of CABG, stenting.   S/p stent placement to prox LCx. Doing well from a cardiac standpoint.  He may be discharged from my standpoint  Continue ASA 81 mg a day  and Brilinta 90 mg PO BID for 3 months and then DC ASA.  Continue Brilinta for 9 more months Follow up with Dr. Sabra Heck in [place], [place] or Dr. Radford Pax if he wants to be seen in [place].  DC with NTG 0.4 mg SL PRN chest pain    2.  Mesothelioma:   Plans per [name].       For questions or updates, please contact [place] Please consult [email or web address] for contact info under    [place] will sign off.   Medication Recommendations:  See above  Other recommendations (labs, testing, etc):  Follow up as an outpatient:  With Dr. Sabra Heck in [place], [place]       Signed, Mertie Moores, MD  05/27/2019, 8:34 AM

## 2019-05-27 NOTE — Discharge Instructions (Signed)
PLEASE DRAIN PLEURX CATHETER EVERY MWF.... RECORD OUTPUT, WHEN IS 150 or less contact office at [phone]  1. You may shower, please keep pleurx catheter site clean and dry 2. No driving if taking narcotic pain medication and until you are cleared by Dr. Lucianne Lei Trigt's office 3. Activity- ambulate at least 3 times daily, avoid strenuous activity and heavy lifting until cleared by Dr. Lucianne Lei Trigt's office

## 2019-05-27 NOTE — Progress Notes (Signed)
PROGRESS NOTE    Jeffrey [name]  YKD:[id number] DOB: 1934/05/03 DOA: 05/14/2019 PCP: [name], MD   Brief Narrative:  Jeffrey [name] an 85 y.o.malew CAD s/p CABG, ischemic CM, mild Aortic stenosis, Asthma (mild), OSA, Hypothyroidism, Parkinsons, Anxiety, remote hx of MRSE septic arthritis left knee s/p L knee ID/ synovectomy, L THA, Anemia/ ITP apparently presents with c/o cough with white->slight yellow sputum since January of this year. Pt notes dyspnea for the past 4 weeks. Worse over the past 3-4 day   Assessment & Plan:   Principal Problem:   Pleural effusion on right Active Problems:   CAD (coronary artery disease) of artery bypass graft   Hypothyroidism   PD (Parkinson's disease) (HCC)   Pancreatic lesion   Shortness of breath   Coronary artery disease due to lipid rich plaque   Elevated troponin   Coronary artery disease involving native coronary artery of native heart with unstable angina pectoris (HCC)   Right pleural effusion - s/p thoracentesis on 5/25 by pulm -> lymphocytic exudate with low glucose (thought unlikely rheumatoid effusion despite low glucose).  Cytology was negative.  Concern for mesothelioma given pleural plaque.   - s/p R VATS for drainage of loculated R pleural effusion, R pleural biopsies, talc pleurodesis of R pleural space, placement of R pleurx catheter on 5/29  - recommending MWF draining at discharge - Pleural biopsy pending   - Recent CXR 6/3 with small R pleural effusion and R basilar atelecatasis - Daily pleurx drainage per CT surgery  - Echo with EF 55-60% - 5/25 body fluid cx ngtd, acid fast smear negative, fungal cx negative, acid fast cx pending  - 5/27 cx with normal resp flora - 5/29 acid fast negative, acid fast cx pending, anaerobic cx negative, fungal cx negative, aerobic cx ngtd - Blood cx 6/2 NGTD  NSTEMI  CAD s/p CABG, DES Lcx, DES LAD - episode of CP with ST depression and troponin to 1.07 while ambulating.    - appreciate cardiology recs -> CT PE protocol without PE - Now s/p catheterization per cardiology 6/4 -> s/p PCI to proximal LCx with DES.  Recommending ASA/brilinta x 3 months and then brilinta x 9 months.  (see op note) - Recommending f/u with dr. Sabra Heck in [place].   Copd w/o exacerbation - Cont Breo 1puff qday and albuterol neb bid  H/o Rheumatoid arthritis - HOLD methotrexate - Cont Prednisone 10mg  po qday and Folic acid 1mg  po qday  Hypothyroidism - Cont Levothyroxine 219micrograms po qday  Anxiety - Xanax 0.25mg  po bid prn  Parkinson - Cont Sinemet IR 25/100mg  po tid  RLS - Cont Ropinirole 0.5mg  po qhs  - hold zanaflex while on cipro  Gerd - Cont Protonix  Pancreatic lesion - MRI as outpatient  Constipation - PRN miralax  Back pain - history of disc problems per pt - no complaints of back pain today, continue to monitor, consider further imaging if recurrent or persistent   Leukocytosis  Urinary Tract Infection - leukocytosis improving, afebrile - UA with + nitrite and leukocytes.  Also with WBC's.  Foley on May 29, removed 31st.   - Pt symptomatic per nursing. - Follow urine cx - 100,000 gram negative rods -> pseudomonas, on cipro (holding zanaflex while on cipro) - Follow blood cx - CXR as noted above - Will start ceftriaxone for now and continue to monitor  Insomnia: will try trazodone  DVT prophylaxis: heparin Code Status: full  Family Communication: none at bedside Disposition  Plan: pending PT eval and recommendations for d/c   Consultants:   Cards  CT surgery  Procedures:  Echo IMPRESSIONS    1. The left ventricle has normal systolic function, with an ejection fraction of 55-60%. The cavity size was normal. Left ventricular diastolic Doppler parameters are consistent with impaired relaxation.  2. The right ventricle has normal systolic function. The cavity was normal. There is no increase in right ventricular wall  thickness.  3. The aortic valve is abnormal. Moderate thickening of the aortic valve. Moderate calcification of the aortic valve. Aortic valve regurgitation is mild by color flow Doppler. Mild stenosis of the aortic valve. Moderate aortic annular calcification  noted.  4. There is mild dilatation of the aortic root.  5. The interatrial septum was not assessed.  LE [place] Summary: Right: There is no evidence of deep vein thrombosis in the lower extremity. However, portions of this examination were limited- see technologist comments above. No cystic structure found in the popliteal fossa. Left: There is no evidence of deep vein thrombosis in the lower extremity. No cystic structure found in the popliteal fossa.  Thoracentesis 5/25  s/p R VATS for drainage of loculated R pleural effusion, R pleural biopsies, talc pleurodesis of R pleural space, placement of R pleurx catheter on 5/29   Antimicrobials:  Anti-infectives (From admission, onward)   Start     Dose/Rate Route Frequency Ordered Stop   05/27/19 2000  ciprofloxacin (CIPRO) tablet 250 mg     250 mg Oral 2 times daily 05/27/19 1352 06/01/19 1959   05/25/19 1800  cefTRIAXone (ROCEPHIN) 1 g in sodium chloride 0.9 % 100 mL IVPB  Status:  Discontinued     1 g 200 mL/hr over 30 Minutes Intravenous Every 24 hours 05/25/19 1650 05/27/19 1352   05/20/19 1600  ceFAZolin (ANCEF) IVPB 2g/100 mL premix     2 g 200 mL/hr over 30 Minutes Intravenous Every 8 hours 05/20/19 1227 05/21/19 0047   05/20/19 0600  ceFAZolin (ANCEF) IVPB 2g/100 mL premix     2 g 200 mL/hr over 30 Minutes Intravenous To Short Stay 05/19/19 1234 05/20/19 0823   05/15/19 0245  vancomycin (VANCOCIN) IVPB 1000 mg/200 mL premix     1,000 mg 200 mL/hr over 60 Minutes Intravenous  Once 05/15/19 0232 05/15/19 0746   05/15/19 0015  piperacillin-tazobactam (ZOSYN) IVPB 3.375 g     3.375 g 12.5 mL/hr over 240 Minutes Intravenous  Once 05/15/19 0008 05/15/19 0746   05/15/19 0015   vancomycin (VANCOCIN) IVPB 1000 mg/200 mL premix     1,000 mg 200 mL/hr over 60 Minutes Intravenous Every 1 hr x 2 05/15/19 0008 05/15/19 0221      Subjective: Feeling well. No specific complaints today.  Objective: Vitals:   05/27/19 1558 05/27/19 1600 05/27/19 1700 05/27/19 1800  BP:  132/69 120/65 121/63  Pulse:  67 75 87  Resp:  20 (!) 27 (!) 32  Temp: 98.1 F (36.7 C)     TempSrc: Oral     SpO2:  97% 96% 98%  Weight:      Height:        Intake/Output Summary (Last 24 hours) at 05/27/2019 1919 Last data filed at 05/27/2019 1700 Gross per 24 hour  Intake 829.84 ml  Output 1600 ml  Net -770.16 ml   Filed Weights   05/23/19 0600 05/24/19 0630 05/27/19 0500  Weight: 98 kg 100.1 kg 99.1 kg    Examination:  General: No acute distress. Cardiovascular: Heart  sounds show Nigel Wessman regular rate, and rhythm.  Lungs: Clear to auscultation bilaterally.  R sided pleurx. Abdomen: Soft, nontender, nondistended Neurological: Alert and oriented 3. Moves all extremities 4. Cranial nerves II through XII grossly intact. Skin: Warm and dry. No rashes or lesions. Extremities: No clubbing or cyanosis. No edema.  Psychiatric: Mood and affect are normal. Insight and judgment are appropriate.   Data Reviewed: I have personally reviewed following labs and imaging studies  CBC: Recent Labs  Lab 05/22/19 0230 05/23/19 0338 05/23/19 2208 05/24/19 0533 05/25/19 0052 05/26/19 0635 05/27/19 0349  WBC 14.3* 10.9*  --  25.8* 24.8* 16.2* 14.0*  NEUTROABS 10.2* 7.0  --  20.0* 19.0*  --   --   HGB 12.9* 12.4* 11.9* 11.9* 11.9* 12.2* 11.9*  HCT 39.9 37.7* 35.0* 35.8* 36.0* 36.4* 36.0*  MCV 95.0 93.5  --  94.7 95.2 94.3 93.8  PLT 227 243  --  234 250 258 213   Basic Metabolic Panel: Recent Labs  Lab 05/23/19 0338 05/23/19 2208 05/23/19 2240 05/24/19 0533 05/25/19 0052 05/26/19 0635 05/27/19 0349  NA 138 135  --  138 136 137 139  K 3.7 3.3* 3.5 4.5 3.9 3.6 4.0  CL 105  --   --  102 100  100 103  CO2 25  --   --  26 26 26 26   GLUCOSE 107*  --   --  111* 116* 118* 101*  BUN 11  --   --  16 19 13 11   CREATININE 1.27*  --   --  1.63* 1.26* 1.25* 1.24  CALCIUM 8.9  --   --  8.8* 8.9 9.1 9.1  MG 1.8  --   --  1.7 1.9 1.9 1.8  PHOS 2.3*  --   --   --  3.4  --   --    GFR: Estimated Creatinine Clearance: 56.9 mL/min (by C-G formula based on SCr of 1.24 mg/dL). Liver Function Tests: Recent Labs  Lab 05/22/19 0230 05/23/19 0338 05/25/19 0052 05/26/19 0635 05/27/19 0349  AST 16  --   --  31 25  ALT 6  --   --  14 11  ALKPHOS 38  --   --  83 96  BILITOT 1.0  --   --  0.8 0.8  PROT 5.6*  --   --  6.0* 5.8*  ALBUMIN 2.6* 2.7* 2.8* 2.6* 2.6*   No results for input(s): LIPASE, AMYLASE in the last 168 hours. No results for input(s): AMMONIA in the last 168 hours. Coagulation Profile: No results for input(s): INR, PROTIME in the last 168 hours. Cardiac Enzymes: Recent Labs  Lab 05/23/19 2226 05/24/19 0533 05/24/19 1321 05/24/19 1839 05/25/19 0052  TROPONINI 0.16* 1.07* 0.95* 0.68* 0.60*   BNP (last 3 results) No results for input(s): PROBNP in the last 8760 hours. HbA1C: No results for input(s): HGBA1C in the last 72 hours. CBG: No results for input(s): GLUCAP in the last 168 hours. Lipid Profile: No results for input(s): CHOL, HDL, LDLCALC, TRIG, CHOLHDL, LDLDIRECT in the last 72 hours. Thyroid Function Tests: No results for input(s): TSH, T4TOTAL, FREET4, T3FREE, THYROIDAB in the last 72 hours. Anemia Panel: No results for input(s): VITAMINB12, FOLATE, FERRITIN, TIBC, IRON, RETICCTPCT in the last 72 hours. Sepsis Labs: No results for input(s): PROCALCITON, LATICACIDVEN in the last 168 hours.  Recent Results (from the past 240 hour(s))  Surgical pcr screen     Status: None   Collection Time: 05/18/19  5:38 PM  Result Value Ref Range Status   MRSA, PCR NEGATIVE NEGATIVE Final   Staphylococcus aureus NEGATIVE NEGATIVE Final    Comment: (NOTE) The Xpert SA  Assay (FDA approved for NASAL specimens in patients 31 years of age and older), is one component of Leilanie Rauda comprehensive surveillance program. It is not intended to diagnose infection nor to guide or monitor treatment. Performed at [facility], [place] [address], [place], [place] [address]   Expectorated sputum assessment w rflx to resp cult     Status: None   Collection Time: 05/18/19  7:25 PM  Result Value Ref Range Status   Specimen Description EXPECTORATED SPUTUM  Final   Special Requests Normal  Final   Sputum evaluation   Final    THIS SPECIMEN IS ACCEPTABLE FOR SPUTUM CULTURE Performed at [facility], [place] [address], [place], [place] [address]    Report Status 05/18/2019 FINAL  Final  Culture, respiratory     Status: None   Collection Time: 05/18/19  7:25 PM  Result Value Ref Range Status   Specimen Description EXPECTORATED SPUTUM  Final   Special Requests Normal Reflexed from [id number]  Final   Gram Stain   Final    RARE WBC PRESENT,BOTH PMN AND MONONUCLEAR FEW GRAM POSITIVE COCCI RARE GRAM NEGATIVE RODS    Culture   Final    FEW Consistent with normal respiratory flora. Performed at [facility], [place] [address], [place], [place] [address]    Report Status 05/21/2019 FINAL  Final  Anaerobic culture     Status: None   Collection Time: 05/20/19  8:35 AM  Result Value Ref Range Status   Specimen Description PLEURAL  Final   Special Requests RIGHT  Final   Culture   Final    NO ANAEROBES ISOLATED Performed at [facility], [place] [address] [place], [place] [address]    Report Status 05/25/2019 FINAL  Final  Fungus Culture With Stain     Status: None (Preliminary result)   Collection Time: 05/20/19  8:35 AM  Result Value Ref Range Status   Fungus Stain Final report  Final    Comment: (NOTE) Performed At: [facility] [place], [place] [id number] Rush Farmer MD BZ:[id number]    Fungus (Mycology) Culture PENDING  Incomplete    Fungal Source PLEURAL  Final    Comment: Performed at [facility], [place] [address], [place], [place] [address]  Acid Fast Smear (AFB)     Status: None   Collection Time: 05/20/19  8:35 AM  Result Value Ref Range Status   AFB Specimen Processing Concentration  Final   Acid Fast Smear Negative  Final    Comment: (NOTE) Performed At: [facility] [place], [place] [id number] Rush Farmer MD EU:[id number]    Source (AFB) PLEURAL  Final    Comment: Performed at [facility], [place] [address], [place], [place] [address]  Fungus Culture Result     Status: None   Collection Time: 05/20/19  8:35 AM  Result Value Ref Range Status   Result 1 Comment  Final    Comment: (NOTE) KOH/Calcofluor preparation:  no fungus observed. Performed At: [facility] [place], [place] [id number] Rush Farmer MD OI:[id number]   Aerobic Culture (superficial specimen)     Status: None   Collection Time: 05/20/19  8:39 AM  Result Value Ref Range Status   Specimen Description PLEURAL  Final  Special Requests RIGHT  Final   Gram Stain   Final    FEW WBC PRESENT, PREDOMINANTLY PMN NO ORGANISMS SEEN    Culture   Final    NO GROWTH 2 DAYS Performed at [place] [address] [place], [place] [address]    Report Status 05/22/2019 FINAL  Final  Culture, blood (routine x 2)     Status: None (Preliminary result)   Collection Time: 05/24/19 11:26 AM  Result Value Ref Range Status   Specimen Description BLOOD LEFT HAND  Final   Special Requests   Final    BOTTLES DRAWN AEROBIC ONLY Blood Culture adequate volume   Culture   Final    NO GROWTH 3 DAYS Performed at [facility], [place] [address] [place], [place] [address]    Report Status PENDING  Incomplete  Culture, blood (routine x 2)     Status: None (Preliminary result)   Collection Time: 05/24/19 11:27 AM  Result Value Ref Range Status   Specimen Description BLOOD LEFT HAND  Final    Special Requests   Final    BOTTLES DRAWN AEROBIC AND ANAEROBIC Blood Culture results may not be optimal due to an excessive volume of blood received in culture bottles   Culture   Final    NO GROWTH 3 DAYS Performed at [facility], [place] [address], [place], [place] [address]    Report Status PENDING  Incomplete  Culture, Urine     Status: Abnormal   Collection Time: 05/25/19  5:13 PM  Result Value Ref Range Status   Specimen Description URINE, RANDOM  Final   Special Requests   Final    NONE Performed at [facility], [place] [address], [place], [place] [address]    Culture >=100,000 COLONIES/mL PSEUDOMONAS AERUGINOSA (Jeson Camacho)  Final   Report Status 05/27/2019 FINAL  Final   Organism ID, Bacteria PSEUDOMONAS AERUGINOSA (Gabbie Marzo)  Final      Susceptibility   Pseudomonas aeruginosa - MIC*    CEFTAZIDIME <=1 SENSITIVE Sensitive     CIPROFLOXACIN 1 SENSITIVE Sensitive     GENTAMICIN <=1 SENSITIVE Sensitive     IMIPENEM 8 INTERMEDIATE Intermediate     PIP/TAZO <=4 SENSITIVE Sensitive     CEFEPIME <=1 SENSITIVE Sensitive     * >=100,000 COLONIES/mL PSEUDOMONAS AERUGINOSA  Novel Coronavirus, NAA (hospital order; send-out to ref lab)     Status: None   Collection Time: 05/25/19 10:21 PM  Result Value Ref Range Status   SARS-CoV-2, NAA NOT DETECTED NOT DETECTED Final    Comment: (NOTE) This test was developed and its performance characteristics determined by [employer]. This test has not been FDA cleared or approved. This test has been authorized by FDA under an Emergency Use Authorization (EUA). This test is only authorized for the duration of time the declaration that circumstances exist justifying the authorization of the emergency use of in vitro diagnostic tests for detection of SARS-CoV-2 virus and/or diagnosis of COVID-19 infection under section 564(b)(1) of the Act, 21 U.S.C. 295AOZ-3(Y)(8), unless the authorization is terminated or revoked sooner. When diagnostic  testing is negative, the possibility of Braeden Dolinski false negative result should be considered in the context of Evelean Bigler patient's recent exposures and the presence of clinical signs and symptoms consistent with COVID-19. An individual without symptoms of COVID-19 and who is not shedding SARS-CoV-2 virus would expect to have Cleofas Hudgins negative (not detected) result in this assay. Performed  At: [facility]  [place], [place] [id number] Rush Farmer MD ZD:[id number]    Coronavirus Source NASOPHARYNGEAL  Final    Comment: Performed at [facility], [place] [address], [place], [place] [address]         Radiology Studies: Dg Chest Port 1 View  Result Date: 05/26/2019 CLINICAL DATA:  Follow-up VATS EXAM: PORTABLE CHEST 1 VIEW COMPARISON:  05/25/2019 FINDINGS: Cardiac shadow is stable. Postsurgical changes are again noted. Small right pleural effusion and right basilar atelectasis are noted. PleurX catheter is noted on the right and stable. IMPRESSION: Stable right pleural effusion and basilar atelectasis. PleurX catheter is stable in appearance. Electronically Signed   By: Inez Catalina M.D.   On: 05/26/2019 07:54        Scheduled Meds: . aspirin EC  81 mg Oral Daily  . bisacodyl  10 mg Oral Daily  . carbidopa-levodopa  1 tablet Oral TID  . Chlorhexidine Gluconate Cloth  6 each Topical Daily  . ciprofloxacin  250 mg Oral BID  . fluticasone furoate-vilanterol  1 puff Inhalation Daily  . folic acid  1 mg Oral Daily  . heparin  5,000 Units Subcutaneous Q8H  . levothyroxine  200 mcg Oral QHS  . magnesium oxide  400 mg Oral BID  . pantoprazole  40 mg Oral Daily  . predniSONE  10 mg Oral Q breakfast  . rOPINIRole  0.5 mg Oral QHS  . senna-docusate  1 tablet Oral QHS  . sodium chloride flush  3 mL Intravenous Q12H  . ticagrelor  90 mg Oral BID  . [START ON 06/01/2019] tiZANidine  4 mg Oral QHS  . traZODone  50 mg Oral QHS   Continuous Infusions: . sodium chloride 100 mL/hr at  05/26/19 1317  . sodium chloride    . potassium chloride       LOS: 12 days    Time spent: over 30 min    Fayrene Helper, MD [facility] Pager AMION  If 7PM-7AM, please contact night-coverage [email or web address] Password [facility] 05/27/2019, 7:19 PM

## 2019-05-27 NOTE — Progress Notes (Addendum)
Arterial sheath removed at 0626. Manual pressure held for 21 mins. Site level 0, no bleeding or bruising noted. Pt educated on site care. Bedrest for 6 hours, over at 1230.

## 2019-05-27 NOTE — Progress Notes (Signed)
CARDIAC REHAB PHASE I   PRE:  Rate/Rhythm: 78 SR  BP:  Supine:   Sitting: 124/72  Standing:    SaO2: 100%RA  MODE:  Ambulation: 370 ft   POST:  Rate/Rhythm: 87 SR  BP:  Supine:   Sitting: 137/78  Standing:    SaO2: 95%RA 1335-1425 Pt walked 370 ft on RA with EVA and asst x 1. Pt did not need to rest but was tired by end of walk. Needed asst x 1 for safety. Would recommend PT eval for possible need for HHPT which pt stated he has had before and he would like a rollator. Generalized weakness. Stated a little tight in chest that improved with rest. Sats good on RA. Pt stated he feels like he needs to take a deep breath every once in a while . This is since his cath. ?brilinta.  Discussed with RN. Reviewed MI restrictions, NTG use, importance of brilinta with stent, gave heart healthy diet and encouraged walking with asst at home until stronger. Discussed CRP 2 and will refer to [facility] program. Pt voiced understanding. Stated he will have grandson who is Paramedic review ed with him at home.   Graylon Good, RN BSN  05/27/2019 2:16 PM

## 2019-05-28 ENCOUNTER — Inpatient Hospital Stay (HOSPITAL_COMMUNITY): Payer: Medicare Other

## 2019-05-28 LAB — CBC
HCT: 36.9 % — ABNORMAL LOW (ref 39.0–52.0)
Hemoglobin: 12.2 g/dL — ABNORMAL LOW (ref 13.0–17.0)
MCH: 30.5 pg (ref 26.0–34.0)
MCHC: 33.1 g/dL (ref 30.0–36.0)
MCV: 92.3 fL (ref 80.0–100.0)
Platelets: 271 10*3/uL (ref 150–400)
RBC: 4 MIL/uL — ABNORMAL LOW (ref 4.22–5.81)
RDW: 15.2 % (ref 11.5–15.5)
WBC: 12.8 10*3/uL — ABNORMAL HIGH (ref 4.0–10.5)
nRBC: 0 % (ref 0.0–0.2)

## 2019-05-28 LAB — COMPREHENSIVE METABOLIC PANEL
ALT: 8 U/L (ref 0–44)
AST: 21 U/L (ref 15–41)
Albumin: 2.7 g/dL — ABNORMAL LOW (ref 3.5–5.0)
Alkaline Phosphatase: 109 U/L (ref 38–126)
Anion gap: 11 (ref 5–15)
BUN: 10 mg/dL (ref 8–23)
CO2: 25 mmol/L (ref 22–32)
Calcium: 9.1 mg/dL (ref 8.9–10.3)
Chloride: 101 mmol/L (ref 98–111)
Creatinine, Ser: 1.15 mg/dL (ref 0.61–1.24)
GFR calc Af Amer: >60 mL/min (ref >60)
GFR calc non Af Amer: 59 mL/min — ABNORMAL LOW (ref >60)
Glucose, Bld: 112 mg/dL — ABNORMAL HIGH (ref 70–99)
Potassium: 3.7 mmol/L (ref 3.5–5.1)
Sodium: 137 mmol/L (ref 135–145)
Total Bilirubin: 0.6 mg/dL (ref 0.3–1.2)
Total Protein: 6 g/dL — ABNORMAL LOW (ref 6.5–8.1)

## 2019-05-28 MED ORDER — CIPROFLOXACIN HCL 250 MG PO TABS: 250.0000 mg | ORAL_TABLET | Freq: Two times a day (BID) | ORAL | 0 refills | Status: DC

## 2019-05-28 MED ORDER — TICAGRELOR 90 MG PO TABS: 90.0000 mg | ORAL_TABLET | Freq: Two times a day (BID) | ORAL | 2 refills | Status: DC

## 2019-05-28 MED ORDER — TICAGRELOR 90 MG PO TABS: 90.0000 mg | ORAL_TABLET | Freq: Two times a day (BID) | ORAL | 2 refills | Status: AC

## 2019-05-28 MED ORDER — ASPIRIN EC 81 MG PO TBEC: 81.0000 mg | DELAYED_RELEASE_TABLET | Freq: Every day | ORAL | 2 refills | Status: DC

## 2019-05-28 MED ORDER — CIPROFLOXACIN HCL 250 MG PO TABS: 250.0000 mg | ORAL_TABLET | Freq: Two times a day (BID) | ORAL | 0 refills | Status: AC

## 2019-05-28 MED ORDER — MAGNESIUM OXIDE 400 (241.3 MG) MG PO TABS: 400.0000 mg | ORAL_TABLET | Freq: Two times a day (BID) | ORAL | 0 refills | Status: DC

## 2019-05-28 MED ORDER — MAGNESIUM OXIDE 400 (241.3 MG) MG PO TABS: 400.0000 mg | ORAL_TABLET | Freq: Two times a day (BID) | ORAL | 0 refills | Status: AC

## 2019-05-28 NOTE — Progress Notes (Signed)
2 Days Post-Op Procedure(s) (LRB): LEFT HEART CATH AND CORS/GRAFTS ANGIOGRAPHY (N/A) CORONARY STENT INTERVENTION (N/A) Subjective: A little short of breath this AM  Objective: Vital signs in last 24 hours: Temp:  [97.5 F (36.4 C)-98.1 F (36.7 C)] 97.7 F (36.5 C) (06/06 0407) Pulse Rate:  [60-87] 63 (06/06 0407) Cardiac Rhythm: Normal sinus rhythm (06/06 0700) Resp:  [18-32] 23 (06/05 2343) BP: (120-165)/(62-82) 131/71 (06/06 0407) SpO2:  [92 %-100 %] 97 % (06/06 0729) Weight:  [95.7 kg] 95.7 kg (06/06 0407)  Hemodynamic parameters for last 24 hours:    Intake/Output from previous day: 06/05 0701 - 06/06 0700 In: 273 [P.O.:270; I.V.:3] Out: 1550 [Urine:1400; Chest Tube:150] Intake/Output this shift: Total I/O In: 25 [P.O.:25] Out: -   General appearance: alert, cooperative and no distress Neurologic: intact Heart: regular rate and rhythm Lungs: diminished breath sounds right base Wound: clean  Lab Results: Recent Labs    05/27/19 0349 05/28/19 0238  WBC 14.0* 12.8*  HGB 11.9* 12.2*  HCT 36.0* 36.9*  PLT 259 271   BMET:  Recent Labs    05/27/19 0349 05/28/19 0238  NA 139 137  K 4.0 3.7  CL 103 101  CO2 26 25  GLUCOSE 101* 112*  BUN 11 10  CREATININE 1.24 1.15  CALCIUM 9.1 9.1    PT/INR: No results for input(s): LABPROT, INR in the last 72 hours. ABG    Component Value Date/Time   PHART 7.467 (H) 05/23/2019 2208   HCO3 26.0 05/23/2019 2208   TCO2 27 05/23/2019 2208   ACIDBASEDEF 1.0 05/21/2019 0420   O2SAT 97.0 05/23/2019 2208   CBG (last 3)  No results for input(s): GLUCAP in the last 72 hours.  Assessment/Plan: S/P Procedure(s) (LRB): LEFT HEART CATH AND CORS/GRAFTS ANGIOGRAPHY (N/A) CORONARY STENT INTERVENTION (N/A) - Pleural catheter drained 150 ml yesterday Continue draining daily while in hospital Drain MWF with [facility] after discharged Path sent out S/p non-STEMI with PCi- per Cardiology   LOS: 13 days    Melrose Nakayama 05/28/2019

## 2019-05-28 NOTE — Progress Notes (Signed)
Reviewed ed with pt. Has decent understanding. Reemphasized Brilinta. He is hoping for a rollator at d/c [place] Awaiting PT today, will not ambulate so he can go with them soon. [id number] [place], ACSM 11:23 AM 05/28/2019

## 2019-05-28 NOTE — Discharge Summary (Signed)
Physician Discharge Summary  Jeffrey [name]:[id number] DOB: June 17, 1934 DOA: 05/14/2019  PCP: [name], MD  Admit date: 05/14/2019 Discharge date: 05/28/2019  Time spent: 40 minutes  Recommendations for Outpatient Follow-up:  1. Follow up outpatient CBC/CMP  2. Follow up final bx results with CT surgery from VATS 3. Follow up with CT surgery outpatient  4. Follow up cardiology outpatient  5. Losartan d/c'd inpatient due to soft BP's, follow outpatient and resume when able 6. DAPT with ASA 81 mg and brilinta 90 mg BID x3 months, then brilinta for 9 months 7. Tizanidine d/c'd while pt on cipro, consider restarting after completion of abx for UTI 8. Repeat imaging in about 1 year for ectatic thoracic aorta and pancreatic lesion as noted below    Discharge Diagnoses:  Principal Problem:   Pleural effusion on right Active Problems:   CAD (coronary artery disease) of artery bypass graft   Hypothyroidism   PD (Parkinson's disease) (HCC)   Pancreatic lesion   Shortness of breath   Coronary artery disease due to lipid rich plaque   Elevated troponin   Coronary artery disease involving native coronary artery of native heart with unstable angina pectoris [facility])   Discharge Condition: stable  Diet recommendation: heart healthy  Filed Weights   05/24/19 0630 05/27/19 0500 05/28/19 0407  Weight: 100.1 kg 99.1 kg 95.7 kg    History of present illness:  Jeffrey Asch Pruittis an 85 y.o.malew CAD s/p CABG, ischemic CM, mild Aortic stenosis, Asthma (mild), OSA, Hypothyroidism, Parkinsons, Anxiety, remote hx of MRSE septic arthritis left knee s/p L knee ID/ synovectomy, L THA, Anemia/ ITP apparently presents with c/o cough with white->slight yellow sputum since January of this year. Pt notes dyspnea for the past 4 weeks. Worse over the past 3-4 day  He was noted to have loculated pleural effusion and underwent thoracentesis and then VATS with bx.  Concern for mesothelioma, surgical path  from 5/29 is pending.  He developed NSTEMI post op and had PCI to proximal LCx with DES.   He now has pleurx for R sided effusion.  Plan for d/c with outpatient follow up.  See below for further details  Hospital Course:  Right pleural effusion - s/p thoracentesis on 5/25 by pulm -> lymphocytic exudate with low glucose (thought unlikely rheumatoid effusion despite low glucose).  Cytology was negative.  Concern for mesothelioma given pleural plaque.   - s/p R VATS for drainage of loculated R pleural effusion, R pleural biopsies, talc pleurodesis of R pleural space, placement of R pleurx catheter on 5/29  - recommending MWF draining at discharge - Pleural biopsy pending   - Recent CXR 6/3 with small R pleural effusion and R basilar atelecatasis - CXR today with bibasilar opacities, small R>L effusions - Daily pleurx drainage per CT surgery  -> drain MWF when discharged - Echo with EF 55-60% - 5/25 body fluid cx ngtd, acid fast smear negative, fungal cx negative, acid fast cx pending  - 5/27 cx with normal resp flora - 5/29 acid fast negative, acid fast cx pending, anaerobic cx negative, fungal cx negative, aerobic cx ngtd - Blood cx 6/2 NGTD  NSTEMI  CAD s/p CABG, DES Lcx, DES LAD - episode of CP with ST depression and troponin to 1.07 while ambulating.   - appreciate cardiology recs -> CT PE protocol without PE - Now s/p catheterization per cardiology 6/4 -> s/p PCI to proximal LCx with DES.  Recommending ASA/brilinta x 3 months and then brilinta  x 9 months.  (see op note) - Recommending f/u with dr. Sabra Heck in [place].   Copd w/o exacerbation - Cont Breo 1puff qday and albuterol neb bid  H/o Rheumatoid arthritis - HOLD methotrexate while inpatient  - Cont Prednisone 10mg  po qday and Folic acid 1mg  po qday  Hypothyroidism - Cont Levothyroxine 233micrograms po qday  Anxiety - Xanax 0.25mg  po bid prn  Parkinson - Cont Sinemet IR 25/100mg  po tid  RLS - Cont  Ropinirole 0.5mg  po qhs  - hold zanaflex while on cipro  Gerd - Cont Protonix  Pancreatic lesion - MRI as outpatient  Ectatic Thoracic Aorta - follow outpatient per rads, see report below  Constipation - PRN miralax  Back pain - history of disc problems per pt - no complaints of back pain today, continue to monitor, consider further imaging if recurrent or persistent   Leukocytosis  Urinary Tract Infection - leukocytosis improving, afebrile - UA with + nitrite and leukocytes.  Also with WBC's.  Foley on May 29, removed 31st.   - Pt symptomatic per nursing. - Follow urine cx - 100,000 gram negative rods -> pseudomonas, on cipro (holding zanaflex while on cipro) - Follow blood cx - CXR as noted above - Will start ceftriaxone for now and continue to monitor  Insomnia: will try trazodone  Procedures: Echo IMPRESSIONS   1. The left ventricle has normal systolic function, with an ejection fraction of 55-60%. The cavity size was normal. Left ventricular diastolic Doppler parameters are consistent with impaired relaxation. 2. The right ventricle has normal systolic function. The cavity was normal. There is no increase in right ventricular wall thickness. 3. The aortic valve is abnormal. Moderate thickening of the aortic valve. Moderate calcification of the aortic valve. Aortic valve regurgitation is mild by color flow Doppler. Mild stenosis of the aortic valve. Moderate aortic annular calcification  noted. 4. There is mild dilatation of the aortic root. 5. The interatrial septum was not assessed.  LE [place] Summary: Right: There is no evidence of deep vein thrombosis in the lower extremity. However, portions of this examination were limited- see technologist comments above. No cystic structure found in the popliteal fossa. Left: There is no evidence of deep vein thrombosis in the lower extremity. No cystic structure found in the popliteal fossa.  Thoracentesis  5/25  s/p R VATS for drainage of loculated R pleural effusion, R pleural biopsies, talc pleurodesis of R pleural space, placement of R pleurx catheter on 5/29   Consultations:  CT surgery  cards  Discharge Exam: Vitals:   05/28/19 1228 05/28/19 1553  BP: 110/60 128/72  Pulse:    Resp:    Temp: 98.1 F (36.7 C) 98 F (36.7 C)  SpO2: 97%    Feels ok Would like to go home Discussed with grandson and wife  General: No acute distress. Cardiovascular: Heart sounds show a regular rate, and rhythm.  Lungs: Clear to auscultation bilaterally Abdomen: Soft, nontender, nondistended Neurological: Alert and oriented 3. Moves all extremities 4 . Cranial nerves II through XII grossly intact. Skin: Warm and dry. No rashes or lesions. Extremities: No clubbing or cyanosis. No edema.  Discharge Instructions   Discharge Instructions    Amb Referral to Cardiac Rehabilitation   Complete by:  As directed    Referring to [facility] CRP 2   Diagnosis:   NSTEMI Coronary Stents     After initial evaluation and assessments completed: Virtual Based Care may be provided alone or in conjunction with Phase 2  Cardiac Rehab based on patient barriers.:  Yes   Call MD for:  difficulty breathing, headache or visual disturbances   Complete by:  As directed    Call MD for:  extreme fatigue   Complete by:  As directed    Call MD for:  persistant dizziness or light-headedness   Complete by:  As directed    Call MD for:  persistant nausea and vomiting   Complete by:  As directed    Call MD for:  redness, tenderness, or signs of infection (pain, swelling, redness, odor or green/yellow discharge around incision site)   Complete by:  As directed    Call MD for:  severe uncontrolled pain   Complete by:  As directed    Call MD for:  temperature >100.4   Complete by:  As directed    Diet - low sodium heart healthy   Complete by:  As directed    Discharge instructions   Complete by:  As directed    You  were seen for a pleural effusion.    You had a VATS procedure and now have a pleurx catheter. This should be drained Monday, Wednesday, and Friday.   The biopsy from the procedure is still pending.  There is concern for mesothelioma, so this is very important to follow up.   You had a heart attack.  You had a cardiac catheterization with a stent that was placed.  You'll take aspirin and brilinta for 3 months, then brilinta for another 9 months.  Please follow up with cardiology and CT surgery as an outpatient.   You have a urinary tract infection.  We'll send you home with an additional 4 days of ciprofloxacin.  Do not take your zanaflex while taking ciprofloxacin.  You can resume your zanaflex when you're done with your antibiotics.  Your losartan was discontinued here.  Please follow up with your PCP and cardiologist to determine when and if this should be restarted.  You need a follow up imaging study in about a year to follow up a pancreatic lesion and ectatic aorta.   Return for new, recurrent, or worsening symptoms.  Please ask your PCP to request records from this hospitalization so they know what was done and what the next steps will be.   Increase activity slowly   Complete by:  As directed      Allergies as of 05/28/2019      Reactions   Lipitor [atorvastatin] Other (See Comments)   muscle spasms cramps   Methylprednisolone Other (See Comments)   cramps cramps   Other    If patient is to receive blood - blood must be treated witgh radiation because his body will not accept a normal infusion    Sulfa Antibiotics Itching   Doxycycline Rash   Doxycycline caused itching redness on scalp and head Doxycycline caused itching redness on scalp and head      Medication List    STOP taking these medications   azithromycin 250 MG tablet Commonly known as:  ZITHROMAX   losartan 50 MG tablet Commonly known as:  COZAAR   tiZANidine 4 MG tablet Commonly known as:  Zanaflex      TAKE these medications   ALPRAZolam 0.25 MG tablet Commonly known as:  XANAX Take 1 tablet (0.25 mg total) by mouth 2 (two) times daily as needed for anxiety.   aspirin EC 81 MG tablet Take 1 tablet (81 mg total) by mouth daily.   carbidopa-levodopa 25-100 MG  tablet Commonly known as:  SINEMET IR Take 1 tablet by mouth 3 (three) times daily.   ciprofloxacin 250 MG tablet Commonly known as:  CIPRO Take 1 tablet (250 mg total) by mouth 2 (two) times daily for 4 days.   Fish Oil 1200 MG Caps Take 1,200 mg by mouth at bedtime. 360 MG OMEGA-3   Fluticasone-Salmeterol 100-50 MCG/DOSE Aepb Commonly known as:  ADVAIR Inhale 1 puff into the lungs daily.   folic acid 1 MG tablet Commonly known as:  FOLVITE Take 1 mg by mouth daily.   levothyroxine 200 MCG tablet Commonly known as:  SYNTHROID Take 200 mcg by mouth at bedtime.   magnesium oxide 400 (241.3 Mg) MG tablet Commonly known as:  MAG-OX Take 1 tablet (400 mg total) by mouth 2 (two) times daily for 30 days.   methotrexate 2.5 MG tablet Commonly known as:  RHEUMATREX Take 15 mg by mouth once a week. Caution:Chemotherapy. Protect from light.   omeprazole 40 MG capsule Commonly known as:  PRILOSEC Take 40 mg by mouth at bedtime.   predniSONE 10 MG tablet Commonly known as:  DELTASONE Take 10 mg by mouth daily with breakfast.   rOPINIRole 0.5 MG tablet Commonly known as:  REQUIP Take 0.5 mg by mouth at bedtime.   ticagrelor 90 MG Tabs tablet Commonly known as:  BRILINTA Take 1 tablet (90 mg total) by mouth 2 (two) times daily.   Vitamin D (Ergocalciferol) 1.25 MG (50000 UT) Caps capsule Commonly known as:  DRISDOL Take 50,000 Units by mouth every 7 (seven) days.            Durable Medical Equipment  (From admission, onward)         Start     Ordered   05/28/19 1446  For home use only DME Walker rolling  [facility])  Once    Comments:  rollator  Question:  Patient needs a walker to treat with the  following condition  Answer:  Physical deconditioning   05/28/19 1459         Allergies  Allergen Reactions  . Lipitor [Atorvastatin] Other (See Comments)    muscle spasms cramps  . Methylprednisolone Other (See Comments)    cramps cramps  . Other     If patient is to receive blood - blood must be treated witgh radiation because his body will not accept a normal infusion   . Sulfa Antibiotics Itching  . Doxycycline Rash    Doxycycline caused itching redness on scalp and head Doxycycline caused itching redness on scalp and head   Follow-up Information    Lauraine Rinne, NP Follow up on 06/16/2019.   Specialty:  Pulmonary Disease Why:  9am  Contact information: [address] [place] [address] [phone]        Ivin Poot, MD Follow up on 06/17/2019.   Specialty:  Cardiothoracic Surgery Why:  Appointment is at 10:30, please get CXR at 10:00 at [facility] imaging located on first floor of our office building Contact information: [address] [place] [address] [phone]        [facility] Follow up on 06/02/2019.   Specialty:  Cardiothoracic Surgery Why:  Appointment is at 10:15 for suture removal Contact information: [place], [place]       [name], MD Follow up.   Specialties:  Internal Medicine, Infectious Diseases Contact information: Internal Medicine  [place] [address] [place] Follow up.   Why:  will follow with you to schedule nursing, physical therapy, and an aide       [place] Follow up.   Why:  provided the rollator (walker with a seat) Contact information: [place] [address] [phone]            The results of significant diagnostics from this hospitalization (including imaging,  microbiology, ancillary and laboratory) are listed below for reference.    Significant Diagnostic Studies: Dg Chest 2 View  Result Date: 05/28/2019 CLINICAL DATA:  Chest tube in place. EXAM: CHEST - 2 VIEW COMPARISON:  Chest radiograph 05/26/2019 FINDINGS: Monitoring leads overlie the patient. Stable cardiomegaly. Tortuosity of the thoracic aorta. Right-greater-than-left basilar heterogeneous opacities. Small bilateral pleural effusions. Osseous structures unremarkable. IMPRESSION: Stable bibasilar opacities favored represent atelectasis with small bilateral pleural effusions, right-greater-than-left. Electronically Signed   By: Lovey Newcomer M.D.   On: 05/28/2019 08:16   Ct Angio Chest Pe W Or Wo Contrast  Result Date: 05/23/2019 CLINICAL DATA:  Tachycardia.  Back pain. EXAM: CT ANGIOGRAPHY CHEST WITH CONTRAST TECHNIQUE: Multidetector CT imaging of the chest was performed using the standard protocol during bolus administration of intravenous contrast. Multiplanar CT image reconstructions and MIPs were obtained to evaluate the vascular anatomy. CONTRAST:  75mL OMNIPAQUE IOHEXOL 350 MG/ML SOLN COMPARISON:  CT dated 05/14/2019 FINDINGS: Cardiovascular: There is no PE identified. There are coronary artery calcifications. Atherosclerotic changes are noted of the thoracic aorta. There is no large pericardial effusion. Aortic calcifications are noted. The thoracic aorta measures up to 4.2 cm. Mediastinum/Nodes: No enlarged mediastinal, hilar, or axillary lymph nodes. Thyroid gland, trachea, and esophagus demonstrate no significant findings. Lungs/Pleura: There is been significant interval decrease in size of the previously noted right-sided pleural effusion. There is a small residual loculated right-sided hydropneumothorax. There is atelectasis involving the right lung base. Calcified pleural based plaques are again noted. There is atelectasis at the left lung base with a few calcified pleural based plaques. A  right-sided chest tube is in place. Upper Abdomen: No acute abnormality. Musculoskeletal: No chest wall abnormality. No acute or significant osseous findings. Review of the MIP images confirms the above findings. IMPRESSION: 1. No PE. 2. Significant interval decrease in size of the previously demonstrated right-sided pleural effusion. A right-sided chest tube is in place with a small residual right-sided hydropneumothorax. 3. Again identified are calcified pleural based plaques bilaterally. 4. Additional chronic findings as previously described. Aortic Atherosclerosis (ICD10-I70.0). Electronically Signed   By: Constance Holster M.D.   On: 05/23/2019 21:31   Ct Angio Chest Pe W/cm &/or Wo Cm  Result Date: 05/14/2019 CLINICAL DATA:  Shortness of breath EXAM: CT ANGIOGRAPHY CHEST WITH CONTRAST TECHNIQUE: Multidetector CT imaging of the chest was performed using the standard protocol during bolus administration of intravenous contrast. Multiplanar CT image reconstructions and MIPs were obtained to evaluate the vascular anatomy. CONTRAST:  14mL OMNIPAQUE IOHEXOL 350 MG/ML SOLN COMPARISON:  Correlation made with chest x-ray from the same day FINDINGS: Cardiovascular: The patient is status post prior CABG. Aortic calcifications are noted. There is no pulmonary embolus identified. The ascending aorta is ectatic measuring up to approximately 4.2 cm in diameter. Dense coronary artery calcifications are noted. Mediastinum/Nodes: No enlarged mediastinal, hilar, or axillary lymph nodes. Thyroid gland, trachea, and esophagus demonstrate no significant findings. There are no pathologically enlarged axillary or supraclavicular lymph  nodes. Lungs/Pleura: There is a large right-sided pleural effusion that is likely to some degree loculated. Multiple calcified pleural based plaques are noted primarily at the right lung base. There is some mild areas of pleural thickening superiorly. There is near complete collapse of the right  lower lobe. The trachea is unremarkable. There is partial collapse of the right middle lobe. Upper Abdomen: There is cholelithiasis without CT evidence of acute cholecystitis. There is a 2 point 1 cm cystic appearing lesion near the pancreatic body (axial series 5, image 329). The visualized stomach is unremarkable. There may be a small nonobstructing stone versus vascular calcification in the interpolar region of the left kidney. Musculoskeletal: No chest wall abnormality. No acute or significant osseous findings. Review of the MIP images confirms the above findings. IMPRESSION: 1. No PE. 2. Large, likely somewhat loculated, right-sided pleural effusion with near complete collapse of the right lower lobe. 3. Multiple calcified pleural based plaques are noted on the right. There is some mild pleural thickening towards the right lung apex. Findings can be seen in patients with prior asbestos exposure. 4. Ectatic thoracic aorta measuring approximately 4.2 cm in diameter. Recommend annual imaging followup by CTA or MRA. This recommendation follows 2010 ACCF/AHA/AATS/ACR/ASA/SCA/SCAI/SIR/STS/SVM Guidelines for the Diagnosis and Management of Patients with Thoracic Aortic Disease. Circulation. 2010; 121: G295-M841. Aortic aneurysm NOS (ICD10-I71.9) 5. Cholelithiasis without CT evidence of acute cholecystitis. 6. 2.1 cm cystic pancreatic lesion. Follow-up contrast enhanced MRI is recommended 1 year. This is favored to represent a benign etiology given its appearance. Aortic Atherosclerosis (ICD10-I70.0). Electronically Signed   By: Constance Holster M.D.   On: 05/14/2019 21:26   Dg Chest Port 1 View  Result Date: 05/26/2019 CLINICAL DATA:  Follow-up VATS EXAM: PORTABLE CHEST 1 VIEW COMPARISON:  05/25/2019 FINDINGS: Cardiac shadow is stable. Postsurgical changes are again noted. Small right pleural effusion and right basilar atelectasis are noted. PleurX catheter is noted on the right and stable. IMPRESSION: Stable  right pleural effusion and basilar atelectasis. PleurX catheter is stable in appearance. Electronically Signed   By: Inez Catalina M.D.   On: 05/26/2019 07:54   Dg Chest Port 1 View  Result Date: 05/25/2019 CLINICAL DATA:  Recent VATS EXAM: PORTABLE CHEST 1 VIEW COMPARISON:  05/24/2019 FINDINGS: Cardiac shadow is stable. Postsurgical changes are again seen. Small right pleural effusion is again noted and stable. Mild right basilar atelectasis is noted. No bony abnormality is noted. IMPRESSION: Small right pleural effusion and right basilar atelectasis. Electronically Signed   By: Inez Catalina M.D.   On: 05/25/2019 08:49   Dg Chest Port 1 View  Result Date: 05/24/2019 CLINICAL DATA:  Status post chest tube placement EXAM: PORTABLE CHEST 1 VIEW COMPARISON:  05/23/2019 FINDINGS: Cardiac shadow is enlarged but stable. Postsurgical changes are noted. Right-sided pleural effusion is again seen with small PleurX catheter noted in place. No pneumothorax is seen. Left lung is clear. IMPRESSION: Right-sided pleural effusion stable from the prior exam. No new focal abnormality is seen. Electronically Signed   By: Inez Catalina M.D.   On: 05/24/2019 07:17   Dg Chest Port 1 View  Result Date: 05/23/2019 CLINICAL DATA:  Chest tube removal. EXAM: PORTABLE CHEST 1 VIEW COMPARISON:  Radiograph of same day. FINDINGS: Stable cardiomediastinal silhouette. Right-sided chest tube is unchanged in position. No pneumothorax is noted. Left lung is clear. Stable right basilar atelectasis is noted with probable minimal pleural effusion. IMPRESSION: Stable position of right-sided chest tube is noted. No pneumothorax is noted. Stable  right basilar atelectasis is noted. Electronically Signed   By: Marijo Conception M.D.   On: 05/23/2019 18:44   Dg Chest Port 1 View  Result Date: 05/23/2019 CLINICAL DATA:  85 year old male status post right side VATS with talc pleurodesis postoperative day 3. EXAM: PORTABLE CHEST 1 VIEW COMPARISON:   05/22/2019 and earlier. FINDINGS: Portable AP semi upright view at 0530 hours. Stable right chest tubes. No pneumothorax identified. Stable lung volumes and ventilation with patchy right lung base opacity. Stable cardiac size and mediastinal contours. Prior CABG. Paucity of bowel gas in the upper abdomen. No acute osseous abnormality identified. IMPRESSION: Stable chest tubes and postoperative appearance of the right chest. No pneumothorax . Electronically Signed   By: Genevie Ann M.D.   On: 05/23/2019 06:35   Dg Chest Port 1 View  Result Date: 05/22/2019 CLINICAL DATA:  Chest tube in place EXAM: PORTABLE CHEST 1 VIEW COMPARISON:  05/20/2019 FINDINGS: Interval extubation. Medial right chest tube.  No pneumothorax is seen. Mild right basilar opacity, likely a combination of mildly complex pleural effusion and atelectasis. Left lung is clear. The heart is normal in size. Postsurgical changes related to prior CABG. Median sternotomy. IMPRESSION: Interval extubation. Stable right chest tube.  No pneumothorax is seen. Stable right basilar opacity, likely combination of atelectasis and complex pleural effusion. Electronically Signed   By: Julian Hy M.D.   On: 05/22/2019 06:46   Dg Chest Portable 1 View  Result Date: 05/20/2019 CLINICAL DATA:  VATS. EXAM: PORTABLE CHEST 1 VIEW COMPARISON:  05/16/2019. FINDINGS: Endotracheal tube tip approximately 1.9 cm above the carina. Right chest tube noted medial right chest. Tiny right base pleural air collection noted. This is most likely related to fluid evacuation and follow-up exam is suggested. No prominent pneumothorax noted. Bibasilar atelectasis/infiltrates. Small left pleural effusion. Prior CABG. Stable cardiomegaly. IMPRESSION: 1.  Endotracheal tube tip approximately 1.9 cm above the carina. 2. Right chest tube noted over the medial right chest with evacuation of the previously identified right pleural effusion. Tiny collection of right base pleural air is  noted, this is most likely related to fluid evacuation and a follow-up exam is suggested. No prominent pneumothorax noted. 3.  Bibasilar atelectasis/infiltrates.  Small left pleural effusion. These results will be called to the ordering clinician or representative by the Radiologist Assistant, and communication documented in the PACS or zVision Dashboard. Electronically Signed   By: Marcello Moores  Register   On: 05/20/2019 10:05   Dg Chest Port 1 View  Result Date: 05/16/2019 CLINICAL DATA:  Status post RIGHT-sided thoracentesis. Chest pain during inspiration. EXAM: PORTABLE CHEST 1 VIEW COMPARISON:  Chest x-ray dated 05/14/2019. Chest CT dated 05/14/2019. FINDINGS: Slightly decreased opacity at the RIGHT lung base, compatible with residual pleural effusion and/or airspace collapse. LEFT lung remains clear. No pneumothorax seen. Heart size and mediastinal contours are stable. IMPRESSION: 1. Status post RIGHT-sided thoracentesis, per provided clinical data. Slightly improved aeration at the RIGHT lung base. Residual opacity at the RIGHT lung base likely represents smaller pleural effusion and/or airspace collapse. 2. No pneumothorax seen. Electronically Signed   By: Franki Cabot M.D.   On: 05/16/2019 12:55   Dg Chest Port 1 View  Result Date: 05/14/2019 CLINICAL DATA:  Shortness of breath, cough EXAM: PORTABLE CHEST 1 VIEW COMPARISON:  06/20/2014, 06/17/2018, 04/09/2017 FINDINGS: There is a small right pleural effusion. There is no left pleural effusion. There is mild right basilar atelectasis. There is no other focal parenchymal opacity. The heart mediastinum are stable.  There is evidence of prior CABG. The osseous structures are unremarkable. IMPRESSION: Small right pleural effusion. Electronically Signed   By: Kathreen Devoid   On: 05/14/2019 19:08   Vas [place] Lower Extremity Venous (dvt)  Result Date: 05/20/2019  Lower Venous Study Indications: Swelling, and Pre-op.  Performing Technologist: Maudry Mayhew  MHA, RDMS, RVT, RDCS  Examination Guidelines: A complete evaluation includes B-mode imaging, spectral Doppler, color Doppler, and power Doppler as needed of all accessible portions of each vessel. Bilateral testing is considered an integral part of a complete examination. Limited examinations for reoccurring indications may be performed as noted.  +---------+---------------+---------+-----------+----------+--------------+ RIGHT    CompressibilityPhasicitySpontaneityPropertiesSummary        +---------+---------------+---------+-----------+----------+--------------+ CFV      Full           Yes      Yes                                 +---------+---------------+---------+-----------+----------+--------------+ SFJ      Full                                                        +---------+---------------+---------+-----------+----------+--------------+ FV Prox  Full                                                        +---------+---------------+---------+-----------+----------+--------------+ FV Mid   Full                                                        +---------+---------------+---------+-----------+----------+--------------+ FV DistalFull                                                        +---------+---------------+---------+-----------+----------+--------------+ PFV      Full                                                        +---------+---------------+---------+-----------+----------+--------------+ POP      Full           Yes      Yes                                 +---------+---------------+---------+-----------+----------+--------------+ PTV      Full                                                        +---------+---------------+---------+-----------+----------+--------------+  PERO                                                  Not visualized +---------+---------------+---------+-----------+----------+--------------+    +---------+---------------+---------+-----------+----------+-------+ LEFT     CompressibilityPhasicitySpontaneityPropertiesSummary +---------+---------------+---------+-----------+----------+-------+ CFV      Full           Yes      Yes                          +---------+---------------+---------+-----------+----------+-------+ SFJ      Full                                                 +---------+---------------+---------+-----------+----------+-------+ FV Prox  Full                                                 +---------+---------------+---------+-----------+----------+-------+ FV Mid   Full                                                 +---------+---------------+---------+-----------+----------+-------+ FV DistalFull                                                 +---------+---------------+---------+-----------+----------+-------+ PFV      Full                                                 +---------+---------------+---------+-----------+----------+-------+ POP      Full           Yes      Yes                          +---------+---------------+---------+-----------+----------+-------+ PTV      Full                                                 +---------+---------------+---------+-----------+----------+-------+ PERO     Full                                                 +---------+---------------+---------+-----------+----------+-------+   Summary: Right: There is no evidence of deep vein thrombosis in the lower extremity. However, portions of this examination were limited- see technologist comments above. No cystic structure found in the popliteal fossa. Left: There is no evidence of deep vein thrombosis in the lower extremity. No cystic structure found in the popliteal  fossa.  *See table(s) above for measurements and observations. Electronically signed by Monica Martinez MD on 05/20/2019 at 5:22:14 PM.    Final      Microbiology: Recent Results (from the past 240 hour(s))  Surgical pcr screen     Status: None   Collection Time: 05/18/19  5:38 PM  Result Value Ref Range Status   MRSA, PCR NEGATIVE NEGATIVE Final   Staphylococcus aureus NEGATIVE NEGATIVE Final    Comment: (NOTE) The Xpert SA Assay (FDA approved for NASAL specimens in patients 69 years of age and older), is one component of a comprehensive surveillance program. It is not intended to diagnose infection nor to guide or monitor treatment. Performed at [facility], [place] [address], [place], [place] [address]   Expectorated sputum assessment w rflx to resp cult     Status: None   Collection Time: 05/18/19  7:25 PM  Result Value Ref Range Status   Specimen Description EXPECTORATED SPUTUM  Final   Special Requests Normal  Final   Sputum evaluation   Final    THIS SPECIMEN IS ACCEPTABLE FOR SPUTUM CULTURE Performed at [facility], [place] [address], [place], [place] [address]    Report Status 05/18/2019 FINAL  Final  Culture, respiratory     Status: None   Collection Time: 05/18/19  7:25 PM  Result Value Ref Range Status   Specimen Description EXPECTORATED SPUTUM  Final   Special Requests Normal Reflexed from [id number]  Final   Gram Stain   Final    RARE WBC PRESENT,BOTH PMN AND MONONUCLEAR FEW GRAM POSITIVE COCCI RARE GRAM NEGATIVE RODS    Culture   Final    FEW Consistent with normal respiratory flora. Performed at [facility], [place] [address] [place], [place] [address]    Report Status 05/21/2019 FINAL  Final  Anaerobic culture     Status: None   Collection Time: 05/20/19  8:35 AM  Result Value Ref Range Status   Specimen Description PLEURAL  Final   Special Requests RIGHT  Final   Culture   Final    NO ANAEROBES ISOLATED Performed at [facility], [place] [address] [place], [place] [address]    Report Status 05/25/2019 FINAL  Final  Fungus Culture With Stain     Status: None (Preliminary result)    Collection Time: 05/20/19  8:35 AM  Result Value Ref Range Status   Fungus Stain Final report  Final    Comment: (NOTE) Performed At: [facility] [place], [place] [id number] Rush Farmer MD [id number]    Fungus (Mycology) Culture PENDING  Incomplete   Fungal Source PLEURAL  Final    Comment: Performed at [facility], [place] [address], [place], [place] [address]  Acid Fast Smear (AFB)     Status: None   Collection Time: 05/20/19  8:35 AM  Result Value Ref Range Status   AFB Specimen Processing Concentration  Final   Acid Fast Smear Negative  Final    Comment: (NOTE) Performed At: [facility] [place], [place] [id number] Rush Farmer MD [id number]    Source (AFB) PLEURAL  Final    Comment: Performed at [facility], [place] [address] [place],  [address]  Fungus Culture Result     Status: None   Collection Time: 05/20/19  8:35 AM  Result Value Ref Range Status   Result 1 Comment  Final    Comment: (NOTE)  KOH/Calcofluor preparation:  no fungus observed. Performed At: [facility] [place], [place] [id number] Rush Farmer MD [id number]   Aerobic Culture (superficial specimen)     Status: None   Collection Time: 05/20/19  8:39 AM  Result Value Ref Range Status   Specimen Description PLEURAL  Final   Special Requests RIGHT  Final   Gram Stain   Final    FEW WBC PRESENT, PREDOMINANTLY PMN NO ORGANISMS SEEN    Culture   Final    NO GROWTH 2 DAYS Performed at [facility], [address] [place], [place] [address]    Report Status 05/22/2019 FINAL  Final  Culture, blood (routine x 2)     Status: None (Preliminary result)   Collection Time: 05/24/19 11:26 AM  Result Value Ref Range Status   Specimen Description BLOOD LEFT HAND  Final   Special Requests   Final    BOTTLES DRAWN AEROBIC ONLY Blood Culture adequate volume   Culture   Final    NO GROWTH 4  DAYS Performed at [facility], [place] [address] [place], [place] [address]    Report Status PENDING  Incomplete  Culture, blood (routine x 2)     Status: None (Preliminary result)   Collection Time: 05/24/19 11:27 AM  Result Value Ref Range Status   Specimen Description BLOOD LEFT HAND  Final   Special Requests   Final    BOTTLES DRAWN AEROBIC AND ANAEROBIC Blood Culture results may not be optimal due to an excessive volume of blood received in culture bottles   Culture   Final    NO GROWTH 4 DAYS Performed at [facility], [place] [address], [place],  [address]    Report Status PENDING  Incomplete  Culture, Urine     Status: Abnormal   Collection Time: 05/25/19  5:13 PM  Result Value Ref Range Status   Specimen Description URINE, RANDOM  Final   Special Requests   Final    NONE Performed at [facility], [place] [address] [place], [place] [address]    Culture >=100,000 COLONIES/mL PSEUDOMONAS AERUGINOSA (A)  Final   Report Status 05/27/2019 FINAL  Final   Organism ID, Bacteria PSEUDOMONAS AERUGINOSA (A)  Final      Susceptibility   Pseudomonas aeruginosa - MIC*    CEFTAZIDIME <=1 SENSITIVE Sensitive     CIPROFLOXACIN 1 SENSITIVE Sensitive     GENTAMICIN <=1 SENSITIVE Sensitive     IMIPENEM 8 INTERMEDIATE Intermediate     PIP/TAZO <=4 SENSITIVE Sensitive     CEFEPIME <=1 SENSITIVE Sensitive     * >=100,000 COLONIES/mL PSEUDOMONAS AERUGINOSA  Novel Coronavirus, NAA (hospital order; send-out to ref lab)     Status: None   Collection Time: 05/25/19 10:21 PM  Result Value Ref Range Status   SARS-CoV-2, NAA NOT DETECTED NOT DETECTED Final    Comment: (NOTE) This test was developed and its performance characteristics determined by [employer]. This test has not been FDA cleared or approved. This test has been authorized by FDA under an Emergency Use Authorization (EUA). This test is only authorized for the duration of time the declaration that  circumstances exist justifying the authorization of the emergency use of in vitro diagnostic tests for detection of SARS-CoV-2 virus and/or diagnosis of COVID-19 infection under section 564(b)(1) of the Act, 21 U.S.C. 597CBU-3(A)(4), unless the authorization is terminated or revoked sooner. When diagnostic testing is negative, the possibility of a  false negative result should be considered in the context of a patient's recent exposures and the presence of clinical signs and symptoms consistent with COVID-19. An individual without symptoms of COVID-19 and who is not shedding SARS-CoV-2 virus would expect to have a negative (not detected) result in this assay. Performed  At: [facility] [address] [place], [place] [id number] Rush Farmer MD VQ:[id number]    Hannah  Final    Comment: Performed at [facility], [place] [address] [place], [place] [address]     Labs: Basic Metabolic Panel: Recent Labs  Lab 05/23/19 [phone]  05/24/19 0533 05/25/19 0052 05/26/19 8099 05/27/19 0349 05/28/19 0238  NA 138   < > 138 136 137 139 137  K 3.7   < > 4.5 3.9 3.6 4.0 3.7  CL 105  --  102 100 100 103 101  CO2 25  --  26 26 26 26 25   GLUCOSE 107*  --  111* 116* 118* 101* 112*  BUN 11  --  16 19 13 11 10   CREATININE 1.27*  --  1.63* 1.26* 1.25* 1.24 1.15  CALCIUM 8.9  --  8.8* 8.9 9.1 9.1 9.1  MG 1.8  --  1.7 1.9 1.9 1.8  --   PHOS 2.3*  --   --  3.4  --   --   --    < > = values in this interval not displayed.   Liver Function Tests: Recent Labs  Lab 05/22/19 0230 05/23/19 0338 05/25/19 0052 05/26/19 0635 05/27/19 0349 05/28/19 0238  AST 16  --   --  31 25 21   ALT 6  --   --  14 11 8   ALKPHOS 38  --   --  83 96 109  BILITOT 1.0  --   --  0.8 0.8 0.6  PROT 5.6*  --   --  6.0* 5.8* 6.0*  ALBUMIN 2.6* 2.7* 2.8* 2.6* 2.6* 2.7*   No results for input(s): LIPASE, AMYLASE in the last 168 hours. No results for input(s): AMMONIA in the last 168  hours. CBC: Recent Labs  Lab 05/22/19 0230 05/23/19 0338  05/24/19 0533 05/25/19 0052 05/26/19 0635 05/27/19 0349 05/28/19 0238  WBC 14.3* 10.9*  --  25.8* 24.8* 16.2* 14.0* 12.8*  NEUTROABS 10.2* 7.0  --  20.0* 19.0*  --   --   --   HGB 12.9* 12.4*   < > 11.9* 11.9* 12.2* 11.9* 12.2*  HCT 39.9 37.7*   < > 35.8* 36.0* 36.4* 36.0* 36.9*  MCV 95.0 93.5  --  94.7 95.2 94.3 93.8 92.3  PLT 227 243  --  234 250 258 259 271   < > = values in this interval not displayed.   Cardiac Enzymes: Recent Labs  Lab 05/23/19 2226 05/24/19 0533 05/24/19 1321 05/24/19 1839 05/25/19 0052  TROPONINI 0.16* 1.07* 0.95* 0.68* 0.60*   BNP: BNP (last 3 results) Recent Labs    05/14/19 1810  BNP 77.0    ProBNP (last 3 results) No results for input(s): PROBNP in the last 8760 hours.  CBG: No results for input(s): GLUCAP in the last 168 hours.     Signed:  Fayrene Helper MD.  [facility] 05/28/2019, 5:24 PM

## 2019-05-28 NOTE — Progress Notes (Signed)
Physical Therapy Treatment Patient Details Name: Jeffrey [name] MRN: [id number] DOB: 12-Jan-1934 Today's Date: 05/28/2019    History of Present Illness Patient is a 85 y/o male presenting to the ED on 05/14/19 with primary complaints of dyspnea. Past medical history of CAD s/p CABG, ischemic CM,  mild Aortic stenosis, Asthma (mild), OSA, Hypothyroidism,  Parkinsons, Anxiety, remote hx of MRSE septic arthritis left knee s/p L knee ID/ synovectomy, L THA, Anemia/ ITP. Ct revealing Large, likely somewhat loculated, right-sided pleural effusion with near complete collapse of the right lower lobe. Admitted for R pleural effusion.     PT Comments    Patient ambulating unit with poor mechanics on RA, VSS. Pt to return home today per RN, feel this is appropriate from mobility standpoint given he lives with his wife for supervision. rec rollator and HHPT due to decreased activity tolerance.     Follow Up Recommendations  Home health PT     Equipment Recommendations  (Rollator)    Recommendations for Other Services       Precautions / Restrictions Precautions Precautions: Fall Restrictions Weight Bearing Restrictions: No RLE Weight Bearing: Non weight bearing    Mobility  Bed Mobility               General bed mobility comments: up in recliner  Transfers Overall transfer level: Needs assistance Equipment used: None Transfers: Sit to/from Stand Sit to Stand: Min guard         General transfer comment: for safety and initial standing balance  Ambulation/Gait Ambulation/Gait assistance: Min guard   Assistive device: IV Pole Gait Pattern/deviations: Step-through pattern;Decreased stride length;Shuffle;Trunk flexed Gait velocity: decreased   General Gait Details: flexed trunk throughout; mild instability -  patient reports this at baseline   Stairs             Wheelchair Mobility    Modified Rankin (Stroke Patients Only)       Balance Overall balance  assessment: Mild deficits observed, not formally tested                                          Cognition Arousal/Alertness: Awake/alert Behavior During Therapy: WFL for tasks assessed/performed Overall Cognitive Status: Within Functional Limits for tasks assessed                                        Exercises      General Comments        Pertinent Vitals/Pain      Home Living                      Prior Function            PT Goals (current goals can now be found in the care plan section) Acute Rehab PT Goals Patient Stated Goal: "I want to stay active" PT Goal Formulation: With patient Time For Goal Achievement: 06/01/19 Potential to Achieve Goals: Good    Frequency    Min 3X/week      PT Plan Current plan remains appropriate    Co-evaluation              AM-PAC PT "6 Clicks" Mobility   Outcome Measure  Help needed turning from your back to your side while in a flat  bed without using bedrails?: None Help needed moving from lying on your back to sitting on the side of a flat bed without using bedrails?: None Help needed moving to and from a bed to a chair (including a wheelchair)?: A Little Help needed standing up from a chair using your arms (e.g., wheelchair or bedside chair)?: A Little Help needed to walk in hospital room?: A Little Help needed climbing 3-5 steps with a railing? : A Little 6 Click Score: 20    End of Session Equipment Utilized During Treatment: Gait belt Activity Tolerance: Patient tolerated treatment well Patient left: in chair;with call bell/phone within reach Nurse Communication: Mobility status PT Visit Diagnosis: Unsteadiness on feet (R26.81);Other abnormalities of gait and mobility (R26.89);Muscle weakness (generalized) (M62.81)     Time: 1250-1320 PT Time Calculation (min) (ACUTE ONLY): 30 min  Charges:  $Gait Training: 8-22 mins $Therapeutic Activity: 8-22 mins                      Reinaldo Berber, PT, DPT Acute Rehabilitation Services Pager: [phone] Office: [phone]     Reinaldo Berber 05/28/2019, 2:03 PM

## 2019-05-28 NOTE — Progress Notes (Signed)
Pt d/c home with grandson and wife via private vehicle. Send pt home with rollator and a box of pleurX drains. RN drained pleurX prior to discharge ~ 100 cc. Discharge instructions given to pt and family. Answered all questions to pt satisfaction.

## 2019-05-28 NOTE — TOC Transition Note (Signed)
Transition of Care [facility]) - CM/SW Discharge Note   Patient Details  Name: Jeffrey [name] MRN: [id number] Date of Birth: 1934-01-20  Transition of Care [facility]) CM/SW Contact:  Bartholomew Crews, RN Phone Number: [phone] 05/28/2019, 4:54 PM   Clinical Narrative:    Spoke with patient at the bedside to discuss choice for [facility]. Patient states that he has used [facility] in the past - referral accepted by [facility] for RN, PT, Aide. Bedside RN to order a box of pleurX drains for patient to take home. [employer] contacted for rollator - will be delivered to the room prior to discharge. Brilinta free 30 day card provided - placed in chart. Discussed with patient. No further transition of care needs identified.    Final next level of care: [place] Barriers to Discharge: No Barriers Identified   Patient Goals and CMS Choice Patient states their goals for this hospitalization and ongoing recovery are:: to get better CMS Medicare.gov Compare Post Acute Care list provided to:: Patient Choice offered to / list presented to : Patient  Discharge Placement                       Discharge Plan and Services In-house Referral: NA Discharge Planning Services: CM Consult Post Acute Care Choice: NA          DME Arranged: Walker rolling with seat DME Agency: [place] Date DME Agency Contacted: 05/28/19 Time DME Agency Contacted: [phone] Representative spoke with at DME Agency: Magda Paganini [place]: PT, RN, Nurse's Aide [place] Agency: [place] ([place]) Date [place]: 05/28/19 Time [place]: [phone] Representative spoke with at [place]: [place] ([place]) Interventions     Readmission Risk Interventions No flowsheet data found.

## 2019-05-29 LAB — CULTURE, BLOOD (ROUTINE X 2)
Culture: NO GROWTH
Culture: NO GROWTH
Special Requests: ADEQUATE

## 2019-05-30 ENCOUNTER — Other Ambulatory Visit: Payer: Self-pay

## 2019-05-30 DIAGNOSIS — R0602 Shortness of breath: Secondary | ICD-10-CM

## 2019-05-31 ENCOUNTER — Other Ambulatory Visit: Payer: Self-pay

## 2019-05-31 ENCOUNTER — Ambulatory Visit: Payer: Medicare Other | Admitting: Neurology

## 2019-05-31 ENCOUNTER — Ambulatory Visit
Admission: RE | Admit: 2019-05-31 | Discharge: 2019-05-31 | Disposition: A | Discharge status: Home / Self Care | Payer: Medicare Other | Source: Ambulatory Visit | Attending: Cardiothoracic Surgery | Admitting: Cardiothoracic Surgery

## 2019-05-31 ENCOUNTER — Ambulatory Visit (INDEPENDENT_AMBULATORY_CARE_PROVIDER_SITE_OTHER): Payer: Self-pay | Admitting: Physician Assistant

## 2019-05-31 VITALS — BP 140/59 | HR 85 | Temp 97.5°F | Resp 20 | Ht 73.0 in | Wt 210.0 lb

## 2019-05-31 DIAGNOSIS — J90 Pleural effusion, not elsewhere classified: Secondary | ICD-10-CM

## 2019-05-31 DIAGNOSIS — Z9689 Presence of other specified functional implants: Secondary | ICD-10-CM

## 2019-05-31 DIAGNOSIS — R0602 Shortness of breath: Secondary | ICD-10-CM

## 2019-05-31 NOTE — Progress Notes (Addendum)
HPI:  Patient returns for routine postoperative follow-up having undergone a right VATS, right pleural biopsies, TALC, and right Pleur X cathter by [name] on 05/20/2019. The patient's early postoperative recovery while in the hospital was notable for a NSTEMI, which he underwent PCI to proximal left Circumflex with DES stent on 05/26/2019. He was also treated for a UTI (Pseudomonas Aeruginosa). Since hospital discharge the patient reports shortness of breath (not as bad prior to last hospital admission) and weakness. He states he has anxiety, which makes things worse. HH draining right Pleur X catheter every Monday, Wednesday, Friday.   Current Outpatient Medications  Medication Sig Dispense Refill  . ALPRAZolam (XANAX) 0.25 MG tablet Take 1 tablet (0.25 mg total) by mouth 2 (two) times daily as needed for anxiety. 20 tablet 0  . aspirin EC 81 MG tablet Take 1 tablet (81 mg total) by mouth daily. 30 tablet 2  . carbidopa-levodopa (SINEMET IR) 25-100 MG tablet Take 1 tablet by mouth 3 (three) times daily. 270 tablet 1  . ciprofloxacin (CIPRO) 250 MG tablet Take 1 tablet (250 mg total) by mouth 2 (two) times daily for 4 days. 8 tablet 0  . Fluticasone-Salmeterol (ADVAIR) 100-50 MCG/DOSE AEPB Inhale 1 puff into the lungs daily.    . folic acid (FOLVITE) 1 MG tablet Take 1 mg by mouth daily.    [name] Kitchen levothyroxine (SYNTHROID, LEVOTHROID) 200 MCG tablet Take 200 mcg by mouth at bedtime.     . magnesium oxide (MAG-OX) 400 (241.3 Mg) MG tablet Take 1 tablet (400 mg total) by mouth 2 (two) times daily for 30 days. 60 tablet 0  . methotrexate (RHEUMATREX) 2.5 MG tablet Take 15 mg by mouth once a week. Caution:Chemotherapy. Protect from light.    . Omega-3 Fatty Acids (FISH OIL) 1200 MG CAPS Take 1,200 mg by mouth at bedtime. 360 MG OMEGA-3    . omeprazole (PRILOSEC) 40 MG capsule Take 40 mg by mouth at bedtime.     . predniSONE (DELTASONE) 10 MG tablet Take 10 mg by mouth daily with breakfast.    .  rOPINIRole (REQUIP) 0.5 MG tablet Take 0.5 mg by mouth at bedtime.    . ticagrelor (BRILINTA) 90 MG TABS tablet Take 1 tablet (90 mg total) by mouth 2 (two) times daily. 60 tablet 2  . Vitamin D, Ergocalciferol, (DRISDOL) 1.25 MG (50000 UT) CAPS capsule Take 50,000 Units by mouth every 7 (seven) days.    Vital Signs: BP 140/59, HR 85, RR 20, Oxygenation 95% on room air   Physical Exam CV-RRR, murmur Pulmonary-Clear on the left and slightly diminished on the right Extremities-No LE edema Wound-Clean and dry. Silk suture removed  Diagnostic Tests: CLINICAL DATA:  Shortness of breath.  Recent right VATS.  EXAM: CHEST - 2 VIEW  COMPARISON:  Chest x-ray dated May 28, 2019.  FINDINGS: Stable cardiomegaly status post CABG. Normal mediastinal contours. Normal pulmonary vascularity. Right greater than left basilar atelectasis/scarring, similar to prior study. Unchanged small right pleural effusion. No pneumothorax. No acute osseous abnormality.  IMPRESSION: 1. Unchanged small right pleural effusion and bibasilar atelectasis/scarring.   Electronically Signed   By: Titus Dubin M.D.   On: 05/31/2019 13:40  Impression and Plan: Patient's chest x ray is stable. He is emotional on this visit and states his anxiety sometimes makes his breathing worse. He has a history of COPD and is on Breo. He does have an appointment to see cardiology next week and pulmonology in 2 weeks. I instructed  him he is not hypoxic and right Pleur X cathter is draining. If his breathing worsens, he should go to ER. He already has a follow up appointment to see [name] on 06/26.    Nani Skillern, PA-C [facility] [phone]

## 2019-06-01 ENCOUNTER — Telehealth: Payer: Self-pay | Admitting: Primary Care

## 2019-06-01 ENCOUNTER — Encounter: Payer: Self-pay | Admitting: Primary Care

## 2019-06-01 ENCOUNTER — Ambulatory Visit (INDEPENDENT_AMBULATORY_CARE_PROVIDER_SITE_OTHER): Payer: Medicare Other | Admitting: Primary Care

## 2019-06-01 DIAGNOSIS — J90 Pleural effusion, not elsewhere classified: Secondary | ICD-10-CM

## 2019-06-01 DIAGNOSIS — I214 Non-ST elevation (NSTEMI) myocardial infarction: Secondary | ICD-10-CM | POA: Diagnosis not present

## 2019-06-01 DIAGNOSIS — R0602 Shortness of breath: Secondary | ICD-10-CM | POA: Diagnosis not present

## 2019-06-01 MED ORDER — TIOTROPIUM BROMIDE MONOHYDRATE 1.25 MCG/ACT IN AERS: 2.0000 | INHALATION_SPRAY | Freq: Every day | RESPIRATORY_TRACT | 0 refills | Status: DC

## 2019-06-01 MED ORDER — ALBUTEROL SULFATE HFA 108 (90 BASE) MCG/ACT IN AERS: 2.0000 | INHALATION_SPRAY | Freq: Four times a day (QID) | RESPIRATORY_TRACT | 6 refills | Status: DC | PRN

## 2019-06-01 MED ORDER — AEROCHAMBER MV MISC: 0 refills | Status: DC

## 2019-06-01 MED ORDER — TIOTROPIUM BROMIDE MONOHYDRATE 1.25 MCG/ACT IN AERS: 2.0000 | INHALATION_SPRAY | Freq: Every day | RESPIRATORY_TRACT | 3 refills | Status: DC

## 2019-06-01 NOTE — Telephone Encounter (Signed)
Called and spoke with Varney Biles from [facility]. She stated that she would be able to fax over the cytology report but not the surgical pathology. It appears that Dr. Nils Pyle requested the pathology to be sent to a different office for a 2nd opinion. She stated that the specimen was sent to the other office on Monday or Tuesday of this week.   Spoke with [place], she verbalized understanding. Nothing further needed at time of call.

## 2019-06-01 NOTE — Telephone Encounter (Signed)
I am fine with him coming in for a visit, but if he would prefer we can do a video visit

## 2019-06-01 NOTE — Progress Notes (Signed)
@Patient  ID: Jeffrey [name], male    DOB: May 22, 1934, 85 y.o.   MRN: [id number]  Chief Complaint  Patient presents with  . Follow-up    SOB all the time, cough with clear     Referring provider: [name], MD  HPI: 85 year old male, former smoker quit 12-Mar-1967 (15-pack-year history). 10 year history of asbestos exposure from working in [employer] 40 years ago. Past medical history significant for COPD/asthma, OSA, Parkinson's disease, hypothyroidism, MRSE septic arthritis left knee, left total hip arthroplasty, anemia/ITP, coronary artery disease s/p CABG, mild aortic stenosis, anxiety.   Recent hospitalization from 5/23-6/6 for shortness of breath and right pleural effusion. Consulted inpatient by Dr. Nelda Marseille and Dr. Elsworth Soho with pulmonary. CT chest 5/23 showed loculated right pleural effusion and pleural plaques.  Status post right thoracentesis on 5/25. Fluid culture showed lymphocytic exudate with low glucose, felt to be unlikely rheumatoid effusion. Cytology negative with a few mesothelial cells. Uncertain for mesothelioma related to asbestos exposure.  Since TCTS consulted and patient had right VATS with pleural biopsy, TALC and right Pleurx catheter by [name] on 5/29.  Course complicated by an NSTEMI, status post catheterization per cardiology 6/4 with PCI to proximal LCx with DES.  Recommending aspirin and Brilinta x3 months and then Brilinta x9 months. Plan drainage pleurx MWF. Surgical pathology is still pending- contacted lab and was told pathology was sent for second opinion.  06/01/2019 Patient presents today for hospital follow-up visit. Accompanied by his wife.  Patient is doing okay considering.  He continues to have moderate shortness of breath. He has been using Advair diskus twice daily. Patient does not have any rescue inhaler. No PFTs on file, he is a former smoker. He had a chest x-ray yesterday with TCTS that showed unchanged small right pleural effusion and bibasilar  atelectasis/scarring. Right Pleurx catheter is being drained three times a week. Wife states that it has been draining on average 150cc each time. He does not have much appetite.  He has tried protein shakes but does not like how thick they are. His mood is somewhat depressed/anxious.   Allergies  Allergen Reactions  . Lipitor [Atorvastatin] Other (See Comments)    muscle spasms cramps  . Methylprednisolone Other (See Comments)    cramps cramps  . Other     If patient is to receive blood - blood must be treated witgh radiation because his body will not accept a normal infusion   . Sulfa Antibiotics Itching  . Doxycycline Rash    Doxycycline caused itching redness on scalp and head Doxycycline caused itching redness on scalp and head    Immunization History  Administered Date(s) Administered  . Influenza,inj,Quad PF,6+ Mos 09/04/2014  . Pneumococcal Polysaccharide-23 09/05/2011  . Tdap 09/16/2012    Past Medical History:  Diagnosis Date  . Anxiety   . Aortic stenosis    mild AS by 07/20/13 echo ([facility])  . Arthritis    "all over"  . Coronary artery disease    NSTEMI 06/2010, CABG x3=> [place]->LAD, SVG->OM1, SVG->PDA, DES LCx 10/2011, DES LAD 12/2011  . Depression   . GERD (gastroesophageal reflux disease)   . H/O hiatal hernia   . Hearing aid worn    B/L  . High cholesterol   . History of blood transfusion reaction 2013/03/11   "he almost died; he has to get irradiated blood next time"  . Hypertension   . Hypothyroidism   . Ischemic cardiomyopathy   . ITP (  idiopathic thrombocytopenic purpura)    Dr. Gaynelle Arabian, on [place]  . Myocardial infarction ([place]) 2011  . Pneumonia 1940's X 1; 2015 X 1  . Rheumatoid arthritis ([place])   . Shortness of breath    with exertion, has not been very active  . Sleep apnea   . Wears glasses   . Wears partial dentures     Tobacco History: Social History   Tobacco Use  Smoking Status Former Smoker  . Packs/day: 1.00  .  Years: 15.00  . Pack years: 15.00  . Types: Cigarettes  . Last attempt to quit: 12/22/1966  . Years since quitting: 52.4  Smokeless Tobacco Former Systems developer  . Types: Chew  Tobacco Comment   "quit smoking ~ late ~ 60's; quit chewing in the 1970's"   Counseling given: Not Answered Comment: "quit smoking ~ late ~ 60's; quit chewing in the 1970's"   Outpatient Medications Prior to Visit  Medication Sig Dispense Refill  . ALPRAZolam (XANAX) 0.25 MG tablet Take 1 tablet (0.25 mg total) by mouth 2 (two) times daily as needed for anxiety. 20 tablet 0  . aspirin EC 81 MG tablet Take 1 tablet (81 mg total) by mouth daily. 30 tablet 2  . carbidopa-levodopa (SINEMET IR) 25-100 MG tablet Take 1 tablet by mouth 3 (three) times daily. 270 tablet 1  . ciprofloxacin (CIPRO) 250 MG tablet Take 1 tablet (250 mg total) by mouth 2 (two) times daily for 4 days. 8 tablet 0  . Fluticasone-Salmeterol (ADVAIR) 100-50 MCG/DOSE AEPB Inhale 1 puff into the lungs daily.    . folic acid (FOLVITE) 1 MG tablet Take 1 mg by mouth daily.    [name] Kitchen levothyroxine (SYNTHROID, LEVOTHROID) 200 MCG tablet Take 200 mcg by mouth at bedtime.     . magnesium oxide (MAG-OX) 400 (241.3 Mg) MG tablet Take 1 tablet (400 mg total) by mouth 2 (two) times daily for 30 days. 60 tablet 0  . methotrexate (RHEUMATREX) 2.5 MG tablet Take 15 mg by mouth once a week. Caution:Chemotherapy. Protect from light.    . Omega-3 Fatty Acids (FISH OIL) 1200 MG CAPS Take 1,200 mg by mouth at bedtime. 360 MG OMEGA-3    . omeprazole (PRILOSEC) 40 MG capsule Take 40 mg by mouth at bedtime.     . predniSONE (DELTASONE) 10 MG tablet Take 10 mg by mouth daily with breakfast.    . rOPINIRole (REQUIP) 0.5 MG tablet Take 0.5 mg by mouth at bedtime.    . ticagrelor (BRILINTA) 90 MG TABS tablet Take 1 tablet (90 mg total) by mouth 2 (two) times daily. 60 tablet 2  . Vitamin D, Ergocalciferol, (DRISDOL) 1.25 MG (50000 UT) CAPS capsule Take 50,000 Units by mouth every 7 (seven)  days.     No facility-administered medications prior to visit.    Review of Systems  Review of Systems  Constitutional: Positive for appetite change, fatigue and unexpected weight change.  Respiratory: Positive for cough and shortness of breath. Negative for wheezing.   Cardiovascular: Negative.    Physical Exam  BP 100/68 (BP Location: Left Arm, Cuff Size: Normal)   Pulse 82   Temp 97.6 F (36.4 C)   Ht 6\' 1"  (1.854 m)   Wt 205 lb (93 kg)   SpO2 96%   BMI 27.05 kg/m  Physical Exam Constitutional:      General: He is not in acute distress.    Appearance: Normal appearance. He is ill-appearing.  HENT:     Right Ear: Tympanic  membrane normal.     Left Ear: Tympanic membrane normal.     Nose: Nose normal.     Mouth/Throat:     Mouth: Mucous membranes are moist.     Pharynx: Oropharynx is clear.  Neck:     Musculoskeletal: Normal range of motion and neck supple.  Cardiovascular:     Rate and Rhythm: Normal rate.  Pulmonary:     Breath sounds: No wheezing or rhonchi.     Comments: Clear but diminished, poor insp effort Musculoskeletal: Normal range of motion.  Neurological:     General: No focal deficit present.     Mental Status: He is alert and oriented to person, place, and time. Mental status is at baseline.  Psychiatric:        Thought Content: Thought content normal.        Judgment: Judgment normal.     Comments: Flat affect      Lab Results:  CBC    Component Value Date/Time   WBC 12.8 (H) 05/28/2019 0238   RBC 4.00 (L) 05/28/2019 0238   HGB 12.2 (L) 05/28/2019 0238   HCT 36.9 (L) 05/28/2019 0238   PLT 271 05/28/2019 0238   MCV 92.3 05/28/2019 0238   MCH 30.5 05/28/2019 0238   MCHC 33.1 05/28/2019 0238   RDW 15.2 05/28/2019 0238   LYMPHSABS 2.7 05/25/2019 0052   MONOABS 2.6 (H) 05/25/2019 0052   EOSABS 0.3 05/25/2019 0052   BASOSABS 0.1 05/25/2019 0052    BMET    Component Value Date/Time   NA 137 05/28/2019 0238   K 3.7 05/28/2019 0238    CL 101 05/28/2019 0238   CO2 25 05/28/2019 0238   GLUCOSE 112 (H) 05/28/2019 0238   BUN 10 05/28/2019 0238   CREATININE 1.15 05/28/2019 0238   CREATININE 1.79 (H) 10/12/2014 1136   CALCIUM 9.1 05/28/2019 0238   GFRNONAA 59 (L) 05/28/2019 0238   GFRNONAA 36 (L) 10/12/2014 1136   GFRAA >60 05/28/2019 0238   GFRAA 41 (L) 10/12/2014 1136    BNP    Component Value Date/Time   BNP 77.0 05/14/2019 1810    ProBNP No results found for: PROBNP  Imaging: Dg Chest 2 View  Result Date: 05/31/2019 CLINICAL DATA:  Shortness of breath.  Recent right VATS. EXAM: CHEST - 2 VIEW COMPARISON:  Chest x-ray dated May 28, 2019. FINDINGS: Stable cardiomegaly status post CABG. Normal mediastinal contours. Normal pulmonary vascularity. Right greater than left basilar atelectasis/scarring, similar to prior study. Unchanged small right pleural effusion. No pneumothorax. No acute osseous abnormality. IMPRESSION: 1. Unchanged small right pleural effusion and bibasilar atelectasis/scarring. Electronically Signed   By: Titus Dubin M.D.   On: 05/31/2019 13:40   Dg Chest 2 View  Result Date: 05/28/2019 CLINICAL DATA:  Chest tube in place. EXAM: CHEST - 2 VIEW COMPARISON:  Chest radiograph 05/26/2019 FINDINGS: Monitoring leads overlie the patient. Stable cardiomegaly. Tortuosity of the thoracic aorta. Right-greater-than-left basilar heterogeneous opacities. Small bilateral pleural effusions. Osseous structures unremarkable. IMPRESSION: Stable bibasilar opacities favored represent atelectasis with small bilateral pleural effusions, right-greater-than-left. Electronically Signed   By: Lovey Newcomer M.D.   On: 05/28/2019 08:16   Ct Angio Chest Pe W Or Wo Contrast  Result Date: 05/23/2019 CLINICAL DATA:  Tachycardia.  Back pain. EXAM: CT ANGIOGRAPHY CHEST WITH CONTRAST TECHNIQUE: Multidetector CT imaging of the chest was performed using the standard protocol during bolus administration of intravenous contrast. Multiplanar  CT image reconstructions and MIPs were obtained to evaluate the vascular  anatomy. CONTRAST:  69mL OMNIPAQUE IOHEXOL 350 MG/ML SOLN COMPARISON:  CT dated 05/14/2019 FINDINGS: Cardiovascular: There is no PE identified. There are coronary artery calcifications. Atherosclerotic changes are noted of the thoracic aorta. There is no large pericardial effusion. Aortic calcifications are noted. The thoracic aorta measures up to 4.2 cm. Mediastinum/Nodes: No enlarged mediastinal, hilar, or axillary lymph nodes. Thyroid gland, trachea, and esophagus demonstrate no significant findings. Lungs/Pleura: There is been significant interval decrease in size of the previously noted right-sided pleural effusion. There is a small residual loculated right-sided hydropneumothorax. There is atelectasis involving the right lung base. Calcified pleural based plaques are again noted. There is atelectasis at the left lung base with a few calcified pleural based plaques. A right-sided chest tube is in place. Upper Abdomen: No acute abnormality. Musculoskeletal: No chest wall abnormality. No acute or significant osseous findings. Review of the MIP images confirms the above findings. IMPRESSION: 1. No PE. 2. Significant interval decrease in size of the previously demonstrated right-sided pleural effusion. A right-sided chest tube is in place with a small residual right-sided hydropneumothorax. 3. Again identified are calcified pleural based plaques bilaterally. 4. Additional chronic findings as previously described. Aortic Atherosclerosis (ICD10-I70.0). Electronically Signed   By: Constance Holster M.D.   On: 05/23/2019 21:31   Ct Angio Chest Pe W/cm &/or Wo Cm  Result Date: 05/14/2019 CLINICAL DATA:  Shortness of breath EXAM: CT ANGIOGRAPHY CHEST WITH CONTRAST TECHNIQUE: Multidetector CT imaging of the chest was performed using the standard protocol during bolus administration of intravenous contrast. Multiplanar CT image reconstructions  and MIPs were obtained to evaluate the vascular anatomy. CONTRAST:  14mL OMNIPAQUE IOHEXOL 350 MG/ML SOLN COMPARISON:  Correlation made with chest x-ray from the same day FINDINGS: Cardiovascular: The patient is status post prior CABG. Aortic calcifications are noted. There is no pulmonary embolus identified. The ascending aorta is ectatic measuring up to approximately 4.2 cm in diameter. Dense coronary artery calcifications are noted. Mediastinum/Nodes: No enlarged mediastinal, hilar, or axillary lymph nodes. Thyroid gland, trachea, and esophagus demonstrate no significant findings. There are no pathologically enlarged axillary or supraclavicular lymph nodes. Lungs/Pleura: There is a large right-sided pleural effusion that is likely to some degree loculated. Multiple calcified pleural based plaques are noted primarily at the right lung base. There is some mild areas of pleural thickening superiorly. There is near complete collapse of the right lower lobe. The trachea is unremarkable. There is partial collapse of the right middle lobe. Upper Abdomen: There is cholelithiasis without CT evidence of acute cholecystitis. There is a 2 point 1 cm cystic appearing lesion near the pancreatic body (axial series 5, image 329). The visualized stomach is unremarkable. There may be a small nonobstructing stone versus vascular calcification in the interpolar region of the left kidney. Musculoskeletal: No chest wall abnormality. No acute or significant osseous findings. Review of the MIP images confirms the above findings. IMPRESSION: 1. No PE. 2. Large, likely somewhat loculated, right-sided pleural effusion with near complete collapse of the right lower lobe. 3. Multiple calcified pleural based plaques are noted on the right. There is some mild pleural thickening towards the right lung apex. Findings can be seen in patients with prior asbestos exposure. 4. Ectatic thoracic aorta measuring approximately 4.2 cm in diameter.  Recommend annual imaging followup by CTA or MRA. This recommendation follows 2010 ACCF/AHA/AATS/ACR/ASA/SCA/SCAI/SIR/STS/SVM Guidelines for the Diagnosis and Management of Patients with Thoracic Aortic Disease. Circulation. 2010; 121: N053-Z767. Aortic aneurysm NOS (ICD10-I71.9) 5. Cholelithiasis without CT evidence of acute  cholecystitis. 6. 2.1 cm cystic pancreatic lesion. Follow-up contrast enhanced MRI is recommended 1 year. This is favored to represent a benign etiology given its appearance. Aortic Atherosclerosis (ICD10-I70.0). Electronically Signed   By: Constance Holster M.D.   On: 05/14/2019 21:26   Dg Chest Port 1 View  Result Date: 05/26/2019 CLINICAL DATA:  Follow-up VATS EXAM: PORTABLE CHEST 1 VIEW COMPARISON:  05/25/2019 FINDINGS: Cardiac shadow is stable. Postsurgical changes are again noted. Small right pleural effusion and right basilar atelectasis are noted. PleurX catheter is noted on the right and stable. IMPRESSION: Stable right pleural effusion and basilar atelectasis. PleurX catheter is stable in appearance. Electronically Signed   By: Inez Catalina M.D.   On: 05/26/2019 07:54   Dg Chest Port 1 View  Result Date: 05/25/2019 CLINICAL DATA:  Recent VATS EXAM: PORTABLE CHEST 1 VIEW COMPARISON:  05/24/2019 FINDINGS: Cardiac shadow is stable. Postsurgical changes are again seen. Small right pleural effusion is again noted and stable. Mild right basilar atelectasis is noted. No bony abnormality is noted. IMPRESSION: Small right pleural effusion and right basilar atelectasis. Electronically Signed   By: Inez Catalina M.D.   On: 05/25/2019 08:49   Dg Chest Port 1 View  Result Date: 05/24/2019 CLINICAL DATA:  Status post chest tube placement EXAM: PORTABLE CHEST 1 VIEW COMPARISON:  05/23/2019 FINDINGS: Cardiac shadow is enlarged but stable. Postsurgical changes are noted. Right-sided pleural effusion is again seen with small PleurX catheter noted in place. No pneumothorax is seen. Left lung is  clear. IMPRESSION: Right-sided pleural effusion stable from the prior exam. No new focal abnormality is seen. Electronically Signed   By: Inez Catalina M.D.   On: 05/24/2019 07:17   Dg Chest Port 1 View  Result Date: 05/23/2019 CLINICAL DATA:  Chest tube removal. EXAM: PORTABLE CHEST 1 VIEW COMPARISON:  Radiograph of same day. FINDINGS: Stable cardiomediastinal silhouette. Right-sided chest tube is unchanged in position. No pneumothorax is noted. Left lung is clear. Stable right basilar atelectasis is noted with probable minimal pleural effusion. IMPRESSION: Stable position of right-sided chest tube is noted. No pneumothorax is noted. Stable right basilar atelectasis is noted. Electronically Signed   By: Marijo Conception M.D.   On: 05/23/2019 18:44   Dg Chest Port 1 View  Result Date: 05/23/2019 CLINICAL DATA:  85 year old male status post right side VATS with talc pleurodesis postoperative day 3. EXAM: PORTABLE CHEST 1 VIEW COMPARISON:  05/22/2019 and earlier. FINDINGS: Portable AP semi upright view at 0530 hours. Stable right chest tubes. No pneumothorax identified. Stable lung volumes and ventilation with patchy right lung base opacity. Stable cardiac size and mediastinal contours. Prior CABG. Paucity of bowel gas in the upper abdomen. No acute osseous abnormality identified. IMPRESSION: Stable chest tubes and postoperative appearance of the right chest. No pneumothorax . Electronically Signed   By: Genevie Ann M.D.   On: 05/23/2019 06:35   Dg Chest Port 1 View  Result Date: 05/22/2019 CLINICAL DATA:  Chest tube in place EXAM: PORTABLE CHEST 1 VIEW COMPARISON:  05/20/2019 FINDINGS: Interval extubation. Medial right chest tube.  No pneumothorax is seen. Mild right basilar opacity, likely a combination of mildly complex pleural effusion and atelectasis. Left lung is clear. The heart is normal in size. Postsurgical changes related to prior CABG. Median sternotomy. IMPRESSION: Interval extubation. Stable right  chest tube.  No pneumothorax is seen. Stable right basilar opacity, likely combination of atelectasis and complex pleural effusion. Electronically Signed   By: Henderson Newcomer.D.  On: 05/22/2019 06:46   Dg Chest Portable 1 View  Result Date: 05/20/2019 CLINICAL DATA:  VATS. EXAM: PORTABLE CHEST 1 VIEW COMPARISON:  05/16/2019. FINDINGS: Endotracheal tube tip approximately 1.9 cm above the carina. Right chest tube noted medial right chest. Tiny right base pleural air collection noted. This is most likely related to fluid evacuation and follow-up exam is suggested. No prominent pneumothorax noted. Bibasilar atelectasis/infiltrates. Small left pleural effusion. Prior CABG. Stable cardiomegaly. IMPRESSION: 1.  Endotracheal tube tip approximately 1.9 cm above the carina. 2. Right chest tube noted over the medial right chest with evacuation of the previously identified right pleural effusion. Tiny collection of right base pleural air is noted, this is most likely related to fluid evacuation and a follow-up exam is suggested. No prominent pneumothorax noted. 3.  Bibasilar atelectasis/infiltrates.  Small left pleural effusion. These results will be called to the ordering clinician or representative by the Radiologist Assistant, and communication documented in the PACS or zVision Dashboard. Electronically Signed   By: Marcello Moores  Register   On: 05/20/2019 10:05   Dg Chest Port 1 View  Result Date: 05/16/2019 CLINICAL DATA:  Status post RIGHT-sided thoracentesis. Chest pain during inspiration. EXAM: PORTABLE CHEST 1 VIEW COMPARISON:  Chest x-ray dated 05/14/2019. Chest CT dated 05/14/2019. FINDINGS: Slightly decreased opacity at the RIGHT lung base, compatible with residual pleural effusion and/or airspace collapse. LEFT lung remains clear. No pneumothorax seen. Heart size and mediastinal contours are stable. IMPRESSION: 1. Status post RIGHT-sided thoracentesis, per provided clinical data. Slightly improved aeration  at the RIGHT lung base. Residual opacity at the RIGHT lung base likely represents smaller pleural effusion and/or airspace collapse. 2. No pneumothorax seen. Electronically Signed   By: Franki Cabot M.D.   On: 05/16/2019 12:55   Dg Chest Port 1 View  Result Date: 05/14/2019 CLINICAL DATA:  Shortness of breath, cough EXAM: PORTABLE CHEST 1 VIEW COMPARISON:  06/20/2014, 06/17/2018, 04/09/2017 FINDINGS: There is a small right pleural effusion. There is no left pleural effusion. There is mild right basilar atelectasis. There is no other focal parenchymal opacity. The heart mediastinum are stable. There is evidence of prior CABG. The osseous structures are unremarkable. IMPRESSION: Small right pleural effusion. Electronically Signed   By: Kathreen Devoid   On: 05/14/2019 19:08   Vas [place] Lower Extremity Venous (dvt)  Result Date: 05/20/2019  Lower Venous Study Indications: Swelling, and Pre-op.  Performing Technologist: Maudry Mayhew MHA, RDMS, RVT, RDCS  Examination Guidelines: A complete evaluation includes B-mode imaging, spectral Doppler, color Doppler, and power Doppler as needed of all accessible portions of each vessel. Bilateral testing is considered an integral part of a complete examination. Limited examinations for reoccurring indications may be performed as noted.  +---------+---------------+---------+-----------+----------+--------------+ RIGHT    CompressibilityPhasicitySpontaneityPropertiesSummary        +---------+---------------+---------+-----------+----------+--------------+ CFV      Full           Yes      Yes                                 +---------+---------------+---------+-----------+----------+--------------+ SFJ      Full                                                        +---------+---------------+---------+-----------+----------+--------------+  FV Prox  Full                                                         +---------+---------------+---------+-----------+----------+--------------+ FV Mid   Full                                                        +---------+---------------+---------+-----------+----------+--------------+ FV DistalFull                                                        +---------+---------------+---------+-----------+----------+--------------+ PFV      Full                                                        +---------+---------------+---------+-----------+----------+--------------+ POP      Full           Yes      Yes                                 +---------+---------------+---------+-----------+----------+--------------+ PTV      Full                                                        +---------+---------------+---------+-----------+----------+--------------+ PERO                                                  Not visualized +---------+---------------+---------+-----------+----------+--------------+   +---------+---------------+---------+-----------+----------+-------+ LEFT     CompressibilityPhasicitySpontaneityPropertiesSummary +---------+---------------+---------+-----------+----------+-------+ CFV      Full           Yes      Yes                          +---------+---------------+---------+-----------+----------+-------+ SFJ      Full                                                 +---------+---------------+---------+-----------+----------+-------+ FV Prox  Full                                                 +---------+---------------+---------+-----------+----------+-------+ FV Mid   Full                                                 +---------+---------------+---------+-----------+----------+-------+  FV DistalFull                                                 +---------+---------------+---------+-----------+----------+-------+ PFV      Full                                                  +---------+---------------+---------+-----------+----------+-------+ POP      Full           Yes      Yes                          +---------+---------------+---------+-----------+----------+-------+ PTV      Full                                                 +---------+---------------+---------+-----------+----------+-------+ PERO     Full                                                 +---------+---------------+---------+-----------+----------+-------+   Summary: Right: There is no evidence of deep vein thrombosis in the lower extremity. However, portions of this examination were limited- see technologist comments above. No cystic structure found in the popliteal fossa. Left: There is no evidence of deep vein thrombosis in the lower extremity. No cystic structure found in the popliteal fossa.  *See table(s) above for measurements and observations. Electronically signed by Monica Martinez MD on 05/20/2019 at 5:22:14 PM.    Final      Assessment & Plan:   Pleural effusion on right - S/p right thoracentesis 5/25 with pulmonary (Fluid culture showed lymphocytic exudate with low glucose, felt to be unlikely rheumatoid effusion. Cytology negative with a few mesothelial cells) - S/p VATS with pleural biopsy, TALC and right Pleurx catheter by [name] on 5/29 - CXR 6/9 stable small right pleural effusion - Plan right pleurx catheter drainage MWF (150cc off today) - Surgical pathology pending second opinion - Follow up with Dr. Nils Pyle June 26th   Shortness of breath - Presumed COPD. Former smoker, no PFTs on file.  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 Elsworth Soho in 4 weeks or NP     Martyn Ehrich,  NP 06/01/2019

## 2019-06-01 NOTE — Telephone Encounter (Signed)
Can we please follow-up on RIGHT PLEURAL surgical pathology from 5/29 with Dr. Nils Pyle. Appears to still be pending

## 2019-06-01 NOTE — Telephone Encounter (Signed)
Called and spoke with pt's grandson Jeffrey [name] to get more info from him in regards to pt's SOB. Per Jeffrey [name], pt has had issues with SOB x6 months and went to see pulmonologist in [place], [place]. The pulmonologist saw the pleural effusion and instead of sending pt to hospital to be admitted, he sent pt home. Two days later pt's SOB was a lot worse and due to that, pt went to [facility] and then [employer] transferred pt to [facility] [place] due to the pleural effusion. Pt had a total of about 2 liters removed off of the right lung.  Pt had two COVID tests performed at hospital and both of these came back negative. Pt was at [facility] from 5/23-6/6.  Pt has not had any fever, no recent travel, and no exposure to anyone who has either been sick or exposed to [name].  Pt is scheduled for a hospital follow up with Beth today at 1:30. [name] stated that pt went to Dr. Lucianne Lei Trigt's office yesterday for an appointment to see how things have been doing as Jeffrey [name] stated he believed a port was placed. [name] stated that pt is still having a lot of issues with the SOB and is also weak from what all has happened recently with the hospitalization. With pt's SOB, [name] stated that pt just feels like he cannot catch his breath.  Pt has had home health come over and one time when they were there, Jeffrey [name] stated they told pt his O2 sats were low but after pt did some deep breathing his sats went up. Other than that, his sats have been fine.  Beth, please advise if you are fine with pt still coming into the office for the hospital follow up with you.

## 2019-06-01 NOTE — Assessment & Plan Note (Signed)
-   S/p catheterization per cardiology 6/4 with PCI to proximal LCx with DES - ASA and Brilinta x3 months and then Brilinta x9 months - FU with cardiology outpatient on 6/16

## 2019-06-01 NOTE — Telephone Encounter (Signed)
Called and spoke with [name] letting him know that Jeffrey [name] was fine with pt coming to the office for the appt. I stated to [name] that we do have the no visitor policy currently in place due to [name] but Cheri Rous stated that pt will not go back to exam room without his wife going back with him. [name] stated that pt walks with a walker and she also has to help stabilize pt too.  [name] stated to me that when pt went to see [name] yesterday for the appt, they approved wife to go back with pt and they are also a [facility] practice.  I stated to [name] that I would check to see if they would approve this and then let him know.  Checked with [name] and she stated to me to ask Jeffrey [name] if she would approve it and per [facility], the wife is okay to come back with pt and only her. She will also need to wear a mask and have the COVID screening questions asked as well. Stated this to [name] that she is okay to go back with pt and let him know that she will also need to wear a mask and he stated that she will have one on. I made the foyer people know as [name] stated to let them know that it has been approved. Nothing further needed.

## 2019-06-01 NOTE — Assessment & Plan Note (Addendum)
-   Presumed COPD. Former smoker, no PFTs on file.  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 Elsworth Soho in 4 weeks or NP

## 2019-06-01 NOTE — Assessment & Plan Note (Signed)
-   S/p right thoracentesis 5/25 with pulmonary (Fluid culture showed lymphocytic exudate with low glucose, felt to be unlikely rheumatoid effusion. Cytology negative with a few mesothelial cells) - S/p VATS with pleural biopsy, TALC and right Pleurx catheter by [name] on 5/29 - CXR 6/9 stable small right pleural effusion - Plan right pleurx catheter drainage MWF (150cc off today) - Surgical pathology pending second opinion - Follow up with Dr. Nils Pyle June 26th

## 2019-06-01 NOTE — Patient Instructions (Addendum)
It was a pleasure meeting you today Jeffrey [name]: - Surgical pathology still pending, from my understanding it has been sent for second opinion  Recommendations: - Continue Advair 2 puffs twice daily - Start Spiriva 2 puffs daily  - Albuterol rescue inhaler every 4-6 hours as needed for shortness of breath or wheezing (use with spacer) - Use Incentive spirometer every hour while awake to practice deep breathing exercises - Stay as active as possible  - Stay well hydrated and focus on good protein sources (if you want to be referred for nutrition consults let us know or ask PCP)  Follow-up: - 4 weeks with Dr. Elsworth Soho or MD/ Eustaquio Maize if MDs not available  - Follow-up with Dr. Lucianne Lei tright's office as scheduled and for final pathology results

## 2019-06-01 NOTE — Telephone Encounter (Signed)
Grandson called wanting to change pt's appt - he just got out of the hospital on Saturday - having SOB still - went to another Dr. yesterday and they stated that he should try and move his appt. I put him down for today with [place] wanted me to reach out to you guys due to the SOB - He was tested 2 X in the hospital for SOB -  He does live 45 minutes away so if someone could call soon for transportation reasons   CB# [phone] for pt and [phone] for grandson - Cheri Rous

## 2019-06-01 NOTE — Telephone Encounter (Signed)
Spoke with Marlana Latus RN with [facility] re faxed request for CBC to be drawn at today's visit. Advised that we unfortunately did not receive the fax request in time so the CBC was not drawn today. Advised her she will need to have the home health nurse draw the lab. Marlana Latus RN stated she would contact home health to obtain the lab.

## 2019-06-02 ENCOUNTER — Telehealth: Payer: Self-pay | Admitting: *Deleted

## 2019-06-02 ENCOUNTER — Ambulatory Visit: Payer: Medicare Other | Admitting: Neurology

## 2019-06-02 NOTE — Telephone Encounter (Signed)
Spoke with patient wife and consent to telephone   Confirm consent - "In the setting of the current Covid19 crisis, you are scheduled for a (phone or video) visit with your provider on (date) at (time).  Just as we do with many in-office visits, in order for you to participate in this visit, we must obtain consent.  If you'd like, I can send this to your mychart (if signed up) or email for you to review.  Otherwise, I can obtain your verbal consent now.  All virtual visits are billed to your insurance company just like a normal visit would be.  By agreeing to a virtual visit, we'd like you to understand that the technology does not allow for your provider to perform an examination, and thus may limit your provider's ability to fully assess your condition. If your provider identifies any concerns that need to be evaluated in person, we will make arrangements to do so.  Finally, though the technology is pretty good, we cannot assure that it will always work on either your or our end, and in the setting of a video visit, we may have to convert it to a phone-only visit.  In either situation, we cannot ensure that we have a secure connection.  Are you willing to proceed?" STAFF: Did the patient verbally acknowledge consent to telehealth visit? Document YES/NO here: YES   TELEPHONE CALL NOTE  Jeffrey [name] has been deemed a candidate for a follow-up tele-health visit to limit community exposure during the Covid-19 pandemic. I spoke with the patient via phone to ensure availability of phone/video source, confirm preferred email & phone number, and discuss instructions and expectations.  I reminded Jeffrey [name] to be prepared with any vital sign and/or heart rhythm information that could potentially be obtained via home monitoring, at the time of his visit. I reminded Jeffrey [name] to expect a phone call prior to his visit.  Claude Manges, [place] 06/02/2019 8:55 AM   INSTRUCTIONS FOR DOWNLOADING THE  MYCHART APP TO SMARTPHONE  - The patient must first make sure to have activated MyChart and know their login information - If Apple, go to [employer] and type in MyChart in the search bar and download the app. If Android, ask patient to go to [employer] and type in [place] in the search bar and download the app. The app is free but as with any other app downloads, their phone may require them to verify saved payment information or Apple/Android password.  - The patient will need to then log into the app with their MyChart username and password, and select [facility] [place] [facility] as their healthcare provider to link the account. When it is time for your visit, go to the MyChart app, find appointments, and click Begin Video Visit. Be sure to Select Allow for your device to access the Microphone and Camera for your visit. You will then be connected, and your provider will be with you shortly.  **If they have any issues connecting, or need assistance please contact MyChart service desk [phone])**  **If using a computer, in order to ensure the best quality for their visit they will need to use either of the following Internet Browsers: [employer], or Google Chrome**  IF USING DOXIMITY or DOXY.ME - The patient will receive a link just prior to their visit by text.     FULL LENGTH CONSENT FOR TELE-HEALTH VISIT   I hereby voluntarily request, consent and authorize [facility]  and its employed or contracted physicians, Engineer, materials, nurse practitioners or other licensed health care professionals (the Practitioner), to provide me with telemedicine health care services (the "Services") as deemed necessary by the treating Practitioner. I acknowledge and consent to receive the Services by the Practitioner via telemedicine. I understand that the telemedicine visit will involve communicating with the Practitioner through live audiovisual communication technology and the disclosure of  certain medical information by electronic transmission. I acknowledge that I have been given the opportunity to request an in-person assessment or other available alternative prior to the telemedicine visit and am voluntarily participating in the telemedicine visit.  I understand that I have the right to withhold or withdraw my consent to the use of telemedicine in the course of my care at any time, without affecting my right to future care or treatment, and that the Practitioner or I may terminate the telemedicine visit at any time. I understand that I have the right to inspect all information obtained and/or recorded in the course of the telemedicine visit and may receive copies of available information for a reasonable fee.  I understand that some of the potential risks of receiving the Services via telemedicine include:  [name] Kitchen Delay or interruption in medical evaluation due to technological equipment failure or disruption; . Information transmitted may not be sufficient (e.g. poor resolution of images) to allow for appropriate medical decision making by the Practitioner; and/or  . In rare instances, security protocols could fail, causing a breach of personal health information.  Furthermore, I acknowledge that it is my responsibility to provide information about my medical history, conditions and care that is complete and accurate to the best of my ability. I acknowledge that Practitioner's advice, recommendations, and/or decision may be based on factors not within their control, such as incomplete or inaccurate data provided by me or distortions of diagnostic images or specimens that may result from electronic transmissions. I understand that the practice of medicine is not an exact science and that Practitioner makes no warranties or guarantees regarding treatment outcomes. I acknowledge that I will receive a copy of this consent concurrently upon execution via email to the email address I last provided but  may also request a printed copy by calling the office of [facility].    I understand that my insurance will be billed for this visit.   I have read or had this consent read to me. . I understand the contents of this consent, which adequately explains the benefits and risks of the Services being provided via telemedicine.  . I have been provided ample opportunity to ask questions regarding this consent and the Services and have had my questions answered to my satisfaction. . I give my informed consent for the services to be provided through the use of telemedicine in my medical care  By participating in this telemedicine visit I agree to the above.

## 2019-06-07 ENCOUNTER — Other Ambulatory Visit: Payer: Self-pay

## 2019-06-07 ENCOUNTER — Encounter: Payer: Self-pay | Admitting: Physician Assistant

## 2019-06-07 ENCOUNTER — Telehealth (INDEPENDENT_AMBULATORY_CARE_PROVIDER_SITE_OTHER): Payer: Medicare Other | Admitting: Physician Assistant

## 2019-06-07 VITALS — BP 122/70 | Ht 73.0 in | Wt 201.0 lb

## 2019-06-07 DIAGNOSIS — J90 Pleural effusion, not elsewhere classified: Secondary | ICD-10-CM

## 2019-06-07 DIAGNOSIS — E785 Hyperlipidemia, unspecified: Secondary | ICD-10-CM

## 2019-06-07 DIAGNOSIS — Z7189 Other specified counseling: Secondary | ICD-10-CM

## 2019-06-07 DIAGNOSIS — I214 Non-ST elevation (NSTEMI) myocardial infarction: Secondary | ICD-10-CM

## 2019-06-07 DIAGNOSIS — I350 Nonrheumatic aortic (valve) stenosis: Secondary | ICD-10-CM

## 2019-06-07 NOTE — Patient Instructions (Signed)
Medication Instructions:   Your physician recommends that you continue on your current medications as directed. Please refer to the Current Medication list given to you today.  If you need a refill on your cardiac medications before your next appointment, please call your pharmacy.   Lab work: NONE ORDERED  TODAY  If you have labs (blood work) drawn today and your tests are completely normal, you will receive your results only by: [name] Kitchen MyChart Message (if you have MyChart) OR . A paper copy in the mail If you have any lab test that is abnormal or we need to change your treatment, we will call you to review the results.  Testing/Procedures: NONE ORDERED  TODAY  Follow-Up: At [facility], you and your health needs are our priority.  As part of our continuing mission to provide you with exceptional heart care, we have created designated Provider Care Teams.  These Care Teams include your primary Cardiologist (physician) and Advanced Practice Providers (APPs -  Physician Assistants and Nurse Practitioners) who all work together to provide you with the care you need, when you need it. You will need a follow up appointment  You may see  Dr. Radford Pax in 2 months  or one of the following Advanced Practice Providers on your designated Care Team:   [place], PA-C Melina Copa, PA-C . Ermalinda Barrios, PA-C  Any Other Special Instructions Will Be Listed Below (If Applicable).  You have been referred to [facility] someone will contact you with an appointment

## 2019-06-07 NOTE — Progress Notes (Signed)
Virtual Visit via Telephone Note   This visit type was conducted due to national recommendations for restrictions regarding the COVID-19 Pandemic (e.g. social distancing) in an effort to limit this patient's exposure and mitigate transmission in our community.  Due to his co-morbid illnesses, this patient is at least at moderate risk for complications without adequate follow up.  This format is felt to be most appropriate for this patient at this time.  The patient did not have access to video technology/had technical difficulties with video requiring transitioning to audio format only (telephone).  All issues noted in this document were discussed and addressed.  No physical exam could be performed with this format.  Please refer to the patient's chart for his  consent to telehealth for [facility].   Date:  06/07/2019   ID:  Jeffrey [name], DOB 01-21-1934, MRN [id number]  Patient Location: Home Provider Location: Home  PCP:  [name], MD  Cardiologist:  Fransico Him, MD   Electrophysiologist:  None   Evaluation Performed:  Follow-Up Visit  Chief Complaint: Posthospitalization follow-up, non-STEMI treated with DES to the LCx  History of Present Illness:    Jeffrey [name] is a 85 y.o. male with coronary artery disease status post non-STEMI in 2011 followed by CABG.  He underwent PCI to the LCx in 2012 and PCI to the LAD in 2013.  He was previously followed At [facility] and then in [place].  Other history includes aortic stenosis, asthma, sleep apnea, Parkinson's, hypothyroidism, anxiety, ITP.  He was recently noted to have loculated pleural effusion and was admitted for thoracentesis then VATS with biopsy.  There was concern for mesothelioma.  Postop, he suffered a non-ST ovation myocardial infarction.  He was seen by Dr. Radford Pax for cardiology.  Cardiac catheterization demonstrated a patent LIMA-LAD and occluded vein graft to the OM and vein graft to the L PDA.  He had significant  stenosis in the proximal LCx which was treated with a DES.  Dr. end performed his cardiac catheterization and intervention and recommended dual antiplatelet therapy for 3 months and then remain on Brilinta alone for an additional 9 months.  He had a right Pleurx catheter placed.  He has followed up with pulmonology and cardiothoracic surgery.  His effusion remains small.  He is seen today for posthospitalization follow-up.  Since discharge, he notes chest tightness and cough with taking a deep breath.  This symptom was present prior to his admission to the hospital and has not changed.  He has not had any exertional chest symptoms.  He has chronic exertional shortness of breath.  He thinks that this may be somewhat better.  He has not had orthopnea or lower extremity swelling.  He has not had syncope.  He has not had fever or bleeding issues.  He notes that his right groin site is stable without pain or swelling.  The patient does not have symptoms concerning for COVID-19 infection (fever, chills, cough, or new shortness of breath).    Past Medical History:  Diagnosis Date  . Anxiety   . Aortic stenosis    mild AS by 07/20/13 echo ([facility])  . Arthritis    "all over"  . Coronary artery disease    NSTEMI 06/2010, CABG x3=> [place]->LAD, SVG->OM1, SVG->PDA, DES LCx 10/2011, DES LAD 12/2011  . Depression   . GERD (gastroesophageal reflux disease)   . H/O hiatal hernia   . Hearing aid worn    B/L  .  High cholesterol   . History of blood transfusion reaction 03/20/2013   "he almost died; he has to get irradiated blood next time"  . Hypertension   . Hypothyroidism   . Ischemic cardiomyopathy   . ITP (idiopathic thrombocytopenic purpura)    Dr. Gaynelle Arabian, on [place]  . Myocardial infarction ([place]) 2010/03/20  . Pneumonia 1940's X 1; March 20, 2014 X 1  . Rheumatoid arthritis ([place])   . Shortness of breath    with exertion, has not been very active  . Sleep apnea   . Wears glasses   . Wears  partial dentures    Past Surgical History:  Procedure Laterality Date  . APPENDECTOMY    . BACK SURGERY    . CATARACT EXTRACTION, BILATERAL    . CHEST TUBE INSERTION Right 05/20/2019   Procedure: INSERTION PLEURAL DRAINAGE CATHETER;  Surgeon: Ivin Poot, MD;  Location: [place];  Service: Thoracic;  Laterality: Right;  . COLONOSCOPY    . CORONARY ANGIOPLASTY WITH STENT PLACEMENT     DES Lcx 10/2011, DES LAD 12/2011  . CORONARY ARTERY BYPASS GRAFT  Mar 20, 2010   "CABG X3"  . CORONARY STENT INTERVENTION N/A 05/26/2019   Procedure: CORONARY STENT INTERVENTION;  Surgeon: Nelva Bush, MD;  Location: [place] [facility];  Service: Cardiovascular;  Laterality: N/A;  . GASTROC RECESSION EXTREMITY Right 07/06/2015   Pasty Spillers (orthopedics- [place], [place])  . HERNIA REPAIR    . HIATAL HERNIA REPAIR    . KNEE ARTHROSCOPY Left 06/22/2014   w/I&D  . KNEE ARTHROSCOPY Left 06/22/2014   Procedure: [place];  Surgeon: Hessie Dibble, MD;  Location: [place];  Service: Orthopedics;  Laterality: Left;  . LEFT HEART CATH AND CORS/GRAFTS ANGIOGRAPHY N/A 05/26/2019   Procedure: LEFT HEART CATH AND CORS/GRAFTS ANGIOGRAPHY;  Surgeon: Nelva Bush, MD;  Location: [place] [facility];  Service: Cardiovascular;  Laterality: N/A;  . LUMBAR DISC SURGERY  1960's?  . MULTIPLE TOOTH EXTRACTIONS    . PLEURAL BIOPSY Right 05/20/2019   Procedure: PLEURAL BIOPSY;  Surgeon: Ivin Poot, MD;  Location: [place];  Service: Thoracic;  Laterality: Right;  . SHOULDER ARTHROSCOPY Right 04/09/2017   Procedure: ARTHROSCOPY SHOULDER;  Surgeon: Melrose Nakayama, MD;  Location: [place];  Service: Orthopedics;  Laterality: Right;  . STERNAL CLOSURE     "wires from OHS taken out; plate put in" (01/28/3784)  . SYNOVECTOMY Left 08/17/2014   Procedure: SYNOVECTOMY LEFT KNEE;  Surgeon: Hessie Dibble, MD;  Location: [place];  Service: Orthopedics;  Laterality: Left;  . TOTAL ANKLE ARTHROPLASTY Right 07/06/2015    Pasty Spillers (orthopedics- [facility])  . TOTAL HIP ARTHROPLASTY Right 06/29/2018   Procedure: RIGHT TOTAL HIP ARTHROPLASTY ANTERIOR APPROACH;  Surgeon: Melrose Nakayama, MD;  [facility];  Service: Orthopedics;  Laterality: Right;  . TOTAL HIP ARTHROPLASTY Left 06/09/2016   Taylr Meuth Streater Claiborne Billings (orthopedics- [place])  . TOTAL KNEE ARTHROPLASTY Left 11/24/2014   Procedure: TOTAL KNEE ARTHROPLASTY;  Surgeon: Hessie Dibble, MD;  Location: [place];  Service: Orthopedics;  Laterality: Left;  [name] Kitchen VIDEO ASSISTED THORACOSCOPY Right 05/20/2019   Procedure: VIDEO ASSISTED THORACOSCOPY;  Surgeon: Prescott Gum, [place], MD;  Location: [place];  Service: Thoracic;  Laterality: Right;     Current Meds  Medication Sig  . albuterol (VENTOLIN HFA) 108 (90 Base) MCG/ACT inhaler Inhale 2 puffs into the lungs every 6 (six) hours as needed for wheezing or shortness of breath.  . ALPRAZolam (XANAX) 0.25 MG tablet  Take 1 tablet (0.25 mg total) by mouth 2 (two) times daily as needed for anxiety.  [name] Kitchen aspirin EC 81 MG tablet Take 1 tablet (81 mg total) by mouth daily.  . carbidopa-levodopa (SINEMET IR) 25-100 MG tablet Take 1 tablet by mouth 3 (three) times daily.  . Fluticasone-Salmeterol (ADVAIR) 100-50 MCG/DOSE AEPB Inhale 1 puff into the lungs daily.  . folic acid (FOLVITE) 1 MG tablet Take 1 mg by mouth daily.  [name] Kitchen levothyroxine (SYNTHROID, LEVOTHROID) 200 MCG tablet Take 200 mcg by mouth at bedtime.   . magnesium oxide (MAG-OX) 400 (241.3 Mg) MG tablet Take 1 tablet (400 mg total) by mouth 2 (two) times daily for 30 days.  . methotrexate (RHEUMATREX) 2.5 MG tablet Take 15 mg by mouth once a week. Caution:Chemotherapy. Protect from light.  . Omega-3 Fatty Acids (FISH OIL) 1200 MG CAPS Take 1,200 mg by mouth at bedtime. 360 MG OMEGA-3  . omeprazole (PRILOSEC) 40 MG capsule Take 40 mg by mouth at bedtime.   . predniSONE (DELTASONE) 10 MG tablet Take 10 mg by mouth daily with breakfast.  . rOPINIRole (REQUIP) 0.5 MG  tablet Take 0.5 mg by mouth at bedtime.  [name] Kitchen Spacer/Aero-Holding Chambers (AEROCHAMBER MV) inhaler Use as instructed  . ticagrelor (BRILINTA) 90 MG TABS tablet Take 1 tablet (90 mg total) by mouth 2 (two) times daily.  . Tiotropium Bromide Monohydrate (SPIRIVA RESPIMAT) 1.25 MCG/ACT AERS Inhale 2 puffs into the lungs daily.  . Vitamin D, Ergocalciferol, (DRISDOL) 1.25 MG (50000 UT) CAPS capsule Take 50,000 Units by mouth every 7 (seven) days.     Allergies:   Lipitor [atorvastatin], Methylprednisolone, Other, Sulfa antibiotics, and Doxycycline   Social History   Tobacco Use  . Smoking status: Former Smoker    Packs/day: 1.00    Years: 15.00    Pack years: 15.00    Types: Cigarettes    Quit date: 12/22/1966    Years since quitting: 52.4  . Smokeless tobacco: Former Systems developer    Types: Chew  . Tobacco comment: "quit smoking ~ late ~ 60's; quit chewing in the 1970's"  Substance Use Topics  . Alcohol use: No  . Drug use: No     Family Hx: The patient's family history includes Alzheimer's disease in his father; Arthritis in an other family member; Healthy in his son; Heart disease in his mother and another family member; Rheum arthritis in his brother and sister.  ROS:   Please see the history of present illness.     All other systems reviewed and are negative.   Prior CV studies:   The following studies were reviewed today:  Cardiac catheterization 05/26/19 LM distal 100-CTO RI ostial 90 LCx ostial 90, mid 30, distal 50; OM1 100-CTO, OM2 100-CTO RCA proximal 100-CTO SVG-L PDA 100-CTO RIMA-LAD patent SVG-OM1 100-CTO LVEDP 25-30 2.75 x 20 mm Synergy DES to the LCx    Echocardiogram 05/15/2019 EF 29-92, grade 1 diastolic dysfunction, mild AI, mild AS (mean 16), mild dilation of the aortic root (42)   Labs/Other Tests and Data Reviewed:    EKG:  No ECG reviewed.  Recent Labs: 05/14/2019: B Natriuretic Peptide 77.0 05/19/2019: TSH 2.874 05/27/2019: Magnesium 1.8 05/28/2019: ALT 8;  BUN 10; Creatinine, Ser 1.15; Hemoglobin 12.2; Platelets 271; Potassium 3.7; Sodium 137   Recent Lipid Panel No results found for: CHOL, TRIG, HDL, CHOLHDL, LDLCALC, LDLDIRECT  Wt Readings from Last 3 Encounters:  06/07/19 201 lb (91.2 kg)  06/01/19 205 lb (93 kg)  05/31/19 210 lb (  95.3 kg)     Objective:    Vital Signs:  BP 122/70   Ht 6\' 1"  (1.854 m)   Wt 201 lb (91.2 kg)   BMI 26.52 kg/m    VITAL SIGNS:  reviewed GEN:  no acute distress RESPIRATORY:  No labored breathing NEURO:  Alert and oriented PSYCH:  His mood seems appropriate  ASSESSMENT & PLAN:    NSTEMI (non-ST elevated myocardial infarction) ([place]) -  Plan: History of CABG in 2011 and prior PCI to the LCx and LAD.  Recent admission for VATS due to large right pleural effusion.  This was complicated by a non-ST ovation myocardial infarction.  As noted, 1/2 grafts patent.  He underwent PCI with a DES to the LCx.  It was recommended by Dr. Saunders Revel to remain on dual antiplatelet therapy for 90 days then remain on Brilinta alone for an additional 9 months.  I have advised the patient to remain on both aspirin and Brilinta for now and we will make further determination regarding his antiplatelet therapy at his next visit.  Hyperlipidemia, unspecified hyperlipidemia type -  Plan: He is intolerant to statins.  He is not sure if he is ever taken Zetia.  I will refer him to our lipid clinic for further evaluation and management.  Pleural effusion on right -  Plan: Continue follow-up with pulmonology and cardiothoracic surgery.  Aortic valve stenosis, etiology of cardiac valve disease unspecified -  Plan: Mild by recent echo.  COVID-19 Education: The signs and symptoms of COVID-19 were discussed with the patient and how to seek care for testing (follow up with PCP or arrange E-visit).  The importance of social distancing was discussed today.  Time:   Today, I have spent 11 minutes with the patient with telehealth technology  discussing the above problems.     Medication Adjustments/Labs and Tests Ordered: Current medicines are reviewed at length with the patient today.  Concerns regarding medicines are outlined above.   Tests Ordered: No orders of the defined types were placed in this encounter.   Medication Changes: No orders of the defined types were placed in this encounter.   Follow Up:  In Person in 2 month(s) with Dr. Radford Pax (patient prefers to follow-up with our group in [place])  Signed, Richardson Dopp, [place]  06/07/2019 4:35 PM    [place]

## 2019-06-08 ENCOUNTER — Encounter (HOSPITAL_COMMUNITY): Payer: Self-pay | Admitting: Cardiothoracic Surgery

## 2019-06-08 ENCOUNTER — Other Ambulatory Visit: Payer: Self-pay | Admitting: *Deleted

## 2019-06-08 DIAGNOSIS — E785 Hyperlipidemia, unspecified: Secondary | ICD-10-CM

## 2019-06-10 ENCOUNTER — Other Ambulatory Visit: Payer: Self-pay | Admitting: Cardiothoracic Surgery

## 2019-06-10 DIAGNOSIS — J90 Pleural effusion, not elsewhere classified: Secondary | ICD-10-CM

## 2019-06-13 ENCOUNTER — Encounter: Payer: Self-pay | Admitting: Cardiothoracic Surgery

## 2019-06-13 ENCOUNTER — Ambulatory Visit
Admission: RE | Admit: 2019-06-13 | Discharge: 2019-06-13 | Disposition: A | Discharge status: Home / Self Care | Payer: Medicare Other | Source: Ambulatory Visit | Attending: Cardiothoracic Surgery | Admitting: Cardiothoracic Surgery

## 2019-06-13 ENCOUNTER — Other Ambulatory Visit: Payer: Self-pay

## 2019-06-13 ENCOUNTER — Ambulatory Visit (INDEPENDENT_AMBULATORY_CARE_PROVIDER_SITE_OTHER): Payer: Self-pay | Admitting: Cardiothoracic Surgery

## 2019-06-13 VITALS — BP 132/69 | HR 82 | Temp 97.9°F | Resp 20 | Ht 73.0 in | Wt 201.0 lb

## 2019-06-13 DIAGNOSIS — Z9689 Presence of other specified functional implants: Secondary | ICD-10-CM

## 2019-06-13 DIAGNOSIS — J90 Pleural effusion, not elsewhere classified: Secondary | ICD-10-CM

## 2019-06-13 NOTE — Progress Notes (Signed)
PCP is [name], MD Referring Provider is Rigoberto Noel, MD  Chief Complaint  Patient presents with  . Pleural Effusion    f/u with CXR, draining PleurX every Mon/Wed/Fri's, average amont 150-200cc's    HPI: Patient returns for scheduled follow-up after placement of a right Pleurx catheter and VATS for pleural biopsy a month ago.  The biopsy initially was read as an inflammatory plaque.  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  Patient had a non-STEMI after the VATS-pleural biopsy and Pleurx catheter and subsequently underwent a PCI to the circumflex and is on Brilinta.  Patient had a previous CABG 9 years ago at [facility].  Patient will be referred to thoracic oncology for discussion of his malignant epithelioid pleural tumor.  Pleurx catheter drainage is 100-200 cc Monday Wednesday and Friday. The fluid is serosanguineous, probably from the surgery and the Brilinta. Patient will reduce his pleural drainage schedule to Mondays and Thursdays because of low volume. Chest x-ray today shows minimal right pleural effusion, catheter in good position.  Symptomatically the patient has low energy.  He denies orthopnea PND or dyspnea on exertion.  He has had poor appetite, weight loss, and depression.  His medical physician has placed him on Zoloft and Xanax.  An alternative might be Remeron to help his appetite and depression and any insomnia.   Past Medical History:  Diagnosis Date  . Anxiety   . Aortic stenosis    mild AS by 07/20/13 echo ([facility])  . Arthritis    "all over"  . Coronary artery disease    NSTEMI 06/2010, CABG x3=> [place]->LAD, SVG->OM1, SVG->PDA, DES LCx 10/2011, DES LAD 12/2011  . Depression   . GERD (gastroesophageal reflux disease)   . H/O hiatal hernia   . Hearing aid worn    B/L  . High cholesterol   . History of blood transfusion reaction 03-22-2013   "he almost died;  he has to get irradiated blood next time"  . Hypertension   . Hypothyroidism   . Ischemic cardiomyopathy   . ITP (idiopathic thrombocytopenic purpura)    Dr. Gaynelle Arabian, on [place]  . Myocardial infarction ([place]) 2010/03/22  . Pneumonia 1940's X 1; 03-22-14 X 1  . Rheumatoid arthritis ([place])   . Shortness of breath    with exertion, has not been very active  . Sleep apnea   . Wears glasses   . Wears partial dentures     Past Surgical History:  Procedure Laterality Date  . APPENDECTOMY    . BACK SURGERY    . CATARACT EXTRACTION, BILATERAL    . CHEST TUBE INSERTION Right 05/20/2019   Procedure: INSERTION PLEURAL DRAINAGE CATHETER;  Surgeon: Ivin Poot, MD;  Location: [place];  Service: Thoracic;  Laterality: Right;  . COLONOSCOPY    . CORONARY ANGIOPLASTY WITH STENT PLACEMENT     DES Lcx 10/2011, DES LAD 12/2011  . CORONARY ARTERY BYPASS GRAFT  03-22-10   "CABG X3"  . CORONARY STENT INTERVENTION N/A 05/26/2019   Procedure: CORONARY STENT INTERVENTION;  Surgeon: Nelva Bush, MD;  Location: [place] [facility];  Service: Cardiovascular;  Laterality: N/A;  . GASTROC RECESSION EXTREMITY Right 07/06/2015   Pasty Spillers (orthopedics- [place], [place])  . HERNIA REPAIR    . HIATAL HERNIA REPAIR    . KNEE ARTHROSCOPY Left 06/22/2014   w/I&D  . KNEE ARTHROSCOPY Left 06/22/2014   Procedure: [place];  Surgeon:  Hessie Dibble, MD;  Location: [place];  Service: Orthopedics;  Laterality: Left;  . LEFT HEART CATH AND CORS/GRAFTS ANGIOGRAPHY N/A 05/26/2019   Procedure: LEFT HEART CATH AND CORS/GRAFTS ANGIOGRAPHY;  Surgeon: Nelva Bush, MD;  Location: [place] [facility];  Service: Cardiovascular;  Laterality: N/A;  . LUMBAR DISC SURGERY  1960's?  . MULTIPLE TOOTH EXTRACTIONS    . PLEURAL BIOPSY Right 05/20/2019   Procedure: PLEURAL BIOPSY;  Surgeon: Ivin Poot, MD;  Location: [place];  Service: Thoracic;  Laterality: Right;  . SHOULDER ARTHROSCOPY Right 04/09/2017    Procedure: ARTHROSCOPY SHOULDER;  Surgeon: Melrose Nakayama, MD;  Location: [place];  Service: Orthopedics;  Laterality: Right;  . STERNAL CLOSURE     "wires from OHS taken out; plate put in" (01/30/9241)  . SYNOVECTOMY Left 08/17/2014   Procedure: SYNOVECTOMY LEFT KNEE;  Surgeon: Hessie Dibble, MD;  Location: [place];  Service: Orthopedics;  Laterality: Left;  . TOTAL ANKLE ARTHROPLASTY Right 07/06/2015   Pasty Spillers (orthopedics- [facility])  . TOTAL HIP ARTHROPLASTY Right 06/29/2018   Procedure: RIGHT TOTAL HIP ARTHROPLASTY ANTERIOR APPROACH;  Surgeon: Melrose Nakayama, MD;  [facility];  Service: Orthopedics;  Laterality: Right;  . TOTAL HIP ARTHROPLASTY Left 06/09/2016   Scott Streater Claiborne Billings (orthopedics- [place])  . TOTAL KNEE ARTHROPLASTY Left 11/24/2014   Procedure: TOTAL KNEE ARTHROPLASTY;  Surgeon: Hessie Dibble, MD;  Location: [place];  Service: Orthopedics;  Laterality: Left;  [name] Kitchen VIDEO ASSISTED THORACOSCOPY Right 05/20/2019   Procedure: VIDEO ASSISTED THORACOSCOPY;  Surgeon: Ivin Poot, MD;  [facility];  Service: Thoracic;  Laterality: Right;    Family History  Problem Relation Age of Onset  . Heart disease Other   . Arthritis Other   . Heart disease Mother   . Alzheimer's disease Father   . Rheum arthritis Sister   . Rheum arthritis Brother   . Healthy Son     Social History Social History   Tobacco Use  . Smoking status: Former Smoker    Packs/day: 1.00    Years: 15.00    Pack years: 15.00    Types: Cigarettes    Quit date: 12/22/1966    Years since quitting: 52.5  . Smokeless tobacco: Former Systems developer    Types: Chew  . Tobacco comment: "quit smoking ~ late ~ 60's; quit chewing in the 1970's"  Substance Use Topics  . Alcohol use: No  . Drug use: No    Current Outpatient Medications  Medication Sig Dispense Refill  . albuterol (VENTOLIN HFA) 108 (90 Base) MCG/ACT inhaler Inhale 2 puffs into the lungs every 6 (six) hours as needed for wheezing or shortness  of breath. 1 Inhaler 6  . ALPRAZolam (XANAX) 0.25 MG tablet Take 1 tablet (0.25 mg total) by mouth 2 (two) times daily as needed for anxiety. 20 tablet 0  . aspirin EC 81 MG tablet Take 1 tablet (81 mg total) by mouth daily. 30 tablet 2  . carbidopa-levodopa (SINEMET IR) 25-100 MG tablet Take 1 tablet by mouth 3 (three) times daily. 270 tablet 1  . Fluticasone-Salmeterol (ADVAIR) 100-50 MCG/DOSE AEPB Inhale 1 puff into the lungs daily.    . folic acid (FOLVITE) 1 MG tablet Take 1 mg by mouth daily.    [name] Kitchen levothyroxine (SYNTHROID, LEVOTHROID) 200 MCG tablet Take 200 mcg by mouth at bedtime.     . magnesium oxide (MAG-OX) 400 (241.3 Mg) MG tablet Take 1 tablet (400 mg total) by mouth 2 (two)  times daily for 30 days. 60 tablet 0  . methotrexate (RHEUMATREX) 2.5 MG tablet Take 15 mg by mouth once a week. Caution:Chemotherapy. Protect from light.    . Omega-3 Fatty Acids (FISH OIL) 1200 MG CAPS Take 1,200 mg by mouth at bedtime. 360 MG OMEGA-3    . omeprazole (PRILOSEC) 40 MG capsule Take 40 mg by mouth at bedtime.     . predniSONE (DELTASONE) 10 MG tablet Take 10 mg by mouth daily with breakfast.    . rOPINIRole (REQUIP) 0.5 MG tablet Take 0.5 mg by mouth at bedtime.    [name] Kitchen Spacer/Aero-Holding Chambers (AEROCHAMBER MV) inhaler Use as instructed 1 each 0  . ticagrelor (BRILINTA) 90 MG TABS tablet Take 1 tablet (90 mg total) by mouth 2 (two) times daily. 60 tablet 2  . Tiotropium Bromide Monohydrate (SPIRIVA RESPIMAT) 1.25 MCG/ACT AERS Inhale 2 puffs into the lungs daily. 4 g 3  . Vitamin D, Ergocalciferol, (DRISDOL) 1.25 MG (50000 UT) CAPS capsule Take 50,000 Units by mouth every 7 (seven) days.     No current facility-administered medications for this visit.     Allergies  Allergen Reactions  . Lipitor [Atorvastatin] Other (See Comments)    muscle spasms cramps  . Methylprednisolone Other (See Comments)    cramps cramps  . Other     If patient is to receive blood - blood must be treated witgh  radiation because his body will not accept a normal infusion   . Sulfa Antibiotics Itching  . Doxycycline Rash    Doxycycline caused itching redness on scalp and head Doxycycline caused itching redness on scalp and head    Review of Systems   Poor appetite Depression 10 pound weight loss since surgery  BP 132/69   Pulse 82   Temp 97.9 F (36.6 C) (Skin)   Resp 20   Ht 6\' 1"  (1.854 m)   Wt 201 lb (91.2 kg)   SpO2 94% Comment: RA  BMI 26.52 kg/m  Physical Exam      Exam    General- alert and comfortable    Neck- no JVD, no cervical adenopathy palpable, no carotid bruit   Lungs- clear without rales, wheezes.  Right VATS incision well-healed.  Pleurx catheter dressing clean and dry.   Cor- regular rate and rhythm, no murmur , gallop   Abdomen- soft, non-tender   Extremities - warm, non-tender, minimal edema   Neuro- oriented, appropriate, no focal weakness   Diagnostic Tests: Chest x-ray with COPD, blunting of the right costophrenic angle, right Pleurx catheter in position  Impression: Recurrent right pleural effusion secondary to mesothelioma Heart disease status post CABG 9 years ago and recent postop non-STEMI with PCI of proximal circumflex Parkinson's disease Depression Osteoarthritis  Plan: Continue Pleurx drainage schedule but reduce to Monday-Thursday Return in 1 month with chest x-ray Will refer to thoracic oncology, Dr. Earlie Server to discuss his right malignant epithelioid pleural tumor. Len Childs, MD [facility] [phone]

## 2019-06-14 ENCOUNTER — Telehealth: Payer: Self-pay | Admitting: Internal Medicine

## 2019-06-14 ENCOUNTER — Encounter: Payer: Self-pay | Admitting: *Deleted

## 2019-06-14 NOTE — Progress Notes (Signed)
Oncology Nurse Navigator Documentation  Oncology Nurse Navigator Flowsheets 06/14/2019  Navigator Location [facility]  Referral Date to RadOnc/MedOnc 06/14/2019  Navigator Encounter Type Other/I received referral on Jeffrey [name].  I updated Dr. Julien Nordmann and he can see patient this Thursday at 2pm.  I contacted our new patient coordinator to call and schedule patient for 06/16/2019.    Barriers/Navigation Needs Coordination of Care  Interventions Coordination of Care  Coordination of Care Other  Acuity Level 1  Time Spent with Patient 15

## 2019-06-14 NOTE — Telephone Encounter (Signed)
Received a new patient referral from Dr.Van Trigt for mesothelioma. Pt has been clad and scheduled to see Dr. Julien Nordmann on 6/25 at 2pm w/labs at 145pm per msg from [place]. I lft the appt date and time on his vm. I provided my direct office number to cb to confirm.

## 2019-06-14 NOTE — Telephone Encounter (Signed)
Mr. Rosenberger's wife cld back and confirmed appt w/Dr. Julien Nordmann on 6/25.

## 2019-06-15 ENCOUNTER — Other Ambulatory Visit: Payer: Self-pay | Admitting: Medical Oncology

## 2019-06-15 ENCOUNTER — Telehealth: Payer: Self-pay

## 2019-06-15 ENCOUNTER — Ambulatory Visit: Payer: Medicare Other

## 2019-06-15 DIAGNOSIS — C450 Mesothelioma of pleura: Secondary | ICD-10-CM

## 2019-06-15 NOTE — Telephone Encounter (Signed)
Left vm for pt regarding prescreening questions for appointment on 6/25.

## 2019-06-16 ENCOUNTER — Inpatient Hospital Stay: Payer: Medicare Other

## 2019-06-16 ENCOUNTER — Inpatient Hospital Stay: Payer: Medicare Other | Attending: Internal Medicine | Admitting: Internal Medicine

## 2019-06-16 ENCOUNTER — Inpatient Hospital Stay: Payer: Medicare Other | Admitting: Pulmonary Disease

## 2019-06-16 ENCOUNTER — Encounter: Payer: Self-pay | Admitting: Internal Medicine

## 2019-06-16 ENCOUNTER — Other Ambulatory Visit: Payer: Self-pay

## 2019-06-16 ENCOUNTER — Telehealth: Payer: Self-pay | Admitting: Medical Oncology

## 2019-06-16 VITALS — BP 143/74 | HR 75 | Temp 98.9°F | Resp 20 | Ht 73.0 in | Wt 200.2 lb

## 2019-06-16 DIAGNOSIS — Z79899 Other long term (current) drug therapy: Secondary | ICD-10-CM | POA: Diagnosis not present

## 2019-06-16 DIAGNOSIS — Z87891 Personal history of nicotine dependence: Secondary | ICD-10-CM

## 2019-06-16 DIAGNOSIS — Z7189 Other specified counseling: Secondary | ICD-10-CM | POA: Insufficient documentation

## 2019-06-16 DIAGNOSIS — C450 Mesothelioma of pleura: Secondary | ICD-10-CM | POA: Insufficient documentation

## 2019-06-16 DIAGNOSIS — E538 Deficiency of other specified B group vitamins: Secondary | ICD-10-CM | POA: Diagnosis not present

## 2019-06-16 DIAGNOSIS — Z800 Family history of malignant neoplasm of digestive organs: Secondary | ICD-10-CM

## 2019-06-16 DIAGNOSIS — J90 Pleural effusion, not elsewhere classified: Secondary | ICD-10-CM

## 2019-06-16 DIAGNOSIS — Z5111 Encounter for antineoplastic chemotherapy: Secondary | ICD-10-CM

## 2019-06-16 LAB — CMP (CANCER CENTER ONLY)
ALT: 9 U/L (ref 0–44)
AST: 24 U/L (ref 15–41)
Albumin: 3.2 g/dL — ABNORMAL LOW (ref 3.5–5.0)
Alkaline Phosphatase: 88 U/L (ref 38–126)
Anion gap: 10 (ref 5–15)
BUN: 20 mg/dL (ref 8–23)
CO2: 27 mmol/L (ref 22–32)
Calcium: 8.8 mg/dL — ABNORMAL LOW (ref 8.9–10.3)
Chloride: 103 mmol/L (ref 98–111)
Creatinine: 1.3 mg/dL — ABNORMAL HIGH (ref 0.61–1.24)
GFR, Est AFR Am: 59 mL/min — ABNORMAL LOW (ref >60)
GFR, Estimated: 51 mL/min — ABNORMAL LOW (ref >60)
Glucose, Bld: 135 mg/dL — ABNORMAL HIGH (ref 70–99)
Potassium: 4.5 mmol/L (ref 3.5–5.1)
Sodium: 140 mmol/L (ref 135–145)
Total Bilirubin: 0.3 mg/dL (ref 0.3–1.2)
Total Protein: 7.4 g/dL (ref 6.5–8.1)

## 2019-06-16 LAB — CBC WITH DIFFERENTIAL (CANCER CENTER ONLY)
Abs Immature Granulocytes: 0.03 10*3/uL (ref 0.00–0.07)
Basophils Absolute: 0 10*3/uL (ref 0.0–0.1)
Basophils Relative: 0 %
Eosinophils Absolute: 0.1 10*3/uL (ref 0.0–0.5)
Eosinophils Relative: 1 %
HCT: 41 % (ref 39.0–52.0)
Hemoglobin: 13 g/dL (ref 13.0–17.0)
Immature Granulocytes: 0 %
Lymphocytes Relative: 9 %
Lymphs Abs: 0.8 10*3/uL (ref 0.7–4.0)
MCH: 30.5 pg (ref 26.0–34.0)
MCHC: 31.7 g/dL (ref 30.0–36.0)
MCV: 96.2 fL (ref 80.0–100.0)
Monocytes Absolute: 0.9 10*3/uL (ref 0.1–1.0)
Monocytes Relative: 10 %
Neutro Abs: 7.2 10*3/uL (ref 1.7–7.7)
Neutrophils Relative %: 80 %
Platelet Count: 265 10*3/uL (ref 150–400)
RBC: 4.26 MIL/uL (ref 4.22–5.81)
RDW: 15.1 % (ref 11.5–15.5)
WBC Count: 9.1 10*3/uL (ref 4.0–10.5)
nRBC: 0 % (ref 0.0–0.2)

## 2019-06-16 MED ORDER — LIDOCAINE-PRILOCAINE 2.5-2.5 % EX CREA: 1.0000 "application " | TOPICAL_CREAM | Freq: Once | CUTANEOUS | 0 refills | Status: AC

## 2019-06-16 MED ORDER — CYANOCOBALAMIN 1000 MCG/ML IJ SOLN
1000.0000 ug | Freq: Once | INTRAMUSCULAR | Status: AC
  Administered 2019-06-16: 1000 ug via INTRAMUSCULAR

## 2019-06-16 MED ORDER — PROCHLORPERAZINE MALEATE 10 MG PO TABS: 10.0000 mg | ORAL_TABLET | Freq: Four times a day (QID) | ORAL | 0 refills | Status: DC | PRN

## 2019-06-16 MED ORDER — FOLIC ACID 1 MG PO TABS: 1.0000 mg | ORAL_TABLET | Freq: Every day | ORAL | 4 refills | Status: AC

## 2019-06-16 MED ORDER — DEXAMETHASONE 4 MG PO TABS: ORAL_TABLET | ORAL | 1 refills | Status: DC

## 2019-06-16 MED ORDER — CYANOCOBALAMIN 1000 MCG/ML IJ SOLN
INTRAMUSCULAR | Status: AC
  Filled 2019-06-16: qty 1

## 2019-06-16 NOTE — Telephone Encounter (Signed)
Unable to reach pt at both numbers. LVM on pt home phone to call me back. . LVM on [name] phone.

## 2019-06-16 NOTE — Progress Notes (Signed)
START ON PATHWAY REGIMEN - Mesothelioma     A cycle is every 21 days:     Pemetrexed      Carboplatin   **Always confirm dose/schedule in your pharmacy ordering system**  Patient Characteristics: Newly Diagnosed, Preoperative or Nonsurgical Candidate (Clinical Staging), Unresectable, First Line Therapeutic Status: Newly Diagnosed, Preoperative or Nonsurgical Candidate (Clinical Staging) AJCC T Category: cT3 AJCC N Category: cNX AJCC 8 Stage Grouping: Unknown Resectability Status: Unresectable AJCC M Category: cM0 Intent of Therapy: Non-Curative / Palliative Intent, Discussed with Patient

## 2019-06-16 NOTE — Progress Notes (Signed)
[place] [phone]   [phone]  CONSULT NOTE  REFERRING PHYSICIAN: Dr. Tharon Aquas Trigt  REASON FOR CONSULTATION:  85 years old white male recently diagnosed with malignant pleural mesothelioma.  HPI Jeffrey [name] is a 85 y.o. 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  The patient mentions that he has been complaining of cough and shortness of breath since early 2020.  He was referred by his primary care physician to a cardiologist who did a lot of cardiac work-up including Holter monitoring and these were unremarkable.  He finally presented to the hospital and on 05/14/2019 he had a chest x-ray performed that showed small right pleural effusion.  This was followed by CT angiogram of the chest on 05/14/2019 and that showed no history of pulmonary embolism but there was large likely somewhat loculated right-sided pleural effusion.  There was some mild pleural thickening towards the right lung apex and findings can be seen in patient with prior asbestos exposure.  The patient was seen by [name].  On 05/20/2019 the patient underwent right VATS for drainage of loculated right pleural effusion, right pleural biopsies, talc pleurodesis of the right pleural space as well as placement of right Pleurx catheter.   The final pathology ([id number]) after consultation with [facility] pathology was consistent malignant epithelioid neoplasm suggestive of malignant mesothelioma, epithelioid type. In summary, the right lower lobe pleural biopsy shows a malignant epithelioid neoplasm. The aforementioned radiographic findings,  the overall tumor morphology and the limited immunophenotype (MOC-31 negative, CK5/6 positive, WT-1 focal positive) are most suggestive of malignant mesothelioma, epithelioid type. However the diagnosis of carcinoma cannot be entirely excluded. Asbestos exposure can be associated with lung cancer as well as mesothelioma. He continues to have drainage of 150-200 mL 2 times weekly.  Repeat CT angiogram of the chest on 05/23/2019 showed no pulmonary embolism but there was significant interval decrease in the size of the previously demonstrated right-sided pleural effusion.  There was again identified the calcified pleural-based plaques bilaterally. [name] kindly referred the patient to me today for evaluation and recommendation regarding treatment of his condition.  When seen today he continues to complain of the shortness of breath as well as mild cough he denied having any chest pain or hemoptysis.  He lost around 31 pounds in the last 6 months.  He denied having any nausea, vomiting, diarrhea or constipation.  He denied having any headache or visual changes. Family history significant for mother with Alzheimer, brother and sister with rheumatoid arthritis, mother had heart disease and brother with colon cancer. The patient is married and his wife [name] was available by phone during the visit.  He has 2 sons.  He used to work as a Furniture conservator/restorer.  He has a history of smoking for around 15 years but quit December 23, 1963.  He has no history of alcohol or drug abuse.  HPI  Past Medical History:  Diagnosis Date   Anxiety    Aortic stenosis    mild AS by 07/20/13 echo ([facility])   Arthritis    "all over"   Coronary artery disease    NSTEMI 06/2010, CABG x3=> [place]->LAD, SVG->OM1, SVG->PDA, DES LCx 10/2011, DES LAD 12/2011  Depression    GERD (gastroesophageal reflux disease)    H/O hiatal hernia    Hearing aid worn    B/L   High cholesterol    History of blood  transfusion reaction 27-Mar-2013   "he almost died; he has to get irradiated blood next time"   Hypertension    Hypothyroidism    Ischemic cardiomyopathy    ITP (idiopathic thrombocytopenic purpura)    Dr. Gaynelle Arabian, on Promacta   Myocardial infarction [facility]) Mar 27, 2010   Pneumonia 1940's X 1; 27-Mar-2014 X 1   Rheumatoid arthritis ([place])    Shortness of breath    with exertion, has not been very active   Sleep apnea    Wears glasses    Wears partial dentures     Past Surgical History:  Procedure Laterality Date   APPENDECTOMY     BACK SURGERY     CATARACT EXTRACTION, BILATERAL     CHEST TUBE INSERTION Right 05/20/2019   Procedure: INSERTION PLEURAL DRAINAGE CATHETER;  Surgeon: Ivin Poot, MD;  Location: [place];  Service: Thoracic;  Laterality: Right;   COLONOSCOPY     CORONARY ANGIOPLASTY WITH STENT PLACEMENT     DES Lcx 10/2011, DES LAD 12/2011   CORONARY ARTERY BYPASS GRAFT  2010/03/27   "CABG X3"   CORONARY STENT INTERVENTION N/A 05/26/2019   Procedure: CORONARY STENT INTERVENTION;  Surgeon: Nelva Bush, MD;  Location: [place] CV [facility];  Service: Cardiovascular;  Laterality: N/A;   GASTROC RECESSION EXTREMITY Right 07/06/2015   Pasty Spillers (orthopedics- [place], [place])   HERNIA REPAIR     HIATAL HERNIA REPAIR     KNEE ARTHROSCOPY Left 06/22/2014   w/I&D   KNEE ARTHROSCOPY Left 06/22/2014   Procedure: IRRIGATION AND DEBRIDEMENT WITH CHRONDROPLASTY;  Surgeon: Hessie Dibble, MD;  Location: [place];  Service: Orthopedics;  Laterality: Left;   LEFT HEART CATH AND CORS/GRAFTS ANGIOGRAPHY N/A 05/26/2019   Procedure: LEFT HEART CATH AND CORS/GRAFTS ANGIOGRAPHY;  Surgeon: Nelva Bush, MD;  Location: [place] [facility];  Service: Cardiovascular;  Laterality: N/A;   LUMBAR DISC SURGERY  1960's?   MULTIPLE TOOTH EXTRACTIONS     PLEURAL BIOPSY Right 05/20/2019   Procedure: PLEURAL BIOPSY;  Surgeon: Ivin Poot, MD;  Location: [place];  Service: Thoracic;  Laterality: Right;    SHOULDER ARTHROSCOPY Right 04/09/2017   Procedure: ARTHROSCOPY SHOULDER;  Surgeon: Melrose Nakayama, MD;  Location: [place];  Service: Orthopedics;  Laterality: Right;   STERNAL CLOSURE     "wires from OHS taken out; plate put in" (03/28/8294)   SYNOVECTOMY Left 08/17/2014   Procedure: SYNOVECTOMY LEFT KNEE;  Surgeon: Hessie Dibble, MD;  Location: [place];  Service: Orthopedics;  Laterality: Left;   TOTAL ANKLE ARTHROPLASTY Right 07/06/2015   Pasty Spillers (orthopedics- [place])   TOTAL HIP ARTHROPLASTY Right 06/29/2018   Procedure: RIGHT TOTAL HIP ARTHROPLASTY ANTERIOR APPROACH;  Surgeon: Melrose Nakayama, MD;  Location: [facility];  Service: Orthopedics;  Laterality: Right;   TOTAL HIP ARTHROPLASTY Left 06/09/2016   Nicki Reaper Streater Claiborne Billings (orthopedics- [facility] [place])   TOTAL KNEE ARTHROPLASTY Left 11/24/2014   Procedure: TOTAL KNEE ARTHROPLASTY;  Surgeon: Hessie Dibble, MD;  Location: [place];  Service: Orthopedics;  Laterality: Left;   VIDEO ASSISTED THORACOSCOPY Right 05/20/2019   Procedure: VIDEO ASSISTED THORACOSCOPY;  Surgeon: Ivin Poot, MD;  Location: [facility] OR;  Service: Thoracic;  Laterality: Right;    Family History  Problem Relation Age of Onset   Heart disease Other  Arthritis Other    Heart disease Mother    Alzheimer's disease Father    Rheum arthritis Sister    Rheum arthritis Brother    Healthy Son     Social History Social History   Tobacco Use   Smoking status: Former Smoker    Packs/day: 1.00    Years: 15.00    Pack years: 15.00    Types: Cigarettes    Quit date: 12/22/1966    Years since quitting: 52.5   Smokeless tobacco: Former Systems developer    Types: Chew   Tobacco comment: "quit smoking ~ late ~ 60's; quit chewing in the 1970's"  Substance Use Topics   Alcohol use: No   Drug use: No    Allergies  Allergen Reactions   Lipitor [Atorvastatin] Other (See Comments)    muscle spasms cramps   Methylprednisolone Other (See Comments)    cramps cramps     Other     If patient is to receive blood - blood must be treated witgh radiation because his body will not accept a normal infusion    Sulfa Antibiotics Itching   Doxycycline Rash    Doxycycline caused itching redness on scalp and head Doxycycline caused itching redness on scalp and head    Current Outpatient Medications  Medication Sig Dispense Refill   albuterol (VENTOLIN HFA) 108 (90 Base) MCG/ACT inhaler Inhale 2 puffs into the lungs every 6 (six) hours as needed for wheezing or shortness of breath. 1 Inhaler 6   ALPRAZolam (XANAX) 0.25 MG tablet Take 1 tablet (0.25 mg total) by mouth 2 (two) times daily as needed for anxiety. 20 tablet 0   aspirin EC 81 MG tablet Take 1 tablet (81 mg total) by mouth daily. 30 tablet 2   carbidopa-levodopa (SINEMET IR) 25-100 MG tablet Take 1 tablet by mouth 3 (three) times daily. 270 tablet 1   Fluticasone-Salmeterol (ADVAIR) 100-50 MCG/DOSE AEPB Inhale 1 puff into the lungs daily.     folic acid (FOLVITE) 1 MG tablet Take 1 mg by mouth daily.     levothyroxine (SYNTHROID, LEVOTHROID) 200 MCG tablet Take 200 mcg by mouth at bedtime.      magnesium oxide (MAG-OX) 400 (241.3 Mg) MG tablet Take 1 tablet (400 mg total) by mouth 2 (two) times daily for 30 days. 60 tablet 0   methotrexate (RHEUMATREX) 2.5 MG tablet Take 15 mg by mouth once a week. Caution:Chemotherapy. Protect from light.     Omega-3 Fatty Acids (FISH OIL) 1200 MG CAPS Take 1,200 mg by mouth at bedtime. 360 MG OMEGA-3     omeprazole (PRILOSEC) 40 MG capsule Take 40 mg by mouth at bedtime.      predniSONE (DELTASONE) 10 MG tablet Take 10 mg by mouth daily with breakfast.     rOPINIRole (REQUIP) 0.5 MG tablet Take 0.5 mg by mouth at bedtime.     Spacer/Aero-Holding Chambers (AEROCHAMBER MV) inhaler Use as instructed 1 each 0   ticagrelor (BRILINTA) 90 MG TABS tablet Take 1 tablet (90 mg total) by mouth 2 (two) times daily. 60 tablet 2   Tiotropium Bromide Monohydrate  (SPIRIVA RESPIMAT) 1.25 MCG/ACT AERS Inhale 2 puffs into the lungs daily. 4 g 3   Vitamin D, Ergocalciferol, (DRISDOL) 1.25 MG (50000 UT) CAPS capsule Take 50,000 Units by mouth every 7 (seven) days.     No current facility-administered medications for this visit.     Review of Systems  Constitutional: positive for fatigue and weight loss Eyes: negative Ears, nose,  mouth, throat, and face: negative Respiratory: positive for cough and dyspnea on exertion Cardiovascular: negative Gastrointestinal: negative Genitourinary:negative Integument/breast: negative Hematologic/lymphatic: negative Musculoskeletal:positive for muscle weakness Neurological: negative Behavioral/Psych: negative Endocrine: negative Allergic/Immunologic: negative  Physical Exam  WEX:HBZJI, healthy, no distress, well nourished and well developed SKIN: skin color, texture, turgor are normal, no rashes or significant lesions HEAD: Normocephalic, No masses, lesions, tenderness or abnormalities EYES: normal, PERRLA, Conjunctiva are pink and non-injected EARS: External ears normal, Canals clear OROPHARYNX:no exudate, no erythema and lips, buccal mucosa, and tongue normal  NECK: supple, no adenopathy, no JVD LYMPH:  no palpable lymphadenopathy, no hepatosplenomegaly LUNGS: decreased breath sounds HEART: regular rate & rhythm, no murmurs and no gallops ABDOMEN:abdomen soft, non-tender, normal bowel sounds and no masses or organomegaly BACK: No CVA tenderness, Range of motion is normal EXTREMITIES:no joint deformities, effusion, or inflammation, no edema  NEURO: alert & oriented x 3 with fluent speech, no focal motor/sensory deficits  PERFORMANCE STATUS: ECOG 1  LABORATORY DATA: Lab Results  Component Value Date   WBC 9.1 06/16/2019   HGB 13.0 06/16/2019   HCT 41.0 06/16/2019   MCV 96.2 06/16/2019   PLT 265 06/16/2019      Chemistry      Component Value Date/Time   NA 137 05/28/2019 0238   K 3.7  05/28/2019 0238   CL 101 05/28/2019 0238   CO2 25 05/28/2019 0238   BUN 10 05/28/2019 0238   CREATININE 1.15 05/28/2019 0238   CREATININE 1.79 (H) 10/12/2014 1136      Component Value Date/Time   CALCIUM 9.1 05/28/2019 0238   ALKPHOS 109 05/28/2019 0238   AST 21 05/28/2019 0238   ALT 8 05/28/2019 0238   BILITOT 0.6 05/28/2019 0238       RADIOGRAPHIC STUDIES: Dg Chest 2 View  Result Date: 06/13/2019 CLINICAL DATA:  Shortness of breath. History of right pleural effusion and status post prior PleurX catheter placement. EXAM: CHEST - 2 VIEW COMPARISON:  05/31/2019 FINDINGS: Stable heart size and tortuosity of the thoracic aorta. Stable positioning of tunneled right-sided PleurX catheter with no new significant right pleural fluid volume present. There may be a small amount of pleural fluid adjacent to the insertion site of the catheter. No pneumothorax present. There is some atelectasis at the right lung base. No pulmonary edema or focal airspace consolidation. IMPRESSION: Stable positioning of PleurX catheter with no significant right pleural fluid volume present. There may be a small amount of right pleural fluid. Right basilar atelectasis present. Electronically Signed   By: Aletta Edouard M.D.   On: 06/13/2019 14:47   Dg Chest 2 View  Result Date: 05/31/2019 CLINICAL DATA:  Shortness of breath.  Recent right VATS. EXAM: CHEST - 2 VIEW COMPARISON:  Chest x-ray dated May 28, 2019. FINDINGS: Stable cardiomegaly status post CABG. Normal mediastinal contours. Normal pulmonary vascularity. Right greater than left basilar atelectasis/scarring, similar to prior study. Unchanged small right pleural effusion. No pneumothorax. No acute osseous abnormality. IMPRESSION: 1. Unchanged small right pleural effusion and bibasilar atelectasis/scarring. Electronically Signed   By: Titus Dubin M.D.   On: 05/31/2019 13:40   Dg Chest 2 View  Result Date: 05/28/2019 CLINICAL DATA:  Chest tube in place. EXAM:  CHEST - 2 VIEW COMPARISON:  Chest radiograph 05/26/2019 FINDINGS: Monitoring leads overlie the patient. Stable cardiomegaly. Tortuosity of the thoracic aorta. Right-greater-than-left basilar heterogeneous opacities. Small bilateral pleural effusions. Osseous structures unremarkable. IMPRESSION: Stable bibasilar opacities favored represent atelectasis with small bilateral pleural effusions, right-greater-than-left. Electronically Signed  By: Lovey Newcomer M.D.   On: 05/28/2019 08:16   Ct Angio Chest Pe W Or Wo Contrast  Result Date: 05/23/2019 CLINICAL DATA:  Tachycardia.  Back pain. EXAM: CT ANGIOGRAPHY CHEST WITH CONTRAST TECHNIQUE: Multidetector CT imaging of the chest was performed using the standard protocol during bolus administration of intravenous contrast. Multiplanar CT image reconstructions and MIPs were obtained to evaluate the vascular anatomy. CONTRAST:  72mL OMNIPAQUE IOHEXOL 350 MG/ML SOLN COMPARISON:  CT dated 05/14/2019 FINDINGS: Cardiovascular: There is no PE identified. There are coronary artery calcifications. Atherosclerotic changes are noted of the thoracic aorta. There is no large pericardial effusion. Aortic calcifications are noted. The thoracic aorta measures up to 4.2 cm. Mediastinum/Nodes: No enlarged mediastinal, hilar, or axillary lymph nodes. Thyroid gland, trachea, and esophagus demonstrate no significant findings. Lungs/Pleura: There is been significant interval decrease in size of the previously noted right-sided pleural effusion. There is a small residual loculated right-sided hydropneumothorax. There is atelectasis involving the right lung base. Calcified pleural based plaques are again noted. There is atelectasis at the left lung base with a few calcified pleural based plaques. A right-sided chest tube is in place. Upper Abdomen: No acute abnormality. Musculoskeletal: No chest wall abnormality. No acute or significant osseous findings. Review of the MIP images confirms the  above findings. IMPRESSION: 1. No PE. 2. Significant interval decrease in size of the previously demonstrated right-sided pleural effusion. A right-sided chest tube is in place with a small residual right-sided hydropneumothorax. 3. Again identified are calcified pleural based plaques bilaterally. 4. Additional chronic findings as previously described. Aortic Atherosclerosis (ICD10-I70.0). Electronically Signed   By: Constance Holster M.D.   On: 05/23/2019 21:31   Dg Chest Port 1 View  Result Date: 05/26/2019 CLINICAL DATA:  Follow-up VATS EXAM: PORTABLE CHEST 1 VIEW COMPARISON:  05/25/2019 FINDINGS: Cardiac shadow is stable. Postsurgical changes are again noted. Small right pleural effusion and right basilar atelectasis are noted. PleurX catheter is noted on the right and stable. IMPRESSION: Stable right pleural effusion and basilar atelectasis. PleurX catheter is stable in appearance. Electronically Signed   By: Inez Catalina M.D.   On: 05/26/2019 07:54   Dg Chest Port 1 View  Result Date: 05/25/2019 CLINICAL DATA:  Recent VATS EXAM: PORTABLE CHEST 1 VIEW COMPARISON:  05/24/2019 FINDINGS: Cardiac shadow is stable. Postsurgical changes are again seen. Small right pleural effusion is again noted and stable. Mild right basilar atelectasis is noted. No bony abnormality is noted. IMPRESSION: Small right pleural effusion and right basilar atelectasis. Electronically Signed   By: Inez Catalina M.D.   On: 05/25/2019 08:49   Dg Chest Port 1 View  Result Date: 05/24/2019 CLINICAL DATA:  Status post chest tube placement EXAM: PORTABLE CHEST 1 VIEW COMPARISON:  05/23/2019 FINDINGS: Cardiac shadow is enlarged but stable. Postsurgical changes are noted. Right-sided pleural effusion is again seen with small PleurX catheter noted in place. No pneumothorax is seen. Left lung is clear. IMPRESSION: Right-sided pleural effusion stable from the prior exam. No new focal abnormality is seen. Electronically Signed   By: Inez Catalina M.D.   On: 05/24/2019 07:17   Dg Chest Port 1 View  Result Date: 05/23/2019 CLINICAL DATA:  Chest tube removal. EXAM: PORTABLE CHEST 1 VIEW COMPARISON:  Radiograph of same day. FINDINGS: Stable cardiomediastinal silhouette. Right-sided chest tube is unchanged in position. No pneumothorax is noted. Left lung is clear. Stable right basilar atelectasis is noted with probable minimal pleural effusion. IMPRESSION: Stable position of right-sided chest tube  is noted. No pneumothorax is noted. Stable right basilar atelectasis is noted. Electronically Signed   By: Marijo Conception M.D.   On: 05/23/2019 18:44   Dg Chest Port 1 View  Result Date: 05/23/2019 CLINICAL DATA:  85 year old male status post right side VATS with talc pleurodesis postoperative day 3. EXAM: PORTABLE CHEST 1 VIEW COMPARISON:  05/22/2019 and earlier. FINDINGS: Portable AP semi upright view at 0530 hours. Stable right chest tubes. No pneumothorax identified. Stable lung volumes and ventilation with patchy right lung base opacity. Stable cardiac size and mediastinal contours. Prior CABG. Paucity of bowel gas in the upper abdomen. No acute osseous abnormality identified. IMPRESSION: Stable chest tubes and postoperative appearance of the right chest. No pneumothorax . Electronically Signed   By: Genevie Ann M.D.   On: 05/23/2019 06:35   Dg Chest Port 1 View  Result Date: 05/22/2019 CLINICAL DATA:  Chest tube in place EXAM: PORTABLE CHEST 1 VIEW COMPARISON:  05/20/2019 FINDINGS: Interval extubation. Medial right chest tube.  No pneumothorax is seen. Mild right basilar opacity, likely a combination of mildly complex pleural effusion and atelectasis. Left lung is clear. The heart is normal in size. Postsurgical changes related to prior CABG. Median sternotomy. IMPRESSION: Interval extubation. Stable right chest tube.  No pneumothorax is seen. Stable right basilar opacity, likely combination of atelectasis and complex pleural effusion. Electronically  Signed   By: Julian Hy M.D.   On: 05/22/2019 06:46   Dg Chest Portable 1 View  Result Date: 05/20/2019 CLINICAL DATA:  VATS. EXAM: PORTABLE CHEST 1 VIEW COMPARISON:  05/16/2019. FINDINGS: Endotracheal tube tip approximately 1.9 cm above the carina. Right chest tube noted medial right chest. Tiny right base pleural air collection noted. This is most likely related to fluid evacuation and follow-up exam is suggested. No prominent pneumothorax noted. Bibasilar atelectasis/infiltrates. Small left pleural effusion. Prior CABG. Stable cardiomegaly. IMPRESSION: 1.  Endotracheal tube tip approximately 1.9 cm above the carina. 2. Right chest tube noted over the medial right chest with evacuation of the previously identified right pleural effusion. Tiny collection of right base pleural air is noted, this is most likely related to fluid evacuation and a follow-up exam is suggested. No prominent pneumothorax noted. 3.  Bibasilar atelectasis/infiltrates.  Small left pleural effusion. These results will be called to the ordering clinician or representative by the Radiologist Assistant, and communication documented in the PACS or zVision Dashboard. Electronically Signed   By: Marcello Moores  Register   On: 05/20/2019 10:05   Vas [place] Lower Extremity Venous (dvt)  Result Date: 05/20/2019  Lower Venous Study Indications: Swelling, and Pre-op.  Performing Technologist: Maudry Mayhew MHA, RDMS, RVT, RDCS  Examination Guidelines: A complete evaluation includes B-mode imaging, spectral Doppler, color Doppler, and power Doppler as needed of all accessible portions of each vessel. Bilateral testing is considered an integral part of a complete examination. Limited examinations for reoccurring indications may be performed as noted.  +---------+---------------+---------+-----------+----------+--------------+  RIGHT     Compressibility Phasicity Spontaneity Properties Summary          +---------+---------------+---------+-----------+----------+--------------+  CFV       Full            Yes       Yes                                    +---------+---------------+---------+-----------+----------+--------------+  SFJ       Full                                                             +---------+---------------+---------+-----------+----------+--------------+  FV Prox   Full                                                             +---------+---------------+---------+-----------+----------+--------------+  FV Mid    Full                                                             +---------+---------------+---------+-----------+----------+--------------+  FV Distal Full                                                             +---------+---------------+---------+-----------+----------+--------------+  PFV       Full                                                             +---------+---------------+---------+-----------+----------+--------------+  POP       Full            Yes       Yes                                    +---------+---------------+---------+-----------+----------+--------------+  PTV       Full                                                             +---------+---------------+---------+-----------+----------+--------------+  PERO                                                       Not visualized  +---------+---------------+---------+-----------+----------+--------------+   +---------+---------------+---------+-----------+----------+-------+  LEFT      Compressibility Phasicity Spontaneity Properties Summary  +---------+---------------+---------+-----------+----------+-------+  CFV       Full            Yes       Yes                             +---------+---------------+---------+-----------+----------+-------+  SFJ       Full                                                      +---------+---------------+---------+-----------+----------+-------+  FV Prox   Full                                                       +---------+---------------+---------+-----------+----------+-------+  FV Mid    Full                                                      +---------+---------------+---------+-----------+----------+-------+  FV Distal Full                                                      +---------+---------------+---------+-----------+----------+-------+  PFV       Full                                                      +---------+---------------+---------+-----------+----------+-------+  POP       Full            Yes       Yes                             +---------+---------------+---------+-----------+----------+-------+  PTV       Full                                                      +---------+---------------+---------+-----------+----------+-------+  PERO      Full                                                      +---------+---------------+---------+-----------+----------+-------+   Summary: Right: There is no evidence of deep vein thrombosis in the lower extremity. However, portions of this examination were limited- see technologist comments above. No cystic structure found in the popliteal fossa. Left: There is no evidence of deep vein thrombosis in the lower extremity. No cystic structure found in the popliteal fossa.  *See table(s) above for measurements and observations. Electronically signed by Monica Martinez MD on 05/20/2019 at 5:22:14 PM.    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   PLAN: I had a lengthy discussion with the patient and his wife who was available by phone today about his current disease stage, prognosis and treatment options. I personally and independently reviewed the scans and discussed the result with the patient and  his wife today. I recommended for the patient to complete the staging work-up  by ordering a PET scan to rule out any other metastatic disease. I discussed with the patient his treatment options including palliative care and hospice referral versus consideration of palliative systemic chemotherapy with carboplatin and Alimta.  The patient is not a great candidate for treatment with cisplatin because of his comorbidities and age.  I will also avoid the Avastin at this point because of his recent cardiac disease. The patient and his wife are interested in proceeding with treatment.  He will be treated with carboplatin for AUC of 5 and Alimta 500 mg/M2 every 3 weeks.  I discussed with the patient the adverse effect of this treatment including but not limited to alopecia, myelosuppression, nausea and vomiting, peripheral neuropathy, liver or renal dysfunction. I will arrange for the patient to receive vitamin B12 injection today. I will call his pharmacy with prescription for folic acid 1 mg p.o. daily, Compazine 10 mg p.o. every 6 hours as needed for nausea and Decadron 4 mg p.o. twice daily the day before, day of and day after chemotherapy. I will refer the patient back to [name] for consideration of Port-A-Cath placement and I will send a prescription of EMLA cream to his pharmacy. The patient will come back for follow-up visit 1 week after the first dose of his treatment for evaluation and management of any adverse effect of his treatment. I will also arrange for the patient to have a chemotherapy education class before the first dose of his treatment. The patient was advised to call immediately if he has any concerning symptoms in the interval.  The patient voices understanding of current disease status and treatment options and is in agreement with the current care plan.  All questions were answered. The patient knows to call the clinic with any problems, questions or concerns. We can certainly  see the patient much sooner if necessary.  Thank you so much for allowing me to participate in the care of Devante W Longino. I will continue to follow up the patient with you and assist in his care.  I spent 55 minutes counseling the patient face to face. The total time spent in the appointment was 80 minutes.  Disclaimer: This note was dictated with voice recognition software. Similar sounding words can inadvertently be transcribed and may not be corrected upon review.   Eilleen Kempf June 16, 2019, 2:34 PM

## 2019-06-17 ENCOUNTER — Encounter: Payer: Self-pay | Admitting: Medical Oncology

## 2019-06-17 ENCOUNTER — Ambulatory Visit: Payer: Medicare Other | Admitting: Cardiothoracic Surgery

## 2019-06-17 ENCOUNTER — Telehealth: Payer: Self-pay | Admitting: Internal Medicine

## 2019-06-17 LAB — FUNGUS CULTURE RESULT

## 2019-06-17 LAB — FUNGUS CULTURE WITH STAIN

## 2019-06-17 LAB — FUNGAL ORGANISM REFLEX

## 2019-06-17 NOTE — Telephone Encounter (Signed)
Scheduled appt per 6/25 los - pt wife is aware of appt date and time the next set of appts . And will get an updated schedule in office on 7/7

## 2019-06-20 ENCOUNTER — Telehealth: Payer: Self-pay | Admitting: *Deleted

## 2019-06-20 ENCOUNTER — Other Ambulatory Visit: Payer: Self-pay | Admitting: Internal Medicine

## 2019-06-20 NOTE — Telephone Encounter (Signed)
Chemo orders are in to start 7/7

## 2019-06-20 NOTE — Telephone Encounter (Signed)
Call from Pt wife who states " Ignace said yes to treatment."  Returned call to pt and wife, who advised he needs a PAC and a PET scan. Discussed with wife to be sure to pick up pt rx at pharmacy, expect a call from scheduling regarding PAC and PET Scan appt. If she does not get a call w/appt by friday to call back to the office. Wife verbalized understanding. No further concerns.  Message routed to MD for review.

## 2019-06-21 ENCOUNTER — Telehealth: Payer: Self-pay | Admitting: *Deleted

## 2019-06-21 ENCOUNTER — Inpatient Hospital Stay: Payer: Medicare Other

## 2019-06-21 LAB — FUNGUS CULTURE WITH STAIN

## 2019-06-21 LAB — FUNGUS CULTURE RESULT

## 2019-06-21 LAB — FUNGAL ORGANISM REFLEX

## 2019-06-21 MED ORDER — LIDOCAINE-PRILOCAINE 2.5-2.5 % EX CREA: 1.0000 "application " | TOPICAL_CREAM | CUTANEOUS | 0 refills | Status: DC | PRN

## 2019-06-22 ENCOUNTER — Encounter: Payer: Self-pay | Admitting: *Deleted

## 2019-06-22 ENCOUNTER — Telehealth: Payer: Self-pay | Admitting: *Deleted

## 2019-06-22 DIAGNOSIS — C450 Mesothelioma of pleura: Secondary | ICD-10-CM

## 2019-06-22 NOTE — Telephone Encounter (Signed)
Oncology Nurse Navigator Documentation  Oncology Nurse Navigator Flowsheets 06/22/2019  Navigator Location [facility]  Referral Date to RadOnc/MedOnc -  Navigator Encounter Type Telephone/I spoke with Dr. Julien Nordmann about Mr. Hoopes getting port placement. He was ok.  I called to update patient and order is placed.  I was unable to reach but did leave a vm message with my name and phone number to call.   Telephone Outgoing Call  Treatment Phase Pre-Tx/Tx Discussion  Barriers/Navigation Needs Coordination of Care;Education  Education Other  Interventions Coordination of Care;Education  Coordination of Care Other  Education Method Verbal  Acuity Level 2  Time Spent with Patient 30

## 2019-06-22 NOTE — Progress Notes (Signed)
Oncology Nurse Navigator Documentation  Oncology Nurse Navigator Flowsheets 06/22/2019  Navigator Location [facility]  Referral Date to RadOnc/MedOnc -  Navigator Encounter Type Other/per Dr. Julien Nordmann port placement ordered.   Barriers/Navigation Needs Coordination of Care  Interventions Coordination of Care  Coordination of Care Other  Acuity Level 3  Time Spent with Patient 60

## 2019-06-23 ENCOUNTER — Telehealth: Payer: Self-pay | Admitting: *Deleted

## 2019-06-23 ENCOUNTER — Encounter (HOSPITAL_COMMUNITY): Payer: Medicare Other

## 2019-06-23 NOTE — Telephone Encounter (Signed)
Spoke with Pt wife [name] to discuss pt concerns.  Pt got on phone verbalized he is NOT starting treatment until he has a PAC placed. PAC has been placed 7/1 Message to scheduling to cancel 7/7 appt per pt request as PAC has not been scheduled.  Reviewed with [name] upcoming appt for PET scan is on 7/9. Discussed with [name] to expect a call from scheduling about PAC placement.

## 2019-06-27 ENCOUNTER — Telehealth: Payer: Self-pay | Admitting: *Deleted

## 2019-06-27 NOTE — Telephone Encounter (Signed)
Oncology Nurse Navigator Documentation  Oncology Nurse Navigator Flowsheets 06/27/2019  Navigator Location [facility]  Referral Date to RadOnc/MedOnc -  Navigator Encounter Type Telephone/I followed up with Dr. Julien Nordmann on Jeffrey [name]'s schedule.  Patient needs a port placed.  I called Dr. Thayer Ohm office and they updated me that IR will place port.  I contacted central scheduling and then transferred to IR.  I spoke with Tiffany and she states she left him a vm message on 7/1 to call for the appt.  I called the patient and spoke with the wife.  I gave her the phone number to call for the appt.   Telephone Outgoing Call  Treatment Phase Pre-Tx/Tx Discussion  Barriers/Navigation Needs Coordination of Care;Education  Education Other  Interventions Coordination of Care;Education  Coordination of Care Other  Education Method Verbal  Acuity Level 3  Time Spent with Patient 59

## 2019-06-28 ENCOUNTER — Inpatient Hospital Stay: Payer: Medicare Other

## 2019-06-28 ENCOUNTER — Other Ambulatory Visit: Payer: Self-pay | Admitting: Radiology

## 2019-06-28 ENCOUNTER — Encounter: Payer: Self-pay | Admitting: *Deleted

## 2019-06-28 ENCOUNTER — Telehealth: Payer: Self-pay | Admitting: *Deleted

## 2019-06-28 LAB — ACID FAST CULTURE WITH REFLEXED SENSITIVITIES (MYCOBACTERIA): Acid Fast Culture: NEGATIVE

## 2019-06-28 NOTE — Telephone Encounter (Signed)
Oncology Nurse Navigator Documentation  Oncology Nurse Navigator Flowsheets 06/28/2019  Navigator Location [facility]  Referral Date to RadOnc/MedOnc -  Navigator Encounter Type Telephone/I called and spoke to Mr. Bayle on his mobile phone.  I updated him on appt time for his chemo.  I then called his home phone to clarify the appt time of 7/10 at 8:15.    Telephone Education;Outgoing Call  Abnormal Finding Date 05/14/2019  Confirmed Diagnosis Date 05/20/2019  Treatment Initiated Date 07/01/2019  Treatment Phase Pre-Tx/Tx Discussion  Barriers/Navigation Needs Coordination of Care;Education  Education Other  Interventions Coordination of Care;Education  Coordination of Care Appts;Chemo  Education Method Verbal  Acuity Level 2  Time Spent with Patient 30

## 2019-06-28 NOTE — Progress Notes (Signed)
Oncology Nurse Navigator Documentation  Oncology Nurse Navigator Flowsheets 06/28/2019  Navigator Location [facility]  Referral Date to RadOnc/MedOnc -  Navigator Encounter Type Other/I followed up on Jeffrey [name] schedule.  He is having his port placed tomorrow with IR.  I updated Dr. Julien Nordmann and he would like patient to get treatment either Thursday or Friday of this week.  I contacted [facility] of infusion to see if they can accommodate.   Telephone -  Treatment Phase Pre-Tx/Tx Discussion  Barriers/Navigation Needs Coordination of Care  Education -  Interventions Coordination of Care  Coordination of Care Other  Education Method -  Acuity Level 2  Time Spent with Patient 30

## 2019-06-29 ENCOUNTER — Other Ambulatory Visit: Payer: Self-pay | Admitting: Internal Medicine

## 2019-06-29 ENCOUNTER — Ambulatory Visit: Payer: Medicare Other | Admitting: Primary Care

## 2019-06-29 ENCOUNTER — Ambulatory Visit (HOSPITAL_COMMUNITY)
Admission: RE | Admit: 2019-06-29 | Discharge: 2019-06-29 | Disposition: A | Discharge status: Home / Self Care | Payer: Medicare Other | Source: Ambulatory Visit | Attending: Internal Medicine | Admitting: Internal Medicine

## 2019-06-29 ENCOUNTER — Encounter (HOSPITAL_COMMUNITY): Payer: Self-pay | Admitting: Interventional Radiology

## 2019-06-29 ENCOUNTER — Other Ambulatory Visit: Payer: Self-pay

## 2019-06-29 DIAGNOSIS — I10 Essential (primary) hypertension: Secondary | ICD-10-CM | POA: Insufficient documentation

## 2019-06-29 DIAGNOSIS — Z885 Allergy status to narcotic agent status: Secondary | ICD-10-CM | POA: Insufficient documentation

## 2019-06-29 DIAGNOSIS — Z881 Allergy status to other antibiotic agents status: Secondary | ICD-10-CM | POA: Insufficient documentation

## 2019-06-29 DIAGNOSIS — K219 Gastro-esophageal reflux disease without esophagitis: Secondary | ICD-10-CM | POA: Insufficient documentation

## 2019-06-29 DIAGNOSIS — M1990 Unspecified osteoarthritis, unspecified site: Secondary | ICD-10-CM | POA: Diagnosis not present

## 2019-06-29 DIAGNOSIS — Z79890 Hormone replacement therapy (postmenopausal): Secondary | ICD-10-CM | POA: Insufficient documentation

## 2019-06-29 DIAGNOSIS — Z7982 Long term (current) use of aspirin: Secondary | ICD-10-CM | POA: Diagnosis not present

## 2019-06-29 DIAGNOSIS — M069 Rheumatoid arthritis, unspecified: Secondary | ICD-10-CM | POA: Diagnosis not present

## 2019-06-29 DIAGNOSIS — Z882 Allergy status to sulfonamides status: Secondary | ICD-10-CM | POA: Insufficient documentation

## 2019-06-29 DIAGNOSIS — Z96652 Presence of left artificial knee joint: Secondary | ICD-10-CM | POA: Diagnosis not present

## 2019-06-29 DIAGNOSIS — E7800 Pure hypercholesterolemia, unspecified: Secondary | ICD-10-CM | POA: Diagnosis not present

## 2019-06-29 DIAGNOSIS — Z955 Presence of coronary angioplasty implant and graft: Secondary | ICD-10-CM | POA: Diagnosis not present

## 2019-06-29 DIAGNOSIS — C450 Mesothelioma of pleura: Secondary | ICD-10-CM

## 2019-06-29 DIAGNOSIS — Z96643 Presence of artificial hip joint, bilateral: Secondary | ICD-10-CM | POA: Diagnosis not present

## 2019-06-29 DIAGNOSIS — G4730 Sleep apnea, unspecified: Secondary | ICD-10-CM | POA: Diagnosis not present

## 2019-06-29 DIAGNOSIS — I2510 Atherosclerotic heart disease of native coronary artery without angina pectoris: Secondary | ICD-10-CM | POA: Insufficient documentation

## 2019-06-29 DIAGNOSIS — E039 Hypothyroidism, unspecified: Secondary | ICD-10-CM | POA: Diagnosis not present

## 2019-06-29 DIAGNOSIS — Z8261 Family history of arthritis: Secondary | ICD-10-CM | POA: Insufficient documentation

## 2019-06-29 DIAGNOSIS — Z888 Allergy status to other drugs, medicaments and biological substances status: Secondary | ICD-10-CM | POA: Diagnosis not present

## 2019-06-29 DIAGNOSIS — Z8249 Family history of ischemic heart disease and other diseases of the circulatory system: Secondary | ICD-10-CM | POA: Diagnosis not present

## 2019-06-29 DIAGNOSIS — I255 Ischemic cardiomyopathy: Secondary | ICD-10-CM | POA: Diagnosis not present

## 2019-06-29 DIAGNOSIS — Z87891 Personal history of nicotine dependence: Secondary | ICD-10-CM | POA: Insufficient documentation

## 2019-06-29 DIAGNOSIS — Z951 Presence of aortocoronary bypass graft: Secondary | ICD-10-CM | POA: Insufficient documentation

## 2019-06-29 DIAGNOSIS — Z79899 Other long term (current) drug therapy: Secondary | ICD-10-CM | POA: Diagnosis not present

## 2019-06-29 DIAGNOSIS — I252 Old myocardial infarction: Secondary | ICD-10-CM | POA: Insufficient documentation

## 2019-06-29 HISTORY — PX: IR IMAGING GUIDED PORT INSERTION: IMG5740

## 2019-06-29 LAB — BASIC METABOLIC PANEL
Anion gap: 12 (ref 5–15)
BUN: 21 mg/dL (ref 8–23)
CO2: 23 mmol/L (ref 22–32)
Calcium: 9.3 mg/dL (ref 8.9–10.3)
Chloride: 105 mmol/L (ref 98–111)
Creatinine, Ser: 1.09 mg/dL (ref 0.61–1.24)
GFR calc Af Amer: >60 mL/min (ref >60)
GFR calc non Af Amer: >60 mL/min (ref >60)
Glucose, Bld: 113 mg/dL — ABNORMAL HIGH (ref 70–99)
Potassium: 4.4 mmol/L (ref 3.5–5.1)
Sodium: 140 mmol/L (ref 135–145)

## 2019-06-29 LAB — CBC WITH DIFFERENTIAL/PLATELET
Abs Immature Granulocytes: 0.04 10*3/uL (ref 0.00–0.07)
Basophils Absolute: 0 10*3/uL (ref 0.0–0.1)
Basophils Relative: 0 %
Eosinophils Absolute: 0.1 10*3/uL (ref 0.0–0.5)
Eosinophils Relative: 1 %
HCT: 39.1 % (ref 39.0–52.0)
Hemoglobin: 12.6 g/dL — ABNORMAL LOW (ref 13.0–17.0)
Immature Granulocytes: 0 %
Lymphocytes Relative: 9 %
Lymphs Abs: 1 10*3/uL (ref 0.7–4.0)
MCH: 31 pg (ref 26.0–34.0)
MCHC: 32.2 g/dL (ref 30.0–36.0)
MCV: 96.3 fL (ref 80.0–100.0)
Monocytes Absolute: 0.8 10*3/uL (ref 0.1–1.0)
Monocytes Relative: 7 %
Neutro Abs: 9.2 10*3/uL — ABNORMAL HIGH (ref 1.7–7.7)
Neutrophils Relative %: 83 %
Platelets: 289 10*3/uL (ref 150–400)
RBC: 4.06 MIL/uL — ABNORMAL LOW (ref 4.22–5.81)
RDW: 14.9 % (ref 11.5–15.5)
WBC: 11.2 10*3/uL — ABNORMAL HIGH (ref 4.0–10.5)
nRBC: 0 % (ref 0.0–0.2)

## 2019-06-29 LAB — PROTIME-INR
INR: 1 (ref 0.8–1.2)
Prothrombin Time: 12.7 seconds (ref 11.4–15.2)

## 2019-06-29 MED ORDER — LIDOCAINE-EPINEPHRINE 1 %-1:100000 IJ SOLN
INTRAMUSCULAR | Status: AC
  Filled 2019-06-29: qty 1

## 2019-06-29 MED ORDER — FENTANYL CITRATE (PF) 100 MCG/2ML IJ SOLN
INTRAMUSCULAR | Status: AC | PRN
  Administered 2019-06-29 (×2): 25 ug via INTRAVENOUS
  Administered 2019-06-29: 50 ug via INTRAVENOUS

## 2019-06-29 MED ORDER — LIDOCAINE HCL (PF) 1 % IJ SOLN
INTRAMUSCULAR | Status: AC | PRN
  Administered 2019-06-29: 5 mL

## 2019-06-29 MED ORDER — MIDAZOLAM HCL 2 MG/2ML IJ SOLN
INTRAMUSCULAR | Status: AC | PRN
  Administered 2019-06-29: 1 mg via INTRAVENOUS
  Administered 2019-06-29 (×2): 0.5 mg via INTRAVENOUS

## 2019-06-29 MED ORDER — MIDAZOLAM HCL 2 MG/2ML IJ SOLN
INTRAMUSCULAR | Status: AC
  Filled 2019-06-29: qty 2

## 2019-06-29 MED ORDER — CEFAZOLIN SODIUM-DEXTROSE 2-4 GM/100ML-% IV SOLN
INTRAVENOUS | Status: AC
  Administered 2019-06-29: 15:00:00 2 g via INTRAVENOUS
  Filled 2019-06-29: qty 100

## 2019-06-29 MED ORDER — HEPARIN SOD (PORK) LOCK FLUSH 100 UNIT/ML IV SOLN
INTRAVENOUS | Status: AC
  Filled 2019-06-29: qty 5

## 2019-06-29 MED ORDER — CEFAZOLIN SODIUM-DEXTROSE 2-4 GM/100ML-% IV SOLN
2.0000 g | INTRAVENOUS | Status: AC
  Administered 2019-06-29: 15:00:00 2 g via INTRAVENOUS

## 2019-06-29 MED ORDER — FENTANYL CITRATE (PF) 100 MCG/2ML IJ SOLN
INTRAMUSCULAR | Status: AC
  Filled 2019-06-29: qty 2

## 2019-06-29 MED ORDER — HEPARIN SOD (PORK) LOCK FLUSH 100 UNIT/ML IV SOLN
INTRAVENOUS | Status: AC | PRN
  Administered 2019-06-29: 500 [IU] via INTRAVENOUS

## 2019-06-29 MED ORDER — LIDOCAINE-EPINEPHRINE 2 %-1:100000 IJ SOLN
INTRAMUSCULAR | Status: AC | PRN
  Administered 2019-06-29: 10 mL

## 2019-06-29 MED ORDER — SODIUM CHLORIDE 0.9 % IV SOLN
INTRAVENOUS | Status: DC
  Administered 2019-06-29: 14:00:00 via INTRAVENOUS

## 2019-06-29 NOTE — Discharge Instructions (Signed)
Implanted [facility] Guide An implanted port is a device that is placed under the skin. It is usually placed in the chest. The device can be used to give IV medicine, to take blood, or for dialysis. You may have an implanted port if:  You need IV medicine that would be irritating to the small veins in your hands or arms.  You need IV medicines, such as antibiotics, for a long period of time.  You need IV nutrition for a long period of time.  You need dialysis. Having a port means that your health care provider will not need to use the veins in your arms for these procedures. You may have fewer limitations when using a port than you would if you used other types of long-term IVs, and you will likely be able to return to normal activities after your incision heals. An implanted port has two main parts:  Reservoir. The reservoir is the part where a needle is inserted to give medicines or draw blood. The reservoir is round. After it is placed, it appears as a small, raised area under your skin.  Catheter. The catheter is a thin, flexible tube that connects the reservoir to a vein. Medicine that is inserted into the reservoir goes into the catheter and then into the vein. How is my port accessed? To access your port:  A numbing cream may be placed on the skin over the port site.  Your health care provider will put on a mask and sterile gloves.  The skin over your port will be cleaned carefully with a germ-killing soap and allowed to dry.  Your health care provider will gently pinch the port and insert a needle into it.  Your health care provider will check for a blood return to make sure the port is in the vein and is not clogged.  If your port needs to remain accessed to get medicine continuously (constant infusion), your health care provider will place a clear bandage (dressing) over the needle site. The dressing and needle will need to be changed every week, or as told by your health  care provider. What is flushing? Flushing helps keep the port from getting clogged. Follow instructions from your health care provider about how and when to flush the port. Ports are usually flushed with saline solution or a medicine called heparin. The need for flushing will depend on how the port is used:  If the port is only used from time to time to give medicines or draw blood, the port may need to be flushed: ? Before and after medicines have been given. ? Before and after blood has been drawn. ? As part of routine maintenance. Flushing may be recommended every 4-6 weeks.  If a constant infusion is running, the port may not need to be flushed.  Throw away any syringes in a disposal container that is meant for sharp items (sharps container). You can buy a sharps container from a pharmacy, or you can make one by using an empty hard plastic bottle with a cover. How long will my port stay implanted? The port can stay in for as long as your health care provider thinks it is needed. When it is time for the port to come out, a surgery will be done to remove it. The surgery will be similar to the procedure that was done to put the port in. Follow these instructions at home:   Flush your port as told by your health care  provider.  If you need an infusion over several days, follow instructions from your health care provider about how to take care of your port site. Make sure you: ? Wash your hands with soap and water before you change your dressing. If soap and water are not available, use alcohol-based hand sanitizer. ? Change your dressing as told by your health care provider. ? Place any used dressings or infusion bags into a plastic bag. Throw that bag in the trash. ? Keep the dressing that covers the needle clean and dry. Do not get it wet. ? Do not use scissors or sharp objects near the tube. ? Keep the tube clamped, unless it is being used.  Check your port site every day for signs of  infection. Check for: ? Redness, swelling, or pain. ? Fluid or blood. ? Pus or a bad smell.  Protect the skin around the port site. ? Avoid wearing bra straps that rub or irritate the site. ? Protect the skin around your port from seat belts. Place a soft pad over your chest if needed.  Bathe or shower as told by your health care provider. The site may get wet as long as you are not actively receiving an infusion.  Return to your normal activities as told by your health care provider. Ask your health care provider what activities are safe for you.  Carry a medical alert card or wear a medical alert bracelet at all times. This will let health care providers know that you have an implanted port in case of an emergency. Get help right away if:  You have redness, swelling, or pain at the port site.  You have fluid or blood coming from your port site.  You have pus or a bad smell coming from the port site.  You have a fever. Summary  Implanted ports are usually placed in the chest for long-term IV access.  Follow instructions from your health care provider about flushing the port and changing bandages (dressings).  Take care of the area around your port by avoiding clothing that puts pressure on the area, and by watching for signs of infection.  Protect the skin around your port from seat belts. Place a soft pad over your chest if needed.  Get help right away if you have a fever or you have redness, swelling, pain, drainage, or a bad smell at the port site. This information is not intended to replace advice given to you by your health care provider. Make sure you discuss any questions you have with your health care provider. Document Released: 12/08/2005 Document Revised: 04/01/2019 Document Reviewed: 01/10/2017 Elsevier Patient Education  [place] Insertion, Care After This sheet gives you information about how to care for yourself after your  procedure. Your health care provider may also give you more specific instructions. If you have problems or questions, contact your health care provider. What can I expect after the procedure? After the procedure, it is common to have:  Discomfort at the port insertion site.  Bruising on the skin over the port. This should improve over 3-4 days. Follow these instructions at home: [facility] care  After your port is placed, you will get a manufacturer's information card. The card has information about your port. Keep this card with you at all times.  Take care of the port as told by your health care provider. Ask your health care provider if you or a family member can get  training for taking care of the port at home. A home health care nurse may also take care of the port.  Make sure to remember what type of port you have. Incision care      Follow instructions from your health care provider about how to take care of your port insertion site. Make sure you: ? Wash your hands with soap and water before and after you change your bandage (dressing). If soap and water are not available, use hand sanitizer. ? Change your dressing as told by your health care provider. ? Leave stitches (sutures), skin glue, or adhesive strips in place. These skin closures may need to stay in place for 2 weeks or longer. If adhesive strip edges start to loosen and curl up, you may trim the loose edges. Do not remove adhesive strips completely unless your health care provider tells you to do that.  Check your port insertion site every day for signs of infection. Check for: ? Redness, swelling, or pain. ? Fluid or blood. ? Warmth. ? Pus or a bad smell. Activity  Return to your normal activities as told by your health care provider. Ask your health care provider what activities are safe for you.  Do not lift anything that is heavier than 10 lb (4.5 kg), or the limit that you are told, until your health care provider  says that it is safe. General instructions  Take over-the-counter and prescription medicines only as told by your health care provider.  Do not take baths, swim, or use a hot tub until your health care provider approves. Ask your health care provider if you may take showers. You may only be allowed to take sponge baths.  Do not drive for 24 hours if you were given a sedative during your procedure.  Wear a medical alert bracelet in case of an emergency. This will tell any health care providers that you have a port.  Keep all follow-up visits as told by your health care provider. This is important. Contact a health care provider if:  You cannot flush your port with saline as directed, or you cannot draw blood from the port.  You have a fever or chills.  You have redness, swelling, or pain around your port insertion site.  You have fluid or blood coming from your port insertion site.  Your port insertion site feels warm to the touch.  You have pus or a bad smell coming from the port insertion site. Get help right away if:  You have chest pain or shortness of breath.  You have bleeding from your port that you cannot control. Summary  Take care of the port as told by your health care provider. Keep the manufacturer's information card with you at all times.  Change your dressing as told by your health care provider.  Contact a health care provider if you have a fever or chills or if you have redness, swelling, or pain around your port insertion site.  Keep all follow-up visits as told by your health care provider. This information is not intended to replace advice given to you by your health care provider. Make sure you discuss any questions you have with your health care provider. Document Released: 09/28/2013 Document Revised: 07/06/2018 Document Reviewed: 07/06/2018 Elsevier Patient Education  [place].     Moderate Conscious Sedation, Adult, Care After These  instructions provide you with information about caring for yourself after your procedure. Your health care provider may also give you  more specific instructions. Your treatment has been planned according to current medical practices, but problems sometimes occur. Call your health care provider if you have any problems or questions after your procedure. What can I expect after the procedure? After your procedure, it is common:  To feel sleepy for several hours.  To feel clumsy and have poor balance for several hours.  To have poor judgment for several hours.  To vomit if you eat too soon. Follow these instructions at home: For at least 24 hours after the procedure:   Do not: ? Participate in activities where you could fall or become injured. ? Drive. ? Use heavy machinery. ? Drink alcohol. ? Take sleeping pills or medicines that cause drowsiness. ? Make important decisions or sign legal documents. ? Take care of children on your own.  Rest. Eating and drinking  Follow the diet recommended by your health care provider.  If you vomit: ? Drink water, juice, or soup when you can drink without vomiting. ? Make sure you have little or no nausea before eating solid foods. General instructions  Have a responsible adult stay with you until you are awake and alert.  Take over-the-counter and prescription medicines only as told by your health care provider.  If you smoke, do not smoke without supervision.  Keep all follow-up visits as told by your health care provider. This is important. Contact a health care provider if:  You keep feeling nauseous or you keep vomiting.  You feel light-headed.  You develop a rash.  You have a fever. Get help right away if:  You have trouble breathing. This information is not intended to replace advice given to you by your health care provider. Make sure you discuss any questions you have with your health care provider. Document Released:  09/28/2013 Document Revised: 11/20/2017 Document Reviewed: 03/29/2016 Elsevier Patient Education  2020 [employer].

## 2019-06-29 NOTE — H&P (Signed)
Chief Complaint: Patient was seen in consultation today for mesothelioma  Referring Physician(s): Mohamed,Mohamed  Supervising Physician: Daryll Brod  Patient Status: [facility] - Out-pt  History of Present Illness: Jeffrey [name] is a 85 y.o. 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  He underwent right VATS with talc pleurodesis and right PleurX catheter placement on 5/29 with [name].  The PleurX remains in place today. He now has plans for upcoming chemotherapy and is referred to IR for placement.   Patient presents today in his usual state of health. He has been NPO.  He takes Brilinta at home due to history of CAD with NSTEMI s/p CABG in 03/03/2010 and recent PCI of proximal circumflex artery 05/26/19. He remains on Brilinta with last dose taken this AM.    Past Medical History:  Diagnosis Date  . Anxiety   . Aortic stenosis    mild AS by 07/20/13 echo ([facility])  . Arthritis    "all over"  . Coronary artery disease    NSTEMI 06/2010, CABG x3=> [place]->LAD, SVG->OM1, SVG->PDA, DES LCx 10/2011, DES LAD 12/2011  . Depression   . GERD (gastroesophageal reflux disease)   . H/O hiatal hernia   . Hearing aid worn    B/L  . High cholesterol   . History of blood transfusion reaction 2013-03-03   "he almost died; he has to get irradiated blood next time"  . Hypertension   . Hypothyroidism   . Ischemic cardiomyopathy   . ITP (idiopathic thrombocytopenic purpura)    Dr. Gaynelle Arabian, on [place]  . Myocardial infarction ([place]) 2010-03-03  . Pneumonia 1940's X 1; 03/03/2014 X 1  . Rheumatoid arthritis ([place])   . Shortness of breath    with exertion, has not been very active  . Sleep apnea   . Wears glasses   . Wears partial dentures     Past Surgical History:  Procedure Laterality Date  . APPENDECTOMY    . BACK SURGERY    . CATARACT EXTRACTION, BILATERAL     . CHEST TUBE INSERTION Right 05/20/2019   Procedure: INSERTION PLEURAL DRAINAGE CATHETER;  Surgeon: Ivin Poot, MD;  Location: [facility];  Service: Thoracic;  Laterality: Right;  . COLONOSCOPY    . CORONARY ANGIOPLASTY WITH STENT PLACEMENT     DES Lcx 10/2011, DES LAD 12/2011  . CORONARY ARTERY BYPASS GRAFT  03-03-10   "CABG X3"  . CORONARY STENT INTERVENTION N/A 05/26/2019   Procedure: CORONARY STENT INTERVENTION;  Surgeon: Nelva Bush, MD;  Location: [place] [place] [facility];  Service: Cardiovascular;  Laterality: N/A;  . GASTROC RECESSION EXTREMITY Right 07/06/2015   Pasty Spillers (orthopedics- [place], [place])  . HERNIA REPAIR    . HIATAL HERNIA REPAIR    . KNEE ARTHROSCOPY Left 06/22/2014   w/I&D  . KNEE ARTHROSCOPY Left 06/22/2014   Procedure: [place];  Surgeon: Hessie Dibble, MD;  Location: [place];  Service: Orthopedics;  Laterality: Left;  . LEFT HEART CATH AND CORS/GRAFTS ANGIOGRAPHY N/A 05/26/2019   Procedure: LEFT HEART CATH AND CORS/GRAFTS ANGIOGRAPHY;  Surgeon: Nelva Bush, MD;  Location: [place] [facility];  Service: Cardiovascular;  Laterality: N/A;  . LUMBAR DISC SURGERY  1960's?  . MULTIPLE TOOTH EXTRACTIONS    . PLEURAL BIOPSY Right 05/20/2019   Procedure: PLEURAL BIOPSY;  Surgeon: Ivin Poot, MD;  Location:  [facility];  Service: Thoracic;  Laterality: Right;  . SHOULDER ARTHROSCOPY Right 04/09/2017   Procedure: ARTHROSCOPY SHOULDER;  Surgeon: Melrose Nakayama, MD;  Location: [place] [facility];  Service: Orthopedics;  Laterality: Right;  . STERNAL CLOSURE     "wires from OHS taken out; plate put in" (6/0/7371)  . SYNOVECTOMY Left 08/17/2014   Procedure: SYNOVECTOMY LEFT KNEE;  Surgeon: Hessie Dibble, MD;  Location: [place];  Service: Orthopedics;  Laterality: Left;  . TOTAL ANKLE ARTHROPLASTY Right 07/06/2015   Pasty Spillers (orthopedics- [facility])  . TOTAL HIP ARTHROPLASTY Right 06/29/2018   Procedure: RIGHT TOTAL HIP ARTHROPLASTY ANTERIOR APPROACH;   Surgeon: Melrose Nakayama, MD;  Location: [facility];  Service: Orthopedics;  Laterality: Right;  . TOTAL HIP ARTHROPLASTY Left 06/09/2016   Scott Streater Claiborne Billings (orthopedics- [place])  . TOTAL KNEE ARTHROPLASTY Left 11/24/2014   Procedure: TOTAL KNEE ARTHROPLASTY;  Surgeon: Hessie Dibble, MD;  Location: [place];  Service: Orthopedics;  Laterality: Left;  [name] Kitchen VIDEO ASSISTED THORACOSCOPY Right 05/20/2019   Procedure: VIDEO ASSISTED THORACOSCOPY;  Surgeon: Ivin Poot, MD;  Location: [place];  Service: Thoracic;  Laterality: Right;    Allergies: Lipitor [atorvastatin], Methylprednisolone, Morphine and related, Other, Sulfa antibiotics, and Doxycycline  Medications: Prior to Admission medications   Medication Sig Start Date End Date Taking? Authorizing Provider  albuterol (VENTOLIN HFA) 108 (90 Base) MCG/ACT inhaler Inhale 2 puffs into the lungs every 6 (six) hours as needed for wheezing or shortness of breath. 06/01/19   Martyn Ehrich, NP  ALPRAZolam Duanne Moron) 0.25 MG tablet Take 1 tablet (0.25 mg total) by mouth 2 (two) times daily as needed for anxiety. Patient not taking: Reported on 06/16/2019 07/02/18   Loni Dolly, PA-C  aspirin EC 81 MG tablet Take 1 tablet (81 mg total) by mouth daily. 05/28/19 08/26/19  Elodia Florence., MD  carbidopa-levodopa (SINEMET IR) 25-100 MG tablet Take 1 tablet by mouth 3 (three) times daily. 12/02/18   Tat, Eustace Quail, DO  dexamethasone (DECADRON) 4 MG tablet 4 mg p.o. twice daily the day before, day of and day after chemotherapy every 3 weeks 06/16/19   Curt Bears, MD  Fluticasone-Salmeterol (ADVAIR) 100-50 MCG/DOSE AEPB Inhale 1 puff into the lungs daily.    [provider]  folic acid (FOLVITE) 1 MG tablet Take 1 tablet (1 mg total) by mouth daily. 06/16/19   Curt Bears, MD  levothyroxine (SYNTHROID, LEVOTHROID) 200 MCG tablet Take 200 mcg by mouth at bedtime.     [provider]  lidocaine-prilocaine (EMLA) cream Apply 1  application topically as needed. 06/21/19   Curt Bears, MD  methotrexate (RHEUMATREX) 2.5 MG tablet Take 15 mg by mouth once a week. Caution:Chemotherapy. Protect from light.    [provider]  mirtazapine (REMERON) 15 MG tablet Take 15 mg by mouth at bedtime.    [provider]  Omega-3 Fatty Acids (FISH OIL) 1200 MG CAPS Take 1,200 mg by mouth at bedtime. 360 MG OMEGA-3    [provider]  omeprazole (PRILOSEC) 40 MG capsule Take 40 mg by mouth at bedtime.     [provider]  predniSONE (DELTASONE) 10 MG tablet Take 10 mg by mouth daily with breakfast.    [provider]  prochlorperazine (COMPAZINE) 10 MG tablet Take 1 tablet (10 mg total) by mouth every 6 (six) hours as needed for nausea or vomiting. 06/16/19   Curt Bears, MD  rOPINIRole (REQUIP) 0.5 MG tablet Take 0.5 mg by mouth  at bedtime.    [provider]  Spacer/Aero-Holding Chambers (AEROCHAMBER MV) inhaler Use as instructed 06/01/19   Martyn Ehrich, NP  ticagrelor (BRILINTA) 90 MG TABS tablet Take 1 tablet (90 mg total) by mouth 2 (two) times daily. 05/28/19 08/26/19  Elodia Florence., MD  Tiotropium Bromide Monohydrate (SPIRIVA RESPIMAT) 1.25 MCG/ACT AERS Inhale 2 puffs into the lungs daily. 06/01/19   Martyn Ehrich, NP  Vitamin D, Ergocalciferol, (DRISDOL) 1.25 MG (50000 UT) CAPS capsule Take 50,000 Units by mouth every 7 (seven) days.    [provider]     Family History  Problem Relation Age of Onset  . Heart disease Other   . Arthritis Other   . Heart disease Mother   . Alzheimer's disease Father   . Rheum arthritis Sister   . Rheum arthritis Brother   . Healthy Son     Social History   Socioeconomic History  . Marital status: Married    Spouse name: Not on file  . Number of children: Not on file  . Years of education: 12+  . Highest education level: Not on file  Occupational History  . Occupation: retired    Comment: Administrator, arts  . Financial resource strain: Not on file  . Food insecurity    Worry: Not on file    Inability: Not on file  . Transportation needs    Medical: Not on file    Non-medical: Not on file  Tobacco Use  . Smoking status: Former Smoker    Packs/day: 1.00    Years: 15.00    Pack years: 15.00    Types: Cigarettes    Quit date: 12/22/1966    Years since quitting: 52.5  . Smokeless tobacco: Former Systems developer    Types: Chew  . Tobacco comment: "quit smoking ~ late ~ 60's; quit chewing in the 1970's"  Substance and Sexual Activity  . Alcohol use: No  . Drug use: No  . Sexual activity: Not Currently  Lifestyle  . Physical activity    Days per week: Not on file    Minutes per session: Not on file  . Stress: Not on file  Relationships  . Social Herbalist on phone: Not on file    Gets together: Not on file    Attends religious service: Not on file    Active member of club or organization: Not on file    Attends meetings of clubs or organizations: Not on file    Relationship status: Not on file  Other Topics Concern  . Not on file  Social History Narrative  . Not on file     Review of Systems: A 12 point ROS discussed and pertinent positives are indicated in the HPI above.  All other systems are negative.  Review of Systems  Constitutional: Negative for fatigue and fever.  Respiratory: Negative for cough and shortness of breath.   Cardiovascular: Negative for chest pain.  Gastrointestinal: Negative for abdominal pain.  Musculoskeletal: Negative for back pain.  Psychiatric/Behavioral: Negative for behavioral problems and confusion.    Vital Signs: BP (!) 141/89   Pulse 80   Temp 98.4 F (36.9 C) (Oral)   Resp (!) 22   Wt 200 lb (90.7 kg)   SpO2 95%   BMI 26.39 kg/m   Physical Exam Vitals signs and nursing note reviewed.  Constitutional:      Appearance: Normal appearance.  HENT:     Mouth/Throat:  Mouth: Mucous membranes are moist.      Pharynx: Oropharynx is clear.  Neck:     Musculoskeletal: Normal range of motion and neck supple.  Cardiovascular:     Rate and Rhythm: Normal rate and regular rhythm.     Heart sounds: Murmur present.  Pulmonary:     Effort: Pulmonary effort is normal. No respiratory distress.     Breath sounds: Normal breath sounds.  Neurological:     General: No focal deficit present.     Mental Status: He is alert and oriented to person, place, and time. Mental status is at baseline.  Psychiatric:        Mood and Affect: Mood normal.        Behavior: Behavior normal.        Thought Content: Thought content normal.        Judgment: Judgment normal.      MD Evaluation Airway: WNL Heart: WNL Abdomen: WNL Chest/ Lungs: WNL ASA  Classification: 3 Mallampati/Airway Score: Two   Imaging: Dg Chest 2 View  Result Date: 06/13/2019 CLINICAL DATA:  Shortness of breath. History of right pleural effusion and status post prior PleurX catheter placement. EXAM: CHEST - 2 VIEW COMPARISON:  05/31/2019 FINDINGS: Stable heart size and tortuosity of the thoracic aorta. Stable positioning of tunneled right-sided PleurX catheter with no new significant right pleural fluid volume present. There may be a small amount of pleural fluid adjacent to the insertion site of the catheter. No pneumothorax present. There is some atelectasis at the right lung base. No pulmonary edema or focal airspace consolidation. IMPRESSION: Stable positioning of PleurX catheter with no significant right pleural fluid volume present. There may be a small amount of right pleural fluid. Right basilar atelectasis present. Electronically Signed   By: Aletta Edouard M.D.   On: 06/13/2019 14:47   Dg Chest 2 View  Result Date: 05/31/2019 CLINICAL DATA:  Shortness of breath.  Recent right VATS. EXAM: CHEST - 2 VIEW COMPARISON:  Chest x-ray dated May 28, 2019. FINDINGS: Stable cardiomegaly status post CABG. Normal mediastinal contours. Normal  pulmonary vascularity. Right greater than left basilar atelectasis/scarring, similar to prior study. Unchanged small right pleural effusion. No pneumothorax. No acute osseous abnormality. IMPRESSION: 1. Unchanged small right pleural effusion and bibasilar atelectasis/scarring. Electronically Signed   By: Titus Dubin M.D.   On: 05/31/2019 13:40    Labs:  CBC: Recent Labs    05/27/19 0349 05/28/19 0238 06/16/19 1418 06/29/19 1315  WBC 14.0* 12.8* 9.1 11.2*  HGB 11.9* 12.2* 13.0 12.6*  HCT 36.0* 36.9* 41.0 39.1  PLT 259 271 265 289    COAGS: Recent Labs    05/14/19 1810 05/19/19 1112 06/29/19 1315  INR 1.0 1.1 1.0  APTT  --  33  --     BMP: Recent Labs    05/27/19 0349 05/28/19 0238 06/16/19 1418 06/29/19 1315  NA 139 137 140 140  K 4.0 3.7 4.5 4.4  CL 103 101 103 105  CO2 26 25 27 23   GLUCOSE 101* 112* 135* 113*  BUN 11 10 20 21   CALCIUM 9.1 9.1 8.8* 9.3  CREATININE 1.24 1.15 1.30* 1.09  GFRNONAA 54* 59* 51* >60  GFRAA >60 >60 59* >60    LIVER FUNCTION TESTS: Recent Labs    05/26/19 0635 05/27/19 0349 05/28/19 0238 06/16/19 1418  BILITOT 0.8 0.8 0.6 0.3  AST 31 25 21 24   ALT 14 11 8 9   ALKPHOS 83 96 109 88  PROT  6.0* 5.8* 6.0* 7.4  ALBUMIN 2.6* 2.6* 2.7* 3.2*    TUMOR MARKERS: No results for input(s): AFPTM, CEA, CA199, CHROMGRNA in the last 8760 hours.  Assessment and Plan: Patient with past medical history of CAD, CABG in 2011, PCI after NSTEMI 6/4/2- presents with complaint of recent diagnosis of mesothelioma.  IR consulted for Port-A-Cath placement at the request of Dr. Jana Hakim. Case reviewed by Dr. Annamaria Boots who approves patient for procedure.  Patient presents today in their usual state of health.  He has been NPO and is currently on Brilinta after recent PCI.   Risks and benefits of image guided port-a-catheter placement was discussed with the patient including, but not limited to bleeding, infection, pneumothorax, or fibrin sheath  development and need for additional procedures.  All of the patient's questions were answered, patient is agreeable to proceed. Consent signed and in chart.  Thank you for this interesting consult.  I greatly enjoyed meeting Kamar W Finelli and look forward to participating in their care.  A copy of this report was sent to the requesting provider on this date.  Electronically Signed: Docia Barrier, PA 06/29/2019, 1:49 PM   I spent a total of  30 Minutes   in face to face in clinical consultation, greater than 50% of which was counseling/coordinating care for mesothelioma.

## 2019-06-29 NOTE — Procedures (Signed)
Mesothelioma  S/p RT IJ POWER PORT  Tip svcra No comp Stable ebl min Full report in pacs Ready for use

## 2019-06-30 ENCOUNTER — Ambulatory Visit (HOSPITAL_COMMUNITY)
Admission: RE | Admit: 2019-06-30 | Discharge: 2019-06-30 | Disposition: A | Discharge status: Home / Self Care | Payer: Medicare Other | Source: Ambulatory Visit | Attending: Internal Medicine | Admitting: Internal Medicine

## 2019-06-30 DIAGNOSIS — I252 Old myocardial infarction: Secondary | ICD-10-CM | POA: Diagnosis not present

## 2019-06-30 DIAGNOSIS — Z79890 Hormone replacement therapy (postmenopausal): Secondary | ICD-10-CM | POA: Insufficient documentation

## 2019-06-30 DIAGNOSIS — Z79899 Other long term (current) drug therapy: Secondary | ICD-10-CM | POA: Diagnosis not present

## 2019-06-30 DIAGNOSIS — C450 Mesothelioma of pleura: Secondary | ICD-10-CM | POA: Diagnosis present

## 2019-06-30 DIAGNOSIS — E039 Hypothyroidism, unspecified: Secondary | ICD-10-CM | POA: Insufficient documentation

## 2019-06-30 DIAGNOSIS — G4730 Sleep apnea, unspecified: Secondary | ICD-10-CM | POA: Diagnosis not present

## 2019-06-30 DIAGNOSIS — I10 Essential (primary) hypertension: Secondary | ICD-10-CM | POA: Diagnosis not present

## 2019-06-30 DIAGNOSIS — Z7982 Long term (current) use of aspirin: Secondary | ICD-10-CM | POA: Diagnosis not present

## 2019-06-30 DIAGNOSIS — I2510 Atherosclerotic heart disease of native coronary artery without angina pectoris: Secondary | ICD-10-CM | POA: Diagnosis not present

## 2019-06-30 DIAGNOSIS — E785 Hyperlipidemia, unspecified: Secondary | ICD-10-CM | POA: Diagnosis not present

## 2019-06-30 DIAGNOSIS — J90 Pleural effusion, not elsewhere classified: Secondary | ICD-10-CM | POA: Insufficient documentation

## 2019-06-30 DIAGNOSIS — Z87891 Personal history of nicotine dependence: Secondary | ICD-10-CM | POA: Diagnosis not present

## 2019-06-30 DIAGNOSIS — I714 Abdominal aortic aneurysm, without rupture: Secondary | ICD-10-CM | POA: Diagnosis not present

## 2019-06-30 DIAGNOSIS — K219 Gastro-esophageal reflux disease without esophagitis: Secondary | ICD-10-CM | POA: Diagnosis not present

## 2019-06-30 LAB — GLUCOSE, CAPILLARY: Glucose-Capillary: 96 mg/dL (ref 70–99)

## 2019-06-30 MED ORDER — FLUDEOXYGLUCOSE F - 18 (FDG) INJECTION
10.9000 | Freq: Once | INTRAVENOUS | Status: AC | PRN
  Administered 2019-06-30: 10.9 via INTRAVENOUS

## 2019-07-01 ENCOUNTER — Other Ambulatory Visit: Payer: Self-pay

## 2019-07-01 ENCOUNTER — Inpatient Hospital Stay: Payer: Medicare Other | Attending: Internal Medicine

## 2019-07-01 VITALS — BP 135/77 | HR 84 | Temp 98.5°F | Resp 18

## 2019-07-01 DIAGNOSIS — I2510 Atherosclerotic heart disease of native coronary artery without angina pectoris: Secondary | ICD-10-CM | POA: Insufficient documentation

## 2019-07-01 DIAGNOSIS — Z7982 Long term (current) use of aspirin: Secondary | ICD-10-CM | POA: Insufficient documentation

## 2019-07-01 DIAGNOSIS — C450 Mesothelioma of pleura: Secondary | ICD-10-CM | POA: Diagnosis not present

## 2019-07-01 DIAGNOSIS — G4730 Sleep apnea, unspecified: Secondary | ICD-10-CM | POA: Insufficient documentation

## 2019-07-01 DIAGNOSIS — E039 Hypothyroidism, unspecified: Secondary | ICD-10-CM | POA: Diagnosis not present

## 2019-07-01 DIAGNOSIS — I700 Atherosclerosis of aorta: Secondary | ICD-10-CM | POA: Insufficient documentation

## 2019-07-01 DIAGNOSIS — I10 Essential (primary) hypertension: Secondary | ICD-10-CM | POA: Insufficient documentation

## 2019-07-01 DIAGNOSIS — I255 Ischemic cardiomyopathy: Secondary | ICD-10-CM | POA: Insufficient documentation

## 2019-07-01 DIAGNOSIS — M069 Rheumatoid arthritis, unspecified: Secondary | ICD-10-CM | POA: Insufficient documentation

## 2019-07-01 DIAGNOSIS — K219 Gastro-esophageal reflux disease without esophagitis: Secondary | ICD-10-CM | POA: Insufficient documentation

## 2019-07-01 DIAGNOSIS — E7800 Pure hypercholesterolemia, unspecified: Secondary | ICD-10-CM | POA: Insufficient documentation

## 2019-07-01 DIAGNOSIS — F329 Major depressive disorder, single episode, unspecified: Secondary | ICD-10-CM | POA: Insufficient documentation

## 2019-07-01 DIAGNOSIS — I714 Abdominal aortic aneurysm, without rupture: Secondary | ICD-10-CM | POA: Insufficient documentation

## 2019-07-01 DIAGNOSIS — D693 Immune thrombocytopenic purpura: Secondary | ICD-10-CM | POA: Diagnosis not present

## 2019-07-01 DIAGNOSIS — Z5111 Encounter for antineoplastic chemotherapy: Secondary | ICD-10-CM | POA: Diagnosis not present

## 2019-07-01 DIAGNOSIS — I252 Old myocardial infarction: Secondary | ICD-10-CM | POA: Insufficient documentation

## 2019-07-01 DIAGNOSIS — Z79899 Other long term (current) drug therapy: Secondary | ICD-10-CM | POA: Insufficient documentation

## 2019-07-01 DIAGNOSIS — J90 Pleural effusion, not elsewhere classified: Secondary | ICD-10-CM | POA: Insufficient documentation

## 2019-07-01 DIAGNOSIS — I350 Nonrheumatic aortic (valve) stenosis: Secondary | ICD-10-CM | POA: Insufficient documentation

## 2019-07-01 MED ORDER — SODIUM CHLORIDE 0.9% FLUSH
10.0000 mL | INTRAVENOUS | Status: DC | PRN
  Administered 2019-07-01: 10 mL
  Filled 2019-07-01: qty 10

## 2019-07-01 MED ORDER — HEPARIN SOD (PORK) LOCK FLUSH 100 UNIT/ML IV SOLN
500.0000 [IU] | Freq: Once | INTRAVENOUS | Status: AC | PRN
  Administered 2019-07-01: 500 [IU]
  Filled 2019-07-01: qty 5

## 2019-07-01 MED ORDER — SODIUM CHLORIDE 0.9 % IV SOLN
406.5000 mg | Freq: Once | INTRAVENOUS | Status: AC
  Administered 2019-07-01: 410 mg via INTRAVENOUS
  Filled 2019-07-01: qty 41

## 2019-07-01 MED ORDER — PALONOSETRON HCL INJECTION 0.25 MG/5ML
0.2500 mg | Freq: Once | INTRAVENOUS | Status: AC
  Administered 2019-07-01: 10:00:00 0.25 mg via INTRAVENOUS

## 2019-07-01 MED ORDER — SODIUM CHLORIDE 0.9 % IV SOLN
Freq: Once | INTRAVENOUS | Status: AC
  Administered 2019-07-01: 10:00:00 via INTRAVENOUS
  Filled 2019-07-01: qty 5

## 2019-07-01 MED ORDER — SODIUM CHLORIDE 0.9 % IV SOLN
510.0000 mg/m2 | Freq: Once | INTRAVENOUS | Status: AC
  Administered 2019-07-01: 11:00:00 1100 mg via INTRAVENOUS
  Filled 2019-07-01: qty 40

## 2019-07-01 MED ORDER — SODIUM CHLORIDE 0.9 % IV SOLN
Freq: Once | INTRAVENOUS | Status: AC
  Administered 2019-07-01: 10:00:00 via INTRAVENOUS
  Filled 2019-07-01: qty 250

## 2019-07-01 MED ORDER — PALONOSETRON HCL INJECTION 0.25 MG/5ML
INTRAVENOUS | Status: AC
  Filled 2019-07-01: qty 5

## 2019-07-01 NOTE — Progress Notes (Signed)
Continue carbo dose = 410mg  despite decrease in SCr per Dr Julien Nordmann

## 2019-07-01 NOTE — Progress Notes (Signed)
Per Dr. Julien Nordmann, may use AST, ALT, and bilirubin lab work from 6/25 for treatment today.

## 2019-07-01 NOTE — Patient Instructions (Signed)
Pemetrexed injection What is this medicine? PEMETREXED (PEM e TREX ed) is a chemotherapy drug used to treat lung cancers like non-small cell lung cancer and mesothelioma. It may also be used to treat other cancers. This medicine may be used for other purposes; ask your health care provider or pharmacist if you have questions. COMMON BRAND NAME(S): Alimta What should I tell my health care provider before I take this medicine? They need to know if you have any of these conditions:  infection (especially a virus infection such as chickenpox, cold sores, or herpes)  kidney disease  low blood counts, like low white cell, platelet, or red cell counts  lung or breathing disease, like asthma  radiation therapy  an unusual or allergic reaction to pemetrexed, other medicines, foods, dyes, or preservative  pregnant or trying to get pregnant  breast-feeding How should I use this medicine? This drug is given as an infusion into a vein. It is administered in a hospital or clinic by a specially trained health care professional. Talk to your pediatrician regarding the use of this medicine in children. Special care may be needed. Overdosage: If you think you have taken too much of this medicine contact a poison control center or emergency room at once. NOTE: This medicine is only for you. Do not share this medicine with others. What if I miss a dose? It is important not to miss your dose. Call your doctor or health care professional if you are unable to keep an appointment. What may interact with this medicine? This medicine may interact with the following medications:  Ibuprofen This list may not describe all possible interactions. Give your health care provider a list of all the medicines, herbs, non-prescription drugs, or dietary supplements you use. Also tell them if you smoke, drink alcohol, or use illegal drugs. Some items may interact with your medicine. What should I watch for while using  this medicine? Visit your doctor for checks on your progress. This drug may make you feel generally unwell. This is not uncommon, as chemotherapy can affect healthy cells as well as cancer cells. Report any side effects. Continue your course of treatment even though you feel ill unless your doctor tells you to stop. In some cases, you may be given additional medicines to help with side effects. Follow all directions for their use. Call your doctor or health care professional for advice if you get a fever, chills or sore throat, or other symptoms of a cold or flu. Do not treat yourself. This drug decreases your body's ability to fight infections. Try to avoid being around people who are sick. This medicine may increase your risk to bruise or bleed. Call your doctor or health care professional if you notice any unusual bleeding. Be careful brushing and flossing your teeth or using a toothpick because you may get an infection or bleed more easily. If you have any dental work done, tell your dentist you are receiving this medicine. Avoid taking products that contain aspirin, acetaminophen, ibuprofen, naproxen, or ketoprofen unless instructed by your doctor. These medicines may hide a fever. Call your doctor or health care professional if you get diarrhea or mouth sores. Do not treat yourself. To protect your kidneys, drink water or other fluids as directed while you are taking this medicine. Do not become pregnant while taking this medicine or for 6 months after stopping it. Women should inform their doctor if they wish to become pregnant or think they might be pregnant. Men should  not father a child while taking this medicine and for 3 months after stopping it. This may interfere with the ability to father a child. You should talk to your doctor or health care professional if you are concerned about your fertility. There is a potential for serious side effects to an unborn child. Talk to your health care  professional or pharmacist for more information. Do not breast-feed an infant while taking this medicine or for 1 week after stopping it. What side effects may I notice from receiving this medicine? Side effects that you should report to your doctor or health care professional as soon as possible:  allergic reactions like skin rash, itching or hives, swelling of the face, lips, or tongue  breathing problems  redness, blistering, peeling or loosening of the skin, including inside the mouth  signs and symptoms of bleeding such as bloody or black, tarry stools; red or dark-brown urine; spitting up blood or brown material that looks like coffee grounds; red spots on the skin; unusual bruising or bleeding from the eye, gums, or nose  signs and symptoms of infection like fever or chills; cough; sore throat; pain or trouble passing urine  signs and symptoms of kidney injury like trouble passing urine or change in the amount of urine  signs and symptoms of liver injury like dark yellow or brown urine; general ill feeling or flu-like symptoms; light-colored stools; loss of appetite; nausea; right upper belly pain; unusually weak or tired; yellowing of the eyes or skin Side effects that usually do not require medical attention (report to your doctor or health care professional if they continue or are bothersome):  constipation  mouth sores  nausea, vomiting  unusually weak or tired This list may not describe all possible side effects. Call your doctor for medical advice about side effects. You may report side effects to FDA at 1-800-FDA-1088. Where should I keep my medicine? This drug is given in a hospital or clinic and will not be stored at home. NOTE: This sheet is a summary. It may not cover all possible information. If you have questions about this medicine, talk to your doctor, pharmacist, or health care provider.  2020 Elsevier/Gold Standard (2018-01-27 16:11:33)  Carboplatin  injection What is this medicine? CARBOPLATIN (KAR boe pla tin) is a chemotherapy drug. It targets fast dividing cells, like cancer cells, and causes these cells to die. This medicine is used to treat ovarian cancer and many other cancers. This medicine may be used for other purposes; ask your health care provider or pharmacist if you have questions. COMMON BRAND NAME(S): Paraplatin What should I tell my health care provider before I take this medicine? They need to know if you have any of these conditions:  blood disorders  hearing problems  kidney disease  recent or ongoing radiation therapy  an unusual or allergic reaction to carboplatin, cisplatin, other chemotherapy, other medicines, foods, dyes, or preservatives  pregnant or trying to get pregnant  breast-feeding How should I use this medicine? This drug is usually given as an infusion into a vein. It is administered in a hospital or clinic by a specially trained health care professional. Talk to your pediatrician regarding the use of this medicine in children. Special care may be needed. Overdosage: If you think you have taken too much of this medicine contact a poison control center or emergency room at once. NOTE: This medicine is only for you. Do not share this medicine with others. What if I miss a  dose? It is important not to miss a dose. Call your doctor or health care professional if you are unable to keep an appointment. What may interact with this medicine?  medicines for seizures  medicines to increase blood counts like filgrastim, pegfilgrastim, sargramostim  some antibiotics like amikacin, gentamicin, neomycin, streptomycin, tobramycin  vaccines Talk to your doctor or health care professional before taking any of these medicines:  acetaminophen  aspirin  ibuprofen  ketoprofen  naproxen This list may not describe all possible interactions. Give your health care provider a list of all the medicines,  herbs, non-prescription drugs, or dietary supplements you use. Also tell them if you smoke, drink alcohol, or use illegal drugs. Some items may interact with your medicine. What should I watch for while using this medicine? Your condition will be monitored carefully while you are receiving this medicine. You will need important blood work done while you are taking this medicine. This drug may make you feel generally unwell. This is not uncommon, as chemotherapy can affect healthy cells as well as cancer cells. Report any side effects. Continue your course of treatment even though you feel ill unless your doctor tells you to stop. In some cases, you may be given additional medicines to help with side effects. Follow all directions for their use. Call your doctor or health care professional for advice if you get a fever, chills or sore throat, or other symptoms of a cold or flu. Do not treat yourself. This drug decreases your body's ability to fight infections. Try to avoid being around people who are sick. This medicine may increase your risk to bruise or bleed. Call your doctor or health care professional if you notice any unusual bleeding. Be careful brushing and flossing your teeth or using a toothpick because you may get an infection or bleed more easily. If you have any dental work done, tell your dentist you are receiving this medicine. Avoid taking products that contain aspirin, acetaminophen, ibuprofen, naproxen, or ketoprofen unless instructed by your doctor. These medicines may hide a fever. Do not become pregnant while taking this medicine. Women should inform their doctor if they wish to become pregnant or think they might be pregnant. There is a potential for serious side effects to an unborn child. Talk to your health care professional or pharmacist for more information. Do not breast-feed an infant while taking this medicine. What side effects may I notice from receiving this medicine? Side  effects that you should report to your doctor or health care professional as soon as possible:  allergic reactions like skin rash, itching or hives, swelling of the face, lips, or tongue  signs of infection - fever or chills, cough, sore throat, pain or difficulty passing urine  signs of decreased platelets or bleeding - bruising, pinpoint red spots on the skin, black, tarry stools, nosebleeds  signs of decreased red blood cells - unusually weak or tired, fainting spells, lightheadedness  breathing problems  changes in hearing  changes in vision  chest pain  high blood pressure  low blood counts - This drug may decrease the number of white blood cells, red blood cells and platelets. You may be at increased risk for infections and bleeding.  nausea and vomiting  pain, swelling, redness or irritation at the injection site  pain, tingling, numbness in the hands or feet  problems with balance, talking, walking  trouble passing urine or change in the amount of urine Side effects that usually do not require medical  attention (report to your doctor or health care professional if they continue or are bothersome):  hair loss  loss of appetite  metallic taste in the mouth or changes in taste This list may not describe all possible side effects. Call your doctor for medical advice about side effects. You may report side effects to FDA at 1-800-FDA-1088. Where should I keep my medicine? This drug is given in a hospital or clinic and will not be stored at home. NOTE: This sheet is a summary. It may not cover all possible information. If you have questions about this medicine, talk to your doctor, pharmacist, or health care provider.  2020 Elsevier/Gold Standard (2008-03-14 14:38:05)

## 2019-07-03 LAB — ACID FAST CULTURE WITH REFLEXED SENSITIVITIES (MYCOBACTERIA): Acid Fast Culture: NEGATIVE

## 2019-07-04 ENCOUNTER — Telehealth: Payer: Self-pay | Admitting: Medical Oncology

## 2019-07-04 ENCOUNTER — Telehealth: Payer: Self-pay | Admitting: *Deleted

## 2019-07-04 MED ORDER — CHLORPROMAZINE HCL 25 MG PO TABS: 25.0000 mg | ORAL_TABLET | Freq: Three times a day (TID) | ORAL | 0 refills | Status: DC

## 2019-07-04 NOTE — Telephone Encounter (Signed)
Called and spoke with pt, several messages have been left on pt's numbers and rx for thorazine was sent to [facility] at [id number].  Pt advised he has not checked his messages today. He will pick up Rx today at [facility].

## 2019-07-04 NOTE — Telephone Encounter (Signed)
Pt returned call lmovm with pharmacy information.

## 2019-07-04 NOTE — Telephone Encounter (Signed)
"  Monica Martinez Call's Daughter in law [place],  [phone]).  Calling on his behalf for problem with hiccups.  Has continuous hiccups since chemotherapy treatment last Wednesday.  Has tried holding breath, breathing in a bag and sugar.  Not sleeping well with hiccups,  Is there something that can be called in to [facility] to help?  Someone call us to let us know if we need to go to [facility].  Thanks."  Forwarding information to provider for review.

## 2019-07-04 NOTE — Telephone Encounter (Signed)
LV to return my call about chemo f/u.

## 2019-07-04 NOTE — Telephone Encounter (Signed)
Pt called w/c/o hiccups started Friday after Tx-Carbo/Alimta. Reviewed with MD, rx for thorazine will be sent to pharmacy. Returned call to pt lmovm to call back to confirm which pharmacy rx needs to go to.

## 2019-07-05 ENCOUNTER — Inpatient Hospital Stay: Payer: Medicare Other

## 2019-07-05 ENCOUNTER — Inpatient Hospital Stay (HOSPITAL_BASED_OUTPATIENT_CLINIC_OR_DEPARTMENT_OTHER): Payer: Medicare Other | Admitting: Internal Medicine

## 2019-07-05 ENCOUNTER — Encounter: Payer: Self-pay | Admitting: Internal Medicine

## 2019-07-05 ENCOUNTER — Other Ambulatory Visit: Payer: Self-pay

## 2019-07-05 VITALS — BP 106/58 | HR 86 | Temp 98.7°F | Resp 18 | Ht 73.0 in | Wt 206.6 lb

## 2019-07-05 DIAGNOSIS — C450 Mesothelioma of pleura: Secondary | ICD-10-CM

## 2019-07-05 DIAGNOSIS — I10 Essential (primary) hypertension: Secondary | ICD-10-CM

## 2019-07-05 DIAGNOSIS — Z5111 Encounter for antineoplastic chemotherapy: Secondary | ICD-10-CM

## 2019-07-05 DIAGNOSIS — G4730 Sleep apnea, unspecified: Secondary | ICD-10-CM

## 2019-07-05 DIAGNOSIS — M069 Rheumatoid arthritis, unspecified: Secondary | ICD-10-CM

## 2019-07-05 DIAGNOSIS — I2510 Atherosclerotic heart disease of native coronary artery without angina pectoris: Secondary | ICD-10-CM

## 2019-07-05 DIAGNOSIS — Z7982 Long term (current) use of aspirin: Secondary | ICD-10-CM

## 2019-07-05 DIAGNOSIS — I350 Nonrheumatic aortic (valve) stenosis: Secondary | ICD-10-CM

## 2019-07-05 DIAGNOSIS — I714 Abdominal aortic aneurysm, without rupture: Secondary | ICD-10-CM

## 2019-07-05 DIAGNOSIS — E7800 Pure hypercholesterolemia, unspecified: Secondary | ICD-10-CM

## 2019-07-05 DIAGNOSIS — K219 Gastro-esophageal reflux disease without esophagitis: Secondary | ICD-10-CM

## 2019-07-05 DIAGNOSIS — E039 Hypothyroidism, unspecified: Secondary | ICD-10-CM | POA: Diagnosis not present

## 2019-07-05 DIAGNOSIS — I252 Old myocardial infarction: Secondary | ICD-10-CM

## 2019-07-05 DIAGNOSIS — I255 Ischemic cardiomyopathy: Secondary | ICD-10-CM

## 2019-07-05 DIAGNOSIS — I700 Atherosclerosis of aorta: Secondary | ICD-10-CM

## 2019-07-05 DIAGNOSIS — J90 Pleural effusion, not elsewhere classified: Secondary | ICD-10-CM

## 2019-07-05 DIAGNOSIS — Z79899 Other long term (current) drug therapy: Secondary | ICD-10-CM

## 2019-07-05 DIAGNOSIS — F329 Major depressive disorder, single episode, unspecified: Secondary | ICD-10-CM

## 2019-07-05 DIAGNOSIS — D693 Immune thrombocytopenic purpura: Secondary | ICD-10-CM

## 2019-07-05 LAB — CMP (CANCER CENTER ONLY)
ALT: 11 U/L (ref 0–44)
AST: 21 U/L (ref 15–41)
Albumin: 3.2 g/dL — ABNORMAL LOW (ref 3.5–5.0)
Alkaline Phosphatase: 63 U/L (ref 38–126)
Anion gap: 9 (ref 5–15)
BUN: 25 mg/dL — ABNORMAL HIGH (ref 8–23)
CO2: 27 mmol/L (ref 22–32)
Calcium: 8.8 mg/dL — ABNORMAL LOW (ref 8.9–10.3)
Chloride: 102 mmol/L (ref 98–111)
Creatinine: 1.07 mg/dL (ref 0.61–1.24)
GFR, Est AFR Am: >60 mL/min (ref >60)
GFR, Estimated: >60 mL/min (ref >60)
Glucose, Bld: 117 mg/dL — ABNORMAL HIGH (ref 70–99)
Potassium: 4.3 mmol/L (ref 3.5–5.1)
Sodium: 138 mmol/L (ref 135–145)
Total Bilirubin: 0.6 mg/dL (ref 0.3–1.2)
Total Protein: 7 g/dL (ref 6.5–8.1)

## 2019-07-05 LAB — CBC WITH DIFFERENTIAL (CANCER CENTER ONLY)
Abs Immature Granulocytes: 0.01 10*3/uL (ref 0.00–0.07)
Basophils Absolute: 0 10*3/uL (ref 0.0–0.1)
Basophils Relative: 0 %
Eosinophils Absolute: 0.2 10*3/uL (ref 0.0–0.5)
Eosinophils Relative: 2 %
HCT: 38.9 % — ABNORMAL LOW (ref 39.0–52.0)
Hemoglobin: 12.8 g/dL — ABNORMAL LOW (ref 13.0–17.0)
Immature Granulocytes: 0 %
Lymphocytes Relative: 14 %
Lymphs Abs: 1.2 10*3/uL (ref 0.7–4.0)
MCH: 30.5 pg (ref 26.0–34.0)
MCHC: 32.9 g/dL (ref 30.0–36.0)
MCV: 92.6 fL (ref 80.0–100.0)
Monocytes Absolute: 0.1 10*3/uL (ref 0.1–1.0)
Monocytes Relative: 1 %
Neutro Abs: 7.2 10*3/uL (ref 1.7–7.7)
Neutrophils Relative %: 83 %
Platelet Count: 235 10*3/uL (ref 150–400)
RBC: 4.2 MIL/uL — ABNORMAL LOW (ref 4.22–5.81)
RDW: 14.7 % (ref 11.5–15.5)
WBC Count: 8.7 10*3/uL (ref 4.0–10.5)
nRBC: 0 % (ref 0.0–0.2)

## 2019-07-05 NOTE — Progress Notes (Signed)
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  PRIOR THERAPY: None.  CURRENT THERAPY: Systemic chemotherapy with carboplatin for AUC of 5 and Alimta 500 mg/M2 every 3 weeks.  Status post 1 cycle.  INTERVAL HISTORY: Jeffrey [name] 85 y.o. male returns to the clinic today for follow-up visit.  The patient started the first cycle of his treatment with carboplatin and Alimta few days ago.  He tolerated the first cycle of his treatment well with no concerning adverse effect except for hiccups.  He was given prescription for Thorazine with improvement of the hiccups.  He denied having any current chest pain but has mild shortness of breath with exertion and cough with thick mucus.  He denied having any fever or chills.  He has no nausea, vomiting, diarrhea or constipation.  He has no significant weight loss or night sweats.  He is here today for evaluation and repeat blood work.  He had a Port-A-Cath placed few days ago.  MEDICAL HISTORY: Past Medical History:  Diagnosis Date   Anxiety    Aortic stenosis    mild AS by 07/20/13 echo ([facility])   Arthritis    "all over"   Coronary artery disease    NSTEMI 06/2010, CABG x3=> [place]->LAD, SVG->OM1, SVG->PDA, DES LCx 10/2011, DES LAD 12/2011   Depression    GERD (gastroesophageal reflux disease)    H/O hiatal hernia    Hearing aid worn    B/L   High cholesterol    History of blood transfusion reaction Mar 21, 2013   "he almost died; he has to get irradiated blood next time"   Hypertension    Hypothyroidism    Ischemic cardiomyopathy    ITP (idiopathic thrombocytopenic purpura)    Dr. Gaynelle Arabian, on Promacta    Myocardial infarction [facility]) 2010-03-21   Pneumonia 1940's X 1; 03/21/2014 X 1   Rheumatoid arthritis ([place])    Shortness of breath    with exertion, has not been very active   Sleep apnea    Wears glasses    Wears partial dentures     ALLERGIES:  is allergic to lipitor [atorvastatin]; methylprednisolone; morphine and related; other; sulfa antibiotics; and doxycycline.  MEDICATIONS:  Current Outpatient Medications  Medication Sig Dispense Refill   albuterol (VENTOLIN HFA) 108 (90 Base) MCG/ACT inhaler Inhale 2 puffs into the lungs every 6 (six) hours as needed for wheezing or shortness of breath. 1 Inhaler 6   ALPRAZolam (XANAX) 0.25 MG tablet Take 1 tablet (0.25 mg total) by mouth 2 (two) times daily as needed for anxiety. 20 tablet 0   aspirin EC 81 MG tablet Take 1 tablet (81 mg total) by mouth daily. 30 tablet 2   carbidopa-levodopa (SINEMET IR) 25-100 MG tablet Take 1 tablet by mouth 3 (three) times daily. 270 tablet 1   chlorproMAZINE (THORAZINE) 25 MG tablet Take 1 tablet (25 mg total) by mouth 3 (three) times daily. 30 tablet 0   dexamethasone (DECADRON) 4 MG tablet 4 mg p.o. twice daily the day before, day of and day after chemotherapy every 3 weeks 40 tablet 1   Fluticasone-Salmeterol (ADVAIR) 100-50 MCG/DOSE AEPB Inhale 1 puff into the lungs daily.     folic acid (FOLVITE) 1 MG  tablet Take 1 tablet (1 mg total) by mouth daily. 30 tablet 4   levothyroxine (SYNTHROID, LEVOTHROID) 200 MCG tablet Take 200 mcg by mouth at bedtime.      lidocaine-prilocaine (EMLA) cream Apply 1 application topically as needed. 30 g 0   methotrexate (RHEUMATREX) 2.5 MG tablet Take 15 mg by mouth once a week. Caution:Chemotherapy. Protect from light.     mirtazapine (REMERON) 15 MG tablet Take 15 mg by mouth at bedtime.     Omega-3 Fatty Acids (FISH OIL) 1200 MG CAPS Take 1,200 mg by mouth at bedtime. 360 MG OMEGA-3     omeprazole (PRILOSEC) 40 MG capsule Take 40 mg by mouth at bedtime.       predniSONE (DELTASONE) 10 MG tablet Take 10 mg by mouth daily with breakfast.     prochlorperazine (COMPAZINE) 10 MG tablet Take 1 tablet (10 mg total) by mouth every 6 (six) hours as needed for nausea or vomiting. 30 tablet 0   rOPINIRole (REQUIP) 0.5 MG tablet Take 0.5 mg by mouth at bedtime.     Spacer/Aero-Holding Chambers (AEROCHAMBER MV) inhaler Use as instructed 1 each 0   ticagrelor (BRILINTA) 90 MG TABS tablet Take 1 tablet (90 mg total) by mouth 2 (two) times daily. 60 tablet 2   Tiotropium Bromide Monohydrate (SPIRIVA RESPIMAT) 1.25 MCG/ACT AERS Inhale 2 puffs into the lungs daily. 4 g 3   Vitamin D, Ergocalciferol, (DRISDOL) 1.25 MG (50000 UT) CAPS capsule Take 50,000 Units by mouth every 7 (seven) days.     losartan (COZAAR) 50 MG tablet Take 50 mg by mouth daily.     No current facility-administered medications for this visit.     SURGICAL HISTORY:  Past Surgical History:  Procedure Laterality Date   APPENDECTOMY     BACK SURGERY     CATARACT EXTRACTION, BILATERAL     CHEST TUBE INSERTION Right 05/20/2019   Procedure: INSERTION PLEURAL DRAINAGE CATHETER;  Surgeon: Ivin Poot, MD;  Location: [place];  Service: Thoracic;  Laterality: Right;   COLONOSCOPY     CORONARY ANGIOPLASTY WITH STENT PLACEMENT     DES Lcx 10/2011, DES LAD 12/2011   CORONARY ARTERY BYPASS GRAFT  2011   "CABG X3"   CORONARY STENT INTERVENTION N/A 05/26/2019   Procedure: CORONARY STENT INTERVENTION;  Surgeon: Nelva Bush, MD;  Location: [place] [facility];  Service: Cardiovascular;  Laterality: N/A;   GASTROC RECESSION EXTREMITY Right 07/06/2015   Pasty Spillers (orthopedics- [place], [place])   HERNIA REPAIR     HIATAL HERNIA REPAIR     IR IMAGING GUIDED PORT INSERTION  06/29/2019   KNEE ARTHROSCOPY Left 06/22/2014   w/I&D   KNEE ARTHROSCOPY Left 06/22/2014   Procedure: IRRIGATION AND DEBRIDEMENT WITH CHRONDROPLASTY;  Surgeon: Hessie Dibble, MD;  Location: [place];  Service:  Orthopedics;  Laterality: Left;   LEFT HEART CATH AND CORS/GRAFTS ANGIOGRAPHY N/A 05/26/2019   Procedure: LEFT HEART CATH AND CORS/GRAFTS ANGIOGRAPHY;  Surgeon: Nelva Bush, MD;  Location: [place] [facility];  Service: Cardiovascular;  Laterality: N/A;   LUMBAR DISC SURGERY  1960's?   MULTIPLE TOOTH EXTRACTIONS     PLEURAL BIOPSY Right 05/20/2019   Procedure: PLEURAL BIOPSY;  Surgeon: Ivin Poot, MD;  Location: [place];  Service: Thoracic;  Laterality: Right;   SHOULDER ARTHROSCOPY Right 04/09/2017   Procedure: ARTHROSCOPY SHOULDER;  Surgeon: Melrose Nakayama, MD;  Location: [place];  Service: Orthopedics;  Laterality: Right;   STERNAL CLOSURE     "wires  from OHS taken out; plate put in" (03/28/961)   SYNOVECTOMY Left 08/17/2014   Procedure: SYNOVECTOMY LEFT KNEE;  Surgeon: Hessie Dibble, MD;  Location: [place];  Service: Orthopedics;  Laterality: Left;   TOTAL ANKLE ARTHROPLASTY Right 07/06/2015   Pasty Spillers (orthopedics- [place])   TOTAL HIP ARTHROPLASTY Right 06/29/2018   Procedure: RIGHT TOTAL HIP ARTHROPLASTY ANTERIOR APPROACH;  Surgeon: Melrose Nakayama, MD;  Location: [facility];  Service: Orthopedics;  Laterality: Right;   TOTAL HIP ARTHROPLASTY Left 06/09/2016   Nicki Reaper Streater Claiborne Billings (orthopedics- [facility] )   TOTAL KNEE ARTHROPLASTY Left 11/24/2014   Procedure: TOTAL KNEE ARTHROPLASTY;  Surgeon: Hessie Dibble, MD;  Location: [place];  Service: Orthopedics;  Laterality: Left;   VIDEO ASSISTED THORACOSCOPY Right 05/20/2019   Procedure: VIDEO ASSISTED THORACOSCOPY;  Surgeon: Prescott Gum, Collier Salina, MD;  Location: [place];  Service: Thoracic;  Laterality: Right;    REVIEW OF SYSTEMS:  A comprehensive review of systems was negative except for: Constitutional: positive for fatigue Respiratory: positive for cough and dyspnea on exertion   PHYSICAL EXAMINATION: General appearance: alert, cooperative, fatigued and no distress Head: Normocephalic, without obvious abnormality,  atraumatic Neck: no adenopathy, no JVD, supple, symmetrical, trachea midline and thyroid not enlarged, symmetric, no tenderness/mass/nodules Lymph nodes: Cervical, supraclavicular, and axillary nodes normal. Resp: clear to auscultation bilaterally Back: symmetric, no curvature. ROM normal. No CVA tenderness. Cardio: regular rate and rhythm, S1, S2 normal, no murmur, click, rub or gallop GI: soft, non-tender; bowel sounds normal; no masses,  no organomegaly Extremities: extremities normal, atraumatic, no cyanosis or edema  ECOG PERFORMANCE STATUS: 1 - Symptomatic but completely ambulatory  Blood pressure (!) 106/58, pulse 86, temperature 98.7 F (37.1 C), temperature source Oral, resp. rate 18, height 6\' 1"  (1.854 m), weight 206 lb 9.6 oz (93.7 kg), SpO2 100 %.  LABORATORY DATA: Lab Results  Component Value Date   WBC 8.7 07/05/2019   HGB 12.8 (L) 07/05/2019   HCT 38.9 (L) 07/05/2019   MCV 92.6 07/05/2019   PLT 235 07/05/2019      Chemistry      Component Value Date/Time   NA 140 06/29/2019 1315   K 4.4 06/29/2019 1315   CL 105 06/29/2019 1315   CO2 23 06/29/2019 1315   BUN 21 06/29/2019 1315   CREATININE 1.09 06/29/2019 1315   CREATININE 1.30 (H) 06/16/2019 1418   CREATININE 1.79 (H) 10/12/2014 1136      Component Value Date/Time   CALCIUM 9.3 06/29/2019 1315   ALKPHOS 88 06/16/2019 1418   AST 24 06/16/2019 1418   ALT 9 06/16/2019 1418   BILITOT 0.3 06/16/2019 1418       RADIOGRAPHIC STUDIES: Dg Chest 2 View  Result Date: 06/13/2019 CLINICAL DATA:  Shortness of breath. History of right pleural effusion and status post prior PleurX catheter placement. EXAM: CHEST - 2 VIEW COMPARISON:  05/31/2019 FINDINGS: Stable heart size and tortuosity of the thoracic aorta. Stable positioning of tunneled right-sided PleurX catheter with no new significant right pleural fluid volume present. There may be a small amount of pleural fluid adjacent to the insertion site of the catheter.  No pneumothorax present. There is some atelectasis at the right lung base. No pulmonary edema or focal airspace consolidation. IMPRESSION: Stable positioning of PleurX catheter with no significant right pleural fluid volume present. There may be a small amount of right pleural fluid. Right basilar atelectasis present. Electronically Signed   By: Aletta Edouard M.D.   On: 06/13/2019 14:47  Nm Pet Image Initial (pi) Skull Base To Thigh  Result Date: 06/30/2019 CLINICAL DATA:  Initial treatment strategy for malignant mesothelioma of the pleura. EXAM: NUCLEAR MEDICINE PET SKULL BASE TO THIGH TECHNIQUE: 10.9 mCi F-18 FDG was injected intravenously. Full-ring PET imaging was performed from the skull base to thigh after the radiotracer. CT data was obtained and used for attenuation correction and anatomic localization. Fasting blood glucose: 97 mg/dl COMPARISON:  CT chest 05/23/2019 FINDINGS: Mediastinal blood pool activity: SUV max 3.45 Liver activity: SUV max NA NECK: No hypermetabolic lymph nodes in the neck. Incidental CT findings: none CHEST: No FDG avid supraclavicular or axillary lymph nodes. 7 mm high left paratracheal lymph node is identified within SUV max of 4.26. Subcarinal lymph node measures 1.2 cm and has an SUV max of 5.5. Right hilar lymph node measures 1 cm and has an SUV max of 5.5. There is a small loculated right pleural effusion overlying the right lower lobe. Right-sided chest tube is in place. Posterior and medial pleural calcifications are again noted. Thin rind of soft tissue encasing the entire right lung is identified which is FDG avid compatible with malignant mesothelioma. -Overlying the lateral right lower lobe SUV max from pleural tumor is equal to 7.4. -Anteromedial FDG avid pleural tumor overlying the right middle lobe and right lower lobe has an SUV max of 7.58. -Tumor overlying the lateral right upper lobe has an SUV max of 5.79. -Along the paramediastinal right upper lobe pleural  tumor has an SUV max of 6.62. Incidental CT findings: none ABDOMEN/PELVIS: No abnormal hypermetabolic activity within the liver, pancreas, adrenal glands, or spleen. No hypermetabolic lymph nodes in the abdomen or pelvis. Incidental CT findings: Aortic atherosclerosis. Cystic lesion is identified arising from neck of pancreas measuring 1.9 cm and 14 HU. No FDG uptake identified within this likely benign abnormality. Infrarenal abdominal aorta measures 3.1 cm AP. SKELETON: No focal hypermetabolic activity to suggest skeletal metastasis. Incidental CT findings: none IMPRESSION: 1. Thin rind of pleural tumor encasing the right lung is identified and is FDG avid compatible with malignant mesothelioma. 2. Small loculated right pleural effusion is identified with chest tube in place. 3. Mildly FDG avid right hilar, subcarinal and high left paratracheal lymph nodes which may reflect metastatic adenopathy. 4.  Aortic Atherosclerosis (ICD10-I70.0). 5. Infrarenal abdominal aortic aneurysm. Recommend followup by ultrasound in 3 years. This recommendation follows ACR consensus guidelines: White Paper of the ACR Incidental Findings Committee II on Vascular Findings. J Am Coll Radiol 2013; 10:789-794. Aortic aneurysm NOS (ICD10-I71.9). Electronically Signed   By: Kerby Moors M.D.   On: 06/30/2019 13:05   Ir Imaging Guided Port Insertion  Result Date: 06/29/2019 CLINICAL DATA:  MALIGNANT MESOTHELIOMA OF THE PLEURA EXAM: RIGHT INTERNAL JUGULAR SINGLE LUMEN POWER PORT CATHETER INSERTION Date:  06/29/2019 06/29/2019 3:41 pm Radiologist:  Jerilynn Mages. Daryll Brod, MD Guidance:  Ultrasound and fluoroscopic MEDICATIONS: Ancef 2 g; The antibiotic was administered within an appropriate time interval prior to skin puncture. ANESTHESIA/SEDATION: Versed 2.0 mg IV; Fentanyl 100 mcg IV; Moderate Sedation Time:  25 minutes The patient was continuously monitored during the procedure by the interventional radiology nurse under my direct supervision.  FLUOROSCOPY TIME:  0 minutes, 30 seconds (8 mGy) COMPLICATIONS: None immediate. CONTRAST:  None. PROCEDURE: Informed consent was obtained from the patient following explanation of the procedure, risks, benefits and alternatives. The patient understands, agrees and consents for the procedure. All questions were addressed. A time out was performed. Maximal barrier sterile technique utilized including  caps, mask, sterile gowns, sterile gloves, large sterile drape, hand hygiene, and 2% chlorhexidine scrub. Under sterile conditions and local anesthesia, right internal jugular micropuncture venous access was performed. Access was performed with ultrasound. Images were obtained for documentation of the patent right internal jugular vein. A guide wire was inserted followed by a transitional dilator. This allowed insertion of a guide wire and catheter into the IVC. Measurements were obtained from the SVC / RA junction back to the right IJ venotomy site. In the right infraclavicular chest, a subcutaneous pocket was created over the second anterior rib. This was done under sterile conditions and local anesthesia. 1% lidocaine with epinephrine was utilized for this. A 2.5 cm incision was made in the skin. Blunt dissection was performed to create a subcutaneous pocket over the right pectoralis major muscle. The pocket was flushed with saline vigorously. There was adequate hemostasis. The port catheter was assembled and checked for leakage. The port catheter was secured in the pocket with two retention sutures. The tubing was tunneled subcutaneously to the right venotomy site and inserted into the SVC/RA junction through a valved peel-away sheath. Position was confirmed with fluoroscopy. Images were obtained for documentation. The patient tolerated the procedure well. No immediate complications. Incisions were closed in a two layer fashion with 4 - 0 Vicryl suture. Dermabond was applied to the skin. The port catheter was  accessed, blood was aspirated followed by saline and heparin flushes. Needle was removed. A dry sterile dressing was applied. IMPRESSION: Ultrasound and fluoroscopically guided right internal jugular single lumen power port catheter insertion. Tip in the SVC/RA junction. Catheter ready for use. Electronically Signed   By: Jerilynn Mages.  Shick M.D.   On: 06/29/2019 15:50    ASSESSMENT AND PLAN: This is a very pleasant 85 years old white male with malignant pleural mesothelioma, epithelioid type involving the right hemithorax. The patient started systemic chemotherapy with carboplatin for AUC of 5 and Alimta 500 mg/M2 status post 1 cycle given last week.  He tolerated the first few days of his treatment well with no concerning adverse effects. Repeat CBC today showed no concerning findings except for mild anemia. I recommended for the patient to continue his current treatment and he will proceed with cycle #2 in around 2 weeks from now. He will continue to have weekly lab for close monitoring of his condition. For the [place] he will continue with Thorazine for now. The patient will come back for follow-up visit with the start of cycle #2. He was advised to call immediately if he has any other concerning symptoms in the interval. The patient voices understanding of current disease status and treatment options and is in agreement with the current care plan.  All questions were answered. The patient knows to call the clinic with any problems, questions or concerns. We can certainly see the patient much sooner if necessary.  I spent 10 minutes counseling the patient face to face. The total time spent in the appointment was 15 minutes.  Disclaimer: This note was dictated with voice recognition software. Similar sounding words can inadvertently be transcribed and may not be corrected upon review.

## 2019-07-06 ENCOUNTER — Encounter: Payer: Self-pay | Admitting: Primary Care

## 2019-07-06 ENCOUNTER — Ambulatory Visit (INDEPENDENT_AMBULATORY_CARE_PROVIDER_SITE_OTHER): Payer: Medicare Other | Admitting: Primary Care

## 2019-07-06 ENCOUNTER — Telehealth: Payer: Self-pay | Admitting: Internal Medicine

## 2019-07-06 VITALS — BP 110/72 | HR 82 | Temp 98.4°F | Ht 72.0 in | Wt 204.6 lb

## 2019-07-06 DIAGNOSIS — J90 Pleural effusion, not elsewhere classified: Secondary | ICD-10-CM

## 2019-07-06 DIAGNOSIS — R0602 Shortness of breath: Secondary | ICD-10-CM | POA: Diagnosis not present

## 2019-07-06 MED ORDER — IPRATROPIUM-ALBUTEROL 0.5-2.5 (3) MG/3ML IN SOLN: 3.0000 mL | Freq: Four times a day (QID) | RESPIRATORY_TRACT | 3 refills | Status: DC | PRN

## 2019-07-06 NOTE — Patient Instructions (Addendum)
Recommendations: Stop Spiriva sample Start Duoneb (ipatroprium-albuterol) x4 day  Continue Advair twice daily   Referral: New nebulizer start  DME for pleurex bottles for draining Monday and Friday   Follow-up: Televisit with Beth in 2-4 weeks to review medications  Dr. Elsworth Soho in 2 months

## 2019-07-06 NOTE — Progress Notes (Signed)
@Patient  ID: Jeffrey [name], male    DOB: 03/05/1934, 85 y.o.   MRN: [id number]  No chief complaint on file.   Referring provider: [name], MD  HPI: 85 year old male, former smoker quit 1968 (15-pack-year history). 10 year history of asbestos exposure from working in [employer] 40 years ago. Past medical history significant for COPD/asthma, OSA, Parkinson's disease, hypothyroidism, MRSE septic arthritis left knee, left total hip arthroplasty, anemia/ITP, coronary artery disease s/p CABG, mild aortic stenosis, anxiety.   Recent hospitalization from 5/23-6/6 for shortness of breath and right pleural effusion. Consulted inpatient by Dr. Nelda Marseille and Dr. Elsworth Soho with pulmonary. CT chest 5/23 showed loculated right pleural effusion and pleural plaques.  Status post right thoracentesis on 5/25. Fluid culture showed lymphocytic exudate with low glucose, felt to be unlikely rheumatoid effusion. Cytology negative with a few mesothelial cells. Uncertain for mesothelioma related to asbestos exposure.  Since TCTS consulted and patient had right VATS with pleural biopsy, TALC and right Pleurx catheter by [name] on 5/29.  Course complicated by an NSTEMI, status post catheterization per cardiology 6/4 with PCI to proximal LCx with DES.  Recommending aspirin and Brilinta x3 months and then Brilinta x9 months. Plan drainage pleurx MWF. Surgical pathology is still pending- contacted lab and was told pathology was sent for second opinion.  06/01/2019 Patient presents today for hospital follow-up visit. Accompanied by his wife.  Patient is doing okay considering.  He continues to have moderate shortness of breath. He has been using Advair diskus twice daily. Patient does not have any rescue inhaler. No PFTs on file, he is a former smoker. He had a chest x-ray yesterday with TCTS that showed unchanged small right pleural effusion and bibasilar atelectasis/scarring. Right Pleurx catheter is being drained three  times a week. Wife states that it has been draining on average 150cc each time. He does not have much appetite.  He has tried protein shakes but does not like how thick they are. His mood is somewhat depressed/anxious.   07/06/2019 Patient presents today for 4 week follow-up. He was started on Spiriva at last visit. Continue Advair twice daily. Breathing has been ok. Continues Advair twice daily and spiriva once daily. Reports no noticeable improvement with addition of Spiriva 1.68mcg. He reports most noticeable benefit from his Albuterol rescue inhaler. Asking if he can get bottle drainage bottles for his pleurex catheter. His appetite has improved.   Following with Dr. Nils Pyle office, seen on 6/22. Impression recurrent right pleural effusion secondary to mesothelioma. Continues pleurx drain scheduled but reduced to Mon-Thursday. Plan to repeat CXR in 1 month. Referred to thoracic oncology to discuss right malignant epithelioid pleural tumor. Seen by Dr. Julien Nordmann with Oncology on June 25th, ordered PET scan to r/o metastatic disease. Family wanted treatment; plan pallative systemic chemo with carboplatin.   Imaging: PET 06/30/19- Thin rind of pleural tumor encasing the right lung identified and is FDG compatible with malignant mesothelioma. Small loculated right pleural effusion. Mildly FDG avid right hilar, subcarinal and high left paratracheal lymph nodes which may reflect metastatic adenopathy  Allergies  Allergen Reactions  . Lipitor [Atorvastatin] Other (See Comments)    muscle spasms cramps  . Methylprednisolone Other (See Comments)    cramps cramps  . Morphine And Related     Hallucinations at times  . Other     If patient is to receive blood - blood must be treated witgh radiation because his body will not accept a normal infusion   .  Sulfa Antibiotics Itching  . Doxycycline Rash    Doxycycline caused itching redness on scalp and head Doxycycline caused itching redness on scalp and  head    Immunization History  Administered Date(s) Administered  . Influenza,inj,Quad PF,6+ Mos 09/04/2014  . Pneumococcal Polysaccharide-23 09/05/2011  . Tdap 09/16/2012    Past Medical History:  Diagnosis Date  . Anxiety   . Aortic stenosis    mild AS by 07/20/13 echo ([facility])  . Arthritis    "all over"  . Coronary artery disease    NSTEMI 06/2010, CABG x3=> [place]->LAD, SVG->OM1, SVG->PDA, DES LCx 10/2011, DES LAD 12/2011  . Depression   . GERD (gastroesophageal reflux disease)   . H/O hiatal hernia   . Hearing aid worn    B/L  . High cholesterol   . History of blood transfusion reaction 2013/03/29   "he almost died; he has to get irradiated blood next time"  . Hypertension   . Hypothyroidism   . Ischemic cardiomyopathy   . ITP (idiopathic thrombocytopenic purpura)    Dr. Gaynelle Arabian, on [place]  . Myocardial infarction ([place]) 2010/03/29  . Pneumonia 1940's X 1; 29-Mar-2014 X 1  . Rheumatoid arthritis ([place])   . Shortness of breath    with exertion, has not been very active  . Sleep apnea   . Wears glasses   . Wears partial dentures     Tobacco History: Social History   Tobacco Use  Smoking Status Former Smoker  . Packs/day: 1.00  . Years: 15.00  . Pack years: 15.00  . Types: Cigarettes  . Quit date: 12/22/1966  . Years since quitting: 52.5  Smokeless Tobacco Former Systems developer  . Types: Chew  Tobacco Comment   "quit smoking ~ late ~ 60's; quit chewing in the 1970's"   Counseling given: Not Answered Comment: "quit smoking ~ late ~ 60's; quit chewing in the 1970's"   Outpatient Medications Prior to Visit  Medication Sig Dispense Refill  . ALPRAZolam (XANAX) 0.25 MG tablet Take 1 tablet (0.25 mg total) by mouth 2 (two) times daily as needed for anxiety. 20 tablet 0  . aspirin EC 81 MG tablet Take 1 tablet (81 mg total) by mouth daily. 30 tablet 2  . carbidopa-levodopa (SINEMET IR) 25-100 MG tablet Take 1 tablet by mouth 3 (three) times daily. 270 tablet 1  .  chlorproMAZINE (THORAZINE) 25 MG tablet Take 1 tablet (25 mg total) by mouth 3 (three) times daily. 30 tablet 0  . dexamethasone (DECADRON) 4 MG tablet 4 mg p.o. twice daily the day before, day of and day after chemotherapy every 3 weeks 40 tablet 1  . Fluticasone-Salmeterol (ADVAIR) 100-50 MCG/DOSE AEPB Inhale 1 puff into the lungs daily.    . folic acid (FOLVITE) 1 MG tablet Take 1 tablet (1 mg total) by mouth daily. 30 tablet 4  . levothyroxine (SYNTHROID, LEVOTHROID) 200 MCG tablet Take 200 mcg by mouth at bedtime.     . lidocaine-prilocaine (EMLA) cream Apply 1 application topically as needed. 30 g 0  . losartan (COZAAR) 50 MG tablet Take 50 mg by mouth daily.    . methotrexate (RHEUMATREX) 2.5 MG tablet Take 15 mg by mouth once a week. Caution:Chemotherapy. Protect from light.    . mirtazapine (REMERON) 15 MG tablet Take 15 mg by mouth at bedtime.    . Omega-3 Fatty Acids (FISH OIL) 1200 MG CAPS Take 1,200 mg by mouth at bedtime. 360 MG OMEGA-3    . omeprazole (PRILOSEC) 40  MG capsule Take 40 mg by mouth at bedtime.     . predniSONE (DELTASONE) 10 MG tablet Take 10 mg by mouth daily with breakfast.    . prochlorperazine (COMPAZINE) 10 MG tablet Take 1 tablet (10 mg total) by mouth every 6 (six) hours as needed for nausea or vomiting. 30 tablet 0  . rOPINIRole (REQUIP) 0.5 MG tablet Take 0.5 mg by mouth at bedtime.    [name] Kitchen Spacer/Aero-Holding Chambers (AEROCHAMBER MV) inhaler Use as instructed 1 each 0  . ticagrelor (BRILINTA) 90 MG TABS tablet Take 1 tablet (90 mg total) by mouth 2 (two) times daily. 60 tablet 2  . Vitamin D, Ergocalciferol, (DRISDOL) 1.25 MG (50000 UT) CAPS capsule Take 50,000 Units by mouth every 7 (seven) days.    [name] Kitchen albuterol (VENTOLIN HFA) 108 (90 Base) MCG/ACT inhaler Inhale 2 puffs into the lungs every 6 (six) hours as needed for wheezing or shortness of breath. 1 Inhaler 6  . Tiotropium Bromide Monohydrate (SPIRIVA RESPIMAT) 1.25 MCG/ACT AERS Inhale 2 puffs into the lungs  daily. 4 g 3   No facility-administered medications prior to visit.     Review of Systems  Review of Systems  Constitutional: Negative for appetite change, fever and unexpected weight change.  HENT: Negative.   Respiratory: Positive for shortness of breath. Negative for cough and wheezing.    Physical Exam  BP 110/72   Pulse 82   Temp 98.4 F (36.9 C) (Oral)   Ht 6' (1.829 m)   Wt 204 lb 9.6 oz (92.8 kg)   SpO2 98%   BMI 27.75 kg/m  Physical Exam Constitutional:      General: He is not in acute distress.    Appearance: Normal appearance.  Cardiovascular:     Rate and Rhythm: Normal rate and regular rhythm.  Pulmonary:     Effort: Pulmonary effort is normal. No respiratory distress.     Breath sounds: No wheezing.     Comments: Diminished right base Skin:    General: Skin is warm and dry.  Neurological:     General: No focal deficit present.     Mental Status: He is alert. Mental status is at baseline.  Psychiatric:        Mood and Affect: Mood normal.        Behavior: Behavior normal.        Thought Content: Thought content normal.        Judgment: Judgment normal.      Lab Results:  CBC    Component Value Date/Time   WBC 8.7 07/05/2019 1305   WBC 11.2 (H) 06/29/2019 1315   RBC 4.20 (L) 07/05/2019 1305   HGB 12.8 (L) 07/05/2019 1305   HCT 38.9 (L) 07/05/2019 1305   PLT 235 07/05/2019 1305   MCV 92.6 07/05/2019 1305   MCH 30.5 07/05/2019 1305   MCHC 32.9 07/05/2019 1305   RDW 14.7 07/05/2019 1305   LYMPHSABS 1.2 07/05/2019 1305   MONOABS 0.1 07/05/2019 1305   EOSABS 0.2 07/05/2019 1305   BASOSABS 0.0 07/05/2019 1305    BMET    Component Value Date/Time   NA 138 07/05/2019 1305   K 4.3 07/05/2019 1305   CL 102 07/05/2019 1305   CO2 27 07/05/2019 1305   GLUCOSE 117 (H) 07/05/2019 1305   BUN 25 (H) 07/05/2019 1305   CREATININE 1.07 07/05/2019 1305   CREATININE 1.79 (H) 10/12/2014 1136   CALCIUM 8.8 (L) 07/05/2019 1305   GFRNONAA >60  07/05/2019 1305  GFRNONAA 36 (L) 10/12/2014 1136   GFRAA >60 07/05/2019 1305   GFRAA 41 (L) 10/12/2014 1136    BNP    Component Value Date/Time   BNP 77.0 05/14/2019 1810    ProBNP No results found for: PROBNP  Imaging: Dg Chest 2 View  Result Date: 06/13/2019 CLINICAL DATA:  Shortness of breath. History of right pleural effusion and status post prior PleurX catheter placement. EXAM: CHEST - 2 VIEW COMPARISON:  05/31/2019 FINDINGS: Stable heart size and tortuosity of the thoracic aorta. Stable positioning of tunneled right-sided PleurX catheter with no new significant right pleural fluid volume present. There may be a small amount of pleural fluid adjacent to the insertion site of the catheter. No pneumothorax present. There is some atelectasis at the right lung base. No pulmonary edema or focal airspace consolidation. IMPRESSION: Stable positioning of PleurX catheter with no significant right pleural fluid volume present. There may be a small amount of right pleural fluid. Right basilar atelectasis present. Electronically Signed   By: Aletta Edouard M.D.   On: 06/13/2019 14:47   Nm Pet Image Initial (pi) Skull Base To Thigh  Result Date: 06/30/2019 CLINICAL DATA:  Initial treatment strategy for malignant mesothelioma of the pleura. EXAM: NUCLEAR MEDICINE PET SKULL BASE TO THIGH TECHNIQUE: 10.9 mCi F-18 FDG was injected intravenously. Full-ring PET imaging was performed from the skull base to thigh after the radiotracer. CT data was obtained and used for attenuation correction and anatomic localization. Fasting blood glucose: 97 mg/dl COMPARISON:  CT chest 05/23/2019 FINDINGS: Mediastinal blood pool activity: SUV max 3.45 Liver activity: SUV max NA NECK: No hypermetabolic lymph nodes in the neck. Incidental CT findings: none CHEST: No FDG avid supraclavicular or axillary lymph nodes. 7 mm high left paratracheal lymph node is identified within SUV max of 4.26. Subcarinal lymph node measures 1.2  cm and has an SUV max of 5.5. Right hilar lymph node measures 1 cm and has an SUV max of 5.5. There is a small loculated right pleural effusion overlying the right lower lobe. Right-sided chest tube is in place. Posterior and medial pleural calcifications are again noted. Thin rind of soft tissue encasing the entire right lung is identified which is FDG avid compatible with malignant mesothelioma. -Overlying the lateral right lower lobe SUV max from pleural tumor is equal to 7.4. -Anteromedial FDG avid pleural tumor overlying the right middle lobe and right lower lobe has an SUV max of 7.58. -Tumor overlying the lateral right upper lobe has an SUV max of 5.79. -Along the paramediastinal right upper lobe pleural tumor has an SUV max of 6.62. Incidental CT findings: none ABDOMEN/PELVIS: No abnormal hypermetabolic activity within the liver, pancreas, adrenal glands, or spleen. No hypermetabolic lymph nodes in the abdomen or pelvis. Incidental CT findings: Aortic atherosclerosis. Cystic lesion is identified arising from neck of pancreas measuring 1.9 cm and 14 HU. No FDG uptake identified within this likely benign abnormality. Infrarenal abdominal aorta measures 3.1 cm AP. SKELETON: No focal hypermetabolic activity to suggest skeletal metastasis. Incidental CT findings: none IMPRESSION: 1. Thin rind of pleural tumor encasing the right lung is identified and is FDG avid compatible with malignant mesothelioma. 2. Small loculated right pleural effusion is identified with chest tube in place. 3. Mildly FDG avid right hilar, subcarinal and high left paratracheal lymph nodes which may reflect metastatic adenopathy. 4.  Aortic Atherosclerosis (ICD10-I70.0). 5. Infrarenal abdominal aortic aneurysm. Recommend followup by ultrasound in 3 years. This recommendation follows ACR consensus guidelines: White Paper of  the ACR Incidental Findings Committee II on Vascular Findings. J Am Coll Radiol 2013; 10:789-794. Aortic aneurysm NOS  (ICD10-I71.9). Electronically Signed   By: Kerby Moors M.D.   On: 06/30/2019 13:05   Ir Imaging Guided Port Insertion  Result Date: 06/29/2019 CLINICAL DATA:  MALIGNANT MESOTHELIOMA OF THE PLEURA EXAM: RIGHT INTERNAL JUGULAR SINGLE LUMEN POWER PORT CATHETER INSERTION Date:  06/29/2019 06/29/2019 3:41 pm Radiologist:  Jerilynn Mages. Daryll Brod, MD Guidance:  Ultrasound and fluoroscopic MEDICATIONS: Ancef 2 g; The antibiotic was administered within an appropriate time interval prior to skin puncture. ANESTHESIA/SEDATION: Versed 2.0 mg IV; Fentanyl 100 mcg IV; Moderate Sedation Time:  25 minutes The patient was continuously monitored during the procedure by the interventional radiology nurse under my direct supervision. FLUOROSCOPY TIME:  0 minutes, 30 seconds (8 mGy) COMPLICATIONS: None immediate. CONTRAST:  None. PROCEDURE: Informed consent was obtained from the patient following explanation of the procedure, risks, benefits and alternatives. The patient understands, agrees and consents for the procedure. All questions were addressed. A time out was performed. Maximal barrier sterile technique utilized including caps, mask, sterile gowns, sterile gloves, large sterile drape, hand hygiene, and 2% chlorhexidine scrub. Under sterile conditions and local anesthesia, right internal jugular micropuncture venous access was performed. Access was performed with ultrasound. Images were obtained for documentation of the patent right internal jugular vein. A guide wire was inserted followed by a transitional dilator. This allowed insertion of a guide wire and catheter into the IVC. Measurements were obtained from the SVC / RA junction back to the right IJ venotomy site. In the right infraclavicular chest, a subcutaneous pocket was created over the second anterior rib. This was done under sterile conditions and local anesthesia. 1% lidocaine with epinephrine was utilized for this. A 2.5 cm incision was made in the skin. Blunt dissection  was performed to create a subcutaneous pocket over the right pectoralis major muscle. The pocket was flushed with saline vigorously. There was adequate hemostasis. The port catheter was assembled and checked for leakage. The port catheter was secured in the pocket with two retention sutures. The tubing was tunneled subcutaneously to the right venotomy site and inserted into the SVC/RA junction through a valved peel-away sheath. Position was confirmed with fluoroscopy. Images were obtained for documentation. The patient tolerated the procedure well. No immediate complications. Incisions were closed in a two layer fashion with 4 - 0 Vicryl suture. Dermabond was applied to the skin. The port catheter was accessed, blood was aspirated followed by saline and heparin flushes. Needle was removed. A dry sterile dressing was applied. IMPRESSION: Ultrasound and fluoroscopically guided right internal jugular single lumen power port catheter insertion. Tip in the SVC/RA junction. Catheter ready for use. Electronically Signed   By: Jerilynn Mages.  Shick M.D.   On: 06/29/2019 15:50     Assessment & Plan:   Shortness of breath - Breathing is stable but no noticeable improvement with addition of Spiriva - I do not think getting PFTs at this time would be of any added valve - Plan continue Advair twice daily - Change Spiriva to Duonebs (Ipratropium-albuterol) scheduled 4x day  - FU televisit for medication review in 2 weeks   Pleural effusion on right - Right pleural effusion secondary to mesothelioma - Continue pleurex cath drainage Monday and Thursday - Referral DME for additional drainage bottles  - Plan pallative systemic chemo 6 cycles  - Following with Dr. Earlie Server with Oncology      Martyn Ehrich, NP 07/06/2019

## 2019-07-06 NOTE — Assessment & Plan Note (Signed)
-   Breathing is stable but no noticeable improvement with addition of Spiriva - I do not think getting PFTs at this time would be of any added valve - Plan continue Advair twice daily - Change Spiriva to Duonebs (Ipratropium-albuterol) scheduled 4x day  - FU televisit for medication review in 2 weeks

## 2019-07-06 NOTE — Addendum Note (Signed)
Addended by: Valerie Salts on: 07/06/2019 01:59 PM   Modules accepted: Orders

## 2019-07-06 NOTE — Assessment & Plan Note (Addendum)
-   Right pleural effusion secondary to mesothelioma - Continue pleurex cath drainage Monday and Thursday - Referral DME for additional drainage bottles  - Plan pallative systemic chemo 6 cycles  - Following with Dr. Earlie Server with Oncology

## 2019-07-06 NOTE — Telephone Encounter (Signed)
Scheduled appt per 7/14 los - pt to get an updated schedule next visit.

## 2019-07-07 ENCOUNTER — Telehealth: Payer: Self-pay | Admitting: Primary Care

## 2019-07-07 DIAGNOSIS — J449 Chronic obstructive pulmonary disease, unspecified: Secondary | ICD-10-CM

## 2019-07-07 MED ORDER — IPRATROPIUM-ALBUTEROL 0.5-2.5 (3) MG/3ML IN SOLN: 3.0000 mL | Freq: Four times a day (QID) | RESPIRATORY_TRACT | 3 refills | Status: DC | PRN

## 2019-07-07 NOTE — Telephone Encounter (Signed)
OV note references COPD. Will print prescription and note and fax to [facility]   Call made to [facility] at [facility], made aware I have refaxed order as well ov. She voices thanks and states she will be sending over a standard order sheet once this is received to have signed. I made her aware as soon as we receive this we will have it completed and sent back. Voiced understanding. Nothing further is needed at this time.

## 2019-07-07 NOTE — Telephone Encounter (Signed)
07/06/2019 OV note must be amended to reflect one of the following DX codes AND a new order for nebulizer and medicine must be printed with the same code:  COPD, simple bronchitis, asthma.  Beth please advise on which DX is appropriate for pt and amend the 7/15 OV note, so the new order can be placed.  Thanks!

## 2019-07-07 NOTE — Telephone Encounter (Signed)
You can use COPD, he's a former smoker and I suspect her has underlying disease

## 2019-07-11 ENCOUNTER — Telehealth: Payer: Self-pay | Admitting: Primary Care

## 2019-07-11 MED ORDER — IPRATROPIUM-ALBUTEROL 0.5-2.5 (3) MG/3ML IN SOLN: 3.0000 mL | Freq: Four times a day (QID) | RESPIRATORY_TRACT | 3 refills | Status: AC | PRN

## 2019-07-11 NOTE — Telephone Encounter (Signed)
Called [facility] to see if they had rx. They did not receive. Rx resent to [facility] patient and made aware rx resent.  Then patient asked about nebulizer After looking in referrals made patient aware nebulizer and duoneb will be coming from [place] via mail. Pt then asked about Pleurix drain bottles made aware that they too will be coming from [facility]. Nothing further needed.

## 2019-07-12 ENCOUNTER — Other Ambulatory Visit: Payer: Self-pay

## 2019-07-12 ENCOUNTER — Other Ambulatory Visit: Payer: Self-pay | Admitting: Cardiothoracic Surgery

## 2019-07-12 ENCOUNTER — Inpatient Hospital Stay: Payer: Medicare Other

## 2019-07-12 DIAGNOSIS — C450 Mesothelioma of pleura: Secondary | ICD-10-CM | POA: Diagnosis not present

## 2019-07-12 DIAGNOSIS — J90 Pleural effusion, not elsewhere classified: Secondary | ICD-10-CM

## 2019-07-12 LAB — CBC WITH DIFFERENTIAL (CANCER CENTER ONLY)
Abs Immature Granulocytes: 0.02 10*3/uL (ref 0.00–0.07)
Basophils Absolute: 0 10*3/uL (ref 0.0–0.1)
Basophils Relative: 0 %
Eosinophils Absolute: 0.1 10*3/uL (ref 0.0–0.5)
Eosinophils Relative: 3 %
HCT: 42.1 % (ref 39.0–52.0)
Hemoglobin: 13.3 g/dL (ref 13.0–17.0)
Immature Granulocytes: 1 %
Lymphocytes Relative: 30 %
Lymphs Abs: 1.1 10*3/uL (ref 0.7–4.0)
MCH: 30.2 pg (ref 26.0–34.0)
MCHC: 31.6 g/dL (ref 30.0–36.0)
MCV: 95.5 fL (ref 80.0–100.0)
Monocytes Absolute: 0.6 10*3/uL (ref 0.1–1.0)
Monocytes Relative: 18 %
Neutro Abs: 1.8 10*3/uL (ref 1.7–7.7)
Neutrophils Relative %: 48 %
Platelet Count: 122 10*3/uL — ABNORMAL LOW (ref 150–400)
RBC: 4.41 MIL/uL (ref 4.22–5.81)
RDW: 14.7 % (ref 11.5–15.5)
WBC Count: 3.6 10*3/uL — ABNORMAL LOW (ref 4.0–10.5)
nRBC: 0 % (ref 0.0–0.2)

## 2019-07-12 LAB — CMP (CANCER CENTER ONLY)
ALT: 13 U/L (ref 0–44)
AST: 20 U/L (ref 15–41)
Albumin: 3.5 g/dL (ref 3.5–5.0)
Alkaline Phosphatase: 69 U/L (ref 38–126)
Anion gap: 11 (ref 5–15)
BUN: 20 mg/dL (ref 8–23)
CO2: 27 mmol/L (ref 22–32)
Calcium: 9.6 mg/dL (ref 8.9–10.3)
Chloride: 105 mmol/L (ref 98–111)
Creatinine: 1.18 mg/dL (ref 0.61–1.24)
GFR, Est AFR Am: >60 mL/min (ref >60)
GFR, Estimated: 57 mL/min — ABNORMAL LOW (ref >60)
Glucose, Bld: 116 mg/dL — ABNORMAL HIGH (ref 70–99)
Potassium: 4 mmol/L (ref 3.5–5.1)
Sodium: 143 mmol/L (ref 135–145)
Total Bilirubin: 0.4 mg/dL (ref 0.3–1.2)
Total Protein: 7.2 g/dL (ref 6.5–8.1)

## 2019-07-13 ENCOUNTER — Ambulatory Visit (INDEPENDENT_AMBULATORY_CARE_PROVIDER_SITE_OTHER): Payer: Self-pay | Admitting: Cardiothoracic Surgery

## 2019-07-13 ENCOUNTER — Telehealth: Payer: Self-pay | Admitting: Primary Care

## 2019-07-13 ENCOUNTER — Other Ambulatory Visit: Payer: Self-pay

## 2019-07-13 ENCOUNTER — Ambulatory Visit
Admission: RE | Admit: 2019-07-13 | Discharge: 2019-07-13 | Disposition: A | Discharge status: Home / Self Care | Payer: Medicare Other | Source: Ambulatory Visit | Attending: Cardiothoracic Surgery | Admitting: Cardiothoracic Surgery

## 2019-07-13 ENCOUNTER — Encounter: Payer: Self-pay | Admitting: Cardiothoracic Surgery

## 2019-07-13 VITALS — BP 130/60 | HR 84 | Temp 97.7°F | Resp 16 | Ht 72.0 in | Wt 204.0 lb

## 2019-07-13 DIAGNOSIS — J90 Pleural effusion, not elsewhere classified: Secondary | ICD-10-CM

## 2019-07-13 DIAGNOSIS — Z9689 Presence of other specified functional implants: Secondary | ICD-10-CM

## 2019-07-13 DIAGNOSIS — C450 Mesothelioma of pleura: Secondary | ICD-10-CM

## 2019-07-13 NOTE — Progress Notes (Signed)
PCP is [name], MD Referring Provider is Rigoberto Noel, MD  Chief Complaint  Patient presents with  . Pleural Effusion    f/u with chest xray, R PleurX placement 05/26/19.[name] KitchenMarland Kitchenpresently MONDAY/THURSDAY DRAINAGE.[name] KitchenSTARTED CHEMO LAST WEEK  . Routine Post Op    R VATS/PL BX 05/26/19    HPI: 1 month follow-up for right Pleurx catheter placed for right malignant effusion-mesothelioma.  Patient receiving chemotherapy from Dr. Earlie Server.  Interventional radiology placed a right Port-A-Cath.  Pleurx drainage has been 300-350 cc per session on Mondays-Thursdays.  Patient denies shortness of breath.  Chest x-ray taken today shows minimal loculated right pleural effusion with Pleurx catheter in good position.  Previous PET/CT scan reviewed showing a thin rim of hypermetabolic activity from the right pleural mesothelioma.   Past Medical History:  Diagnosis Date  . Anxiety   . Aortic stenosis    mild AS by 07/20/13 echo ([facility])  . Arthritis    "all over"  . Coronary artery disease    NSTEMI 06/2010, CABG x3=> [place]->LAD, SVG->OM1, SVG->PDA, DES LCx 10/2011, DES LAD 12/2011  . Depression   . GERD (gastroesophageal reflux disease)   . H/O hiatal hernia   . Hearing aid worn    B/L  . High cholesterol   . History of blood transfusion reaction Mar 11, 2013   "he almost died; he has to get irradiated blood next time"  . Hypertension   . Hypothyroidism   . Ischemic cardiomyopathy   . ITP (idiopathic thrombocytopenic purpura)    Dr. Gaynelle Arabian, [place]  . Myocardial infarction ([place]) 11-Mar-2010  . Pneumonia 1940's X 1; 03/11/14 X 1  . Rheumatoid arthritis ([place])   . Shortness of breath    with exertion, has not been very active  . Sleep apnea   . Wears glasses   . Wears partial dentures     Past Surgical History:  Procedure Laterality Date  . APPENDECTOMY    . BACK SURGERY    . CATARACT EXTRACTION, BILATERAL    . CHEST TUBE INSERTION Right 05/20/2019   Procedure: INSERTION PLEURAL  DRAINAGE CATHETER;  Surgeon: Ivin Poot, MD;  Location: [place];  Service: Thoracic;  Laterality: Right;  . COLONOSCOPY    . CORONARY ANGIOPLASTY WITH STENT PLACEMENT     DES Lcx 10/2011, DES LAD 12/2011  . CORONARY ARTERY BYPASS GRAFT  Mar 11, 2010   "CABG X3"  . CORONARY STENT INTERVENTION N/A 05/26/2019   Procedure: CORONARY STENT INTERVENTION;  Surgeon: Nelva Bush, MD;  Location: [facility];  Service: Cardiovascular;  Laterality: N/A;  . GASTROC RECESSION EXTREMITY Right 07/06/2015   Pasty Spillers (orthopedics- [place], [place])  . HERNIA REPAIR    . HIATAL HERNIA REPAIR    . IR IMAGING GUIDED PORT INSERTION  06/29/2019  . KNEE ARTHROSCOPY Left 06/22/2014   w/I&D  . KNEE ARTHROSCOPY Left 06/22/2014   Procedure: IRRIGATION AND DEBRIDEMENT WITH CHRONDROPLASTY;  Surgeon: Hessie Dibble, MD;  Location: [facility];  Service: Orthopedics;  Laterality: Left;  . LEFT HEART CATH AND CORS/GRAFTS ANGIOGRAPHY N/A 05/26/2019   Procedure: LEFT HEART CATH AND CORS/GRAFTS ANGIOGRAPHY;  Surgeon: Nelva Bush, MD;  Location: [facility];  Service: Cardiovascular;  Laterality: N/A;  . LUMBAR DISC SURGERY  1960's?  . MULTIPLE TOOTH EXTRACTIONS    . PLEURAL BIOPSY Right 05/20/2019   Procedure: PLEURAL BIOPSY;  Surgeon: Ivin Poot, MD;  Location: [place];  Service: Thoracic;  Laterality: Right;  . SHOULDER ARTHROSCOPY Right 04/09/2017  Procedure: ARTHROSCOPY SHOULDER;  Surgeon: Melrose Nakayama, MD;  Location: [facility];  Service: Orthopedics;  Laterality: Right;  . STERNAL CLOSURE     "wires from OHS taken out; plate put in" (0/04/3975)  . SYNOVECTOMY Left 08/17/2014   Procedure: SYNOVECTOMY LEFT KNEE;  Surgeon: Hessie Dibble, MD;  Location: [facility];  Service: Orthopedics;  Laterality: Left;  . TOTAL ANKLE ARTHROPLASTY Right 07/06/2015   Pasty Spillers (orthopedics- [facility])  . TOTAL HIP ARTHROPLASTY Right 06/29/2018   Procedure: RIGHT TOTAL HIP ARTHROPLASTY ANTERIOR APPROACH;  Surgeon: Melrose Nakayama, MD;   Location: [facility];  Service: Orthopedics;  Laterality: Right;  . TOTAL HIP ARTHROPLASTY Left 06/09/2016   Scott Streater Claiborne Billings (orthopedics- [place])  . TOTAL KNEE ARTHROPLASTY Left 11/24/2014   Procedure: TOTAL KNEE ARTHROPLASTY;  Surgeon: Hessie Dibble, MD;  Location: [place];  Service: Orthopedics;  Laterality: Left;  [name] Kitchen VIDEO ASSISTED THORACOSCOPY Right 05/20/2019   Procedure: VIDEO ASSISTED THORACOSCOPY;  Surgeon: Ivin Poot, MD;  [facility]: [facility];  Service: Thoracic;  Laterality: Right;    Family History  Problem Relation Age of Onset  . Heart disease Other   . Arthritis Other   . Heart disease Mother   . Alzheimer's disease Father   . Rheum arthritis Sister   . Rheum arthritis Brother   . Healthy Son     Social History Social History   Tobacco Use  . Smoking status: Former Smoker    Packs/day: 1.00    Years: 15.00    Pack years: 15.00    Types: Cigarettes    Quit date: 12/22/1966    Years since quitting: 52.5  . Smokeless tobacco: Former Systems developer    Types: Chew  . Tobacco comment: "quit smoking ~ late ~ 60's; quit chewing in the 1970's"  Substance Use Topics  . Alcohol use: No  . Drug use: No    Current Outpatient Medications  Medication Sig Dispense Refill  . ALPRAZolam (XANAX) 0.25 MG tablet Take 1 tablet (0.25 mg total) by mouth 2 (two) times daily as needed for anxiety. 20 tablet 0  . aspirin EC 81 MG tablet Take 1 tablet (81 mg total) by mouth daily. 30 tablet 2  . carbidopa-levodopa (SINEMET IR) 25-100 MG tablet Take 1 tablet by mouth 3 (three) times daily. 270 tablet 1  . chlorproMAZINE (THORAZINE) 25 MG tablet Take 1 tablet (25 mg total) by mouth 3 (three) times daily. 30 tablet 0  . dexamethasone (DECADRON) 4 MG tablet 4 mg p.o. twice daily the day before, day of and day after chemotherapy every 3 weeks 40 tablet 1  . Fluticasone-Salmeterol (ADVAIR) 100-50 MCG/DOSE AEPB Inhale 1 puff into the lungs daily.    . folic acid (FOLVITE) 1 MG tablet Take 1  tablet (1 mg total) by mouth daily. 30 tablet 4  . ipratropium-albuterol (DUONEB) 0.5-2.5 (3) MG/3ML SOLN Take 3 mLs by nebulization every 6 (six) hours as needed. J44.9 360 mL 3  . levothyroxine (SYNTHROID, LEVOTHROID) 200 MCG tablet Take 200 mcg by mouth at bedtime.     . lidocaine-prilocaine (EMLA) cream Apply 1 application topically as needed. 30 g 0  . losartan (COZAAR) 50 MG tablet Take 50 mg by mouth daily.    . methotrexate (RHEUMATREX) 2.5 MG tablet Take 15 mg by mouth once a week. Caution:Chemotherapy. Protect from light.    . mirtazapine (REMERON) 15 MG tablet Take 15 mg by mouth at bedtime.    . Omega-3 Fatty Acids (FISH OIL) 1200  MG CAPS Take 1,200 mg by mouth at bedtime. 360 MG OMEGA-3    . omeprazole (PRILOSEC) 40 MG capsule Take 40 mg by mouth at bedtime.     . predniSONE (DELTASONE) 10 MG tablet Take 10 mg by mouth daily with breakfast.    . prochlorperazine (COMPAZINE) 10 MG tablet Take 1 tablet (10 mg total) by mouth every 6 (six) hours as needed for nausea or vomiting. 30 tablet 0  . rOPINIRole (REQUIP) 0.5 MG tablet Take 0.5 mg by mouth at bedtime.    [name] Kitchen Spacer/Aero-Holding Chambers (AEROCHAMBER MV) inhaler Use as instructed 1 each 0  . ticagrelor (BRILINTA) 90 MG TABS tablet Take 1 tablet (90 mg total) by mouth 2 (two) times daily. 60 tablet 2  . Vitamin D, Ergocalciferol, (DRISDOL) 1.25 MG (50000 UT) CAPS capsule Take 50,000 Units by mouth every 7 (seven) days.     No current facility-administered medications for this visit.     Allergies  Allergen Reactions  . Lipitor [Atorvastatin] Other (See Comments)    muscle spasms cramps  . Methylprednisolone Other (See Comments)    cramps cramps  . Morphine And Related     Hallucinations at times  . Other     If patient is to receive blood - blood must be treated witgh radiation because his body will not accept a normal infusion   . Sulfa Antibiotics Itching  . Doxycycline Rash    Doxycycline caused itching redness on  scalp and head Doxycycline caused itching redness on scalp and head    Review of Systems   Maintaining weight Breathing comfortably with drainage of right pleural effusion Has not needed oxygen  BP 130/60 (BP Location: Right Arm, Patient Position: Sitting, Cuff Size: Normal)   Pulse 84   Temp 97.7 F (36.5 C) Comment: THERMAL  Resp 16   Ht 6' (1.829 m)   Wt 204 lb (92.5 kg)   SpO2 93% Comment: RA  BMI 27.67 kg/m  Physical Exam .      Exam    General- alert and comfortable    Neck- no JVD, no cervical adenopathy palpable, no carotid bruit   Lungs- clear without rales, wheezes    Right Pleurx catheter dressing clean and dry   Cor- regular rate and rhythm, no murmur , gallop   Abdomen- soft, non-tender   Extremities - warm, non-tender, minimal edema   Neuro- oriented, appropriate, no focal weakness  Diagnostic Tests: Today's chest x-ray image personally reviewed showing minimal right pleural effusion after drainage 2 days ago.  Impression: Pleurx catheter functioning well to prevent shortness of breath.  Continue Monday-Thursday drainage schedule. Return with chest x-ray in 1 month. Plan: Continue twice weekly drainage schedule and return in 1 month with chest x-ray.  Len Childs, MD [facility] [phone]

## 2019-07-13 NOTE — Telephone Encounter (Signed)
[facility] please resend order for Pleurix drain bottles. Per Cecille Rubin at [facility] they don't have order.   There was an issue with processing neb order so a 2nd referral was entered. They will submit and contact patient tomorrow. Pt contacted and notified of all of the above. Medication is ready for p/u.

## 2019-07-14 NOTE — Telephone Encounter (Signed)
Reprinted these orders and refaxed to [facility]

## 2019-07-19 ENCOUNTER — Inpatient Hospital Stay (HOSPITAL_BASED_OUTPATIENT_CLINIC_OR_DEPARTMENT_OTHER): Payer: Medicare Other | Admitting: Internal Medicine

## 2019-07-19 ENCOUNTER — Inpatient Hospital Stay: Payer: Medicare Other

## 2019-07-19 ENCOUNTER — Encounter: Payer: Self-pay | Admitting: Internal Medicine

## 2019-07-19 ENCOUNTER — Other Ambulatory Visit: Payer: Self-pay

## 2019-07-19 VITALS — BP 149/80 | HR 77 | Temp 98.7°F | Resp 20 | Ht 72.0 in | Wt 201.8 lb

## 2019-07-19 DIAGNOSIS — J90 Pleural effusion, not elsewhere classified: Secondary | ICD-10-CM

## 2019-07-19 DIAGNOSIS — I255 Ischemic cardiomyopathy: Secondary | ICD-10-CM

## 2019-07-19 DIAGNOSIS — I10 Essential (primary) hypertension: Secondary | ICD-10-CM

## 2019-07-19 DIAGNOSIS — D693 Immune thrombocytopenic purpura: Secondary | ICD-10-CM

## 2019-07-19 DIAGNOSIS — C450 Mesothelioma of pleura: Secondary | ICD-10-CM | POA: Diagnosis not present

## 2019-07-19 DIAGNOSIS — E7800 Pure hypercholesterolemia, unspecified: Secondary | ICD-10-CM

## 2019-07-19 DIAGNOSIS — F329 Major depressive disorder, single episode, unspecified: Secondary | ICD-10-CM

## 2019-07-19 DIAGNOSIS — I350 Nonrheumatic aortic (valve) stenosis: Secondary | ICD-10-CM

## 2019-07-19 DIAGNOSIS — Z5111 Encounter for antineoplastic chemotherapy: Secondary | ICD-10-CM

## 2019-07-19 DIAGNOSIS — G4730 Sleep apnea, unspecified: Secondary | ICD-10-CM

## 2019-07-19 DIAGNOSIS — I2510 Atherosclerotic heart disease of native coronary artery without angina pectoris: Secondary | ICD-10-CM

## 2019-07-19 DIAGNOSIS — I714 Abdominal aortic aneurysm, without rupture: Secondary | ICD-10-CM

## 2019-07-19 DIAGNOSIS — E039 Hypothyroidism, unspecified: Secondary | ICD-10-CM

## 2019-07-19 DIAGNOSIS — I252 Old myocardial infarction: Secondary | ICD-10-CM

## 2019-07-19 DIAGNOSIS — I700 Atherosclerosis of aorta: Secondary | ICD-10-CM

## 2019-07-19 DIAGNOSIS — K219 Gastro-esophageal reflux disease without esophagitis: Secondary | ICD-10-CM

## 2019-07-19 DIAGNOSIS — Z79899 Other long term (current) drug therapy: Secondary | ICD-10-CM

## 2019-07-19 DIAGNOSIS — Z7982 Long term (current) use of aspirin: Secondary | ICD-10-CM

## 2019-07-19 DIAGNOSIS — M069 Rheumatoid arthritis, unspecified: Secondary | ICD-10-CM

## 2019-07-19 LAB — CMP (CANCER CENTER ONLY)
ALT: 25 U/L (ref 0–44)
AST: 15 U/L (ref 15–41)
Albumin: 3.5 g/dL (ref 3.5–5.0)
Alkaline Phosphatase: 69 U/L (ref 38–126)
Anion gap: 11 (ref 5–15)
BUN: 22 mg/dL (ref 8–23)
CO2: 25 mmol/L (ref 22–32)
Calcium: 9.6 mg/dL (ref 8.9–10.3)
Chloride: 104 mmol/L (ref 98–111)
Creatinine: 1.16 mg/dL (ref 0.61–1.24)
GFR, Est AFR Am: >60 mL/min (ref >60)
GFR, Estimated: 58 mL/min — ABNORMAL LOW (ref >60)
Glucose, Bld: 156 mg/dL — ABNORMAL HIGH (ref 70–99)
Potassium: 4.6 mmol/L (ref 3.5–5.1)
Sodium: 140 mmol/L (ref 135–145)
Total Bilirubin: 0.2 mg/dL — ABNORMAL LOW (ref 0.3–1.2)
Total Protein: 7.4 g/dL (ref 6.5–8.1)

## 2019-07-19 LAB — CBC WITH DIFFERENTIAL (CANCER CENTER ONLY)
Abs Immature Granulocytes: 0.3 10*3/uL — ABNORMAL HIGH (ref 0.00–0.07)
Basophils Absolute: 0 10*3/uL (ref 0.0–0.1)
Basophils Relative: 1 %
Eosinophils Absolute: 0 10*3/uL (ref 0.0–0.5)
Eosinophils Relative: 0 %
HCT: 39.1 % (ref 39.0–52.0)
Hemoglobin: 12.8 g/dL — ABNORMAL LOW (ref 13.0–17.0)
Immature Granulocytes: 4 %
Lymphocytes Relative: 18 %
Lymphs Abs: 1.3 10*3/uL (ref 0.7–4.0)
MCH: 30.3 pg (ref 26.0–34.0)
MCHC: 32.7 g/dL (ref 30.0–36.0)
MCV: 92.7 fL (ref 80.0–100.0)
Monocytes Absolute: 1.1 10*3/uL — ABNORMAL HIGH (ref 0.1–1.0)
Monocytes Relative: 16 %
Neutro Abs: 4.3 10*3/uL (ref 1.7–7.7)
Neutrophils Relative %: 61 %
Platelet Count: 316 10*3/uL (ref 150–400)
RBC: 4.22 MIL/uL (ref 4.22–5.81)
RDW: 15.4 % (ref 11.5–15.5)
WBC Count: 7 10*3/uL (ref 4.0–10.5)
nRBC: 0 % (ref 0.0–0.2)

## 2019-07-19 MED ORDER — SODIUM CHLORIDE 0.9% FLUSH
10.0000 mL | INTRAVENOUS | Status: DC | PRN
  Administered 2019-07-19: 15:00:00 10 mL
  Filled 2019-07-19: qty 10

## 2019-07-19 MED ORDER — SODIUM CHLORIDE 0.9 % IV SOLN
Freq: Once | INTRAVENOUS | Status: AC
  Administered 2019-07-19: 13:00:00 via INTRAVENOUS
  Filled 2019-07-19: qty 250

## 2019-07-19 MED ORDER — SODIUM CHLORIDE 0.9 % IV SOLN
435.0000 mg | Freq: Once | INTRAVENOUS | Status: AC
  Administered 2019-07-19: 440 mg via INTRAVENOUS
  Filled 2019-07-19: qty 44

## 2019-07-19 MED ORDER — PALONOSETRON HCL INJECTION 0.25 MG/5ML
INTRAVENOUS | Status: AC
  Filled 2019-07-19: qty 5

## 2019-07-19 MED ORDER — SODIUM CHLORIDE 0.9 % IV SOLN
510.0000 mg/m2 | Freq: Once | INTRAVENOUS | Status: AC
  Administered 2019-07-19: 1100 mg via INTRAVENOUS
  Filled 2019-07-19: qty 4

## 2019-07-19 MED ORDER — SODIUM CHLORIDE 0.9 % IV SOLN
Freq: Once | INTRAVENOUS | Status: AC
  Administered 2019-07-19: 13:00:00 via INTRAVENOUS
  Filled 2019-07-19: qty 5

## 2019-07-19 MED ORDER — PALONOSETRON HCL INJECTION 0.25 MG/5ML
0.2500 mg | Freq: Once | INTRAVENOUS | Status: AC
  Administered 2019-07-19: 0.25 mg via INTRAVENOUS

## 2019-07-19 MED ORDER — HEPARIN SOD (PORK) LOCK FLUSH 100 UNIT/ML IV SOLN
500.0000 [IU] | Freq: Once | INTRAVENOUS | Status: AC | PRN
  Administered 2019-07-19: 15:00:00 500 [IU]
  Filled 2019-07-19: qty 5

## 2019-07-19 NOTE — Progress Notes (Signed)
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  PRIOR THERAPY: None.  CURRENT THERAPY: Systemic chemotherapy with carboplatin for AUC of 5 and Alimta 500 mg/M2 every 3 weeks.  Status post 1 cycle.  INTERVAL HISTORY: Jeffrey [name] 85 y.o. male returns to the clinic today for follow-up visit.  The patient is feeling fine today with no concerning complaints except for mild fatigue and mild shortness of breath with exertion.  He denied having any chest pain, cough or hemoptysis.  He has no significant weight loss or night sweats.  He has no nausea, vomiting, diarrhea or constipation.  He has no headache or visual changes.  He is here today for evaluation before starting cycle #2 of his treatment.   MEDICAL HISTORY: Past Medical History:  Diagnosis Date   Anxiety    Aortic stenosis    mild AS by 07/20/13 echo ([facility])   Arthritis    "all over"   Coronary artery disease    NSTEMI 06/2010, CABG x3=> [place]->LAD, SVG->OM1, SVG->PDA, DES LCx 10/2011, DES LAD 12/2011   Depression    GERD (gastroesophageal reflux disease)    H/O hiatal hernia    Hearing aid worn    B/L   High cholesterol    History of blood transfusion reaction 04-Mar-2013   "he almost died; he has to get irradiated blood next time"   Hypertension    Hypothyroidism    Ischemic cardiomyopathy    ITP (idiopathic thrombocytopenic purpura)    Dr. Gaynelle Arabian, on Promacta   Myocardial infarction [facility]) 03/04/10   Pneumonia 1940's X 1; 03-04-14 X 1   Rheumatoid arthritis ([place])    Shortness of breath    with exertion, has not been very active   Sleep apnea    Wears glasses    Wears partial  dentures     ALLERGIES:  is allergic to lipitor [atorvastatin]; methylprednisolone; morphine and related; other; sulfa antibiotics; and doxycycline.  MEDICATIONS:  Current Outpatient Medications  Medication Sig Dispense Refill   ALPRAZolam (XANAX) 0.25 MG tablet Take 1 tablet (0.25 mg total) by mouth 2 (two) times daily as needed for anxiety. 20 tablet 0   aspirin EC 81 MG tablet Take 1 tablet (81 mg total) by mouth daily. 30 tablet 2   carbidopa-levodopa (SINEMET IR) 25-100 MG tablet Take 1 tablet by mouth 3 (three) times daily. 270 tablet 1   chlorproMAZINE (THORAZINE) 25 MG tablet Take 1 tablet (25 mg total) by mouth 3 (three) times daily. 30 tablet 0   dexamethasone (DECADRON) 4 MG tablet 4 mg p.o. twice daily the day before, day of and day after chemotherapy every 3 weeks 40 tablet 1   Fluticasone-Salmeterol (ADVAIR) 100-50 MCG/DOSE AEPB Inhale 1 puff into the lungs daily.     folic acid (FOLVITE) 1 MG tablet Take 1 tablet (1 mg total) by mouth daily. 30 tablet 4   ipratropium-albuterol (DUONEB) 0.5-2.5 (3) MG/3ML SOLN Take 3 mLs by nebulization every 6 (six) hours as needed. J44.9 360 mL 3   levothyroxine (SYNTHROID, LEVOTHROID) 200 MCG tablet Take 200 mcg by mouth at bedtime.      lidocaine-prilocaine (EMLA) cream Apply 1 application topically as needed. 30 g 0   losartan (COZAAR)  50 MG tablet Take 50 mg by mouth daily.     methotrexate (RHEUMATREX) 2.5 MG tablet Take 15 mg by mouth once a week. Caution:Chemotherapy. Protect from light.     mirtazapine (REMERON) 15 MG tablet Take 15 mg by mouth at bedtime.     Omega-3 Fatty Acids (FISH OIL) 1200 MG CAPS Take 1,200 mg by mouth at bedtime. 360 MG OMEGA-3     omeprazole (PRILOSEC) 40 MG capsule Take 40 mg by mouth at bedtime.      predniSONE (DELTASONE) 10 MG tablet Take 10 mg by mouth daily with breakfast.     prochlorperazine (COMPAZINE) 10 MG tablet Take 1 tablet (10 mg total) by mouth every 6 (six) hours as needed for  nausea or vomiting. 30 tablet 0   rOPINIRole (REQUIP) 0.5 MG tablet Take 0.5 mg by mouth at bedtime.     Spacer/Aero-Holding Chambers (AEROCHAMBER MV) inhaler Use as instructed 1 each 0   ticagrelor (BRILINTA) 90 MG TABS tablet Take 1 tablet (90 mg total) by mouth 2 (two) times daily. 60 tablet 2   Vitamin D, Ergocalciferol, (DRISDOL) 1.25 MG (50000 UT) CAPS capsule Take 50,000 Units by mouth every 7 (seven) days.     No current facility-administered medications for this visit.     SURGICAL HISTORY:  Past Surgical History:  Procedure Laterality Date   APPENDECTOMY     BACK SURGERY     CATARACT EXTRACTION, BILATERAL     CHEST TUBE INSERTION Right 05/20/2019   Procedure: INSERTION PLEURAL DRAINAGE CATHETER;  Surgeon: Ivin Poot, MD;  Location: [facility];  Service: Thoracic;  Laterality: Right;   COLONOSCOPY     CORONARY ANGIOPLASTY WITH STENT PLACEMENT     DES Lcx 10/2011, DES LAD 12/2011   CORONARY ARTERY BYPASS GRAFT  2011   "CABG X3"   CORONARY STENT INTERVENTION N/A 05/26/2019   Procedure: CORONARY STENT INTERVENTION;  Surgeon: Nelva Bush, MD;  Location: [facility];  Service: Cardiovascular;  Laterality: N/A;   GASTROC RECESSION EXTREMITY Right 07/06/2015   Pasty Spillers (orthopedics- [place], [place])   HERNIA REPAIR     HIATAL HERNIA REPAIR     IR IMAGING GUIDED PORT INSERTION  06/29/2019   KNEE ARTHROSCOPY Left 06/22/2014   w/I&D   KNEE ARTHROSCOPY Left 06/22/2014   Procedure: IRRIGATION AND DEBRIDEMENT WITH CHRONDROPLASTY;  Surgeon: Hessie Dibble, MD;  Location: [facility];  Service: Orthopedics;  Laterality: Left;   LEFT HEART CATH AND CORS/GRAFTS ANGIOGRAPHY N/A 05/26/2019   Procedure: LEFT HEART CATH AND CORS/GRAFTS ANGIOGRAPHY;  Surgeon: Nelva Bush, MD;  Location: [facility];  Service: Cardiovascular;  Laterality: N/A;   LUMBAR DISC SURGERY  1960's?   MULTIPLE TOOTH EXTRACTIONS     PLEURAL BIOPSY Right 05/20/2019   Procedure: PLEURAL  BIOPSY;  Surgeon: Ivin Poot, MD;  Location: [facility];  Service: Thoracic;  Laterality: Right;   SHOULDER ARTHROSCOPY Right 04/09/2017   Procedure: ARTHROSCOPY SHOULDER;  Surgeon: Melrose Nakayama, MD;  Location: [facility];  Service: Orthopedics;  Laterality: Right;   STERNAL CLOSURE     "wires from OHS taken out; plate put in" (03/27/2702)   SYNOVECTOMY Left 08/17/2014   Procedure: SYNOVECTOMY LEFT KNEE;  Surgeon: Hessie Dibble, MD;  Location: [facility];  Service: Orthopedics;  Laterality: Left;   TOTAL ANKLE ARTHROPLASTY Right 07/06/2015   Pasty Spillers (orthopedics- [facility] [place])   TOTAL HIP ARTHROPLASTY Right 06/29/2018   Procedure: RIGHT TOTAL HIP ARTHROPLASTY ANTERIOR APPROACH;  Surgeon: Melrose Nakayama,  MD;  Location: [facility];  Service: Orthopedics;  Laterality: Right;   TOTAL HIP ARTHROPLASTY Left 06/09/2016   Nicki Reaper Streater Claiborne Billings (orthopedics- [facility])   TOTAL KNEE ARTHROPLASTY Left 11/24/2014   Procedure: TOTAL KNEE ARTHROPLASTY;  Surgeon: Hessie Dibble, MD;  Location: [facility];  Service: Orthopedics;  Laterality: Left;   VIDEO ASSISTED THORACOSCOPY Right 05/20/2019   Procedure: VIDEO ASSISTED THORACOSCOPY;  Surgeon: Prescott Gum, Collier Salina, MD;  Location: [facility];  Service: Thoracic;  Laterality: Right;    REVIEW OF SYSTEMS:  A comprehensive review of systems was negative except for: Constitutional: positive for fatigue Respiratory: positive for dyspnea on exertion   PHYSICAL EXAMINATION: General appearance: alert, cooperative, fatigued and no distress Head: Normocephalic, without obvious abnormality, atraumatic Neck: no adenopathy, no JVD, supple, symmetrical, trachea midline and thyroid not enlarged, symmetric, no tenderness/mass/nodules Lymph nodes: Cervical, supraclavicular, and axillary nodes normal. Resp: clear to auscultation bilaterally Back: symmetric, no curvature. ROM normal. No CVA tenderness. Cardio: systolic murmur: systolic ejection 3/6, harsh at 2nd left intercostal space GI:  soft, non-tender; bowel sounds normal; no masses,  no organomegaly Extremities: extremities normal, atraumatic, no cyanosis or edema  ECOG PERFORMANCE STATUS: 1 - Symptomatic but completely ambulatory  Blood pressure (!) 149/80, pulse 77, temperature 98.7 F (37.1 C), temperature source Oral, resp. rate 20, height 6' (1.829 m), weight 201 lb 12.8 oz (91.5 kg), SpO2 98 %.  LABORATORY DATA: Lab Results  Component Value Date   WBC 7.0 07/19/2019   HGB 12.8 (L) 07/19/2019   HCT 39.1 07/19/2019   MCV 92.7 07/19/2019   PLT 316 07/19/2019      Chemistry      Component Value Date/Time   NA 143 07/12/2019 1341   K 4.0 07/12/2019 1341   CL 105 07/12/2019 1341   CO2 27 07/12/2019 1341   BUN 20 07/12/2019 1341   CREATININE 1.18 07/12/2019 1341   CREATININE 1.79 (H) 10/12/2014 1136      Component Value Date/Time   CALCIUM 9.6 07/12/2019 1341   ALKPHOS 69 07/12/2019 1341   AST 20 07/12/2019 1341   ALT 13 07/12/2019 1341   BILITOT 0.4 07/12/2019 1341       RADIOGRAPHIC STUDIES: Dg Chest 2 View  Result Date: 07/13/2019 CLINICAL DATA:  Right pleural effusion EXAM: CHEST - 2 VIEW COMPARISON:  06/13/2019 FINDINGS: Right PleurX catheter in place. There is small to moderate right pleural effusion, stable since prior study. Right basilar opacity is stable. Left lung clear. Heart is normal size. Mild tortuosity of the thoracic aorta with calcifications. Right Port-A-Cath remains in place, unchanged. IMPRESSION: Stable right pleural effusion and right basilar opacity, likely atelectasis. Right PleurX catheter in place. No pneumothorax. Electronically Signed   By: Rolm Baptise M.D.   On: 07/13/2019 12:53   Nm Pet Image Initial (pi) Skull Base To Thigh  Result Date: 06/30/2019 CLINICAL DATA:  Initial treatment strategy for malignant mesothelioma of the pleura. EXAM: NUCLEAR MEDICINE PET SKULL BASE TO THIGH TECHNIQUE: 10.9 mCi F-18 FDG was injected intravenously. Full-ring PET imaging was performed  from the skull base to thigh after the radiotracer. CT data was obtained and used for attenuation correction and anatomic localization. Fasting blood glucose: 97 mg/dl COMPARISON:  CT chest 05/23/2019 FINDINGS: Mediastinal blood pool activity: SUV max 3.45 Liver activity: SUV max NA NECK: No hypermetabolic lymph nodes in the neck. Incidental CT findings: none CHEST: No FDG avid supraclavicular or axillary lymph nodes. 7 mm high left paratracheal lymph node is identified within  SUV max of 4.26. Subcarinal lymph node measures 1.2 cm and has an SUV max of 5.5. Right hilar lymph node measures 1 cm and has an SUV max of 5.5. There is a small loculated right pleural effusion overlying the right lower lobe. Right-sided chest tube is in place. Posterior and medial pleural calcifications are again noted. Thin rind of soft tissue encasing the entire right lung is identified which is FDG avid compatible with malignant mesothelioma. -Overlying the lateral right lower lobe SUV max from pleural tumor is equal to 7.4. -Anteromedial FDG avid pleural tumor overlying the right middle lobe and right lower lobe has an SUV max of 7.58. -Tumor overlying the lateral right upper lobe has an SUV max of 5.79. -Along the paramediastinal right upper lobe pleural tumor has an SUV max of 6.62. Incidental CT findings: none ABDOMEN/PELVIS: No abnormal hypermetabolic activity within the liver, pancreas, adrenal glands, or spleen. No hypermetabolic lymph nodes in the abdomen or pelvis. Incidental CT findings: Aortic atherosclerosis. Cystic lesion is identified arising from neck of pancreas measuring 1.9 cm and 14 HU. No FDG uptake identified within this likely benign abnormality. Infrarenal abdominal aorta measures 3.1 cm AP. SKELETON: No focal hypermetabolic activity to suggest skeletal metastasis. Incidental CT findings: none IMPRESSION: 1. Thin rind of pleural tumor encasing the right lung is identified and is FDG avid compatible with malignant  mesothelioma. 2. Small loculated right pleural effusion is identified with chest tube in place. 3. Mildly FDG avid right hilar, subcarinal and high left paratracheal lymph nodes which may reflect metastatic adenopathy. 4.  Aortic Atherosclerosis (ICD10-I70.0). 5. Infrarenal abdominal aortic aneurysm. Recommend followup by ultrasound in 3 years. This recommendation follows ACR consensus guidelines: White Paper of the ACR Incidental Findings Committee II on Vascular Findings. J Am Coll Radiol 2013; 10:789-794. Aortic aneurysm NOS (ICD10-I71.9). Electronically Signed   By: Kerby Moors M.D.   On: 06/30/2019 13:05   Ir Imaging Guided Port Insertion  Result Date: 06/29/2019 CLINICAL DATA:  MALIGNANT MESOTHELIOMA OF THE PLEURA EXAM: RIGHT INTERNAL JUGULAR SINGLE LUMEN POWER PORT CATHETER INSERTION Date:  06/29/2019 06/29/2019 3:41 pm Radiologist:  Jerilynn Mages. Daryll Brod, MD Guidance:  Ultrasound and fluoroscopic MEDICATIONS: Ancef 2 g; The antibiotic was administered within an appropriate time interval prior to skin puncture. ANESTHESIA/SEDATION: Versed 2.0 mg IV; Fentanyl 100 mcg IV; Moderate Sedation Time:  25 minutes The patient was continuously monitored during the procedure by the interventional radiology nurse under my direct supervision. FLUOROSCOPY TIME:  0 minutes, 30 seconds (8 mGy) COMPLICATIONS: None immediate. CONTRAST:  None. PROCEDURE: Informed consent was obtained from the patient following explanation of the procedure, risks, benefits and alternatives. The patient understands, agrees and consents for the procedure. All questions were addressed. A time out was performed. Maximal barrier sterile technique utilized including caps, mask, sterile gowns, sterile gloves, large sterile drape, hand hygiene, and 2% chlorhexidine scrub. Under sterile conditions and local anesthesia, right internal jugular micropuncture venous access was performed. Access was performed with ultrasound. Images were obtained for  documentation of the patent right internal jugular vein. A guide wire was inserted followed by a transitional dilator. This allowed insertion of a guide wire and catheter into the IVC. Measurements were obtained from the SVC / RA junction back to the right IJ venotomy site. In the right infraclavicular chest, a subcutaneous pocket was created over the second anterior rib. This was done under sterile conditions and local anesthesia. 1% lidocaine with epinephrine was utilized for this. A 2.5 cm incision was  made in the skin. Blunt dissection was performed to create a subcutaneous pocket over the right pectoralis major muscle. The pocket was flushed with saline vigorously. There was adequate hemostasis. The port catheter was assembled and checked for leakage. The port catheter was secured in the pocket with two retention sutures. The tubing was tunneled subcutaneously to the right venotomy site and inserted into the SVC/RA junction through a valved peel-away sheath. Position was confirmed with fluoroscopy. Images were obtained for documentation. The patient tolerated the procedure well. No immediate complications. Incisions were closed in a two layer fashion with 4 - 0 Vicryl suture. Dermabond was applied to the skin. The port catheter was accessed, blood was aspirated followed by saline and heparin flushes. Needle was removed. A dry sterile dressing was applied. IMPRESSION: Ultrasound and fluoroscopically guided right internal jugular single lumen power port catheter insertion. Tip in the SVC/RA junction. Catheter ready for use. Electronically Signed   By: Jerilynn Mages.  Shick M.D.   On: 06/29/2019 15:50    ASSESSMENT AND PLAN: This is a very pleasant 85 years old white male with malignant pleural mesothelioma, epithelioid type involving the right hemithorax. The patient started systemic chemotherapy with carboplatin for AUC of 5 and Alimta 500 mg/M2 status post 1 cycle given last week.   The patient tolerated the first  cycle of his treatment well with no concerning adverse effects. I recommended for him to proceed with cycle #2 today as planned. I will see him back for follow-up visit in 3 weeks for evaluation before starting cycle #3. He was advised to call immediately if he has any concerning symptoms in the interval. The patient voices understanding of current disease status and treatment options and is in agreement with the current care plan.  All questions were answered. The patient knows to call the clinic with any problems, questions or concerns. We can certainly see the patient much sooner if necessary.  I spent 10 minutes counseling the patient face to face. The total time spent in the appointment was 15 minutes.  Disclaimer: This note was dictated with voice recognition software. Similar sounding words can inadvertently be transcribed and may not be corrected upon review.

## 2019-07-26 ENCOUNTER — Inpatient Hospital Stay: Payer: Medicare Other | Attending: Internal Medicine

## 2019-07-26 ENCOUNTER — Other Ambulatory Visit: Payer: Self-pay

## 2019-07-26 DIAGNOSIS — Z5111 Encounter for antineoplastic chemotherapy: Secondary | ICD-10-CM | POA: Diagnosis not present

## 2019-07-26 DIAGNOSIS — D72829 Elevated white blood cell count, unspecified: Secondary | ICD-10-CM | POA: Insufficient documentation

## 2019-07-26 DIAGNOSIS — I2699 Other pulmonary embolism without acute cor pulmonale: Secondary | ICD-10-CM | POA: Diagnosis not present

## 2019-07-26 DIAGNOSIS — Z79899 Other long term (current) drug therapy: Secondary | ICD-10-CM | POA: Diagnosis not present

## 2019-07-26 DIAGNOSIS — I2510 Atherosclerotic heart disease of native coronary artery without angina pectoris: Secondary | ICD-10-CM | POA: Insufficient documentation

## 2019-07-26 DIAGNOSIS — G4730 Sleep apnea, unspecified: Secondary | ICD-10-CM | POA: Diagnosis not present

## 2019-07-26 DIAGNOSIS — M069 Rheumatoid arthritis, unspecified: Secondary | ICD-10-CM | POA: Insufficient documentation

## 2019-07-26 DIAGNOSIS — C450 Mesothelioma of pleura: Secondary | ICD-10-CM | POA: Insufficient documentation

## 2019-07-26 DIAGNOSIS — E7800 Pure hypercholesterolemia, unspecified: Secondary | ICD-10-CM | POA: Insufficient documentation

## 2019-07-26 DIAGNOSIS — N390 Urinary tract infection, site not specified: Secondary | ICD-10-CM | POA: Diagnosis not present

## 2019-07-26 DIAGNOSIS — E039 Hypothyroidism, unspecified: Secondary | ICD-10-CM | POA: Diagnosis not present

## 2019-07-26 DIAGNOSIS — I10 Essential (primary) hypertension: Secondary | ICD-10-CM | POA: Diagnosis not present

## 2019-07-26 DIAGNOSIS — K219 Gastro-esophageal reflux disease without esophagitis: Secondary | ICD-10-CM | POA: Insufficient documentation

## 2019-07-26 DIAGNOSIS — R944 Abnormal results of kidney function studies: Secondary | ICD-10-CM | POA: Diagnosis not present

## 2019-07-26 DIAGNOSIS — F418 Other specified anxiety disorders: Secondary | ICD-10-CM | POA: Insufficient documentation

## 2019-07-26 DIAGNOSIS — Z7901 Long term (current) use of anticoagulants: Secondary | ICD-10-CM | POA: Insufficient documentation

## 2019-07-26 DIAGNOSIS — I255 Ischemic cardiomyopathy: Secondary | ICD-10-CM | POA: Diagnosis not present

## 2019-07-26 LAB — CBC WITH DIFFERENTIAL (CANCER CENTER ONLY)
Abs Immature Granulocytes: 0.07 10*3/uL (ref 0.00–0.07)
Basophils Absolute: 0 10*3/uL (ref 0.0–0.1)
Basophils Relative: 0 %
Eosinophils Absolute: 0.1 10*3/uL (ref 0.0–0.5)
Eosinophils Relative: 2 %
HCT: 39.6 % (ref 39.0–52.0)
Hemoglobin: 12.8 g/dL — ABNORMAL LOW (ref 13.0–17.0)
Immature Granulocytes: 2 %
Lymphocytes Relative: 28 %
Lymphs Abs: 1 10*3/uL (ref 0.7–4.0)
MCH: 30.3 pg (ref 26.0–34.0)
MCHC: 32.3 g/dL (ref 30.0–36.0)
MCV: 93.8 fL (ref 80.0–100.0)
Monocytes Absolute: 0.1 10*3/uL (ref 0.1–1.0)
Monocytes Relative: 4 %
Neutro Abs: 2.2 10*3/uL (ref 1.7–7.7)
Neutrophils Relative %: 64 %
Platelet Count: 230 10*3/uL (ref 150–400)
RBC: 4.22 MIL/uL (ref 4.22–5.81)
RDW: 15.7 % — ABNORMAL HIGH (ref 11.5–15.5)
WBC Count: 3.4 10*3/uL — ABNORMAL LOW (ref 4.0–10.5)
nRBC: 0 % (ref 0.0–0.2)

## 2019-07-26 LAB — CMP (CANCER CENTER ONLY)
ALT: 14 U/L (ref 0–44)
AST: 24 U/L (ref 15–41)
Albumin: 3.4 g/dL — ABNORMAL LOW (ref 3.5–5.0)
Alkaline Phosphatase: 67 U/L (ref 38–126)
Anion gap: 8 (ref 5–15)
BUN: 27 mg/dL — ABNORMAL HIGH (ref 8–23)
CO2: 27 mmol/L (ref 22–32)
Calcium: 9.3 mg/dL (ref 8.9–10.3)
Chloride: 105 mmol/L (ref 98–111)
Creatinine: 1.3 mg/dL — ABNORMAL HIGH (ref 0.61–1.24)
GFR, Est AFR Am: 59 mL/min — ABNORMAL LOW (ref >60)
GFR, Estimated: 51 mL/min — ABNORMAL LOW (ref >60)
Glucose, Bld: 102 mg/dL — ABNORMAL HIGH (ref 70–99)
Potassium: 4.7 mmol/L (ref 3.5–5.1)
Sodium: 140 mmol/L (ref 135–145)
Total Bilirubin: 0.5 mg/dL (ref 0.3–1.2)
Total Protein: 6.8 g/dL (ref 6.5–8.1)

## 2019-08-02 ENCOUNTER — Inpatient Hospital Stay: Payer: Medicare Other

## 2019-08-02 ENCOUNTER — Other Ambulatory Visit: Payer: Self-pay

## 2019-08-02 DIAGNOSIS — C450 Mesothelioma of pleura: Secondary | ICD-10-CM | POA: Diagnosis not present

## 2019-08-02 LAB — CMP (CANCER CENTER ONLY)
ALT: 28 U/L (ref 0–44)
AST: 17 U/L (ref 15–41)
Albumin: 3.5 g/dL (ref 3.5–5.0)
Alkaline Phosphatase: 66 U/L (ref 38–126)
Anion gap: 11 (ref 5–15)
BUN: 17 mg/dL (ref 8–23)
CO2: 25 mmol/L (ref 22–32)
Calcium: 9.1 mg/dL (ref 8.9–10.3)
Chloride: 105 mmol/L (ref 98–111)
Creatinine: 1.18 mg/dL (ref 0.61–1.24)
GFR, Est AFR Am: >60 mL/min (ref >60)
GFR, Estimated: 57 mL/min — ABNORMAL LOW (ref >60)
Glucose, Bld: 96 mg/dL (ref 70–99)
Potassium: 3.7 mmol/L (ref 3.5–5.1)
Sodium: 141 mmol/L (ref 135–145)
Total Bilirubin: 0.2 mg/dL — ABNORMAL LOW (ref 0.3–1.2)
Total Protein: 6.7 g/dL (ref 6.5–8.1)

## 2019-08-02 LAB — CBC WITH DIFFERENTIAL (CANCER CENTER ONLY)
Abs Immature Granulocytes: 0.25 10*3/uL — ABNORMAL HIGH (ref 0.00–0.07)
Basophils Absolute: 0 10*3/uL (ref 0.0–0.1)
Basophils Relative: 0 %
Eosinophils Absolute: 0.4 10*3/uL (ref 0.0–0.5)
Eosinophils Relative: 7 %
HCT: 37.3 % — ABNORMAL LOW (ref 39.0–52.0)
Hemoglobin: 12.1 g/dL — ABNORMAL LOW (ref 13.0–17.0)
Immature Granulocytes: 5 %
Lymphocytes Relative: 42 %
Lymphs Abs: 2 10*3/uL (ref 0.7–4.0)
MCH: 30.7 pg (ref 26.0–34.0)
MCHC: 32.4 g/dL (ref 30.0–36.0)
MCV: 94.7 fL (ref 80.0–100.0)
Monocytes Absolute: 0.7 10*3/uL (ref 0.1–1.0)
Monocytes Relative: 15 %
Neutro Abs: 1.5 10*3/uL — ABNORMAL LOW (ref 1.7–7.7)
Neutrophils Relative %: 31 %
Platelet Count: 112 10*3/uL — ABNORMAL LOW (ref 150–400)
RBC: 3.94 MIL/uL — ABNORMAL LOW (ref 4.22–5.81)
RDW: 16 % — ABNORMAL HIGH (ref 11.5–15.5)
WBC Count: 4.7 10*3/uL (ref 4.0–10.5)
nRBC: 0 % (ref 0.0–0.2)

## 2019-08-05 ENCOUNTER — Emergency Department (HOSPITAL_COMMUNITY): Payer: Medicare Other

## 2019-08-05 ENCOUNTER — Inpatient Hospital Stay (HOSPITAL_COMMUNITY)
Admission: EM | Admit: 2019-08-05 | Discharge: 2019-08-09 | DRG: 871 | Disposition: A | Discharge status: Home Health | Payer: Medicare Other | Attending: Internal Medicine | Admitting: Internal Medicine

## 2019-08-05 ENCOUNTER — Other Ambulatory Visit: Payer: Self-pay

## 2019-08-05 ENCOUNTER — Telehealth: Payer: Self-pay | Admitting: *Deleted

## 2019-08-05 ENCOUNTER — Encounter (HOSPITAL_COMMUNITY): Payer: Self-pay | Admitting: Emergency Medicine

## 2019-08-05 DIAGNOSIS — I2699 Other pulmonary embolism without acute cor pulmonale: Secondary | ICD-10-CM | POA: Diagnosis present

## 2019-08-05 DIAGNOSIS — I255 Ischemic cardiomyopathy: Secondary | ICD-10-CM | POA: Diagnosis present

## 2019-08-05 DIAGNOSIS — I2510 Atherosclerotic heart disease of native coronary artery without angina pectoris: Secondary | ICD-10-CM | POA: Diagnosis present

## 2019-08-05 DIAGNOSIS — E039 Hypothyroidism, unspecified: Secondary | ICD-10-CM | POA: Diagnosis present

## 2019-08-05 DIAGNOSIS — I2583 Coronary atherosclerosis due to lipid rich plaque: Secondary | ICD-10-CM | POA: Diagnosis present

## 2019-08-05 DIAGNOSIS — Z86711 Personal history of pulmonary embolism: Secondary | ICD-10-CM | POA: Insufficient documentation

## 2019-08-05 DIAGNOSIS — F329 Major depressive disorder, single episode, unspecified: Secondary | ICD-10-CM | POA: Diagnosis present

## 2019-08-05 DIAGNOSIS — E876 Hypokalemia: Secondary | ICD-10-CM | POA: Diagnosis present

## 2019-08-05 DIAGNOSIS — I10 Essential (primary) hypertension: Secondary | ICD-10-CM | POA: Diagnosis present

## 2019-08-05 DIAGNOSIS — N390 Urinary tract infection, site not specified: Secondary | ICD-10-CM | POA: Diagnosis present

## 2019-08-05 DIAGNOSIS — K219 Gastro-esophageal reflux disease without esophagitis: Secondary | ICD-10-CM | POA: Diagnosis present

## 2019-08-05 DIAGNOSIS — T451X5A Adverse effect of antineoplastic and immunosuppressive drugs, initial encounter: Secondary | ICD-10-CM | POA: Diagnosis present

## 2019-08-05 DIAGNOSIS — E785 Hyperlipidemia, unspecified: Secondary | ICD-10-CM | POA: Diagnosis present

## 2019-08-05 DIAGNOSIS — C450 Mesothelioma of pleura: Secondary | ICD-10-CM | POA: Diagnosis present

## 2019-08-05 DIAGNOSIS — Z66 Do not resuscitate: Secondary | ICD-10-CM | POA: Diagnosis present

## 2019-08-05 DIAGNOSIS — Z951 Presence of aortocoronary bypass graft: Secondary | ICD-10-CM

## 2019-08-05 DIAGNOSIS — A4152 Sepsis due to Pseudomonas: Secondary | ICD-10-CM | POA: Diagnosis not present

## 2019-08-05 DIAGNOSIS — Z79899 Other long term (current) drug therapy: Secondary | ICD-10-CM

## 2019-08-05 DIAGNOSIS — Z7952 Long term (current) use of systemic steroids: Secondary | ICD-10-CM

## 2019-08-05 DIAGNOSIS — Z79890 Hormone replacement therapy (postmenopausal): Secondary | ICD-10-CM

## 2019-08-05 DIAGNOSIS — E7800 Pure hypercholesterolemia, unspecified: Secondary | ICD-10-CM | POA: Diagnosis present

## 2019-08-05 DIAGNOSIS — Z7982 Long term (current) use of aspirin: Secondary | ICD-10-CM

## 2019-08-05 DIAGNOSIS — M069 Rheumatoid arthritis, unspecified: Secondary | ICD-10-CM | POA: Diagnosis present

## 2019-08-05 DIAGNOSIS — I25810 Atherosclerosis of coronary artery bypass graft(s) without angina pectoris: Secondary | ICD-10-CM | POA: Diagnosis not present

## 2019-08-05 DIAGNOSIS — Z96643 Presence of artificial hip joint, bilateral: Secondary | ICD-10-CM | POA: Diagnosis present

## 2019-08-05 DIAGNOSIS — Z20828 Contact with and (suspected) exposure to other viral communicable diseases: Secondary | ICD-10-CM | POA: Diagnosis present

## 2019-08-05 DIAGNOSIS — Z87891 Personal history of nicotine dependence: Secondary | ICD-10-CM

## 2019-08-05 DIAGNOSIS — Z7951 Long term (current) use of inhaled steroids: Secondary | ICD-10-CM

## 2019-08-05 DIAGNOSIS — N179 Acute kidney failure, unspecified: Secondary | ICD-10-CM | POA: Diagnosis present

## 2019-08-05 DIAGNOSIS — J9601 Acute respiratory failure with hypoxia: Secondary | ICD-10-CM | POA: Diagnosis present

## 2019-08-05 DIAGNOSIS — J90 Pleural effusion, not elsewhere classified: Secondary | ICD-10-CM | POA: Diagnosis present

## 2019-08-05 DIAGNOSIS — G20 Parkinson's disease: Secondary | ICD-10-CM | POA: Diagnosis present

## 2019-08-05 DIAGNOSIS — I252 Old myocardial infarction: Secondary | ICD-10-CM

## 2019-08-05 DIAGNOSIS — D6481 Anemia due to antineoplastic chemotherapy: Secondary | ICD-10-CM | POA: Diagnosis present

## 2019-08-05 LAB — URINALYSIS, ROUTINE W REFLEX MICROSCOPIC
Bilirubin Urine: NEGATIVE
Glucose, UA: NEGATIVE mg/dL
Hgb urine dipstick: NEGATIVE
Ketones, ur: NEGATIVE mg/dL
Nitrite: POSITIVE — AB
Protein, ur: 30 mg/dL — AB
Specific Gravity, Urine: 1.02 (ref 1.005–1.030)
WBC, UA: >50 WBC/hpf — ABNORMAL HIGH (ref 0–5)
pH: 6 (ref 5.0–8.0)

## 2019-08-05 LAB — BASIC METABOLIC PANEL
Anion gap: 12 (ref 5–15)
BUN: 20 mg/dL (ref 8–23)
CO2: 24 mmol/L (ref 22–32)
Calcium: 9 mg/dL (ref 8.9–10.3)
Chloride: 102 mmol/L (ref 98–111)
Creatinine, Ser: 1.29 mg/dL — ABNORMAL HIGH (ref 0.61–1.24)
GFR calc Af Amer: 59 mL/min — ABNORMAL LOW (ref >60)
GFR calc non Af Amer: 51 mL/min — ABNORMAL LOW (ref >60)
Glucose, Bld: 127 mg/dL — ABNORMAL HIGH (ref 70–99)
Potassium: 3.8 mmol/L (ref 3.5–5.1)
Sodium: 138 mmol/L (ref 135–145)

## 2019-08-05 LAB — SARS CORONAVIRUS 2 BY RT PCR (HOSPITAL ORDER, PERFORMED IN ~~LOC~~ HOSPITAL LAB): SARS Coronavirus 2: NEGATIVE

## 2019-08-05 LAB — CBC
HCT: 39.3 % (ref 39.0–52.0)
Hemoglobin: 12.7 g/dL — ABNORMAL LOW (ref 13.0–17.0)
MCH: 30.9 pg (ref 26.0–34.0)
MCHC: 32.3 g/dL (ref 30.0–36.0)
MCV: 95.6 fL (ref 80.0–100.0)
Platelets: 181 10*3/uL (ref 150–400)
RBC: 4.11 MIL/uL — ABNORMAL LOW (ref 4.22–5.81)
RDW: 17.1 % — ABNORMAL HIGH (ref 11.5–15.5)
WBC: 7.8 10*3/uL (ref 4.0–10.5)
nRBC: 0.9 % — ABNORMAL HIGH (ref 0.0–0.2)

## 2019-08-05 LAB — LACTIC ACID, PLASMA: Lactic Acid, Venous: 1.7 mmol/L (ref 0.5–1.9)

## 2019-08-05 LAB — TROPONIN I (HIGH SENSITIVITY): Troponin I (High Sensitivity): 27 ng/L — ABNORMAL HIGH (ref <18)

## 2019-08-05 MED ORDER — ACETAMINOPHEN 650 MG RE SUPP: 650.0000 mg | Freq: Four times a day (QID) | RECTAL | Status: DC | PRN

## 2019-08-05 MED ORDER — POLYETHYLENE GLYCOL 3350 17 G PO PACK: 17.0000 g | PACK | Freq: Every day | ORAL | Status: DC | PRN

## 2019-08-05 MED ORDER — CARBIDOPA-LEVODOPA 25-100 MG PO TABS
1.0000 | ORAL_TABLET | Freq: Three times a day (TID) | ORAL | Status: DC
  Administered 2019-08-06 – 2019-08-09 (×11): 1 via ORAL
  Filled 2019-08-05 (×13): qty 1

## 2019-08-05 MED ORDER — LEVOTHYROXINE SODIUM 100 MCG PO TABS
200.0000 ug | ORAL_TABLET | Freq: Every day | ORAL | Status: DC
  Administered 2019-08-06 – 2019-08-09 (×4): 200 ug via ORAL
  Filled 2019-08-05 (×3): qty 2
  Filled 2019-08-05: qty 1

## 2019-08-05 MED ORDER — HYDROCODONE-ACETAMINOPHEN 5-325 MG PO TABS
1.0000 | ORAL_TABLET | ORAL | Status: DC | PRN
  Administered 2019-08-06: 2 via ORAL
  Filled 2019-08-05: qty 2

## 2019-08-05 MED ORDER — IOHEXOL 350 MG/ML SOLN
100.0000 mL | Freq: Once | INTRAVENOUS | Status: AC | PRN
  Administered 2019-08-05: 100 mL via INTRAVENOUS

## 2019-08-05 MED ORDER — SODIUM CHLORIDE 0.9 % IV SOLN
INTRAVENOUS | Status: AC
  Administered 2019-08-05: via INTRAVENOUS

## 2019-08-05 MED ORDER — PREDNISONE 10 MG PO TABS
10.0000 mg | ORAL_TABLET | Freq: Every day | ORAL | Status: DC
  Administered 2019-08-06 – 2019-08-09 (×4): 10 mg via ORAL
  Filled 2019-08-05 (×4): qty 1

## 2019-08-05 MED ORDER — ALPRAZOLAM 0.25 MG PO TABS: 0.2500 mg | ORAL_TABLET | Freq: Two times a day (BID) | ORAL | Status: DC | PRN

## 2019-08-05 MED ORDER — ONDANSETRON HCL 4 MG/2ML IJ SOLN: 4.0000 mg | Freq: Four times a day (QID) | INTRAMUSCULAR | Status: DC | PRN

## 2019-08-05 MED ORDER — ACETAMINOPHEN 325 MG PO TABS: 650.0000 mg | ORAL_TABLET | Freq: Four times a day (QID) | ORAL | Status: DC | PRN

## 2019-08-05 MED ORDER — SENNA 8.6 MG PO TABS
1.0000 | ORAL_TABLET | Freq: Two times a day (BID) | ORAL | Status: DC
  Administered 2019-08-05 – 2019-08-09 (×8): 8.6 mg via ORAL
  Filled 2019-08-05 (×8): qty 1

## 2019-08-05 MED ORDER — SODIUM CHLORIDE 0.9 % IV BOLUS
500.0000 mL | Freq: Once | INTRAVENOUS | Status: AC
  Administered 2019-08-05: 500 mL via INTRAVENOUS

## 2019-08-05 MED ORDER — ROPINIROLE HCL 1 MG PO TABS
0.5000 mg | ORAL_TABLET | Freq: Every day | ORAL | Status: DC
  Administered 2019-08-06 – 2019-08-08 (×4): 0.5 mg via ORAL
  Filled 2019-08-05 (×5): qty 1

## 2019-08-05 MED ORDER — FOLIC ACID 1 MG PO TABS
1.0000 mg | ORAL_TABLET | Freq: Every day | ORAL | Status: DC
  Administered 2019-08-06 – 2019-08-09 (×4): 1 mg via ORAL
  Filled 2019-08-05 (×4): qty 1

## 2019-08-05 MED ORDER — MIRTAZAPINE 15 MG PO TABS
15.0000 mg | ORAL_TABLET | Freq: Every day | ORAL | Status: DC
  Administered 2019-08-05 – 2019-08-08 (×4): 15 mg via ORAL
  Filled 2019-08-05: qty 2
  Filled 2019-08-05 (×3): qty 1

## 2019-08-05 MED ORDER — SODIUM CHLORIDE 0.9 % IV SOLN
1.0000 g | Freq: Once | INTRAVENOUS | Status: AC
  Administered 2019-08-05: 1 g via INTRAVENOUS
  Filled 2019-08-05: qty 10

## 2019-08-05 MED ORDER — IPRATROPIUM-ALBUTEROL 0.5-2.5 (3) MG/3ML IN SOLN: 3.0000 mL | Freq: Four times a day (QID) | RESPIRATORY_TRACT | Status: DC | PRN

## 2019-08-05 MED ORDER — HEPARIN BOLUS VIA INFUSION
3000.0000 [IU] | Freq: Once | INTRAVENOUS | Status: AC
  Administered 2019-08-05: 3000 [IU] via INTRAVENOUS
  Filled 2019-08-05: qty 3000

## 2019-08-05 MED ORDER — TICAGRELOR 90 MG PO TABS
90.0000 mg | ORAL_TABLET | Freq: Two times a day (BID) | ORAL | Status: DC
  Administered 2019-08-06 – 2019-08-09 (×8): 90 mg via ORAL
  Filled 2019-08-05 (×10): qty 1

## 2019-08-05 MED ORDER — ONDANSETRON HCL 4 MG PO TABS: 4.0000 mg | ORAL_TABLET | Freq: Four times a day (QID) | ORAL | Status: DC | PRN

## 2019-08-05 MED ORDER — HEPARIN (PORCINE) 25000 UT/250ML-% IV SOLN
1450.0000 [IU]/h | INTRAVENOUS | Status: DC
  Administered 2019-08-05: 1450 [IU]/h via INTRAVENOUS
  Filled 2019-08-05: qty 250

## 2019-08-05 NOTE — ED Triage Notes (Signed)
Pt c/o feeling SOB and fatigue since last night.

## 2019-08-05 NOTE — ED Notes (Signed)
Patient has extra blood in the main lab one gold and one blue

## 2019-08-05 NOTE — ED Provider Notes (Addendum)
[facility] DEPT Provider Note   CSN: [id number] Arrival date & time: 08/05/19  1233    History   Chief Complaint Chief Complaint  Patient presents with   Shortness of Breath   Fatigue    HPI Jeffrey [name] is a 85 y.o. male.     HPI Patient with history of malignant mesothelioma presents with 1 day of fever to 102.6, increased shortness of breath and nonproductive cough.  He also has had some mild dysuria.  Denies abdominal pain, nausea, vomiting or diarrhea.  No known sick contacts.  No new rashes.  No known Covid exposures. Past Medical History:  Diagnosis Date   Anxiety    Aortic stenosis    mild AS by 07/20/13 echo ([facility])   Arthritis    "all over"   Coronary artery disease    NSTEMI 06/2010, CABG x3=> [place]->LAD, SVG->OM1, SVG->PDA, DES LCx 10/2011, DES LAD 12/2011   Depression    GERD (gastroesophageal reflux disease)    H/O hiatal hernia    Hearing aid worn    B/L   High cholesterol    History of blood transfusion reaction 03/18/13   "he almost died; he has to get irradiated blood next time"   Hypertension    Hypothyroidism    Ischemic cardiomyopathy    ITP (idiopathic thrombocytopenic purpura)    Dr. Gaynelle Arabian, on Promacta   Myocardial infarction [facility]) 03-18-2010   Pneumonia 1940's X 1; 2014/03/18 X 1   Rheumatoid arthritis ([place])    Shortness of breath    with exertion, has not been very active   Sleep apnea    Wears glasses    Wears partial dentures     Patient Active Problem List   Diagnosis Date Noted   Pulmonary embolus ([place]) 08/05/2019   Acute lower UTI 08/05/2019   Pulmonary embolism ([place]) 08/05/2019   Malignant mesothelioma of pleura ([place]) 06/16/2019   Goals of care, counseling/discussion 06/16/2019   Encounter for antineoplastic chemotherapy 06/16/2019   NSTEMI (non-ST elevated myocardial infarction) ([place]) 06/01/2019   Shortness of breath    Coronary artery disease due to  lipid rich plaque    Elevated troponin    Coronary artery disease involving native coronary artery of native heart with unstable angina pectoris (HCC)    Pleural effusion on right 05/14/2019   Pancreatic lesion 05/14/2019   PD (Parkinson's disease) ([place]) 12/02/2018   Primary osteoarthritis of right hip 06/29/2018   Primary osteoarthritis of left knee 11/24/2014   Primary osteoarthritis of knee 11/24/2014   Bacterial infection of knee joint ([place]) 08/17/2014   Septic joint of left knee joint ([place]) 06/22/2014   CAP (community acquired pneumonia) 01/26/2014   CAD (coronary artery disease) of artery bypass graft 01/26/2014   Hypothyroidism 01/26/2014   Thrombocytopenia, unspecified ([place]) 01/26/2014   Nausea alone 01/26/2014   Effusion of knee joint, left 10/11/2012   OA (osteoarthritis) of knee 10/11/2012   HIP PAIN 11/15/2007   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 11/15/2007    Past Surgical History:  Procedure Laterality Date   APPENDECTOMY     BACK SURGERY     CATARACT EXTRACTION, BILATERAL     CHEST TUBE INSERTION Right 05/20/2019   Procedure: INSERTION PLEURAL DRAINAGE CATHETER;  Surgeon: Ivin Poot, MD;  Location: [facility];  Service: Thoracic;  Laterality: Right;   COLONOSCOPY     CORONARY ANGIOPLASTY WITH STENT PLACEMENT     DES Lcx 10/2011, DES LAD 12/2011  CORONARY ARTERY BYPASS GRAFT  2011   "CABG X3"   CORONARY STENT INTERVENTION N/A 05/26/2019   Procedure: CORONARY STENT INTERVENTION;  Surgeon: Nelva Bush, MD;  Location: [facility];  Service: Cardiovascular;  Laterality: N/A;   GASTROC RECESSION EXTREMITY Right 07/06/2015   Pasty Spillers (orthopedics- [place], [place])   HERNIA REPAIR     HIATAL HERNIA REPAIR     IR IMAGING GUIDED PORT INSERTION  06/29/2019   KNEE ARTHROSCOPY Left 06/22/2014   w/I&D   KNEE ARTHROSCOPY Left 06/22/2014   Procedure: IRRIGATION AND DEBRIDEMENT WITH CHRONDROPLASTY;  Surgeon: Hessie Dibble,  MD;  Location: [facility];  Service: Orthopedics;  Laterality: Left;   LEFT HEART CATH AND CORS/GRAFTS ANGIOGRAPHY N/A 05/26/2019   Procedure: LEFT HEART CATH AND CORS/GRAFTS ANGIOGRAPHY;  Surgeon: Nelva Bush, MD;  Location: [facility];  Service: Cardiovascular;  Laterality: N/A;   LUMBAR DISC SURGERY  1960's?   MULTIPLE TOOTH EXTRACTIONS     PLEURAL BIOPSY Right 05/20/2019   Procedure: PLEURAL BIOPSY;  Surgeon: Ivin Poot, MD;  Location: [facility];  Service: Thoracic;  Laterality: Right;   SHOULDER ARTHROSCOPY Right 04/09/2017   Procedure: ARTHROSCOPY SHOULDER;  Surgeon: Melrose Nakayama, MD;  Location: [facility];  Service: Orthopedics;  Laterality: Right;   STERNAL CLOSURE     "wires from OHS taken out; plate put in" (0/02/91)   SYNOVECTOMY Left 08/17/2014   Procedure: SYNOVECTOMY LEFT KNEE;  Surgeon: Hessie Dibble, MD;  Location: [place] [facility];  Service: Orthopedics;  Laterality: Left;   TOTAL ANKLE ARTHROPLASTY Right 07/06/2015   Pasty Spillers (orthopedics- [place])   TOTAL HIP ARTHROPLASTY Right 06/29/2018   Procedure: RIGHT TOTAL HIP ARTHROPLASTY ANTERIOR APPROACH;  Surgeon: Melrose Nakayama, MD;  Location: [facility];  Service: Orthopedics;  Laterality: Right;   TOTAL HIP ARTHROPLASTY Left 06/09/2016   Nicki Reaper Streater Claiborne Billings (orthopedics- [facility])   TOTAL KNEE ARTHROPLASTY Left 11/24/2014   Procedure: TOTAL KNEE ARTHROPLASTY;  Surgeon: Hessie Dibble, MD;  Location: [facility];  Service: Orthopedics;  Laterality: Left;   VIDEO ASSISTED THORACOSCOPY Right 05/20/2019   Procedure: VIDEO ASSISTED THORACOSCOPY;  Surgeon: Ivin Poot, MD;  Location: [facility];  Service: Thoracic;  Laterality: Right;        Home Medications    Prior to Admission medications   Medication Sig Start Date End Date Taking? Authorizing Provider  albuterol (VENTOLIN HFA) 108 (90 Base) MCG/ACT inhaler Inhale 1 puff into the lungs 2 (two) times daily as needed for shortness of breath. 07/23/19  Yes [provider]  ALPRAZolam (XANAX) 0.25 MG tablet Take 1 tablet (0.25 mg total) by mouth 2 (two) times daily as needed for anxiety. 07/02/18  Yes Loni Dolly, PA-C  aspirin EC 81 MG tablet Take 1 tablet (81 mg total) by mouth daily. 05/28/19 08/26/19 Yes Elodia Florence., MD  carbidopa-levodopa (SINEMET IR) 25-100 MG tablet Take 1 tablet by mouth 3 (three) times daily. 12/02/18  Yes Tat, Eustace Quail, DO  chlorproMAZINE (THORAZINE) 25 MG tablet Take 1 tablet (25 mg total) by mouth 3 (three) times daily. 07/04/19  Yes Curt Bears, MD  dexamethasone (DECADRON) 4 MG tablet 4 mg p.o. twice daily the day before, day of and day after chemotherapy every 3 weeks 06/16/19  Yes Curt Bears, MD  Fluticasone-Salmeterol (ADVAIR) 100-50 MCG/DOSE AEPB Inhale 1 puff into the lungs daily.   Yes [provider]  folic acid (FOLVITE) 1 MG tablet Take 1 tablet (1 mg total) by  mouth daily. 06/16/19  Yes Curt Bears, MD  ipratropium-albuterol (DUONEB) 0.5-2.5 (3) MG/3ML SOLN Take 3 mLs by nebulization every 6 (six) hours as needed. J44.9 Patient taking differently: Take 3 mLs by nebulization every 6 (six) hours as needed (shortness of breath). J44.9 07/11/19  Yes Martyn Ehrich, NP  levothyroxine (SYNTHROID, LEVOTHROID) 200 MCG tablet Take 200 mcg by mouth at bedtime.    Yes [provider]  losartan (COZAAR) 50 MG tablet Take 50 mg by mouth daily. 06/11/19  Yes [provider]  methotrexate (RHEUMATREX) 2.5 MG tablet Take 15 mg by mouth once a week. Caution:Chemotherapy. Protect from light.   Yes [provider]  mirtazapine (REMERON) 15 MG tablet Take 15 mg by mouth at bedtime.   Yes [provider]  Omega-3 Fatty Acids (FISH OIL) 1200 MG CAPS Take 1,200 mg by mouth at bedtime. 360 MG OMEGA-3   Yes [provider]  omeprazole (PRILOSEC) 40 MG capsule Take 40 mg by mouth at bedtime.    Yes [provider]  predniSONE (DELTASONE) 10 MG tablet Take  10 mg by mouth daily with breakfast.   Yes [provider]  prochlorperazine (COMPAZINE) 10 MG tablet Take 1 tablet (10 mg total) by mouth every 6 (six) hours as needed for nausea or vomiting. 06/16/19  Yes Curt Bears, MD  rOPINIRole (REQUIP) 0.5 MG tablet Take 0.5 mg by mouth at bedtime.   Yes [provider]  ticagrelor (BRILINTA) 90 MG TABS tablet Take 1 tablet (90 mg total) by mouth 2 (two) times daily. 05/28/19 08/26/19 Yes Elodia Florence., MD  Vitamin D, Ergocalciferol, (DRISDOL) 1.25 MG (50000 UT) CAPS capsule Take 50,000 Units by mouth every 7 (seven) days.   Yes [provider]  lidocaine-prilocaine (EMLA) cream Apply 1 application topically as needed. 06/21/19   Curt Bears, MD  Spacer/Aero-Holding Chambers (AEROCHAMBER MV) inhaler Use as instructed 06/01/19   Martyn Ehrich, NP    Family History Family History  Problem Relation Age of Onset   Heart disease Other    Arthritis Other    Heart disease Mother    Alzheimer's disease Father    Rheum arthritis Sister    Rheum arthritis Brother    Healthy Son     Social History Social History   Tobacco Use   Smoking status: Former Smoker    Packs/day: 1.00    Years: 15.00    Pack years: 15.00    Types: Cigarettes    Quit date: 12/22/1966    Years since quitting: 52.6   Smokeless tobacco: Former Systems developer    Types: Chew   Tobacco comment: "quit smoking ~ late ~ 60's; quit chewing in the 1970's"  Substance Use Topics   Alcohol use: No   Drug use: No     Allergies   Lipitor [atorvastatin], Methylprednisolone, Morphine and related, Other, Sulfa antibiotics, and Doxycycline   Review of Systems Review of Systems  Constitutional: Positive for chills, fatigue and fever.  HENT: Negative for sore throat and trouble swallowing.   Eyes: Negative for visual disturbance.  Respiratory: Positive for cough and shortness of breath.   Cardiovascular: Negative for chest pain and leg  swelling.  Gastrointestinal: Negative for abdominal pain, constipation, diarrhea, nausea and vomiting.  Genitourinary: Positive for dysuria. Negative for flank pain, frequency and hematuria.  Musculoskeletal: Negative for back pain, myalgias, neck pain and neck stiffness.  Skin: Negative for rash and wound.  Neurological: Positive for tremors. Negative for dizziness, weakness, light-headedness  and numbness.  All other systems reviewed and are negative.    Physical Exam Updated Vital Signs BP 126/69 (BP Location: Right Arm)    Pulse 79    Temp 98.5 F (36.9 C) (Oral)    Resp (!) 21    Ht (S) 6' (1.829 m)    Wt (S) 91.5 kg    SpO2 97%    BMI 27.36 kg/m   Physical Exam Vitals signs and nursing note reviewed.  Constitutional:      Appearance: Normal appearance. He is well-developed.  HENT:     Head: Normocephalic and atraumatic.     Nose: Nose normal.     Mouth/Throat:     Mouth: Mucous membranes are moist.  Eyes:     Extraocular Movements: Extraocular movements intact.     Pupils: Pupils are equal, round, and reactive to light.  Neck:     Musculoskeletal: Normal range of motion and neck supple. No neck rigidity or muscular tenderness.  Cardiovascular:     Rate and Rhythm: Normal rate and regular rhythm.     Heart sounds: No murmur. No friction rub. No gallop.   Pulmonary:     Effort: Pulmonary effort is normal.     Comments: Diminished breath sounds right base compared to left.  No respiratory distress. Abdominal:     General: Bowel sounds are normal. There is no distension.     Palpations: Abdomen is soft.     Tenderness: There is no abdominal tenderness. There is no right CVA tenderness, guarding or rebound.  Musculoskeletal: Normal range of motion.        General: No swelling, tenderness, deformity or signs of injury.     Right lower leg: No edema.     Left lower leg: No edema.  Lymphadenopathy:     Cervical: No cervical adenopathy.  Skin:    General: Skin is warm and  dry.     Capillary Refill: Capillary refill takes less than 2 seconds.     Findings: No erythema or rash.  Neurological:     General: No focal deficit present.     Mental Status: He is alert and oriented to person, place, and time.     Comments: Tremor noted  Psychiatric:        Mood and Affect: Mood normal.        Behavior: Behavior normal.      ED Treatments / Results  Labs (all labs ordered are listed, but only abnormal results are displayed) Labs Reviewed  BASIC METABOLIC PANEL - Abnormal; Notable for the following components:      Result Value   Glucose, Bld 127 (*)    Creatinine, Ser 1.29 (*)    GFR calc non Af Amer 51 (*)    GFR calc Af Amer 59 (*)    All other components within normal limits  CBC - Abnormal; Notable for the following components:   RBC 4.11 (*)    Hemoglobin 12.7 (*)    RDW 17.1 (*)    nRBC 0.9 (*)    All other components within normal limits  URINALYSIS, ROUTINE W REFLEX MICROSCOPIC - Abnormal; Notable for the following components:   Protein, ur 30 (*)    Nitrite POSITIVE (*)    Leukocytes,Ua MODERATE (*)    WBC, UA >50 (*)    Bacteria, UA RARE (*)    All other components within normal limits  CBC - Abnormal; Notable for the following components:   WBC 12.0 (*)  RBC 3.79 (*)    Hemoglobin 11.6 (*)    HCT 36.2 (*)    RDW 17.2 (*)    nRBC 0.4 (*)    All other components within normal limits  COMPREHENSIVE METABOLIC PANEL - Abnormal; Notable for the following components:   Glucose, Bld 107 (*)    Creatinine, Ser 1.27 (*)    Calcium 8.4 (*)    Albumin 3.3 (*)    GFR calc non Af Amer 52 (*)    All other components within normal limits  HEPARIN LEVEL (UNFRACTIONATED) - Abnormal; Notable for the following components:   Heparin Unfractionated 0.85 (*)    All other components within normal limits  TROPONIN I (HIGH SENSITIVITY) - Abnormal; Notable for the following components:   Troponin I (High Sensitivity) 27 (*)    All other components  within normal limits  TROPONIN I (HIGH SENSITIVITY) - Abnormal; Notable for the following components:   Troponin I (High Sensitivity) 31 (*)    All other components within normal limits  CULTURE, BLOOD (ROUTINE X 2)  CULTURE, BLOOD (ROUTINE X 2)  SARS CORONAVIRUS 2 (HOSPITAL ORDER, [facility])  URINE CULTURE  LACTIC ACID, PLASMA  MAGNESIUM  PHOSPHORUS  TSH  HEPARIN LEVEL (UNFRACTIONATED)    EKG EKG Interpretation  Date/Time:  Friday August 05 2019 21:58:52 EDT Ventricular Rate:  97 PR Interval:    QRS Duration: 111 QT Interval:  356 QTC Calculation: 453 R Axis:   -12 Text Interpretation:  Sinus rhythm Borderline repolarization abnormality Baseline wander in lead(s) V4 Confirmed by Julianne Rice [phone]) on 08/06/2019 4:55:44 PM   Radiology Dg Chest 2 View  Result Date: 08/05/2019 CLINICAL DATA:  Shortness of breath and fatigue. EXAM: CHEST - 2 VIEW COMPARISON:  July 13, 2019 FINDINGS: The right Port-A-Cath is stable. A right-sided pleural effusion and underlying opacity is stable. No other interval changes. The cardiomediastinal silhouette is stable. IMPRESSION: The right-sided pleural effusion with underlying opacity is stable. No acute interval changes. Electronically Signed   By: Dorise Bullion III M.D   On: 08/05/2019 14:07   Ct Angio Chest Pe W And/or Wo Contrast  Result Date: 08/05/2019 CLINICAL DATA:  Shortness of breath and fatigue since last night. PE suspected, high pretest probability. EXAM: CT ANGIOGRAPHY CHEST WITH CONTRAST TECHNIQUE: Multidetector CT imaging of the chest was performed using the standard protocol during bolus administration of intravenous contrast. Multiplanar CT image reconstructions and MIPs were obtained to evaluate the vascular anatomy. CONTRAST:  132mL OMNIPAQUE IOHEXOL 350 MG/ML SOLN COMPARISON:  Chest CT angiogram dated 05/23/2019. FINDINGS: Cardiovascular: Majority of the segmental and subsegmental pulmonary  arteries cannot be definitively characterized due to patient breathing motion artifact, however, there is at least a single pulmonary embolism within a peripheral subsegmental branch to the posterior segment of the RIGHT lower lobe. Additional peripheral pulmonary emboli suspected. No central obstructing pulmonary embolism identified within the main or intralobar pulmonary arteries bilaterally. No thoracic aortic aneurysm or evidence of aortic dissection. Diffuse aortic atherosclerosis. Cardiomegaly. No pericardial effusion. Diffuse coronary artery calcifications. Status post median sternotomy for CABG. Mediastinum/Nodes: No mass or enlarged lymph nodes are seen within the mediastinum or perihilar regions. Esophagus is unremarkable.z trachea appears normal. Lungs/Pleura: Chronic atelectasis and pleural thickening at the RIGHT lung base. No evidence of pneumonia or pulmonary edema. No pleural effusion or pneumothorax seen. Upper Abdomen: No acute findings. Musculoskeletal: No acute or suspicious osseous findings. Degenerative spondylosis of the thoracic spine, mild to moderate in degree.  Review of the MIP images confirms the above findings. IMPRESSION: 1. At least a single pulmonary embolism within a peripheral subsegmental branch to the posterior segment of the RIGHT lower lobe. Additional peripheral pulmonary emboli suspected, however, majority of the segmental and subsegmental pulmonary arteries cannot be definitively characterized due to patient breathing motion artifact. No central obstructing pulmonary embolism is identified within the main or intralobar pulmonary arteries bilaterally. 2. Cardiomegaly. No pericardial effusion. 3. Chronic atelectasis/pleural thickening at the RIGHT lung base. No evidence of pneumonia or pulmonary edema. No pleural effusion. Aortic Atherosclerosis (ICD10-I70.0). Electronically Signed   By: Franki Cabot M.D.   On: 08/05/2019 19:37   Vas [place] Lower Extremity Venous (dvt)  Result  Date: 08/06/2019  Lower Venous Study Indications: Pulmonary embolism.  Comparison Study: 05/19/19 negative Performing Technologist: June Leap RDMS, RVT  Examination Guidelines: A complete evaluation includes B-mode imaging, spectral Doppler, color Doppler, and power Doppler as needed of all accessible portions of each vessel. Bilateral testing is considered an integral part of a complete examination. Limited examinations for reoccurring indications may be performed as noted.  +---------+---------------+---------+-----------+----------+-------+  RIGHT     Compressibility Phasicity Spontaneity Properties Summary  +---------+---------------+---------+-----------+----------+-------+  CFV       Full            Yes       Yes                             +---------+---------------+---------+-----------+----------+-------+  SFJ       Full                                                      +---------+---------------+---------+-----------+----------+-------+  FV Prox   Full                                                      +---------+---------------+---------+-----------+----------+-------+  FV Mid    Full                                                      +---------+---------------+---------+-----------+----------+-------+  FV Distal Full                                                      +---------+---------------+---------+-----------+----------+-------+  PFV       Full                                                      +---------+---------------+---------+-----------+----------+-------+  POP       Full            Yes       Yes                             +---------+---------------+---------+-----------+----------+-------+  PTV       Full                                                      +---------+---------------+---------+-----------+----------+-------+  PERO      Full                                                      +---------+---------------+---------+-----------+----------+-------+    +---------+---------------+---------+-----------+----------+-------+  LEFT      Compressibility Phasicity Spontaneity Properties Summary  +---------+---------------+---------+-----------+----------+-------+  CFV       Full            Yes       Yes                             +---------+---------------+---------+-----------+----------+-------+  SFJ       Full                                                      +---------+---------------+---------+-----------+----------+-------+  FV Prox   Full                                                      +---------+---------------+---------+-----------+----------+-------+  FV Mid    Full                                                      +---------+---------------+---------+-----------+----------+-------+  FV Distal Full                                                      +---------+---------------+---------+-----------+----------+-------+  PFV       Full                                                      +---------+---------------+---------+-----------+----------+-------+  POP       Full            Yes       Yes                             +---------+---------------+---------+-----------+----------+-------+  PTV       Full                                                      +---------+---------------+---------+-----------+----------+-------+  PERO      Full                                                      +---------+---------------+---------+-----------+----------+-------+     Summary: Right: There is no evidence of deep vein thrombosis in the lower extremity. A cystic structure is found in the popliteal fossa. Left: There is no evidence of deep vein thrombosis in the lower extremity. No cystic structure found in the popliteal fossa. Incidental arterial finding: Left CFA atypical plaque formation anterior wall- possibly ulcerative plaque. Left Pop A significant  focal plaque causing >50% stenosis.  *See table(s) above for measurements and observations.     Preliminary     Procedures Procedures (including critical care time)  Medications Ordered in ED Medications  ALPRAZolam (XANAX) tablet 0.25 mg (has no administration in time range)  mirtazapine (REMERON) tablet 15 mg (15 mg Oral Given 08/05/19 2335)  levothyroxine (SYNTHROID) tablet 200 mcg (200 mcg Oral Given 08/06/19 0818)  predniSONE (DELTASONE) tablet 10 mg (10 mg Oral Given 06/19/35 6294)  folic acid (FOLVITE) tablet 1 mg (1 mg Oral Given 08/06/19 0929)  ticagrelor (BRILINTA) tablet 90 mg (90 mg Oral Given 08/06/19 0929)  carbidopa-levodopa (SINEMET IR) 25-100 MG per tablet immediate release 1 tablet (1 tablet Oral Given 08/06/19 1643)  rOPINIRole (REQUIP) tablet 0.5 mg (0.5 mg Oral Given 08/06/19 0022)  ipratropium-albuterol (DUONEB) 0.5-2.5 (3) MG/3ML nebulizer solution 3 mL (has no administration in time range)  acetaminophen (TYLENOL) tablet 650 mg (has no administration in time range)    Or  acetaminophen (TYLENOL) suppository 650 mg (has no administration in time range)  HYDROcodone-acetaminophen (NORCO/VICODIN) 5-325 MG per tablet 1-2 tablet (2 tablets Oral Given 08/06/19 0021)  ondansetron (ZOFRAN) tablet 4 mg (has no administration in time range)    Or  ondansetron (ZOFRAN) injection 4 mg (has no administration in time range)  0.9 %  sodium chloride infusion ( Intravenous Stopped 08/06/19 1048)  senna (SENOKOT) tablet 8.6 mg (8.6 mg Oral Given 08/06/19 0931)  polyethylene glycol (MIRALAX / GLYCOLAX) packet 17 g (has no administration in time range)  cefTRIAXone (ROCEPHIN) 1 g in sodium chloride 0.9 % 100 mL IVPB (has no administration in time range)  sodium chloride flush (NS) 0.9 % injection 10-40 mL (has no administration in time range)  heparin ADULT infusion 100 units/mL (25000 units/253mL sodium chloride 0.45%) (1,350 Units/hr Intravenous New Bag/Given 08/06/19 1309)  sodium chloride 0.9 % bolus 500 mL (0 mLs Intravenous Stopped 08/05/19 1823)  iohexol (OMNIPAQUE) 350 MG/ML  injection 100 mL (100 mLs Intravenous Contrast Given 08/05/19 1920)  cefTRIAXone (ROCEPHIN) 1 g in sodium chloride 0.9 % 100 mL IVPB (0 g Intravenous Stopped 08/05/19 2152)  heparin bolus via infusion 3,000 Units (3,000 Units Intravenous Bolus from Bag 08/05/19 2155)   CRITICAL CARE Performed by: Julianne Rice Total critical care time: 25 minutes Critical care time was exclusive of separately billable procedures and treating other patients. Critical care was necessary to treat or prevent imminent or life-threatening deterioration. Critical care was time spent personally by me on the following activities: development of treatment plan with patient and/or surrogate as well as nursing, discussions with consultants, evaluation of patient's response to treatment, examination of patient, obtaining history from patient or surrogate, ordering and performing treatments and interventions, ordering and review of laboratory studies,  ordering and review of radiographic studies, pulse oximetry and re-evaluation of patient's condition.  Initial Impression / Assessment and Plan / ED Course  I have reviewed the triage vital signs and the nursing notes.  Pertinent labs & imaging results that were available during my care of the patient were reviewed by me and considered in my medical decision making (see chart for details).       Patient with evidence of PE and UTI.  Likely cause of his symptoms.  COVID testing is negative.  Initiate antibiotics after urine culture.  Will start on blood thinner.  Hospitalist will admit.   Final Clinical Impressions(s) / ED Diagnoses   Final diagnoses:  Acute pulmonary embolism without acute cor pulmonale, unspecified pulmonary embolism type [facility])  Lower urinary tract infectious disease    ED Discharge Orders    None       Julianne Rice, MD 08/06/19 1655    Julianne Rice, MD 08/22/19 1710

## 2019-08-05 NOTE — ED Notes (Signed)
Unable to collect second red top culture bottle. Pt refused to allow nurses to stick pt anymore.

## 2019-08-05 NOTE — H&P (Signed)
EDRIC [name] ZMO:[id number] DOB: [birth year] DOA: 08/05/2019     PCP: [name], MD   Outpatient Specialists:      Oncology  Dr. Julien Nordmann    Patient arrived to ER on 08/05/19 at 1233  Patient coming from: home Lives  With family    Chief Complaint:   Chief Complaint  Patient presents with   Shortness of Breath   Fatigue    HPI: Jeffrey [name] is a 85 y.o. male with medical history significant of Malignant pleural mesothelioma, CAD, depression, GERD, HLD, HTN, Cardiomyopathy, ITP, RA, OSA    Presented with fever with temperature up to 102 shortness of breath cough dry but he has it recurrently secondary to history of mesothelioma. Has been having extra fatigue. Infectious risk factors:  Reports  fever, shortness of breath, dry cough    In  ER RAPID COVID TEST NEGATIVE    Regarding pertinent Chronic problems:   mesophelioma sp Chemo with carboplatin     Pleural Effusion PleurX placement 05/26/19   HTN on cozaar       CAD  - On Aspirin, statin,   Brilinta                 -  followed by cardiology                - last cardiac cath CABG x3=> [place]->LAD, SVG->OM1, SVG->PDA, DES LCx 10/2011, DES LAD 12/2011     Hypothyroidism:  Lab Results  Component Value Date   TSH 2.874 05/19/2019   on synthroid     OSA -on CPAP,        While in ER: Blood cultures was obtained Chest imaging chest x-ray as well as CTA Showed pulmonary embolism with peripheral subsegmental branch Cardiomegaly no evidence of pneumonia or pulmonary edema noted  The following Work up has been ordered so far:  Orders Placed This Encounter  Procedures   Culture, blood (Routine X 2) w Reflex to ID Panel   SARS Coronavirus 2 [name] order, Performed in [facility] lab) Nasopharyngeal Nasopharyngeal Swab   Urine culture   DG Chest 2 View   CT Angio Chest PE W and/or Wo Contrast   Basic metabolic panel   CBC   Urinalysis, Routine w reflex microscopic   Lactic acid,  plasma   Cardiac monitoring   Saline Lock IV, Maintain IV access   Cardiac monitoring   Consult to hospitalist  ALL PATIENTS BEING ADMITTED/HAVING PROCEDURES NEED COVID-19 SCREENING   heparin per pharmacy consult   Consult to care management   Airborne and Contact precautions   Pulse oximetry, continuous   ED EKG   Place in observation (patient's expected length of stay will be less than 2 midnights)      Following Medications were ordered in ER: Medications  heparin bolus via infusion 3,000 Units (has no administration in time range)    Followed by  heparin ADULT infusion 100 units/mL (25000 units/259mL sodium chloride 0.45%) (has no administration in time range)  sodium chloride 0.9 % bolus 500 mL (0 mLs Intravenous Stopped 08/05/19 1823)  iohexol (OMNIPAQUE) 350 MG/ML injection 100 mL (100 mLs Intravenous Contrast Given 08/05/19 1920)  cefTRIAXone (ROCEPHIN) 1 g in sodium chloride 0.9 % 100 mL IVPB (1 g Intravenous New Bag/Given 08/05/19 2044)        Consult Orders  (From admission, onward)         Start     Ordered   08/05/19 2050  Consult to care management  Once    Comments: DOAC  Provider:  (Not yet assigned)  Question:  Reason for consult:  Answer:  Medication needs   08/05/19 2050   08/05/19 1956  Consult to hospitalist  ALL PATIENTS BEING ADMITTED/HAVING PROCEDURES NEED COVID-19 SCREENING  Once    Comments: ALL PATIENTS BEING ADMITTED/HAVING PROCEDURES NEED COVID-19 SCREENING  Provider:  (Not yet assigned)  Question Answer Comment  Place call to: [facility]   Reason for Consult Admit      08/05/19 1955           Significant initial  Findings: Abnormal Labs Reviewed  BASIC METABOLIC PANEL - Abnormal; Notable for the following components:      Result Value   Glucose, Bld 127 (*)    Creatinine, Ser 1.29 (*)    GFR calc non Af Amer 51 (*)    GFR calc Af Amer 59 (*)    All other components within normal limits  CBC - Abnormal; Notable for  the following components:   RBC 4.11 (*)    Hemoglobin 12.7 (*)    RDW 17.1 (*)    nRBC 0.9 (*)    All other components within normal limits  URINALYSIS, ROUTINE W REFLEX MICROSCOPIC - Abnormal; Notable for the following components:   Protein, ur 30 (*)    Nitrite POSITIVE (*)    Leukocytes,Ua MODERATE (*)    WBC, UA >50 (*)    Bacteria, UA RARE (*)    All other components within normal limits     Otherwise labs showing:    Recent Labs  Lab 08/02/19 1317 08/05/19 1312  NA 141 138  K 3.7 3.8  CO2 25 24  GLUCOSE 96 127*  BUN 17 20  CREATININE 1.18 1.29*  CALCIUM 9.1 9.0    Cr     Up from baseline see below Lab Results  Component Value Date   CREATININE 1.29 (H) 08/05/2019   CREATININE 1.18 08/02/2019   CREATININE 1.30 (H) 07/26/2019    Recent Labs  Lab 08/02/19 1317  AST 17  ALT 28  ALKPHOS 66  BILITOT 0.2*  PROT 6.7  ALBUMIN 3.5   Lab Results  Component Value Date   CALCIUM 9.0 08/05/2019   PHOS 3.4 05/25/2019       WBC      Component Value Date/Time   WBC 7.8 08/05/2019 1312   ANC    Component Value Date/Time   NEUTROABS 1.5 (L) 08/02/2019 1317   ALC No results found for: LYMPHOABS    Plt: Lab Results  Component Value Date   PLT 181 08/05/2019     Lactic Acid, Venous    Component Value Date/Time   LATICACIDVEN 1.7 08/05/2019 1645      COVID-19 Labs     Lab Results  Component Value Date   SARSCOV2NAA NEGATIVE 08/05/2019   SARSCOV2NAA NOT DETECTED 05/25/2019   [place] NEGATIVE 05/14/2019       HG/HCT stable,       Component Value Date/Time   HGB 12.7 (L) 08/05/2019 1312   HGB 12.1 (L) 08/02/2019 1317   HCT 39.3 08/05/2019 1312      BNP (last 3 results) Recent Labs    05/14/19 1810  BNP 77.0       UA   evidence of UTI      Urine analysis:    Component Value Date/Time   COLORURINE YELLOW 08/05/2019 1830   APPEARANCEUR CLEAR 08/05/2019 1830   LABSPEC 1.020  08/05/2019 1830   PHURINE 6.0 08/05/2019 1830    GLUCOSEU NEGATIVE 08/05/2019 1830   HGBUR NEGATIVE 08/05/2019 [place] 08/05/2019 [place] 08/05/2019 1830   PROTEINUR 30 (A) 08/05/2019 1830   UROBILINOGEN 0.2 11/22/2014 1022   NITRITE POSITIVE (A) 08/05/2019 1830   LEUKOCYTESUR MODERATE (A) 08/05/2019 1830     CXR -  NON acute    CTA chest -   PE,  no evidence of infiltrate   ECG: ordered     ED Triage Vitals [08/05/19 1252]  Enc Vitals Group     BP 103/69     Pulse Rate 93     Resp 18     Temp 97.6 F (36.4 C)     Temp Source Oral     SpO2 96 %     Weight      Height      Head Circumference      Peak Flow      Pain Score      Pain Loc      Pain Edu?      Excl. in [place]?   ZOXW(96)@       Latest  Blood pressure 134/79, pulse 91, temperature 97.6 F (36.4 C), temperature source Oral, resp. rate (!) 27, SpO2 98 %.    Hospitalist was called for admission for PE and UTI   Review of Systems:    Pertinent positives include: Fevers, chills, fatigue, shortness of breath at rest.  dyspnea on exertion Constitutional:  No weight loss, night sweats,  weight loss  HEENT:  No headaches, Difficulty swallowing,Tooth/dental problems,Sore throat,  No sneezing, itching, ear ache, nasal congestion, post nasal drip,  Cardio-vascular:  No chest pain, Orthopnea, PND, anasarca, dizziness, palpitations.no Bilateral lower extremity swelling  GI:  No heartburn, indigestion, abdominal pain, nausea, vomiting, diarrhea, change in bowel habits, loss of appetite, melena, blood in stool, hematemesis Resp:  no  No, No excess mucus, no productive cough, No non-productive cough, No coughing up of blood.No change in color of mucus.No wheezing. Skin:  no rash or lesions. No jaundice GU:  no dysuria, change in color of urine, no urgency or frequency. No straining to urinate.  No flank pain.  Musculoskeletal:  No joint pain or no joint swelling. No decreased range of motion. No back pain.  Psych:  No  change in mood or affect. No depression or anxiety. No memory loss.  Neuro: no localizing neurological complaints, no tingling, no weakness, no double vision, no gait abnormality, no slurred speech, no confusion  All systems reviewed and apart from [place] all are negative  Past Medical History:   Past Medical History:  Diagnosis Date   Anxiety    Aortic stenosis    mild AS by 07/20/13 echo ([facility])   Arthritis    "all over"   Coronary artery disease    NSTEMI 06/2010, CABG x3=> [place]->LAD, SVG->OM1, SVG->PDA, DES LCx 10/2011, DES LAD 12/2011   Depression    GERD (gastroesophageal reflux disease)    H/O hiatal hernia    Hearing aid worn    B/L   High cholesterol    History of blood transfusion reaction March 25, 2013   "he almost died; he has to get irradiated blood next time"   Hypertension    Hypothyroidism    Ischemic cardiomyopathy    ITP (idiopathic thrombocytopenic purpura)    Dr. Gaynelle Arabian, on Promacta   Myocardial infarction [facility]) 2010-03-25   Pneumonia 1940's  X 1; 2015 X 1   Rheumatoid arthritis ([place])    Shortness of breath    with exertion, has not been very active   Sleep apnea    Wears glasses    Wears partial dentures       Past Surgical History:  Procedure Laterality Date   APPENDECTOMY     BACK SURGERY     CATARACT EXTRACTION, BILATERAL     CHEST TUBE INSERTION Right 05/20/2019   Procedure: INSERTION PLEURAL DRAINAGE CATHETER;  Surgeon: Ivin Poot, MD;  Location: [place];  Service: Thoracic;  Laterality: Right;   COLONOSCOPY     CORONARY ANGIOPLASTY WITH STENT PLACEMENT     DES Lcx 10/2011, DES LAD 12/2011   CORONARY ARTERY BYPASS GRAFT  2011   "CABG X3"   CORONARY STENT INTERVENTION N/A 05/26/2019   Procedure: CORONARY STENT INTERVENTION;  Surgeon: Nelva Bush, MD;  Location: [place] [facility];  Service: Cardiovascular;  Laterality: N/A;   GASTROC RECESSION EXTREMITY Right 07/06/2015   Pasty Spillers (orthopedics-  [place], [place])   HERNIA REPAIR     HIATAL HERNIA REPAIR     IR IMAGING GUIDED PORT INSERTION  06/29/2019   KNEE ARTHROSCOPY Left 06/22/2014   w/I&D   KNEE ARTHROSCOPY Left 06/22/2014   Procedure: IRRIGATION AND DEBRIDEMENT WITH CHRONDROPLASTY;  Surgeon: Hessie Dibble, MD;  Location: [place];  Service: Orthopedics;  Laterality: Left;   LEFT HEART CATH AND CORS/GRAFTS ANGIOGRAPHY N/A 05/26/2019   Procedure: LEFT HEART CATH AND CORS/GRAFTS ANGIOGRAPHY;  Surgeon: Nelva Bush, MD;  Location: [place] [facility];  Service: Cardiovascular;  Laterality: N/A;   LUMBAR DISC SURGERY  1960's?   MULTIPLE TOOTH EXTRACTIONS     PLEURAL BIOPSY Right 05/20/2019   Procedure: PLEURAL BIOPSY;  Surgeon: Ivin Poot, MD;  Location: [place];  Service: Thoracic;  Laterality: Right;   SHOULDER ARTHROSCOPY Right 04/09/2017   Procedure: ARTHROSCOPY SHOULDER;  Surgeon: Melrose Nakayama, MD;  Location: [place];  Service: Orthopedics;  Laterality: Right;   STERNAL CLOSURE     "wires from OHS taken out; plate put in" (02/25/6439)   SYNOVECTOMY Left 08/17/2014   Procedure: SYNOVECTOMY LEFT KNEE;  Surgeon: Hessie Dibble, MD;  Location: [place];  Service: Orthopedics;  Laterality: Left;   TOTAL ANKLE ARTHROPLASTY Right 07/06/2015   Pasty Spillers (orthopedics- [place])   TOTAL HIP ARTHROPLASTY Right 06/29/2018   Procedure: RIGHT TOTAL HIP ARTHROPLASTY ANTERIOR APPROACH;  Surgeon: Melrose Nakayama, MD;  Location: [facility];  Service: Orthopedics;  Laterality: Right;   TOTAL HIP ARTHROPLASTY Left 06/09/2016   Nicki Reaper Streater Claiborne Billings (orthopedics- [facility] [place])   TOTAL KNEE ARTHROPLASTY Left 11/24/2014   Procedure: TOTAL KNEE ARTHROPLASTY;  Surgeon: Hessie Dibble, MD;  Location: [place];  Service: Orthopedics;  Laterality: Left;   VIDEO ASSISTED THORACOSCOPY Right 05/20/2019   Procedure: VIDEO ASSISTED THORACOSCOPY;  Surgeon: Ivin Poot, MD;  Location: [place];  Service: Thoracic;  Laterality: Right;    Social  History:  Ambulatory  walker       reports that he quit smoking about 52 years ago. His smoking use included cigarettes. He has a 15.00 pack-year smoking history. He has quit using smokeless tobacco.  His smokeless tobacco use included chew. He reports that he does not drink alcohol or use drugs.     Family History:   Family History  Problem Relation Age of Onset   Heart disease Other    Arthritis Other    Heart disease Mother  Alzheimer's disease Father    Rheum arthritis Sister    Rheum arthritis Brother    Healthy Son     Allergies: Allergies  Allergen Reactions   Lipitor [Atorvastatin] Other (See Comments)    muscle spasms cramps   Methylprednisolone Other (See Comments)    cramps cramps   Morphine And Related     Hallucinations at times   Other     If patient is to receive blood - blood must be treated witgh radiation because his body will not accept a normal infusion    Sulfa Antibiotics Itching   Doxycycline Rash    Doxycycline caused itching redness on scalp and head Doxycycline caused itching redness on scalp and head     Prior to Admission medications   Medication Sig Start Date End Date Taking? Authorizing Provider  ALPRAZolam (XANAX) 0.25 MG tablet Take 1 tablet (0.25 mg total) by mouth 2 (two) times daily as needed for anxiety. 07/02/18   Loni Dolly, PA-C  aspirin EC 81 MG tablet Take 1 tablet (81 mg total) by mouth daily. 05/28/19 08/26/19  Elodia Florence., MD  carbidopa-levodopa (SINEMET IR) 25-100 MG tablet Take 1 tablet by mouth 3 (three) times daily. 12/02/18   Tat, Eustace Quail, DO  chlorproMAZINE (THORAZINE) 25 MG tablet Take 1 tablet (25 mg total) by mouth 3 (three) times daily. 07/04/19   Curt Bears, MD  dexamethasone (DECADRON) 4 MG tablet 4 mg p.o. twice daily the day before, day of and day after chemotherapy every 3 weeks 06/16/19   Curt Bears, MD  Fluticasone-Salmeterol (ADVAIR) 100-50 MCG/DOSE AEPB Inhale 1 puff into  the lungs daily.    [provider]  folic acid (FOLVITE) 1 MG tablet Take 1 tablet (1 mg total) by mouth daily. 06/16/19   Curt Bears, MD  ipratropium-albuterol (DUONEB) 0.5-2.5 (3) MG/3ML SOLN Take 3 mLs by nebulization every 6 (six) hours as needed. J44.9 07/11/19   Martyn Ehrich, NP  levothyroxine (SYNTHROID, LEVOTHROID) 200 MCG tablet Take 200 mcg by mouth at bedtime.     [provider]  lidocaine-prilocaine (EMLA) cream Apply 1 application topically as needed. 06/21/19   Curt Bears, MD  losartan (COZAAR) 50 MG tablet Take 50 mg by mouth daily. 06/11/19   [provider]  methotrexate (RHEUMATREX) 2.5 MG tablet Take 15 mg by mouth once a week. Caution:Chemotherapy. Protect from light.    [provider]  mirtazapine (REMERON) 15 MG tablet Take 15 mg by mouth at bedtime.    [provider]  Omega-3 Fatty Acids (FISH OIL) 1200 MG CAPS Take 1,200 mg by mouth at bedtime. 360 MG OMEGA-3    [provider]  omeprazole (PRILOSEC) 40 MG capsule Take 40 mg by mouth at bedtime.     [provider]  predniSONE (DELTASONE) 10 MG tablet Take 10 mg by mouth daily with breakfast.    [provider]  prochlorperazine (COMPAZINE) 10 MG tablet Take 1 tablet (10 mg total) by mouth every 6 (six) hours as needed for nausea or vomiting. 06/16/19   Curt Bears, MD  rOPINIRole (REQUIP) 0.5 MG tablet Take 0.5 mg by mouth at bedtime.    [provider]  Spacer/Aero-Holding Chambers (AEROCHAMBER MV) inhaler Use as instructed 06/01/19   Martyn Ehrich, NP  ticagrelor (BRILINTA) 90 MG TABS tablet Take 1 tablet (90 mg total) by mouth 2 (two) times daily. 05/28/19 08/26/19  Elodia Florence., MD  Vitamin D, Ergocalciferol, (DRISDOL) 1.25 MG (50000  UT) CAPS capsule Take 50,000 Units by mouth every 7 (seven) days.    [provider]   Physical Exam: Blood pressure 134/79, pulse 91, temperature 97.6 F (36.4 C),  temperature source Oral, resp. rate (!) 27, SpO2 98 %. 1. General:  in No Acute distress   Chronically ill  -appearing 2. Psychological: Alert and  Oriented 3. Head/ENT:    Dry Mucous Membranes                          Head Non traumatic, neck supple                           Poor Dentition 4. SKIN:   decreased Skin turgor,  Skin clean Dry and intact no rash 5. Heart: Regular rate and rhythm no  Murmur, no Rub or gallop 6. Lungs:   no wheezes or crackles   7. Abdomen: Soft, non-tender, Non distended bowel sounds present 8. Lower extremities: no clubbing, cyanosis, no edema 9. Neurologically Grossly intact, moving all 4 extremities equally  10. MSK: Normal range of motion   All other LABS:     Recent Labs  Lab 08/02/19 1317 08/05/19 1312  WBC 4.7 7.8  NEUTROABS 1.5*  --   HGB 12.1* 12.7*  HCT 37.3* 39.3  MCV 94.7 95.6  PLT 112* 181     Recent Labs  Lab 08/02/19 1317 08/05/19 1312  NA 141 138  K 3.7 3.8  CL 105 102  CO2 25 24  GLUCOSE 96 127*  BUN 17 20  CREATININE 1.18 1.29*  CALCIUM 9.1 9.0     Recent Labs  Lab 08/02/19 1317  AST 17  ALT 28  ALKPHOS 66  BILITOT 0.2*  PROT 6.7  ALBUMIN 3.5      Cultures:    Component Value Date/Time   SDES URINE, RANDOM 05/25/2019 1713   SPECREQUEST  05/25/2019 1713    NONE Performed at [facility], [place] [address] [place], [place] [address]    CULT >=100,000 COLONIES/mL PSEUDOMONAS AERUGINOSA (A) 05/25/2019 1713   REPTSTATUS 05/27/2019 FINAL 05/25/2019 1713     Radiological Exams on Admission: Dg Chest 2 View  Result Date: 08/05/2019 CLINICAL DATA:  Shortness of breath and fatigue. EXAM: CHEST - 2 VIEW COMPARISON:  July 13, 2019 FINDINGS: The right Port-A-Cath is stable. A right-sided pleural effusion and underlying opacity is stable. No other interval changes. The cardiomediastinal silhouette is stable. IMPRESSION: The right-sided pleural effusion with underlying opacity is stable. No acute interval  changes. Electronically Signed   By: Dorise Bullion III M.D   On: 08/05/2019 14:07   Ct Angio Chest Pe W And/or Wo Contrast  Result Date: 08/05/2019 CLINICAL DATA:  Shortness of breath and fatigue since last night. PE suspected, high pretest probability. EXAM: CT ANGIOGRAPHY CHEST WITH CONTRAST TECHNIQUE: Multidetector CT imaging of the chest was performed using the standard protocol during bolus administration of intravenous contrast. Multiplanar CT image reconstructions and MIPs were obtained to evaluate the vascular anatomy. CONTRAST:  163mL OMNIPAQUE IOHEXOL 350 MG/ML SOLN COMPARISON:  Chest CT angiogram dated 05/23/2019. FINDINGS: Cardiovascular: Majority of the segmental and subsegmental pulmonary arteries cannot be definitively characterized due to patient breathing motion artifact, however, there is at least a single pulmonary embolism within a peripheral subsegmental branch to the posterior segment of the RIGHT lower lobe. Additional peripheral pulmonary emboli suspected. No central obstructing pulmonary embolism identified within  the main or intralobar pulmonary arteries bilaterally. No thoracic aortic aneurysm or evidence of aortic dissection. Diffuse aortic atherosclerosis. Cardiomegaly. No pericardial effusion. Diffuse coronary artery calcifications. Status post median sternotomy for CABG. Mediastinum/Nodes: No mass or enlarged lymph nodes are seen within the mediastinum or perihilar regions. Esophagus is unremarkable.z trachea appears normal. Lungs/Pleura: Chronic atelectasis and pleural thickening at the RIGHT lung base. No evidence of pneumonia or pulmonary edema. No pleural effusion or pneumothorax seen. Upper Abdomen: No acute findings. Musculoskeletal: No acute or suspicious osseous findings. Degenerative spondylosis of the thoracic spine, mild to moderate in degree. Review of the MIP images confirms the above findings. IMPRESSION: 1. At least a single pulmonary embolism within a peripheral  subsegmental branch to the posterior segment of the RIGHT lower lobe. Additional peripheral pulmonary emboli suspected, however, majority of the segmental and subsegmental pulmonary arteries cannot be definitively characterized due to patient breathing motion artifact. No central obstructing pulmonary embolism is identified within the main or intralobar pulmonary arteries bilaterally. 2. Cardiomegaly. No pericardial effusion. 3. Chronic atelectasis/pleural thickening at the RIGHT lung base. No evidence of pneumonia or pulmonary edema. No pleural effusion. Aortic Atherosclerosis (ICD10-I70.0). Electronically Signed   By: Franki Cabot M.D.   On: 08/05/2019 19:37    Chart has been reviewed   Assessment/Plan  85 y.o. male with medical history significant of Malignant pleural mesothelioma, CAD, depression, GERD, HLD, HTN, Cardiomyopathy, ITP, RA, OSA, parkinson disease     Admitted for PE and UTI  Present on Admission:  Pulmonary embolus ([place]) -  Admit to   Telemetry Initiate heparin drip  Would likely benefit from case manager consult for long term anticoagulation Hold home blood pressure medications avoid hypotension Cycle cardiac enzymes Order echogram and lower extremity Dopplers  Most likely risk factors for hypercoagulable state being  Malignancy      Acute lower UTI -  - treat with Rocephin         await results of urine culture and adjust antibiotic coverage as needed   CAD (coronary artery disease) of artery bypass graft -  - chronic, hold aspirin cont brilinta   Hypothyroidism - - Check TSH continue home medications at current dose   PD (Parkinson's disease) ([place]) -   stable continue home medications    Pleural effusion on right - last time patient was drained was yesterday he usually gets it done twice a week    Malignant mesothelioma of pleura ([place]) chronic will notify oncology patient has been admitted   Other plan as per orders.  DVT prophylaxis:  heparin  Code  Status:   DNR/DNI  as per patient    I had personally discussed CODE STATUS with patient    Family Communication:   Family not at  Bedside    Disposition Plan:      To home once workup is complete and patient is stable                                         Consults called:email oncology  Admission status:  ED Disposition    ED Disposition Condition [place]: [place] [100102]  Level of Care: Telemetry [5]  Admit to tele based on following criteria: Other see comments  Comments: pulmonary embolism  Covid Evaluation: Confirmed COVID Negative  Diagnosis: Pulmonary embolism ([place]) [[id number]]  Admitting Physician: Toy Baker [[id number]]  Attending Physician: Toy Baker [[id number]]  PT Class (Do Not Modify): Observation [104]  PT Acc Code (Do Not Modify): Observation [10022]        Obs    Level of care     tele  For  24H        Precautions:  NONE  No active isolations  PPE: Used by the provider:   P100  eye Goggles,  Gloves 08/05/2019, 9:23 PM      Aasiya Creasey    [facility]     after 2 AM please page floor coverage PA If 7AM-7PM, please contact the day team taking care of the patient using [email or web address]

## 2019-08-05 NOTE — Progress Notes (Signed)
ANTICOAGULATION CONSULT NOTE - Initial Consult  Pharmacy Consult for Heparin Indication: pulmonary embolus  Allergies  Allergen Reactions  . Lipitor [Atorvastatin] Other (See Comments)    muscle spasms cramps  . Methylprednisolone Other (See Comments)    cramps cramps  . Morphine And Related     Hallucinations at times  . Other     If patient is to receive blood - blood must be treated witgh radiation because his body will not accept a normal infusion   . Sulfa Antibiotics Itching  . Doxycycline Rash    Doxycycline caused itching redness on scalp and head Doxycycline caused itching redness on scalp and head    Patient Measurements:   Heparin Dosing Weight: 91.5kg (wt from July 2020)  Vital Signs: Temp: 97.6 F (36.4 C) (08/14 1252) Temp Source: Oral (08/14 1252) BP: 123/92 (08/14 2030) Pulse Rate: 97 (08/14 2030)  Labs: Recent Labs    08/05/19 1312  HGB 12.7*  HCT 39.3  PLT 181  CREATININE 1.29*    CrCl cannot be calculated (Unknown ideal weight.).   Medical History: Past Medical History:  Diagnosis Date  . Anxiety   . Aortic stenosis    mild AS by 07/20/13 echo ([facility])  . Arthritis    "all over"  . Coronary artery disease    NSTEMI 06/2010, CABG x3=> [place]->LAD, SVG->OM1, SVG->PDA, DES LCx 10/2011, DES LAD 12/2011  . Depression   . GERD (gastroesophageal reflux disease)   . H/O hiatal hernia   . Hearing aid worn    B/L  . High cholesterol   . History of blood transfusion reaction 06-Mar-2013   "he almost died; he has to get irradiated blood next time"  . Hypertension   . Hypothyroidism   . Ischemic cardiomyopathy   . ITP (idiopathic thrombocytopenic purpura)    Dr. Gaynelle Arabian, on [place]  . Myocardial infarction ([place]) Mar 06, 2010  . Pneumonia 1940's X 1; Mar 06, 2014 X 1  . Rheumatoid arthritis ([place])   . Shortness of breath    with exertion, has not been very active  . Sleep apnea   . Wears glasses   . Wears partial dentures      Medications:  Infusions:  . cefTRIAXone (ROCEPHIN)  IV 1 g (08/05/19 03/07/2043)    Assessment: 85 yo M with malignant mesothelioma presents with shortness of breath.  Chest CT+ PE. Not on anticoagulants PTA.  INR at baseline 06/29/19. CBC- Pltc WNL, Hg slightly low at 12.7 but this appears to be patient's baseline.   Goal of Therapy:  Heparin level 0.3-0.7 units/ml Monitor platelets by anticoagulation protocol: Yes   Plan:  Give 3000 units bolus x 1 Start heparin infusion at 1450 units/hr Check anti-Xa level in 8 hours and daily while on heparin Continue to monitor H&H and platelets  Biagio Borg 08/05/2019,8:49 PM

## 2019-08-05 NOTE — Telephone Encounter (Signed)
"  Jeffrey [name] [phone]).   At 10:30 am my grandfather Jeffrey [name] has chills and fever; Temp = 102.6 .   He's short of breath from his mesothelioma.  Cough is not bringing anything up.  Should we go to the ED and which ED?"    11:18 am.  Instructed to report to [facility] ED.

## 2019-08-06 ENCOUNTER — Observation Stay (HOSPITAL_COMMUNITY): Payer: Medicare Other

## 2019-08-06 DIAGNOSIS — Z87891 Personal history of nicotine dependence: Secondary | ICD-10-CM | POA: Diagnosis not present

## 2019-08-06 DIAGNOSIS — E785 Hyperlipidemia, unspecified: Secondary | ICD-10-CM | POA: Diagnosis present

## 2019-08-06 DIAGNOSIS — I25700 Atherosclerosis of coronary artery bypass graft(s), unspecified, with unstable angina pectoris: Secondary | ICD-10-CM

## 2019-08-06 DIAGNOSIS — I2609 Other pulmonary embolism with acute cor pulmonale: Secondary | ICD-10-CM

## 2019-08-06 DIAGNOSIS — I2699 Other pulmonary embolism without acute cor pulmonale: Secondary | ICD-10-CM | POA: Diagnosis present

## 2019-08-06 DIAGNOSIS — J90 Pleural effusion, not elsewhere classified: Secondary | ICD-10-CM | POA: Diagnosis present

## 2019-08-06 DIAGNOSIS — I340 Nonrheumatic mitral (valve) insufficiency: Secondary | ICD-10-CM

## 2019-08-06 DIAGNOSIS — Z951 Presence of aortocoronary bypass graft: Secondary | ICD-10-CM | POA: Diagnosis not present

## 2019-08-06 DIAGNOSIS — Z7982 Long term (current) use of aspirin: Secondary | ICD-10-CM | POA: Diagnosis not present

## 2019-08-06 DIAGNOSIS — K219 Gastro-esophageal reflux disease without esophagitis: Secondary | ICD-10-CM | POA: Diagnosis present

## 2019-08-06 DIAGNOSIS — A4152 Sepsis due to Pseudomonas: Secondary | ICD-10-CM | POA: Diagnosis present

## 2019-08-06 DIAGNOSIS — M069 Rheumatoid arthritis, unspecified: Secondary | ICD-10-CM | POA: Diagnosis present

## 2019-08-06 DIAGNOSIS — Z96643 Presence of artificial hip joint, bilateral: Secondary | ICD-10-CM | POA: Diagnosis present

## 2019-08-06 DIAGNOSIS — N390 Urinary tract infection, site not specified: Secondary | ICD-10-CM | POA: Diagnosis present

## 2019-08-06 DIAGNOSIS — I252 Old myocardial infarction: Secondary | ICD-10-CM | POA: Diagnosis not present

## 2019-08-06 DIAGNOSIS — I2510 Atherosclerotic heart disease of native coronary artery without angina pectoris: Secondary | ICD-10-CM | POA: Diagnosis present

## 2019-08-06 DIAGNOSIS — J9601 Acute respiratory failure with hypoxia: Secondary | ICD-10-CM | POA: Diagnosis present

## 2019-08-06 DIAGNOSIS — C450 Mesothelioma of pleura: Secondary | ICD-10-CM | POA: Diagnosis present

## 2019-08-06 DIAGNOSIS — Z20828 Contact with and (suspected) exposure to other viral communicable diseases: Secondary | ICD-10-CM | POA: Diagnosis present

## 2019-08-06 DIAGNOSIS — G20 Parkinson's disease: Secondary | ICD-10-CM | POA: Diagnosis not present

## 2019-08-06 DIAGNOSIS — N179 Acute kidney failure, unspecified: Secondary | ICD-10-CM | POA: Diagnosis present

## 2019-08-06 DIAGNOSIS — I255 Ischemic cardiomyopathy: Secondary | ICD-10-CM | POA: Diagnosis present

## 2019-08-06 DIAGNOSIS — E7800 Pure hypercholesterolemia, unspecified: Secondary | ICD-10-CM | POA: Diagnosis present

## 2019-08-06 DIAGNOSIS — F329 Major depressive disorder, single episode, unspecified: Secondary | ICD-10-CM | POA: Diagnosis present

## 2019-08-06 DIAGNOSIS — Z79890 Hormone replacement therapy (postmenopausal): Secondary | ICD-10-CM | POA: Diagnosis not present

## 2019-08-06 DIAGNOSIS — E039 Hypothyroidism, unspecified: Secondary | ICD-10-CM | POA: Diagnosis present

## 2019-08-06 DIAGNOSIS — I10 Essential (primary) hypertension: Secondary | ICD-10-CM | POA: Diagnosis present

## 2019-08-06 DIAGNOSIS — Z7951 Long term (current) use of inhaled steroids: Secondary | ICD-10-CM | POA: Diagnosis not present

## 2019-08-06 LAB — TSH: TSH: 1.345 u[IU]/mL (ref 0.350–4.500)

## 2019-08-06 LAB — CBC
HCT: 36.2 % — ABNORMAL LOW (ref 39.0–52.0)
Hemoglobin: 11.6 g/dL — ABNORMAL LOW (ref 13.0–17.0)
MCH: 30.6 pg (ref 26.0–34.0)
MCHC: 32 g/dL (ref 30.0–36.0)
MCV: 95.5 fL (ref 80.0–100.0)
Platelets: 203 10*3/uL (ref 150–400)
RBC: 3.79 MIL/uL — ABNORMAL LOW (ref 4.22–5.81)
RDW: 17.2 % — ABNORMAL HIGH (ref 11.5–15.5)
WBC: 12 10*3/uL — ABNORMAL HIGH (ref 4.0–10.5)
nRBC: 0.4 % — ABNORMAL HIGH (ref 0.0–0.2)

## 2019-08-06 LAB — COMPREHENSIVE METABOLIC PANEL
ALT: 14 U/L (ref 0–44)
AST: 22 U/L (ref 15–41)
Albumin: 3.3 g/dL — ABNORMAL LOW (ref 3.5–5.0)
Alkaline Phosphatase: 62 U/L (ref 38–126)
Anion gap: 11 (ref 5–15)
BUN: 20 mg/dL (ref 8–23)
CO2: 24 mmol/L (ref 22–32)
Calcium: 8.4 mg/dL — ABNORMAL LOW (ref 8.9–10.3)
Chloride: 102 mmol/L (ref 98–111)
Creatinine, Ser: 1.27 mg/dL — ABNORMAL HIGH (ref 0.61–1.24)
GFR calc Af Amer: >60 mL/min (ref >60)
GFR calc non Af Amer: 52 mL/min — ABNORMAL LOW (ref >60)
Glucose, Bld: 107 mg/dL — ABNORMAL HIGH (ref 70–99)
Potassium: 3.7 mmol/L (ref 3.5–5.1)
Sodium: 137 mmol/L (ref 135–145)
Total Bilirubin: 0.8 mg/dL (ref 0.3–1.2)
Total Protein: 6.9 g/dL (ref 6.5–8.1)

## 2019-08-06 LAB — PHOSPHORUS: Phosphorus: 3.1 mg/dL (ref 2.5–4.6)

## 2019-08-06 LAB — HEPARIN LEVEL (UNFRACTIONATED)
Heparin Unfractionated: 0.85 [IU]/mL — ABNORMAL HIGH (ref 0.30–0.70)
Heparin Unfractionated: 0.4 [IU]/mL (ref 0.30–0.70)

## 2019-08-06 LAB — TROPONIN I (HIGH SENSITIVITY): Troponin I (High Sensitivity): 31 ng/L — ABNORMAL HIGH (ref <18)

## 2019-08-06 LAB — MAGNESIUM: Magnesium: 1.9 mg/dL (ref 1.7–2.4)

## 2019-08-06 LAB — ECHOCARDIOGRAM COMPLETE

## 2019-08-06 MED ORDER — SODIUM CHLORIDE 0.9% FLUSH: 10.0000 mL | INTRAVENOUS | Status: DC | PRN

## 2019-08-06 MED ORDER — SODIUM CHLORIDE 0.9 % IV SOLN
1.0000 g | INTRAVENOUS | Status: DC
  Administered 2019-08-06 – 2019-08-07 (×2): 1 g via INTRAVENOUS
  Filled 2019-08-06 (×2): qty 1

## 2019-08-06 MED ORDER — HEPARIN (PORCINE) 25000 UT/250ML-% IV SOLN
1350.0000 [IU]/h | INTRAVENOUS | Status: DC
  Administered 2019-08-06 – 2019-08-08 (×3): 1350 [IU]/h via INTRAVENOUS
  Filled 2019-08-06 (×3): qty 250

## 2019-08-06 NOTE — Progress Notes (Signed)
[facility] for Heparin Indication: pulmonary embolus Patient Measurements: Height: (S) 6' (182.9 cm) Weight: (S) 201 lb 11.5 oz (91.5 kg) IBW/kg (Calculated) : 77.6 Heparin Dosing Weight: 91.5kg (wt from July 2020)  Vital Signs: BP: 116/101 (08/15 1000) Pulse Rate: 86 (08/15 1000)  Labs: Recent Labs    08/05/19 1312 08/05/19 2124 08/05/19 2324 08/06/19 0500 08/06/19 1009  HGB 12.7*  --   --  11.6*  --   HCT 39.3  --   --  36.2*  --   PLT 181  --   --  203  --   HEPARINUNFRC  --   --   --   --  0.85*  CREATININE 1.29*  --   --  1.27*  --   TROPONINIHS  --  27* 31*  --   --    Assessment: 85 yo M with malignant mesothelioma presents with shortness of breath.  Chest CT+ PE. Not on anticoagulants PTA.  INR at baseline 06/29/19. CBC- Pltc WNL, Hg slightly low at 12.7 but this appears to be patient's baseline.  08/06/2019  Doppler neg for DVT First Heparin level is slightly high at 0.85 after 3000 unit bolus and drip at 1450 units/hr.  No bleeding or infusion problems noted. CBC stable. SCr 1.27  Goal of Therapy:  Heparin level 0.3-0.7 units/ml Monitor platelets by anticoagulation protocol: Yes   Plan:  Decrease heparin to 1350 units/hr and check 8 hr HL Daily HL & CBC F/u transition to oral agent  Eudelia Bunch, Pharm.D [phone] 08/06/2019 10:53 AM

## 2019-08-06 NOTE — Progress Notes (Signed)
[facility]                                                                              Patient Demographics  Jeffrey [name], is a 85 y.o. male, DOB - 1934-10-16, CVE:[id number]  Admit date - 08/05/2019   Admitting Physician Jeffrey Baker, MD  Outpatient Primary MD for the patient is Jeffrey Rad, MD  Outpatient specialists:   LOS - 0  days   Medical records reviewed and are as summarized below:    Chief Complaint  Patient presents with   Shortness of Breath   Fatigue       Brief summary   Patient is 85 year old male with history of malignant pleural mesothelioma, CAD, depression, GERD, hypertension, hyperlipidemia, cardiomyopathy, ITP, OSA presented with fevers, shortness of breath, coughing, fatigue. COVID-19 test negative.  CT angiogram of the chest showed single pulmonary embolism within a peripheral subsegmental branch to the posterior segment of right lower lobe, additional peripheral PE suspected UA positive for UTI Patient was admitted for further work-up.  Assessment & Plan    Principal Problem: Acute right pulmonary embolism (HCC) with mild acute respiratory failure with hypoxia -At the time of my examination O2 sats, 86 to 88% on room air -Continue heparin drip, follow 2D echo, Doppler ultrasound of the lower extremities - will place case management consult for NOAC's   Active Problems: Sepsis secondary to acute lower UTI -Patient met Sepsis criteria at the time of admission with hypotension, lowest BP 86/54 leukocytosis,  AKI, creatinine 1.2 source likely due to UTI -Follow urine culture and sensitivities, continue IV Rocephin -Follow blood cultures    CAD (coronary artery disease), mildly elevated troponin -Hold aspirin, continue Brilinta -Mildly elevated troponin likely due to acute PE  Essential hypertension -BP currently soft, continue to hold losartan -BP currently stable however if drops, will give 1 dose of stress dose  steroids  Mild AKI -Likely due to UTI, creatinine 1.2 -Hold losartan    Hypothyroidism -TSH 1.3 -Continue Synthroid    PD (Parkinson's disease) (HCC) -Continue Sinemet, Xanax  History of  Malignant mesothelioma of pleura (HCC) with chronic right pleural effusion -Last drained on 8/13, usually gets it done twice a week -Outpatient follow-up with oncology  History of rheumatoid arthritis -On chronic prednisone 10 mg daily, hold methotrexate  Code Status: DNR DVT Prophylaxis: Heparin drip Family Communication: Discussed in detail with the patient, all imaging results, lab results explained to the patient    Disposition Plan: Currently multiple issues, borderline BP, acute PE, UTI, sepsis, remains inpatient  Time Spent in minutes 35 minutes  Procedures:  CT angiogram of chest  Consultants:   None  Antimicrobials:   Anti-infectives (From admission, onward)   Start     Dose/Rate Route Frequency Ordered Stop   08/06/19 1800  cefTRIAXone (ROCEPHIN) 1 g in sodium chloride 0.9 % 100 mL IVPB     1 g 200 mL/hr over 30 Minutes Intravenous Every 24 hours 08/06/19 0157     08/05/19 1945  cefTRIAXone (ROCEPHIN) 1 g in sodium chloride 0.9 % 100 mL IVPB     1 g 200 mL/hr over 30  Minutes Intravenous  Once 08/05/19 1935 08/05/19 2152         Medications  Scheduled Meds:  carbidopa-levodopa  1 tablet Oral TID   folic acid  1 mg Oral Daily   levothyroxine  200 mcg Oral QAC breakfast   mirtazapine  15 mg Oral QHS   predniSONE  10 mg Oral Q breakfast   rOPINIRole  0.5 mg Oral QHS   senna  1 tablet Oral BID   ticagrelor  90 mg Oral BID   Continuous Infusions:  cefTRIAXone (ROCEPHIN)  IV     heparin 1,350 Units/hr (08/06/19 1048)   PRN Meds:.acetaminophen **OR** acetaminophen, ALPRAZolam, HYDROcodone-acetaminophen, ipratropium-albuterol, ondansetron **OR** ondansetron (ZOFRAN) IV, polyethylene glycol, sodium chloride flush      Subjective:   Jeffrey [name] was  seen and examined today.  Feeling a little short of breath at the time of my examination.  No fevers.  Has baseline tremor due to Parkinson's.  No chest pain.  Patient denies dizziness,  abdominal pain, N/V/D/C, new weakness, numbess, tingling.   Objective:   Vitals:   08/06/19 1045 08/06/19 1049 08/06/19 1130 08/06/19 1200  BP:  (!) 103/59 (!) 100/49 (!) 141/101  Pulse:  78 76 91  Resp:  16 (!) 21 19  Temp:      TempSrc:      SpO2:  95% 94% 96%  Weight: (S) 91.5 kg     Height: (S) 6' (1.829 m)       Intake/Output Summary (Last 24 hours) at 08/06/2019 1211 Last data filed at 08/06/2019 1048 Gross per 24 hour  Intake 1654.1 ml  Output --  Net 1654.1 ml     Wt Readings from Last 3 Encounters:  08/06/19 (S) 91.5 kg  07/19/19 91.5 kg  07/13/19 92.5 kg     Exam  General: Alert and oriented x 3, NAD  Eyes:   HEENT:  Atraumatic, normocephalic, normal oropharynx  Cardiovascular: S1 S2 auscultated, Regular rate and rhythm.  Respiratory: Clear to auscultation bilaterally, no wheezing, rales or rhonchi  Gastrointestinal: Soft, nontender, nondistended, + bowel sounds  Ext: no pedal edema bilaterally  Neuro: No new deficits, baseline tremor  Musculoskeletal: No digital cyanosis, clubbing  Skin: No rashes  Psych: Normal affect and demeanor, alert and oriented x3    Data Reviewed:  I have personally reviewed following labs and imaging studies  Micro Results Recent Results (from the past 240 hour(s))  Culture, blood (Routine X 2) w Reflex to ID Panel     Status: None (Preliminary result)   Collection Time: 08/05/19  4:45 PM   Specimen: BLOOD  Result Value Ref Range Status   Specimen Description   Final    BLOOD PORTA CATH Performed at [facility] [address], [place], [place] [address]    Special Requests   Final    BOTTLES DRAWN AEROBIC AND ANAEROBIC Blood Culture adequate volume Performed at [facility]  [address] [place], [place] [address]    Culture   Final    NO GROWTH < 24 HOURS Performed at [facility] [address], [place], [place] [address]    Report Status PENDING  Incomplete  Culture, blood (Routine X 2) w Reflex to ID Panel     Status: None (Preliminary result)   Collection Time: 08/05/19  4:45 PM   Specimen: BLOOD  Result Value Ref Range Status   Specimen Description   Final    BLOOD RIGHT ANTECUBITAL Performed at  [facility], [address] [place], [place] [address] °  ° Special Requests   Final  °  BOTTLES DRAWN AEROBIC ONLY Blood Culture results may not be optimal due to an inadequate volume of blood received in culture bottles °Performed at [facility], [address] [place], [place] [address] °  ° Culture   Final  °  NO GROWTH < 24 HOURS °Performed at [facility], [address] [place], [place] [address] °  ° Report Status PENDING  Incomplete  °SARS Coronavirus 2 (Hospital order, Performed in [facility] hospital lab) Nasopharyngeal Nasopharyngeal Swab     Status: None  ° Collection Time: 08/05/19  4:45 PM  ° Specimen: Nasopharyngeal Swab  °Result Value Ref Range Status  ° SARS Coronavirus 2 NEGATIVE NEGATIVE Final  °  Comment: (NOTE) °If result is NEGATIVE °SARS-CoV-2 target nucleic acids are NOT DETECTED. °The SARS-CoV-2 RNA is generally detectable in upper and lower  °respiratory specimens during the acute phase of infection. The lowest  °concentration of SARS-CoV-2 viral copies this assay can detect is 250  °copies / mL. A negative result does not preclude SARS-CoV-2 infection  °and should not be used as the sole basis for treatment or other  °patient management decisions.  A negative result may occur with  °improper specimen collection / handling, submission of specimen other  °than nasopharyngeal swab, presence of viral mutation(s) within the  °areas targeted by this assay, and inadequate number of viral copies  °(<250  copies / mL). A negative result must be combined with clinical  °observations, patient history, and epidemiological information. °If result is POSITIVE °SARS-CoV-2 target nucleic acids are DETECTED. °The SARS-CoV-2 RNA is generally detectable in upper and lower  °respiratory specimens dur °ing the acute phase of infection.  Positive  °results are indicative of active infection with SARS-CoV-2.  Clinical  °correlation with patient history and other diagnostic information is  °necessary to determine patient infection status.  Positive results do  °not rule out bacterial infection or co-infection with other viruses. °If result is PRESUMPTIVE POSTIVE °SARS-CoV-2 nucleic acids MAY BE PRESENT.   °A presumptive positive result was obtained on the submitted specimen  °and confirmed on repeat testing.  While 2019 novel coronavirus  °(SARS-CoV-2) nucleic acids may be present in the submitted sample  °additional confirmatory testing may be necessary for epidemiological  °and / or clinical management purposes  to differentiate between  °SARS-CoV-2 and other Sarbecovirus currently known to infect humans.  °If clinically indicated additional testing with an alternate test  °methodology ([id number]) is advised. The SARS-CoV-2 RNA is generally  °detectable in upper and lower respiratory sp °ecimens during the acute  °phase of infection. °The expected result is Negative. °Fact Sheet for Patients:  https://www.fda.gov/media/136312/download °Fact Sheet for Healthcare Providers: °https://www.fda.gov/media/136313/download °This test is not yet approved or cleared by the [place] FDA and °has been authorized for detection and/or diagnosis of SARS-CoV-2 by °FDA under an Emergency Use Authorization (EUA).  This EUA will remain °in effect (meaning this test can be used) for the duration of the °COVID-19 declaration under Section 564(b)(1) of the Act, 21 U.S.C. °section 360bbb-3(b)(1), unless the authorization is terminated or °revoked  sooner. °Performed at [facility], [address] °[place], [place] [address] °  ° ° °Radiology Reports °Dg Chest 2 View ° °Result Date: 08/05/2019 °CLINICAL DATA:  Shortness of breath and fatigue. EXAM: CHEST - 2 VIEW COMPARISON:  July 13, 2019 FINDINGS: The right   right Port-A-Cath is stable. A right-sided pleural effusion and underlying opacity is stable. No other interval changes. The cardiomediastinal silhouette is stable. IMPRESSION: The right-sided pleural effusion with underlying opacity is stable. No acute interval changes. Electronically Signed   By: Dorise Bullion III M.D   On: 08/05/2019 14:07   Dg Chest 2 View  Result Date: 07/13/2019 CLINICAL DATA:  Right pleural effusion EXAM: CHEST - 2 VIEW COMPARISON:  06/13/2019 FINDINGS: Right PleurX catheter in place. There is small to moderate right pleural effusion, stable since prior study. Right basilar opacity is stable. Left lung clear. Heart is normal size. Mild tortuosity of the thoracic aorta with calcifications. Right Port-A-Cath remains in place, unchanged. IMPRESSION: Stable right pleural effusion and right basilar opacity, likely atelectasis. Right PleurX catheter in place. No pneumothorax. Electronically Signed   By: Rolm Baptise M.D.   On: 07/13/2019 12:53   Ct Angio Chest Pe W And/or Wo Contrast  Result Date: 08/05/2019 CLINICAL DATA:  Shortness of breath and fatigue since last night. PE suspected, high pretest probability. EXAM: CT ANGIOGRAPHY CHEST WITH CONTRAST TECHNIQUE: Multidetector CT imaging of the chest was performed using the standard protocol during bolus administration of intravenous contrast. Multiplanar CT image reconstructions and MIPs were obtained to evaluate the vascular anatomy. CONTRAST:  178m OMNIPAQUE IOHEXOL 350 MG/ML SOLN COMPARISON:  Chest CT angiogram dated 05/23/2019. FINDINGS: Cardiovascular: Majority of the segmental and subsegmental pulmonary arteries cannot be definitively characterized due to  patient breathing motion artifact, however, there is at least a single pulmonary embolism within a peripheral subsegmental branch to the posterior segment of the RIGHT lower lobe. Additional peripheral pulmonary emboli suspected. No central obstructing pulmonary embolism identified within the main or intralobar pulmonary arteries bilaterally. No thoracic aortic aneurysm or evidence of aortic dissection. Diffuse aortic atherosclerosis. Cardiomegaly. No pericardial effusion. Diffuse coronary artery calcifications. Status post median sternotomy for CABG. Mediastinum/Nodes: No mass or enlarged lymph nodes are seen within the mediastinum or perihilar regions. Esophagus is unremarkable.z trachea appears normal. Lungs/Pleura: Chronic atelectasis and pleural thickening at the RIGHT lung base. No evidence of pneumonia or pulmonary edema. No pleural effusion or pneumothorax seen. Upper Abdomen: No acute findings. Musculoskeletal: No acute or suspicious osseous findings. Degenerative spondylosis of the thoracic spine, mild to moderate in degree. Review of the MIP images confirms the above findings. IMPRESSION: 1. At least a single pulmonary embolism within a peripheral subsegmental branch to the posterior segment of the RIGHT lower lobe. Additional peripheral pulmonary emboli suspected, however, majority of the segmental and subsegmental pulmonary arteries cannot be definitively characterized due to patient breathing motion artifact. No central obstructing pulmonary embolism is identified within the main or intralobar pulmonary arteries bilaterally. 2. Cardiomegaly. No pericardial effusion. 3. Chronic atelectasis/pleural thickening at the RIGHT lung base. No evidence of pneumonia or pulmonary edema. No pleural effusion. Aortic Atherosclerosis (ICD10-I70.0). Electronically Signed   By: SFranki CabotM.D.   On: 08/05/2019 19:37   Vas UKoreaLower Extremity Venous (dvt)  Result Date: 08/06/2019  Lower Venous Study Indications:  Pulmonary embolism.  Comparison Study: 05/19/19 negative Performing Technologist: JJune LeapRDMS, RVT  Examination Guidelines: A complete evaluation includes B-mode imaging, spectral Doppler, color Doppler, and power Doppler as needed of all accessible portions of each vessel. Bilateral testing is considered an integral part of a complete examination. Limited examinations for reoccurring indications may be performed as noted.  +---------+---------------+---------+-----------+----------+-------+  RIGHT     Compressibility Phasicity Spontaneity Properties Summary  +---------+---------------+---------+-----------+----------+-------+  CFV  Full            Yes       Yes                             +---------+---------------+---------+-----------+----------+-------+  SFJ       Full                                                      +---------+---------------+---------+-----------+----------+-------+  FV Prox   Full                                                      +---------+---------------+---------+-----------+----------+-------+  FV Mid    Full                                                      +---------+---------------+---------+-----------+----------+-------+  FV Distal Full                                                      +---------+---------------+---------+-----------+----------+-------+  PFV       Full                                                      +---------+---------------+---------+-----------+----------+-------+  POP       Full            Yes       Yes                             +---------+---------------+---------+-----------+----------+-------+  PTV       Full                                                      +---------+---------------+---------+-----------+----------+-------+  PERO      Full                                                      +---------+---------------+---------+-----------+----------+-------+    +---------+---------------+---------+-----------+----------+-------+  LEFT      Compressibility Phasicity Spontaneity Properties Summary  +---------+---------------+---------+-----------+----------+-------+  CFV       Full            Yes       Yes                             +---------+---------------+---------+-----------+----------+-------+    SFJ       Full                                                      +---------+---------------+---------+-----------+----------+-------+  FV Prox   Full                                                      +---------+---------------+---------+-----------+----------+-------+  FV Mid    Full                                                      +---------+---------------+---------+-----------+----------+-------+  FV Distal Full                                                      +---------+---------------+---------+-----------+----------+-------+  PFV       Full                                                      +---------+---------------+---------+-----------+----------+-------+  POP       Full            Yes       Yes                             +---------+---------------+---------+-----------+----------+-------+  PTV       Full                                                      +---------+---------------+---------+-----------+----------+-------+  PERO      Full                                                      +---------+---------------+---------+-----------+----------+-------+     Summary: Right: There is no evidence of deep vein thrombosis in the lower extremity. A cystic structure is found in the popliteal fossa. Left: There is no evidence of deep vein thrombosis in the lower extremity. No cystic structure found in the popliteal fossa. Incidental arterial finding: Left CFA atypical plaque formation anterior wall- possibly ulcerative plaque. Left Pop A significant  focal plaque causing >50% stenosis.  *See table(s) above for measurements and observations.     Preliminary   ° ° °Lab Data: ° °CBC: °Recent Labs  °Lab 08/02/19 °1317 08/05/19 °1312 08/06/19 °0500  °WBC 4.7 7.8 12.0*  °NEUTROABS 1.5*  --   --   °  HGB 12.1* 12.7* 11.6*  °HCT 37.3* 39.3 36.2*  °MCV 94.7 95.6 95.5  °PLT 112* 181 203  ° °Basic Metabolic Panel: °Recent Labs  °Lab 08/02/19 °1317 08/05/19 °1312 08/06/19 °0500  °NA 141 138 137  °K 3.7 3.8 3.7  °CL 105 102 102  °CO2 25 24 24  °GLUCOSE 96 127* 107*  °BUN 17 20 20  °CREATININE 1.18 1.29* 1.27*  °CALCIUM 9.1 9.0 8.4*  °MG  --   --  1.9  °PHOS  --   --  3.1  ° °GFR: °Estimated Creatinine Clearance: 49.2 mL/min (A) (by C-G formula based on SCr of 1.27 mg/dL (H)). °Liver Function Tests: °Recent Labs  °Lab 08/02/19 °1317 08/06/19 °0500  °AST 17 22  °ALT 28 14  °ALKPHOS 66 62  °BILITOT 0.2* 0.8  °PROT 6.7 6.9  °ALBUMIN 3.5 3.3*  ° °No results for input(s): LIPASE, AMYLASE in the last 168 hours. °No results for input(s): AMMONIA in the last 168 hours. °Coagulation Profile: °No results for input(s): INR, PROTIME in the last 168 hours. °Cardiac Enzymes: °No results for input(s): CKTOTAL, CKMB, CKMBINDEX, TROPONINI in the last 168 hours. °BNP (last 3 results) °No results for input(s): PROBNP in the last 8760 hours. °HbA1C: °No results for input(s): HGBA1C in the last 72 hours. °CBG: °No results for input(s): GLUCAP in the last 168 hours. °Lipid Profile: °No results for input(s): CHOL, HDL, LDLCALC, TRIG, CHOLHDL, LDLDIRECT in the last 72 hours. °Thyroid Function Tests: °Recent Labs  °  08/06/19 °0500  °TSH 1.345  ° °Anemia Panel: °No results for input(s): VITAMINB12, FOLATE, FERRITIN, TIBC, IRON, RETICCTPCT in the last 72 hours. °Urine analysis: °   °Component Value Date/Time  ° COLORURINE YELLOW 08/05/2019 1830  ° APPEARANCEUR CLEAR 08/05/2019 1830  ° LABSPEC 1.020 08/05/2019 1830  ° PHURINE 6.0 08/05/2019 1830  ° GLUCOSEU NEGATIVE 08/05/2019 1830  ° HGBUR NEGATIVE 08/05/2019 1830  ° BILIRUBINUR NEGATIVE 08/05/2019 1830  ° KETONESUR NEGATIVE 08/05/2019 1830  °  PROTEINUR 30 (A) 08/05/2019 1830  ° UROBILINOGEN 0.2 11/22/2014 1022  ° NITRITE POSITIVE (A) 08/05/2019 1830  ° LEUKOCYTESUR MODERATE (A) 08/05/2019 1830  ° ° ° °  M.D. °[facility] °08/06/2019, 12:11 PM ° °Pager: [phone] °Between 7am to 7pm - call Pager - [phone] ° °After 7pm go to [email or web address] - password [email or web address] ° °Call night coverage person covering after 7pm ° ° °

## 2019-08-06 NOTE — Progress Notes (Signed)
Pt stated he has not used his CPAP machine in several weeks and does not want to use a CPAP machine while in the hospital. RT will continue to monitor.

## 2019-08-06 NOTE — ED Notes (Signed)
Report given to Jim Like for [place].

## 2019-08-06 NOTE — ED Notes (Signed)
Vascular Ultrasound at Bedside.

## 2019-08-06 NOTE — Progress Notes (Signed)
  Echocardiogram 2D Echocardiogram has been performed.  [name] 08/06/2019, 9:24 AM

## 2019-08-06 NOTE — ED Notes (Addendum)
Pt. Documented in error see note above in chart.

## 2019-08-06 NOTE — ED Notes (Signed)
Assessed pt for PIV placement. Pt difficult stick and unable to place an IV . Order for IV team consult placed.

## 2019-08-06 NOTE — ED Notes (Signed)
ED TO INPATIENT HANDOFF REPORT  Name/Age/Gender Jeffrey [name] 85 y.o. male  Code Status    Code Status Orders  (From admission, onward)         Start     Ordered   08/05/19 2255  Do not attempt resuscitation (DNR)  Continuous    Question Answer Comment  In the event of cardiac or respiratory ARREST Do not call a "code blue"   In the event of cardiac or respiratory ARREST Do not perform Intubation, CPR, defibrillation or ACLS   In the event of cardiac or respiratory ARREST Use medication by any route, position, wound care, and other measures to relive pain and suffering. May use oxygen, suction and manual treatment of airway obstruction as needed for comfort.      08/05/19 2255        Code Status History    Date Active Date Inactive Code Status Order ID Comments User Context   05/20/2019 1227 05/28/2019 2231 Full Code [id number]  Malon Kindle Inpatient   05/14/2019 03-29-24 05/20/2019 1227 Full Code [id number]  Jani Gravel, MD ED   06/29/2018 1403 07/02/2018 1403 Full Code [id number]  Macie Burows Inpatient   11/24/2014 1955 11/26/2014 1722 Full Code [id number]  Rich Fuchs, PA-C Inpatient   08/17/2014 1441 08/20/2014 03/29/06 Full Code [id number]  Rich Fuchs, PA-C Inpatient   06/22/2014 1539 06/24/2014 1941 Full Code [id number]  Rich Fuchs, PA-C Inpatient   01/26/2014 0019 01/27/2014 1915 Full Code [id number]  Oswald Hillock, MD Inpatient   Advance Care Planning Activity      Home/SNF/Other Home  Chief Complaint Fever, SOB, Chills  Level of Care/Admitting Diagnosis ED Disposition    ED Disposition Condition [facility] Area: [place] [[id number]]  Level of Care: Telemetry [5]  Admit to tele based on following criteria: Complex arrhythmia (Bradycardia/Tachycardia)  Covid Evaluation: Confirmed COVID Negative  Diagnosis: Pulmonary embolism ([place]) [[id number]]  Admitting Physician: Toy Baker [[id number]]  Attending Physician: RAI,  RIPUDEEP K [[id number]]  Estimated length of stay: past midnight tomorrow  Certification:: I certify this patient will need inpatient services for at least 2 midnights  PT Class (Do Not Modify): Inpatient [101]  PT Acc Code (Do Not Modify): Private [1]       Medical History Past Medical History:  Diagnosis Date  . Anxiety   . Aortic stenosis    mild AS by 07/20/13 echo ([facility])  . Arthritis    "all over"  . Coronary artery disease    NSTEMI 06/2010, CABG x3=> [place]->LAD, SVG->OM1, SVG->PDA, DES LCx 10/2011, DES LAD 12/2011  . Depression   . GERD (gastroesophageal reflux disease)   . H/O hiatal hernia   . Hearing aid worn    B/L  . High cholesterol   . History of blood transfusion reaction 2013/03/29   "he almost died; he has to get irradiated blood next time"  . Hypertension   . Hypothyroidism   . Ischemic cardiomyopathy   . ITP (idiopathic thrombocytopenic purpura)    Dr. Gaynelle Arabian, on [place]  . Myocardial infarction ([place]) 03-29-2010  . Pneumonia 1940's X 1; 03-29-2014 X 1  . Rheumatoid arthritis ([place])   . Shortness of breath    with exertion, has not been very active  . Sleep apnea   . Wears glasses   . Wears partial dentures     Allergies Allergies  Allergen Reactions  . Lipitor [Atorvastatin] Other (See Comments)  muscle spasms cramps  . Methylprednisolone Other (See Comments)    cramps cramps  . Morphine And Related     Hallucinations at times  . Other     If patient is to receive blood - blood must be treated witgh radiation because his body will not accept a normal infusion   . Sulfa Antibiotics Itching  . Doxycycline Rash    Doxycycline caused itching redness on scalp and head Doxycycline caused itching redness on scalp and head    IV Location/Drains/Wounds Patient Lines/Drains/Airways Status   Active Line/Drains/Airways    Name:   Placement date:   Placement time:   Site:   Days:   Implanted Port 06/29/19 Right Chest   06/29/19    1534    Chest    38   Midline Single Lumen 08/06/19 Midline Left Basilic 10 cm 0 cm   78/46/96    2952    Basilic   less than 1          Labs/Imaging Results for orders placed or performed during the hospital encounter of 08/05/19 (from the past 48 hour(s))  Basic metabolic panel     Status: Abnormal   Collection Time: 08/05/19  1:12 PM  Result Value Ref Range   Sodium 138 135 - 145 mmol/L   Potassium 3.8 3.5 - 5.1 mmol/L   Chloride 102 98 - 111 mmol/L   CO2 24 22 - 32 mmol/L   Glucose, Bld 127 (H) 70 - 99 mg/dL   BUN 20 8 - 23 mg/dL   Creatinine, Ser 1.29 (H) 0.61 - 1.24 mg/dL   Calcium 9.0 8.9 - 10.3 mg/dL   GFR calc non Af Amer 51 (L) >60 mL/min   GFR calc Af Amer 59 (L) >60 mL/min   Anion gap 12 5 - 15    Comment: Performed at [facility], [place] [address], [place], [place] [address]  CBC     Status: Abnormal   Collection Time: 08/05/19  1:12 PM  Result Value Ref Range   WBC 7.8 4.0 - 10.5 K/uL   RBC 4.11 (L) 4.22 - 5.81 MIL/uL   Hemoglobin 12.7 (L) 13.0 - 17.0 g/dL   HCT 39.3 39.0 - 52.0 %   MCV 95.6 80.0 - 100.0 fL   MCH 30.9 26.0 - 34.0 pg   MCHC 32.3 30.0 - 36.0 g/dL   RDW 17.1 (H) 11.5 - 15.5 %   Platelets 181 150 - 400 K/uL   nRBC 0.9 (H) 0.0 - 0.2 %    Comment: Performed at [facility], [place] [address] [place], [place] [address]  Culture, blood (Routine X 2) w Reflex to ID Panel     Status: None (Preliminary result)   Collection Time: 08/05/19  4:45 PM   Specimen: BLOOD  Result Value Ref Range   Specimen Description      BLOOD PORTA CATH Performed at [place] [address], [place], [place] [address]    Special Requests      BOTTLES DRAWN AEROBIC AND ANAEROBIC Blood Culture adequate volume Performed at [place] [address] [place], [place] [address]    Culture      NO GROWTH < 24 HOURS Performed at [place] [address], [place], [place] [address]    Report Status  PENDING   Culture, blood (Routine X 2) w Reflex to ID Panel     Status: None (Preliminary result)  Collection Time: 08/05/19  4:45 PM   Specimen: BLOOD  Result Value Ref Range   Specimen Description      BLOOD RIGHT ANTECUBITAL Performed at [facility], [place] [address], [place], [place] [address]    Special Requests      BOTTLES DRAWN AEROBIC ONLY Blood Culture results may not be optimal due to an inadequate volume of blood received in culture bottles Performed at [place] [address], [place], [place] [address]    Culture      NO GROWTH < 24 HOURS Performed at [place] [address], [place], [place] [address]    Report Status PENDING   Lactic acid, plasma     Status: None   Collection Time: 08/05/19  4:45 PM  Result Value Ref Range   Lactic Acid, Venous 1.7 0.5 - 1.9 mmol/L    Comment: Performed at [facility], [place] [address], [place], [place] [address]  SARS Coronavirus 2 [facility] order, Performed in [facility] hospital lab) Nasopharyngeal Nasopharyngeal Swab     Status: None   Collection Time: 08/05/19  4:45 PM   Specimen: Nasopharyngeal Swab  Result Value Ref Range   SARS Coronavirus 2 NEGATIVE NEGATIVE    Comment: (NOTE) If result is NEGATIVE SARS-CoV-2 target nucleic acids are NOT DETECTED. The SARS-CoV-2 RNA is generally detectable in upper and lower  respiratory specimens during the acute phase of infection. The lowest  concentration of SARS-CoV-2 viral copies this assay can detect is 250  copies / mL. A negative result does not preclude SARS-CoV-2 infection  and should not be used as the sole basis for treatment or other  patient management decisions.  A negative result may occur with  improper specimen collection / handling, submission of specimen other  than nasopharyngeal swab, presence of viral mutation(s) within the  areas targeted by this assay, and inadequate number of viral copies   (<250 copies / mL). A negative result must be combined with clinical  observations, patient history, and epidemiological information. If result is POSITIVE SARS-CoV-2 target nucleic acids are DETECTED. The SARS-CoV-2 RNA is generally detectable in upper and lower  respiratory specimens dur ing the acute phase of infection.  Positive  results are indicative of active infection with SARS-CoV-2.  Clinical  correlation with patient history and other diagnostic information is  necessary to determine patient infection status.  Positive results do  not rule out bacterial infection or co-infection with other viruses. If result is PRESUMPTIVE POSTIVE SARS-CoV-2 nucleic acids MAY BE PRESENT.   A presumptive positive result was obtained on the submitted specimen  and confirmed on repeat testing.  While 2019 novel coronavirus  (SARS-CoV-2) nucleic acids may be present in the submitted sample  additional confirmatory testing may be necessary for epidemiological  and / or clinical management purposes  to differentiate between  SARS-CoV-2 and other Sarbecovirus currently known to infect humans.  If clinically indicated additional testing with an alternate test  methodology [phone]) is advised. The SARS-CoV-2 RNA is generally  detectable in upper and lower respiratory sp ecimens during the acute  phase of infection. The expected result is Negative. Fact Sheet for Patients:  StrictlyIdeas.no Fact Sheet for Healthcare Providers: BankingDealers.co.za This test is not yet approved or cleared by the [place] FDA and has been authorized for detection and/or diagnosis of SARS-CoV-2 by FDA under an Emergency Use Authorization (EUA).  This EUA will remain in effect (meaning this test can be  used) for the duration of the COVID-19 declaration under Section 564(b)(1) of the Act, 21 U.S.C. section 360bbb-3(b)(1), unless the authorization is terminated  or revoked sooner. Performed at [facility], [place] [address], [place], [place] [address]   Urinalysis, Routine w reflex microscopic     Status: Abnormal   Collection Time: 08/05/19  6:30 PM  Result Value Ref Range   Color, Urine YELLOW YELLOW   APPearance CLEAR CLEAR   Specific Gravity, Urine 1.020 1.005 - 1.030   pH 6.0 5.0 - 8.0   Glucose, UA NEGATIVE NEGATIVE mg/dL   Hgb urine dipstick NEGATIVE NEGATIVE   Bilirubin Urine NEGATIVE NEGATIVE   Ketones, ur NEGATIVE NEGATIVE mg/dL   Protein, ur 30 (A) NEGATIVE mg/dL   Nitrite POSITIVE (A) NEGATIVE   Leukocytes,Ua MODERATE (A) NEGATIVE   RBC / HPF 0-5 0 - 5 RBC/hpf   WBC, UA >50 (H) 0 - 5 WBC/hpf   Bacteria, UA RARE (A) NONE SEEN   Squamous Epithelial / LPF 0-5 0 - 5    Comment: Performed at [facility], [place] [address], [place], [place] [address]  Troponin I (High Sensitivity)     Status: Abnormal   Collection Time: 08/05/19  9:24 PM  Result Value Ref Range   Troponin I (High Sensitivity) 27 (H) <18 ng/L    Comment: (NOTE) Elevated high sensitivity troponin I (hsTnI) values and significant  changes across serial measurements may suggest ACS but many other  chronic and acute conditions are known to elevate hsTnI results.  Refer to the "Links" section for chest pain algorithms and additional  guidance. Performed at [facility], [place] [address] [place], [place] [address]   Troponin I (High Sensitivity)     Status: Abnormal   Collection Time: 08/05/19 11:24 PM  Result Value Ref Range   Troponin I (High Sensitivity) 31 (H) <18 ng/L    Comment: (NOTE) Elevated high sensitivity troponin I (hsTnI) values and significant  changes across serial measurements may suggest ACS but many other  chronic and acute conditions are known to elevate hsTnI results.  Refer to the "Links" section for chest pain algorithms and additional  guidance. Performed at [facility], [place] [address] [place], [place] [address]   CBC     Status: Abnormal   Collection Time: 08/06/19  5:00 AM  Result Value Ref Range   WBC 12.0 (H) 4.0 - 10.5 K/uL   RBC 3.79 (L) 4.22 - 5.81 MIL/uL   Hemoglobin 11.6 (L) 13.0 - 17.0 g/dL   HCT 36.2 (L) 39.0 - 52.0 %   MCV 95.5 80.0 - 100.0 fL   MCH 30.6 26.0 - 34.0 pg   MCHC 32.0 30.0 - 36.0 g/dL   RDW 17.2 (H) 11.5 - 15.5 %   Platelets 203 150 - 400 K/uL   nRBC 0.4 (H) 0.0 - 0.2 %    Comment: Performed at [facility], [place] [address], [place], [place] [address]  Magnesium     Status: None   Collection Time: 08/06/19  5:00 AM  Result Value Ref Range   Magnesium 1.9 1.7 - 2.4 mg/dL    Comment: Performed at [facility], [place] [address], [place],  [address]  Phosphorus     Status: None   Collection Time: 08/06/19  5:00 AM  Result Value Ref Range   Phosphorus 3.1 2.5 - 4.6 mg/dL    Comment: Performed at [facility],  [place] [address], [place], [place] [address]  TSH     Status: None   Collection Time: 08/06/19  5:00 AM  Result Value Ref Range   TSH 1.345 0.350 - 4.500 uIU/mL    Comment: Performed by a 3rd Generation assay with a functional sensitivity of <=0.01 uIU/mL. Performed at [facility], [place] [address], [place], [place] [address]   Comprehensive metabolic panel     Status: Abnormal   Collection Time: 08/06/19  5:00 AM  Result Value Ref Range   Sodium 137 135 - 145 mmol/L   Potassium 3.7 3.5 - 5.1 mmol/L   Chloride 102 98 - 111 mmol/L   CO2 24 22 - 32 mmol/L   Glucose, Bld 107 (H) 70 - 99 mg/dL   BUN 20 8 - 23 mg/dL   Creatinine, Ser 1.27 (H) 0.61 - 1.24 mg/dL   Calcium 8.4 (L) 8.9 - 10.3 mg/dL   Total Protein 6.9 6.5 - 8.1 g/dL   Albumin 3.3 (L) 3.5 - 5.0 g/dL   AST 22 15 - 41 U/L   ALT 14 0 - 44 U/L   Alkaline Phosphatase 62 38 - 126 U/L   Total Bilirubin 0.8 0.3 - 1.2 mg/dL   GFR calc non Af Amer 52 (L) >60 mL/min   GFR  calc Af Amer >60 >60 mL/min   Anion gap 11 5 - 15    Comment: Performed at [facility], [place] [address], [place], [place] [address]  Heparin level (unfractionated)     Status: Abnormal   Collection Time: 08/06/19 10:09 AM  Result Value Ref Range   Heparin Unfractionated 0.85 (H) 0.30 - 0.70 IU/mL    Comment: (NOTE) If heparin results are below expected values, and patient dosage has  been confirmed, suggest follow up testing of antithrombin III levels. Performed at [facility], [place] [address], [place], [place] [address]    Dg Chest 2 View  Result Date: 08/05/2019 CLINICAL DATA:  Shortness of breath and fatigue. EXAM: CHEST - 2 VIEW COMPARISON:  July 13, 2019 FINDINGS: The right Port-A-Cath is stable. A right-sided pleural effusion and underlying opacity is stable. No other interval changes. The cardiomediastinal silhouette is stable. IMPRESSION: The right-sided pleural effusion with underlying opacity is stable. No acute interval changes. Electronically Signed   By: Dorise Bullion III M.D   On: 08/05/2019 14:07   Ct Angio Chest Pe W And/or Wo Contrast  Result Date: 08/05/2019 CLINICAL DATA:  Shortness of breath and fatigue since last night. PE suspected, high pretest probability. EXAM: CT ANGIOGRAPHY CHEST WITH CONTRAST TECHNIQUE: Multidetector CT imaging of the chest was performed using the standard protocol during bolus administration of intravenous contrast. Multiplanar CT image reconstructions and MIPs were obtained to evaluate the vascular anatomy. CONTRAST:  149mL OMNIPAQUE IOHEXOL 350 MG/ML SOLN COMPARISON:  Chest CT angiogram dated 05/23/2019. FINDINGS: Cardiovascular: Majority of the segmental and subsegmental pulmonary arteries cannot be definitively characterized due to patient breathing motion artifact, however, there is at least a single pulmonary embolism within a peripheral subsegmental branch to the posterior segment of the RIGHT lower lobe.  Additional peripheral pulmonary emboli suspected. No central obstructing pulmonary embolism identified within the main or intralobar pulmonary arteries bilaterally. No thoracic aortic aneurysm or evidence of aortic dissection. Diffuse aortic atherosclerosis. Cardiomegaly. No pericardial effusion. Diffuse coronary artery calcifications. Status post median sternotomy for CABG. Mediastinum/Nodes: No mass or enlarged lymph nodes are seen within the mediastinum or perihilar regions. Esophagus is  unremarkable.z trachea appears normal. Lungs/Pleura: Chronic atelectasis and pleural thickening at the RIGHT lung base. No evidence of pneumonia or pulmonary edema. No pleural effusion or pneumothorax seen. Upper Abdomen: No acute findings. Musculoskeletal: No acute or suspicious osseous findings. Degenerative spondylosis of the thoracic spine, mild to moderate in degree. Review of the MIP images confirms the above findings. IMPRESSION: 1. At least a single pulmonary embolism within a peripheral subsegmental branch to the posterior segment of the RIGHT lower lobe. Additional peripheral pulmonary emboli suspected, however, majority of the segmental and subsegmental pulmonary arteries cannot be definitively characterized due to patient breathing motion artifact. No central obstructing pulmonary embolism is identified within the main or intralobar pulmonary arteries bilaterally. 2. Cardiomegaly. No pericardial effusion. 3. Chronic atelectasis/pleural thickening at the RIGHT lung base. No evidence of pneumonia or pulmonary edema. No pleural effusion. Aortic Atherosclerosis (ICD10-I70.0). Electronically Signed   By: Franki Cabot M.D.   On: 08/05/2019 19:37   Vas [place] Lower Extremity Venous (dvt)  Result Date: 08/06/2019  Lower Venous Study Indications: Pulmonary embolism.  Comparison Study: 05/19/19 negative Performing Technologist: June Leap RDMS, RVT  Examination Guidelines: A complete evaluation includes B-mode imaging,  spectral Doppler, color Doppler, and power Doppler as needed of all accessible portions of each vessel. Bilateral testing is considered an integral part of a complete examination. Limited examinations for reoccurring indications may be performed as noted.  +---------+---------------+---------+-----------+----------+-------+ RIGHT    CompressibilityPhasicitySpontaneityPropertiesSummary +---------+---------------+---------+-----------+----------+-------+ CFV      Full           Yes      Yes                          +---------+---------------+---------+-----------+----------+-------+ SFJ      Full                                                 +---------+---------------+---------+-----------+----------+-------+ FV Prox  Full                                                 +---------+---------------+---------+-----------+----------+-------+ FV Mid   Full                                                 +---------+---------------+---------+-----------+----------+-------+ FV DistalFull                                                 +---------+---------------+---------+-----------+----------+-------+ PFV      Full                                                 +---------+---------------+---------+-----------+----------+-------+ POP      Full           Yes      Yes                          +---------+---------------+---------+-----------+----------+-------+  PTV      Full                                                 +---------+---------------+---------+-----------+----------+-------+ PERO     Full                                                 +---------+---------------+---------+-----------+----------+-------+   +---------+---------------+---------+-----------+----------+-------+ LEFT     CompressibilityPhasicitySpontaneityPropertiesSummary +---------+---------------+---------+-----------+----------+-------+ CFV      Full           Yes       Yes                          +---------+---------------+---------+-----------+----------+-------+ SFJ      Full                                                 +---------+---------------+---------+-----------+----------+-------+ FV Prox  Full                                                 +---------+---------------+---------+-----------+----------+-------+ FV Mid   Full                                                 +---------+---------------+---------+-----------+----------+-------+ FV DistalFull                                                 +---------+---------------+---------+-----------+----------+-------+ PFV      Full                                                 +---------+---------------+---------+-----------+----------+-------+ POP      Full           Yes      Yes                          +---------+---------------+---------+-----------+----------+-------+ PTV      Full                                                 +---------+---------------+---------+-----------+----------+-------+ PERO     Full                                                 +---------+---------------+---------+-----------+----------+-------+  Summary: Right: There is no evidence of deep vein thrombosis in the lower extremity. A cystic structure is found in the popliteal fossa. Left: There is no evidence of deep vein thrombosis in the lower extremity. No cystic structure found in the popliteal fossa. Incidental arterial finding: Left CFA atypical plaque formation anterior wall- possibly ulcerative plaque. Left Pop A significant  focal plaque causing >50% stenosis.  *See table(s) above for measurements and observations.    Preliminary     Pending Labs Unresulted Labs (From admission, onward)    Start     Ordered   08/06/19 1900  Heparin level (unfractionated)  Once-Timed,   STAT     08/06/19 1058   08/06/19 0500  CBC  Daily,   R     08/05/19 2141   08/05/19  1936  Urine culture  ONCE - STAT,   STAT     08/05/19 1935          Vitals/Pain Today's Vitals   08/06/19 1050 08/06/19 1130 08/06/19 1200 08/06/19 1308  BP:  (!) 100/49 (!) 141/101 110/63  Pulse:  76 91 81  Resp:  (!) 21 19 (!) 23  Temp:      TempSrc:      SpO2:  94% 96% 96%  Weight:      Height:      PainSc: Asleep       Isolation Precautions No active isolations  Medications Medications  ALPRAZolam (XANAX) tablet 0.25 mg (has no administration in time range)  mirtazapine (REMERON) tablet 15 mg (15 mg Oral Given 08/05/19 2335)  levothyroxine (SYNTHROID) tablet 200 mcg (200 mcg Oral Given 08/06/19 0818)  predniSONE (DELTASONE) tablet 10 mg (10 mg Oral Given 3/50/09 3818)  folic acid (FOLVITE) tablet 1 mg (1 mg Oral Given 08/06/19 0929)  ticagrelor (BRILINTA) tablet 90 mg (90 mg Oral Given 08/06/19 0929)  carbidopa-levodopa (SINEMET IR) 25-100 MG per tablet immediate release 1 tablet (1 tablet Oral Given 08/06/19 0931)  rOPINIRole (REQUIP) tablet 0.5 mg (0.5 mg Oral Given 08/06/19 0022)  ipratropium-albuterol (DUONEB) 0.5-2.5 (3) MG/3ML nebulizer solution 3 mL (has no administration in time range)  acetaminophen (TYLENOL) tablet 650 mg (has no administration in time range)    Or  acetaminophen (TYLENOL) suppository 650 mg (has no administration in time range)  HYDROcodone-acetaminophen (NORCO/VICODIN) 5-325 MG per tablet 1-2 tablet (2 tablets Oral Given 08/06/19 0021)  ondansetron (ZOFRAN) tablet 4 mg (has no administration in time range)    Or  ondansetron (ZOFRAN) injection 4 mg (has no administration in time range)  0.9 %  sodium chloride infusion ( Intravenous Stopped 08/06/19 1048)  senna (SENOKOT) tablet 8.6 mg (8.6 mg Oral Given 08/06/19 0931)  polyethylene glycol (MIRALAX / GLYCOLAX) packet 17 g (has no administration in time range)  cefTRIAXone (ROCEPHIN) 1 g in sodium chloride 0.9 % 100 mL IVPB (has no administration in time range)  sodium chloride flush (NS) 0.9 %  injection 10-40 mL (has no administration in time range)  heparin ADULT infusion 100 units/mL (25000 units/224mL sodium chloride 0.45%) (1,350 Units/hr Intravenous New Bag/Given 08/06/19 1309)  sodium chloride 0.9 % bolus 500 mL (0 mLs Intravenous Stopped 08/05/19 1823)  iohexol (OMNIPAQUE) 350 MG/ML injection 100 mL (100 mLs Intravenous Contrast Given 08/05/19 1920)  cefTRIAXone (ROCEPHIN) 1 g in sodium chloride 0.9 % 100 mL IVPB (0 g Intravenous Stopped 08/05/19 2152)  heparin bolus via infusion 3,000 Units (3,000 Units Intravenous Bolus from Bag 08/05/19 2155)    Mobility walks with device

## 2019-08-06 NOTE — Progress Notes (Signed)
LE venous duplex       has been completed. Preliminary results can be found under CV proc through chart review. Cray Monnin, BS, RDMS, RVT   

## 2019-08-06 NOTE — Progress Notes (Signed)
Brief Pharmacy Note re: IV Heparin  85 yo M on IV heparin for +PE  O:  Heparin level 0.4 on heparin 1350 units/hr (goal 0.3-0.7)       No bleeding or infusion related issues reported by RN  P:  Heparin at goal      Continue current rate       Check confirmatory level in 8h (with morning labs)  Netta Cedars, PharmD, BCPS 08/06/2019@9 :29 PM

## 2019-08-07 LAB — CBC
HCT: 31.9 % — ABNORMAL LOW (ref 39.0–52.0)
Hemoglobin: 10.1 g/dL — ABNORMAL LOW (ref 13.0–17.0)
MCH: 30.1 pg (ref 26.0–34.0)
MCHC: 31.7 g/dL (ref 30.0–36.0)
MCV: 94.9 fL (ref 80.0–100.0)
Platelets: 204 10*3/uL (ref 150–400)
RBC: 3.36 MIL/uL — ABNORMAL LOW (ref 4.22–5.81)
RDW: 16.6 % — ABNORMAL HIGH (ref 11.5–15.5)
WBC: 12.1 10*3/uL — ABNORMAL HIGH (ref 4.0–10.5)
nRBC: 0 % (ref 0.0–0.2)

## 2019-08-07 LAB — HEPARIN LEVEL (UNFRACTIONATED): Heparin Unfractionated: 0.53 IU/mL (ref 0.30–0.70)

## 2019-08-07 MED ORDER — TRAZODONE HCL 50 MG PO TABS: 50.0000 mg | ORAL_TABLET | Freq: Every evening | ORAL | Status: DC | PRN

## 2019-08-07 MED ORDER — HYDROCORTISONE NA SUCCINATE PF 100 MG IJ SOLR
50.0000 mg | Freq: Once | INTRAMUSCULAR | Status: AC
  Administered 2019-08-07: 50 mg via INTRAVENOUS
  Filled 2019-08-07: qty 2

## 2019-08-07 NOTE — Progress Notes (Signed)
Patient belongings returned to pt. One brown leather wallet, with credit cards and $179.00 cash. Spouse, [name]  Tripp at bedside. Spouse signed and verified return of items.

## 2019-08-07 NOTE — Progress Notes (Signed)
[facility]                                                                              Patient Demographics  Jeffrey [name], is a 85 y.o. male, DOB - 03/20/1934, TGY:[id number]  Admit date - 08/05/2019   Admitting Physician Toy Baker, MD  Outpatient Primary MD for the patient is [name], MD  Outpatient specialists:   LOS - 1  days   Medical records reviewed and are as summarized below:    Chief Complaint  Patient presents with   Shortness of Breath   Fatigue       Brief summary   Patient is 85 year old male with history of malignant pleural mesothelioma, CAD, depression, GERD, hypertension, hyperlipidemia, cardiomyopathy, ITP, OSA presented with fevers, shortness of breath, coughing, fatigue. COVID-19 test negative.  CT angiogram of the chest showed single pulmonary embolism within a peripheral subsegmental branch to the posterior segment of right lower lobe, additional peripheral PE suspected UA positive for UTI Patient was admitted for further work-up.  Assessment & Plan    Principal Problem: Acute right pulmonary embolism (HCC) with mild acute respiratory failure with hypoxia -No hypoxia, chest pain or shortness of breath.  O2 sats 97% on room air -Continue heparin drip -Doppler ultrasound of the lower extremity showed no DVT -2D echo showed EF of 45%, moderate concentric LVH, impaired relaxation, diffuse hypokinesis, has appointment with Dr. Radford Pax on 08/22/2019 -Case management consulted for NOAC's   Active Problems: Sepsis secondary to acute lower UTI - Patient met Sepsis criteria at the time of admission with hypotension, lowest BP 86/54 leukocytosis,  AKI, creatinine 1.2 source likely due to UTI - Urine culture showed more than 100,000 colonies of gram-negative rods, follow sensitivities, continue IV Rocephin - Blood cultures negative till date    CAD (coronary artery disease), mildly elevated troponin -Hold aspirin,  continue Brilinta -Mildly elevated troponin likely due to acute PE  Hypotension with history of essential hypertension -BP soft, continue to hold losartan - will give IV hydrocortisone 50 mg x 1, patient on chronic prednisone daily for RA  Mild AKI -Likely due to UTI, creatinine 1.2 -Continue to hold ARB, recheck BMET    Hypothyroidism -TSH 1.3 -Continue Synthroid    PD (Parkinson's disease) (HCC) - Continue Sinemet, Xanax  History of  Malignant mesothelioma of pleura (HCC) with chronic right pleural effusion - Last drained on 8/13, usually gets it done twice a week - Outpatient follow-up with oncology - will add Dr Earlie Server to consult- regarding anticoagulation given acute PE in the setting of malignancy. He has appt with Dr. Julien Nordmann on Tuesday 8/18  History of rheumatoid arthritis -On chronic prednisone 10 mg daily, hold methotrexate  Code Status: DNR DVT Prophylaxis: Heparin drip Family Communication: Discussed in detail with the patient, all imaging results, lab results explained to the patient's wife on phone    Disposition Plan: Currently multiple issues, borderline BP, acute PE, UTI, sepsis, remains inpatient  Time Spent in minutes 35 minutes  Procedures:  CT angiogram of chest  Consultants:   None  Antimicrobials:   Anti-infectives (From admission, onward)   Start  Dose/Rate Route Frequency Ordered Stop   08/06/19 2000  cefTRIAXone (ROCEPHIN) 1 g in sodium chloride 0.9 % 100 mL IVPB     1 g 200 mL/hr over 30 Minutes Intravenous Every 24 hours 08/06/19 0157     08/05/19 1945  cefTRIAXone (ROCEPHIN) 1 g in sodium chloride 0.9 % 100 mL IVPB     1 g 200 mL/hr over 30 Minutes Intravenous  Once 08/05/19 1935 08/05/19 2152         Medications  Scheduled Meds:  carbidopa-levodopa  1 tablet Oral TID   folic acid  1 mg Oral Daily   hydrocortisone sod succinate (SOLU-CORTEF) inj  50 mg Intravenous Once   levothyroxine  200 mcg Oral QAC breakfast    mirtazapine  15 mg Oral QHS   predniSONE  10 mg Oral Q breakfast   rOPINIRole  0.5 mg Oral QHS   senna  1 tablet Oral BID   ticagrelor  90 mg Oral BID   Continuous Infusions:  cefTRIAXone (ROCEPHIN)  IV Stopped (08/06/19 2038)   heparin 1,350 Units/hr (08/07/19 0809)   PRN Meds:.acetaminophen **OR** acetaminophen, ALPRAZolam, HYDROcodone-acetaminophen, ipratropium-albuterol, ondansetron **OR** ondansetron (ZOFRAN) IV, polyethylene glycol, sodium chloride flush, traZODone      Subjective:   Vere Diantonio was seen and examined today.  Feels exhausted, did not sleep well last night.  Low-grade temp 99 F.  No chest pain.  Patient denies dizziness,  abdominal pain, N/V/D/C, new weakness, numbess, tingling.   Objective:   Vitals:   08/06/19 1330 08/06/19 1420 08/06/19 2053 08/07/19 0456  BP: 112/60 126/69 114/64 (!) 97/51  Pulse: 79 79 79 67  Resp: 13 (!) _0 Temp:  98.5 F (36.9 C) 99 F (37.2 C) 97.9 F (36.6 C)  TempSrc:  Oral Oral Oral  SpO2: 96% 97% 97% 97%  Weight:      Height:        Intake/Output Summary (Last 24 hours) at 08/07/2019 1050 Last data filed at 08/07/2019 0723 Gross per 24 hour  Intake 345.7 ml  Output 750 ml  Net -404.3 ml     Wt Readings from Last 3 Encounters:  08/06/19 (S) 91.5 kg  07/19/19 91.5 kg  07/13/19 92.5 kg   Physical Exam  General:, sleepy and tired, NAD, oriented  Eyes:  HEENT:   Cardiovascular: S1 S2 clear, RRR. No pedal edema b/l  Respiratory: CTAB, no wheezing, rales or rhonchi  Gastrointestinal: Soft, nontender, nondistended, NBS  Ext: no pedal edema bilaterally  Neuro: no new deficits  Musculoskeletal: No cyanosis, clubbing  Skin: No rashes  Psych: Normal affect and demeanor     Data Reviewed:  I have personally reviewed following labs and imaging studies  Micro Results Recent Results (from the past 240 hour(s))  Culture, blood (Routine X 2) w Reflex to ID Panel     Status: None (Preliminary  result)   Collection Time: 08/05/19  4:45 PM   Specimen: BLOOD  Result Value Ref Range Status   Specimen Description   Final    BLOOD PORTA CATH Performed at [facility], [place] [address], [place], [place] [address]    Special Requests   Final    BOTTLES DRAWN AEROBIC AND ANAEROBIC Blood Culture adequate volume Performed at [facility] [address] [place], [place] [address]    Culture   Final    NO GROWTH < 24 HOURS Performed at [facility] [address], [place], [place] [address]  Report Status PENDING  Incomplete  Culture, blood (Routine X 2) w Reflex to ID Panel     Status: None (Preliminary result)   Collection Time: 08/05/19  4:45 PM   Specimen: BLOOD  Result Value Ref Range Status   Specimen Description   Final    BLOOD RIGHT ANTECUBITAL Performed at [facility] [address] [place], [place] [address]    Special Requests   Final    BOTTLES DRAWN AEROBIC ONLY Blood Culture results may not be optimal due to an inadequate volume of blood received in culture bottles Performed at [facility] [address] [place], [place] [address]    Culture   Final    NO GROWTH < 24 HOURS Performed at [facility] [address], [place], [place] [address]    Report Status PENDING  Incomplete  SARS Coronavirus 2 [facility] order, Performed in [facility] hospital lab) Nasopharyngeal Nasopharyngeal Swab     Status: None   Collection Time: 08/05/19  4:45 PM   Specimen: Nasopharyngeal Swab  Result Value Ref Range Status   SARS Coronavirus 2 NEGATIVE NEGATIVE Final    Comment: (NOTE) If result is NEGATIVE SARS-CoV-2 target nucleic acids are NOT DETECTED. The SARS-CoV-2 RNA is generally detectable in upper and lower  respiratory specimens during the acute phase of infection. The lowest  concentration of SARS-CoV-2 viral copies this assay can detect is 250  copies / mL. A negative  result does not preclude SARS-CoV-2 infection  and should not be used as the sole basis for treatment or other  patient management decisions.  A negative result may occur with  improper specimen collection / handling, submission of specimen other  than nasopharyngeal swab, presence of viral mutation(s) within the  areas targeted by this assay, and inadequate number of viral copies  (<250 copies / mL). A negative result must be combined with clinical  observations, patient history, and epidemiological information. If result is POSITIVE SARS-CoV-2 target nucleic acids are DETECTED. The SARS-CoV-2 RNA is generally detectable in upper and lower  respiratory specimens dur ing the acute phase of infection.  Positive  results are indicative of active infection with SARS-CoV-2.  Clinical  correlation with patient history and other diagnostic information is  necessary to determine patient infection status.  Positive results do  not rule out bacterial infection or co-infection with other viruses. If result is PRESUMPTIVE POSTIVE SARS-CoV-2 nucleic acids MAY BE PRESENT.   A presumptive positive result was obtained on the submitted specimen  and confirmed on repeat testing.  While 2019 novel coronavirus  (SARS-CoV-2) nucleic acids may be present in the submitted sample  additional confirmatory testing may be necessary for epidemiological  and / or clinical management purposes  to differentiate between  SARS-CoV-2 and other Sarbecovirus currently known to infect humans.  If clinically indicated additional testing with an alternate test  methodology [phone]) is advised. The SARS-CoV-2 RNA is generally  detectable in upper and lower respiratory sp ecimens during the acute  phase of infection. The expected result is Negative. Fact Sheet for Patients:  StrictlyIdeas.no Fact Sheet for Healthcare Providers: BankingDealers.co.za This test is not yet  approved or cleared by the [place] FDA and has been authorized for detection and/or diagnosis of SARS-CoV-2 by FDA under an Emergency Use Authorization (EUA).  This EUA will remain in effect (meaning this test can be used) for the duration of the COVID-19 declaration under Section 564(b)(1) of the Act, 21 U.S.C.  section 360bbb-3(b)(1), unless the authorization is terminated or revoked sooner. Performed at [facility], [place] [address], [place], [place] [address]   Urine culture     Status: Abnormal (Preliminary result)   Collection Time: 08/05/19  7:36 PM   Specimen: Urine, Random  Result Value Ref Range Status   Specimen Description   Final    URINE, RANDOM Performed at [facility] [address], [place], [place] [address]    Special Requests   Final    NONE Performed at [facility], [place] [address] [place], [place] [address]    Culture (A)  Final    >=100,000 COLONIES/mL GRAM NEGATIVE RODS CULTURE REINCUBATED FOR BETTER GROWTH Performed at [facility], [place] [address] [place], [place] [address]    Report Status PENDING  Incomplete    Radiology Reports Dg Chest 2 View  Result Date: 08/05/2019 CLINICAL DATA:  Shortness of breath and fatigue. EXAM: CHEST - 2 VIEW COMPARISON:  July 13, 2019 FINDINGS: The right Port-A-Cath is stable. A right-sided pleural effusion and underlying opacity is stable. No other interval changes. The cardiomediastinal silhouette is stable. IMPRESSION: The right-sided pleural effusion with underlying opacity is stable. No acute interval changes. Electronically Signed   By: Dorise Bullion III M.D   On: 08/05/2019 14:07   Dg Chest 2 View  Result Date: 07/13/2019 CLINICAL DATA:  Right pleural effusion EXAM: CHEST - 2 VIEW COMPARISON:  06/13/2019 FINDINGS: Right PleurX catheter in place. There is small to moderate right pleural effusion, stable since prior study. Right basilar opacity is  stable. Left lung clear. Heart is normal size. Mild tortuosity of the thoracic aorta with calcifications. Right Port-A-Cath remains in place, unchanged. IMPRESSION: Stable right pleural effusion and right basilar opacity, likely atelectasis. Right PleurX catheter in place. No pneumothorax. Electronically Signed   By: Rolm Baptise M.D.   On: 07/13/2019 12:53   Ct Angio Chest Pe W And/or Wo Contrast  Result Date: 08/05/2019 CLINICAL DATA:  Shortness of breath and fatigue since last night. PE suspected, high pretest probability. EXAM: CT ANGIOGRAPHY CHEST WITH CONTRAST TECHNIQUE: Multidetector CT imaging of the chest was performed using the standard protocol during bolus administration of intravenous contrast. Multiplanar CT image reconstructions and MIPs were obtained to evaluate the vascular anatomy. CONTRAST:  125m OMNIPAQUE IOHEXOL 350 MG/ML SOLN COMPARISON:  Chest CT angiogram dated 05/23/2019. FINDINGS: Cardiovascular: Majority of the segmental and subsegmental pulmonary arteries cannot be definitively characterized due to patient breathing motion artifact, however, there is at least a single pulmonary embolism within a peripheral subsegmental branch to the posterior segment of the RIGHT lower lobe. Additional peripheral pulmonary emboli suspected. No central obstructing pulmonary embolism identified within the main or intralobar pulmonary arteries bilaterally. No thoracic aortic aneurysm or evidence of aortic dissection. Diffuse aortic atherosclerosis. Cardiomegaly. No pericardial effusion. Diffuse coronary artery calcifications. Status post median sternotomy for CABG. Mediastinum/Nodes: No mass or enlarged lymph nodes are seen within the mediastinum or perihilar regions. Esophagus is unremarkable.z trachea appears normal. Lungs/Pleura: Chronic atelectasis and pleural thickening at the RIGHT lung base. No evidence of pneumonia or pulmonary edema. No pleural effusion or pneumothorax seen. Upper Abdomen: No  acute findings. Musculoskeletal: No acute or suspicious osseous findings. Degenerative spondylosis of the thoracic spine, mild to moderate in degree. Review of the MIP images confirms the above findings. IMPRESSION: 1. At least a single pulmonary embolism within a peripheral subsegmental branch to the posterior segment of the RIGHT lower  lobe. Additional peripheral pulmonary emboli suspected, however, majority of the segmental and subsegmental pulmonary arteries cannot be definitively characterized due to patient breathing motion artifact. No central obstructing pulmonary embolism is identified within the main or intralobar pulmonary arteries bilaterally. 2. Cardiomegaly. No pericardial effusion. 3. Chronic atelectasis/pleural thickening at the RIGHT lung base. No evidence of pneumonia or pulmonary edema. No pleural effusion. Aortic Atherosclerosis (ICD10-I70.0). Electronically Signed   By: Franki Cabot M.D.   On: 08/05/2019 19:37   Vas [place] Lower Extremity Venous (dvt)  Result Date: 08/06/2019  Lower Venous Study Indications: Pulmonary embolism.  Comparison Study: 05/19/19 negative Performing Technologist: June Leap RDMS, RVT  Examination Guidelines: A complete evaluation includes B-mode imaging, spectral Doppler, color Doppler, and power Doppler as needed of all accessible portions of each vessel. Bilateral testing is considered an integral part of a complete examination. Limited examinations for reoccurring indications may be performed as noted.  +---------+---------------+---------+-----------+----------+-------+  RIGHT     Compressibility Phasicity Spontaneity Properties Summary  +---------+---------------+---------+-----------+----------+-------+  CFV       Full            Yes       Yes                             +---------+---------------+---------+-----------+----------+-------+  SFJ       Full                                                       +---------+---------------+---------+-----------+----------+-------+  FV Prox   Full                                                      +---------+---------------+---------+-----------+----------+-------+  FV Mid    Full                                                      +---------+---------------+---------+-----------+----------+-------+  FV Distal Full                                                      +---------+---------------+---------+-----------+----------+-------+  PFV       Full                                                      +---------+---------------+---------+-----------+----------+-------+  POP       Full            Yes       Yes                             +---------+---------------+---------+-----------+----------+-------+  PTV  Full                                                      +---------+---------------+---------+-----------+----------+-------+  PERO      Full                                                      +---------+---------------+---------+-----------+----------+-------+   +---------+---------------+---------+-----------+----------+-------+  LEFT      Compressibility Phasicity Spontaneity Properties Summary  +---------+---------------+---------+-----------+----------+-------+  CFV       Full            Yes       Yes                             +---------+---------------+---------+-----------+----------+-------+  SFJ       Full                                                      +---------+---------------+---------+-----------+----------+-------+  FV Prox   Full                                                      +---------+---------------+---------+-----------+----------+-------+  FV Mid    Full                                                      +---------+---------------+---------+-----------+----------+-------+  FV Distal Full                                                      +---------+---------------+---------+-----------+----------+-------+  PFV       Full                                                       +---------+---------------+---------+-----------+----------+-------+  POP       Full            Yes       Yes                             +---------+---------------+---------+-----------+----------+-------+  PTV       Full                                                      +---------+---------------+---------+-----------+----------+-------+  PERO      Full                                                      +---------+---------------+---------+-----------+----------+-------+     Summary: Right: There is no evidence of deep vein thrombosis in the lower extremity. A cystic structure is found in the popliteal fossa. Left: There is no evidence of deep vein thrombosis in the lower extremity. No cystic structure found in the popliteal fossa. Incidental arterial finding: Left CFA atypical plaque formation anterior wall- possibly ulcerative plaque. Left Pop A significant  focal plaque causing >50% stenosis.  *See table(s) above for measurements and observations.    Preliminary     Lab Data:  CBC: Recent Labs  Lab 08/02/19 1317 08/05/19 1312 08/06/19 0500 08/07/19 0418  WBC 4.7 7.8 12.0* 12.1*  NEUTROABS 1.5*  --   --   --   HGB 12.1* 12.7* 11.6* 10.1*  HCT 37.3* 39.3 36.2* 31.9*  MCV 94.7 95.6 95.5 94.9  PLT 112* 181 203 726   Basic Metabolic Panel: Recent Labs  Lab 08/02/19 1317 08/05/19 1312 08/06/19 0500  NA 141 138 137  K 3.7 3.8 3.7  CL 105 102 102  CO2 _0 GLUCOSE 96 127* 107*  BUN _1 CREATININE 1.18 1.29* 1.27*  CALCIUM 9.1 9.0 8.4*  MG  --   --  1.9  PHOS  --   --  3.1   GFR: Estimated Creatinine Clearance: 49.2 mL/min (A) (by C-G formula based on SCr of 1.27 mg/dL (H)). Liver Function Tests: Recent Labs  Lab 08/02/19 1317 08/06/19 0500  AST 17 22  ALT 28 14  ALKPHOS 66 62  BILITOT 0.2* 0.8  PROT 6.7 6.9  ALBUMIN 3.5 3.3*   No results for input(s): LIPASE, AMYLASE in the last 168 hours. No results  for input(s): AMMONIA in the last 168 hours. Coagulation Profile: No results for input(s): INR, PROTIME in the last 168 hours. Cardiac Enzymes: No results for input(s): CKTOTAL, CKMB, CKMBINDEX, TROPONINI in the last 168 hours. BNP (last 3 results) No results for input(s): PROBNP in the last 8760 hours. HbA1C: No results for input(s): HGBA1C in the last 72 hours. CBG: No results for input(s): GLUCAP in the last 168 hours. Lipid Profile: No results for input(s): CHOL, HDL, LDLCALC, TRIG, CHOLHDL, LDLDIRECT in the last 72 hours. Thyroid Function Tests: Recent Labs    08/06/19 0500  TSH 1.345   Anemia Panel: No results for input(s): VITAMINB12, FOLATE, FERRITIN, TIBC, IRON, RETICCTPCT in the last 72 hours. Urine analysis:    Component Value Date/Time   COLORURINE YELLOW 08/05/2019 [place] 08/05/2019 1830   LABSPEC 1.020 08/05/2019 1830   PHURINE 6.0 08/05/2019 1830   GLUCOSEU NEGATIVE 08/05/2019 1830   HGBUR NEGATIVE 08/05/2019 1830   BILIRUBINUR NEGATIVE 08/05/2019 1830   KETONESUR NEGATIVE 08/05/2019 1830   PROTEINUR 30 (A) 08/05/2019 1830   UROBILINOGEN 0.2 11/22/2014 1022   NITRITE POSITIVE (A) 08/05/2019 1830   LEUKOCYTESUR MODERATE (A) 08/05/2019 1830       M.D. [facility] 08/07/2019, 10:50 AM  Pager: [phone] Between 7am to 7pm - call Pager - [phone]  After 7pm go to [email or web address] - password [email or web address]  Call night coverage person covering after 7pm

## 2019-08-07 NOTE — Progress Notes (Signed)
[facility] for Heparin Indication: pulmonary embolus Patient Measurements: Height: (S) 6' (182.9 cm) Weight: (S) 201 lb 11.5 oz (91.5 kg) IBW/kg (Calculated) : 77.6 Heparin Dosing Weight = TBW = 91.5kg   Vital Signs: Temp: 97.9 F (36.6 C) (08/16 0456) Temp Source: Oral (08/16 0456) BP: 97/51 (08/16 0456) Pulse Rate: 67 (08/16 0456)  Labs: Recent Labs    08/05/19 1312 08/05/19 2124 08/05/19 2324 08/06/19 0500 08/06/19 1009 08/06/19 1900 08/07/19 0418  HGB 12.7*  --   --  11.6*  --   --  10.1*  HCT 39.3  --   --  36.2*  --   --  31.9*  PLT 181  --   --  203  --   --  204  HEPARINUNFRC  --   --   --   --  0.85* 0.40 0.53  CREATININE 1.29*  --   --  1.27*  --   --   --   TROPONINIHS  --  27* 31*  --   --   --   --    Assessment: 85 yo M with malignant mesothelioma presents with shortness of breath.  Chest CT+ PE. Not on anticoagulants PTA.  INR at baseline 06/29/19. CBC- Pltc WNL, Hg slightly low at 12.7 but this appears to be patient's baseline.   08/07/2019  Doppler neg for DVT  Today, 08/07/19  Confirmatory HL = 0.53 remains therapeutic on heparin infusion of 1350 units/hr  Hgb 10.1 - slightly decreased  Plt - WNL, stable  Confirmed with RN that heparin infusing at correct rate. No issues with infusion, no signs/symptoms of bleeding.  Goal of Therapy:  Heparin level 0.3-0.7 units/ml Monitor platelets by anticoagulation protocol: Yes   Plan:   Continue heparin infusion at current rate of 1350 units/hr  Daily HL and CBC  Follow for transition to oral anticoagulation  Lenis Noon, PharmD 08/07/19 5:18 AM

## 2019-08-08 ENCOUNTER — Telehealth: Payer: Self-pay | Admitting: Internal Medicine

## 2019-08-08 DIAGNOSIS — J90 Pleural effusion, not elsewhere classified: Secondary | ICD-10-CM

## 2019-08-08 DIAGNOSIS — I2699 Other pulmonary embolism without acute cor pulmonale: Secondary | ICD-10-CM

## 2019-08-08 DIAGNOSIS — C450 Mesothelioma of pleura: Secondary | ICD-10-CM

## 2019-08-08 LAB — CBC
HCT: 31.1 % — ABNORMAL LOW (ref 39.0–52.0)
Hemoglobin: 9.9 g/dL — ABNORMAL LOW (ref 13.0–17.0)
MCH: 30 pg (ref 26.0–34.0)
MCHC: 31.8 g/dL (ref 30.0–36.0)
MCV: 94.2 fL (ref 80.0–100.0)
Platelets: 247 10*3/uL (ref 150–400)
RBC: 3.3 MIL/uL — ABNORMAL LOW (ref 4.22–5.81)
RDW: 16.2 % — ABNORMAL HIGH (ref 11.5–15.5)
WBC: 12.4 10*3/uL — ABNORMAL HIGH (ref 4.0–10.5)
nRBC: 0 % (ref 0.0–0.2)

## 2019-08-08 LAB — URINE CULTURE: Culture: >=100000 — AB

## 2019-08-08 LAB — HEPARIN LEVEL (UNFRACTIONATED): Heparin Unfractionated: 0.53 IU/mL (ref 0.30–0.70)

## 2019-08-08 MED ORDER — CIPROFLOXACIN HCL 500 MG PO TABS
500.0000 mg | ORAL_TABLET | Freq: Two times a day (BID) | ORAL | Status: DC
  Administered 2019-08-08 – 2019-08-09 (×3): 500 mg via ORAL
  Filled 2019-08-08 (×3): qty 1

## 2019-08-08 MED ORDER — ENSURE ENLIVE PO LIQD
237.0000 mL | Freq: Two times a day (BID) | ORAL | Status: DC
  Administered 2019-08-08: 237 mL via ORAL

## 2019-08-08 MED ORDER — RIVAROXABAN 20 MG PO TABS: 20.0000 mg | ORAL_TABLET | Freq: Every day | ORAL | Status: DC

## 2019-08-08 MED ORDER — RIVAROXABAN 15 MG PO TABS
15.0000 mg | ORAL_TABLET | Freq: Two times a day (BID) | ORAL | Status: DC
  Administered 2019-08-08 – 2019-08-09 (×3): 15 mg via ORAL
  Filled 2019-08-08 (×3): qty 1

## 2019-08-08 NOTE — Progress Notes (Signed)
HEMATOLOGY-ONCOLOGY PROGRESS NOTE  SUBJECTIVE: Jeffrey [name] is an 85 year old male followed by Dr. Earlie Server for malignant pleural mesothelioma.  He is currently on systemic chemotherapy with carboplatin for an AUC of 5 and Alimta 500 mg meter squared every 3 weeks.  He is status post 2 cycles.  Last cycle was given on 07/19/2019.  The patient presented to the emergency room with fever, shortness of breath, cough, and fatigue.  A CT angiogram the chest showed a single pulmonary embolism with peripheral sub-segmental branch to the posterior segment of the right lower lobe.  He was also found to have a urinary tract infection.  Reports that he feels better overall. Remains on heparin drip and will transition to Xarelto soon. Fevers have resolved. Breathing better. Draining Pleurex catheter at the time of my visit. No bleeding.   Oncology History  Malignant mesothelioma of pleura ([place])  06/16/2019 Initial Diagnosis   Malignant mesothelioma of pleura ([place])   07/01/2019 -  Chemotherapy   The patient had palonosetron (ALOXI) injection 0.25 mg, 0.25 mg, Intravenous,  Once, 2 of 6 cycles Administration: 0.25 mg (07/01/2019), 0.25 mg (07/19/2019) PEMEtrexed (ALIMTA) 1,100 mg in sodium chloride 0.9 % 100 mL chemo infusion, 510 mg/m2 = 1,075 mg, Intravenous,  Once, 2 of 6 cycles Administration: 1,100 mg (07/01/2019), 1,100 mg (07/19/2019) CARBOplatin (PARAPLATIN) 410 mg in sodium chloride 0.9 % 250 mL chemo infusion, 410 mg (100 % of original dose 406.5 mg), Intravenous,  Once, 2 of 6 cycles Dose modification: 406.5 mg (original dose 406.5 mg, Cycle 1), 435 mg (original dose 435 mg, Cycle 2) Administration: 410 mg (07/01/2019), 440 mg (07/19/2019) fosaprepitant (EMEND) 150 mg, dexamethasone (DECADRON) 12 mg in sodium chloride 0.9 % 145 mL IVPB, , Intravenous,  Once, 2 of 6 cycles Administration:  (07/01/2019),  (07/19/2019)  for chemotherapy treatment.       REVIEW OF SYSTEMS:   Constitutional: Fevers and chills  have resolved Respiratory:  Shortness of breath improved Cardiovascular: Denies palpitation, chest discomfort Gastrointestinal:  Denies nausea, heartburn or change in bowel habits Skin: Denies abnormal skin rashes Lymphatics: Denies new lymphadenopathy or easy bruising Neurological:Denies numbness, tingling or new weaknesses Behavioral/Psych: Mood is stable, no new changes  Extremities: No lower extremity edema All other systems were reviewed with the patient and are negative.  I have reviewed the past medical history, past surgical history, social history and family history with the patient and they are unchanged from previous note.   PHYSICAL EXAMINATION: ECOG PERFORMANCE STATUS: 1 - Symptomatic but completely ambulatory  Vitals:   08/07/19 1945 08/08/19 0536  BP: 130/84 127/72  Pulse: 83 64  Resp: 18 18  Temp: 98.2 F (36.8 C) 97.7 F (36.5 C)  SpO2: 99% 98%   Filed Weights   08/06/19 1045  Weight: (S) 201 lb 11.5 oz (91.5 kg)    Intake/Output from previous day: 08/16 0701 - 08/17 0700 In: 573.9 [P.O.:150; I.V.:323.9; IV Piggyback:100] Out: 725 [Urine:725]  GENERAL:alert, no distress and comfortable LYMPH:  no palpable lymphadenopathy in the cervical, axillary or inguinal LUNGS: diminished right posterior base with normal breathing effort HEART: regular rate & rhythm and no murmurs and no lower extremity edema ABDOMEN:abdomen soft, non-tender and normal bowel sounds Musculoskeletal:no cyanosis of digits and no clubbing  NEURO: alert & oriented x 3 with fluent speech, no focal motor/sensory deficits  LABORATORY DATA:  I have reviewed the data as listed CMP Latest Ref Rng & Units 08/06/2019 08/05/2019 08/02/2019  Glucose 70 - 99 mg/dL 107(H) 127(H) 96  BUN 8 - 23 mg/dL 20 20 17   Creatinine 0.61 - 1.24 mg/dL 1.27(H) 1.29(H) 1.18  Sodium 135 - 145 mmol/L 137 138 141  Potassium 3.5 - 5.1 mmol/L 3.7 3.8 3.7  Chloride 98 - 111 mmol/L 102 102 105  CO2 22 - 32 mmol/L 24  24 25   Calcium 8.9 - 10.3 mg/dL 8.4(L) 9.0 9.1  Total Protein 6.5 - 8.1 g/dL 6.9 - 6.7  Total Bilirubin 0.3 - 1.2 mg/dL 0.8 - 0.2(L)  Alkaline Phos 38 - 126 U/L 62 - 66  AST 15 - 41 U/L 22 - 17  ALT 0 - 44 U/L 14 - 28    Lab Results  Component Value Date   WBC 12.4 (H) 08/08/2019   HGB 9.9 (L) 08/08/2019   HCT 31.1 (L) 08/08/2019   MCV 94.2 08/08/2019   PLT 247 08/08/2019   NEUTROABS 1.5 (L) 08/02/2019    Dg Chest 2 View  Result Date: 08/05/2019 CLINICAL DATA:  Shortness of breath and fatigue. EXAM: CHEST - 2 VIEW COMPARISON:  July 13, 2019 FINDINGS: The right Port-A-Cath is stable. A right-sided pleural effusion and underlying opacity is stable. No other interval changes. The cardiomediastinal silhouette is stable. IMPRESSION: The right-sided pleural effusion with underlying opacity is stable. No acute interval changes. Electronically Signed   By: Dorise Bullion III M.D   On: 08/05/2019 14:07   Dg Chest 2 View  Result Date: 07/13/2019 CLINICAL DATA:  Right pleural effusion EXAM: CHEST - 2 VIEW COMPARISON:  06/13/2019 FINDINGS: Right PleurX catheter in place. There is small to moderate right pleural effusion, stable since prior study. Right basilar opacity is stable. Left lung clear. Heart is normal size. Mild tortuosity of the thoracic aorta with calcifications. Right Port-A-Cath remains in place, unchanged. IMPRESSION: Stable right pleural effusion and right basilar opacity, likely atelectasis. Right PleurX catheter in place. No pneumothorax. Electronically Signed   By: Rolm Baptise M.D.   On: 07/13/2019 12:53   Ct Angio Chest Pe W And/or Wo Contrast  Result Date: 08/05/2019 CLINICAL DATA:  Shortness of breath and fatigue since last night. PE suspected, high pretest probability. EXAM: CT ANGIOGRAPHY CHEST WITH CONTRAST TECHNIQUE: Multidetector CT imaging of the chest was performed using the standard protocol during bolus administration of intravenous contrast. Multiplanar CT image  reconstructions and MIPs were obtained to evaluate the vascular anatomy. CONTRAST:  144mL OMNIPAQUE IOHEXOL 350 MG/ML SOLN COMPARISON:  Chest CT angiogram dated 05/23/2019. FINDINGS: Cardiovascular: Majority of the segmental and subsegmental pulmonary arteries cannot be definitively characterized due to patient breathing motion artifact, however, there is at least a single pulmonary embolism within a peripheral subsegmental branch to the posterior segment of the RIGHT lower lobe. Additional peripheral pulmonary emboli suspected. No central obstructing pulmonary embolism identified within the main or intralobar pulmonary arteries bilaterally. No thoracic aortic aneurysm or evidence of aortic dissection. Diffuse aortic atherosclerosis. Cardiomegaly. No pericardial effusion. Diffuse coronary artery calcifications. Status post median sternotomy for CABG. Mediastinum/Nodes: No mass or enlarged lymph nodes are seen within the mediastinum or perihilar regions. Esophagus is unremarkable.z trachea appears normal. Lungs/Pleura: Chronic atelectasis and pleural thickening at the RIGHT lung base. No evidence of pneumonia or pulmonary edema. No pleural effusion or pneumothorax seen. Upper Abdomen: No acute findings. Musculoskeletal: No acute or suspicious osseous findings. Degenerative spondylosis of the thoracic spine, mild to moderate in degree. Review of the MIP images confirms the above findings. IMPRESSION: 1. At least a single pulmonary embolism within a peripheral subsegmental branch to the  posterior segment of the RIGHT lower lobe. Additional peripheral pulmonary emboli suspected, however, majority of the segmental and subsegmental pulmonary arteries cannot be definitively characterized due to patient breathing motion artifact. No central obstructing pulmonary embolism is identified within the main or intralobar pulmonary arteries bilaterally. 2. Cardiomegaly. No pericardial effusion. 3. Chronic atelectasis/pleural  thickening at the RIGHT lung base. No evidence of pneumonia or pulmonary edema. No pleural effusion. Aortic Atherosclerosis (ICD10-I70.0). Electronically Signed   By: Franki Cabot M.D.   On: 08/05/2019 19:37   Vas [place] Lower Extremity Venous (dvt)  Result Date: 08/07/2019  Lower Venous Study Indications: Pulmonary embolism.  Comparison Study: 05/19/19 negative Performing Technologist: June Leap RDMS, RVT  Examination Guidelines: A complete evaluation includes B-mode imaging, spectral Doppler, color Doppler, and power Doppler as needed of all accessible portions of each vessel. Bilateral testing is considered an integral part of a complete examination. Limited examinations for reoccurring indications may be performed as noted.  +---------+---------------+---------+-----------+----------+-------+ RIGHT    CompressibilityPhasicitySpontaneityPropertiesSummary +---------+---------------+---------+-----------+----------+-------+ CFV      Full           Yes      Yes                          +---------+---------------+---------+-----------+----------+-------+ SFJ      Full                                                 +---------+---------------+---------+-----------+----------+-------+ FV Prox  Full                                                 +---------+---------------+---------+-----------+----------+-------+ FV Mid   Full                                                 +---------+---------------+---------+-----------+----------+-------+ FV DistalFull                                                 +---------+---------------+---------+-----------+----------+-------+ PFV      Full                                                 +---------+---------------+---------+-----------+----------+-------+ POP      Full           Yes      Yes                          +---------+---------------+---------+-----------+----------+-------+ PTV      Full                                                  +---------+---------------+---------+-----------+----------+-------+ PERO  Full                                                 +---------+---------------+---------+-----------+----------+-------+   +---------+---------------+---------+-----------+----------+-------+ LEFT     CompressibilityPhasicitySpontaneityPropertiesSummary +---------+---------------+---------+-----------+----------+-------+ CFV      Full           Yes      Yes                          +---------+---------------+---------+-----------+----------+-------+ SFJ      Full                                                 +---------+---------------+---------+-----------+----------+-------+ FV Prox  Full                                                 +---------+---------------+---------+-----------+----------+-------+ FV Mid   Full                                                 +---------+---------------+---------+-----------+----------+-------+ FV DistalFull                                                 +---------+---------------+---------+-----------+----------+-------+ PFV      Full                                                 +---------+---------------+---------+-----------+----------+-------+ POP      Full           Yes      Yes                          +---------+---------------+---------+-----------+----------+-------+ PTV      Full                                                 +---------+---------------+---------+-----------+----------+-------+ PERO     Full                                                 +---------+---------------+---------+-----------+----------+-------+     Summary: Right: There is no evidence of deep vein thrombosis in the lower extremity. A cystic structure is found in the popliteal fossa. Left: There is no evidence of deep vein thrombosis in the lower extremity. No cystic structure found in the popliteal  fossa. Incidental arterial finding: Left CFA atypical plaque formation anterior wall-  possibly ulcerative plaque. Left Pop A significant  focal plaque causing >50% stenosis.  *See table(s) above for measurements and observations. Electronically signed by Ruta Hinds MD on 08/07/2019 at 12:13:52 PM.    Final     ASSESSMENT AND PLAN: 1.  Malignant mesothelioma of the pleura 2.  Pulmonary embolism 3.  Pleural effusion 4.  Urinary tract infection 5.  Anemia due to recent chemo 6.  Parkinson's disease 7.  History of rheumatoid arthritis  -Patient due for next cycle of chemo 8/18. Will delay by 1 week due to hospitalization and UTI. Scheduling message sent to [employer] to reschedule.  -Agree with Xarelto for treatment of PE. -Continue drainage of Pleurex catheter per [name]. -Continue antibiotics per hospitalist for UTI -No PRBC transfusion indicated at this time.   Will follow-up with patient after discharge. Family at bedside and updated on plan. Please call Oncology for questions.    LOS: 2 days   Mikey Bussing, DNP, AGPCNP-BC, AOCNP 08/08/19

## 2019-08-08 NOTE — Progress Notes (Addendum)
ANTICOAGULATION CONSULT NOTE Pharmacy Consult for Heparin Indication: pulmonary embolus Patient Measurements: Height: (S) 6' (182.9 cm) Weight: (S) 201 lb 11.5 oz (91.5 kg) IBW/kg (Calculated) : 77.6 Heparin Dosing Weight = TBW = 91.5kg   Vital Signs: Temp: 97.7 F (36.5 C) (08/17 0536) Temp Source: Oral (08/17 0536) BP: 127/72 (08/17 0536) Pulse Rate: 64 (08/17 0536)  Labs: Recent Labs    08/05/19 1312 08/05/19 2124 08/05/19 2324 08/06/19 0500  08/06/19 1900 08/07/19 0418 08/08/19 0438  HGB 12.7*  --   --  11.6*  --   --  10.1* 9.9*  HCT 39.3  --   --  36.2*  --   --  31.9* 31.1*  PLT 181  --   --  203  --   --  204 247  HEPARINUNFRC  --   --   --   --    < > 0.40 0.53 0.53  CREATININE 1.29*  --   --  1.27*  --   --   --   --   TROPONINIHS  --  27* 31*  --   --   --   --   --    < > = values in this interval not displayed.   Assessment: 85 yo M with malignant mesothelioma presents with shortness of breath.  Chest CT+ PE. Not on anticoagulants PTA.  INR at baseline 06/29/19. CBC- Pltc WNL, Hg slightly low at 12.7 but this appears to be patient's baseline.   08/08/2019  Doppler neg for DVT  Today, 08/08/19   HL = 0.53 remains therapeutic on heparin infusion of 1350 units/hr  Hgb 9.9 - slightly decreased  Plt - WNL, stable  No issues with infusion, no signs/symptoms of bleeding.  Goal of Therapy:  Heparin level 0.3-0.7 units/ml Monitor platelets by anticoagulation protocol: Yes   Plan:   Continue heparin infusion at current rate of 1350 units/hr  Daily HL and CBC  Follow for transition to oral anticoagulation  Dolly Rias RPh 08/08/2019, 7:23 AM  Pharmacy consulted to dose xarelto  Plan:  Stop heparin drip  Within 1 hour start xarelto 15mg  po twice daily for 21 days then  xarelto 20mg  once daily thereafter (starting 08/29/2019)  Stop heparin labs  Pharmacy will provide education  Dolly Rias RPh 08/08/2019, 10:12 AM

## 2019-08-08 NOTE — Progress Notes (Signed)
Pt has decided to try CPAP QHS tonight.  Machine ready at bedside, Pt wants RT to come back around 2300 to help him get it on.  RT to monitor and assess as needed.

## 2019-08-08 NOTE — Evaluation (Signed)
Physical Therapy Evaluation Patient Details Name: Jeffrey [name] MRN: [id number] DOB: 1934-03-01 Today's Date: 08/08/2019   History of Present Illness  85 year old male with history of malignant pleural mesothelioma, CAD, depression, GERD, hypertension, hyperlipidemia, cardiomyopathy, ITP, OSA and admitted for Acute right pulmonary embolism with mild acute respiratory failure with hypoxia  Clinical Impression  Pt admitted with above diagnosis.  Pt currently with functional limitations due to the deficits listed below (see PT Problem List). Pt will benefit from skilled PT to increase their independence and safety with mobility to allow discharge to the venue listed below.  Pt agreeable to mobilize and ambulated around unit without any symptoms.  Pt lives with his spouse and plans to d/c back home.  No follow up PT needs anticipated.     Follow Up Recommendations No PT follow up    Equipment Recommendations  None recommended by PT    Recommendations for Other Services       Precautions / Restrictions Precautions Precautions: None Restrictions Weight Bearing Restrictions: No      Mobility  Bed Mobility Overal bed mobility: Needs Assistance Bed Mobility: Supine to Sit     Supine to sit: Supervision        Transfers Overall transfer level: Needs assistance Equipment used: None Transfers: Sit to/from Stand Sit to Stand: Min guard         General transfer comment: min/guard for safety  Ambulation/Gait Ambulation/Gait assistance: Min guard Gait Distance (Feet): 400 Feet Assistive device: IV Pole;Straight cane Gait Pattern/deviations: Step-through pattern;Decreased stride length     General Gait Details: pt utilized his SPC and IV pole for support (does have RW at home if needed), denies any symptoms, SpO2 98% on room air upon returning to room; HR 86-90 bpm  Stairs            Wheelchair Mobility    Modified Rankin (Stroke Patients Only)       Balance  Overall balance assessment: Needs assistance         Standing balance support: No upper extremity supported Standing balance-Leahy Scale: Fair Standing balance comment: static standing fair, pt utilizes UE support for ambulating                             Pertinent Vitals/Pain Pain Assessment: No/denies pain    Home Living Family/patient expects to be discharged to:: Private residence Living Arrangements: Spouse/significant other Available Help at Discharge: Family;Available 24 hours/day Type of Home: House Home Access: Stairs to enter Entrance Stairs-Rails: Left Entrance Stairs-Number of Steps: 3 Home Layout: Two level;Able to live on main level with bedroom/bathroom Home Equipment: Gilford Rile - 2 wheels;Cane - single point;Bedside commode      Prior Function Level of Independence: Independent with assistive device(s)         Comments: uses SPC     Hand Dominance        Extremity/Trunk Assessment        Lower Extremity Assessment Lower Extremity Assessment: Generalized weakness       Communication   Communication: No difficulties  Cognition Arousal/Alertness: Awake/alert Behavior During Therapy: WFL for tasks assessed/performed Overall Cognitive Status: Within Functional Limits for tasks assessed                                        General Comments      Exercises  Assessment/Plan    PT Assessment Patient needs continued PT services  PT Problem List Decreased strength;Decreased mobility;Decreased activity tolerance;Decreased balance;Decreased knowledge of use of DME       PT Treatment Interventions DME instruction;Gait training;Balance training;Therapeutic exercise;Stair training;Functional mobility training;Therapeutic activities;Patient/family education    PT Goals (Current goals can be found in the Care Plan section)  Acute Rehab PT Goals PT Goal Formulation: With patient Time For Goal Achievement:  08/22/19 Potential to Achieve Goals: Good    Frequency Min 3X/week   Barriers to discharge        Co-evaluation               AM-PAC PT "6 Clicks" Mobility  Outcome Measure Help needed turning from your back to your side while in a flat bed without using bedrails?: None Help needed moving from lying on your back to sitting on the side of a flat bed without using bedrails?: A Little Help needed moving to and from a bed to a chair (including a wheelchair)?: A Little Help needed standing up from a chair using your arms (e.g., wheelchair or bedside chair)?: A Little Help needed to walk in hospital room?: A Little Help needed climbing 3-5 steps with a railing? : A Little 6 Click Score: 19    End of Session   Activity Tolerance: Patient tolerated treatment well Patient left: in chair;with call bell/phone within reach Nurse Communication: Mobility status PT Visit Diagnosis: Difficulty in walking, not elsewhere classified (R26.2)    Time: 1610-9604 PT Time Calculation (min) (ACUTE ONLY): 12 min   Charges:   PT Evaluation $PT Eval Low Complexity: [place], PT, DPT Acute Rehabilitation Services Office: [phone] Pager: [phone]  Trena Platt 08/08/2019, 12:20 PM

## 2019-08-08 NOTE — TOC Progression Note (Signed)
Transition of Care [facility]) - Progression Note    Patient Details  Name: Jeffrey [name] MRN: [id number] Date of Birth: [birth year]  [facility] CM/SW Contact  Purcell Mouton, RN Phone Number: 08/08/2019, 4:12 PM  Clinical Narrative:     Pt's Co-pay for Xarelto or Eliquis is $30.00.       Expected Discharge Plan and Services                                                 Social Determinants of Health (SDOH) Interventions    Readmission Risk Interventions No flowsheet data found.

## 2019-08-08 NOTE — Telephone Encounter (Signed)
R/s appt per 8/17 sch message - pt in hospital .   Pt to get an updated schedule when discharged .

## 2019-08-08 NOTE — TOC Benefit Eligibility Note (Signed)
Transition of Care [facility]) Benefit Eligibility Note    Patient Details  Name: Jeffrey [name] MRN: [id number] Date of Birth: Apr 06, 1934   Medication/Dose: Xarelto 75m and Eliquis 132m Covered?: Yes  Tier: 3 Drug  Prescription Coverage [facility]  Spoke with Person/Company/Phone Number:: Daryl/ [facility] [employer]  Co-Pay: $30.00 for both drugs  Prior Approval: No  Deductible: Met       FuKerin Salenhone Number: 08/08/2019, 9:54 AM

## 2019-08-08 NOTE — Progress Notes (Signed)
Initial Nutrition Assessment  INTERVENTION:   -Ensure Enlive po BID, each supplement provides 350 kcal and 20 grams of protein  NUTRITION DIAGNOSIS:   Increased nutrient needs related to cancer and cancer related treatments as evidenced by estimated needs.  GOAL:   Patient will meet greater than or equal to 90% of their needs  MONITOR:   PO intake, Supplement acceptance, Labs, Weight trends, I & O's  REASON FOR ASSESSMENT:   Malnutrition Screening Tool    ASSESSMENT:   85 year old male with history of malignant pleural mesothelioma, CAD, depression, GERD, hypertension, hyperlipidemia, cardiomyopathy, ITP, OSA presented with fevers, shortness of breath, coughing, fatigue. Admitted with UTI.  **RD working remotely**  Unable to reach patient by phone today. Per chart review, pt has been receiving chemotherapy for mesothelioma PTA. Pt consumed 25% of meals on 8/16 and he consumed 75% of breakfast (oatmeal ,eggs and coffee) and 50% of lunch (meatloaf, broccoli and milk).  Patient would likely benefit from nutritional supplements given increased needs from cancer and cancer treatments. Will order Ensure supplements.  Per weight records, pt weighed 219 lbs on 5/23 (8% wt loss x 3 months, significant for time frame).   Labs reviewed. Medications: Folic acid tablet daily, Remeron tablet daily   NUTRITION - FOCUSED PHYSICAL EXAM:  Unable to perform -working remotely.  Diet Order:   Diet Order            Diet Heart Room service appropriate? Yes; Fluid consistency: Thin  Diet effective now              EDUCATION NEEDS:   No education needs have been identified at this time  Skin:  Skin Assessment: Reviewed RN Assessment  Last BM:  8/17  Height:   Ht Readings from Last 1 Encounters:  08/06/19 (S) 6' (1.829 m)    Weight:   Wt Readings from Last 1 Encounters:  08/06/19 (S) 91.5 kg    Ideal Body Weight:  80.9 kg  BMI:  Body mass index is 27.36  kg/m.  Estimated Nutritional Needs:   Kcal:  2400-2600  Protein:  100-110g  Fluid:  2.4L/day  Clayton Bibles, MS, RD, LDN [place] [facility] Pager: [phone] After Hours Pager: [phone]

## 2019-08-08 NOTE — Progress Notes (Signed)
[facility]                                                                              Patient Demographics  Jeffrey [name], is a 85 y.o. male, DOB - 1934-08-04, OTL:[id number]  Admit date - 08/05/2019   Admitting Physician Jeffrey Baker, MD  Outpatient Primary MD for the patient is Jeffrey Rad, MD  Outpatient specialists:   LOS - 2  days   Medical records reviewed and are as summarized below:    Chief Complaint  Patient presents with   Shortness of Breath   Fatigue       Brief summary   Patient is 85 year old male with history of malignant pleural mesothelioma, CAD, depression, GERD, hypertension, hyperlipidemia, cardiomyopathy, ITP, OSA presented with fevers, shortness of breath, coughing, fatigue. COVID-19 test negative.  CT angiogram of the chest showed single pulmonary embolism within a peripheral subsegmental branch to the posterior segment of right lower lobe, additional peripheral PE suspected UA positive for UTI Patient was admitted for further work-up.  Assessment & Plan    Principal Problem: Acute right pulmonary embolism (HCC) with mild acute respiratory failure with hypoxia -No hypoxia, chest pain or shortness of breath.  O2 sats stable, 100% on room air -Doppler ultrasound of the lower extremity showed no DVT -2D echo showed EF of 45%, moderate concentric LVH, impaired relaxation, diffuse hypokinesis, has appointment with Dr. Radford [name] on 08/22/2019 -Case management consulted for NOAC co-pay, will start Xarelto today and DC heparin drip. -Oncology consulted, agrees with Xarelto  Active Problems: Sepsis secondary to Pseudomonas UTI - Patient met Sepsis criteria at the time of admission with hypotension, lowest BP 86/54 leukocytosis,  AKI, creatinine 1.2 source likely due to UTI -Sensitivities reviewed, patient was on IV Rocephin, changed to oral ciprofloxacin per sensitivities  - Blood cultures negative till date    CAD (coronary  artery disease), mildly elevated troponin -Hold aspirin, continue Brilinta -Mildly elevated troponin likely due to acute PE  Hypotension with history of essential hypertension -Stable after IV hydrocortisone 50 mg x 1 on 8/16  -Continue to hold losartan   Mild AKI -Likely due to UTI, creatinine 1.2 -Continue to hold ARB, recheck bmet in a.m.    Hypothyroidism -TSH 1.3 -Continue Synthroid    PD (Parkinson's disease) (HCC) - Continue Sinemet, Xanax  History of  Malignant mesothelioma of pleura (HCC) with chronic right pleural effusion - Last drained on 8/13, usually gets it done twice a week -Appreciate oncology recommendations today  History of rheumatoid arthritis -On chronic prednisone 10 mg daily, hold methotrexate  Code Status: DNR DVT Prophylaxis: Heparin drip Family Communication: Discussed in detail with the patient, all imaging results, lab results explained to the patient's wife.   Disposition Plan: Still feeling weak, not quite at baseline yet.  BP improving.  Starting NOAC, remains inpatient.  DC home in a.m, will work with PT once more.   Time Spent in minutes 25 minutes  Procedures:  CT angiogram of chest  Consultants:   Oncology  Antimicrobials:   Anti-infectives (From admission, onward)   Start     Dose/Rate Route Frequency Ordered Stop  08/08/19 1200  ciprofloxacin (CIPRO) tablet 500 mg     500 mg Oral 2 times daily 08/08/19 0956 08/11/19 0759   08/06/19 2000  cefTRIAXone (ROCEPHIN) 1 g in sodium chloride 0.9 % 100 mL IVPB  Status:  Discontinued     1 g 200 mL/hr over 30 Minutes Intravenous Every 24 hours 08/06/19 0157 08/08/19 0956   08/05/19 1945  cefTRIAXone (ROCEPHIN) 1 g in sodium chloride 0.9 % 100 mL IVPB     1 g 200 mL/hr over 30 Minutes Intravenous  Once 08/05/19 1935 08/05/19 2152         Medications  Scheduled Meds:  carbidopa-levodopa  1 tablet Oral TID   ciprofloxacin  500 mg Oral BID   feeding supplement (ENSURE ENLIVE)   237 mL Oral BID BM   folic acid  1 mg Oral Daily   levothyroxine  200 mcg Oral QAC breakfast   mirtazapine  15 mg Oral QHS   predniSONE  10 mg Oral Q breakfast   Rivaroxaban  15 mg Oral BID WC   [START ON 08/29/2019] rivaroxaban  20 mg Oral Q supper   rOPINIRole  0.5 mg Oral QHS   senna  1 tablet Oral BID   ticagrelor  90 mg Oral BID   Continuous Infusions:  PRN Meds:.acetaminophen **OR** acetaminophen, ALPRAZolam, HYDROcodone-acetaminophen, ipratropium-albuterol, ondansetron **OR** ondansetron (ZOFRAN) IV, polyethylene glycol, sodium chloride flush, traZODone      Subjective:   Jeffrey [name] was seen and examined today.  Feeling somewhat better than admission, still very weak and not quite at baseline yet.  No chest pain or shortness of breath.  Patient denies dizziness, abdominal pain, N/V/D/C, new weakness, numbess, tingling.   Objective:   Vitals:   08/07/19 1258 08/07/19 1945 08/08/19 0536 08/08/19 1341  BP: 99/61 130/84 127/72 135/74  Pulse: 77 83 64 75  Resp: _0 Temp: 98.7 F (37.1 C) 98.2 F (36.8 C) 97.7 F (36.5 C) (!) 97.5 F (36.4 C)  TempSrc: Oral  Oral Oral  SpO2: 100% 99% 98% 100%  Weight:      Height:        Intake/Output Summary (Last 24 hours) at 08/08/2019 1546 Last data filed at 08/08/2019 1300 Gross per 24 hour  Intake 569.01 ml  Output 275 ml  Net 294.01 ml     Wt Readings from Last 3 Encounters:  08/06/19 (S) 91.5 kg  07/19/19 91.5 kg  07/13/19 92.5 kg   Physical Exam  General: Alert and oriented x 3, NAD  Eyes:   HEENT:    Cardiovascular: S1 S2 clear, RRR. No pedal edema b/l  Respiratory: CTAB, no wheezing, rales or rhonchi  Gastrointestinal: Soft, nontender, nondistended, NBS  Ext: no pedal edema bilaterally  Neuro: no new deficits  Musculoskeletal: No cyanosis, clubbing  Skin: No rashes  Psych: Normal affect and demeanor, alert and oriented x3     Data Reviewed:  I have personally reviewed  following labs and imaging studies  Micro Results Recent Results (from the past 240 hour(s))  Culture, blood (Routine X 2) w Reflex to ID Panel     Status: None (Preliminary result)   Collection Time: 08/05/19  4:45 PM   Specimen: BLOOD  Result Value Ref Range Status   Specimen Description   Final    BLOOD PORTA CATH Performed at [facility], [place] [address] [place],  [address]    Special Requests   Final    BOTTLES DRAWN AEROBIC AND  ANAEROBIC Blood Culture adequate volume Performed at [facility] [address] [place], [place] [address]    Culture   Final    NO GROWTH 3 DAYS Performed at [facility], [place] [address], [place], [place] [address]    Report Status PENDING  Incomplete  Culture, blood (Routine X 2) w Reflex to ID Panel     Status: None (Preliminary result)   Collection Time: 08/05/19  4:45 PM   Specimen: BLOOD  Result Value Ref Range Status   Specimen Description   Final    BLOOD RIGHT ANTECUBITAL Performed at [place] [address] [place], [place] [address]    Special Requests   Final    BOTTLES DRAWN AEROBIC ONLY Blood Culture results may not be optimal due to an inadequate volume of blood received in culture bottles Performed at [place] [address] [place], [place] [address]    Culture   Final    NO GROWTH 3 DAYS Performed at [facility], [place] [address], [place], [place] [address]    Report Status PENDING  Incomplete  SARS Coronavirus 2 [facility] order, Performed in [facility] hospital lab) Nasopharyngeal Nasopharyngeal Swab     Status: None   Collection Time: 08/05/19  4:45 PM   Specimen: Nasopharyngeal Swab  Result Value Ref Range Status   SARS Coronavirus 2 NEGATIVE NEGATIVE Final    Comment: (NOTE) If result is NEGATIVE SARS-CoV-2 target nucleic acids are NOT DETECTED. The SARS-CoV-2 RNA is generally detectable in upper and lower   respiratory specimens during the acute phase of infection. The lowest  concentration of SARS-CoV-2 viral copies this assay can detect is 250  copies / mL. A negative result does not preclude SARS-CoV-2 infection  and should not be used as the sole basis for treatment or other  patient management decisions.  A negative result may occur with  improper specimen collection / handling, submission of specimen other  than nasopharyngeal swab, presence of viral mutation(s) within the  areas targeted by this assay, and inadequate number of viral copies  (<250 copies / mL). A negative result must be combined with clinical  observations, patient history, and epidemiological information. If result is POSITIVE SARS-CoV-2 target nucleic acids are DETECTED. The SARS-CoV-2 RNA is generally detectable in upper and lower  respiratory specimens dur ing the acute phase of infection.  Positive  results are indicative of active infection with SARS-CoV-2.  Clinical  correlation with patient history and other diagnostic information is  necessary to determine patient infection status.  Positive results do  not rule out bacterial infection or co-infection with other viruses. If result is PRESUMPTIVE POSTIVE SARS-CoV-2 nucleic acids MAY BE PRESENT.   A presumptive positive result was obtained on the submitted specimen  and confirmed on repeat testing.  While 2019 novel coronavirus  (SARS-CoV-2) nucleic acids may be present in the submitted sample  additional confirmatory testing may be necessary for epidemiological  and / or clinical management purposes  to differentiate between  SARS-CoV-2 and other Sarbecovirus currently known to infect humans.  If clinically indicated additional testing with an alternate test  methodology [phone]) is advised. The SARS-CoV-2 RNA is generally  detectable in upper and lower respiratory sp ecimens during the acute  phase of infection. The expected result is Negative. Fact  Sheet for Patients:  StrictlyIdeas.no Fact Sheet for Healthcare Providers: BankingDealers.co.za This test is not yet approved or cleared by the [place]  FDA and has been authorized for detection and/or diagnosis of SARS-CoV-2 by FDA under an Emergency Use Authorization (EUA).  This EUA will remain in effect (meaning this test can be used) for the duration of the COVID-19 declaration under Section 564(b)(1) of the Act, 21 U.S.C. section 360bbb-3(b)(1), unless the authorization is terminated or revoked sooner. Performed at [facility], [place] [address], [place], [place] [address]   Urine culture     Status: Abnormal   Collection Time: 08/05/19  7:36 PM   Specimen: Urine, Random  Result Value Ref Range Status   Specimen Description   Final    URINE, RANDOM Performed at [facility] [address], [place], [place] [address]    Special Requests   Final    NONE Performed at [facility], [place] [address], [place], [place] [address]    Culture >=100,000 COLONIES/mL PSEUDOMONAS AERUGINOSA (A)  Final   Report Status 08/08/2019 FINAL  Final   Organism ID, Bacteria PSEUDOMONAS AERUGINOSA (A)  Final      Susceptibility   Pseudomonas aeruginosa - MIC*    CEFTAZIDIME 4 SENSITIVE Sensitive     CIPROFLOXACIN 1 SENSITIVE Sensitive     GENTAMICIN <=1 SENSITIVE Sensitive     IMIPENEM >=16 RESISTANT Resistant     PIP/TAZO <=4 SENSITIVE Sensitive     CEFEPIME <=1 SENSITIVE Sensitive     * >=100,000 COLONIES/mL PSEUDOMONAS AERUGINOSA    Radiology Reports Dg Chest 2 View  Result Date: 08/05/2019 CLINICAL DATA:  Shortness of breath and fatigue. EXAM: CHEST - 2 VIEW COMPARISON:  July 13, 2019 FINDINGS: The right Port-A-Cath is stable. A right-sided pleural effusion and underlying opacity is stable. No other interval changes. The cardiomediastinal silhouette is stable. IMPRESSION: The  right-sided pleural effusion with underlying opacity is stable. No acute interval changes. Electronically Signed   By: Dorise Bullion III M.D   On: 08/05/2019 14:07   Dg Chest 2 View  Result Date: 07/13/2019 CLINICAL DATA:  Right pleural effusion EXAM: CHEST - 2 VIEW COMPARISON:  06/13/2019 FINDINGS: Right PleurX catheter in place. There is small to moderate right pleural effusion, stable since prior study. Right basilar opacity is stable. Left lung clear. Heart is normal size. Mild tortuosity of the thoracic aorta with calcifications. Right Port-A-Cath remains in place, unchanged. IMPRESSION: Stable right pleural effusion and right basilar opacity, likely atelectasis. Right PleurX catheter in place. No pneumothorax. Electronically Signed   By: Rolm Baptise M.D.   On: 07/13/2019 12:53   Ct Angio Chest Pe W And/or Wo Contrast  Result Date: 08/05/2019 CLINICAL DATA:  Shortness of breath and fatigue since last night. PE suspected, high pretest probability. EXAM: CT ANGIOGRAPHY CHEST WITH CONTRAST TECHNIQUE: Multidetector CT imaging of the chest was performed using the standard protocol during bolus administration of intravenous contrast. Multiplanar CT image reconstructions and MIPs were obtained to evaluate the vascular anatomy. CONTRAST:  167m OMNIPAQUE IOHEXOL 350 MG/ML SOLN COMPARISON:  Chest CT angiogram dated 05/23/2019. FINDINGS: Cardiovascular: Majority of the segmental and subsegmental pulmonary arteries cannot be definitively characterized due to patient breathing motion artifact, however, there is at least a single pulmonary embolism within a peripheral subsegmental branch to the posterior segment of the RIGHT lower lobe. Additional peripheral pulmonary emboli suspected. No central obstructing pulmonary embolism identified within the main or intralobar pulmonary arteries bilaterally. No thoracic aortic aneurysm or evidence of aortic dissection. Diffuse aortic atherosclerosis. Cardiomegaly. No  pericardial effusion. Diffuse coronary artery calcifications. Status post median  sternotomy for CABG. Mediastinum/Nodes: No mass or enlarged lymph nodes are seen within the mediastinum or perihilar regions. Esophagus is unremarkable.z trachea appears normal. Lungs/Pleura: Chronic atelectasis and pleural thickening at the RIGHT lung base. No evidence of pneumonia or pulmonary edema. No pleural effusion or pneumothorax seen. Upper Abdomen: No acute findings. Musculoskeletal: No acute or suspicious osseous findings. Degenerative spondylosis of the thoracic spine, mild to moderate in degree. Review of the MIP images confirms the above findings. IMPRESSION: 1. At least a single pulmonary embolism within a peripheral subsegmental branch to the posterior segment of the RIGHT lower lobe. Additional peripheral pulmonary emboli suspected, however, majority of the segmental and subsegmental pulmonary arteries cannot be definitively characterized due to patient breathing motion artifact. No central obstructing pulmonary embolism is identified within the main or intralobar pulmonary arteries bilaterally. 2. Cardiomegaly. No pericardial effusion. 3. Chronic atelectasis/pleural thickening at the RIGHT lung base. No evidence of pneumonia or pulmonary edema. No pleural effusion. Aortic Atherosclerosis (ICD10-I70.0). Electronically Signed   By: Franki Cabot M.D.   On: 08/05/2019 19:37   Vas [place] Lower Extremity Venous (dvt)  Result Date: 08/07/2019  Lower Venous Study Indications: Pulmonary embolism.  Comparison Study: 05/19/19 negative Performing Technologist: June Leap RDMS, RVT  Examination Guidelines: A complete evaluation includes B-mode imaging, spectral Doppler, color Doppler, and power Doppler as needed of all accessible portions of each vessel. Bilateral testing is considered an integral part of a complete examination. Limited examinations for reoccurring indications may be performed as noted.   +---------+---------------+---------+-----------+----------+-------+  RIGHT     Compressibility Phasicity Spontaneity Properties Summary  +---------+---------------+---------+-----------+----------+-------+  CFV       Full            Yes       Yes                             +---------+---------------+---------+-----------+----------+-------+  SFJ       Full                                                      +---------+---------------+---------+-----------+----------+-------+  FV Prox   Full                                                      +---------+---------------+---------+-----------+----------+-------+  FV Mid    Full                                                      +---------+---------------+---------+-----------+----------+-------+  FV Distal Full                                                      +---------+---------------+---------+-----------+----------+-------+  PFV       Full                                                      +---------+---------------+---------+-----------+----------+-------+  POP       Full            Yes       Yes                             +---------+---------------+---------+-----------+----------+-------+  PTV       Full                                                      +---------+---------------+---------+-----------+----------+-------+  PERO      Full                                                      +---------+---------------+---------+-----------+----------+-------+   +---------+---------------+---------+-----------+----------+-------+  LEFT      Compressibility Phasicity Spontaneity Properties Summary  +---------+---------------+---------+-----------+----------+-------+  CFV       Full            Yes       Yes                             +---------+---------------+---------+-----------+----------+-------+  SFJ       Full                                                      +---------+---------------+---------+-----------+----------+-------+  FV Prox   Full                                                       +---------+---------------+---------+-----------+----------+-------+  FV Mid    Full                                                      +---------+---------------+---------+-----------+----------+-------+  FV Distal Full                                                      +---------+---------------+---------+-----------+----------+-------+  PFV       Full                                                      +---------+---------------+---------+-----------+----------+-------+  POP       Full            Yes       Yes                             +---------+---------------+---------+-----------+----------+-------+  PTV       Full                                                      +---------+---------------+---------+-----------+----------+-------+  PERO      Full                                                      +---------+---------------+---------+-----------+----------+-------+     Summary: Right: There is no evidence of deep vein thrombosis in the lower extremity. A cystic structure is found in the popliteal fossa. Left: There is no evidence of deep vein thrombosis in the lower extremity. No cystic structure found in the popliteal fossa. Incidental arterial finding: Left CFA atypical plaque formation anterior wall- possibly ulcerative plaque. Left Pop A significant  focal plaque causing >50% stenosis.  *See table(s) above for measurements and observations. Electronically signed by Ruta Hinds MD on 08/07/2019 at 12:13:52 PM.    Final     Lab Data:  CBC: Recent Labs  Lab 08/02/19 1317 08/05/19 1312 08/06/19 0500 08/07/19 0418 08/08/19 0438  WBC 4.7 7.8 12.0* 12.1* 12.4*  NEUTROABS 1.5*  --   --   --   --   HGB 12.1* 12.7* 11.6* 10.1* 9.9*  HCT 37.3* 39.3 36.2* 31.9* 31.1*  MCV 94.7 95.6 95.5 94.9 94.2  PLT 112* 181 203 204 578   Basic Metabolic Panel: Recent Labs  Lab 08/02/19 1317 08/05/19 1312 08/06/19 0500  NA 141 138 137  K  3.7 3.8 3.7  CL 105 102 102  CO2 _0 GLUCOSE 96 127* 107*  BUN _1 CREATININE 1.18 1.29* 1.27*  CALCIUM 9.1 9.0 8.4*  MG  --   --  1.9  PHOS  --   --  3.1   GFR: Estimated Creatinine Clearance: 49.2 mL/min (A) (by C-G formula based on SCr of 1.27 mg/dL (H)). Liver Function Tests: Recent Labs  Lab 08/02/19 1317 08/06/19 0500  AST 17 22  ALT 28 14  ALKPHOS 66 62  BILITOT 0.2* 0.8  PROT 6.7 6.9  ALBUMIN 3.5 3.3*   No results for input(s): LIPASE, AMYLASE in the last 168 hours. No results for input(s): AMMONIA in the last 168 hours. Coagulation Profile: No results for input(s): INR, PROTIME in the last 168 hours. Cardiac Enzymes: No results for input(s): CKTOTAL, CKMB, CKMBINDEX, TROPONINI in the last 168 hours. BNP (last 3 results) No results for input(s): PROBNP in the last 8760 hours. HbA1C: No results for input(s): HGBA1C in the last 72 hours. CBG: No results for input(s): GLUCAP in the last 168 hours. Lipid Profile: No results for input(s): CHOL, HDL, LDLCALC, TRIG, CHOLHDL, LDLDIRECT in the last 72 hours. Thyroid Function Tests: Recent Labs    08/06/19 0500  TSH 1.345   Anemia Panel: No results for input(s): VITAMINB12, FOLATE, FERRITIN, TIBC, IRON, RETICCTPCT in the last 72 hours. Urine analysis:    Component Value Date/Time   COLORURINE YELLOW 08/05/2019 1830   APPEARANCEUR CLEAR 08/05/2019 1830   LABSPEC 1.020 08/05/2019 1830   PHURINE 6.0 08/05/2019 1830   GLUCOSEU NEGATIVE 08/05/2019 [place] NEGATIVE 08/05/2019 1830  BILIRUBINUR NEGATIVE 08/05/2019 [place] 08/05/2019 1830   PROTEINUR 30 (A) 08/05/2019 1830   UROBILINOGEN 0.2 11/22/2014 1022   NITRITE POSITIVE (A) 08/05/2019 1830   LEUKOCYTESUR MODERATE (A) 08/05/2019 1830     Juniper Snyders M.D. [facility] 08/08/2019, 3:46 PM  Pager: [phone] Between 7am to 7pm - call Pager - [phone]  After 7pm go to [email or web address] - password [email or web address]  Call night  coverage person covering after 7pm

## 2019-08-08 NOTE — Discharge Instructions (Signed)

## 2019-08-08 NOTE — Progress Notes (Signed)
Pt has declined use of CPAP QHS while here at this facility.  RT to monitor and assess as needed.

## 2019-08-09 ENCOUNTER — Inpatient Hospital Stay: Payer: Medicare Other

## 2019-08-09 ENCOUNTER — Telehealth: Payer: Self-pay | Admitting: Cardiology

## 2019-08-09 ENCOUNTER — Inpatient Hospital Stay: Payer: Medicare Other | Admitting: Physician Assistant

## 2019-08-09 LAB — CBC
HCT: 31.9 % — ABNORMAL LOW (ref 39.0–52.0)
Hemoglobin: 10.3 g/dL — ABNORMAL LOW (ref 13.0–17.0)
MCH: 30.4 pg (ref 26.0–34.0)
MCHC: 32.3 g/dL (ref 30.0–36.0)
MCV: 94.1 fL (ref 80.0–100.0)
Platelets: 280 10*3/uL (ref 150–400)
RBC: 3.39 MIL/uL — ABNORMAL LOW (ref 4.22–5.81)
RDW: 16.4 % — ABNORMAL HIGH (ref 11.5–15.5)
WBC: 12.4 10*3/uL — ABNORMAL HIGH (ref 4.0–10.5)
nRBC: 0 % (ref 0.0–0.2)

## 2019-08-09 LAB — BASIC METABOLIC PANEL
Anion gap: 8 (ref 5–15)
BUN: 15 mg/dL (ref 8–23)
CO2: 22 mmol/L (ref 22–32)
Calcium: 7.9 mg/dL — ABNORMAL LOW (ref 8.9–10.3)
Chloride: 108 mmol/L (ref 98–111)
Creatinine, Ser: 1.04 mg/dL (ref 0.61–1.24)
GFR calc Af Amer: >60 mL/min (ref >60)
GFR calc non Af Amer: >60 mL/min (ref >60)
Glucose, Bld: 97 mg/dL (ref 70–99)
Potassium: 3.1 mmol/L — ABNORMAL LOW (ref 3.5–5.1)
Sodium: 138 mmol/L (ref 135–145)

## 2019-08-09 MED ORDER — RIVAROXABAN 20 MG PO TABS: 20.0000 mg | ORAL_TABLET | Freq: Every day | ORAL | 3 refills | Status: DC

## 2019-08-09 MED ORDER — POTASSIUM CHLORIDE CRYS ER 20 MEQ PO TBCR
40.0000 meq | EXTENDED_RELEASE_TABLET | Freq: Once | ORAL | Status: AC
  Administered 2019-08-09: 40 meq via ORAL
  Filled 2019-08-09: qty 2

## 2019-08-09 MED ORDER — HEPARIN SOD (PORK) LOCK FLUSH 100 UNIT/ML IV SOLN
500.0000 [IU] | INTRAVENOUS | Status: AC | PRN
  Administered 2019-08-09: 500 [IU]

## 2019-08-09 MED ORDER — RIVAROXABAN (XARELTO) VTE STARTER PACK (15 & 20 MG): ORAL_TABLET | ORAL | 0 refills | Status: DC

## 2019-08-09 MED ORDER — CIPROFLOXACIN HCL 500 MG PO TABS: 500.0000 mg | ORAL_TABLET | Freq: Two times a day (BID) | ORAL | 0 refills | Status: AC

## 2019-08-09 MED ORDER — PROCHLORPERAZINE MALEATE 10 MG PO TABS: 10.0000 mg | ORAL_TABLET | Freq: Four times a day (QID) | ORAL | 0 refills | Status: DC | PRN

## 2019-08-09 NOTE — Discharge Summary (Addendum)
Physician Discharge Summary   Patient ID: Jeffrey [name] MRN: [id number] DOB/AGE: Nov 07, 1934 85 y.o.  Admit date: 08/05/2019 Discharge date: 08/09/2019  Primary Care Physician:  [name], MD   Recommendations for Outpatient Follow-up:  1. Follow up with PCP in 1-2 weeks 2. Patient started on Xarelto 15 mg twice daily for 3 weeks, on day 22 (9/7), start 20 mg daily with supper for acute PE  Home Health: PT evaluation done, no PT follow-up needed, patient close to baseline Equipment/Devices:   Discharge Condition: stable  CODE STATUS: DNR   Diet recommendation: Heart healthy diet   Discharge Diagnoses:     Acute right pulmonary embolism  Mild acute respiratory failure with hypoxia secondary to PE resolved  Sepsis secondary to Pseudomonas UTI . CAD (coronary artery disease) of artery bypass graft . Hypothyroidism . PD (Parkinson's disease) ([place]) . Chronic pleural effusion on right . Coronary artery disease due to lipid rich plaque . History of malignant mesothelioma of pleura ([place]) . Mild acute kidney injury . History of rheumatoid arthritis   Consults: Oncology, Dr. Julien Nordmann    Allergies:   Allergies  Allergen Reactions  . Lipitor [Atorvastatin] Other (See Comments)    muscle spasms cramps  . Methylprednisolone Other (See Comments)    cramps cramps  . Morphine And Related     Hallucinations at times  . Other     If patient is to receive blood - blood must be treated witgh radiation because his body will not accept a normal infusion   . Sulfa Antibiotics Itching  . Doxycycline Rash    Doxycycline caused itching redness on scalp and head Doxycycline caused itching redness on scalp and head     DISCHARGE MEDICATIONS: Allergies as of 08/09/2019      Reactions   Lipitor [atorvastatin] Other (See Comments)   muscle spasms cramps   Methylprednisolone Other (See Comments)   cramps cramps   Morphine And Related    Hallucinations at times   Other    If  patient is to receive blood - blood must be treated witgh radiation because his body will not accept a normal infusion    Sulfa Antibiotics Itching   Doxycycline Rash   Doxycycline caused itching redness on scalp and head Doxycycline caused itching redness on scalp and head      Medication List    STOP taking these medications   aspirin EC 81 MG tablet     TAKE these medications   AeroChamber MV inhaler Use as instructed   albuterol 108 (90 Base) MCG/ACT inhaler Commonly known as: VENTOLIN HFA Inhale 1 puff into the lungs 2 (two) times daily as needed for shortness of breath.   ALPRAZolam 0.25 MG tablet Commonly known as: XANAX Take 1 tablet (0.25 mg total) by mouth 2 (two) times daily as needed for anxiety.   carbidopa-levodopa 25-100 MG tablet Commonly known as: SINEMET IR Take 1 tablet by mouth 3 (three) times daily. Notes to patient: 08/08/2019   chlorproMAZINE 25 MG tablet Commonly known as: THORAZINE Take 1 tablet (25 mg total) by mouth 3 (three) times daily. Notes to patient: 08/08/2019   ciprofloxacin 500 MG tablet Commonly known as: CIPRO Take 1 tablet (500 mg total) by mouth 2 (two) times daily for 5 days.   dexamethasone 4 MG tablet Commonly known as: DECADRON 4 mg p.o. twice daily the day before, day of and day after chemotherapy every 3 weeks Notes to patient: See instructions (before chemo)   Fish Oil  1200 MG Caps Take 1,200 mg by mouth at bedtime. 360 MG OMEGA-3 Notes to patient: 08/09/2019   Fluticasone-Salmeterol 100-50 MCG/DOSE Aepb Commonly known as: ADVAIR Inhale 1 puff into the lungs daily. Notes to patient: 2/95/1884   folic acid 1 MG tablet Commonly known as: FOLVITE Take 1 tablet (1 mg total) by mouth daily. Notes to patient: 08/09/2019   ipratropium-albuterol 0.5-2.5 (3) MG/3ML Soln Commonly known as: DUONEB Take 3 mLs by nebulization every 6 (six) hours as needed. J44.9 What changed: reasons to take this   levothyroxine 200 MCG  tablet Commonly known as: SYNTHROID Take 200 mcg by mouth at bedtime. Notes to patient: 08/09/2019   lidocaine-prilocaine cream Commonly known as: EMLA Apply 1 application topically as needed.   losartan 50 MG tablet Commonly known as: COZAAR Take 50 mg by mouth daily. Notes to patient: 08/09/2019   methotrexate 2.5 MG tablet Commonly known as: RHEUMATREX Take 15 mg by mouth once a week. Caution:Chemotherapy. Protect from light. Notes to patient: Continue once weekly schedule   mirtazapine 15 MG tablet Commonly known as: REMERON Take 15 mg by mouth at bedtime. Notes to patient: 08/08/2019   omeprazole 40 MG capsule Commonly known as: PRILOSEC Take 40 mg by mouth at bedtime. Notes to patient: 08/08/2019   predniSONE 10 MG tablet Commonly known as: DELTASONE Take 10 mg by mouth daily with breakfast. Notes to patient: 08/09/2019   prochlorperazine 10 MG tablet Commonly known as: COMPAZINE Take 1 tablet (10 mg total) by mouth every 6 (six) hours as needed for nausea or vomiting.   Rivaroxaban 15 & 20 MG Tbpk Take as directed on package: Start with one 71m tablet by mouth twice a day with food. On Day 22 (08/29/19), switch to one 24mtablet once a day with food.   rivaroxaban 20 MG Tabs tablet Commonly known as: Xarelto Take 1 tablet (20 mg total) by mouth daily with supper. Take when the starter pack is completed. Start taking on: August 29, 2019   rOPINIRole 0.5 MG tablet Commonly known as: REQUIP Take 0.5 mg by mouth at bedtime. Notes to patient: 08/08/2019   ticagrelor 90 MG Tabs tablet Commonly known as: BRILINTA Take 1 tablet (90 mg total) by mouth 2 (two) times daily. Notes to patient: 08/08/2019   Vitamin D (Ergocalciferol) 1.25 MG (50000 UT) Caps capsule Commonly known as: DRISDOL Take 50,000 Units by mouth every 7 (seven) days. Notes to patient: Continue every 7 day schedule        Brief H and P: For complete details please refer to admission H and P,  but in brief Patient is [age]ear old male with history of malignant pleural mesothelioma, CAD, depression, GERD, hypertension, hyperlipidemia, cardiomyopathy, ITP, OSA presented with fevers, shortness of breath, coughing, fatigue.COVID-19 test negative. CT angiogram of the chest showed single pulmonary embolism within a peripheral subsegmental branch to the posterior segment of right lower lobe, additional peripheral PE suspected UA positive for UTI Patient was admitted for further work-up.  Hospital Course:   Acute right pulmonary embolism (HCC) with mild acute respiratory failure with hypoxia -In ED, patient's O2 sats were 86 to 88% on room air during my exam. -Currently no hypoxia, chest pain or shortness of breath.  O2 sat stable on room air -Doppler ultrasound of the lower extremity showed no DVT -2D echo showed EF of 45%, moderate concentric LVH, impaired relaxation, diffuse hypokinesis, has appointment with Dr. TuRadford Paxn 08/22/2019 -Patient was placed on IV heparin drip.  Case management was consulted  for NOAC's, oncology was consulted and agreed with Xarelto.   -Started on Xarelto on 8/17, patient recommended Xarelto 15 mg twice daily for 21 days, then 20 mg daily with supper.    Sepsis secondary to Pseudomonas UTI - Patient met Sepsis criteria at the time of admission with hypotension, lowest BP 86/54 leukocytosis,  AKI, creatinine 1.2 source likely due to UTI -Sensitivities reviewed, patient was on IV Rocephin, changed to oral ciprofloxacin per sensitivities, continue for 5 more days to complete full course for UTI - Blood cultures negative till date    CAD (coronary artery disease), mildly elevated troponin -Hold aspirin, continue Brilinta -Mildly elevated troponin likely due to acute PE -Patient has follow-up appointment with cardiology on 08/22/2019, will discuss with cardiology if Brilinta can be stopped at this time.   Hypotension with history of essential  hypertension -Stable after IV hydrocortisone 50 mg x 1 on 8/16  (stress dose steroid) as patient is on chronic prednisone -Losartan was placed on hold.  Patient blood pressure is improved and can resume losartan  Mild AKI -Likely due to UTI, creatinine 1.2 -Creatinine improved 1.0, resume losartan    Hypothyroidism -TSH 1.3 -Continue Synthroid    PD (Parkinson's disease) (HCC) - Continue Sinemet, Xanax  History of  Malignant mesothelioma of pleura (HCC) with chronic right pleural effusion - Last drained on 8/13, usually gets it done twice a week -Patient was seen by oncology during hospitalization  History of rheumatoid arthritis -On chronic prednisone 10 mg daily, hold methotrexate  Hypokalemia Replaced   Day of Discharge S: Feels good, wants to go home.  No acute issues overnight.  No fevers or chills.  No hypoxia  BP 135/69 (BP Location: Right Arm)   Pulse 67   Temp 97.9 F (36.6 C) (Oral)   Resp 16   Ht (S) 6' (1.829 m)   Wt (S) 91.5 kg   SpO2 98%   BMI 27.36 kg/m   Physical Exam: General: Alert and awake oriented x3 not in any acute distress. HEENT: anicteric sclera, pupils reactive to light and accommodation CVS: S1-S2 clear no murmur rubs or gallops Chest: clear to auscultation bilaterally, no wheezing rales or rhonchi Abdomen: soft nontender, nondistended, normal bowel sounds Extremities: no cyanosis, clubbing or edema noted bilaterally, Parkinson's tremor Neuro: Cranial nerves II-XII intact, no focal neurological deficits   The results of significant diagnostics from this hospitalization (including imaging, microbiology, ancillary and laboratory) are listed below for reference.      Procedures/Studies:  Dg Chest 2 View  Result Date: 08/05/2019 CLINICAL DATA:  Shortness of breath and fatigue. EXAM: CHEST - 2 VIEW COMPARISON:  July 13, 2019 FINDINGS: The right Port-A-Cath is stable. A right-sided pleural effusion and underlying opacity is stable.  No other interval changes. The cardiomediastinal silhouette is stable. IMPRESSION: The right-sided pleural effusion with underlying opacity is stable. No acute interval changes. Electronically Signed   By: Dorise Bullion III M.D   On: 08/05/2019 14:07   Dg Chest 2 View  Result Date: 07/13/2019 CLINICAL DATA:  Right pleural effusion EXAM: CHEST - 2 VIEW COMPARISON:  06/13/2019 FINDINGS: Right PleurX catheter in place. There is small to moderate right pleural effusion, stable since prior study. Right basilar opacity is stable. Left lung clear. Heart is normal size. Mild tortuosity of the thoracic aorta with calcifications. Right Port-A-Cath remains in place, unchanged. IMPRESSION: Stable right pleural effusion and right basilar opacity, likely atelectasis. Right PleurX catheter in place. No pneumothorax. Electronically Signed   By:  Rolm Baptise M.D.   On: 07/13/2019 12:53   Ct Angio Chest Pe W And/or Wo Contrast  Result Date: 08/05/2019 CLINICAL DATA:  Shortness of breath and fatigue since last night. PE suspected, high pretest probability. EXAM: CT ANGIOGRAPHY CHEST WITH CONTRAST TECHNIQUE: Multidetector CT imaging of the chest was performed using the standard protocol during bolus administration of intravenous contrast. Multiplanar CT image reconstructions and MIPs were obtained to evaluate the vascular anatomy. CONTRAST:  144m OMNIPAQUE IOHEXOL 350 MG/ML SOLN COMPARISON:  Chest CT angiogram dated 05/23/2019. FINDINGS: Cardiovascular: Majority of the segmental and subsegmental pulmonary arteries cannot be definitively characterized due to patient breathing motion artifact, however, there is at least a single pulmonary embolism within a peripheral subsegmental branch to the posterior segment of the RIGHT lower lobe. Additional peripheral pulmonary emboli suspected. No central obstructing pulmonary embolism identified within the main or intralobar pulmonary arteries bilaterally. No thoracic aortic aneurysm  or evidence of aortic dissection. Diffuse aortic atherosclerosis. Cardiomegaly. No pericardial effusion. Diffuse coronary artery calcifications. Status post median sternotomy for CABG. Mediastinum/Nodes: No mass or enlarged lymph nodes are seen within the mediastinum or perihilar regions. Esophagus is unremarkable.z trachea appears normal. Lungs/Pleura: Chronic atelectasis and pleural thickening at the RIGHT lung base. No evidence of pneumonia or pulmonary edema. No pleural effusion or pneumothorax seen. Upper Abdomen: No acute findings. Musculoskeletal: No acute or suspicious osseous findings. Degenerative spondylosis of the thoracic spine, mild to moderate in degree. Review of the MIP images confirms the above findings. IMPRESSION: 1. At least a single pulmonary embolism within a peripheral subsegmental branch to the posterior segment of the RIGHT lower lobe. Additional peripheral pulmonary emboli suspected, however, majority of the segmental and subsegmental pulmonary arteries cannot be definitively characterized due to patient breathing motion artifact. No central obstructing pulmonary embolism is identified within the main or intralobar pulmonary arteries bilaterally. 2. Cardiomegaly. No pericardial effusion. 3. Chronic atelectasis/pleural thickening at the RIGHT lung base. No evidence of pneumonia or pulmonary edema. No pleural effusion. Aortic Atherosclerosis (ICD10-I70.0). Electronically Signed   By: SFranki CabotM.D.   On: 08/05/2019 19:37   Vas UKoreaLower Extremity Venous (dvt)  Result Date: 08/07/2019  Lower Venous Study Indications: Pulmonary embolism.  Comparison Study: 05/19/19 negative Performing Technologist: JJune LeapRDMS, RVT  Examination Guidelines: A complete evaluation includes B-mode imaging, spectral Doppler, color Doppler, and power Doppler as needed of all accessible portions of each vessel. Bilateral testing is considered an integral part of a complete examination. Limited examinations  for reoccurring indications may be performed as noted.  +---------+---------------+---------+-----------+----------+-------+ RIGHT    CompressibilityPhasicitySpontaneityPropertiesSummary +---------+---------------+---------+-----------+----------+-------+ CFV      Full           Yes      Yes                          +---------+---------------+---------+-----------+----------+-------+ SFJ      Full                                                 +---------+---------------+---------+-----------+----------+-------+ FV Prox  Full                                                 +---------+---------------+---------+-----------+----------+-------+  FV Mid   Full                                                 +---------+---------------+---------+-----------+----------+-------+ FV DistalFull                                                 +---------+---------------+---------+-----------+----------+-------+ PFV      Full                                                 +---------+---------------+---------+-----------+----------+-------+ POP      Full           Yes      Yes                          +---------+---------------+---------+-----------+----------+-------+ PTV      Full                                                 +---------+---------------+---------+-----------+----------+-------+ PERO     Full                                                 +---------+---------------+---------+-----------+----------+-------+   +---------+---------------+---------+-----------+----------+-------+ LEFT     CompressibilityPhasicitySpontaneityPropertiesSummary +---------+---------------+---------+-----------+----------+-------+ CFV      Full           Yes      Yes                          +---------+---------------+---------+-----------+----------+-------+ SFJ      Full                                                  +---------+---------------+---------+-----------+----------+-------+ FV Prox  Full                                                 +---------+---------------+---------+-----------+----------+-------+ FV Mid   Full                                                 +---------+---------------+---------+-----------+----------+-------+ FV DistalFull                                                 +---------+---------------+---------+-----------+----------+-------+ PFV  Full                                                 +---------+---------------+---------+-----------+----------+-------+ POP      Full           Yes      Yes                          +---------+---------------+---------+-----------+----------+-------+ PTV      Full                                                 +---------+---------------+---------+-----------+----------+-------+ PERO     Full                                                 +---------+---------------+---------+-----------+----------+-------+     Summary: Right: There is no evidence of deep vein thrombosis in the lower extremity. A cystic structure is found in the popliteal fossa. Left: There is no evidence of deep vein thrombosis in the lower extremity. No cystic structure found in the popliteal fossa. Incidental arterial finding: Left CFA atypical plaque formation anterior wall- possibly ulcerative plaque. Left Pop A significant  focal plaque causing >50% stenosis.  *See table(s) above for measurements and observations. Electronically signed by Ruta Hinds MD on 08/07/2019 at 12:13:52 PM.    Final       LAB RESULTS: Basic Metabolic Panel: Recent Labs  Lab 08/06/19 0500 08/09/19 0437  NA 137 138  K 3.7 3.1*  CL 102 108  CO2 24 22  GLUCOSE 107* 97  BUN 20 15  CREATININE 1.27* 1.04  CALCIUM 8.4* 7.9*  MG 1.9  --   PHOS 3.1  --    Liver Function Tests: Recent Labs  Lab 08/02/19 1317 08/06/19 0500  AST 17 22  ALT  28 14  ALKPHOS 66 62  BILITOT 0.2* 0.8  PROT 6.7 6.9  ALBUMIN 3.5 3.3*   No results for input(s): LIPASE, AMYLASE in the last 168 hours. No results for input(s): AMMONIA in the last 168 hours. CBC: Recent Labs  Lab 08/02/19 1317  08/08/19 0438 08/09/19 0437  WBC 4.7   < > 12.4* 12.4*  NEUTROABS 1.5*  --   --   --   HGB 12.1*   < > 9.9* 10.3*  HCT 37.3*   < > 31.1* 31.9*  MCV 94.7   < > 94.2 94.1  PLT 112*   < > 247 280   < > = values in this interval not displayed.   Cardiac Enzymes: No results for input(s): CKTOTAL, CKMB, CKMBINDEX, TROPONINI in the last 168 hours. BNP: Invalid input(s): POCBNP CBG: No results for input(s): GLUCAP in the last 168 hours.    Disposition and Follow-up: Discharge Instructions    Diet - low sodium heart healthy   Complete by: As directed    Increase activity slowly   Complete by: As directed        DISPOSITION: Home   DISCHARGE FOLLOW-UP Follow-up Information    [name], MD. Schedule an appointment  as soon as possible for a visit in 2 week(s).   Specialties: Internal Medicine, Infectious Diseases Contact information: [facility] [address] [place] [address] [phone]        Sueanne Margarita, MD Follow up on 08/22/2019.   Specialty: Cardiology Why: Please call to confirm appointment and also ask about Brilinta.  Contact information: [address] [place] [address] [phone]            Time coordinating discharge:  27mns   Signed:   REstill CottaM.D. [facility] 08/09/2019, 11:53 AM

## 2019-08-09 NOTE — Telephone Encounter (Signed)
New message    Patient spouse calling stating they were told patient has an appointment already scheduled on 8/31 at 1:20 with Dr Radford Pax. Appointment does not show in [place]. No current in office available before 8/31. Patient discharged today from [facility]. Offered virtual visit with APP; declined. Spouse has questions and concerns about patient taking Brilinta.

## 2019-08-09 NOTE — Telephone Encounter (Signed)
He needs to stay on Brilinta as he just had a PCI 2 months ago and needs to stay on Xarelto due to PE

## 2019-08-09 NOTE — Progress Notes (Signed)
Attempted to call wife and grandson to notify of discharge. Spouse [phone] cell and [phone]-home and grandson [phone]. No answer will re-attempt to call. SRP, RN

## 2019-08-09 NOTE — Telephone Encounter (Signed)
Patient's spouse calling because patient was recently in the hospital for PE and was started on Xarelto. Patient also takes Brilinta and is holding ASA. Per discharge summary patient was suppose to discuss with Dr. Radford Pax at his appointment on 8/31 regarding whether or not to continue taking brilinta but this appointment has been cancelled and they were not notified. Wife states that patient has bruising all over his arms. Denies blood in urine or stool, or nosebleeds. Recent labs in Epic. Virtual appointment offered and wife declined. Will send to Dr. Radford Pax for review and recommendation regarding Brilinta.

## 2019-08-09 NOTE — Care Management Important Message (Signed)
Important Message  Patient Details IM Letter given to [name] RN to present to the Patient Name: Jeffrey [name] MRN: [id number] Date of Birth: [birth year]   Medicare Important Message Given:  Yes     Kerin Salen 08/09/2019, 10:44 AM

## 2019-08-09 NOTE — Progress Notes (Signed)
Pt discharged to home instructions reviewed with pt and spouse. Acknowledged understanding. SRP, RN

## 2019-08-10 LAB — CULTURE, BLOOD (ROUTINE X 2)
Culture: NO GROWTH
Culture: NO GROWTH
Special Requests: ADEQUATE

## 2019-08-10 NOTE — Telephone Encounter (Addendum)
Pt wife advised. Pt needs 07/2019 OV per Richardson Dopp PA televisit note in 05/2019... pt live in [place] and wife asking if pt can be seen at an office closer to them so I set him up with an appt in [place] 09/02/19 at 2:30pm.

## 2019-08-15 ENCOUNTER — Other Ambulatory Visit: Payer: Self-pay | Admitting: Cardiothoracic Surgery

## 2019-08-15 DIAGNOSIS — J90 Pleural effusion, not elsewhere classified: Secondary | ICD-10-CM

## 2019-08-16 ENCOUNTER — Inpatient Hospital Stay: Payer: Medicare Other

## 2019-08-16 ENCOUNTER — Other Ambulatory Visit: Payer: Self-pay

## 2019-08-16 ENCOUNTER — Telehealth: Payer: Self-pay | Admitting: Internal Medicine

## 2019-08-16 ENCOUNTER — Encounter: Payer: Self-pay | Admitting: Physician Assistant

## 2019-08-16 ENCOUNTER — Inpatient Hospital Stay (HOSPITAL_BASED_OUTPATIENT_CLINIC_OR_DEPARTMENT_OTHER): Payer: Medicare Other | Admitting: Physician Assistant

## 2019-08-16 VITALS — BP 110/63 | HR 65 | Temp 98.4°F | Resp 17 | Ht 72.0 in | Wt 197.9 lb

## 2019-08-16 DIAGNOSIS — N390 Urinary tract infection, site not specified: Secondary | ICD-10-CM | POA: Diagnosis not present

## 2019-08-16 DIAGNOSIS — Z5111 Encounter for antineoplastic chemotherapy: Secondary | ICD-10-CM | POA: Diagnosis not present

## 2019-08-16 DIAGNOSIS — Z7901 Long term (current) use of anticoagulants: Secondary | ICD-10-CM | POA: Diagnosis not present

## 2019-08-16 DIAGNOSIS — C450 Mesothelioma of pleura: Secondary | ICD-10-CM

## 2019-08-16 DIAGNOSIS — I2699 Other pulmonary embolism without acute cor pulmonale: Secondary | ICD-10-CM | POA: Diagnosis not present

## 2019-08-16 DIAGNOSIS — I2510 Atherosclerotic heart disease of native coronary artery without angina pectoris: Secondary | ICD-10-CM | POA: Diagnosis not present

## 2019-08-16 DIAGNOSIS — G4730 Sleep apnea, unspecified: Secondary | ICD-10-CM | POA: Diagnosis not present

## 2019-08-16 DIAGNOSIS — I255 Ischemic cardiomyopathy: Secondary | ICD-10-CM | POA: Diagnosis not present

## 2019-08-16 DIAGNOSIS — R944 Abnormal results of kidney function studies: Secondary | ICD-10-CM | POA: Diagnosis not present

## 2019-08-16 DIAGNOSIS — F418 Other specified anxiety disorders: Secondary | ICD-10-CM | POA: Diagnosis not present

## 2019-08-16 DIAGNOSIS — D72829 Elevated white blood cell count, unspecified: Secondary | ICD-10-CM | POA: Diagnosis not present

## 2019-08-16 DIAGNOSIS — I10 Essential (primary) hypertension: Secondary | ICD-10-CM | POA: Diagnosis not present

## 2019-08-16 DIAGNOSIS — Z79899 Other long term (current) drug therapy: Secondary | ICD-10-CM | POA: Diagnosis not present

## 2019-08-16 DIAGNOSIS — E7800 Pure hypercholesterolemia, unspecified: Secondary | ICD-10-CM | POA: Diagnosis not present

## 2019-08-16 DIAGNOSIS — E039 Hypothyroidism, unspecified: Secondary | ICD-10-CM | POA: Diagnosis not present

## 2019-08-16 DIAGNOSIS — M069 Rheumatoid arthritis, unspecified: Secondary | ICD-10-CM | POA: Diagnosis not present

## 2019-08-16 DIAGNOSIS — K219 Gastro-esophageal reflux disease without esophagitis: Secondary | ICD-10-CM | POA: Diagnosis not present

## 2019-08-16 LAB — CBC WITH DIFFERENTIAL (CANCER CENTER ONLY)
Abs Immature Granulocytes: 0.22 10*3/uL — ABNORMAL HIGH (ref 0.00–0.07)
Basophils Absolute: 0 10*3/uL (ref 0.0–0.1)
Basophils Relative: 0 %
Eosinophils Absolute: 0 10*3/uL (ref 0.0–0.5)
Eosinophils Relative: 0 %
HCT: 31.4 % — ABNORMAL LOW (ref 39.0–52.0)
Hemoglobin: 10.1 g/dL — ABNORMAL LOW (ref 13.0–17.0)
Immature Granulocytes: 1 %
Lymphocytes Relative: 4 %
Lymphs Abs: 0.9 10*3/uL (ref 0.7–4.0)
MCH: 30.1 pg (ref 26.0–34.0)
MCHC: 32.2 g/dL (ref 30.0–36.0)
MCV: 93.7 fL (ref 80.0–100.0)
Monocytes Absolute: 1 10*3/uL (ref 0.1–1.0)
Monocytes Relative: 4 %
Neutro Abs: 20.9 10*3/uL — ABNORMAL HIGH (ref 1.7–7.7)
Neutrophils Relative %: 91 %
Platelet Count: 246 10*3/uL (ref 150–400)
RBC: 3.35 MIL/uL — ABNORMAL LOW (ref 4.22–5.81)
RDW: 16.7 % — ABNORMAL HIGH (ref 11.5–15.5)
WBC Count: 23 10*3/uL — ABNORMAL HIGH (ref 4.0–10.5)
nRBC: 0 % (ref 0.0–0.2)

## 2019-08-16 LAB — CMP (CANCER CENTER ONLY)
ALT: 54 U/L — ABNORMAL HIGH (ref 0–44)
AST: 65 U/L — ABNORMAL HIGH (ref 15–41)
Albumin: 2.4 g/dL — ABNORMAL LOW (ref 3.5–5.0)
Alkaline Phosphatase: 113 U/L (ref 38–126)
Anion gap: 10 (ref 5–15)
BUN: 25 mg/dL — ABNORMAL HIGH (ref 8–23)
CO2: 23 mmol/L (ref 22–32)
Calcium: 9 mg/dL (ref 8.9–10.3)
Chloride: 104 mmol/L (ref 98–111)
Creatinine: 1.42 mg/dL — ABNORMAL HIGH (ref 0.61–1.24)
GFR, Est AFR Am: 53 mL/min — ABNORMAL LOW (ref >60)
GFR, Estimated: 46 mL/min — ABNORMAL LOW (ref >60)
Glucose, Bld: 232 mg/dL — ABNORMAL HIGH (ref 70–99)
Potassium: 4.6 mmol/L (ref 3.5–5.1)
Sodium: 137 mmol/L (ref 135–145)
Total Bilirubin: 0.2 mg/dL — ABNORMAL LOW (ref 0.3–1.2)
Total Protein: 7.4 g/dL (ref 6.5–8.1)

## 2019-08-16 MED ORDER — SODIUM CHLORIDE 0.9 % IV SOLN
Freq: Once | INTRAVENOUS | Status: AC
  Administered 2019-08-16: 16:00:00 via INTRAVENOUS
  Filled 2019-08-16: qty 5

## 2019-08-16 MED ORDER — SODIUM CHLORIDE 0.9% FLUSH
10.0000 mL | INTRAVENOUS | Status: DC | PRN
  Administered 2019-08-16: 10 mL
  Filled 2019-08-16: qty 10

## 2019-08-16 MED ORDER — PALONOSETRON HCL INJECTION 0.25 MG/5ML
INTRAVENOUS | Status: AC
  Filled 2019-08-16: qty 5

## 2019-08-16 MED ORDER — CYANOCOBALAMIN 1000 MCG/ML IJ SOLN
INTRAMUSCULAR | Status: AC
  Filled 2019-08-16: qty 1

## 2019-08-16 MED ORDER — SODIUM CHLORIDE 0.9 % IV SOLN
Freq: Once | INTRAVENOUS | Status: AC
  Administered 2019-08-16: 15:00:00 via INTRAVENOUS
  Filled 2019-08-16: qty 250

## 2019-08-16 MED ORDER — PALONOSETRON HCL INJECTION 0.25 MG/5ML
0.2500 mg | Freq: Once | INTRAVENOUS | Status: AC
  Administered 2019-08-16: 0.25 mg via INTRAVENOUS

## 2019-08-16 MED ORDER — CYANOCOBALAMIN 1000 MCG/ML IJ SOLN
1000.0000 ug | Freq: Once | INTRAMUSCULAR | Status: AC
  Administered 2019-08-16: 1000 ug via INTRAMUSCULAR

## 2019-08-16 MED ORDER — SODIUM CHLORIDE 0.9 % IV SOLN
412.0000 mg/m2 | Freq: Once | INTRAVENOUS | Status: AC
  Administered 2019-08-16: 17:00:00 900 mg via INTRAVENOUS
  Filled 2019-08-16: qty 20

## 2019-08-16 MED ORDER — HEPARIN SOD (PORK) LOCK FLUSH 100 UNIT/ML IV SOLN
500.0000 [IU] | Freq: Once | INTRAVENOUS | Status: AC | PRN
  Administered 2019-08-16: 500 [IU]
  Filled 2019-08-16: qty 5

## 2019-08-16 MED ORDER — SODIUM CHLORIDE 0.9 % IV SOLN
382.5000 mg | Freq: Once | INTRAVENOUS | Status: AC
  Administered 2019-08-16: 380 mg via INTRAVENOUS
  Filled 2019-08-16: qty 38

## 2019-08-16 NOTE — Progress Notes (Signed)
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  PRIOR THERAPY: None  CURRENT THERAPY: Systemic chemotherapy with carboplatin for AUC of 5 and Alimta 500 mg/M2 every 3 weeks.  Status post 2 cycles.  INTERVAL HISTORY: Jeffrey [name] 85 y.o. male returns to the clinic for a follow up visit. The patient was recently hospitalized for a PE and UTI. He is currently on Xeralto. He also has been prescribed ciprofloxacin for a UTI. Pseudomonas was grown on his urine culture. The patient's wife stopped giving him his ciprofloxacin the day after discharge from the hospital. The patient's wife attributed his extreme weakness to the ciprofloxacin. He only received 1-2 doses of his antibiotic. The patient denies any fevers, chills, nightsweats, back pain, hematuria, or dysuria. Since his hospitalization, he is feeling fairly well. He states that he is appetite is slowly improving. He still is endorsing shortness of breath with minimal exertion which is unchanged since his hospitalization. He has a pleurx catheter and is draining around 250 ccs.   Regarding his treatment, he has been tolerating treatment well without any concerning adverse side effects except hiccups and fatigue. He denies any nausea, vomiting, diarrhea, or constipation. He denies any headaches or visual changes. He is here today for evaluation before starting cycle #3.   MEDICAL HISTORY: Past Medical History:  Diagnosis Date  . Anxiety   . Aortic stenosis    mild AS by 07/20/13 echo ([facility])  . Arthritis    "all over"  . Coronary artery disease    NSTEMI 06/2010, CABG x3=> [place]->LAD, SVG->OM1, SVG->PDA, DES LCx 10/2011, DES LAD 12/2011  . Depression   . GERD  (gastroesophageal reflux disease)   . H/O hiatal hernia   . Hearing aid worn    B/L  . High cholesterol   . History of blood transfusion reaction 03/16/2013   "he almost died; he has to get irradiated blood next time"  . Hypertension   . Hypothyroidism   . Ischemic cardiomyopathy   . ITP (idiopathic thrombocytopenic purpura)    Dr. Gaynelle Arabian, on [place]  . Myocardial infarction ([place]) 03/16/10  . Pneumonia 1940's X 1; March 16, 2014 X 1  . Rheumatoid arthritis ([place])   . Shortness of breath    with exertion, has not been very active  . Sleep apnea   . Wears glasses   . Wears partial dentures     ALLERGIES:  is allergic to lipitor [atorvastatin]; methylprednisolone; morphine and related; other; sulfa antibiotics; and doxycycline.  MEDICATIONS:  Current Outpatient Medications  Medication Sig Dispense Refill  . albuterol (VENTOLIN HFA) 108 (90 Base) MCG/ACT inhaler Inhale 1 puff into the lungs 2 (two) times daily as needed for shortness of breath.    . dexamethasone (DECADRON) 4 MG tablet 4 mg p.o. twice daily the day before, day of and day after chemotherapy every 3 weeks 40 tablet 1  . folic acid (FOLVITE) 1 MG tablet Take 1 tablet (1 mg total) by mouth daily. 30 tablet 4  . ipratropium-albuterol (DUONEB) 0.5-2.5 (3) MG/3ML SOLN Take 3 mLs by nebulization every 6 (six) hours as needed. J44.9 (Patient taking differently: Take 3 mLs by nebulization every 6 (six) hours as needed (shortness of breath). J44.9) 360 mL 3  . levothyroxine (SYNTHROID, LEVOTHROID) 200 MCG tablet Take 200 mcg  by mouth at bedtime.     . lidocaine-prilocaine (EMLA) cream Apply 1 application topically as needed. 30 g 0  . losartan (COZAAR) 50 MG tablet Take 50 mg by mouth daily.    . mirtazapine (REMERON) 15 MG tablet Take 15 mg by mouth at bedtime.    [name] Kitchen omeprazole (PRILOSEC) 40 MG capsule Take 40 mg by mouth at bedtime.     . predniSONE (DELTASONE) 10 MG tablet Take 10 mg by mouth daily with breakfast.    . prochlorperazine (COMPAZINE)  10 MG tablet Take 1 tablet (10 mg total) by mouth every 6 (six) hours as needed for nausea or vomiting. 30 tablet 0  . [START ON 08/29/2019] rivaroxaban (XARELTO) 20 MG TABS tablet Take 1 tablet (20 mg total) by mouth daily with supper. Take when the starter pack is completed. 30 tablet 3  . Rivaroxaban 15 & 20 MG TBPK Take as directed on package: Start with one 15mg  tablet by mouth twice a day with food. On Day 22 (08/29/19), switch to one 20mg  tablet once a day with food. 51 each 0  . rOPINIRole (REQUIP) 0.5 MG tablet Take 0.5 mg by mouth at bedtime.    [name] Kitchen Spacer/Aero-Holding Chambers (AEROCHAMBER MV) inhaler Use as instructed 1 each 0  . ticagrelor (BRILINTA) 90 MG TABS tablet Take 1 tablet (90 mg total) by mouth 2 (two) times daily. 60 tablet 2  . ALPRAZolam (XANAX) 0.25 MG tablet Take 1 tablet (0.25 mg total) by mouth 2 (two) times daily as needed for anxiety. (Patient not taking: Reported on 08/16/2019) 20 tablet 0  . carbidopa-levodopa (SINEMET IR) 25-100 MG tablet Take 1 tablet by mouth 3 (three) times daily. (Patient not taking: Reported on 08/16/2019) 270 tablet 1  . chlorproMAZINE (THORAZINE) 25 MG tablet Take 1 tablet (25 mg total) by mouth 3 (three) times daily. (Patient not taking: Reported on 08/16/2019) 30 tablet 0  . Fluticasone-Salmeterol (ADVAIR) 100-50 MCG/DOSE AEPB Inhale 1 puff into the lungs daily.    . methotrexate (RHEUMATREX) 2.5 MG tablet Take 15 mg by mouth once a week. Caution:Chemotherapy. Protect from light.    . Omega-3 Fatty Acids (FISH OIL) 1200 MG CAPS Take 1,200 mg by mouth at bedtime. 360 MG OMEGA-3    . Vitamin D, Ergocalciferol, (DRISDOL) 1.25 MG (50000 UT) CAPS capsule Take 50,000 Units by mouth every 7 (seven) days.     No current facility-administered medications for this visit.    Facility-Administered Medications Ordered in Other Visits  Medication Dose Route Frequency Provider Last Rate Last Dose  . CARBOplatin (PARAPLATIN) 380 mg in sodium chloride 0.9 % 250 mL  chemo infusion  380 mg Intravenous Once Curt Bears, MD      . fosaprepitant (EMEND) 150 mg, dexamethasone (DECADRON) 12 mg in sodium chloride 0.9 % 145 mL IVPB   Intravenous Once Curt Bears, MD 454 mL/hr at 08/16/19 1545    . heparin lock flush 100 unit/mL  500 Units Intracatheter Once PRN Curt Bears, MD      . PEMEtrexed (ALIMTA) 900 mg in sodium chloride 0.9 % 100 mL chemo infusion  412 mg/m2 (Treatment Plan Recorded) Intravenous Once Curt Bears, MD      . sodium chloride flush (NS) 0.9 % injection 10 mL  10 mL Intracatheter PRN Curt Bears, MD        SURGICAL HISTORY:  Past Surgical History:  Procedure Laterality Date  . APPENDECTOMY    . BACK SURGERY    . CATARACT EXTRACTION, BILATERAL    .  CHEST TUBE INSERTION Right 05/20/2019   Procedure: INSERTION PLEURAL DRAINAGE CATHETER;  Surgeon: Ivin Poot, MD;  Location: [facility];  Service: Thoracic;  Laterality: Right;  . COLONOSCOPY    . CORONARY ANGIOPLASTY WITH STENT PLACEMENT     DES Lcx 10/2011, DES LAD 12/2011  . CORONARY ARTERY BYPASS GRAFT  2011   "CABG X3"  . CORONARY STENT INTERVENTION N/A 05/26/2019   Procedure: CORONARY STENT INTERVENTION;  Surgeon: Nelva Bush, MD;  Location: [facility];  Service: Cardiovascular;  Laterality: N/A;  . GASTROC RECESSION EXTREMITY Right 07/06/2015   Pasty Spillers (orthopedics- [place], [place])  . HERNIA REPAIR    . HIATAL HERNIA REPAIR    . IR IMAGING GUIDED PORT INSERTION  06/29/2019  . KNEE ARTHROSCOPY Left 06/22/2014   w/I&D  . KNEE ARTHROSCOPY Left 06/22/2014   Procedure: IRRIGATION AND DEBRIDEMENT WITH CHRONDROPLASTY;  Surgeon: Hessie Dibble, MD;  Location: [facility];  Service: Orthopedics;  Laterality: Left;  . LEFT HEART CATH AND CORS/GRAFTS ANGIOGRAPHY N/A 05/26/2019   Procedure: LEFT HEART CATH AND CORS/GRAFTS ANGIOGRAPHY;  Surgeon: Nelva Bush, MD;  Location: [facility];  Service: Cardiovascular;  Laterality: N/A;  . LUMBAR DISC SURGERY  1960's?   . MULTIPLE TOOTH EXTRACTIONS    . PLEURAL BIOPSY Right 05/20/2019   Procedure: PLEURAL BIOPSY;  Surgeon: Ivin Poot, MD;  Location: [facility];  Service: Thoracic;  Laterality: Right;  . SHOULDER ARTHROSCOPY Right 04/09/2017   Procedure: ARTHROSCOPY SHOULDER;  Surgeon: Melrose Nakayama, MD;  Location: [facility];  Service: Orthopedics;  Laterality: Right;  . STERNAL CLOSURE     "wires from OHS taken out; plate put in" (05/27/2946)  . SYNOVECTOMY Left 08/17/2014   Procedure: SYNOVECTOMY LEFT KNEE;  Surgeon: Hessie Dibble, MD;  Location: [facility];  Service: Orthopedics;  Laterality: Left;  . TOTAL ANKLE ARTHROPLASTY Right 07/06/2015   Pasty Spillers (orthopedics- [facility])  . TOTAL HIP ARTHROPLASTY Right 06/29/2018   Procedure: RIGHT TOTAL HIP ARTHROPLASTY ANTERIOR APPROACH;  Surgeon: Melrose Nakayama, MD;  Location: [facility];  Service: Orthopedics;  Laterality: Right;  . TOTAL HIP ARTHROPLASTY Left 06/09/2016   Scott Streater Claiborne Billings (orthopedics- [place])  . TOTAL KNEE ARTHROPLASTY Left 11/24/2014   Procedure: TOTAL KNEE ARTHROPLASTY;  Surgeon: Hessie Dibble, MD;  Location: [facility];  Service: Orthopedics;  Laterality: Left;  [name] Kitchen VIDEO ASSISTED THORACOSCOPY Right 05/20/2019   Procedure: VIDEO ASSISTED THORACOSCOPY;  Surgeon: Ivin Poot, MD;  Location: [facility];  Service: Thoracic;  Laterality: Right;    REVIEW OF SYSTEMS:   Review of Systems  Constitutional: Positive for fatigue, generalized weakness, and appetite change which is improving. Negative for chills and fever  HENT: Negative for mouth sores, nosebleeds, sore throat and trouble swallowing.   Eyes: Negative for eye problems and icterus.  Respiratory: Positive for shortness of breath and cough. Negative for hemoptysis and wheezing.   Cardiovascular: Negative for chest pain and leg swelling.  Gastrointestinal: Negative for abdominal pain, constipation, diarrhea, nausea and vomiting.  Genitourinary: Negative for bladder incontinence, difficulty  urinating, dysuria, frequency and hematuria.   Musculoskeletal: Negative for back pain, neck pain and neck stiffness.  Skin: Negative for itching and rash.  Neurological: Positive for resting tremor and generalized weakness. Negative for dizziness, extremity weakness, gait problem, headaches, light-headedness and seizures.  Hematological: Negative for adenopathy. Does not bruise/bleed easily.  Psychiatric/Behavioral: Negative for confusion, depression and sleep disturbance. The patient is not nervous/anxious.     PHYSICAL EXAMINATION:  Blood pressure 110/63, pulse 65, temperature 98.4 F (36.9 C), temperature source Oral, resp. rate 17, height 6' (1.829 m), weight 197 lb 14.4 oz (89.8 kg), SpO2 99 %.  ECOG PERFORMANCE STATUS: 2 - Symptomatic, <50% confined to bed  Physical Exam  Constitutional: Oriented to person, place, and time and well-developed, well-nourished, and in no distress.  HENT:  Head: Normocephalic and atraumatic.  Mouth/Throat: Oropharynx is clear and moist. No oropharyngeal exudate.  Eyes: Conjunctivae are normal. Right eye exhibits no discharge. Left eye exhibits no discharge. No scleral icterus.  Neck: Normal range of motion. Neck supple.  Cardiovascular: Systolic murmur noted on exam. Normal rate, regular rhythm and intact distal pulses.   Pulmonary/Chest: Decreased breath sounds on lower right base of the lung. Effort normal. No respiratory distress. No wheezes. No rales.  Abdominal: Soft. Bowel sounds are normal. Exhibits no distension and no mass. There is no tenderness.  Musculoskeletal: Normal range of motion. Exhibits no edema.  Lymphadenopathy:    No cervical adenopathy.  Neurological: Alert and oriented to person, place, and time. Exhibits normal muscle tone. Gait normal. Coordination normal.  Skin: Skin is warm and dry. No rash noted. Not diaphoretic. No erythema. No pallor.  Psychiatric: Mood, memory and judgment normal.  Vitals reviewed.  LABORATORY  DATA: Lab Results  Component Value Date   WBC 23.0 (H) 08/16/2019   HGB 10.1 (L) 08/16/2019   HCT 31.4 (L) 08/16/2019   MCV 93.7 08/16/2019   PLT 246 08/16/2019      Chemistry      Component Value Date/Time   NA 137 08/16/2019 1324   K 4.6 08/16/2019 1324   CL 104 08/16/2019 1324   CO2 23 08/16/2019 1324   BUN 25 (H) 08/16/2019 1324   CREATININE 1.42 (H) 08/16/2019 1324   CREATININE 1.79 (H) 10/12/2014 1136      Component Value Date/Time   CALCIUM 9.0 08/16/2019 1324   ALKPHOS 113 08/16/2019 1324   AST 65 (H) 08/16/2019 1324   ALT 54 (H) 08/16/2019 1324   BILITOT 0.2 (L) 08/16/2019 1324       RADIOGRAPHIC STUDIES:  Dg Chest 2 View  Result Date: 08/05/2019 CLINICAL DATA:  Shortness of breath and fatigue. EXAM: CHEST - 2 VIEW COMPARISON:  July 13, 2019 FINDINGS: The right Port-A-Cath is stable. A right-sided pleural effusion and underlying opacity is stable. No other interval changes. The cardiomediastinal silhouette is stable. IMPRESSION: The right-sided pleural effusion with underlying opacity is stable. No acute interval changes. Electronically Signed   By: Dorise Bullion III M.D   On: 08/05/2019 14:07   Ct Angio Chest Pe W And/or Wo Contrast  Result Date: 08/05/2019 CLINICAL DATA:  Shortness of breath and fatigue since last night. PE suspected, high pretest probability. EXAM: CT ANGIOGRAPHY CHEST WITH CONTRAST TECHNIQUE: Multidetector CT imaging of the chest was performed using the standard protocol during bolus administration of intravenous contrast. Multiplanar CT image reconstructions and MIPs were obtained to evaluate the vascular anatomy. CONTRAST:  163mL OMNIPAQUE IOHEXOL 350 MG/ML SOLN COMPARISON:  Chest CT angiogram dated 05/23/2019. FINDINGS: Cardiovascular: Majority of the segmental and subsegmental pulmonary arteries cannot be definitively characterized due to patient breathing motion artifact, however, there is at least a single pulmonary embolism within a  peripheral subsegmental branch to the posterior segment of the RIGHT lower lobe. Additional peripheral pulmonary emboli suspected. No central obstructing pulmonary embolism identified within the main or intralobar pulmonary arteries bilaterally. No thoracic aortic aneurysm or evidence of aortic dissection. Diffuse aortic  atherosclerosis. Cardiomegaly. No pericardial effusion. Diffuse coronary artery calcifications. Status post median sternotomy for CABG. Mediastinum/Nodes: No mass or enlarged lymph nodes are seen within the mediastinum or perihilar regions. Esophagus is unremarkable.z trachea appears normal. Lungs/Pleura: Chronic atelectasis and pleural thickening at the RIGHT lung base. No evidence of pneumonia or pulmonary edema. No pleural effusion or pneumothorax seen. Upper Abdomen: No acute findings. Musculoskeletal: No acute or suspicious osseous findings. Degenerative spondylosis of the thoracic spine, mild to moderate in degree. Review of the MIP images confirms the above findings. IMPRESSION: 1. At least a single pulmonary embolism within a peripheral subsegmental branch to the posterior segment of the RIGHT lower lobe. Additional peripheral pulmonary emboli suspected, however, majority of the segmental and subsegmental pulmonary arteries cannot be definitively characterized due to patient breathing motion artifact. No central obstructing pulmonary embolism is identified within the main or intralobar pulmonary arteries bilaterally. 2. Cardiomegaly. No pericardial effusion. 3. Chronic atelectasis/pleural thickening at the RIGHT lung base. No evidence of pneumonia or pulmonary edema. No pleural effusion. Aortic Atherosclerosis (ICD10-I70.0). Electronically Signed   By: Franki Cabot M.D.   On: 08/05/2019 19:37   Vas [place] Lower Extremity Venous (dvt)  Result Date: 08/07/2019  Lower Venous Study Indications: Pulmonary embolism.  Comparison Study: 05/19/19 negative Performing Technologist: June Leap RDMS,  RVT  Examination Guidelines: A complete evaluation includes B-mode imaging, spectral Doppler, color Doppler, and power Doppler as needed of all accessible portions of each vessel. Bilateral testing is considered an integral part of a complete examination. Limited examinations for reoccurring indications may be performed as noted.  +---------+---------------+---------+-----------+----------+-------+ RIGHT    CompressibilityPhasicitySpontaneityPropertiesSummary +---------+---------------+---------+-----------+----------+-------+ CFV      Full           Yes      Yes                          +---------+---------------+---------+-----------+----------+-------+ SFJ      Full                                                 +---------+---------------+---------+-----------+----------+-------+ FV Prox  Full                                                 +---------+---------------+---------+-----------+----------+-------+ FV Mid   Full                                                 +---------+---------------+---------+-----------+----------+-------+ FV DistalFull                                                 +---------+---------------+---------+-----------+----------+-------+ PFV      Full                                                 +---------+---------------+---------+-----------+----------+-------+ POP  Full           Yes      Yes                          +---------+---------------+---------+-----------+----------+-------+ PTV      Full                                                 +---------+---------------+---------+-----------+----------+-------+ PERO     Full                                                 +---------+---------------+---------+-----------+----------+-------+   +---------+---------------+---------+-----------+----------+-------+ LEFT     CompressibilityPhasicitySpontaneityPropertiesSummary  +---------+---------------+---------+-----------+----------+-------+ CFV      Full           Yes      Yes                          +---------+---------------+---------+-----------+----------+-------+ SFJ      Full                                                 +---------+---------------+---------+-----------+----------+-------+ FV Prox  Full                                                 +---------+---------------+---------+-----------+----------+-------+ FV Mid   Full                                                 +---------+---------------+---------+-----------+----------+-------+ FV DistalFull                                                 +---------+---------------+---------+-----------+----------+-------+ PFV      Full                                                 +---------+---------------+---------+-----------+----------+-------+ POP      Full           Yes      Yes                          +---------+---------------+---------+-----------+----------+-------+ PTV      Full                                                 +---------+---------------+---------+-----------+----------+-------+ PERO     Full                                                 +---------+---------------+---------+-----------+----------+-------+  Summary: Right: There is no evidence of deep vein thrombosis in the lower extremity. A cystic structure is found in the popliteal fossa. Left: There is no evidence of deep vein thrombosis in the lower extremity. No cystic structure found in the popliteal fossa. Incidental arterial finding: Left CFA atypical plaque formation anterior wall- possibly ulcerative plaque. Left Pop A significant  focal plaque causing >50% stenosis.  *See table(s) above for measurements and observations. Electronically signed by Ruta Hinds MD on 08/07/2019 at 12:13:52 PM.    Final      ASSESSMENT/PLAN:  This is a very pleasant 85 year old  Caucasian male diagnosed with malignant pleural mesothelioma, epitheloid type involving the right hemithorax.  He was diagnosed in May 2020.    The patient is on systemic chemotherapy with carboplatin for an AUC of 5 and Alimta 500 mg/m.  He is status post 2 cycles. The patient had been tolerating treatment well except for hiccups and fatigue.   The patient was recently hospitalized for a UTI and a pulmonary embolism. He is on xarelto. He did not complete his antibiotic as prescribed due to it causing "weakness".   The patient was seen with Dr. Julien Nordmann today.  Labs were reviewed with the patient.  The patient's white blood cell count was elevated today at 23,000.  His creatinine also is elevated today at 1.42.   The patient will receive cycle #3 of Carboplatin and Alimta today as scheduled. However, the patient will receive a reduced dose of Alimta 400 mg/m2 today due to his worsening renal function.   We discussed with the patient and his wife the importance of taking his medications as prescribed and to alert the appropriate healthcare provider if medications are taken differently/discontinued. Given that the patient's WBC is elevated today, we are concerned that the patient's UTI is not treated. He is afebrile and denies any symptoms. His vitals are within normal limits. The other antibiotics from the patient's urine culture and sensitivity only are available in IV formulation.The patient and his wife were instructed to continue taking ciprofloxacin as prescribed. Given his elevated creatinine, they were advised to cut the tablets in half and take 250 mg BID.   Since the patient recently had a CT angiogram performed in the hospital, we will perform the next restaging CT scan after cycle #4.  He will continue on xarelto for his recent pulmonary embolism. He was alerted to seek medical attention if he develops sing and symptoms of bleeding.   The patient has a follow up appointment with his PCP  tomorrow and was encouraged to keep that appointment.   The patient was advised to call immediately if he has any concerning symptoms in the interval. The patient voices understanding of current disease status and treatment options and is in agreement with the current care plan. All questions were answered. The patient knows to call the clinic with any problems, questions or concerns. We can certainly see the patient much sooner if necessary   No orders of the defined types were placed in this encounter.    Jeffrey Voorhies L Damari Suastegui, PA-C 08/16/19  ADDENDUM: Hematology/Oncology Attending: I had a face-to-face encounter with the patient today.  I recommended his care plan.  This is a very pleasant 85 years old white male with malignant pleural mesothelioma.  The patient is currently undergoing treatment with systemic chemotherapy with carboplatin and Alimta status post 2 cycles.  He has been tolerating this treatment well except for fatigue.  He was recently admitted to  the hospital with urinary tract infection and started on treatment with Cipro 500 mg p.o. twice daily on outpatient basis.  He has a rough time with the treatment for the first 1-2 days and discontinued his treatment with Cipro.  Unfortunately all the other sensitive antibiotics are intravenously administered. I recommended for the patient to resume his treatment with Cipro but will reduce the dose to 250 mg p.o. twice daily and if tolerated he will continue for 5-7 days. He will proceed with cycle #3 of his chemotherapy today as planned but with reduced dose of Alimta to 400 mg/M2 because of the renal insufficiency. The patient will come back for follow-up visit in 3 weeks for evaluation before starting cycle #4.  We will consider repeating his CT scan of the chest after cycle #4. He was advised to call immediately if he has any concerning symptoms in the interval.  Disclaimer: This note was dictated with voice recognition  software. Similar sounding words can inadvertently be transcribed and may be missed upon review. Eilleen Kempf, MD 08/16/19

## 2019-08-16 NOTE — Telephone Encounter (Signed)
Added additional cycles per 8/25 los - pt to get an updated schedule next visit.

## 2019-08-16 NOTE — Patient Instructions (Signed)
[facility] Discharge Instructions for Patients Receiving Chemotherapy  Today you received the following chemotherapy agents Alimta and Carboplatin. To help prevent nausea and vomiting after your treatment, we encourage you to take your nausea medication as prescribed. If you develop nausea and vomiting that is not controlled by your nausea medication, call the clinic.   BELOW ARE SYMPTOMS THAT SHOULD BE REPORTED IMMEDIATELY:  *FEVER GREATER THAN 100.5 F  *CHILLS WITH OR WITHOUT FEVER  NAUSEA AND VOMITING THAT IS NOT CONTROLLED WITH YOUR NAUSEA MEDICATION  *UNUSUAL SHORTNESS OF BREATH  *UNUSUAL BRUISING OR BLEEDING  TENDERNESS IN MOUTH AND THROAT WITH OR WITHOUT PRESENCE OF ULCERS  *URINARY PROBLEMS  *BOWEL PROBLEMS  UNUSUAL RASH Items with * indicate a potential emergency and should be followed up as soon as possible.  Feel free to call the clinic should you have any questions or concerns. The clinic phone number is [phone].  Please show the [facility] at check-in to the Emergency Department and triage nurse.

## 2019-08-16 NOTE — Patient Instructions (Signed)
-  We will order a repeat CT scan next time we see you on 09/06/2019 -Please cut the cipro in 1/2 and take it twice a day

## 2019-08-17 ENCOUNTER — Ambulatory Visit (INDEPENDENT_AMBULATORY_CARE_PROVIDER_SITE_OTHER): Payer: Self-pay | Admitting: Cardiothoracic Surgery

## 2019-08-17 ENCOUNTER — Ambulatory Visit
Admission: RE | Admit: 2019-08-17 | Discharge: 2019-08-17 | Disposition: A | Discharge status: Home / Self Care | Payer: Medicare Other | Source: Ambulatory Visit | Attending: Cardiothoracic Surgery | Admitting: Cardiothoracic Surgery

## 2019-08-17 ENCOUNTER — Encounter: Payer: Self-pay | Admitting: Cardiothoracic Surgery

## 2019-08-17 VITALS — BP 116/62 | HR 87 | Temp 96.8°F | Resp 20 | Ht 72.0 in | Wt 201.0 lb

## 2019-08-17 DIAGNOSIS — J90 Pleural effusion, not elsewhere classified: Secondary | ICD-10-CM

## 2019-08-17 NOTE — Progress Notes (Signed)
PCP is [name], MD Referring Provider is Rigoberto Noel, MD  Chief Complaint  Patient presents with  . Pleural Effusion    1 month f/u with CXR, draining pleurX every mon/thurs. amounts 150-200cc's    HPI: 1 month follow-up for right Pleurx catheter Patient had right VATS and pleural biopsy which showed mesothelioma.  Patient under care of Dr. Earlie Server Pleurx drainage approximately 200 cc Mondays and Thursdays. Still has some shortness of breath with activity Chest x-ray today shows Pleurx catheter in good position with some mild loculated effusion at the right base Patient recently hospitalized for PE and is now on rivaroxaban  Today I remove the sutures from the Pleurx insertion and the skin suture around the catheter.  The catheter site is clean and dry and catheter is incorporated  Past Medical History:  Diagnosis Date  . Anxiety   . Aortic stenosis    mild AS by 07/20/13 echo ([facility])  . Arthritis    "all over"  . Coronary artery disease    NSTEMI 06/2010, CABG x3=> [place]->LAD, SVG->OM1, SVG->PDA, DES LCx 10/2011, DES LAD 12/2011  . Depression   . GERD (gastroesophageal reflux disease)   . H/O hiatal hernia   . Hearing aid worn    B/L  . High cholesterol   . History of blood transfusion reaction 03/10/13   "he almost died; he has to get irradiated blood next time"  . Hypertension   . Hypothyroidism   . Ischemic cardiomyopathy   . ITP (idiopathic thrombocytopenic purpura)    Dr. Gaynelle Arabian, on [place]  . Myocardial infarction ([place]) 2010-03-10  . Pneumonia 1940's X 1; 10-Mar-2014 X 1  . Rheumatoid arthritis ([place])   . Shortness of breath    with exertion, has not been very active  . Sleep apnea   . Wears glasses   . Wears partial dentures     Past Surgical History:  Procedure Laterality Date  . APPENDECTOMY    . BACK SURGERY    . CATARACT EXTRACTION, BILATERAL    . CHEST TUBE INSERTION Right 05/20/2019   Procedure: INSERTION PLEURAL DRAINAGE CATHETER;   Surgeon: Ivin Poot, MD;  Location: [facility];  Service: Thoracic;  Laterality: Right;  . COLONOSCOPY    . CORONARY ANGIOPLASTY WITH STENT PLACEMENT     DES Lcx 10/2011, DES LAD 12/2011  . CORONARY ARTERY BYPASS GRAFT  2010-03-10   "CABG X3"  . CORONARY STENT INTERVENTION N/A 05/26/2019   Procedure: CORONARY STENT INTERVENTION;  Surgeon: Nelva Bush, MD;  Location: [facility];  Service: Cardiovascular;  Laterality: N/A;  . GASTROC RECESSION EXTREMITY Right 07/06/2015   Pasty Spillers (orthopedics- [place], [place])  . HERNIA REPAIR    . HIATAL HERNIA REPAIR    . IR IMAGING GUIDED PORT INSERTION  06/29/2019  . KNEE ARTHROSCOPY Left 06/22/2014   w/I&D  . KNEE ARTHROSCOPY Left 06/22/2014   Procedure: IRRIGATION AND DEBRIDEMENT WITH CHRONDROPLASTY;  Surgeon: Hessie Dibble, MD;  Location: [facility];  Service: Orthopedics;  Laterality: Left;  . LEFT HEART CATH AND CORS/GRAFTS ANGIOGRAPHY N/A 05/26/2019   Procedure: LEFT HEART CATH AND CORS/GRAFTS ANGIOGRAPHY;  Surgeon: Nelva Bush, MD;  Location: [facility];  Service: Cardiovascular;  Laterality: N/A;  . LUMBAR DISC SURGERY  1960's?  . MULTIPLE TOOTH EXTRACTIONS    . PLEURAL BIOPSY Right 05/20/2019   Procedure: PLEURAL BIOPSY;  Surgeon: Ivin Poot, MD;  Location: [facility];  Service: Thoracic;  Laterality: Right;  .  SHOULDER ARTHROSCOPY Right 04/09/2017   Procedure: ARTHROSCOPY SHOULDER;  Surgeon: Melrose Nakayama, MD;  Location: [facility];  Service: Orthopedics;  Laterality: Right;  . STERNAL CLOSURE     "wires from OHS taken out; plate put in" (01/26/5808)  . SYNOVECTOMY Left 08/17/2014   Procedure: SYNOVECTOMY LEFT KNEE;  Surgeon: Hessie Dibble, MD;  Location: [facility];  Service: Orthopedics;  Laterality: Left;  . TOTAL ANKLE ARTHROPLASTY Right 07/06/2015   Pasty Spillers (orthopedics- [facility])  . TOTAL HIP ARTHROPLASTY Right 06/29/2018   Procedure: RIGHT TOTAL HIP ARTHROPLASTY ANTERIOR APPROACH;  Surgeon: Melrose Nakayama, MD;  Location: [facility];   Service: Orthopedics;  Laterality: Right;  . TOTAL HIP ARTHROPLASTY Left 06/09/2016   Scott Streater Claiborne Billings (orthopedics- [place])  . TOTAL KNEE ARTHROPLASTY Left 11/24/2014   Procedure: TOTAL KNEE ARTHROPLASTY;  Surgeon: Hessie Dibble, MD;  Location: [facility];  Service: Orthopedics;  Laterality: Left;  [name] Kitchen VIDEO ASSISTED THORACOSCOPY Right 05/20/2019   Procedure: VIDEO ASSISTED THORACOSCOPY;  Surgeon: Ivin Poot, MD;  Location: [facility];  Service: Thoracic;  Laterality: Right;    Family History  Problem Relation Age of Onset  . Heart disease Other   . Arthritis Other   . Heart disease Mother   . Alzheimer's disease Father   . Rheum arthritis Sister   . Rheum arthritis Brother   . Healthy Son     Social History Social History   Tobacco Use  . Smoking status: Former Smoker    Packs/day: 1.00    Years: 15.00    Pack years: 15.00    Types: Cigarettes    Quit date: 12/22/1966    Years since quitting: 52.6  . Smokeless tobacco: Former Systems developer    Types: Chew  . Tobacco comment: "quit smoking ~ late ~ 60's; quit chewing in the 1970's"  Substance Use Topics  . Alcohol use: No  . Drug use: No    Current Outpatient Medications  Medication Sig Dispense Refill  . albuterol (VENTOLIN HFA) 108 (90 Base) MCG/ACT inhaler Inhale 1 puff into the lungs 2 (two) times daily as needed for shortness of breath.    . ALPRAZolam (XANAX) 0.25 MG tablet Take 1 tablet (0.25 mg total) by mouth 2 (two) times daily as needed for anxiety. (Patient not taking: Reported on 08/16/2019) 20 tablet 0  . carbidopa-levodopa (SINEMET IR) 25-100 MG tablet Take 1 tablet by mouth 3 (three) times daily. (Patient not taking: Reported on 08/16/2019) 270 tablet 1  . chlorproMAZINE (THORAZINE) 25 MG tablet Take 1 tablet (25 mg total) by mouth 3 (three) times daily. (Patient not taking: Reported on 08/16/2019) 30 tablet 0  . dexamethasone (DECADRON) 4 MG tablet 4 mg p.o. twice daily the day before, day of and day after  chemotherapy every 3 weeks 40 tablet 1  . Fluticasone-Salmeterol (ADVAIR) 100-50 MCG/DOSE AEPB Inhale 1 puff into the lungs daily.    . folic acid (FOLVITE) 1 MG tablet Take 1 tablet (1 mg total) by mouth daily. 30 tablet 4  . ipratropium-albuterol (DUONEB) 0.5-2.5 (3) MG/3ML SOLN Take 3 mLs by nebulization every 6 (six) hours as needed. J44.9 (Patient taking differently: Take 3 mLs by nebulization every 6 (six) hours as needed (shortness of breath). J44.9) 360 mL 3  . levothyroxine (SYNTHROID, LEVOTHROID) 200 MCG tablet Take 200 mcg by mouth at bedtime.     . lidocaine-prilocaine (EMLA) cream Apply 1 application topically as needed. 30 g 0  . losartan (COZAAR) 50 MG tablet Take 50  mg by mouth daily.    . methotrexate (RHEUMATREX) 2.5 MG tablet Take 15 mg by mouth once a week. Caution:Chemotherapy. Protect from light.    . mirtazapine (REMERON) 15 MG tablet Take 15 mg by mouth at bedtime.    . Omega-3 Fatty Acids (FISH OIL) 1200 MG CAPS Take 1,200 mg by mouth at bedtime. 360 MG OMEGA-3    . omeprazole (PRILOSEC) 40 MG capsule Take 40 mg by mouth at bedtime.     . predniSONE (DELTASONE) 10 MG tablet Take 10 mg by mouth daily with breakfast.    . prochlorperazine (COMPAZINE) 10 MG tablet Take 1 tablet (10 mg total) by mouth every 6 (six) hours as needed for nausea or vomiting. 30 tablet 0  . [START ON 08/29/2019] rivaroxaban (XARELTO) 20 MG TABS tablet Take 1 tablet (20 mg total) by mouth daily with supper. Take when the starter pack is completed. 30 tablet 3  . Rivaroxaban 15 & 20 MG TBPK Take as directed on package: Start with one 15mg  tablet by mouth twice a day with food. On Day 22 (08/29/19), switch to one 20mg  tablet once a day with food. 51 each 0  . rOPINIRole (REQUIP) 0.5 MG tablet Take 0.5 mg by mouth at bedtime.    [name] Kitchen Spacer/Aero-Holding Chambers (AEROCHAMBER MV) inhaler Use as instructed 1 each 0  . ticagrelor (BRILINTA) 90 MG TABS tablet Take 1 tablet (90 mg total) by mouth 2 (two) times  daily. 60 tablet 2  . Vitamin D, Ergocalciferol, (DRISDOL) 1.25 MG (50000 UT) CAPS capsule Take 50,000 Units by mouth every 7 (seven) days.     No current facility-administered medications for this visit.     Allergies  Allergen Reactions  . Lipitor [Atorvastatin] Other (See Comments)    muscle spasms cramps  . Methylprednisolone Other (See Comments)    cramps cramps  . Morphine And Related     Hallucinations at times  . Other     If patient is to receive blood - blood must be treated witgh radiation because his body will not accept a normal infusion   . Sulfa Antibiotics Itching  . Doxycycline Rash    Doxycycline caused itching redness on scalp and head Doxycycline caused itching redness on scalp and head    Review of Systems: Losing hair with chemotherapy Walks with a cane  BP 116/62   Pulse 87   Temp (!) 96.8 F (36 C) (Skin)   Resp 20   Ht 6' (1.829 m)   Wt 201 lb (91.2 kg)   SpO2 95% Comment: RA  BMI 27.26 kg/m  Physical Exam:      Exam    General- alert and comfortable    Neck- no JVD, no cervical adenopathy palpable, no carotid bruit   Lungs- clear without rales, wheezes   Cor- regular rate and rhythm, no murmur , gallop   Abdomen- soft, non-tender   Extremities - warm, non-tender, minimal edema   Neuro- oriented, appropriate, no focal weakness   Diagnostic Tests: Chest x-ray today personally viewed showing catheter in good position with minimal right pleural effusion  Impression: Right Pleurx catheter to manage recurrent effusion from mesothelioma Continue drainage Mondays and Thursdays. When drainage is less than 200 cc per session on several occasions reduced to drainage once a week Plan: Return with chest x-ray in 1 month for review of progress   Len Childs, MD [facility] [phone]

## 2019-08-19 ENCOUNTER — Other Ambulatory Visit: Payer: Self-pay

## 2019-08-19 ENCOUNTER — Emergency Department (HOSPITAL_COMMUNITY): Payer: Medicare Other

## 2019-08-19 ENCOUNTER — Encounter (HOSPITAL_COMMUNITY): Payer: Self-pay | Admitting: *Deleted

## 2019-08-19 ENCOUNTER — Emergency Department (HOSPITAL_COMMUNITY)
Admission: EM | Admit: 2019-08-19 | Discharge: 2019-08-19 | Disposition: A | Discharge status: Home / Self Care | Payer: Medicare Other | Attending: Emergency Medicine | Admitting: Emergency Medicine

## 2019-08-19 DIAGNOSIS — M069 Rheumatoid arthritis, unspecified: Secondary | ICD-10-CM | POA: Diagnosis not present

## 2019-08-19 DIAGNOSIS — Z79899 Other long term (current) drug therapy: Secondary | ICD-10-CM | POA: Diagnosis not present

## 2019-08-19 DIAGNOSIS — Z96652 Presence of left artificial knee joint: Secondary | ICD-10-CM | POA: Insufficient documentation

## 2019-08-19 DIAGNOSIS — Z96643 Presence of artificial hip joint, bilateral: Secondary | ICD-10-CM | POA: Diagnosis not present

## 2019-08-19 DIAGNOSIS — R0602 Shortness of breath: Secondary | ICD-10-CM | POA: Diagnosis present

## 2019-08-19 DIAGNOSIS — I252 Old myocardial infarction: Secondary | ICD-10-CM | POA: Diagnosis not present

## 2019-08-19 DIAGNOSIS — Z951 Presence of aortocoronary bypass graft: Secondary | ICD-10-CM | POA: Insufficient documentation

## 2019-08-19 DIAGNOSIS — I2510 Atherosclerotic heart disease of native coronary artery without angina pectoris: Secondary | ICD-10-CM | POA: Diagnosis not present

## 2019-08-19 DIAGNOSIS — C450 Mesothelioma of pleura: Secondary | ICD-10-CM | POA: Insufficient documentation

## 2019-08-19 DIAGNOSIS — Z87891 Personal history of nicotine dependence: Secondary | ICD-10-CM | POA: Insufficient documentation

## 2019-08-19 DIAGNOSIS — E039 Hypothyroidism, unspecified: Secondary | ICD-10-CM | POA: Diagnosis not present

## 2019-08-19 DIAGNOSIS — R059 Cough, unspecified: Secondary | ICD-10-CM

## 2019-08-19 DIAGNOSIS — R05 Cough: Secondary | ICD-10-CM | POA: Diagnosis not present

## 2019-08-19 DIAGNOSIS — Z7901 Long term (current) use of anticoagulants: Secondary | ICD-10-CM | POA: Diagnosis not present

## 2019-08-19 LAB — CBC WITH DIFFERENTIAL/PLATELET
Abs Immature Granulocytes: 0.04 10*3/uL (ref 0.00–0.07)
Basophils Absolute: 0 10*3/uL (ref 0.0–0.1)
Basophils Relative: 0 %
Eosinophils Absolute: 0 10*3/uL (ref 0.0–0.5)
Eosinophils Relative: 0 %
HCT: 27.1 % — ABNORMAL LOW (ref 39.0–52.0)
Hemoglobin: 8.5 g/dL — ABNORMAL LOW (ref 13.0–17.0)
Immature Granulocytes: 0 %
Lymphocytes Relative: 6 %
Lymphs Abs: 0.6 10*3/uL — ABNORMAL LOW (ref 0.7–4.0)
MCH: 30.4 pg (ref 26.0–34.0)
MCHC: 31.4 g/dL (ref 30.0–36.0)
MCV: 96.8 fL (ref 80.0–100.0)
Monocytes Absolute: 0.1 10*3/uL (ref 0.1–1.0)
Monocytes Relative: 1 %
Neutro Abs: 9.3 10*3/uL — ABNORMAL HIGH (ref 1.7–7.7)
Neutrophils Relative %: 93 %
Platelets: 247 10*3/uL (ref 150–400)
RBC: 2.8 MIL/uL — ABNORMAL LOW (ref 4.22–5.81)
RDW: 17 % — ABNORMAL HIGH (ref 11.5–15.5)
WBC: 10 10*3/uL (ref 4.0–10.5)
nRBC: 0 % (ref 0.0–0.2)

## 2019-08-19 LAB — COMPREHENSIVE METABOLIC PANEL
ALT: 104 U/L — ABNORMAL HIGH (ref 0–44)
AST: 79 U/L — ABNORMAL HIGH (ref 15–41)
Albumin: 2.5 g/dL — ABNORMAL LOW (ref 3.5–5.0)
Alkaline Phosphatase: 107 U/L (ref 38–126)
Anion gap: 8 (ref 5–15)
BUN: 34 mg/dL — ABNORMAL HIGH (ref 8–23)
CO2: 24 mmol/L (ref 22–32)
Calcium: 8.5 mg/dL — ABNORMAL LOW (ref 8.9–10.3)
Chloride: 104 mmol/L (ref 98–111)
Creatinine, Ser: 1.09 mg/dL (ref 0.61–1.24)
GFR calc Af Amer: >60 mL/min (ref >60)
GFR calc non Af Amer: >60 mL/min (ref >60)
Glucose, Bld: 158 mg/dL — ABNORMAL HIGH (ref 70–99)
Potassium: 4.9 mmol/L (ref 3.5–5.1)
Sodium: 136 mmol/L (ref 135–145)
Total Bilirubin: 0.4 mg/dL (ref 0.3–1.2)
Total Protein: 6.7 g/dL (ref 6.5–8.1)

## 2019-08-19 LAB — BRAIN NATRIURETIC PEPTIDE: B Natriuretic Peptide: 646 pg/mL — ABNORMAL HIGH (ref 0.0–100.0)

## 2019-08-19 LAB — TROPONIN I (HIGH SENSITIVITY)
Troponin I (High Sensitivity): 12 ng/L (ref <18)
Troponin I (High Sensitivity): 13 ng/L (ref <18)

## 2019-08-19 MED ORDER — AZITHROMYCIN 250 MG PO TABS: 500.0000 mg | ORAL_TABLET | Freq: Once | ORAL | Status: DC

## 2019-08-19 MED ORDER — AZITHROMYCIN 250 MG PO TABS: ORAL_TABLET | ORAL | 0 refills | Status: DC

## 2019-08-19 MED ORDER — AZITHROMYCIN 250 MG PO TABS
500.0000 mg | ORAL_TABLET | Freq: Once | ORAL | Status: AC
  Administered 2019-08-19: 19:00:00 500 mg via ORAL
  Filled 2019-08-19: qty 2

## 2019-08-19 MED ORDER — BENZONATATE 100 MG PO CAPS: 100.0000 mg | ORAL_CAPSULE | Freq: Three times a day (TID) | ORAL | 0 refills | Status: DC

## 2019-08-19 MED ORDER — FUROSEMIDE 40 MG PO TABS
40.0000 mg | ORAL_TABLET | Freq: Once | ORAL | Status: AC
  Administered 2019-08-19: 21:00:00 40 mg via ORAL
  Filled 2019-08-19: qty 1

## 2019-08-19 MED ORDER — ALBUTEROL SULFATE HFA 108 (90 BASE) MCG/ACT IN AERS
2.0000 | INHALATION_SPRAY | RESPIRATORY_TRACT | Status: AC
  Administered 2019-08-19: 21:00:00 2 via RESPIRATORY_TRACT
  Filled 2019-08-19: qty 6.7

## 2019-08-19 NOTE — Discharge Instructions (Addendum)
Please take medications as prescribed. Your blood count is slightly lower than usual and liver tests slightly elevated, both of which could be attributable to your chemotherapy.  BNP which can reflect heart failure and fluid overload was also elevated which is why you received Lasix. Please be sure to bring these to the attention of your PCP on Monday.

## 2019-08-19 NOTE — ED Provider Notes (Signed)
Medical screening examination/treatment/procedure(s) were conducted as a shared visit with non-physician practitioner(s) and myself.  I personally evaluated the patient during the encounter.  Clinical Impression:   Final diagnoses:  Cough    This patient is a 85 year old male with a known history of malignant mesothelioma of the right pleura, he has been undergoing chemotherapy, he had a Pleurx catheter placed on the right for a persistent effusion which has improved over time.  He was seen 2 days ago at the cardiothoracic surgeons office and assessed, stable appearing, recently diagnosed with a very small blood clot on CT scan a couple of weeks ago and is now anticoagulated on rivaroxaban.  He has been seen recently by oncology and is planning to undergo more chemotherapy.  Presents today with shortness of breath, coughing, currently taking ciprofloxacin for urinary tract infection.  On my exam the patient coughs occasionally, he has no edema, his lungs are otherwise pretty clear.  He is bringing up some phlegm tinged with orange.  He states he drank a orange soda pop before coming in.  He has no abdominal discomfort or tenderness on my exam, he otherwise appears well with normal oxygen levels.  He has a Port-A-Cath accessed in the right upper chest and a Pleurx catheter in the right lower chest.  I have personally viewed his chest x-ray and find no signs of acute infiltrate.  It appears overall unchanged from prior.  Labs pending, urinalysis pending to make sure he is not Cipro resistant.  Anticipate treatment for bronchitis including likely zithromax (doxy allergy).   Noemi Chapel, MD 08/19/19 [id number]

## 2019-08-19 NOTE — ED Triage Notes (Signed)
Patient presents to the ED with SOB and cough since being seen at [facility] long.  Patient grandson at side states cough and SOB worsened in the last 2 days.  Per grandson, patient has history of blood clots, cancer and is currently on antibiotics for UTI.  Patient is on [place].

## 2019-08-19 NOTE — ED Notes (Signed)
Blood drawn from port

## 2019-08-19 NOTE — ED Provider Notes (Signed)
[facility] EMERGENCY DEPARTMENT Provider Note   CSN: [id number] Arrival date & time: 08/19/19  1635     History   Chief Complaint Chief Complaint  Patient presents with  . Shortness of Breath    HPI Jeffrey [name] is a 85 y.o. male with past medical history significant for recent myocardial infarction, HFrEF, mesothelioma, coronary artery disease, recent acute right pulmonary embolism, hypothyroidism, and recent pseudomonas UTI for which she has been taking ciprofloxacin.  Patient had a follow-up on his Pleurx catheter 2 days ago with CT surgery, but since then patient has been experiencing worsening shortness of breath, particularly on exertion, congestion, and productive cough.  Patient states that the sputum is slightly blood-tinged.  Patient denies any chest pain, fevers, chills, altered mental status, fatigue, nausea, vomiting, abdominal pain, runny nose, sore throat, headache, and states that his urinary symptoms have resolved.  Patient states that the ciprofloxacin has been making him nauseated so his doctor had advised that he take half doses, so he has been doing that.  Patient has taken Robitussin for cough, with no relief.       HPI  Past Medical History:  Diagnosis Date  . Anxiety   . Aortic stenosis    mild AS by 07/20/13 echo ([facility])  . Arthritis    "all over"  . Coronary artery disease    NSTEMI 06/2010, CABG x3=> [place]->LAD, SVG->OM1, SVG->PDA, DES LCx 10/2011, DES LAD 12/2011  . Depression   . GERD (gastroesophageal reflux disease)   . H/O hiatal hernia   . Hearing aid worn    B/L  . High cholesterol   . History of blood transfusion reaction 11-Mar-2013   "he almost died; he has to get irradiated blood next time"  . Hypertension   . Hypothyroidism   . Ischemic cardiomyopathy   . ITP (idiopathic thrombocytopenic purpura)    Dr. Gaynelle Arabian, on [place]  . Myocardial infarction ([place]) 03-11-10  . Pneumonia 1940's X 1; 2014/03/11 X 1  . Rheumatoid arthritis  ([place])   . Shortness of breath    with exertion, has not been very active  . Sleep apnea   . Wears glasses   . Wears partial dentures     Patient Active Problem List   Diagnosis Date Noted  . Pulmonary embolus ([place]) 08/05/2019  . Acute lower UTI 08/05/2019  . Pulmonary embolism ([place]) 08/05/2019  . Malignant mesothelioma of pleura ([place]) 06/16/2019  . Goals of care, counseling/discussion 06/16/2019  . Encounter for antineoplastic chemotherapy 06/16/2019  . NSTEMI (non-ST elevated myocardial infarction) ([place]) 06/01/2019  . Shortness of breath   . Coronary artery disease due to lipid rich plaque   . Elevated troponin   . Coronary artery disease involving native coronary artery of native heart with unstable angina pectoris ([place])   . Pleural effusion on right 05/14/2019  . Pancreatic lesion 05/14/2019  . PD (Parkinson's disease) ([place]) 12/02/2018  . Primary osteoarthritis of right hip 06/29/2018  . Primary osteoarthritis of left knee 11/24/2014  . Primary osteoarthritis of knee 11/24/2014  . Bacterial infection of knee joint ([place]) 08/17/2014  . Septic joint of left knee joint ([place]) 06/22/2014  . CAP (community acquired pneumonia) 01/26/2014  . CAD (coronary artery disease) of artery bypass graft 01/26/2014  . Hypothyroidism 01/26/2014  . Thrombocytopenia, unspecified ([place]) 01/26/2014  . Nausea alone 01/26/2014  . Effusion of knee joint, left 10/11/2012  . OA (osteoarthritis) of knee 10/11/2012  . HIP PAIN 11/15/2007  . SPONDYLOSIS  UNSPEC SITE W/O MENTION MYELOPATHY 11/15/2007    Past Surgical History:  Procedure Laterality Date  . APPENDECTOMY    . BACK SURGERY    . CATARACT EXTRACTION, BILATERAL    . CHEST TUBE INSERTION Right 05/20/2019   Procedure: INSERTION PLEURAL DRAINAGE CATHETER;  Surgeon: Ivin Poot, MD;  Location: [place];  Service: Thoracic;  Laterality: Right;  . COLONOSCOPY    . CORONARY ANGIOPLASTY WITH STENT PLACEMENT     DES Lcx 10/2011, DES LAD 12/2011  .  CORONARY ARTERY BYPASS GRAFT  2011   "CABG X3"  . CORONARY STENT INTERVENTION N/A 05/26/2019   Procedure: CORONARY STENT INTERVENTION;  Surgeon: Nelva Bush, MD;  Location: [place] [facility];  Service: Cardiovascular;  Laterality: N/A;  . GASTROC RECESSION EXTREMITY Right 07/06/2015   Pasty Spillers (orthopedics- [place], [place])  . HERNIA REPAIR    . HIATAL HERNIA REPAIR    . IR IMAGING GUIDED PORT INSERTION  06/29/2019  . KNEE ARTHROSCOPY Left 06/22/2014   w/I&D  . KNEE ARTHROSCOPY Left 06/22/2014   Procedure: IRRIGATION AND DEBRIDEMENT WITH CHRONDROPLASTY;  Surgeon: Hessie Dibble, MD;  Location: [place];  Service: Orthopedics;  Laterality: Left;  . LEFT HEART CATH AND CORS/GRAFTS ANGIOGRAPHY N/A 05/26/2019   Procedure: LEFT HEART CATH AND CORS/GRAFTS ANGIOGRAPHY;  Surgeon: Nelva Bush, MD;  Location: [place] [facility];  Service: Cardiovascular;  Laterality: N/A;  . LUMBAR DISC SURGERY  1960's?  . MULTIPLE TOOTH EXTRACTIONS    . PLEURAL BIOPSY Right 05/20/2019   Procedure: PLEURAL BIOPSY;  Surgeon: Ivin Poot, MD;  Location: [employer];  Service: Thoracic;  Laterality: Right;  . SHOULDER ARTHROSCOPY Right 04/09/2017   Procedure: ARTHROSCOPY SHOULDER;  Surgeon: Melrose Nakayama, MD;  Location: [place];  Service: Orthopedics;  Laterality: Right;  . STERNAL CLOSURE     "wires from OHS taken out; plate put in" (04/24/2991)  . SYNOVECTOMY Left 08/17/2014   Procedure: SYNOVECTOMY LEFT KNEE;  Surgeon: Hessie Dibble, MD;  Location: [place];  Service: Orthopedics;  Laterality: Left;  . TOTAL ANKLE ARTHROPLASTY Right 07/06/2015   Pasty Spillers (orthopedics- [facility])  . TOTAL HIP ARTHROPLASTY Right 06/29/2018   Procedure: RIGHT TOTAL HIP ARTHROPLASTY ANTERIOR APPROACH;  Surgeon: Melrose Nakayama, MD;  Location: [facility];  Service: Orthopedics;  Laterality: Right;  . TOTAL HIP ARTHROPLASTY Left 06/09/2016   Scott Streater Claiborne Billings (orthopedics- [place])  . TOTAL KNEE ARTHROPLASTY Left 11/24/2014   Procedure:  TOTAL KNEE ARTHROPLASTY;  Surgeon: Hessie Dibble, MD;  Location: [place];  Service: Orthopedics;  Laterality: Left;  [name] Kitchen VIDEO ASSISTED THORACOSCOPY Right 05/20/2019   Procedure: VIDEO ASSISTED THORACOSCOPY;  Surgeon: Ivin Poot, MD;  Location: [place];  Service: Thoracic;  Laterality: Right;        Home Medications    Prior to Admission medications   Medication Sig Start Date End Date Taking? Authorizing Provider  albuterol (VENTOLIN HFA) 108 (90 Base) MCG/ACT inhaler Inhale 1 puff into the lungs 2 (two) times daily as needed for shortness of breath. 07/23/19  Yes [provider]  ALPRAZolam (XANAX) 0.25 MG tablet Take 1 tablet (0.25 mg total) by mouth 2 (two) times daily as needed for anxiety. 07/02/18  Yes Loni Dolly, PA-C  chlorproMAZINE (THORAZINE) 25 MG tablet Take 1 tablet (25 mg total) by mouth 3 (three) times daily. 07/04/19  Yes Curt Bears, MD  ciprofloxacin (CIPRO) 250 MG tablet Take 250 mg by mouth 2 (two) times daily.   Yes [provider]  dexamethasone (DECADRON) 4 MG tablet 4 mg p.o. twice daily the day before, day of and day after chemotherapy every 3 weeks 06/16/19  Yes Curt Bears, MD  folic acid (FOLVITE) 1 MG tablet Take 1 tablet (1 mg total) by mouth daily. 06/16/19  Yes Curt Bears, MD  ipratropium-albuterol (DUONEB) 0.5-2.5 (3) MG/3ML SOLN Take 3 mLs by nebulization every 6 (six) hours as needed. J44.9 Patient taking differently: Take 3 mLs by nebulization every 6 (six) hours as needed (shortness of breath). J44.9 07/11/19  Yes Martyn Ehrich, NP  levothyroxine (SYNTHROID, LEVOTHROID) 200 MCG tablet Take 200 mcg by mouth at bedtime.    Yes [provider]  lidocaine-prilocaine (EMLA) cream Apply 1 application topically as needed. 06/21/19  Yes Curt Bears, MD  mirtazapine (REMERON) 15 MG tablet Take 15 mg by mouth at bedtime.   Yes [provider]  omeprazole (PRILOSEC) 40 MG capsule Take 40 mg by mouth at  bedtime.    Yes [provider]  prochlorperazine (COMPAZINE) 10 MG tablet Take 1 tablet (10 mg total) by mouth every 6 (six) hours as needed for nausea or vomiting. 08/09/19  Yes Rai, Ripudeep K, MD  rivaroxaban (XARELTO) 20 MG TABS tablet Take 1 tablet (20 mg total) by mouth daily with supper. Take when the starter pack is completed. 08/29/19  Yes Rai, Ripudeep K, MD  ticagrelor (BRILINTA) 90 MG TABS tablet Take 1 tablet (90 mg total) by mouth 2 (two) times daily. 05/28/19 08/26/19 Yes Elodia Florence., MD  azithromycin (ZITHROMAX Z-PAK) 250 MG tablet 500 mg PO Day 1 250 mg PO Day 2-5 08/19/19   Corena Herter, PA-C  benzonatate (TESSALON) 100 MG capsule Take 1 capsule (100 mg total) by mouth every 8 (eight) hours. 08/19/19   Corena Herter, PA-C  carbidopa-levodopa (SINEMET IR) 25-100 MG tablet Take 1 tablet by mouth 3 (three) times daily. Patient not taking: Reported on 08/16/2019 12/02/18   Tat, Eustace Quail, DO  Fluticasone-Salmeterol (ADVAIR) 100-50 MCG/DOSE AEPB Inhale 1 puff into the lungs daily.    [provider]  losartan (COZAAR) 50 MG tablet Take 50 mg by mouth daily. 06/11/19   [provider]  methotrexate (RHEUMATREX) 2.5 MG tablet Take 15 mg by mouth once a week. Caution:Chemotherapy. Protect from light.    [provider]  Omega-3 Fatty Acids (FISH OIL) 1200 MG CAPS Take 1,200 mg by mouth at bedtime. 360 MG OMEGA-3    [provider]  predniSONE (DELTASONE) 10 MG tablet Take 10 mg by mouth daily with breakfast.    [provider]  Rivaroxaban 15 & 20 MG TBPK Take as directed on package: Start with one 15mg  tablet by mouth twice a day with food. On Day 22 (08/29/19), switch to one 20mg  tablet once a day with food. Patient not taking: Reported on 08/19/2019 08/09/19   Rai, Vernelle Emerald, MD  rOPINIRole (REQUIP) 0.5 MG tablet Take 0.5 mg by mouth at bedtime.    [provider]  Spacer/Aero-Holding Chambers (AEROCHAMBER MV) inhaler  Use as instructed Patient not taking: Reported on 08/19/2019 06/01/19   Martyn Ehrich, NP  Vitamin D, Ergocalciferol, (DRISDOL) 1.25 MG (50000 UT) CAPS capsule Take 50,000 Units by mouth every 7 (seven) days.    [provider]    Family History Family History  Problem Relation Age of Onset  . Heart disease Other   . Arthritis Other   . Heart disease Mother   . Alzheimer's disease  Father   . Rheum arthritis Sister   . Rheum arthritis Brother   . Healthy Son     Social History Social History   Tobacco Use  . Smoking status: Former Smoker    Packs/day: 1.00    Years: 15.00    Pack years: 15.00    Types: Cigarettes    Quit date: 12/22/1966    Years since quitting: 52.6  . Smokeless tobacco: Former Systems developer    Types: Chew  . Tobacco comment: "quit smoking ~ late ~ 60's; quit chewing in the 1970's"  Substance Use Topics  . Alcohol use: No  . Drug use: No     Allergies   Lipitor [atorvastatin], Methylprednisolone, Morphine and related, Other, Sulfa antibiotics, and Doxycycline   Review of Systems Review of Systems Ten systems are reviewed and are negative for acute change except as noted in the HPI   Physical Exam Updated Vital Signs BP 126/80 (BP Location: Right Arm)   Pulse 81   Temp (!) 97.3 F (36.3 C) (Oral)   Resp 18   Ht 6\' 1"  (1.854 m)   Wt 90.7 kg   SpO2 100%   BMI 26.39 kg/m   Physical Exam Constitutional:      Appearance: Normal appearance.  HENT:     Head: Normocephalic and atraumatic.  Eyes:     Pupils: Pupils are equal, round, and reactive to light.  Cardiovascular:     Rate and Rhythm: Normal rate and regular rhythm.     Heart sounds: Murmur present.     Comments: Systolic murmur best appreciated right upper sternal border. Pulmonary:     Comments: No respiratory distress.  Coughing throughout exam. No wheezing, stridor, or rales appreciated. Abdominal:     General: Bowel sounds are normal.     Palpations: Abdomen is soft.      Tenderness: There is no abdominal tenderness. There is no guarding.  Musculoskeletal:     Right lower leg: No edema.     Left lower leg: No edema.  Neurological:     Mental Status: He is alert and oriented to person, place, and time.  Psychiatric:        Mood and Affect: Mood normal.        Behavior: Behavior normal.        Thought Content: Thought content normal.      ED Treatments / Results  Labs (all labs ordered are listed, but only abnormal results are displayed) Labs Reviewed  COMPREHENSIVE METABOLIC PANEL - Abnormal; Notable for the following components:      Result Value   Glucose, Bld 158 (*)    BUN 34 (*)    Calcium 8.5 (*)    Albumin 2.5 (*)    AST 79 (*)    ALT 104 (*)    All other components within normal limits  CBC WITH DIFFERENTIAL/PLATELET - Abnormal; Notable for the following components:   RBC 2.80 (*)    Hemoglobin 8.5 (*)    HCT 27.1 (*)    RDW 17.0 (*)    Neutro Abs 9.3 (*)    Lymphs Abs 0.6 (*)    All other components within normal limits  BRAIN NATRIURETIC PEPTIDE - Abnormal; Notable for the following components:   B Natriuretic Peptide 646.0 (*)    All other components within normal limits  TROPONIN I (HIGH SENSITIVITY)  TROPONIN I (HIGH SENSITIVITY)    EKG None  Radiology Dg Chest Portable 1 View  Result Date: 08/19/2019 CLINICAL  DATA:  Shortness of breath and cough for 2 weeks. This is worsened over the last 2 days. EXAM: PORTABLE CHEST 1 VIEW COMPARISON:  08/17/2019 FINDINGS: Power injectable Port-A-Cath tip: SVC. Prior CABG. Moderate enlargement of the cardiopericardial silhouette with chronic blunting of the right lateral costophrenic angle and indistinct opacity along the right hemidiaphragm. There is likely still a pleural drainage catheter at the right lung base although this is difficult to see because of all of the superimposed ECG lead wires. Upper zone pulmonary vascular prominence probably reflects pulmonary venous hypertension and  is increased from 08/17/2019. IMPRESSION: 1. Moderate enlargement of the cardiopericardial silhouette with continued small right pleural effusion and indistinct opacity at the right lung base. I suspect that the indwelling pleural drainage catheter still present although there many superimposed ECG lead wires in this vicinity making identification of the pleural drainage catheter difficult. 2. Increased cephalization of blood flow suggesting pulmonary venous hypertension. 3. Prior CABG. Electronically Signed   By: Van Clines M.D.   On: 08/19/2019 18:28    Procedures Procedures (including critical care time)  Medications Ordered in ED Medications  albuterol (VENTOLIN HFA) 108 (90 Base) MCG/ACT inhaler 2 puff (has no administration in time range)  furosemide (LASIX) tablet 40 mg (has no administration in time range)  azithromycin (ZITHROMAX) tablet 500 mg (500 mg Oral Given 08/19/19 1854)     Initial Impression / Assessment and Plan / ED Course  I have reviewed the triage vital signs and the nursing notes.  Pertinent labs & imaging results that were available during my care of the patient were reviewed by me and considered in my medical decision making (see chart for details).       Chest x-ray shows small right pleural effusion and pulmonary venous hypertension.  Patient has a Pleurx catheter in place due to mesothelioma and is being managed by CT surgery.  BNP was markedly elevated compared to 3 months ago.  Gave Lasix here in the ER.  Did not see any pitting edema or JVD on exam.  Given comorbidities, will prescribe Zithromax for bacterial coverage. Tessalon Vital signs are reassuring and patient is oxygenating well on room air.  Hemoglobin slightly lower than usual, potentially attributable to ongoing chemotherapy.  Transaminitis might also be explained by the chemo. Emphasized importance of follow-up with PCP on Monday.  Return precautions provided.   Final Clinical Impressions(s)  / ED Diagnoses   Final diagnoses:  Cough    ED Discharge Orders         Ordered    benzonatate (TESSALON) 100 MG capsule  Every 8 hours     08/19/19 2018    azithromycin (ZITHROMAX Z-PAK) 250 MG tablet     08/19/19 2018           Reita Chard 08/19/19 2042    Noemi Chapel, MD 08/19/19 2330

## 2019-08-20 ENCOUNTER — Other Ambulatory Visit: Payer: Self-pay

## 2019-08-20 ENCOUNTER — Encounter (HOSPITAL_COMMUNITY): Payer: Self-pay | Admitting: Emergency Medicine

## 2019-08-20 ENCOUNTER — Emergency Department (HOSPITAL_COMMUNITY): Payer: Medicare Other

## 2019-08-20 ENCOUNTER — Emergency Department (HOSPITAL_COMMUNITY)
Admission: EM | Admit: 2019-08-20 | Discharge: 2019-08-21 | Disposition: A | Discharge status: Home / Self Care | Payer: Medicare Other | Attending: Emergency Medicine | Admitting: Emergency Medicine

## 2019-08-20 DIAGNOSIS — Z87891 Personal history of nicotine dependence: Secondary | ICD-10-CM | POA: Insufficient documentation

## 2019-08-20 DIAGNOSIS — Z86711 Personal history of pulmonary embolism: Secondary | ICD-10-CM | POA: Insufficient documentation

## 2019-08-20 DIAGNOSIS — E785 Hyperlipidemia, unspecified: Secondary | ICD-10-CM | POA: Insufficient documentation

## 2019-08-20 DIAGNOSIS — R0602 Shortness of breath: Secondary | ICD-10-CM | POA: Diagnosis present

## 2019-08-20 DIAGNOSIS — Z7952 Long term (current) use of systemic steroids: Secondary | ICD-10-CM | POA: Insufficient documentation

## 2019-08-20 DIAGNOSIS — R0609 Other forms of dyspnea: Secondary | ICD-10-CM | POA: Insufficient documentation

## 2019-08-20 DIAGNOSIS — Z882 Allergy status to sulfonamides status: Secondary | ICD-10-CM | POA: Diagnosis not present

## 2019-08-20 DIAGNOSIS — Z79899 Other long term (current) drug therapy: Secondary | ICD-10-CM | POA: Insufficient documentation

## 2019-08-20 DIAGNOSIS — E860 Dehydration: Secondary | ICD-10-CM | POA: Diagnosis not present

## 2019-08-20 DIAGNOSIS — I2510 Atherosclerotic heart disease of native coronary artery without angina pectoris: Secondary | ICD-10-CM | POA: Insufficient documentation

## 2019-08-20 DIAGNOSIS — G20 Parkinson's disease: Secondary | ICD-10-CM | POA: Diagnosis not present

## 2019-08-20 DIAGNOSIS — R0600 Dyspnea, unspecified: Secondary | ICD-10-CM

## 2019-08-20 DIAGNOSIS — I252 Old myocardial infarction: Secondary | ICD-10-CM | POA: Diagnosis not present

## 2019-08-20 DIAGNOSIS — Z951 Presence of aortocoronary bypass graft: Secondary | ICD-10-CM | POA: Insufficient documentation

## 2019-08-20 DIAGNOSIS — I10 Essential (primary) hypertension: Secondary | ICD-10-CM | POA: Insufficient documentation

## 2019-08-20 LAB — CBC WITH DIFFERENTIAL/PLATELET
Abs Immature Granulocytes: 0.02 10*3/uL (ref 0.00–0.07)
Basophils Absolute: 0 10*3/uL (ref 0.0–0.1)
Basophils Relative: 0 %
Eosinophils Absolute: 0.1 10*3/uL (ref 0.0–0.5)
Eosinophils Relative: 2 %
HCT: 33.8 % — ABNORMAL LOW (ref 39.0–52.0)
Hemoglobin: 10.8 g/dL — ABNORMAL LOW (ref 13.0–17.0)
Immature Granulocytes: 0 %
Lymphocytes Relative: 17 %
Lymphs Abs: 1.2 10*3/uL (ref 0.7–4.0)
MCH: 30.1 pg (ref 26.0–34.0)
MCHC: 32 g/dL (ref 30.0–36.0)
MCV: 94.2 fL (ref 80.0–100.0)
Monocytes Absolute: 0 10*3/uL — ABNORMAL LOW (ref 0.1–1.0)
Monocytes Relative: 1 %
Neutro Abs: 5.8 10*3/uL (ref 1.7–7.7)
Neutrophils Relative %: 80 %
Platelets: 300 10*3/uL (ref 150–400)
RBC: 3.59 MIL/uL — ABNORMAL LOW (ref 4.22–5.81)
RDW: 17 % — ABNORMAL HIGH (ref 11.5–15.5)
WBC: 7.2 10*3/uL (ref 4.0–10.5)
nRBC: 0 % (ref 0.0–0.2)

## 2019-08-20 LAB — BASIC METABOLIC PANEL
Anion gap: 13 (ref 5–15)
BUN: 38 mg/dL — ABNORMAL HIGH (ref 8–23)
CO2: 23 mmol/L (ref 22–32)
Calcium: 9 mg/dL (ref 8.9–10.3)
Chloride: 97 mmol/L — ABNORMAL LOW (ref 98–111)
Creatinine, Ser: 1.35 mg/dL — ABNORMAL HIGH (ref 0.61–1.24)
GFR calc Af Amer: 56 mL/min — ABNORMAL LOW (ref >60)
GFR calc non Af Amer: 49 mL/min — ABNORMAL LOW (ref >60)
Glucose, Bld: 107 mg/dL — ABNORMAL HIGH (ref 70–99)
Potassium: 4 mmol/L (ref 3.5–5.1)
Sodium: 133 mmol/L — ABNORMAL LOW (ref 135–145)

## 2019-08-20 LAB — TROPONIN I (HIGH SENSITIVITY): Troponin I (High Sensitivity): 13 ng/L (ref <18)

## 2019-08-20 MED ORDER — ALPRAZOLAM 0.5 MG PO TABS
0.2500 mg | ORAL_TABLET | Freq: Once | ORAL | Status: AC
  Administered 2019-08-20: 22:00:00 0.25 mg via ORAL
  Filled 2019-08-20: qty 1

## 2019-08-20 NOTE — ED Triage Notes (Signed)
Patient complaining of shortness of breat. Patient states that he has been short of breath all day today. Patient was seen here last night and treated for heart failure. Patient does have a hx of lung cancer.

## 2019-08-20 NOTE — ED Provider Notes (Addendum)
[facility] EMERGENCY DEPARTMENT Provider Note   CSN: [id number] Arrival date & time: 08/20/19  03/17/2099     History   Chief Complaint Chief Complaint  Patient presents with   Shortness of Breath    HPI Jeffrey [name] is a 85 y.o. male.     Level 5 caveat for uncertainty of history.  Patient complains of persistent dyspnea.  He has known mesothelioma for which he is getting chemotherapy via the oncology department at [facility].  He is also being currently treated for a pulmonary embolism.  No chest pain, cough, rusty sputum, fever, chills.  Patient states Lasix last evening did not help.  Further history obtained from grandson.  Patient had previously been on Cipro for a urinary tract infection, but this was recently discontinued because the patient did not tolerate it well.  Patient has no obvious urinary symptoms.  Urine culture reviewed from 08/05/2019 and ciprofloxacin was the only oral antibiotic that pseudomonas aeruginosa was sensitive to.  Yolanda Bonine has requested a repeat urinalysis.     Past Medical History:  Diagnosis Date   Anxiety    Aortic stenosis    mild AS by 07/20/13 echo ([facility])   Arthritis    "all over"   Coronary artery disease    NSTEMI 06/2010, CABG x3=> [place]->LAD, SVG->OM1, SVG->PDA, DES LCx 10/2011, DES LAD 12/2011   Depression    GERD (gastroesophageal reflux disease)    H/O hiatal hernia    Hearing aid worn    B/L   High cholesterol    History of blood transfusion reaction 03/17/13   "he almost died; he has to get irradiated blood next time"   Hypertension    Hypothyroidism    Ischemic cardiomyopathy    ITP (idiopathic thrombocytopenic purpura)    Dr. Gaynelle Arabian, on Promacta   Myocardial infarction [facility]) 17-Mar-2010   Pneumonia 1940's X 1; 2014-03-17 X 1   Rheumatoid arthritis ([place])    Shortness of breath    with exertion, has not been very active   Sleep apnea    Wears glasses    Wears partial dentures      Patient Active Problem List   Diagnosis Date Noted   Pulmonary embolus ([place]) 08/05/2019   Acute lower UTI 08/05/2019   Pulmonary embolism ([place]) 08/05/2019   Malignant mesothelioma of pleura ([place]) 06/16/2019   Goals of care, counseling/discussion 06/16/2019   Encounter for antineoplastic chemotherapy 06/16/2019   NSTEMI (non-ST elevated myocardial infarction) ([place]) 06/01/2019   Shortness of breath    Coronary artery disease due to lipid rich plaque    Elevated troponin    Coronary artery disease involving native coronary artery of native heart with unstable angina pectoris (HCC)    Pleural effusion on right 05/14/2019   Pancreatic lesion 05/14/2019   PD (Parkinson's disease) ([place]) 12/02/2018   Primary osteoarthritis of right hip 06/29/2018   Primary osteoarthritis of left knee 11/24/2014   Primary osteoarthritis of knee 11/24/2014   Bacterial infection of knee joint ([place]) 08/17/2014   Septic joint of left knee joint ([place]) 06/22/2014   CAP (community acquired pneumonia) 01/26/2014   CAD (coronary artery disease) of artery bypass graft 01/26/2014   Hypothyroidism 01/26/2014   Thrombocytopenia, unspecified ([place]) 01/26/2014   Nausea alone 01/26/2014   Effusion of knee joint, left 10/11/2012   OA (osteoarthritis) of knee 10/11/2012   HIP PAIN 11/15/2007   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 11/15/2007    Past Surgical History:  Procedure Laterality  Date   APPENDECTOMY     BACK SURGERY     CATARACT EXTRACTION, BILATERAL     CHEST TUBE INSERTION Right 05/20/2019   Procedure: INSERTION PLEURAL DRAINAGE CATHETER;  Surgeon: Ivin Poot, MD;  Location: [place];  Service: Thoracic;  Laterality: Right;   COLONOSCOPY     CORONARY ANGIOPLASTY WITH STENT PLACEMENT     DES Lcx 10/2011, DES LAD 12/2011   CORONARY ARTERY BYPASS GRAFT  2011   "CABG X3"   CORONARY STENT INTERVENTION N/A 05/26/2019   Procedure: CORONARY STENT INTERVENTION;   Surgeon: Nelva Bush, MD;  Location: [place] [facility];  Service: Cardiovascular;  Laterality: N/A;   GASTROC RECESSION EXTREMITY Right 07/06/2015   Pasty Spillers (orthopedics- [place], [place])   HERNIA REPAIR     HIATAL HERNIA REPAIR     IR IMAGING GUIDED PORT INSERTION  06/29/2019   KNEE ARTHROSCOPY Left 06/22/2014   w/I&D   KNEE ARTHROSCOPY Left 06/22/2014   Procedure: IRRIGATION AND DEBRIDEMENT WITH CHRONDROPLASTY;  Surgeon: Hessie Dibble, MD;  Location: [place];  Service: Orthopedics;  Laterality: Left;   LEFT HEART CATH AND CORS/GRAFTS ANGIOGRAPHY N/A 05/26/2019   Procedure: LEFT HEART CATH AND CORS/GRAFTS ANGIOGRAPHY;  Surgeon: Nelva Bush, MD;  Location: [place] [facility];  Service: Cardiovascular;  Laterality: N/A;   LUMBAR DISC SURGERY  1960's?   MULTIPLE TOOTH EXTRACTIONS     PLEURAL BIOPSY Right 05/20/2019   Procedure: PLEURAL BIOPSY;  Surgeon: Ivin Poot, MD;  Location: [place];  Service: Thoracic;  Laterality: Right;   SHOULDER ARTHROSCOPY Right 04/09/2017   Procedure: ARTHROSCOPY SHOULDER;  Surgeon: Melrose Nakayama, MD;  Location: [place];  Service: Orthopedics;  Laterality: Right;   STERNAL CLOSURE     "wires from OHS taken out; plate put in" (07/26/4626)   SYNOVECTOMY Left 08/17/2014   Procedure: SYNOVECTOMY LEFT KNEE;  Surgeon: Hessie Dibble, MD;  Location: [place];  Service: Orthopedics;  Laterality: Left;   TOTAL ANKLE ARTHROPLASTY Right 07/06/2015   Pasty Spillers (orthopedics- [place])   TOTAL HIP ARTHROPLASTY Right 06/29/2018   Procedure: RIGHT TOTAL HIP ARTHROPLASTY ANTERIOR APPROACH;  Surgeon: Melrose Nakayama, MD;  Location: [facility];  Service: Orthopedics;  Laterality: Right;   TOTAL HIP ARTHROPLASTY Left 06/09/2016   Nicki Reaper Streater Claiborne Billings (orthopedics- [facility] [place])   TOTAL KNEE ARTHROPLASTY Left 11/24/2014   Procedure: TOTAL KNEE ARTHROPLASTY;  Surgeon: Hessie Dibble, MD;  Location: [place];  Service: Orthopedics;  Laterality: Left;   VIDEO ASSISTED  THORACOSCOPY Right 05/20/2019   Procedure: VIDEO ASSISTED THORACOSCOPY;  Surgeon: Ivin Poot, MD;  Location: [place];  Service: Thoracic;  Laterality: Right;        Home Medications    Prior to Admission medications   Medication Sig Start Date End Date Taking? Authorizing Provider  albuterol (VENTOLIN HFA) 108 (90 Base) MCG/ACT inhaler Inhale 1 puff into the lungs 2 (two) times daily as needed for shortness of breath. 07/23/19  Yes [provider]  ALPRAZolam (XANAX) 0.25 MG tablet Take 1 tablet (0.25 mg total) by mouth 2 (two) times daily as needed for anxiety. 07/02/18  Yes Loni Dolly, PA-C  azithromycin (ZITHROMAX Z-PAK) 250 MG tablet 500 mg PO Day 1 250 mg PO Day 2-5 Patient taking differently: Take 250-500 mg by mouth See admin instructions. 500 mg PO Day 1 250 mg PO Day 2-5 Prescribed on 08/19/2019 08/19/19  Yes Corena Herter, PA-C  benzonatate (TESSALON) 100 MG capsule Take 1 capsule (100 mg  total) by mouth every 8 (eight) hours. 08/19/19  Yes Corena Herter, PA-C  chlorproMAZINE (THORAZINE) 25 MG tablet Take 1 tablet (25 mg total) by mouth 3 (three) times daily. 07/04/19  Yes Curt Bears, MD  ciprofloxacin (CIPRO) 250 MG tablet Take 250 mg by mouth 2 (two) times daily.   Yes [provider]  dexamethasone (DECADRON) 4 MG tablet 4 mg p.o. twice daily the day before, day of and day after chemotherapy every 3 weeks 06/16/19  Yes Curt Bears, MD  Fluticasone-Salmeterol (ADVAIR) 100-50 MCG/DOSE AEPB Inhale 1 puff into the lungs daily.   Yes [provider]  folic acid (FOLVITE) 1 MG tablet Take 1 tablet (1 mg total) by mouth daily. 06/16/19  Yes Curt Bears, MD  ipratropium-albuterol (DUONEB) 0.5-2.5 (3) MG/3ML SOLN Take 3 mLs by nebulization every 6 (six) hours as needed. J44.9 Patient taking differently: Take 3 mLs by nebulization every 6 (six) hours as needed (shortness of breath). J44.9 07/11/19  Yes Martyn Ehrich, NP  levothyroxine  (SYNTHROID, LEVOTHROID) 200 MCG tablet Take 200 mcg by mouth at bedtime.    Yes [provider]  lidocaine-prilocaine (EMLA) cream Apply 1 application topically as needed. 06/21/19  Yes Curt Bears, MD  losartan (COZAAR) 50 MG tablet Take 50 mg by mouth daily. 06/11/19  Yes [provider]  methotrexate (RHEUMATREX) 2.5 MG tablet Take 15 mg by mouth once a week. Caution:Chemotherapy. Protect from light.   Yes [provider]  mirtazapine (REMERON) 15 MG tablet Take 15 mg by mouth at bedtime.   Yes [provider]  Omega-3 Fatty Acids (FISH OIL) 1200 MG CAPS Take 1,200 mg by mouth at bedtime. 360 MG OMEGA-3   Yes [provider]  omeprazole (PRILOSEC) 40 MG capsule Take 40 mg by mouth at bedtime.    Yes [provider]  predniSONE (DELTASONE) 10 MG tablet Take 10 mg by mouth daily with breakfast.   Yes [provider]  prochlorperazine (COMPAZINE) 10 MG tablet Take 1 tablet (10 mg total) by mouth every 6 (six) hours as needed for nausea or vomiting. 08/09/19  Yes Rai, Ripudeep K, MD  rivaroxaban (XARELTO) 20 MG TABS tablet Take 1 tablet (20 mg total) by mouth daily with supper. Take when the starter pack is completed. 08/29/19  Yes Rai, Ripudeep K, MD  rOPINIRole (REQUIP) 0.5 MG tablet Take 0.5 mg by mouth at bedtime.   Yes [provider]  ticagrelor (BRILINTA) 90 MG TABS tablet Take 1 tablet (90 mg total) by mouth 2 (two) times daily. 05/28/19 08/26/19 Yes Elodia Florence., MD  Vitamin D, Ergocalciferol, (DRISDOL) 1.25 MG (50000 UT) CAPS capsule Take 50,000 Units by mouth every 7 (seven) days.   Yes [provider]    Family History Family History  Problem Relation Age of Onset   Heart disease Other    Arthritis Other    Heart disease Mother    Alzheimer's disease Father    Rheum arthritis Sister    Rheum arthritis Brother    Healthy Son     Social History Social History   Tobacco Use   Smoking  status: Former Smoker    Packs/day: 1.00    Years: 15.00    Pack years: 15.00    Types: Cigarettes    Quit date: 12/22/1966    Years since quitting: 52.6   Smokeless tobacco: Former Systems developer    Types: Chew   Tobacco comment: "quit smoking ~ late ~ 60's; quit chewing  in the 1970's"  Substance Use Topics   Alcohol use: No   Drug use: No     Allergies   Lipitor [atorvastatin], Methylprednisolone, Morphine and related, Other, Sulfa antibiotics, and Doxycycline   Review of Systems Review of Systems  Unable to perform ROS: Other     Physical Exam Updated Vital Signs BP 126/78    Pulse 77    Temp 98.1 F (36.7 C) (Oral)    Resp 19    Ht 6\' 1"  (1.854 m)    Wt 90.7 kg    SpO2 99%    BMI 26.39 kg/m   Physical Exam Vitals signs and nursing note reviewed.  Constitutional:      Appearance: He is well-developed.     Comments: Normal vital signs.  HENT:     Head: Normocephalic and atraumatic.  Eyes:     Conjunctiva/sclera: Conjunctivae normal.  Neck:     Musculoskeletal: Neck supple.  Cardiovascular:     Rate and Rhythm: Normal rate and regular rhythm.  Pulmonary:     Effort: Pulmonary effort is normal.     Breath sounds: Normal breath sounds.     Comments: Decreased breath sounds at base. Abdominal:     General: Bowel sounds are normal.     Palpations: Abdomen is soft.  Musculoskeletal: Normal range of motion.  Skin:    General: Skin is warm and dry.     Comments: No peripheral edema.  Neurological:     Mental Status: He is alert and oriented to person, place, and time.  Psychiatric:        Behavior: Behavior normal.      ED Treatments / Results  Labs (all labs ordered are listed, but only abnormal results are displayed) Labs Reviewed  CBC WITH DIFFERENTIAL/PLATELET - Abnormal; Notable for the following components:      Result Value   RBC 3.59 (*)    Hemoglobin 10.8 (*)    HCT 33.8 (*)    RDW 17.0 (*)    Monocytes Absolute 0.0 (*)    All other components  within normal limits  BASIC METABOLIC PANEL - Abnormal; Notable for the following components:   Sodium 133 (*)    Chloride 97 (*)    Glucose, Bld 107 (*)    BUN 38 (*)    Creatinine, Ser 1.35 (*)    GFR calc non Af Amer 49 (*)    GFR calc Af Amer 56 (*)    All other components within normal limits  URINALYSIS, ROUTINE W REFLEX MICROSCOPIC  TROPONIN I (HIGH SENSITIVITY)  TROPONIN I (HIGH SENSITIVITY)    EKG None  Radiology Dg Chest Port 1 View  Result Date: 08/20/2019 CLINICAL DATA:  Dyspnea EXAM: PORTABLE CHEST 1 VIEW COMPARISON:  August 19, 2019 FINDINGS: The right-sided Port-A-Cath is stable in positioning. There is a persistent small right-sided pleural effusion. No pneumothorax. There is an unchanged airspace opacity at the right lung base. The heart size is stable. The patient is status post prior median sternotomy. IMPRESSION: No significant interval change. Persistent right-sided pleural effusion. Electronically Signed   By: Constance Holster M.D.   On: 08/20/2019 23:09   Dg Chest Portable 1 View  Result Date: 08/19/2019 CLINICAL DATA:  Shortness of breath and cough for 2 weeks. This is worsened over the last 2 days. EXAM: PORTABLE CHEST 1 VIEW COMPARISON:  08/17/2019 FINDINGS: Power injectable Port-A-Cath tip: SVC. Prior CABG. Moderate enlargement of the cardiopericardial silhouette with chronic blunting of the right lateral  costophrenic angle and indistinct opacity along the right hemidiaphragm. There is likely still a pleural drainage catheter at the right lung base although this is difficult to see because of all of the superimposed ECG lead wires. Upper zone pulmonary vascular prominence probably reflects pulmonary venous hypertension and is increased from 08/17/2019. IMPRESSION: 1. Moderate enlargement of the cardiopericardial silhouette with continued small right pleural effusion and indistinct opacity at the right lung base. I suspect that the indwelling pleural drainage  catheter still present although there many superimposed ECG lead wires in this vicinity making identification of the pleural drainage catheter difficult. 2. Increased cephalization of blood flow suggesting pulmonary venous hypertension. 3. Prior CABG. Electronically Signed   By: Van Clines M.D.   On: 08/19/2019 18:28    Procedures Procedures (including critical care time)  Medications Ordered in ED Medications  ALPRAZolam Duanne Moron) tablet 0.25 mg (0.25 mg Oral Given 08/20/19 2148)     Initial Impression / Assessment and Plan / ED Course  I have reviewed the triage vital signs and the nursing notes.  Pertinent labs & imaging results that were available during my care of the patient were reviewed by me and considered in my medical decision making (see chart for details).        Chief complaint dyspnea.  Patient is currently being treated for mesothelioma. Chest x-ray shows no significant change from yesterday.  Yolanda Bonine is concerned about his urinalysis.  This will be repeated tonight.  Discussed with Dr. Wynona Canes.  Anticipate discharge home.  Final Clinical Impressions(s) / ED Diagnoses   Final diagnoses:  Dyspnea, unspecified type    ED Discharge Orders    None       Nat Christen, MD 08/20/19 [id number]    Nat Christen, MD 08/20/19 [phone]

## 2019-08-21 ENCOUNTER — Emergency Department (HOSPITAL_COMMUNITY): Payer: Medicare Other

## 2019-08-21 LAB — URINALYSIS, ROUTINE W REFLEX MICROSCOPIC
Bilirubin Urine: NEGATIVE
Glucose, UA: NEGATIVE mg/dL
Hgb urine dipstick: NEGATIVE
Ketones, ur: NEGATIVE mg/dL
Nitrite: NEGATIVE
Protein, ur: 30 mg/dL — AB
Specific Gravity, Urine: 1.018 (ref 1.005–1.030)
WBC, UA: >50 WBC/hpf — ABNORMAL HIGH (ref 0–5)
pH: 5 (ref 5.0–8.0)

## 2019-08-21 LAB — TROPONIN I (HIGH SENSITIVITY): Troponin I (High Sensitivity): 13 ng/L (ref <18)

## 2019-08-21 MED ORDER — SODIUM CHLORIDE 0.9 % IV BOLUS
1000.0000 mL | Freq: Once | INTRAVENOUS | Status: AC
  Administered 2019-08-21: 02:00:00 1000 mL via INTRAVENOUS

## 2019-08-21 MED ORDER — HEPARIN SOD (PORK) LOCK FLUSH 100 UNIT/ML IV SOLN
500.0000 [IU] | Freq: Once | INTRAVENOUS | Status: AC
  Administered 2019-08-21: 05:00:00 500 [IU] via INTRAVENOUS
  Filled 2019-08-21: qty 5

## 2019-08-21 MED ORDER — IOHEXOL 350 MG/ML SOLN
100.0000 mL | Freq: Once | INTRAVENOUS | Status: AC | PRN
  Administered 2019-08-21: 01:00:00 100 mL via INTRAVENOUS

## 2019-08-21 NOTE — ED Provider Notes (Signed)
Pt left at change of shift.  Patient was to provide another urine sample, family states that the Cipro does not "agree with him".  But they cannot be specific about what it does.  Nursing staff reports he had an episode of feeling short of breath but his pulse ox was 94%.  She put him on oxygen although he was not hypoxic.  I did a CTA just to make sure there was no pulmonary embolus which she has a history of blood clots.  After his urine resulted I went in to talk to them.  We went through all of his test results.  The grandson who was at the bedside states he is so weak he had difficulty standing up to urinate.  He wants him to get some IV fluids.  He was given a liter of normal saline.  Recheck at 4:45 AM patient is gotten his IV fluids.  He was able to stand with nursing staff assistance and states he felt back to his usual.  His wife is ready for him to be discharged home.  Dg Chest 2 View  Result Date: 08/17/2019 CLINICAL DATA:  Pleural effusion EXAM: CHEST - 2 VIEW COMPARISON:  08/05/2019 FINDINGS: Prior median sternotomy. Right-sided chest port remains in place. Small caliber right pleural drainage catheter. Small loculated right sided pleural effusion with associated right basilar opacity is not significantly changed from prior. Left lung remains clear. No pneumothorax. IMPRESSION: Persistent loculated right-sided pleural effusion, not significantly changed from prior. Electronically Signed   By: Davina Poke M.D.   On: 08/17/2019 14:50   Dg Chest 2 View  Result Date: 08/05/2019 CLINICAL DATA:  Shortness of breath and fatigue. EXAM: CHEST - 2 VIEW COMPARISON:  July 13, 2019 FINDINGS: The right Port-A-Cath is stable. A right-sided pleural effusion and underlying opacity is stable. No other interval changes. The cardiomediastinal silhouette is stable. IMPRESSION: The right-sided pleural effusion with underlying opacity is stable. No acute interval changes. Electronically Signed   By: Dorise Bullion III M.D   On: 08/05/2019 14:07   Ct Angio Chest Pe W/cm &/or Wo Cm  Result Date: 08/21/2019 CLINICAL DATA:  85 year old male with shortness of breath with concern for pulmonary embolism. EXAM: CT ANGIOGRAPHY CHEST WITH CONTRAST TECHNIQUE: Multidetector CT imaging of the chest was performed using the standard protocol during bolus administration of intravenous contrast. Multiplanar CT image reconstructions and MIPs were obtained to evaluate the vascular anatomy. CONTRAST:  173mL OMNIPAQUE IOHEXOL 350 MG/ML SOLN COMPARISON:  Chest CT dated 08/05/2019 FINDINGS: Cardiovascular: There is mild cardiomegaly. No pericardial effusion. Three-vessel coronary vascular calcification. Advanced calcified and noncalcified plaque of the thoracic aorta. Evaluation of the pulmonary arteries is limited due to respiratory motion artifact and suboptimal visualization of the distal branches. No large or central pulmonary artery embolus identified. Right-sided Port-A-Cath with tip close to the cavoatrial junction. Mediastinum/Nodes: No hilar or mediastinal adenopathy. The esophagus is grossly unremarkable. No mediastinal fluid collection. Lungs/Pleura: There is a small right pleural effusion with right lower lobe compressive atelectasis although pneumonia is not excluded. A pleural catheter is noted with tip in the medial right lung base pleural surface. There is no pneumothorax. The central airways are patent. Upper Abdomen: Cirrhosis. The visualized upper abdomen is otherwise unremarkable. Musculoskeletal: Median sternotomy wires. Multilevel degenerative changes of the spine. No acute osseous pathology. Review of the MIP images confirms the above findings. IMPRESSION: 1. No CT evidence of central pulmonary artery embolus. 2. Small right pleural effusion with right lower  lobe compressive atelectasis. Pneumonia is not excluded. 3. Cirrhosis. 4. Aortic Atherosclerosis (ICD10-I70.0). Electronically Signed   By: Anner Crete  M.D.   On: 08/21/2019 01:26   Ct Angio Chest Pe W And/or Wo Contrast  Result Date: 08/05/2019 CLINICAL DATA:  Shortness of breath and fatigue since last night. PE suspected, high pretest probability. EXAM: CT ANGIOGRAPHY CHEST WITH CONTRAST TECHNIQUE: Multidetector CT imaging of the chest was performed using the standard protocol during bolus administration of intravenous contrast. Multiplanar CT image reconstructions and MIPs were obtained to evaluate the vascular anatomy. CONTRAST:  120mL OMNIPAQUE IOHEXOL 350 MG/ML SOLN COMPARISON:  Chest CT angiogram dated 05/23/2019. FINDINGS: Cardiovascular: Majority of the segmental and subsegmental pulmonary arteries cannot be definitively characterized due to patient breathing motion artifact, however, there is at least a single pulmonary embolism within a peripheral subsegmental branch to the posterior segment of the RIGHT lower lobe. Additional peripheral pulmonary emboli suspected. No central obstructing pulmonary embolism identified within the main or intralobar pulmonary arteries bilaterally. No thoracic aortic aneurysm or evidence of aortic dissection. Diffuse aortic atherosclerosis. Cardiomegaly. No pericardial effusion. Diffuse coronary artery calcifications. Status post median sternotomy for CABG. Mediastinum/Nodes: No mass or enlarged lymph nodes are seen within the mediastinum or perihilar regions. Esophagus is unremarkable.z trachea appears normal. Lungs/Pleura: Chronic atelectasis and pleural thickening at the RIGHT lung base. No evidence of pneumonia or pulmonary edema. No pleural effusion or pneumothorax seen. Upper Abdomen: No acute findings. Musculoskeletal: No acute or suspicious osseous findings. Degenerative spondylosis of the thoracic spine, mild to moderate in degree. Review of the MIP images confirms the above findings. IMPRESSION: 1. At least a single pulmonary embolism within a peripheral subsegmental branch to the posterior segment of the RIGHT  lower lobe. Additional peripheral pulmonary emboli suspected, however, majority of the segmental and subsegmental pulmonary arteries cannot be definitively characterized due to patient breathing motion artifact. No central obstructing pulmonary embolism is identified within the main or intralobar pulmonary arteries bilaterally. 2. Cardiomegaly. No pericardial effusion. 3. Chronic atelectasis/pleural thickening at the RIGHT lung base. No evidence of pneumonia or pulmonary edema. No pleural effusion. Aortic Atherosclerosis (ICD10-I70.0). Electronically Signed   By: Franki Cabot M.D.   On: 08/05/2019 19:37   Dg Chest Port 1 View  Result Date: 08/20/2019 CLINICAL DATA:  Dyspnea EXAM: PORTABLE CHEST 1 VIEW COMPARISON:  August 19, 2019 FINDINGS: The right-sided Port-A-Cath is stable in positioning. There is a persistent small right-sided pleural effusion. No pneumothorax. There is an unchanged airspace opacity at the right lung base. The heart size is stable. The patient is status post prior median sternotomy. IMPRESSION: No significant interval change. Persistent right-sided pleural effusion. Electronically Signed   By: Constance Holster M.D.   On: 08/20/2019 23:09   Dg Chest Portable 1 View  Result Date: 08/19/2019 CLINICAL DATA:  Shortness of breath and cough for 2 weeks. This is worsened over the last 2 days. EXAM: PORTABLE CHEST 1 VIEW COMPARISON:  08/17/2019 FINDINGS: Power injectable Port-A-Cath tip: SVC. Prior CABG. Moderate enlargement of the cardiopericardial silhouette with chronic blunting of the right lateral costophrenic angle and indistinct opacity along the right hemidiaphragm. There is likely still a pleural drainage catheter at the right lung base although this is difficult to see because of all of the superimposed ECG lead wires. Upper zone pulmonary vascular prominence probably reflects pulmonary venous hypertension and is increased from 08/17/2019. IMPRESSION: 1. Moderate enlargement of the  cardiopericardial silhouette with continued small right pleural effusion and indistinct opacity at the  right lung base. I suspect that the indwelling pleural drainage catheter still present although there many superimposed ECG lead wires in this vicinity making identification of the pleural drainage catheter difficult. 2. Increased cephalization of blood flow suggesting pulmonary venous hypertension. 3. Prior CABG. Electronically Signed   By: Van Clines M.D.   On: 08/19/2019 18:28   Vas [place] Lower Extremity Venous (dvt)  Result Date: 08/07/2019  Lower Venous Study Indications: Pulmonary embolism.  Comparison Study: 05/19/19 negative Performing Technologist: June Leap RDMS, RVT  Examination Guidelines: A complete evaluation includes B-mode imaging, spectral Doppler, color Doppler, and power Doppler as needed of all accessible portions of each vessel. Bilateral testing is considered an integral part of a complete examination. Limited examinations for reoccurring indications may be performed as noted.  +---------+---------------+---------+-----------+----------+-------+ RIGHT    CompressibilityPhasicitySpontaneityPropertiesSummary +---------+---------------+---------+-----------+----------+-------+ CFV      Full           Yes      Yes                          +---------+---------------+---------+-----------+----------+-------+ SFJ      Full                                                 +---------+---------------+---------+-----------+----------+-------+ FV Prox  Full                                                 +---------+---------------+---------+-----------+----------+-------+ FV Mid   Full                                                 +---------+---------------+---------+-----------+----------+-------+ FV DistalFull                                                 +---------+---------------+---------+-----------+----------+-------+ PFV      Full                                                  +---------+---------------+---------+-----------+----------+-------+ POP      Full           Yes      Yes                          +---------+---------------+---------+-----------+----------+-------+ PTV      Full                                                 +---------+---------------+---------+-----------+----------+-------+ PERO     Full                                                 +---------+---------------+---------+-----------+----------+-------+   +---------+---------------+---------+-----------+----------+-------+  LEFT     CompressibilityPhasicitySpontaneityPropertiesSummary +---------+---------------+---------+-----------+----------+-------+ CFV      Full           Yes      Yes                          +---------+---------------+---------+-----------+----------+-------+ SFJ      Full                                                 +---------+---------------+---------+-----------+----------+-------+ FV Prox  Full                                                 +---------+---------------+---------+-----------+----------+-------+ FV Mid   Full                                                 +---------+---------------+---------+-----------+----------+-------+ FV DistalFull                                                 +---------+---------------+---------+-----------+----------+-------+ PFV      Full                                                 +---------+---------------+---------+-----------+----------+-------+ POP      Full           Yes      Yes                          +---------+---------------+---------+-----------+----------+-------+ PTV      Full                                                 +---------+---------------+---------+-----------+----------+-------+ PERO     Full                                                  +---------+---------------+---------+-----------+----------+-------+     Summary: Right: There is no evidence of deep vein thrombosis in the lower extremity. A cystic structure is found in the popliteal fossa. Left: There is no evidence of deep vein thrombosis in the lower extremity. No cystic structure found in the popliteal fossa. Incidental arterial finding: Left CFA atypical plaque formation anterior wall- possibly ulcerative plaque. Left Pop A significant  focal plaque causing >50% stenosis.  *See table(s) above for measurements and observations. Electronically signed by Ruta Hinds MD on 08/07/2019 at 12:13:52 PM.    Final       Rolland Porter, MD 08/21/19 0500

## 2019-08-21 NOTE — ED Notes (Signed)
Pt and wife given discharge instructions. Verbalize understanding. No questions at this time. Signature pad not working at this time.

## 2019-08-21 NOTE — ED Notes (Signed)
Patient back from CT scan.

## 2019-08-21 NOTE — Discharge Instructions (Addendum)
Try to get him to drink more fluids, he appeared to be dehydrated tonight.  Please follow-up with his primary care doctor this week.

## 2019-08-22 ENCOUNTER — Ambulatory Visit: Payer: Medicare Other | Admitting: Cardiology

## 2019-08-22 ENCOUNTER — Telehealth: Payer: Self-pay | Admitting: *Deleted

## 2019-08-22 ENCOUNTER — Telehealth: Payer: Self-pay | Admitting: Medical Oncology

## 2019-08-22 NOTE — Telephone Encounter (Signed)
Does not feel well -generalized weakness recent UTI. He went to ED x 2 Friday and sat received IVF  . Pt  stopped cipro at lower dose because it made him feel so bad. Per Dr Julien Nordmann I instructed wife to contact Dr Shearon Stalls today  and get appt for f/u UTI. Appropriate records faxed to Dr Shearon Stalls and office assistant aware that pt needs appt.

## 2019-08-22 NOTE — Telephone Encounter (Signed)
"  Jeffrey [name] dtr-n-law Jeffrey [name] [phone]).  Quit Cipro yesterday and the emergency room wants his oncologist to administer IV antibiotic today.  In ED Friday and Saturday for UTI.  Cipro made him weak with shortness of breath, he would not eat and possibly dehydrated; ED gave IVF."

## 2019-08-23 ENCOUNTER — Encounter: Payer: Self-pay | Admitting: Primary Care

## 2019-08-23 ENCOUNTER — Other Ambulatory Visit: Payer: Self-pay | Admitting: Internal Medicine

## 2019-08-23 ENCOUNTER — Inpatient Hospital Stay: Payer: Medicare Other | Attending: Internal Medicine

## 2019-08-23 ENCOUNTER — Ambulatory Visit (INDEPENDENT_AMBULATORY_CARE_PROVIDER_SITE_OTHER): Payer: Medicare Other | Admitting: Primary Care

## 2019-08-23 ENCOUNTER — Other Ambulatory Visit: Payer: Self-pay

## 2019-08-23 VITALS — BP 110/82 | HR 78 | Ht 73.0 in | Wt 190.6 lb

## 2019-08-23 DIAGNOSIS — J029 Acute pharyngitis, unspecified: Secondary | ICD-10-CM

## 2019-08-23 DIAGNOSIS — G4730 Sleep apnea, unspecified: Secondary | ICD-10-CM | POA: Diagnosis not present

## 2019-08-23 DIAGNOSIS — J90 Pleural effusion, not elsewhere classified: Secondary | ICD-10-CM

## 2019-08-23 DIAGNOSIS — N390 Urinary tract infection, site not specified: Secondary | ICD-10-CM | POA: Diagnosis not present

## 2019-08-23 DIAGNOSIS — E860 Dehydration: Secondary | ICD-10-CM | POA: Diagnosis not present

## 2019-08-23 DIAGNOSIS — R319 Hematuria, unspecified: Secondary | ICD-10-CM | POA: Insufficient documentation

## 2019-08-23 DIAGNOSIS — M069 Rheumatoid arthritis, unspecified: Secondary | ICD-10-CM | POA: Insufficient documentation

## 2019-08-23 DIAGNOSIS — Z9221 Personal history of antineoplastic chemotherapy: Secondary | ICD-10-CM | POA: Diagnosis not present

## 2019-08-23 DIAGNOSIS — C450 Mesothelioma of pleura: Secondary | ICD-10-CM | POA: Insufficient documentation

## 2019-08-23 DIAGNOSIS — I255 Ischemic cardiomyopathy: Secondary | ICD-10-CM | POA: Diagnosis not present

## 2019-08-23 DIAGNOSIS — D6481 Anemia due to antineoplastic chemotherapy: Secondary | ICD-10-CM | POA: Diagnosis not present

## 2019-08-23 DIAGNOSIS — I10 Essential (primary) hypertension: Secondary | ICD-10-CM | POA: Insufficient documentation

## 2019-08-23 DIAGNOSIS — Z79899 Other long term (current) drug therapy: Secondary | ICD-10-CM | POA: Diagnosis not present

## 2019-08-23 DIAGNOSIS — I252 Old myocardial infarction: Secondary | ICD-10-CM | POA: Insufficient documentation

## 2019-08-23 DIAGNOSIS — I350 Nonrheumatic aortic (valve) stenosis: Secondary | ICD-10-CM | POA: Diagnosis not present

## 2019-08-23 DIAGNOSIS — B370 Candidal stomatitis: Secondary | ICD-10-CM | POA: Insufficient documentation

## 2019-08-23 DIAGNOSIS — T451X5A Adverse effect of antineoplastic and immunosuppressive drugs, initial encounter: Secondary | ICD-10-CM | POA: Diagnosis not present

## 2019-08-23 DIAGNOSIS — K219 Gastro-esophageal reflux disease without esophagitis: Secondary | ICD-10-CM | POA: Diagnosis not present

## 2019-08-23 DIAGNOSIS — J449 Chronic obstructive pulmonary disease, unspecified: Secondary | ICD-10-CM | POA: Diagnosis not present

## 2019-08-23 DIAGNOSIS — I2510 Atherosclerotic heart disease of native coronary artery without angina pectoris: Secondary | ICD-10-CM | POA: Insufficient documentation

## 2019-08-23 DIAGNOSIS — E039 Hypothyroidism, unspecified: Secondary | ICD-10-CM | POA: Diagnosis not present

## 2019-08-23 DIAGNOSIS — F418 Other specified anxiety disorders: Secondary | ICD-10-CM | POA: Insufficient documentation

## 2019-08-23 DIAGNOSIS — E7800 Pure hypercholesterolemia, unspecified: Secondary | ICD-10-CM | POA: Insufficient documentation

## 2019-08-23 LAB — CBC WITH DIFFERENTIAL (CANCER CENTER ONLY)
Abs Immature Granulocytes: 0.04 10*3/uL (ref 0.00–0.07)
Basophils Absolute: 0 10*3/uL (ref 0.0–0.1)
Basophils Relative: 0 %
Eosinophils Absolute: 0.2 10*3/uL (ref 0.0–0.5)
Eosinophils Relative: 9 %
HCT: 32.4 % — ABNORMAL LOW (ref 39.0–52.0)
Hemoglobin: 10.1 g/dL — ABNORMAL LOW (ref 13.0–17.0)
Immature Granulocytes: 2 %
Lymphocytes Relative: 45 %
Lymphs Abs: 1.1 10*3/uL (ref 0.7–4.0)
MCH: 29.4 pg (ref 26.0–34.0)
MCHC: 31.2 g/dL (ref 30.0–36.0)
MCV: 94.2 fL (ref 80.0–100.0)
Monocytes Absolute: 0 10*3/uL — ABNORMAL LOW (ref 0.1–1.0)
Monocytes Relative: 2 %
Neutro Abs: 1 10*3/uL — ABNORMAL LOW (ref 1.7–7.7)
Neutrophils Relative %: 42 %
Platelet Count: 174 10*3/uL (ref 150–400)
RBC: 3.44 MIL/uL — ABNORMAL LOW (ref 4.22–5.81)
RDW: 16.7 % — ABNORMAL HIGH (ref 11.5–15.5)
WBC Count: 2.5 10*3/uL — ABNORMAL LOW (ref 4.0–10.5)
nRBC: 0 % (ref 0.0–0.2)

## 2019-08-23 LAB — CMP (CANCER CENTER ONLY)
ALT: 53 U/L — ABNORMAL HIGH (ref 0–44)
AST: 24 U/L (ref 15–41)
Albumin: 2.8 g/dL — ABNORMAL LOW (ref 3.5–5.0)
Alkaline Phosphatase: 104 U/L (ref 38–126)
Anion gap: 8 (ref 5–15)
BUN: 25 mg/dL — ABNORMAL HIGH (ref 8–23)
CO2: 27 mmol/L (ref 22–32)
Calcium: 9 mg/dL (ref 8.9–10.3)
Chloride: 104 mmol/L (ref 98–111)
Creatinine: 1.23 mg/dL (ref 0.61–1.24)
GFR, Est AFR Am: >60 mL/min (ref >60)
GFR, Estimated: 54 mL/min — ABNORMAL LOW (ref >60)
Glucose, Bld: 107 mg/dL — ABNORMAL HIGH (ref 70–99)
Potassium: 4.5 mmol/L (ref 3.5–5.1)
Sodium: 139 mmol/L (ref 135–145)
Total Bilirubin: 0.5 mg/dL (ref 0.3–1.2)
Total Protein: 7.2 g/dL (ref 6.5–8.1)

## 2019-08-23 LAB — STREP COMPLETE PANEL
Strep Pyogenes: NEGATIVE
Strep dysgalactiae: NEGATIVE

## 2019-08-23 LAB — URINALYSIS, COMPLETE (UACMP) WITH MICROSCOPIC
Bacteria, UA: NONE SEEN
Bilirubin Urine: NEGATIVE
Glucose, UA: NEGATIVE mg/dL
Hgb urine dipstick: NEGATIVE
Ketones, ur: NEGATIVE mg/dL
Nitrite: NEGATIVE
Protein, ur: 30 mg/dL — AB
Specific Gravity, Urine: 1.017 (ref 1.005–1.030)
pH: 6 (ref 5.0–8.0)

## 2019-08-23 MED ORDER — GUAIFENESIN ER 600 MG PO TB12: 600.0000 mg | ORAL_TABLET | Freq: Two times a day (BID) | ORAL | 1 refills | Status: DC

## 2019-08-23 MED ORDER — NYSTATIN 100000 UNIT/ML MT SUSP: 5.0000 mL | Freq: Four times a day (QID) | OROMUCOSAL | 0 refills | Status: AC

## 2019-08-23 MED ORDER — NYSTATIN 100000 UNIT/ML MT SUSP: 5.0000 mL | Freq: Four times a day (QID) | OROMUCOSAL | 0 refills | Status: DC

## 2019-08-23 MED ORDER — FLUTICASONE-SALMETEROL 100-50 MCG/DOSE IN AEPB: 1.0000 | INHALATION_SPRAY | Freq: Two times a day (BID) | RESPIRATORY_TRACT | 3 refills | Status: DC

## 2019-08-23 NOTE — Assessment & Plan Note (Signed)
-   Following with Dr. 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

## 2019-08-23 NOTE — Progress Notes (Signed)
Please let patient know strep negative

## 2019-08-23 NOTE — Assessment & Plan Note (Addendum)
-   Nystatin QID  - Throat culture

## 2019-08-23 NOTE — Assessment & Plan Note (Signed)
-   Receiving chemotherapy every 3 weeks, last dose was on Thursday 08/18/19

## 2019-08-23 NOTE — Assessment & Plan Note (Addendum)
-   Hx COPD/asthma, no PFTs on file - Resume Advair 1 puff twice daily - Albuterol hfa/duoneb q6 hours AS NEEDED for shortness of breath/wheezing - Start mucinex twice daily for congestion - Use incentive spirometer 2-3 times a day  - Sputum culture if able

## 2019-08-23 NOTE — Progress Notes (Signed)
Reviewed and agree with assessment/plan.   Evaristo Tsuda, MD [facility] 12/17/2016, 12:24 PM Pager:  [phone]  

## 2019-08-23 NOTE — Addendum Note (Signed)
Addended by: Vivia Ewing on: 08/23/2019 05:07 PM   Modules accepted: Orders

## 2019-08-23 NOTE — Progress Notes (Signed)
@Patient  ID: Jeffrey [name], male    DOB: 27-Mar-1934, 85 y.o.   MRN: [id number]  Chief Complaint  Patient presents with  . Follow-up    Reports having increased SOB and wheezing. Increased mucous production that is getting him choked. Reports chest tightness and sore throat. Increased fatigue. Labs done today. Currently taking zpack, started 08/29. Taking prednisone 10mg .     Referring provider: [name], MD  HPI: 85 year old male, former smoker quit 1968 (15-pack-year history). 10 year history of asbestos exposure from working in [employer] 40 years ago. Past medical history significant for malignant pleural effusion mesothelioma, COPD/asthma, OSA, Parkinson's disease, rheumatoid arthritis, hypothyroidism, MRSE septic arthritis left knee, left total hip arthroplasty, anemia/ITP, coronary artery disease s/p CABG, mild aortic stenosis, anxiety. He is new patient to [facility] pulmonary office, he was see in inpatient by Dr. Elsworth Soho.  Recent hospitalization from 5/23-6/6 for shortness of breath and right pleural effusion. Consulted inpatient by Dr. Nelda Marseille and Dr. Elsworth Soho with pulmonary. CT chest 5/23 showed loculated right pleural effusion and pleural plaques.  CTA on 6/1 showed no PE. Status post right thoracentesis on 5/25. Fluid culture showed lymphocytic exudate with low glucose, felt to be unlikely rheumatoid effusion. Cytology negative with a few mesothelial cells. Uncertain for mesothelioma related to asbestos exposure.  Since [facility] consulted and patient had right VATS with pleural biopsy, TALC and right Pleurx catheter by [name] on 5/29.  Course complicated by an NSTEMI, status post catheterization per cardiology 6/4 with PCI to proximal LCx with DES.  Recommending aspirin and Brilinta x3 months and then Brilinta x9 months. Plan drainage pleurx MWF. Surgical pathology is still pending- contacted lab and was told pathology was sent for second opinion.  Previous LB  encounter: 06/01/2019 Patient presents today for hospital follow-up visit. Accompanied by his wife.  Patient is doing okay considering.  He continues to have moderate shortness of breath. He has been using Advair diskus twice daily. Patient does not have any rescue inhaler. No PFTs on file, he is a former smoker. He had a chest x-ray yesterday with [facility]CTS that showed unchanged small right pleural effusion and bibasilar atelectasis/scarring. Right Pleurx catheter is being drained three times a week. Wife states that it has been draining on average 150cc each time. He does not have much appetite.  He has tried protein shakes but does not like how thick they are. His mood is somewhat depressed/anxious.   07/06/2019 Patient presents today for 4 week follow-up. He was started on Spiriva at last visit. Continue Advair twice daily. Breathing has been ok. Continues Advair twice daily and spiriva once daily. Reports no noticeable improvement with addition of Spiriva 1.32mcg. He reports most noticeable benefit from his Albuterol rescue inhaler. Asking if he can get drainage bottles for his pleurex catheter. His appetite has improved.   Following with Dr. Nils Pyle office, seen on 6/22. Impression recurrent right pleural effusion secondary to mesothelioma. Continues pleurx drain scheduled but reduced to Mon-Thursday. Plan to repeat CXR in 1 month. Referred to thoracic oncology to discuss right malignant epithelioid pleural tumor. Seen by Dr. Julien Nordmann with Oncology on June 25th, ordered PET scan to r/o metastatic disease. Family wanted treatment; plan pallative systemic chemo with carboplatin.    08/23/2019 Patient presents today for acute visit with complaints of shortness of breath, cough and sore throat x 3-7 days. No accompanied, wife in car. Reports constant shortness of breath and associated wheezing. Cough is productive, states mucus is so thick  that he almost chokes on it. He is not currently taking Advair as  prescribed. He is using Albuterol hfa 3-4 times a day and needs refill. Feels his tongue has chalk on it, reporting that it is "rough/dry". States that he is forcing himself to eat and drink. Right pleurx catheter is being drained twice a week by his wife approx 150-290ml. Receiving chemotherapy every 3 weeks, last dose was on Thursday 8/27. Denies fever.   He was admitted to the hospital in mid-August 2020 and diagnosed with acute pulmonary embolism. CT angiogram of the chest showed single pulmonary embolism within a peripheral subsegmental branch to the posterior segment of right lower lobe, additional peripheral PE suspected. Started on Xarelto 15mg  twice daily x 3 weeks, then 20mg  daily.  Recently seen in ED on 08/19/19 and 08/20/19 for similar symptoms. O2 saturation 94% RA. BNP was elevated at 646 and received IV lasix. Urine culture positive for pseudomonas. Treated with Ciprofloxacin for urinary tract infection which he states completely drained him. He took two days of medication and was then changed to azithromycin.    Imaging: 08/05/19 CTA- showed at least a single pulmonary embolism within a peripheral subsegmental branch to the posterior segment of the RIGHT lower lobe. Additional peripheral pulmonary emboli suspected. No central obstructing pulmonary embolism is identified within the main or intralobar pulmonary arteries bilaterally. Cardiomegaly. No pericardial effusion. Chronic atelectasis/pleural thickening at the RIGHT lung base. No evidence of pneumonia or pulmonary edema. No pleural effusion  06/30/19 PET- Thin rind of pleural tumor encasing the right lung identified and is FDG compatible with malignant mesothelioma. Small loculated right pleural effusion. Mildly FDG avid right hilar, subcarinal and high left paratracheal lymph nodes which may reflect metastatic adenopathy  Allergies  Allergen Reactions  . Lipitor [Atorvastatin] Other (See Comments)    muscle spasms cramps  .  Methylprednisolone Other (See Comments)    cramps cramps  . Morphine And Related     Hallucinations at times  . Other     If patient is to receive blood - blood must be treated witgh radiation because his body will not accept a normal infusion   . Sulfa Antibiotics Itching  . Doxycycline Rash    Doxycycline caused itching redness on scalp and head Doxycycline caused itching redness on scalp and head    Immunization History  Administered Date(s) Administered  . Influenza,inj,Quad PF,6+ Mos 09/04/2014  . Pneumococcal Polysaccharide-23 09/05/2011  . Tdap 09/16/2012    Past Medical History:  Diagnosis Date  . Anxiety   . Aortic stenosis    mild AS by 07/20/13 echo ([facility])  . Arthritis    "all over"  . Coronary artery disease    NSTEMI 06/2010, CABG x3=> [place]->LAD, SVG->OM1, SVG->PDA, DES LCx 10/2011, DES LAD 12/2011  . Depression   . GERD (gastroesophageal reflux disease)   . H/O hiatal hernia   . Hearing aid worn    B/L  . High cholesterol   . History of blood transfusion reaction 03/09/2013   "he almost died; he has to get irradiated blood next time"  . Hypertension   . Hypothyroidism   . Ischemic cardiomyopathy   . ITP (idiopathic thrombocytopenic purpura)    Dr. Gaynelle Arabian, on [place]  . Myocardial infarction ([place]) 03-09-2010  . Pneumonia 1940's X 1; March 09, 2014 X 1  . Rheumatoid arthritis ([place])   . Shortness of breath    with exertion, has not been very active  . Sleep apnea   .  Wears glasses   . Wears partial dentures     Tobacco History: Social History   Tobacco Use  Smoking Status Former Smoker  . Packs/day: 1.00  . Years: 15.00  . Pack years: 15.00  . Types: Cigarettes  . Quit date: 12/22/1966  . Years since quitting: 52.7  Smokeless Tobacco Former Systems developer  . Types: Chew  Tobacco Comment   "quit smoking ~ late ~ 60's; quit chewing in the 1970's"   Counseling given: Not Answered Comment: "quit smoking ~ late ~ 60's; quit chewing in the  1970's"   Outpatient Medications Prior to Visit  Medication Sig Dispense Refill  . albuterol (VENTOLIN HFA) 108 (90 Base) MCG/ACT inhaler Inhale 1 puff into the lungs 2 (two) times daily as needed for shortness of breath.    . ALPRAZolam (XANAX) 0.25 MG tablet Take 1 tablet (0.25 mg total) by mouth 2 (two) times daily as needed for anxiety. 20 tablet 0  . azithromycin (ZITHROMAX Z-PAK) 250 MG tablet 500 mg PO Day 1 250 mg PO Day 2-5 (Patient taking differently: Take 250-500 mg by mouth See admin instructions. 500 mg PO Day 1 250 mg PO Day 2-5 Prescribed on 08/19/2019) 6 tablet 0  . benzonatate (TESSALON) 100 MG capsule Take 1 capsule (100 mg total) by mouth every 8 (eight) hours. 21 capsule 0  . chlorproMAZINE (THORAZINE) 25 MG tablet Take 1 tablet (25 mg total) by mouth 3 (three) times daily. 30 tablet 0  . dexamethasone (DECADRON) 4 MG tablet 4 mg p.o. twice daily the day before, day of and day after chemotherapy every 3 weeks 40 tablet 1  . folic acid (FOLVITE) 1 MG tablet Take 1 tablet (1 mg total) by mouth daily. 30 tablet 4  . ipratropium-albuterol (DUONEB) 0.5-2.5 (3) MG/3ML SOLN Take 3 mLs by nebulization every 6 (six) hours as needed. J44.9 (Patient taking differently: Take 3 mLs by nebulization every 6 (six) hours as needed (shortness of breath). J44.9) 360 mL 3  . levothyroxine (SYNTHROID, LEVOTHROID) 200 MCG tablet Take 200 mcg by mouth at bedtime.     . lidocaine-prilocaine (EMLA) cream Apply 1 application topically as needed. 30 g 0  . losartan (COZAAR) 50 MG tablet Take 50 mg by mouth daily.    . methotrexate (RHEUMATREX) 2.5 MG tablet Take 15 mg by mouth once a week. Caution:Chemotherapy. Protect from light.    . mirtazapine (REMERON) 15 MG tablet Take 15 mg by mouth at bedtime.    . Omega-3 Fatty Acids (FISH OIL) 1200 MG CAPS Take 1,200 mg by mouth at bedtime. 360 MG OMEGA-3    . omeprazole (PRILOSEC) 40 MG capsule Take 40 mg by mouth at bedtime.     . predniSONE (DELTASONE) 10  MG tablet Take 10 mg by mouth daily with breakfast.    . prochlorperazine (COMPAZINE) 10 MG tablet Take 1 tablet (10 mg total) by mouth every 6 (six) hours as needed for nausea or vomiting. 30 tablet 0  . [START ON 08/29/2019] rivaroxaban (XARELTO) 20 MG TABS tablet Take 1 tablet (20 mg total) by mouth daily with supper. Take when the starter pack is completed. 30 tablet 3  . rOPINIRole (REQUIP) 0.5 MG tablet Take 0.5 mg by mouth at bedtime.    . ticagrelor (BRILINTA) 90 MG TABS tablet Take 1 tablet (90 mg total) by mouth 2 (two) times daily. 60 tablet 2  . Vitamin D, Ergocalciferol, (DRISDOL) 1.25 MG (50000 UT) CAPS capsule Take 50,000 Units by mouth every 7 (seven)  days.    . Fluticasone-Salmeterol (ADVAIR) 100-50 MCG/DOSE AEPB Inhale 1 puff into the lungs daily.    . ciprofloxacin (CIPRO) 250 MG tablet Take 250 mg by mouth 2 (two) times daily.     No facility-administered medications prior to visit.     Review of Systems  Review of Systems  Constitutional: Positive for appetite change and fever.  HENT: Negative.   Respiratory: Positive for cough, shortness of breath and wheezing.   Cardiovascular: Negative.   Neurological: Positive for weakness.     Physical Exam  BP 110/82   Pulse 78   Ht 6\' 1"  (1.854 m)   Wt 190 lb 9.6 oz (86.5 kg)   SpO2 98%   BMI 25.15 kg/m  Physical Exam Constitutional:      General: He is not in acute distress.    Appearance: Normal appearance. He is ill-appearing.     Comments: Chronically ill appearing   HENT:     Head: Normocephalic and atraumatic.     Nose: Nose normal.     Mouth/Throat:     Mouth: Mucous membranes are dry.     Pharynx: No oropharyngeal exudate.     Comments: Fissures and slight film to posterior tongue  Neck:     Musculoskeletal: Normal range of motion and neck supple.  Cardiovascular:     Rate and Rhythm: Normal rate and regular rhythm.  Pulmonary:     Effort: Pulmonary effort is normal. No respiratory distress.      Breath sounds: No stridor. No wheezing, rhonchi or rales.     Comments: Diminished right base  Musculoskeletal:     Comments: In WC, weak  Skin:    General: Skin is warm and dry.  Neurological:     General: No focal deficit present.     Mental Status: He is alert and oriented to person, place, and time. Mental status is at baseline.  Psychiatric:        Mood and Affect: Mood normal.        Behavior: Behavior normal.      Lab Results:  CBC    Component Value Date/Time   WBC 2.5 (L) 08/23/2019 1308   WBC 7.2 08/20/2019 2130   RBC 3.44 (L) 08/23/2019 1308   HGB 10.1 (L) 08/23/2019 1308   HCT 32.4 (L) 08/23/2019 1308   PLT 174 08/23/2019 1308   MCV 94.2 08/23/2019 1308   MCH 29.4 08/23/2019 1308   MCHC 31.2 08/23/2019 1308   RDW 16.7 (H) 08/23/2019 1308   LYMPHSABS 1.1 08/23/2019 1308   MONOABS 0.0 (L) 08/23/2019 1308   EOSABS 0.2 08/23/2019 1308   BASOSABS 0.0 08/23/2019 1308    BMET    Component Value Date/Time   NA 139 08/23/2019 1308   K 4.5 08/23/2019 1308   CL 104 08/23/2019 1308   CO2 27 08/23/2019 1308   GLUCOSE 107 (H) 08/23/2019 1308   BUN 25 (H) 08/23/2019 1308   CREATININE 1.23 08/23/2019 1308   CREATININE 1.79 (H) 10/12/2014 1136   CALCIUM 9.0 08/23/2019 1308   GFRNONAA 54 (L) 08/23/2019 1308   GFRNONAA 36 (L) 10/12/2014 1136   GFRAA >60 08/23/2019 1308   GFRAA 41 (L) 10/12/2014 1136    BNP    Component Value Date/Time   BNP 646.0 (H) 08/19/2019 1801    ProBNP No results found for: PROBNP  Imaging: Dg Chest 2 View  Result Date: 08/17/2019 CLINICAL DATA:  Pleural effusion EXAM: CHEST - 2 VIEW COMPARISON:  08/05/2019  FINDINGS: Prior median sternotomy. Right-sided chest port remains in place. Small caliber right pleural drainage catheter. Small loculated right sided pleural effusion with associated right basilar opacity is not significantly changed from prior. Left lung remains clear. No pneumothorax. IMPRESSION: Persistent loculated right-sided  pleural effusion, not significantly changed from prior. Electronically Signed   By: Davina Poke M.D.   On: 08/17/2019 14:50   Dg Chest 2 View  Result Date: 08/05/2019 CLINICAL DATA:  Shortness of breath and fatigue. EXAM: CHEST - 2 VIEW COMPARISON:  July 13, 2019 FINDINGS: The right Port-A-Cath is stable. A right-sided pleural effusion and underlying opacity is stable. No other interval changes. The cardiomediastinal silhouette is stable. IMPRESSION: The right-sided pleural effusion with underlying opacity is stable. No acute interval changes. Electronically Signed   By: Dorise Bullion III M.D   On: 08/05/2019 14:07   Ct Angio Chest Pe W/cm &/or Wo Cm  Result Date: 08/21/2019 CLINICAL DATA:  85 year old male with shortness of breath with concern for pulmonary embolism. EXAM: CT ANGIOGRAPHY CHEST WITH CONTRAST TECHNIQUE: Multidetector CT imaging of the chest was performed using the standard protocol during bolus administration of intravenous contrast. Multiplanar CT image reconstructions and MIPs were obtained to evaluate the vascular anatomy. CONTRAST:  132mL OMNIPAQUE IOHEXOL 350 MG/ML SOLN COMPARISON:  Chest CT dated 08/05/2019 FINDINGS: Cardiovascular: There is mild cardiomegaly. No pericardial effusion. Three-vessel coronary vascular calcification. Advanced calcified and noncalcified plaque of the thoracic aorta. Evaluation of the pulmonary arteries is limited due to respiratory motion artifact and suboptimal visualization of the distal branches. No large or central pulmonary artery embolus identified. Right-sided Port-A-Cath with tip close to the cavoatrial junction. Mediastinum/Nodes: No hilar or mediastinal adenopathy. The esophagus is grossly unremarkable. No mediastinal fluid collection. Lungs/Pleura: There is a small right pleural effusion with right lower lobe compressive atelectasis although pneumonia is not excluded. A pleural catheter is noted with tip in the medial right lung base  pleural surface. There is no pneumothorax. The central airways are patent. Upper Abdomen: Cirrhosis. The visualized upper abdomen is otherwise unremarkable. Musculoskeletal: Median sternotomy wires. Multilevel degenerative changes of the spine. No acute osseous pathology. Review of the MIP images confirms the above findings. IMPRESSION: 1. No CT evidence of central pulmonary artery embolus. 2. Small right pleural effusion with right lower lobe compressive atelectasis. Pneumonia is not excluded. 3. Cirrhosis. 4. Aortic Atherosclerosis (ICD10-I70.0). Electronically Signed   By: Anner Crete M.D.   On: 08/21/2019 01:26   Ct Angio Chest Pe W And/or Wo Contrast  Result Date: 08/05/2019 CLINICAL DATA:  Shortness of breath and fatigue since last night. PE suspected, high pretest probability. EXAM: CT ANGIOGRAPHY CHEST WITH CONTRAST TECHNIQUE: Multidetector CT imaging of the chest was performed using the standard protocol during bolus administration of intravenous contrast. Multiplanar CT image reconstructions and MIPs were obtained to evaluate the vascular anatomy. CONTRAST:  124mL OMNIPAQUE IOHEXOL 350 MG/ML SOLN COMPARISON:  Chest CT angiogram dated 05/23/2019. FINDINGS: Cardiovascular: Majority of the segmental and subsegmental pulmonary arteries cannot be definitively characterized due to patient breathing motion artifact, however, there is at least a single pulmonary embolism within a peripheral subsegmental branch to the posterior segment of the RIGHT lower lobe. Additional peripheral pulmonary emboli suspected. No central obstructing pulmonary embolism identified within the main or intralobar pulmonary arteries bilaterally. No thoracic aortic aneurysm or evidence of aortic dissection. Diffuse aortic atherosclerosis. Cardiomegaly. No pericardial effusion. Diffuse coronary artery calcifications. Status post median sternotomy for CABG. Mediastinum/Nodes: No mass or enlarged lymph nodes are  seen within the  mediastinum or perihilar regions. Esophagus is unremarkable.z trachea appears normal. Lungs/Pleura: Chronic atelectasis and pleural thickening at the RIGHT lung base. No evidence of pneumonia or pulmonary edema. No pleural effusion or pneumothorax seen. Upper Abdomen: No acute findings. Musculoskeletal: No acute or suspicious osseous findings. Degenerative spondylosis of the thoracic spine, mild to moderate in degree. Review of the MIP images confirms the above findings. IMPRESSION: 1. At least a single pulmonary embolism within a peripheral subsegmental branch to the posterior segment of the RIGHT lower lobe. Additional peripheral pulmonary emboli suspected, however, majority of the segmental and subsegmental pulmonary arteries cannot be definitively characterized due to patient breathing motion artifact. No central obstructing pulmonary embolism is identified within the main or intralobar pulmonary arteries bilaterally. 2. Cardiomegaly. No pericardial effusion. 3. Chronic atelectasis/pleural thickening at the RIGHT lung base. No evidence of pneumonia or pulmonary edema. No pleural effusion. Aortic Atherosclerosis (ICD10-I70.0). Electronically Signed   By: Franki Cabot M.D.   On: 08/05/2019 19:37   Dg Chest Port 1 View  Result Date: 08/20/2019 CLINICAL DATA:  Dyspnea EXAM: PORTABLE CHEST 1 VIEW COMPARISON:  August 19, 2019 FINDINGS: The right-sided Port-A-Cath is stable in positioning. There is a persistent small right-sided pleural effusion. No pneumothorax. There is an unchanged airspace opacity at the right lung base. The heart size is stable. The patient is status post prior median sternotomy. IMPRESSION: No significant interval change. Persistent right-sided pleural effusion. Electronically Signed   By: Constance Holster M.D.   On: 08/20/2019 23:09   Dg Chest Portable 1 View  Result Date: 08/19/2019 CLINICAL DATA:  Shortness of breath and cough for 2 weeks. This is worsened over the last 2 days.  EXAM: PORTABLE CHEST 1 VIEW COMPARISON:  08/17/2019 FINDINGS: Power injectable Port-A-Cath tip: SVC. Prior CABG. Moderate enlargement of the cardiopericardial silhouette with chronic blunting of the right lateral costophrenic angle and indistinct opacity along the right hemidiaphragm. There is likely still a pleural drainage catheter at the right lung base although this is difficult to see because of all of the superimposed ECG lead wires. Upper zone pulmonary vascular prominence probably reflects pulmonary venous hypertension and is increased from 08/17/2019. IMPRESSION: 1. Moderate enlargement of the cardiopericardial silhouette with continued small right pleural effusion and indistinct opacity at the right lung base. I suspect that the indwelling pleural drainage catheter still present although there many superimposed ECG lead wires in this vicinity making identification of the pleural drainage catheter difficult. 2. Increased cephalization of blood flow suggesting pulmonary venous hypertension. 3. Prior CABG. Electronically Signed   By: Van Clines M.D.   On: 08/19/2019 18:28   Vas [place] Lower Extremity Venous (dvt)  Result Date: 08/07/2019  Lower Venous Study Indications: Pulmonary embolism.  Comparison Study: 05/19/19 negative Performing Technologist: June Leap RDMS, RVT  Examination Guidelines: A complete evaluation includes B-mode imaging, spectral Doppler, color Doppler, and power Doppler as needed of all accessible portions of each vessel. Bilateral testing is considered an integral part of a complete examination. Limited examinations for reoccurring indications may be performed as noted.  +---------+---------------+---------+-----------+----------+-------+ RIGHT    CompressibilityPhasicitySpontaneityPropertiesSummary +---------+---------------+---------+-----------+----------+-------+ CFV      Full           Yes      Yes                           +---------+---------------+---------+-----------+----------+-------+ SFJ      Full                                                 +---------+---------------+---------+-----------+----------+-------+  FV Prox  Full                                                 +---------+---------------+---------+-----------+----------+-------+ FV Mid   Full                                                 +---------+---------------+---------+-----------+----------+-------+ FV DistalFull                                                 +---------+---------------+---------+-----------+----------+-------+ PFV      Full                                                 +---------+---------------+---------+-----------+----------+-------+ POP      Full           Yes      Yes                          +---------+---------------+---------+-----------+----------+-------+ PTV      Full                                                 +---------+---------------+---------+-----------+----------+-------+ PERO     Full                                                 +---------+---------------+---------+-----------+----------+-------+   +---------+---------------+---------+-----------+----------+-------+ LEFT     CompressibilityPhasicitySpontaneityPropertiesSummary +---------+---------------+---------+-----------+----------+-------+ CFV      Full           Yes      Yes                          +---------+---------------+---------+-----------+----------+-------+ SFJ      Full                                                 +---------+---------------+---------+-----------+----------+-------+ FV Prox  Full                                                 +---------+---------------+---------+-----------+----------+-------+ FV Mid   Full                                                 +---------+---------------+---------+-----------+----------+-------+ FV DistalFull                                                  +---------+---------------+---------+-----------+----------+-------+  PFV      Full                                                 +---------+---------------+---------+-----------+----------+-------+ POP      Full           Yes      Yes                          +---------+---------------+---------+-----------+----------+-------+ PTV      Full                                                 +---------+---------------+---------+-----------+----------+-------+ PERO     Full                                                 +---------+---------------+---------+-----------+----------+-------+     Summary: Right: There is no evidence of deep vein thrombosis in the lower extremity. A cystic structure is found in the popliteal fossa. Left: There is no evidence of deep vein thrombosis in the lower extremity. No cystic structure found in the popliteal fossa. Incidental arterial finding: Left CFA atypical plaque formation anterior wall- possibly ulcerative plaque. Left Pop A significant  focal plaque causing >50% stenosis.  *See table(s) above for measurements and observations. Electronically signed by Ruta Hinds MD on 08/07/2019 at 12:13:52 PM.    Final      Assessment & Plan:   COPD (chronic obstructive pulmonary disease) ([place]) - Hx COPD/asthma, no PFTs on file - Resume Advair 1 puff twice daily - Albuterol hfa/duoneb q6 hours AS NEEDED for shortness of breath/wheezing - Start mucinex twice daily for congestion - Use incentive spirometer 2-3 times a day  - Sputum culture if able   Pulmonary embolism ([place]) - Continues to have some shortness of breath and weakness. O2 saturation 98% RA - CTA 8/14 showed single pulmonary embolism within a peripheral subsegmental branch to the posterior segment of right lower lobe, additional peripheral PE suspected. - Started on Xarelto 15mg  twice daily x 3 weeks, then 20mg  daily   Malignant  mesothelioma of pleura (HCC) - Receiving chemotherapy every 3 weeks, last dose was on Thursday 08/18/19  Pleural effusion on right - Following with Dr. Nils Pyle office for recurrent right pleural effusion secondary to mesothelioma - Right pleurx catheter is being drained twice a week by his wife approx 150-261ml  Thrush - Nystatin QID  - Throat culture   Martyn Ehrich, NP 08/23/2019

## 2019-08-23 NOTE — Assessment & Plan Note (Addendum)
-   Continues to have some shortness of breath and weakness. O2 saturation 98% RA - CTA 8/14 showed single pulmonary embolism within a peripheral subsegmental branch to the posterior segment of right lower lobe, additional peripheral PE suspected. - Started on Xarelto 15mg  twice daily x 3 weeks, then 20mg  daily

## 2019-08-23 NOTE — Patient Instructions (Addendum)
You do have a pulmonary embolism which is going to cause some baseline shortness of breath for a little while until on anticoagulate for a few months   Recommendations: - Resume Adviar 1 puff twice daily- take in morning and evening EVERYDAY - Use albuterol rescue inhaler AS NEEDED for breahthrough shortness of breath/wheezing - Start mucinex twice daily for congestion - Use incentive spirometer 2-3 times a day  - Stay well hydrated and focus on good protein sources   RX: - Refilling Advair - 1 puff twice daily - Nystatin liquid - take x4 daily for thrush symptoms - Mucinex - take twice daily   Orders: - Throat culture - Sputum culture - if able

## 2019-08-24 ENCOUNTER — Other Ambulatory Visit: Payer: Self-pay | Admitting: *Deleted

## 2019-08-24 ENCOUNTER — Telehealth: Payer: Self-pay | Admitting: *Deleted

## 2019-08-24 ENCOUNTER — Telehealth: Payer: Self-pay | Admitting: Primary Care

## 2019-08-24 LAB — RESPIRATORY CULTURE OR RESPIRATORY AND SPUTUM CULTURE: MICRO NUMBER:: 836795

## 2019-08-24 NOTE — Telephone Encounter (Signed)
See 08-22-2019 Collaborative note.

## 2019-08-24 NOTE — Telephone Encounter (Signed)
Returned patient call upon connecting with [facility] provider. Informed Jeffrey [name] our provider connected with Jeffrey [name] 08-23-2019.  Our office urine results and PCP results are negative.  Per Jeffrey [name], Jeffrey [name] does not need IV antibiotics.  Contact PCP for further information.      "I have left a message there.  He drinks a lot but has pain and burning.  Does not drink water but lots of tea, coffee, milk, juice.  Glad we reached you.  We hate calling, leaving messages because no one calls [place] back.  Almost at a point to call administration.  I will tell him to drink water."

## 2019-08-24 NOTE — Telephone Encounter (Signed)
Per CMM, PA has been approved. Pharmacy is aware. Nothing further needed.

## 2019-08-24 NOTE — Telephone Encounter (Signed)
Medication name and strength: Advair 100-70mcg Provider: Eustaquio Maize  Pharmacy: [facility] in [place] Patient insurance ID:    Was the PA started on CMM?  Yes If yes, please enter the Key: [id number] Timeframe for approval/denial: 1-3 days

## 2019-08-24 NOTE — Telephone Encounter (Addendum)
"  Jeffrey [name]'S wife [name] [phone]).  He needs to get in for IV antibiotics per the on-call provider Sunday, 08-21-2019.  We saw Dr. Shearon Stalls in [place], [place]. Dr. Shearon Stalls has been trying to call office since Monday for him to receive IV atibiotics there.  He needs something done today." 11:54 am: "T = 99.1.  Last night he c/o pain and burning and not a lot comes out."

## 2019-08-30 ENCOUNTER — Telehealth: Payer: Self-pay | Admitting: Primary Care

## 2019-08-30 ENCOUNTER — Ambulatory Visit: Payer: Medicare Other | Admitting: Physician Assistant

## 2019-08-30 ENCOUNTER — Ambulatory Visit: Payer: Medicare Other

## 2019-08-30 ENCOUNTER — Inpatient Hospital Stay: Payer: Medicare Other

## 2019-08-30 ENCOUNTER — Other Ambulatory Visit: Payer: Self-pay

## 2019-08-30 DIAGNOSIS — C450 Mesothelioma of pleura: Secondary | ICD-10-CM

## 2019-08-30 LAB — CBC WITH DIFFERENTIAL (CANCER CENTER ONLY)
Abs Immature Granulocytes: 0.01 10*3/uL (ref 0.00–0.07)
Basophils Absolute: 0 10*3/uL (ref 0.0–0.1)
Basophils Relative: 0 %
Eosinophils Absolute: 0.7 10*3/uL — ABNORMAL HIGH (ref 0.0–0.5)
Eosinophils Relative: 25 %
HCT: 27.2 % — ABNORMAL LOW (ref 39.0–52.0)
Hemoglobin: 8.7 g/dL — ABNORMAL LOW (ref 13.0–17.0)
Immature Granulocytes: 0 %
Lymphocytes Relative: 44 %
Lymphs Abs: 1.2 10*3/uL (ref 0.7–4.0)
MCH: 29.5 pg (ref 26.0–34.0)
MCHC: 32 g/dL (ref 30.0–36.0)
MCV: 92.2 fL (ref 80.0–100.0)
Monocytes Absolute: 0.3 10*3/uL (ref 0.1–1.0)
Monocytes Relative: 10 %
Neutro Abs: 0.6 10*3/uL — ABNORMAL LOW (ref 1.7–7.7)
Neutrophils Relative %: 21 %
Platelet Count: 51 10*3/uL — ABNORMAL LOW (ref 150–400)
RBC: 2.95 MIL/uL — ABNORMAL LOW (ref 4.22–5.81)
RDW: 16.2 % — ABNORMAL HIGH (ref 11.5–15.5)
WBC Count: 2.8 10*3/uL — ABNORMAL LOW (ref 4.0–10.5)
nRBC: 0 % (ref 0.0–0.2)

## 2019-08-30 LAB — CMP (CANCER CENTER ONLY)
ALT: 25 U/L (ref 0–44)
AST: 20 U/L (ref 15–41)
Albumin: 3.1 g/dL — ABNORMAL LOW (ref 3.5–5.0)
Alkaline Phosphatase: 77 U/L (ref 38–126)
Anion gap: 8 (ref 5–15)
BUN: 12 mg/dL (ref 8–23)
CO2: 26 mmol/L (ref 22–32)
Calcium: 8.5 mg/dL — ABNORMAL LOW (ref 8.9–10.3)
Chloride: 109 mmol/L (ref 98–111)
Creatinine: 1.13 mg/dL (ref 0.61–1.24)
GFR, Est AFR Am: >60 mL/min (ref >60)
GFR, Estimated: 60 mL/min — ABNORMAL LOW (ref >60)
Glucose, Bld: 90 mg/dL (ref 70–99)
Potassium: 3.7 mmol/L (ref 3.5–5.1)
Sodium: 143 mmol/L (ref 135–145)
Total Bilirubin: <0.2 mg/dL — ABNORMAL LOW (ref 0.3–1.2)
Total Protein: 6.8 g/dL (ref 6.5–8.1)

## 2019-08-30 NOTE — Telephone Encounter (Signed)
LMTCB   Notes recorded by Martyn Ehrich, NP on 08/30/2019 at 9:06 AM EDT  Sputum sample was inadequate. Repeat if he is still coughing up mucus  ------

## 2019-08-30 NOTE — Progress Notes (Signed)
Sputum sample was inadequate. Repeat if he is still coughing up mucus

## 2019-09-01 ENCOUNTER — Telehealth: Payer: Self-pay | Admitting: *Deleted

## 2019-09-01 NOTE — Telephone Encounter (Signed)
TCT pt's wife this morning to check on pt status.  Labs from 2 days ago reveal neutropenia and low platelet count.  Pt is being treated for UTI with IV antibiotics per his PCP at [facility] in [place], [place]. Spoke with his wife. She states that pt is getting IV antibiotics for a total of 12 days.  He started on Monday or Tuesday.  Reviewed recent labs with her. Reviewed neutropenic precautions. And reviewed thrombocytopenia precautions. Pt has been having some blood in his urine and currently his Xarelto is on hold. Advised to continue holding until no blood in urine. Wife voiced understanding to to the above instructions.  Reviewed upcoming appts with her as well. Pt coming back to see Dr. Julien Nordmann on 09/06/19

## 2019-09-01 NOTE — Progress Notes (Addendum)
Cardiology Office Note   Date:  09/02/2019   ID:  Love, Milbourne December 16, 1934, MRN [id number]  PCP:  [name], MD  Cardiologist:  Dr. Radford Pax     Chief Complaint  Patient presents with  . Coronary Artery Disease      History of Present Illness: Jeffrey [name] is a 85 y.o. male who presents for CAD.    He has a hx of coronary artery disease status post non-STEMI in 2011 followed by CABG.  He underwent PCI to the LCx in 2012 and PCI to the LAD in 2013.  He was previously followed [facility] and then in [place].  Other history includes aortic stenosis, asthma, sleep apnea, Parkinson's, hypothyroidism, anxiety, ITP.  He was recently noted to have loculated pleural effusion and was admitted for thoracentesis then VATS with biopsy.  There was concern for mesothelioma.  Postop, he suffered a non-ST ovation myocardial infarction.  He was seen by Dr. Radford Pax for cardiology.  Cardiac catheterization demonstrated a patent LIMA-LAD and occluded vein graft to the OM and vein graft to the L PDA.  He had significant stenosis in the proximal LCx which was treated with a DES.  Dr. end performed his cardiac catheterization and intervention and recommended dual antiplatelet therapy for 3 months and then remain on Brilinta alone for an additional 9 months.  He had a right Pleurx catheter placed.  He has followed up with pulmonology and cardiothoracic surgery.  His effusion remains small.  Telehealth visit 06/07/19  Since discharge, he noted chest tightness and cough with taking a deep breath.  This symptom was present prior to his admission to the hospital and has not changed. Was stable.  Today on review he has had more issues. 08/05/19 he had SOB and had CT Angio of chest and dx of At least a single pulmonary embolism within a peripheral subsegmental branch to the posterior segment of the RIGHT lower lobe. Additional peripheral pulmonary emboli suspected, however, majority of the segmental and  subsegmental pulmonary arteries cannot be definitively characterized due to patient breathing motion artifact. No central obstructing pulmonary embolism is identified within the main or intralobar pulmonary arteries bilaterally.  Xarelto was added along with ASA and Brilinta.  Pt then developed UTI - started on Cipro and had allergic reaction.  He developed hematuria - he had another CTa of chest and no PE seen. With the hematuria and anemia recent hgb 8.7 and plts 51K ASA, Brilinta and ASA stopped per Dr. Julien Nordmann oncology. He is to see him on Tuesday.     Pt has no chest pain.  He does continue with dyspnea. On ER visits troponin have been neg at 13.  He has pleurx cath and it is drained twice a week. Saw Dr. Darcey Nora last week.  He is also followed by Dr. Halford Chessman with pulmonary.  No syncope or palpitations.    Echo 08/06/19 with decrease in EF to 45% basal and mid anterolateral wall is abnormal.     Past Medical History:  Diagnosis Date  . Anxiety   . Aortic stenosis    mild AS by 07/20/13 echo ([facility])  . Arthritis    "all over"  . Coronary artery disease    NSTEMI 06/2010, CABG x3=> [place]->LAD, SVG->OM1, SVG->PDA, DES LCx 10/2011, DES LAD 12/2011  . Depression   . GERD (gastroesophageal reflux disease)   . H/O hiatal hernia   . Hearing aid worn    B/L  .  High cholesterol   . History of blood transfusion reaction March 13, 2013   "he almost died; he has to get irradiated blood next time"  . Hypertension   . Hypothyroidism   . Ischemic cardiomyopathy   . ITP (idiopathic thrombocytopenic purpura)    Dr. Gaynelle Arabian, on [place]  . Myocardial infarction ([place]) 03-13-10  . Pneumonia 1940's X 1; 03/13/2014 X 1  . Rheumatoid arthritis ([place])   . Shortness of breath    with exertion, has not been very active  . Sleep apnea   . Wears glasses   . Wears partial dentures     Past Surgical History:  Procedure Laterality Date  . APPENDECTOMY    . BACK SURGERY    . CATARACT EXTRACTION,  BILATERAL    . CHEST TUBE INSERTION Right 05/20/2019   Procedure: INSERTION PLEURAL DRAINAGE CATHETER;  Surgeon: Ivin Poot, MD;  Location: [facility];  Service: Thoracic;  Laterality: Right;  . COLONOSCOPY    . CORONARY ANGIOPLASTY WITH STENT PLACEMENT     DES Lcx 10/2011, DES LAD 12/2011  . CORONARY ARTERY BYPASS GRAFT  03-13-10   "CABG X3"  . CORONARY STENT INTERVENTION N/A 05/26/2019   Procedure: CORONARY STENT INTERVENTION;  Surgeon: Nelva Bush, MD;  Location: [facility];  Service: Cardiovascular;  Laterality: N/A;  . GASTROC RECESSION EXTREMITY Right 07/06/2015   Pasty Spillers (orthopedics- [place], [place])  . HERNIA REPAIR    . HIATAL HERNIA REPAIR    . IR IMAGING GUIDED PORT INSERTION  06/29/2019  . KNEE ARTHROSCOPY Left 06/22/2014   w/I&D  . KNEE ARTHROSCOPY Left 06/22/2014   Procedure: IRRIGATION AND DEBRIDEMENT WITH CHRONDROPLASTY;  Surgeon: Hessie Dibble, MD;  Location: [facility];  Service: Orthopedics;  Laterality: Left;  . LEFT HEART CATH AND CORS/GRAFTS ANGIOGRAPHY N/A 05/26/2019   Procedure: LEFT HEART CATH AND CORS/GRAFTS ANGIOGRAPHY;  Surgeon: Nelva Bush, MD;  Location: [facility];  Service: Cardiovascular;  Laterality: N/A;  . LUMBAR DISC SURGERY  1960's?  . MULTIPLE TOOTH EXTRACTIONS    . PLEURAL BIOPSY Right 05/20/2019   Procedure: PLEURAL BIOPSY;  Surgeon: Ivin Poot, MD;  Location: [facility];  Service: Thoracic;  Laterality: Right;  . SHOULDER ARTHROSCOPY Right 04/09/2017   Procedure: ARTHROSCOPY SHOULDER;  Surgeon: Melrose Nakayama, MD;  Location: [facility];  Service: Orthopedics;  Laterality: Right;  . STERNAL CLOSURE     "wires from OHS taken out; plate put in" (05/23/5637)  . SYNOVECTOMY Left 08/17/2014   Procedure: SYNOVECTOMY LEFT KNEE;  Surgeon: Hessie Dibble, MD;  Location: [facility];  Service: Orthopedics;  Laterality: Left;  . TOTAL ANKLE ARTHROPLASTY Right 07/06/2015   Pasty Spillers (orthopedics- [facility])  . TOTAL HIP ARTHROPLASTY Right 06/29/2018    Procedure: RIGHT TOTAL HIP ARTHROPLASTY ANTERIOR APPROACH;  Surgeon: Melrose Nakayama, MD;  Location: [facility];  Service: Orthopedics;  Laterality: Right;  . TOTAL HIP ARTHROPLASTY Left 06/09/2016   Scott Streater Claiborne Billings (orthopedics- [facility] )  . TOTAL KNEE ARTHROPLASTY Left 11/24/2014   Procedure: TOTAL KNEE ARTHROPLASTY;  Surgeon: Hessie Dibble, MD;  Location: [facility];  Service: Orthopedics;  Laterality: Left;  [name] Kitchen VIDEO ASSISTED THORACOSCOPY Right 05/20/2019   Procedure: VIDEO ASSISTED THORACOSCOPY;  Surgeon: Ivin Poot, MD;  Location: [facility];  Service: Thoracic;  Laterality: Right;     Current Outpatient Medications  Medication Sig Dispense Refill  . albuterol (VENTOLIN HFA) 108 (90 Base) MCG/ACT inhaler Inhale 1 puff into the lungs 2 (two) times daily as needed  for shortness of breath.    . ALPRAZolam (XANAX) 0.25 MG tablet Take 1 tablet (0.25 mg total) by mouth 2 (two) times daily as needed for anxiety. 20 tablet 0  . azithromycin (ZITHROMAX Z-PAK) 250 MG tablet 500 mg PO Day 1 250 mg PO Day 2-5 (Patient taking differently: Take 250-500 mg by mouth See admin instructions. 500 mg PO Day 1 250 mg PO Day 2-5 Prescribed on 08/19/2019) 6 tablet 0  . benzonatate (TESSALON) 100 MG capsule Take 1 capsule (100 mg total) by mouth every 8 (eight) hours. 21 capsule 0  . dexamethasone (DECADRON) 4 MG tablet 4 mg p.o. twice daily the day before, day of and day after chemotherapy every 3 weeks 40 tablet 1  . folic acid (FOLVITE) 1 MG tablet Take 1 tablet (1 mg total) by mouth daily. 30 tablet 4  . guaiFENesin (MUCINEX) 600 MG 12 hr tablet Take 1 tablet (600 mg total) by mouth 2 (two) times daily. 60 tablet 1  . ipratropium-albuterol (DUONEB) 0.5-2.5 (3) MG/3ML SOLN Take 3 mLs by nebulization every 6 (six) hours as needed. J44.9 (Patient taking differently: Take 3 mLs by nebulization every 6 (six) hours as needed (shortness of breath). J44.9) 360 mL 3  . levothyroxine (SYNTHROID, LEVOTHROID) 200 MCG  tablet Take 200 mcg by mouth at bedtime.     . lidocaine-prilocaine (EMLA) cream Apply 1 application topically as needed. 30 g 0  . methotrexate (RHEUMATREX) 2.5 MG tablet Take 15 mg by mouth once a week. Caution:Chemotherapy. Protect from light.    . mirtazapine (REMERON) 15 MG tablet Take 15 mg by mouth at bedtime.    [name] Kitchen nystatin (MYCOSTATIN) 100000 UNIT/ML suspension Take 5 mLs (500,000 Units total) by mouth 4 (four) times daily for 14 days. 280 mL 0  . Omega-3 Fatty Acids (FISH OIL) 1200 MG CAPS Take 1,200 mg by mouth at bedtime. 360 MG OMEGA-3    . omeprazole (PRILOSEC) 40 MG capsule Take 40 mg by mouth at bedtime.     . predniSONE (DELTASONE) 10 MG tablet Take 10 mg by mouth daily with breakfast.    . prochlorperazine (COMPAZINE) 10 MG tablet Take 1 tablet (10 mg total) by mouth every 6 (six) hours as needed for nausea or vomiting. 30 tablet 0  . rOPINIRole (REQUIP) 0.5 MG tablet Take 0.5 mg by mouth at bedtime.    . Vitamin D, Ergocalciferol, (DRISDOL) 1.25 MG (50000 UT) CAPS capsule Take 50,000 Units by mouth every 7 (seven) days.     No current facility-administered medications for this visit.     Allergies:   Lipitor [atorvastatin], Methylprednisolone, Morphine and related, Other, Sulfa antibiotics, and Doxycycline    Social History:  The patient  reports that he quit smoking about 52 years ago. His smoking use included cigarettes. He has a 15.00 pack-year smoking history. He has quit using smokeless tobacco.  His smokeless tobacco use included chew. He reports that he does not drink alcohol or use drugs.   Family History:  The patient's family history includes Alzheimer's disease in his father; Arthritis in an other family member; Healthy in his son; Heart disease in his mother and another family member; Rheum arthritis in his brother and sister.    ROS:  General:no colds or fevers, + weight loss/gain mild Skin:no rashes or ulcers, abrasions from [employer]. HEENT:no blurred vision,  no congestion CV:see HPI PUL:see HPI GI:no diarrhea constipation or melena, no indigestion GU:+ hematuria, no dysuria MS:no joint pain, no claudication Neuro:no syncope,  no lightheadedness Endo:no diabetes, + thyroid disease  Wt Readings from Last 3 Encounters:  09/02/19 197 lb (89.4 kg)  08/23/19 190 lb 9.6 oz (86.5 kg)  08/20/19 200 lb (90.7 kg)     PHYSICAL EXAM: VS:  BP 130/67   Pulse 91   Temp 98 F (36.7 C)   Ht 6\' 1"  (1.854 m)   Wt 197 lb (89.4 kg)   SpO2 90%   BMI 25.99 kg/m  , BMI Body mass index is 25.99 kg/m. General:Pleasant affect, NAD Skin:Warm and dry, brisk capillary refill HEENT:normocephalic, sclera clear, mucus membranes moist Neck:supple, no JVD, no bruits  Heart:S1S2 RRR without murmur, gallup, rub or click Lungs:clear without rales, rhonchi, or wheezes OZD:GUYQ, non tender, + BS, do not palpate liver spleen or masses Ext:no lower ext edema, 2+ pedal pulses, 2+ radial pulses band-aids on Rt lateral knee and Lt lat knee Neuro:alert and oriented X 3, MAE, follows commands, + facial symmetry    EKG:  EKG is NOT ordered today. The ekg in ER without acute changes   Recent Labs: 08/06/2019: Magnesium 1.9; TSH 1.345 08/19/2019: B Natriuretic Peptide 646.0 08/30/2019: ALT 25; BUN 12; Creatinine 1.13; Hemoglobin 8.7; Platelet Count 51; Potassium 3.7; Sodium 143    Lipid Panel No results found for: CHOL, TRIG, HDL, CHOLHDL, VLDL, LDLCALC, LDLDIRECT     Other studies Reviewed: Additional studies/ records that were reviewed today include: . Cardiac cath 05/26/19 Conclusions: 1. Severe native coronary artery disease with occluded ostial LAD and proximal non-dominant RCA, as well as 90% ramus, 90% proximal LCx (possible ISR, though reported prior stents are not well-visualized), and CTO of OM1. 2. Widely patent LIMA-LAD. 3. Chronically occluded SVG-OM and SVG-LPDA. 4. Moderately elevated LVEDP. 5. Mild aortic stenosis (peak-to-peak gradient 15 mmHg) 6.  Successful PCI to proximal LCx using Synergy 2.75 x 20 mm DES, post-dilated to 3.1 mm with 0% residual stenosis and TIMI-3 flow.  Recommendations: 1. Dual antiplatelet therapy with aspirin and ticagrelor for 3 months, after which aspirin can be stopped and ticagrelor continued for at least 9 additional months (if tolerated from a bleeding standpoint). 2. Continue secondary prevention. 3. Continue bivalirudin infusion x 2 hours post-PCI, then stop (ticagrelor could not be given until end of the procedure due to concern for aspiration).  Remove right femoral artery sheath with manual compression 2 hours after discontinuation of bivalirudin. 4. If renal function and blood pressure allow, consider gentle diuresis beginning tomorrow, given moderately elevated LVEDP.  ECHO  08/06/19 IMPRESSIONS    1. Stage 1: stage1: Basal and mid anterolateral wall is abnormal.  2. The left ventricle has a visually estimated ejection fraction of 45%. The cavity size was normal. There is moderate concentric left ventricular hypertrophy. Left ventricular diastolic Doppler parameters are consistent with impaired relaxation.  Indeterminate filling pressures Left ventricular diffuse hypokinesis.  3. The right ventricle has normal systolic function. The cavity was normal. There is no increase in right ventricular wall thickness.  4. The mitral valve is degenerative. Mild thickening of the mitral valve leaflet. There is mild mitral annular calcification present.  5. The aortic valve was not well visualized. Aortic valve regurgitation is mild by color flow Doppler. Mild stenosis of the aortic valve.  6. The aorta is abnormal unless otherwise noted.  7. There is mild dilatation of the aortic root measuring 40 mm.  8. The interatrial septum appears to be lipomatous.  FINDINGS  Left Ventricle: The left ventricle has a visually estimated ejection fraction of 45.  The cavity size was normal. There is moderate concentric left  ventricular hypertrophy. Left ventricular diastolic Doppler parameters are consistent with impaired  relaxation. Indeterminate filling pressures Left ventricular diffuse hypokinesis.  LV Wall Scoring: The basal and mid anterolateral wall is hypokinetic.   Right Ventricle: The right ventricle has normal systolic function. The cavity was normal. There is no increase in right ventricular wall thickness.  Left Atrium: Left atrial size was normal in size.  Right Atrium: Right atrial size was normal in size. Right atrial pressure is estimated at 10 mmHg.  Interatrial Septum: No atrial level shunt detected by color flow Doppler. Increased thickness of the atrial septum sparing the fossa ovalis consistent with The interatrial septum appears to be lipomatous.  Pericardium: There is no evidence of pericardial effusion.  Mitral Valve: The mitral valve is degenerative in appearance. Mild thickening of the mitral valve leaflet. There is mild mitral annular calcification present. Mitral valve regurgitation is mild by color flow Doppler.  Tricuspid Valve: The tricuspid valve is not well visualized. Tricuspid valve regurgitation is trivial by color flow Doppler.  Aortic Valve: The aortic valve was not well visualized Aortic valve regurgitation is mild by color flow Doppler. There is Mild stenosis of the aortic valve, with a calculated valve area of 1.83 cm.  Pulmonic Valve: The pulmonic valve was not well visualized. Pulmonic valve regurgitation is not visualized by color flow Doppler.  Aorta: The aorta is abnormal unless otherwise noted. There is mild dilatation of the aortic root measuring 40 mm.  Venous: The inferior vena cava is normal in size with greater than 50% respiratory variability.     ASSESSMENT AND PLAN:  1.  CAD with hx CABG and NSTEMI in 05/2019 with cath and Successful PCI to proximal LCx using Synergy 2.75 x 20 mm DES, post-dilated to 3.1 mm with 0% residual stenosis  and TIMI-3 flow. LIMA to LAD patent, chronically occluded VG to OM and VG to LPDA.  Severe native CAD occluded ostial LAD and proximal non-dominant RCA, as well as 90% ramus, 90% proximal LCx (possible ISR, though reported prior stents are not well-visualized), and CTO of OM1 and Mild AS  Per Dr. Saunders Revel DAPT for 3 months - stop ASA at that time (sept 4) and conitnue Brilinta for 9 months unless bleeding.    Discussed with Dr. Jacinta Shoe DOD.  Will touch base with Dr. Saunders Revel as well.   --most likely if back on xarelto then stop asa and brilinta if not back on xarelto then continue Brilinta.  But will clarify with Dr. Saunders Revel.  Very difficult situation. --pt will need follow up in 3 months with Dr. Radford Pax or APP-  He has not yet seen Dr. Radford Pax but requested her.   2.  PE on CTA of chest 08/05/19 and on xarelto now stopped,  On CTA of chest 08/21/19 no PE seen.  xarelto stopped by Dr. Julien Nordmann - he follows up on Tuesday.  3.  Acute hematuria and UTI on abx.  Had allergic reaction to Cipro  4.  Anemia hgb to 8.7 and plts 51K  5.  Malignant epithelioid mesothelioma with VATS with talc pleurodesis and rt pleurX cath. Still with drainage.   6.  Change in EF decrease but on all recent troponin HS pk was 31 only.   He has increased dyspnea for last month.   7.  HLD not on statin due to intolerance      ADDENDUM 09/05/19   Discussed with Dr. Saunders Revel- who did  PCI "Sounds like Mr. Blancett has been through a lot since June. I guess the short answer about antiplatelet therapy is that it depends on what Dr. Julien Nordmann wants to do (and there is no absolutely right answer). If he needs to stay on Xarelto and does not have any further bleeding or thrombocytopenia, I would favor switching him to clopidogrel and continuing clopidogrel + Xarelto for up to a year. If bleeding/thrombocytopenia remain an issue, I would suggest monotherapy with Xarelto or aspirin (if he is not going to remain on long-term anticoagulation), given that he is  now >3 months out from his PCI. If any other questions come up, please let me know. "   ADDENDUM 09/05/19   Further discussion with Dr. Saunders Revel and Dr. Julien Nordmann " Let's do Xarelto + aspirin 81 mg daily. If platelets drop below 50,000 or hemoglobin decreases further, aspirin should be held and Xarelto monotherapy continued. Hopefully, CBC will be checked when the patient sees Dr. Julien Nordmann tomorrow. Otherwise, we should have him get one sometime this week"    Current medicines are reviewed with the patient today.  The patient Has no concerns regarding medicines.  The following changes have been made:  See above Labs/ tests ordered today include:see above  Disposition:   FU:  see above  Signed, Cecilie Kicks, NP  09/02/2019 9:02 PM    [facility] [place], [place], [place], [place] Phone: [phone]; Fax: [phone]

## 2019-09-01 NOTE — Telephone Encounter (Signed)
LMTCB x2 for pt's wife, Jeffrey [name].

## 2019-09-02 ENCOUNTER — Ambulatory Visit (INDEPENDENT_AMBULATORY_CARE_PROVIDER_SITE_OTHER): Payer: Medicare Other | Admitting: Cardiology

## 2019-09-02 ENCOUNTER — Other Ambulatory Visit: Payer: Self-pay

## 2019-09-02 ENCOUNTER — Encounter: Payer: Self-pay | Admitting: Cardiology

## 2019-09-02 VITALS — BP 130/67 | HR 91 | Temp 98.0°F | Ht 73.0 in | Wt 197.0 lb

## 2019-09-02 DIAGNOSIS — I25810 Atherosclerosis of coronary artery bypass graft(s) without angina pectoris: Secondary | ICD-10-CM

## 2019-09-02 DIAGNOSIS — D696 Thrombocytopenia, unspecified: Secondary | ICD-10-CM

## 2019-09-02 DIAGNOSIS — J90 Pleural effusion, not elsewhere classified: Secondary | ICD-10-CM

## 2019-09-02 DIAGNOSIS — I2583 Coronary atherosclerosis due to lipid rich plaque: Secondary | ICD-10-CM

## 2019-09-02 DIAGNOSIS — E785 Hyperlipidemia, unspecified: Secondary | ICD-10-CM | POA: Diagnosis not present

## 2019-09-02 DIAGNOSIS — I2693 Single subsegmental pulmonary embolism without acute cor pulmonale: Secondary | ICD-10-CM | POA: Diagnosis not present

## 2019-09-02 DIAGNOSIS — I2510 Atherosclerotic heart disease of native coronary artery without angina pectoris: Secondary | ICD-10-CM | POA: Diagnosis not present

## 2019-09-02 DIAGNOSIS — C450 Mesothelioma of pleura: Secondary | ICD-10-CM

## 2019-09-02 NOTE — Patient Instructions (Signed)
Medication Instructions:  Your physician recommends that you continue on your current medications as directed. Please refer to the Current Medication list given to you today.   Labwork: None today  Procedures/Testing: None today   Follow-Up: Dr Radford Pax or Physician Assistant at [facility] office in 2-3 months  Any Additional Special Instructions Will Be Listed Below (If Applicable).     If you need a refill on your cardiac medications before your next appointment, please call your pharmacy.      Thank you for choosing [facility] !

## 2019-09-05 NOTE — Telephone Encounter (Signed)
ATC pt's wife, there was no answer and I could not leave a message.

## 2019-09-06 ENCOUNTER — Inpatient Hospital Stay: Payer: Medicare Other

## 2019-09-06 ENCOUNTER — Inpatient Hospital Stay (HOSPITAL_BASED_OUTPATIENT_CLINIC_OR_DEPARTMENT_OTHER): Payer: Medicare Other | Admitting: Physician Assistant

## 2019-09-06 ENCOUNTER — Other Ambulatory Visit: Payer: Self-pay

## 2019-09-06 ENCOUNTER — Encounter: Payer: Self-pay | Admitting: Physician Assistant

## 2019-09-06 VITALS — BP 115/65 | HR 74 | Temp 98.0°F | Resp 18 | Ht 73.0 in | Wt 201.5 lb

## 2019-09-06 DIAGNOSIS — C450 Mesothelioma of pleura: Secondary | ICD-10-CM | POA: Diagnosis not present

## 2019-09-06 DIAGNOSIS — D709 Neutropenia, unspecified: Secondary | ICD-10-CM | POA: Diagnosis not present

## 2019-09-06 LAB — CBC WITH DIFFERENTIAL (CANCER CENTER ONLY)
Abs Immature Granulocytes: 0.06 10*3/uL (ref 0.00–0.07)
Basophils Absolute: 0 10*3/uL (ref 0.0–0.1)
Basophils Relative: 0 %
Eosinophils Absolute: 0 10*3/uL (ref 0.0–0.5)
Eosinophils Relative: 0 %
HCT: 26.4 % — ABNORMAL LOW (ref 39.0–52.0)
Hemoglobin: 8.2 g/dL — ABNORMAL LOW (ref 13.0–17.0)
Immature Granulocytes: 2 %
Lymphocytes Relative: 42 %
Lymphs Abs: 1.4 10*3/uL (ref 0.7–4.0)
MCH: 29.9 pg (ref 26.0–34.0)
MCHC: 31.1 g/dL (ref 30.0–36.0)
MCV: 96.4 fL (ref 80.0–100.0)
Monocytes Absolute: 1.1 10*3/uL — ABNORMAL HIGH (ref 0.1–1.0)
Monocytes Relative: 33 %
Neutro Abs: 0.8 10*3/uL — ABNORMAL LOW (ref 1.7–7.7)
Neutrophils Relative %: 23 %
Platelet Count: 356 10*3/uL (ref 150–400)
RBC: 2.74 MIL/uL — ABNORMAL LOW (ref 4.22–5.81)
RDW: 19.6 % — ABNORMAL HIGH (ref 11.5–15.5)
WBC Count: 3.3 10*3/uL — ABNORMAL LOW (ref 4.0–10.5)
nRBC: 0 % (ref 0.0–0.2)

## 2019-09-06 LAB — CMP (CANCER CENTER ONLY)
ALT: 11 U/L (ref 0–44)
AST: 15 U/L (ref 15–41)
Albumin: 3 g/dL — ABNORMAL LOW (ref 3.5–5.0)
Alkaline Phosphatase: 64 U/L (ref 38–126)
Anion gap: 9 (ref 5–15)
BUN: 13 mg/dL (ref 8–23)
CO2: 25 mmol/L (ref 22–32)
Calcium: 8.7 mg/dL — ABNORMAL LOW (ref 8.9–10.3)
Chloride: 109 mmol/L (ref 98–111)
Creatinine: 1.02 mg/dL (ref 0.61–1.24)
GFR, Est AFR Am: >60 mL/min (ref >60)
GFR, Estimated: >60 mL/min (ref >60)
Glucose, Bld: 136 mg/dL — ABNORMAL HIGH (ref 70–99)
Potassium: 4.6 mmol/L (ref 3.5–5.1)
Sodium: 143 mmol/L (ref 135–145)
Total Bilirubin: <0.2 mg/dL — ABNORMAL LOW (ref 0.3–1.2)
Total Protein: 6.6 g/dL (ref 6.5–8.1)

## 2019-09-06 MED ORDER — FILGRASTIM-AAFI 480 MCG/0.8ML IJ SOSY: 480.0000 ug | PREFILLED_SYRINGE | Freq: Once | INTRAMUSCULAR | Status: DC

## 2019-09-06 MED ORDER — FILGRASTIM-SNDZ 480 MCG/0.8ML IJ SOSY
480.0000 ug | PREFILLED_SYRINGE | Freq: Once | INTRAMUSCULAR | Status: DC
  Filled 2019-09-06: qty 0.8

## 2019-09-06 NOTE — Progress Notes (Signed)
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  PRIOR THERAPY: none  CURRENT THERAPY: Systemic chemotherapy with carboplatin for AUC of 5 and Alimta 500 mg/M2 every 3 weeks. Status post 3 cycles  INTERVAL HISTORY: Jeffrey [name] 85 y.o. male returns to the clinic today for a follow-up visit. The patient is feeling fair today without any concerning complaints except for his baseline shortness of breath with exertion. The patient recently had a UTI which grew Pseudomonas on his urine culture.  The patient had some intolerance to Cipro and did not complete his antibiotic dose. The other antibiotics that the bacteria was sensitive to were all an IV formulation.  The patient is currently receiving IV antibiotics in [facility] for this concern which was arranged by his primary care provider.  The patient states that his last dose is scheduled for this week on Thursday.  The patient is unsure which antibiotic he is receiving for this complaint.  Otherwise the patient is feeling fairly well.  He denies any fever, chills, night sweats, or weight loss since his last visit.  He reports his baseline shortness of breath with exertion.  He denies any chest pain or hemoptysis.  He denies any current nausea, vomiting,  diarrhea, or constipation.  He denies any headache or visual changes.  He denies any rashes or skin changes. He is here today for evaluation before proceeding with cycle #4 today as scheduled.      MEDICAL HISTORY: Past Medical History:  Diagnosis Date  . Anxiety   . Aortic stenosis    mild AS by 07/20/13 echo ([facility])  . Arthritis    "all over"  . Coronary artery disease    NSTEMI 06/2010, CABG x3=>  [place]->LAD, SVG->OM1, SVG->PDA, DES LCx 10/2011, DES LAD 12/2011  . Depression   . GERD (gastroesophageal reflux disease)   . H/O hiatal hernia   . Hearing aid worn    B/L  . High cholesterol   . History of blood transfusion reaction 02-28-2013   "he almost died; he has to get irradiated blood next time"  . Hypertension   . Hypothyroidism   . Ischemic cardiomyopathy   . ITP (idiopathic thrombocytopenic purpura)    Dr. Gaynelle Arabian, on [place]  . Myocardial infarction ([place]) 02-28-10  . Pneumonia 1940's X 1; 02/28/14 X 1  . Rheumatoid arthritis ([place])   . Shortness of breath    with exertion, has not been very active  . Sleep apnea   . Wears glasses   . Wears partial dentures     ALLERGIES:  is allergic to lipitor [atorvastatin]; methylprednisolone; morphine and related; other; sulfa antibiotics; ciprofloxacin; and doxycycline.  MEDICATIONS:  Current Outpatient Medications  Medication Sig Dispense Refill  . albuterol (VENTOLIN HFA) 108 (90 Base) MCG/ACT inhaler Inhale 1 puff into the lungs 2 (two) times daily as needed for shortness of breath.    . ALPRAZolam (XANAX) 0.25 MG tablet Take 1 tablet (0.25 mg total) by mouth 2 (two) times daily as needed for anxiety. 20 tablet 0  . azithromycin (ZITHROMAX Z-PAK) 250 MG tablet 500 mg PO Day 1 250 mg PO Day 2-5 (Patient taking differently: Take 250-500 mg by mouth See admin instructions. 500 mg PO Day 1 250 mg PO Day 2-5 Prescribed on 08/19/2019) 6  tablet 0  . benzonatate (TESSALON) 100 MG capsule Take 1 capsule (100 mg total) by mouth every 8 (eight) hours. 21 capsule 0  . dexamethasone (DECADRON) 4 MG tablet 4 mg p.o. twice daily the day before, day of and day after chemotherapy every 3 weeks 40 tablet 1  . folic acid (FOLVITE) 1 MG tablet Take 1 tablet (1 mg total) by mouth daily. 30 tablet 4  . guaiFENesin (MUCINEX) 600 MG 12 hr tablet Take 1 tablet (600 mg total) by mouth 2 (two) times daily. 60 tablet 1  . ipratropium-albuterol (DUONEB) 0.5-2.5 (3) MG/3ML  SOLN Take 3 mLs by nebulization every 6 (six) hours as needed. J44.9 (Patient taking differently: Take 3 mLs by nebulization every 6 (six) hours as needed (shortness of breath). J44.9) 360 mL 3  . levothyroxine (SYNTHROID, LEVOTHROID) 200 MCG tablet Take 200 mcg by mouth at bedtime.     . lidocaine-prilocaine (EMLA) cream Apply 1 application topically as needed. 30 g 0  . methotrexate (RHEUMATREX) 2.5 MG tablet Take 15 mg by mouth once a week. Caution:Chemotherapy. Protect from light.    . mirtazapine (REMERON) 15 MG tablet Take 15 mg by mouth at bedtime.    [name] Kitchen nystatin (MYCOSTATIN) 100000 UNIT/ML suspension Take 5 mLs (500,000 Units total) by mouth 4 (four) times daily for 14 days. 280 mL 0  . Omega-3 Fatty Acids (FISH OIL) 1200 MG CAPS Take 1,200 mg by mouth at bedtime. 360 MG OMEGA-3    . omeprazole (PRILOSEC) 40 MG capsule Take 40 mg by mouth at bedtime.     . predniSONE (DELTASONE) 10 MG tablet Take 10 mg by mouth daily with breakfast.    . prochlorperazine (COMPAZINE) 10 MG tablet Take 1 tablet (10 mg total) by mouth every 6 (six) hours as needed for nausea or vomiting. 30 tablet 0  . rOPINIRole (REQUIP) 0.5 MG tablet Take 0.5 mg by mouth at bedtime.    . Vitamin D, Ergocalciferol, (DRISDOL) 1.25 MG (50000 UT) CAPS capsule Take 50,000 Units by mouth every 7 (seven) days.     No current facility-administered medications for this visit.    Facility-Administered Medications Ordered in Other Visits  Medication Dose Route Frequency Provider Last Rate Last Dose  . filgrastim-sndz (ZARXIO) injection 480 mcg  480 mcg Subcutaneous Once Jeffrey Bears, MD        SURGICAL HISTORY:  Past Surgical History:  Procedure Laterality Date  . APPENDECTOMY    . BACK SURGERY    . CATARACT EXTRACTION, BILATERAL    . CHEST TUBE INSERTION Right 05/20/2019   Procedure: INSERTION PLEURAL DRAINAGE CATHETER;  Surgeon: Ivin Poot, MD;  Location: [facility];  Service: Thoracic;  Laterality: Right;  . COLONOSCOPY     . CORONARY ANGIOPLASTY WITH STENT PLACEMENT     DES Lcx 10/2011, DES LAD 12/2011  . CORONARY ARTERY BYPASS GRAFT  2011   "CABG X3"  . CORONARY STENT INTERVENTION N/A 05/26/2019   Procedure: CORONARY STENT INTERVENTION;  Surgeon: Nelva Bush, MD;  Location: [facility];  Service: Cardiovascular;  Laterality: N/A;  . GASTROC RECESSION EXTREMITY Right 07/06/2015   Pasty Spillers (orthopedics- [place], [place])  . HERNIA REPAIR    . HIATAL HERNIA REPAIR    . IR IMAGING GUIDED PORT INSERTION  06/29/2019  . KNEE ARTHROSCOPY Left 06/22/2014   w/I&D  . KNEE ARTHROSCOPY Left 06/22/2014   Procedure: IRRIGATION AND DEBRIDEMENT WITH CHRONDROPLASTY;  Surgeon: Hessie Dibble, MD;  Location: [facility];  Service: Orthopedics;  Laterality: Left;  . LEFT HEART CATH AND CORS/GRAFTS ANGIOGRAPHY N/A 05/26/2019   Procedure: LEFT HEART CATH AND CORS/GRAFTS ANGIOGRAPHY;  Surgeon: Nelva Bush, MD;  Location: [facility];  Service: Cardiovascular;  Laterality: N/A;  . LUMBAR DISC SURGERY  1960's?  . MULTIPLE TOOTH EXTRACTIONS    . PLEURAL BIOPSY Right 05/20/2019   Procedure: PLEURAL BIOPSY;  Surgeon: Ivin Poot, MD;  Location: [facility];  Service: Thoracic;  Laterality: Right;  . SHOULDER ARTHROSCOPY Right 04/09/2017   Procedure: ARTHROSCOPY SHOULDER;  Surgeon: Melrose Nakayama, MD;  Location: [facility];  Service: Orthopedics;  Laterality: Right;  . STERNAL CLOSURE     "wires from OHS taken out; plate put in" (4/0/0867)  . SYNOVECTOMY Left 08/17/2014   Procedure: SYNOVECTOMY LEFT KNEE;  Surgeon: Hessie Dibble, MD;  Location: [facility];  Service: Orthopedics;  Laterality: Left;  . TOTAL ANKLE ARTHROPLASTY Right 07/06/2015   Pasty Spillers (orthopedics- [facility])  . TOTAL HIP ARTHROPLASTY Right 06/29/2018   Procedure: RIGHT TOTAL HIP ARTHROPLASTY ANTERIOR APPROACH;  Surgeon: Melrose Nakayama, MD;  Location: [facility];  Service: Orthopedics;  Laterality: Right;  . TOTAL HIP ARTHROPLASTY Left 06/09/2016   Scott Streater  Claiborne Billings (orthopedics- [place])  . TOTAL KNEE ARTHROPLASTY Left 11/24/2014   Procedure: TOTAL KNEE ARTHROPLASTY;  Surgeon: Hessie Dibble, MD;  Location: [facility];  Service: Orthopedics;  Laterality: Left;  [name] Kitchen VIDEO ASSISTED THORACOSCOPY Right 05/20/2019   Procedure: VIDEO ASSISTED THORACOSCOPY;  Surgeon: Ivin Poot, MD;  Location: [facility];  Service: Thoracic;  Laterality: Right;    REVIEW OF SYSTEMS:   Review of Systems  Constitutional: Positive for decreased appetite and fatigue. Negative for chills, fever and unexpected weight change.  HENT: Negative for mouth sores, nosebleeds, sore throat and trouble swallowing.   Eyes: Negative for eye problems and icterus.  Respiratory: Positive for baseline shortness of breath and cough. Negative for hemoptysis and wheezing.   Cardiovascular: Negative for chest pain and leg swelling.  Gastrointestinal: Negative for abdominal pain, constipation, diarrhea, nausea and vomiting.  Genitourinary: Negative for bladder incontinence, difficulty urinating, dysuria, frequency and hematuria.   Musculoskeletal: Negative for back pain, gait problem, neck pain and neck stiffness.  Skin: Negative for itching and rash.  Neurological: Positive for resting tremor and generalized weakness. Negative for dizziness, extremity weakness, gait problem, headaches, light-headedness and seizures.  Hematological: Negative for adenopathy. Does not bruise/bleed easily.  Psychiatric/Behavioral: Negative for confusion, depression and sleep disturbance. The patient is not nervous/anxious.     PHYSICAL EXAMINATION:  Blood pressure 115/65, pulse 74, temperature 98 F (36.7 C), temperature source Temporal, resp. rate 18, height _0  (1.854 m), weight 201 lb 8 oz (91.4 kg), SpO2 100 %.  ECOG PERFORMANCE STATUS: 2 - Symptomatic, <50% confined to bed  Physical Exam  Constitutional: Oriented to person, place, and time and well-developed, well-nourished, and in no distress. No distress.   HENT:  Head: Normocephalic and atraumatic.  Mouth/Throat: Oropharynx is clear and moist. No oropharyngeal exudate.  Eyes: Conjunctivae are normal. Right eye exhibits no discharge. Left eye exhibits no discharge. No scleral icterus.  Neck: Normal range of motion. Neck supple.  Cardiovascular: Systolic murmur noted on exam. Normal rate, regular rhythm. Intact distal pulses.   Pulmonary/Chest: Effort normal. Decreased breath sounds in lower right lobe. No respiratory distress. No wheezes. No rales.  Abdominal: Soft. Bowel sounds are normal. Exhibits no distension and no mass. There is no tenderness.  Musculoskeletal: Normal range of motion. Exhibits no edema.  Lymphadenopathy:    No cervical adenopathy.  Neurological: Resting tremor noted. Alert and oriented to person, place, and time. Exhibits normal muscle tone.  Skin: Skin is warm and dry. No rash noted. Not diaphoretic. No erythema. No pallor.  Psychiatric: Mood, memory and judgment normal.  Vitals reviewed.  LABORATORY DATA: Lab Results  Component Value Date   WBC 3.3 (L) 09/06/2019   HGB 8.2 (L) 09/06/2019   HCT 26.4 (L) 09/06/2019   MCV 96.4 09/06/2019   PLT 356 09/06/2019      Chemistry      Component Value Date/Time   NA 143 09/06/2019 0947   K 4.6 09/06/2019 0947   CL 109 09/06/2019 0947   CO2 25 09/06/2019 0947   BUN 13 09/06/2019 0947   CREATININE 1.02 09/06/2019 0947   CREATININE 1.79 (H) 10/12/2014 1136      Component Value Date/Time   CALCIUM 8.7 (L) 09/06/2019 0947   ALKPHOS 64 09/06/2019 0947   AST 15 09/06/2019 0947   ALT 11 09/06/2019 0947   BILITOT <0.2 (L) 09/06/2019 0947       RADIOGRAPHIC STUDIES:  Dg Chest 2 View  Result Date: 08/17/2019 CLINICAL DATA:  Pleural effusion EXAM: CHEST - 2 VIEW COMPARISON:  08/05/2019 FINDINGS: Prior median sternotomy. Right-sided chest port remains in place. Small caliber right pleural drainage catheter. Small loculated right sided pleural effusion with  associated right basilar opacity is not significantly changed from prior. Left lung remains clear. No pneumothorax. IMPRESSION: Persistent loculated right-sided pleural effusion, not significantly changed from prior. Electronically Signed   By: Davina Poke M.D.   On: 08/17/2019 14:50   Ct Angio Chest Pe W/cm &/or Wo Cm  Result Date: 08/21/2019 CLINICAL DATA:  85 year old male with shortness of breath with concern for pulmonary embolism. EXAM: CT ANGIOGRAPHY CHEST WITH CONTRAST TECHNIQUE: Multidetector CT imaging of the chest was performed using the standard protocol during bolus administration of intravenous contrast. Multiplanar CT image reconstructions and MIPs were obtained to evaluate the vascular anatomy. CONTRAST:  170m OMNIPAQUE IOHEXOL 350 MG/ML SOLN COMPARISON:  Chest CT dated 08/05/2019 FINDINGS: Cardiovascular: There is mild cardiomegaly. No pericardial effusion. Three-vessel coronary vascular calcification. Advanced calcified and noncalcified plaque of the thoracic aorta. Evaluation of the pulmonary arteries is limited due to respiratory motion artifact and suboptimal visualization of the distal branches. No large or central pulmonary artery embolus identified. Right-sided Port-A-Cath with tip close to the cavoatrial junction. Mediastinum/Nodes: No hilar or mediastinal adenopathy. The esophagus is grossly unremarkable. No mediastinal fluid collection. Lungs/Pleura: There is a small right pleural effusion with right lower lobe compressive atelectasis although pneumonia is not excluded. A pleural catheter is noted with tip in the medial right lung base pleural surface. There is no pneumothorax. The central airways are patent. Upper Abdomen: Cirrhosis. The visualized upper abdomen is otherwise unremarkable. Musculoskeletal: Median sternotomy wires. Multilevel degenerative changes of the spine. No acute osseous pathology. Review of the MIP images confirms the above findings. IMPRESSION: 1. No CT  evidence of central pulmonary artery embolus. 2. Small right pleural effusion with right lower lobe compressive atelectasis. Pneumonia is not excluded. 3. Cirrhosis. 4. Aortic Atherosclerosis (ICD10-I70.0). Electronically Signed   By: AAnner CreteM.D.   On: 08/21/2019 01:26   Dg Chest Port 1 View  Result Date: 08/20/2019 CLINICAL DATA:  Dyspnea EXAM: PORTABLE CHEST 1 VIEW COMPARISON:  August 19, 2019 FINDINGS: The right-sided Port-A-Cath is stable in positioning. There is a persistent small right-sided pleural effusion. No pneumothorax. There is an  unchanged airspace opacity at the right lung base. The heart size is stable. The patient is status post prior median sternotomy. IMPRESSION: No significant interval change. Persistent right-sided pleural effusion. Electronically Signed   By: Constance Holster M.D.   On: 08/20/2019 23:09   Dg Chest Portable 1 View  Result Date: 08/19/2019 CLINICAL DATA:  Shortness of breath and cough for 2 weeks. This is worsened over the last 2 days. EXAM: PORTABLE CHEST 1 VIEW COMPARISON:  08/17/2019 FINDINGS: Power injectable Port-A-Cath tip: SVC. Prior CABG. Moderate enlargement of the cardiopericardial silhouette with chronic blunting of the right lateral costophrenic angle and indistinct opacity along the right hemidiaphragm. There is likely still a pleural drainage catheter at the right lung base although this is difficult to see because of all of the superimposed ECG lead wires. Upper zone pulmonary vascular prominence probably reflects pulmonary venous hypertension and is increased from 08/17/2019. IMPRESSION: 1. Moderate enlargement of the cardiopericardial silhouette with continued small right pleural effusion and indistinct opacity at the right lung base. I suspect that the indwelling pleural drainage catheter still present although there many superimposed ECG lead wires in this vicinity making identification of the pleural drainage catheter difficult. 2.  Increased cephalization of blood flow suggesting pulmonary venous hypertension. 3. Prior CABG. Electronically Signed   By: Van Clines M.D.   On: 08/19/2019 18:28     ASSESSMENT/PLAN:  This is a very pleasant 85 year old Caucasian male diagnosed with malignant pleural mesothelioma, epitheloid type involving the right hemithorax.  He was diagnosed in May 2020.    The patient is on systemic chemotherapy with carboplatin for an AUC of 5 and Alimta 500 mg/m.  He is status post 3 cycles. The patient had been tolerating treatment well except for hiccups and fatigue.   The patient is currently undergoing IV antibiotics for a UTI in which his culture grew pseudomonas aeruginosa. The patient was unable to tolerate ciprofloxacin due to weakness and nausea. The patient's PCP arrange for the patient to receive IV antibiotics. The patient is unsure what antibiotics he is receiving but he is scheduled to receive his last dose on 09/08/2019 per patient reports.   The patient was seen with Dr. Julien Nordmann today. Labs were reviewed. The patient's labs today demonstrate neutropenia. The patient will receive two daily doses of granix. Due to issues with pending authorization, he will return to the clinic tomorrow to receive his first injection.   We will continue to monitor the patient's hemoglobin on routine labs and will transfuse if necessary. We will recheck his labs next week.   We will delay his chemotherapy by 1 week. We will see him back for a follow up visit in 1 week for evaluation prior to proceeding with cycle #4.   Since the patient recently had a CT angiogram x2 performed in the emergency room, we will not schedule a restaging CT scan at this time.   Dr. Julien Nordmann recently spoke to the patient's cardiologist to discuss his medications with aspirin, plavix, and Xarelto. It was reiterated that the patient is to discontinue his plavix. He is to continue taking a 81 mg aspirin as well as his 20 mg of  Xarelto. If the patient has any significant bleeding, the patient knows to seek medical evaluation.   The patient met with the cancer center nutritionist today to discuss strategies for his weight loss.   The patient was advised to call immediately if he has any concerning symptoms in the interval. The patient voices understanding  of current disease status and treatment options and is in agreement with the current care plan. All questions were answered. The patient knows to call the clinic with any problems, questions or concerns. We can certainly see the patient much sooner if necessary   Orders Placed This Encounter  Procedures  . Sample to Blood Bank    Standing Status:   Future    Standing Expiration Date:   09/05/2020     Tobe Sos Anaijah Augsburger, PA-C 09/06/19  ADDENDUM: Hematology/Oncology Attending: I had a face-to-face encounter with the patient today.  I recommended his care plan.  This is a very pleasant 85 years old white male with malignant pleural mesothelioma and he is currently undergoing systemic chemotherapy with carboplatin, and Alimta status post 3 cycles.  The patient has been tolerating this treatment well except for pancytopenia including leukocytopenia and neutropenia as well as thrombocytopenia. He has been treated recently for urinary tract infection with IV antibiotics under the care of his primary care physician. He also has few episodes of hematuria. He was supposed to start cycle #4 today but absolute neutrophil count is low.  I recommended for the patient to delay his treatment until next week.  We will arrange for the patient to receive Granix 480 mcg subcutaneously for the next 2 days. For the history of hematuria, he will discontinue his current treatment with Plavix and continue only with Xarelto and aspirin.  We may also consider discontinuing aspirin if the patient has any further bleeding issues.  This was done in coordination with his cardiologist. The  patient will come back for follow-up visit next week for evaluation before resuming his treatment. He was advised to call immediately if he has any concerning symptoms in the interval.  Disclaimer: This note was dictated with voice recognition software. Similar sounding words can inadvertently be transcribed and may be missed upon review. Jeffrey Kempf, MD 09/06/19

## 2019-09-06 NOTE — Telephone Encounter (Signed)
lmtcb for pt on both numbers.

## 2019-09-06 NOTE — Patient Instructions (Signed)

## 2019-09-06 NOTE — Patient Instructions (Signed)
-  His white blood cells (the infection fighting cells) are too low to get treatment today. WE will give him a shot in the clinic today to boost his white blood cell count. He will need to return tomorrow to receive one more injection.  -We will reschedule his treatment for next week. We will see him then to make sure his labs are acceptable for treatment -Please start taking a baby aspirin 81 mg and Xarelto 20 mg again. Do not take Plavix. Please call us if he needs a refill of the Xarelto. If he has any bleeding please call.

## 2019-09-06 NOTE — Progress Notes (Signed)
Nutrition Assessment   Reason for Assessment:  Patient identified on Malnutrition Screening report for poor appetite and weight loss.    ASSESSMENT: 85 year old male with malignant pleural mesothelioma.  Past medical history of CAD, PD, MI, COPD, HTN, pleurx cath, GERD, asthma, sleep apnea. Noted hospital admission in August 2020  Patient's infusion cancelled for today.  Met with patient prior to injection.  Patient reports that he is not on any special diet and he does not need a Dietitian.  Reports that he eats mostly soups (brunswick stew, potato and oyster soup).  Drinks ensure as well.      Nutrition Focused Physical Exam: deferred   Medications: reviewed   Labs: reviewed   Anthropometrics:   Height: 73 inches Weight: 201 lb 8 oz today increased from 197 lb on 9/11 Noted 210 lb on 6/9. Noted on 12/02/18 wt of 232 lb  BMI: 25  13% weight loss in the last 9 months  Calories:  2275-2700 Protein: 109-136 g Fluid: > 2.2 L   NUTRITION DIAGNOSIS: Increased nutrient needs related to cancer and cancer related treatments as evidenced by estimated needs  INTERVENTION:  Encouraged patient to continue to drink oral nutrition supplements for added nutrition Discussed briefly importance of high calorie, high protein foods and overall good nutrition. Contact information provided and encouraged patient to reach out if needed   MONITORING, EVALUATION, GOAL: Patient will consume adequate calories and protein to prevent weight loss   Next Visit: patient to contact as needed  Jilliane Kazanjian B. Zenia Resides, [place], [place] Registered Dietitian [phone] (pager)

## 2019-09-06 NOTE — Progress Notes (Signed)
Pt here today to receive zarxio 480 mcg. Pri authorization pending. Pt waited to 1230 for injection and left. Pt kknows to call tomorrow about injection. Hopefully authorization will be complete.

## 2019-09-07 ENCOUNTER — Other Ambulatory Visit: Payer: Self-pay

## 2019-09-07 ENCOUNTER — Telehealth: Payer: Self-pay | Admitting: Physician Assistant

## 2019-09-07 ENCOUNTER — Ambulatory Visit (INDEPENDENT_AMBULATORY_CARE_PROVIDER_SITE_OTHER): Payer: Medicare Other | Admitting: Primary Care

## 2019-09-07 ENCOUNTER — Ambulatory Visit (INDEPENDENT_AMBULATORY_CARE_PROVIDER_SITE_OTHER): Payer: Medicare Other

## 2019-09-07 ENCOUNTER — Inpatient Hospital Stay: Payer: Medicare Other

## 2019-09-07 ENCOUNTER — Encounter: Payer: Self-pay | Admitting: Primary Care

## 2019-09-07 VITALS — BP 121/74 | HR 79 | Temp 98.2°F | Resp 16

## 2019-09-07 VITALS — BP 130/60 | HR 78 | Ht 72.0 in | Wt 207.0 lb

## 2019-09-07 DIAGNOSIS — R0602 Shortness of breath: Secondary | ICD-10-CM | POA: Diagnosis not present

## 2019-09-07 DIAGNOSIS — C450 Mesothelioma of pleura: Secondary | ICD-10-CM

## 2019-09-07 DIAGNOSIS — J90 Pleural effusion, not elsewhere classified: Secondary | ICD-10-CM

## 2019-09-07 DIAGNOSIS — D709 Neutropenia, unspecified: Secondary | ICD-10-CM

## 2019-09-07 DIAGNOSIS — J410 Simple chronic bronchitis: Secondary | ICD-10-CM | POA: Diagnosis not present

## 2019-09-07 MED ORDER — FILGRASTIM-SNDZ 480 MCG/0.8ML IJ SOSY
480.0000 ug | PREFILLED_SYRINGE | Freq: Once | INTRAMUSCULAR | Status: AC
  Administered 2019-09-07: 12:00:00 480 ug via SUBCUTANEOUS
  Filled 2019-09-07: qty 0.8

## 2019-09-07 NOTE — Patient Instructions (Signed)

## 2019-09-07 NOTE — Progress Notes (Signed)
@Patient  ID: Jeffrey [name], male    DOB: Apr 22, 1934, 85 y.o.   MRN: [id number]  Chief Complaint  Patient presents with   Follow-up    Referring provider: [name], MD  HPI: 85 year old male, former smoker quit 1968 (15-pack-year history). 10 year history of asbestos exposure from working in [employer] 40 years ago. Past medical history significant for malignant pleural effusion mesothelioma, COPD/asthma, OSA, Parkinson's disease, rheumatoid arthritis, hypothyroidism, MRSE septic arthritis left knee, left total hip arthroplasty, anemia/ITP, coronary artery disease s/p CABG, mild aortic stenosis, anxiety. He is new patient to [facility] pulmonary office, he was see in inpatient by Dr. Elsworth Soho.  Recent hospitalization from 5/23-6/6 for shortness of breath and right pleural effusion. Consulted inpatient by Dr. Nelda Marseille and Dr. Elsworth Soho with pulmonary. CT chest 5/23 showed loculated right pleural effusion and pleural plaques.  CTA on 6/1 showed no PE. Status post right thoracentesis on 5/25. Fluid culture showed lymphocytic exudate with low glucose, felt to be unlikely rheumatoid effusion. Cytology negative with a few mesothelial cells. Uncertain for mesothelioma related to asbestos exposure.  Since TCTS consulted and patient had right VATS with pleural biopsy, TALC and right Pleurx catheter by [name] on 5/29.  Course complicated by an NSTEMI, status post catheterization per cardiology 6/4 with PCI to proximal LCx with DES.  Recommending aspirin and Brilinta x3 months and then Brilinta x9 months. Plan drainage pleurx MWF. Surgical pathology is still pending- contacted lab and was told pathology was sent for second opinion.  Previous [facility] pulmonary encounter: 06/01/2019 Patient presents today for hospital follow-up visit. Accompanied by his wife.  Patient is doing okay considering.  He continues to have moderate shortness of breath. He has been using Advair diskus twice daily. Patient does not have  any rescue inhaler. No PFTs on file, he is a former smoker. He had a chest x-ray yesterday with TCTS that showed unchanged small right pleural effusion and bibasilar atelectasis/scarring. Right Pleurx catheter is being drained three times a week. Wife states that it has been draining on average 150cc each time. He does not have much appetite.  He has tried protein shakes but does not like how thick they are. His mood is somewhat depressed/anxious.   07/06/2019 Patient presents today for 4 week follow-up. He was started on Spiriva at last visit. Continue Advair twice daily. Breathing has been ok. Continues Advair twice daily and spiriva once daily. Reports no noticeable improvement with addition of Spiriva 1.34mcg. He reports most noticeable benefit from his Albuterol rescue inhaler. Asking if he can get drainage bottles for his pleurex catheter. His appetite has improved.   Following with Dr. Nils Pyle office, seen on 6/22. Impression recurrent right pleural effusion secondary to mesothelioma. Continues pleurx drain scheduled but reduced to Mon-Thursday. Plan to repeat CXR in 1 month. Referred to thoracic oncology to discuss right malignant epithelioid pleural tumor. Seen by Dr. Julien Nordmann with Oncology on June 25th, ordered PET scan to r/o metastatic disease. Family wanted treatment; plan pallative systemic chemo with carboplatin.   08/23/2019 Patient presents today for acute visit with complaints of shortness of breath, cough and sore throat x 3-7 days. Not accompanied, wife in car. Reports constant shortness of breath and associated wheezing. Cough is productive, states mucus is so thick that he almost chokes on it. He is not currently taking Advair as prescribed. He is using Albuterol hfa 3-4 times a day and needs refill. Feels his tongue has chalk on it, reporting that it is "  rough/dry". States that he is forcing himself to eat and drink. Right pleurx catheter is being drained twice a week by his wife approx  150-245ml. Receiving chemotherapy every 3 weeks, last dose was on Thursday 8/27. Denies fever.   He was admitted to the hospital in mid-August 2020 and diagnosed with acute pulmonary embolism. CT angiogram of the chest showed single pulmonary embolism within a peripheral subsegmental branch to the posterior segment of right lower lobe, additional peripheral PE suspected. Started on Xarelto 15mg  twice daily x 3 weeks, then 20mg  daily.  Recently seen in ED on 08/19/19 and 08/20/19 for similar symptoms. O2 saturation 94% RA. BNP was elevated at 646 and received IV lasix. Urine culture positive for pseudomonas. Treated with Ciprofloxacin for urinary tract infection which he states completely drained him. He took two days of medication and was then changed to azithromycin.   09/07/2019 Patient presents today for a 2-week follow-up. Continues to experience shortness of breath.  Cough and mucus production have improved some since taking Advair and mucinex.  Reports that thrush is much better. Stopped Xarelto d/t blood in urine. Resuming today at lower dose. Currently on IV antibiotics for UTI. No new hematuria. Needing to repeat sputum culture today.  Imaging: 08/05/19 CTA- showed at least a single pulmonary embolism within a peripheral subsegmental branch to the posterior segment of the RIGHT lower lobe. Additional peripheral pulmonary emboli suspected. No central obstructing pulmonary embolism is identified within the main or intralobar pulmonary arteries bilaterally. Cardiomegaly. No pericardial effusion. Chronic atelectasis/pleural thickening at the RIGHT lung base. No evidence of pneumonia or pulmonary edema. No pleural effusion  06/30/19 PET- Thin rind of pleural tumor encasing the right lung identified and is FDG compatible with malignant mesothelioma. Small loculated right pleural effusion. Mildly FDG avid right hilar, subcarinal and high left paratracheal lymph nodes which may reflect metastatic  adenopathy  Allergies  Allergen Reactions   Lipitor [Atorvastatin] Other (See Comments)    muscle spasms cramps   Methylprednisolone Other (See Comments)    cramps cramps   Morphine And Related     Hallucinations at times   Other     If patient is to receive blood - blood must be treated witgh radiation because his body will not accept a normal infusion    Sulfa Antibiotics Itching   Ciprofloxacin Rash   Doxycycline Rash    Doxycycline caused itching redness on scalp and head Doxycycline caused itching redness on scalp and head    Immunization History  Administered Date(s) Administered   Influenza,inj,Quad PF,6+ Mos 09/04/2014   Pneumococcal Polysaccharide-23 09/05/2011   Tdap 09/16/2012    Past Medical History:  Diagnosis Date   Anxiety    Aortic stenosis    mild AS by 07/20/13 echo ([facility])   Arthritis    "all over"   Coronary artery disease    NSTEMI 06/2010, CABG x3=> [place]->LAD, SVG->OM1, SVG->PDA, DES LCx 10/2011, DES LAD 12/2011   Depression    GERD (gastroesophageal reflux disease)    H/O hiatal hernia    Hearing aid worn    B/L   High cholesterol    History of blood transfusion reaction Mar 12, 2013   "he almost died; he has to get irradiated blood next time"   Hypertension    Hypothyroidism    Ischemic cardiomyopathy    ITP (idiopathic thrombocytopenic purpura)    Dr. Gaynelle Arabian, on Promacta   Myocardial infarction [facility]) 03/12/10   Pneumonia 1940's X 1; 03-12-14 X 1  Rheumatoid arthritis (HCC)    Shortness of breath    with exertion, has not been very active   Sleep apnea    Wears glasses    Wears partial dentures     Tobacco History: Social History   Tobacco Use  Smoking Status Former Smoker   Packs/day: 1.00   Years: 15.00   Pack years: 15.00   Types: Cigarettes   Quit date: 12/22/1966   Years since quitting: 52.7  Smokeless Tobacco Former User   Types: [place]   "quit smoking ~  late ~ 60's; quit chewing in the 1970's"   Counseling given: Not Answered Comment: "quit smoking ~ late ~ 60's; quit chewing in the 1970's"   Outpatient Medications Prior to Visit  Medication Sig Dispense Refill   albuterol (VENTOLIN HFA) 108 (90 Base) MCG/ACT inhaler Inhale 1 puff into the lungs 2 (two) times daily as needed for shortness of breath.     ALPRAZolam (XANAX) 0.25 MG tablet Take 1 tablet (0.25 mg total) by mouth 2 (two) times daily as needed for anxiety. 20 tablet 0   azithromycin (ZITHROMAX Z-PAK) 250 MG tablet 500 mg PO Day 1 250 mg PO Day 2-5 (Patient taking differently: Take 250-500 mg by mouth See admin instructions. 500 mg PO Day 1 250 mg PO Day 2-5 Prescribed on 08/19/2019) 6 tablet 0   benzonatate (TESSALON) 100 MG capsule Take 1 capsule (100 mg total) by mouth every 8 (eight) hours. 21 capsule 0   dexamethasone (DECADRON) 4 MG tablet 4 mg p.o. twice daily the day before, day of and day after chemotherapy every 3 weeks 40 tablet 1   folic acid (FOLVITE) 1 MG tablet Take 1 tablet (1 mg total) by mouth daily. 30 tablet 4   guaiFENesin (MUCINEX) 600 MG 12 hr tablet Take 1 tablet (600 mg total) by mouth 2 (two) times daily. 60 tablet 1   ipratropium-albuterol (DUONEB) 0.5-2.5 (3) MG/3ML SOLN Take 3 mLs by nebulization every 6 (six) hours as needed. J44.9 (Patient taking differently: Take 3 mLs by nebulization every 6 (six) hours as needed (shortness of breath). J44.9) 360 mL 3   levothyroxine (SYNTHROID, LEVOTHROID) 200 MCG tablet Take 200 mcg by mouth at bedtime.      lidocaine-prilocaine (EMLA) cream Apply 1 application topically as needed. 30 g 0   methotrexate (RHEUMATREX) 2.5 MG tablet Take 15 mg by mouth once a week. Caution:Chemotherapy. Protect from light.     mirtazapine (REMERON) 15 MG tablet Take 15 mg by mouth at bedtime.     Omega-3 Fatty Acids (FISH OIL) 1200 MG CAPS Take 1,200 mg by mouth at bedtime. 360 MG OMEGA-3     omeprazole (PRILOSEC) 40 MG  capsule Take 40 mg by mouth at bedtime.      predniSONE (DELTASONE) 10 MG tablet Take 10 mg by mouth daily with breakfast.     prochlorperazine (COMPAZINE) 10 MG tablet Take 1 tablet (10 mg total) by mouth every 6 (six) hours as needed for nausea or vomiting. 30 tablet 0   rOPINIRole (REQUIP) 0.5 MG tablet Take 0.5 mg by mouth at bedtime.     Vitamin D, Ergocalciferol, (DRISDOL) 1.25 MG (50000 UT) CAPS capsule Take 50,000 Units by mouth every 7 (seven) days.     filgrastim-sndz [facility]) injection 480 mcg      No facility-administered medications prior to visit.    Review of Systems  Review of Systems  Constitutional: Positive for fatigue.  Respiratory: Positive for cough and  shortness of breath. Negative for wheezing.   Neurological: Positive for tremors and weakness.   Physical Exam  BP 130/60 (BP Location: Left Arm, Cuff Size: Large)    Pulse 78    Ht 6' (1.829 m)    Wt 207 lb (93.9 kg)    SpO2 97%    BMI 28.07 kg/m  Physical Exam Constitutional:      General: He is not in acute distress.    Appearance: Normal appearance. He is not toxic-appearing.     Comments: Chronically ill appearing   HENT:     Head: Normocephalic and atraumatic.     Nose: Nose normal.  Eyes:     Pupils: Pupils are equal, round, and reactive to light.  Cardiovascular:     Rate and Rhythm: Normal rate and regular rhythm.     Heart sounds: Murmur present.  Pulmonary:     Breath sounds: No wheezing or rhonchi.     Comments: Clear, diminished Musculoskeletal: Normal range of motion.     Comments: In [facility]  Neurological:     General: No focal deficit present.     Mental Status: He is alert. Mental status is at baseline.  Psychiatric:        Mood and Affect: Mood normal.        Thought Content: Thought content normal.        Judgment: Judgment normal.      Lab Results:  CBC    Component Value Date/Time   WBC 3.3 (L) 09/06/2019 0947   WBC 7.2 08/20/2019 2130   RBC 2.74 (L) 09/06/2019 0947    HGB 8.2 (L) 09/06/2019 0947   HCT 26.4 (L) 09/06/2019 0947   PLT 356 09/06/2019 0947   MCV 96.4 09/06/2019 0947   MCH 29.9 09/06/2019 0947   MCHC 31.1 09/06/2019 0947   RDW 19.6 (H) 09/06/2019 0947   LYMPHSABS 1.4 09/06/2019 0947   MONOABS 1.1 (H) 09/06/2019 0947   EOSABS 0.0 09/06/2019 0947   BASOSABS 0.0 09/06/2019 0947    BMET    Component Value Date/Time   NA 143 09/06/2019 0947   K 4.6 09/06/2019 0947   CL 109 09/06/2019 0947   CO2 25 09/06/2019 0947   GLUCOSE 136 (H) 09/06/2019 0947   BUN 13 09/06/2019 0947   CREATININE 1.02 09/06/2019 0947   CREATININE 1.79 (H) 10/12/2014 1136   CALCIUM 8.7 (L) 09/06/2019 0947   GFRNONAA >60 09/06/2019 0947   GFRNONAA 36 (L) 10/12/2014 1136   GFRAA >60 09/06/2019 0947   GFRAA 41 (L) 10/12/2014 1136    BNP    Component Value Date/Time   BNP 646.0 (H) 08/19/2019 1801    ProBNP No results found for: PROBNP  Imaging: Dg Chest 2 View  Result Date: 09/07/2019 CLINICAL DATA:  Cough and short of breath EXAM: CHEST - 2 VIEW COMPARISON:  08/20/2019 FINDINGS: Post sternotomy changes. Right-sided central venous port tip over the distal SVC. Right pleural effusion or thickening, no change with stable mild airspace disease at the right base. The left lung is grossly clear. Enlarged cardiomediastinal silhouette with aortic atherosclerosis. No pneumothorax. A right basilar pleural drainage catheter remains in place. IMPRESSION: No significant change in small right-sided pleural effusion or thickening and mild right basilar airspace disease. No acute interval change as compared with 08/20/2019. Electronically Signed   By: Donavan Foil M.D.   On: 09/07/2019 16:17   Dg Chest 2 View  Result Date: 08/17/2019 CLINICAL DATA:  Pleural effusion EXAM: CHEST -  2 VIEW COMPARISON:  08/05/2019 FINDINGS: Prior median sternotomy. Right-sided chest port remains in place. Small caliber right pleural drainage catheter. Small loculated right sided pleural effusion  with associated right basilar opacity is not significantly changed from prior. Left lung remains clear. No pneumothorax. IMPRESSION: Persistent loculated right-sided pleural effusion, not significantly changed from prior. Electronically Signed   By: Davina Poke M.D.   On: 08/17/2019 14:50   Ct Angio Chest Pe W/cm &/or Wo Cm  Result Date: 08/21/2019 CLINICAL DATA:  85 year old male with shortness of breath with concern for pulmonary embolism. EXAM: CT ANGIOGRAPHY CHEST WITH CONTRAST TECHNIQUE: Multidetector CT imaging of the chest was performed using the standard protocol during bolus administration of intravenous contrast. Multiplanar CT image reconstructions and MIPs were obtained to evaluate the vascular anatomy. CONTRAST:  154mL OMNIPAQUE IOHEXOL 350 MG/ML SOLN COMPARISON:  Chest CT dated 08/05/2019 FINDINGS: Cardiovascular: There is mild cardiomegaly. No pericardial effusion. Three-vessel coronary vascular calcification. Advanced calcified and noncalcified plaque of the thoracic aorta. Evaluation of the pulmonary arteries is limited due to respiratory motion artifact and suboptimal visualization of the distal branches. No large or central pulmonary artery embolus identified. Right-sided Port-A-Cath with tip close to the cavoatrial junction. Mediastinum/Nodes: No hilar or mediastinal adenopathy. The esophagus is grossly unremarkable. No mediastinal fluid collection. Lungs/Pleura: There is a small right pleural effusion with right lower lobe compressive atelectasis although pneumonia is not excluded. A pleural catheter is noted with tip in the medial right lung base pleural surface. There is no pneumothorax. The central airways are patent. Upper Abdomen: Cirrhosis. The visualized upper abdomen is otherwise unremarkable. Musculoskeletal: Median sternotomy wires. Multilevel degenerative changes of the spine. No acute osseous pathology. Review of the MIP images confirms the above findings. IMPRESSION: 1. No  CT evidence of central pulmonary artery embolus. 2. Small right pleural effusion with right lower lobe compressive atelectasis. Pneumonia is not excluded. 3. Cirrhosis. 4. Aortic Atherosclerosis (ICD10-I70.0). Electronically Signed   By: Anner Crete M.D.   On: 08/21/2019 01:26   Dg Chest Port 1 View  Result Date: 08/20/2019 CLINICAL DATA:  Dyspnea EXAM: PORTABLE CHEST 1 VIEW COMPARISON:  August 19, 2019 FINDINGS: The right-sided Port-A-Cath is stable in positioning. There is a persistent small right-sided pleural effusion. No pneumothorax. There is an unchanged airspace opacity at the right lung base. The heart size is stable. The patient is status post prior median sternotomy. IMPRESSION: No significant interval change. Persistent right-sided pleural effusion. Electronically Signed   By: Constance Holster M.D.   On: 08/20/2019 23:09   Dg Chest Portable 1 View  Result Date: 08/19/2019 CLINICAL DATA:  Shortness of breath and cough for 2 weeks. This is worsened over the last 2 days. EXAM: PORTABLE CHEST 1 VIEW COMPARISON:  08/17/2019 FINDINGS: Power injectable Port-A-Cath tip: SVC. Prior CABG. Moderate enlargement of the cardiopericardial silhouette with chronic blunting of the right lateral costophrenic angle and indistinct opacity along the right hemidiaphragm. There is likely still a pleural drainage catheter at the right lung base although this is difficult to see because of all of the superimposed ECG lead wires. Upper zone pulmonary vascular prominence probably reflects pulmonary venous hypertension and is increased from 08/17/2019. IMPRESSION: 1. Moderate enlargement of the cardiopericardial silhouette with continued small right pleural effusion and indistinct opacity at the right lung base. I suspect that the indwelling pleural drainage catheter still present although there many superimposed ECG lead wires in this vicinity making identification of the pleural drainage catheter difficult. 2.  Increased cephalization  of blood flow suggesting pulmonary venous hypertension. 3. Prior CABG. Electronically Signed   By: Van Clines M.D.   On: 08/19/2019 18:28     Assessment & Plan:   Pleural effusion on right - S/p right thoracentesis on 5/25. Cytology negative with a few mesothelial cells. TCTS consulted and patient had right VATS with pleural biopsy, TALC and right Pleurx catheter by Dr. Nils Pyle on 5/29 - Right pleurx catheter being drainaged twice a week by his wife approx 150-288ml  - CXR today 09/07/2019 showed no significant change in small right-sided pleural effusion   Malignant mesothelioma of pleura (HCC) - Receiving chemotherapy every 3 weeks - Unable to receive yesterday d/t low platelet count   Pulmonary embolism ([place]) - Continues to have some shortness of breath and weakness - Stopped Xarelto d/t hematuria which has resolved. Being treated for UTI. Restarting at lower dose today   COPD (chronic obstructive pulmonary disease) (HCC) - Hx COPD/asthma, no PFTs on file - Recently restarted Advair 1 puff twice daily  - Continues mucinex twice daily with improvement in congestion - Advised incentive spirometer 2-3 times a day - Repeat sputum culture if able    Martyn Ehrich, NP 09/08/2019

## 2019-09-07 NOTE — Telephone Encounter (Signed)
Scheduled appt per 9/15 los - pt to get an updated schedule next visit - inj appt today

## 2019-09-07 NOTE — Progress Notes (Signed)
Please let patient know CXR looked stable. Small unchanged right pleural effusion or thickening. No evidence of pneumonia. Enlarged cardiomediastinal silhouette- recommend following up with cardiology/PCP

## 2019-09-07 NOTE — Telephone Encounter (Signed)
These are all chronic symptoms, he is going ot come at 3 for in office appt. He has been tested multiple times all have been negative and he hasn't had any new symptoms since last test. He can giv you a new sputum sample at this visit also,.

## 2019-09-07 NOTE — Telephone Encounter (Signed)
Ok, sounds good.

## 2019-09-07 NOTE — Patient Instructions (Addendum)
COPD/asthma - Continue Advair 1 puff twice daily - Albuterol hfa/duoneb q6 hours AS NEEDED for shortness of breath/wheezing - Take mucinex twice daily for congestion - Use incentive spirometer 2-3 times a day  - Repeat Sputum culture  (take mucinex and use nebulizer before to help produce mucus)  Orders: - CXR today [id number]   Follow-up: - Needs PFTs and follow-up with new pulmonary MD only in 4 weeks  (seen in patient by Elsworth Soho but if he has nothing available please establish with another MD)

## 2019-09-07 NOTE — Telephone Encounter (Signed)
Called spoke with wife. She states she has an appointment today she thought it was in office but appt note states televisit- will route to BW to see if this possible to have in office , if not wife will come by after her cancer center appt and pick up the cup for new sample. Patient still coughing up a little mucus.   Beth please advise if this appt can be in office or does he have to stay tele

## 2019-09-07 NOTE — Telephone Encounter (Signed)
When did cough start? He had covid testing in August that was negative. If symptoms started after should have repeat testing. Otherwise I think he is fine to come into office if this is chronic and not experiencing fevers/sore throat/increased shortness of breath. If they would prefer televisit over OV we can do that as well. Let me know. Thanks

## 2019-09-08 ENCOUNTER — Other Ambulatory Visit: Payer: Self-pay

## 2019-09-08 ENCOUNTER — Encounter: Payer: Self-pay | Admitting: Primary Care

## 2019-09-08 ENCOUNTER — Other Ambulatory Visit: Payer: Medicare Other

## 2019-09-08 ENCOUNTER — Inpatient Hospital Stay: Payer: Medicare Other

## 2019-09-08 VITALS — BP 123/67 | HR 82 | Temp 98.3°F | Resp 19

## 2019-09-08 DIAGNOSIS — J410 Simple chronic bronchitis: Secondary | ICD-10-CM

## 2019-09-08 DIAGNOSIS — D709 Neutropenia, unspecified: Secondary | ICD-10-CM

## 2019-09-08 DIAGNOSIS — C450 Mesothelioma of pleura: Secondary | ICD-10-CM | POA: Diagnosis not present

## 2019-09-08 MED ORDER — FILGRASTIM-SNDZ 480 MCG/0.8ML IJ SOSY
480.0000 ug | PREFILLED_SYRINGE | Freq: Once | INTRAMUSCULAR | Status: AC
  Administered 2019-09-08: 12:00:00 480 ug via SUBCUTANEOUS
  Filled 2019-09-08: qty 0.8

## 2019-09-08 NOTE — Patient Instructions (Signed)

## 2019-09-08 NOTE — Assessment & Plan Note (Signed)
-   Hx COPD/asthma, no PFTs on file - Recently restarted Advair 1 puff twice daily  - Continues mucinex twice daily with improvement in congestion - Advised incentive spirometer 2-3 times a day - Repeat sputum culture if able

## 2019-09-08 NOTE — Assessment & Plan Note (Addendum)
-   S/p right thoracentesis on 5/25. Cytology negative with a few mesothelial cells. TCTS consulted and patient had right VATS with pleural biopsy, TALC and right Pleurx catheter by Dr. Nils Pyle on 5/29 - Right pleurx catheter being drainaged twice a week by his wife approx 150-241ml  - CXR today 09/07/2019 showed no significant change in small right-sided pleural effusion

## 2019-09-08 NOTE — Assessment & Plan Note (Signed)
-   Receiving chemotherapy every 3 weeks - Unable to receive yesterday d/t low platelet count

## 2019-09-08 NOTE — Progress Notes (Signed)
Reviewed and agree with assessment/plan.   Rosalie Gelpi, MD [facility] 12/17/2016, 12:24 PM Pager:  [phone]  

## 2019-09-08 NOTE — Assessment & Plan Note (Signed)
-   Continues to have some shortness of breath and weakness - Stopped Xarelto d/t hematuria which has resolved. Being treated for UTI. Restarting at lower dose today

## 2019-09-11 LAB — RESPIRATORY CULTURE OR RESPIRATORY AND SPUTUM CULTURE
MICRO NUMBER:: 893750
RESULT:: NORMAL
SPECIMEN QUALITY:: ADEQUATE

## 2019-09-12 ENCOUNTER — Emergency Department (HOSPITAL_COMMUNITY)
Admission: EM | Admit: 2019-09-12 | Discharge: 2019-09-12 | Disposition: A | Discharge status: Home / Self Care | Payer: Medicare Other | Attending: Emergency Medicine | Admitting: Emergency Medicine

## 2019-09-12 ENCOUNTER — Emergency Department (HOSPITAL_COMMUNITY): Payer: Medicare Other

## 2019-09-12 ENCOUNTER — Encounter (HOSPITAL_COMMUNITY): Payer: Self-pay | Admitting: Emergency Medicine

## 2019-09-12 ENCOUNTER — Other Ambulatory Visit: Payer: Self-pay

## 2019-09-12 DIAGNOSIS — J189 Pneumonia, unspecified organism: Secondary | ICD-10-CM | POA: Diagnosis not present

## 2019-09-12 DIAGNOSIS — I25110 Atherosclerotic heart disease of native coronary artery with unstable angina pectoris: Secondary | ICD-10-CM | POA: Insufficient documentation

## 2019-09-12 DIAGNOSIS — Z87891 Personal history of nicotine dependence: Secondary | ICD-10-CM | POA: Insufficient documentation

## 2019-09-12 DIAGNOSIS — I252 Old myocardial infarction: Secondary | ICD-10-CM | POA: Insufficient documentation

## 2019-09-12 DIAGNOSIS — Z96643 Presence of artificial hip joint, bilateral: Secondary | ICD-10-CM | POA: Diagnosis not present

## 2019-09-12 DIAGNOSIS — Z79899 Other long term (current) drug therapy: Secondary | ICD-10-CM | POA: Insufficient documentation

## 2019-09-12 DIAGNOSIS — R0602 Shortness of breath: Secondary | ICD-10-CM | POA: Diagnosis present

## 2019-09-12 DIAGNOSIS — R0600 Dyspnea, unspecified: Secondary | ICD-10-CM

## 2019-09-12 DIAGNOSIS — Z96652 Presence of left artificial knee joint: Secondary | ICD-10-CM | POA: Insufficient documentation

## 2019-09-12 DIAGNOSIS — Z20828 Contact with and (suspected) exposure to other viral communicable diseases: Secondary | ICD-10-CM | POA: Insufficient documentation

## 2019-09-12 DIAGNOSIS — J449 Chronic obstructive pulmonary disease, unspecified: Secondary | ICD-10-CM | POA: Diagnosis not present

## 2019-09-12 DIAGNOSIS — M069 Rheumatoid arthritis, unspecified: Secondary | ICD-10-CM | POA: Diagnosis not present

## 2019-09-12 DIAGNOSIS — Z951 Presence of aortocoronary bypass graft: Secondary | ICD-10-CM | POA: Diagnosis not present

## 2019-09-12 DIAGNOSIS — Z7901 Long term (current) use of anticoagulants: Secondary | ICD-10-CM | POA: Insufficient documentation

## 2019-09-12 DIAGNOSIS — E039 Hypothyroidism, unspecified: Secondary | ICD-10-CM | POA: Diagnosis not present

## 2019-09-12 LAB — CBC WITH DIFFERENTIAL/PLATELET
Abs Immature Granulocytes: 1.72 10*3/uL — ABNORMAL HIGH (ref 0.00–0.07)
Basophils Absolute: 0.1 10*3/uL (ref 0.0–0.1)
Basophils Relative: 1 %
Eosinophils Absolute: 0.1 10*3/uL (ref 0.0–0.5)
Eosinophils Relative: 0 %
HCT: 28 % — ABNORMAL LOW (ref 39.0–52.0)
Hemoglobin: 8.4 g/dL — ABNORMAL LOW (ref 13.0–17.0)
Immature Granulocytes: 10 %
Lymphocytes Relative: 11 %
Lymphs Abs: 1.8 10*3/uL (ref 0.7–4.0)
MCH: 30 pg (ref 26.0–34.0)
MCHC: 30 g/dL (ref 30.0–36.0)
MCV: 100 fL (ref 80.0–100.0)
Monocytes Absolute: 2.2 10*3/uL — ABNORMAL HIGH (ref 0.1–1.0)
Monocytes Relative: 13 %
Neutro Abs: 11.1 10*3/uL — ABNORMAL HIGH (ref 1.7–7.7)
Neutrophils Relative %: 65 %
Platelets: 264 10*3/uL (ref 150–400)
RBC: 2.8 MIL/uL — ABNORMAL LOW (ref 4.22–5.81)
RDW: 21.1 % — ABNORMAL HIGH (ref 11.5–15.5)
WBC: 16.9 10*3/uL — ABNORMAL HIGH (ref 4.0–10.5)
nRBC: 0.4 % — ABNORMAL HIGH (ref 0.0–0.2)

## 2019-09-12 LAB — URINALYSIS, ROUTINE W REFLEX MICROSCOPIC
Bilirubin Urine: NEGATIVE
Glucose, UA: NEGATIVE mg/dL
Ketones, ur: NEGATIVE mg/dL
Nitrite: NEGATIVE
Protein, ur: NEGATIVE mg/dL
Specific Gravity, Urine: 1.015 (ref 1.005–1.030)
pH: 7 (ref 5.0–8.0)

## 2019-09-12 LAB — BASIC METABOLIC PANEL
Anion gap: 8 (ref 5–15)
BUN: 15 mg/dL (ref 8–23)
CO2: 26 mmol/L (ref 22–32)
Calcium: 8.5 mg/dL — ABNORMAL LOW (ref 8.9–10.3)
Chloride: 106 mmol/L (ref 98–111)
Creatinine, Ser: 0.87 mg/dL (ref 0.61–1.24)
GFR calc Af Amer: >60 mL/min (ref >60)
GFR calc non Af Amer: >60 mL/min (ref >60)
Glucose, Bld: 141 mg/dL — ABNORMAL HIGH (ref 70–99)
Potassium: 4.1 mmol/L (ref 3.5–5.1)
Sodium: 140 mmol/L (ref 135–145)

## 2019-09-12 LAB — SARS CORONAVIRUS 2 (TAT 6-24 HRS): SARS Coronavirus 2: NEGATIVE

## 2019-09-12 MED ORDER — HEPARIN SOD (PORK) LOCK FLUSH 100 UNIT/ML IV SOLN
500.0000 [IU] | Freq: Once | INTRAVENOUS | Status: AC
  Administered 2019-09-12: 500 [IU]
  Filled 2019-09-12: qty 5

## 2019-09-12 MED ORDER — LEVOFLOXACIN 500 MG PO TABS: 500.0000 mg | ORAL_TABLET | Freq: Every day | ORAL | 0 refills | Status: DC

## 2019-09-12 MED ORDER — LEVOFLOXACIN IN D5W 500 MG/100ML IV SOLN
500.0000 mg | Freq: Once | INTRAVENOUS | Status: AC
  Administered 2019-09-12: 500 mg via INTRAVENOUS
  Filled 2019-09-12: qty 100

## 2019-09-12 MED ORDER — IOHEXOL 350 MG/ML SOLN
100.0000 mL | Freq: Once | INTRAVENOUS | Status: AC | PRN
  Administered 2019-09-12: 100 mL via INTRAVENOUS

## 2019-09-12 NOTE — ED Notes (Signed)
ED Provider at bedside. 

## 2019-09-12 NOTE — ED Notes (Signed)
Patient transported to CT 

## 2019-09-12 NOTE — ED Triage Notes (Signed)
Pt from home with wife.  Went to an infusion site in [place] today to receive IV ABX.  Pt became sob and required 2 L [place] en route to ER.  Pt c/o of cough, sob and weakness with exertion x 3 days. HX of lung cancer

## 2019-09-12 NOTE — ED Provider Notes (Signed)
[facility] EMERGENCY DEPARTMENT Provider Note   CSN: [id number] Arrival date & time: 09/12/19  3818     History   Chief Complaint Chief Complaint  Patient presents with   Shortness of Breath    HPI Jeffrey [name] is a 85 y.o. male.     HPI   85yM with dyspnea. Chronic. Noted to be hypoxic when went to clinic today. Was receving IV abx for UTI.  Says he more or less chronically feels short of breath but it has been worse in the last week or so.  Occasional cough is nonproductive.  No fevers.  No unusual swelling.  History of lung cancer.  Right Pleurx catheter in place.  Past Medical History:  Diagnosis Date   Anxiety    Aortic stenosis    mild AS by 07/20/13 echo ([facility])   Arthritis    "all over"   Coronary artery disease    NSTEMI 06/2010, CABG x3=> [place]->LAD, SVG->OM1, SVG->PDA, DES LCx 10/2011, DES LAD 12/2011   Depression    GERD (gastroesophageal reflux disease)    H/O hiatal hernia    Hearing aid worn    B/L   High cholesterol    History of blood transfusion reaction 2013/03/29   "he almost died; he has to get irradiated blood next time"   Hypertension    Hypothyroidism    Ischemic cardiomyopathy    ITP (idiopathic thrombocytopenic purpura)    Dr. Gaynelle Arabian, on Promacta   Myocardial infarction [facility] 03/29/10   Pneumonia 1940's X 1; 2014-03-29 X 1   Rheumatoid arthritis ([place])    Shortness of breath    with exertion, has not been very active   Sleep apnea    Wears glasses    Wears partial dentures     Patient Active Problem List   Diagnosis Date Noted   Neutropenia ([place]) 09/06/2019   COPD (chronic obstructive pulmonary disease) ([place]) 08/23/2019   Thrush 08/23/2019   Pulmonary embolus ([place]) 08/05/2019   Acute lower UTI 08/05/2019   Pulmonary embolism ([place]) 08/05/2019   Malignant mesothelioma of pleura ([place]) 06/16/2019   Goals of care, counseling/discussion 06/16/2019   Encounter for antineoplastic chemotherapy  06/16/2019   NSTEMI (non-ST elevated myocardial infarction) ([place]) 06/01/2019   Shortness of breath    Coronary artery disease due to lipid rich plaque    Elevated troponin    Coronary artery disease involving native coronary artery of native heart with unstable angina pectoris (HCC)    Pleural effusion on right 05/14/2019   Pancreatic lesion 05/14/2019   PD (Parkinson's disease) ([place]) 12/02/2018   Primary osteoarthritis of right hip 06/29/2018   Primary osteoarthritis of left knee 11/24/2014   Primary osteoarthritis of knee 11/24/2014   Bacterial infection of knee joint ([place]) 08/17/2014   Septic joint of left knee joint ([place]) 06/22/2014   CAP (community acquired pneumonia) 01/26/2014   CAD (coronary artery disease) of artery bypass graft 01/26/2014   Hypothyroidism 01/26/2014   Thrombocytopenia, unspecified ([place]) 01/26/2014   Nausea alone 01/26/2014   Effusion of knee joint, left 10/11/2012   OA (osteoarthritis) of knee 10/11/2012   HIP PAIN 11/15/2007   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 11/15/2007    Past Surgical History:  Procedure Laterality Date   APPENDECTOMY     BACK SURGERY     CATARACT EXTRACTION, BILATERAL     CHEST TUBE INSERTION Right 05/20/2019   Procedure: INSERTION PLEURAL DRAINAGE CATHETER;  Surgeon: Ivin Poot, MD;  Location: [facility]  OR;  Service: Thoracic;  Laterality: Right;   COLONOSCOPY     CORONARY ANGIOPLASTY WITH STENT PLACEMENT     DES Lcx 10/2011, DES LAD 12/2011   CORONARY ARTERY BYPASS GRAFT  2011   "CABG X3"   CORONARY STENT INTERVENTION N/A 05/26/2019   Procedure: CORONARY STENT INTERVENTION;  Surgeon: Nelva Bush, MD;  Location: [place] [facility];  Service: Cardiovascular;  Laterality: N/A;   GASTROC RECESSION EXTREMITY Right 07/06/2015   Pasty Spillers (orthopedics- [place], [place])   HERNIA REPAIR     HIATAL HERNIA REPAIR     IR IMAGING GUIDED PORT INSERTION  06/29/2019   KNEE ARTHROSCOPY Left  06/22/2014   w/I&D   KNEE ARTHROSCOPY Left 06/22/2014   Procedure: IRRIGATION AND DEBRIDEMENT WITH CHRONDROPLASTY;  Surgeon: Hessie Dibble, MD;  Location: [place];  Service: Orthopedics;  Laterality: Left;   LEFT HEART CATH AND CORS/GRAFTS ANGIOGRAPHY N/A 05/26/2019   Procedure: LEFT HEART CATH AND CORS/GRAFTS ANGIOGRAPHY;  Surgeon: Nelva Bush, MD;  Location: [place] [facility];  Service: Cardiovascular;  Laterality: N/A;   LUMBAR DISC SURGERY  1960's?   MULTIPLE TOOTH EXTRACTIONS     PLEURAL BIOPSY Right 05/20/2019   Procedure: PLEURAL BIOPSY;  Surgeon: Ivin Poot, MD;  Location: [place];  Service: Thoracic;  Laterality: Right;   SHOULDER ARTHROSCOPY Right 04/09/2017   Procedure: ARTHROSCOPY SHOULDER;  Surgeon: Melrose Nakayama, MD;  Location: [place];  Service: Orthopedics;  Laterality: Right;   STERNAL CLOSURE     "wires from OHS taken out; plate put in" (3/0/1601)   SYNOVECTOMY Left 08/17/2014   Procedure: SYNOVECTOMY LEFT KNEE;  Surgeon: Hessie Dibble, MD;  Location: [place];  Service: Orthopedics;  Laterality: Left;   TOTAL ANKLE ARTHROPLASTY Right 07/06/2015   Pasty Spillers (orthopedics- [place])   TOTAL HIP ARTHROPLASTY Right 06/29/2018   Procedure: RIGHT TOTAL HIP ARTHROPLASTY ANTERIOR APPROACH;  Surgeon: Melrose Nakayama, MD;  Location: [facility];  Service: Orthopedics;  Laterality: Right;   TOTAL HIP ARTHROPLASTY Left 06/09/2016   Nicki Reaper Streater Claiborne Billings (orthopedics- [facility])   TOTAL KNEE ARTHROPLASTY Left 11/24/2014   Procedure: TOTAL KNEE ARTHROPLASTY;  Surgeon: Hessie Dibble, MD;  Location: [place];  Service: Orthopedics;  Laterality: Left;   VIDEO ASSISTED THORACOSCOPY Right 05/20/2019   Procedure: VIDEO ASSISTED THORACOSCOPY;  Surgeon: Ivin Poot, MD;  Location: [place];  Service: Thoracic;  Laterality: Right;        Home Medications    Prior to Admission medications   Medication Sig Start Date End Date Taking? Authorizing Provider  albuterol (VENTOLIN  HFA) 108 (90 Base) MCG/ACT inhaler Inhale 1 puff into the lungs 2 (two) times daily as needed for shortness of breath. 07/23/19  Yes [provider]  ALPRAZolam (XANAX) 0.25 MG tablet Take 1 tablet (0.25 mg total) by mouth 2 (two) times daily as needed for anxiety. 07/02/18  Yes Loni Dolly, PA-C  benzonatate (TESSALON) 100 MG capsule Take 1 capsule (100 mg total) by mouth every 8 (eight) hours. 08/19/19  Yes Corena Herter, PA-C  dexamethasone (DECADRON) 4 MG tablet 4 mg p.o. twice daily the day before, day of and day after chemotherapy every 3 weeks 06/16/19  Yes Curt Bears, MD  Fluticasone-Salmeterol (ADVAIR) 100-50 MCG/DOSE AEPB Inhale 1 puff into the lungs 2 (two) times daily.   Yes [provider]  folic acid (FOLVITE) 1 MG tablet Take 1 tablet (1 mg total) by mouth daily. 06/16/19  Yes Curt Bears, MD  guaiFENesin [facility])  600 MG 12 hr tablet Take 1 tablet (600 mg total) by mouth 2 (two) times daily. 08/23/19  Yes Martyn Ehrich, NP  ipratropium-albuterol (DUONEB) 0.5-2.5 (3) MG/3ML SOLN Take 3 mLs by nebulization every 6 (six) hours as needed. J44.9 Patient taking differently: Take 3 mLs by nebulization every 6 (six) hours as needed (shortness of breath). J44.9 07/11/19  Yes Martyn Ehrich, NP  levothyroxine (SYNTHROID, LEVOTHROID) 200 MCG tablet Take 200 mcg by mouth See admin instructions. Take 1 tablet Monday through Friday - None on Saturday or Sunday.   Yes [provider]  lidocaine-prilocaine (EMLA) cream Apply 1 application topically as needed. 06/21/19  Yes Curt Bears, MD  methotrexate (RHEUMATREX) 2.5 MG tablet Take 15 mg by mouth once a week. Caution:Chemotherapy. Protect from light.   Yes [provider]  mirtazapine (REMERON) 15 MG tablet Take 15 mg by mouth at bedtime.   Yes [provider]  nystatin (MYCOSTATIN) 100000 UNIT/ML suspension Take 5 mLs by mouth 4 (four) times daily.   Yes [provider]    omeprazole (PRILOSEC) 40 MG capsule Take 40 mg by mouth at bedtime.    Yes [provider]  azithromycin (ZITHROMAX Z-PAK) 250 MG tablet 500 mg PO Day 1 250 mg PO Day 2-5 Patient not taking: Reported on 09/12/2019 08/19/19   Corena Herter, PA-C  Omega-3 Fatty Acids (FISH OIL) 1200 MG CAPS Take 1,200 mg by mouth at bedtime. 360 MG OMEGA-3    [provider]  predniSONE (DELTASONE) 10 MG tablet Take 10 mg by mouth daily with breakfast.    [provider]  prochlorperazine (COMPAZINE) 10 MG tablet Take 1 tablet (10 mg total) by mouth every 6 (six) hours as needed for nausea or vomiting. 08/09/19   Rai, Ripudeep K, MD  rOPINIRole (REQUIP) 0.5 MG tablet Take 0.5 mg by mouth at bedtime.    [provider]  Vitamin D, Ergocalciferol, (DRISDOL) 1.25 MG (50000 UT) CAPS capsule Take 50,000 Units by mouth every 7 (seven) days.    [provider]    Family History Family History  Problem Relation Age of Onset   Heart disease Other    Arthritis Other    Heart disease Mother    Alzheimer's disease Father    Rheum arthritis Sister    Rheum arthritis Brother    Healthy Son     Social History Social History   Tobacco Use   Smoking status: Former Smoker    Packs/day: 1.00    Years: 15.00    Pack years: 15.00    Types: Cigarettes    Quit date: 12/22/1966    Years since quitting: 52.7   Smokeless tobacco: Former Systems developer    Types: Chew   Tobacco comment: "quit smoking ~ late ~ 60's; quit chewing in the 1970's"  Substance Use Topics   Alcohol use: No   Drug use: No     Allergies   Lipitor [atorvastatin], Methylprednisolone, Morphine and related, Other, Sulfa antibiotics, Ciprofloxacin, and Doxycycline   Review of Systems Review of Systems All systems reviewed and negative, other than as noted in HPI.   Physical Exam Updated Vital Signs BP 138/90    Pulse 85    Temp 99 F (37.2 C) (Rectal)    Resp (!) 23    SpO2 97%   Physical  Exam Vitals signs and nursing note reviewed.  Constitutional:      General: He is not in acute distress.    Appearance: He  is well-developed.  HENT:     Head: Normocephalic and atraumatic.  Eyes:     General:        Right eye: No discharge.        Left eye: No discharge.     Conjunctiva/sclera: Conjunctivae normal.  Neck:     Musculoskeletal: Neck supple.  Cardiovascular:     Rate and Rhythm: Normal rate and regular rhythm.     Heart sounds: Normal heart sounds. No murmur. No friction rub. No gallop.   Pulmonary:     Effort: No respiratory distress.     Comments: pleurex catheter R chest. Decreased breath sounds b/l.  Abdominal:     General: There is no distension.     Palpations: Abdomen is soft.     Tenderness: There is no abdominal tenderness.  Musculoskeletal:        General: No tenderness.  Skin:    General: Skin is warm and dry.  Neurological:     Mental Status: He is alert.  Psychiatric:        Behavior: Behavior normal.        Thought Content: Thought content normal.      ED Treatments / Results  Labs (all labs ordered are listed, but only abnormal results are displayed) Labs Reviewed  CBC WITH DIFFERENTIAL/PLATELET - Abnormal; Notable for the following components:      Result Value   WBC 16.9 (*)    RBC 2.80 (*)    Hemoglobin 8.4 (*)    HCT 28.0 (*)    RDW 21.1 (*)    nRBC 0.4 (*)    Neutro Abs 11.1 (*)    Monocytes Absolute 2.2 (*)    Abs Immature Granulocytes 1.72 (*)    All other components within normal limits  BASIC METABOLIC PANEL - Abnormal; Notable for the following components:   Glucose, Bld 141 (*)    Calcium 8.5 (*)    All other components within normal limits  URINALYSIS, ROUTINE W REFLEX MICROSCOPIC - Abnormal; Notable for the following components:   Hgb urine dipstick SMALL (*)    Leukocytes,Ua TRACE (*)    Bacteria, UA RARE (*)    All other components within normal limits  SARS CORONAVIRUS 2 (TAT 6-24 HRS)    EKG EKG  Interpretation  Date/Time:  Monday September 12 2019 09:26:59 EDT Ventricular Rate:  83 PR Interval:    QRS Duration: 123 QT Interval:  406 QTC Calculation: 478 R Axis:   16 Text Interpretation:  Sinus rhythm Left bundle branch block Confirmed by Virgel Manifold [phone]) on 09/12/2019 10:50:18 AM   Radiology Ct Angio Chest Pe W And/or Wo Contrast  Result Date: 09/12/2019 CLINICAL DATA:  Shortness of breath with cough and weakness on exertion for 3 days. History of lung cancer. Clinical suspicion of pulmonary embolism. EXAM: CT ANGIOGRAPHY CHEST WITH CONTRAST TECHNIQUE: Multidetector CT imaging of the chest was performed using the standard protocol during bolus administration of intravenous contrast. Multiplanar CT image reconstructions and MIPs were obtained to evaluate the vascular anatomy. CONTRAST:  120mL OMNIPAQUE IOHEXOL 350 MG/ML SOLN COMPARISON:  Chest CT 08/21/2019. FINDINGS: Cardiovascular: The pulmonary arteries are well opacified with contrast to the level of the subsegmental branches. There is a small linear filling defect within a right lower lobe segmental branch (image 71/7), unchanged from the previous study and potentially related to previous pulmonary embolism. No evidence of acute pulmonary embolism. There is diffuse atherosclerosis of the aorta, great vessels and coronary arteries status post  median sternotomy and CABG. There are calcifications of the aortic valve. Right IJ Port-A-Cath extends to the superior cavoatrial junction. The heart is mildly enlarged. No significant pericardial effusion. Mediastinum/Nodes: Interval development of mildly enlarged mediastinal lymph nodes, including an 11 mm left paratracheal node on image 24/7, a 17 mm AP window node on image 37/7 and a 13 mm subcarinal node on image 51/7. The thyroid gland, trachea and esophagus demonstrate no significant findings. Lungs/Pleura: Right pleural drain remains in place. The small right-sided pleural effusion is  unchanged in volume. There is a new small dependent left pleural effusion. There are pleural calcifications bilaterally. There are new patchy perihilar ground-glass opacities within the left upper and lower lobes. Mild dependent atelectasis at both lung bases is stable. There is no confluent airspace opacity or suspicious nodularity. Upper abdomen: The visualized upper abdomen appears stable without significant findings. Musculoskeletal/Chest wall: There is no chest wall mass or suspicious osseous finding. Stable nonunion of the previous median sternotomy without bone destruction or diastasis. Review of the MIP images confirms the above findings. IMPRESSION: 1. No evidence of acute pulmonary embolism. There is a stable small linear filling defect within a right lower lobe pulmonary arterial branch which may be related to remote pulmonary embolism. 2. New patchy perihilar ground-glass opacities on the left and left pleural effusion, potentially inflammatory. No new findings in the right hemithorax. 3. New mildly enlarged mediastinal lymph nodes, likely reactive. 4. Extensive coronary and Aortic Atherosclerosis (ICD10-I70.0). Electronically Signed   By: Richardean Sale M.D.   On: 09/12/2019 12:11    Procedures Procedures (including critical care time)  Medications Ordered in ED Medications  iohexol (OMNIPAQUE) 350 MG/ML injection 100 mL (100 mLs Intravenous Contrast Given 09/12/19 1131)     Initial Impression / Assessment and Plan / ED Course  I have reviewed the triage vital signs and the nursing notes.  Pertinent labs & imaging results that were available during my care of the patient were reviewed by me and considered in my medical decision making (see chart for details).        85yM with dyspnea. He does have a leukocytosis but this may reflect neupogen injection just a few days ago. Afebrile rectally. I turned supplemental o2 off and sats have been good here in the ED. 350cc drained from  pleurex. CTa w/o evidence of new clot burden. Has already been restarted on xarelto. H/H stable.  Will cover with abx for possible infectious process. I think he is fine for outpt tx at this point though.   Final Clinical Impressions(s) / ED Diagnoses   Final diagnoses:  Dyspnea, unspecified type  HCAP (healthcare-associated pneumonia)    ED Discharge Orders    None       Virgel Manifold, MD 09/15/19 1306

## 2019-09-12 NOTE — ED Notes (Signed)
pleurex catheter drained.  350 of serosanguinous drainage. VSS. Pt c/o of no pain

## 2019-09-12 NOTE — ED Notes (Signed)
Family at bedside. 

## 2019-09-13 ENCOUNTER — Inpatient Hospital Stay: Payer: Medicare Other

## 2019-09-13 ENCOUNTER — Other Ambulatory Visit: Payer: Self-pay

## 2019-09-13 ENCOUNTER — Encounter: Payer: Self-pay | Admitting: Internal Medicine

## 2019-09-13 ENCOUNTER — Inpatient Hospital Stay (HOSPITAL_BASED_OUTPATIENT_CLINIC_OR_DEPARTMENT_OTHER): Payer: Medicare Other | Admitting: Internal Medicine

## 2019-09-13 ENCOUNTER — Other Ambulatory Visit: Payer: Self-pay | Admitting: *Deleted

## 2019-09-13 ENCOUNTER — Telehealth: Payer: Self-pay | Admitting: Primary Care

## 2019-09-13 VITALS — BP 116/71 | HR 79 | Temp 98.0°F | Resp 18 | Ht 72.0 in | Wt 199.9 lb

## 2019-09-13 VITALS — BP 115/72 | HR 82 | Temp 98.5°F | Resp 17

## 2019-09-13 DIAGNOSIS — T451X5A Adverse effect of antineoplastic and immunosuppressive drugs, initial encounter: Secondary | ICD-10-CM

## 2019-09-13 DIAGNOSIS — D539 Nutritional anemia, unspecified: Secondary | ICD-10-CM

## 2019-09-13 DIAGNOSIS — C450 Mesothelioma of pleura: Secondary | ICD-10-CM

## 2019-09-13 DIAGNOSIS — R0602 Shortness of breath: Secondary | ICD-10-CM

## 2019-09-13 DIAGNOSIS — D6481 Anemia due to antineoplastic chemotherapy: Secondary | ICD-10-CM

## 2019-09-13 DIAGNOSIS — E039 Hypothyroidism, unspecified: Secondary | ICD-10-CM | POA: Diagnosis not present

## 2019-09-13 DIAGNOSIS — Z5111 Encounter for antineoplastic chemotherapy: Secondary | ICD-10-CM

## 2019-09-13 DIAGNOSIS — E860 Dehydration: Secondary | ICD-10-CM

## 2019-09-13 DIAGNOSIS — C9000 Multiple myeloma not having achieved remission: Secondary | ICD-10-CM

## 2019-09-13 LAB — CBC WITH DIFFERENTIAL (CANCER CENTER ONLY)
Abs Immature Granulocytes: 1.18 10*3/uL — ABNORMAL HIGH (ref 0.00–0.07)
Basophils Absolute: 0.1 10*3/uL (ref 0.0–0.1)
Basophils Relative: 1 %
Eosinophils Absolute: 0 10*3/uL (ref 0.0–0.5)
Eosinophils Relative: 0 %
HCT: 27.6 % — ABNORMAL LOW (ref 39.0–52.0)
Hemoglobin: 8.6 g/dL — ABNORMAL LOW (ref 13.0–17.0)
Immature Granulocytes: 8 %
Lymphocytes Relative: 20 %
Lymphs Abs: 3.1 10*3/uL (ref 0.7–4.0)
MCH: 30 pg (ref 26.0–34.0)
MCHC: 31.2 g/dL (ref 30.0–36.0)
MCV: 96.2 fL (ref 80.0–100.0)
Monocytes Absolute: 3.4 10*3/uL — ABNORMAL HIGH (ref 0.1–1.0)
Monocytes Relative: 22 %
Neutro Abs: 7.6 10*3/uL (ref 1.7–7.7)
Neutrophils Relative %: 49 %
Platelet Count: 210 10*3/uL (ref 150–400)
RBC: 2.87 MIL/uL — ABNORMAL LOW (ref 4.22–5.81)
RDW: 21 % — ABNORMAL HIGH (ref 11.5–15.5)
WBC Count: 15.4 10*3/uL — ABNORMAL HIGH (ref 4.0–10.5)
nRBC: 0.5 % — ABNORMAL HIGH (ref 0.0–0.2)

## 2019-09-13 LAB — ABO/RH: ABO/RH(D): A POS

## 2019-09-13 LAB — CMP (CANCER CENTER ONLY)
ALT: 10 U/L (ref 0–44)
AST: 15 U/L (ref 15–41)
Albumin: 2.8 g/dL — ABNORMAL LOW (ref 3.5–5.0)
Alkaline Phosphatase: 90 U/L (ref 38–126)
Anion gap: 10 (ref 5–15)
BUN: 15 mg/dL (ref 8–23)
CO2: 27 mmol/L (ref 22–32)
Calcium: 8.9 mg/dL (ref 8.9–10.3)
Chloride: 104 mmol/L (ref 98–111)
Creatinine: 0.99 mg/dL (ref 0.61–1.24)
GFR, Est AFR Am: >60 mL/min (ref >60)
GFR, Estimated: >60 mL/min (ref >60)
Glucose, Bld: 110 mg/dL — ABNORMAL HIGH (ref 70–99)
Potassium: 4.1 mmol/L (ref 3.5–5.1)
Sodium: 141 mmol/L (ref 135–145)
Total Bilirubin: 0.4 mg/dL (ref 0.3–1.2)
Total Protein: 6.7 g/dL (ref 6.5–8.1)

## 2019-09-13 LAB — PREPARE RBC (CROSSMATCH)

## 2019-09-13 LAB — SAMPLE TO BLOOD BANK

## 2019-09-13 MED ORDER — ACETAMINOPHEN 325 MG PO TABS
ORAL_TABLET | ORAL | Status: AC
  Filled 2019-09-13: qty 2

## 2019-09-13 MED ORDER — SODIUM CHLORIDE 0.9% FLUSH
10.0000 mL | INTRAVENOUS | Status: AC | PRN
  Filled 2019-09-13: qty 10

## 2019-09-13 MED ORDER — DIPHENHYDRAMINE HCL 25 MG PO CAPS
25.0000 mg | ORAL_CAPSULE | Freq: Once | ORAL | Status: AC
  Administered 2019-09-13: 25 mg via ORAL

## 2019-09-13 MED ORDER — SODIUM CHLORIDE 0.9% IV SOLUTION
250.0000 mL | Freq: Once | INTRAVENOUS | Status: AC
  Filled 2019-09-13: qty 250

## 2019-09-13 MED ORDER — ACETAMINOPHEN 325 MG PO TABS
650.0000 mg | ORAL_TABLET | Freq: Once | ORAL | Status: AC
  Administered 2019-09-13: 650 mg via ORAL

## 2019-09-13 MED ORDER — DIPHENHYDRAMINE HCL 25 MG PO CAPS: 25.0000 mg | ORAL_CAPSULE | Freq: Once | ORAL | Status: DC

## 2019-09-13 MED ORDER — SODIUM CHLORIDE 0.9% FLUSH
3.0000 mL | INTRAVENOUS | Status: AC | PRN
  Filled 2019-09-13: qty 10

## 2019-09-13 MED ORDER — HEPARIN SOD (PORK) LOCK FLUSH 100 UNIT/ML IV SOLN
500.0000 [IU] | Freq: Every day | INTRAVENOUS | Status: AC | PRN
  Administered 2019-09-13: 14:00:00 500 [IU]
  Filled 2019-09-13: qty 5

## 2019-09-13 MED ORDER — HEPARIN SOD (PORK) LOCK FLUSH 100 UNIT/ML IV SOLN
250.0000 [IU] | INTRAVENOUS | Status: AC | PRN
  Filled 2019-09-13: qty 5

## 2019-09-13 MED ORDER — SODIUM CHLORIDE 0.9 % IV SOLN
Freq: Once | INTRAVENOUS | Status: AC
  Administered 2019-09-13: 10:00:00 via INTRAVENOUS
  Filled 2019-09-13: qty 250

## 2019-09-13 MED ORDER — SODIUM CHLORIDE 0.9 % IV SOLN
1000.0000 mL | INTRAVENOUS | Status: DC
  Filled 2019-09-13: qty 1000

## 2019-09-13 MED ORDER — SODIUM CHLORIDE 0.9% FLUSH
10.0000 mL | INTRAVENOUS | Status: AC | PRN
  Administered 2019-09-13: 14:00:00 10 mL
  Filled 2019-09-13: qty 10

## 2019-09-13 MED ORDER — ACETAMINOPHEN 325 MG PO TABS: 650.0000 mg | ORAL_TABLET | Freq: Once | ORAL | Status: DC

## 2019-09-13 MED ORDER — FLUCONAZOLE 100 MG PO TABS: 100.0000 mg | ORAL_TABLET | Freq: Every day | ORAL | 0 refills | Status: DC

## 2019-09-13 MED ORDER — HEPARIN SOD (PORK) LOCK FLUSH 100 UNIT/ML IV SOLN
500.0000 [IU] | Freq: Every day | INTRAVENOUS | Status: AC | PRN
  Filled 2019-09-13: qty 5

## 2019-09-13 MED ORDER — DIPHENHYDRAMINE HCL 25 MG PO CAPS: 25.0000 mg | ORAL_CAPSULE | Freq: Once | ORAL | Status: AC

## 2019-09-13 MED ORDER — ACETAMINOPHEN 325 MG PO TABS: 650.0000 mg | ORAL_TABLET | Freq: Once | ORAL | Status: AC

## 2019-09-13 MED ORDER — DIPHENHYDRAMINE HCL 25 MG PO CAPS
ORAL_CAPSULE | ORAL | Status: AC
  Filled 2019-09-13: qty 1

## 2019-09-13 NOTE — Telephone Encounter (Signed)
-----   Message from Curt Bears, MD sent at 09/13/2019  4:26 PM EDT ----- Regarding: RE: Sputum grew out moderate yeast Yes. Thank you. ----- Message ----- From: Martyn Ehrich, NP Sent: 09/13/2019   2:10 PM EDT To: Curt Bears, MD Subject: Sputum grew out moderate yeast                 Jeffrey [name] sputum grew moderate yeast, are you ok with me giving him a course of fluconazole 100mg  x 7 days. No interactions found with carboplatin.   Jeffrey Barrow, NP [facility]

## 2019-09-13 NOTE — Progress Notes (Signed)
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  PRIOR THERAPY: None.  CURRENT THERAPY: Systemic chemotherapy with carboplatin for AUC of 5 and Alimta 500 mg/M2 every 3 weeks.  Status post 3 cycles.  INTERVAL HISTORY: Jeffrey [name] 85 y.o. male returns to the clinic today for follow-up visit.  The patient has been complaining of shortness of breath and weakness recently.  He was seen by his primary care physician yesterday and sent to the emergency department for evaluation.  CT angiogram of the chest performed yesterday showed no evidence of acute pulmonary embolism but there was new patchy perihilar groundglass opacity on the left and left pleural effusion suspicious to be inflammatory in origin.  There was also new mildly enlarged mediastinal lymph nodes likely reactive secondary to the inflammation.  The patient was supposed to start cycle #4 of his chemotherapy today.  He is here for evaluation and recommendation regarding his condition.  He was started yesterday on Levaquin for the suspicious pneumonia.  He denied having any current fever or chills.  He has no nausea, vomiting, diarrhea or constipation.  He denied having any headache or visual changes.  He completed a course of intravenous antibiotic for urinary tract infection recently.   MEDICAL HISTORY: Past Medical History:  Diagnosis Date   Anxiety    Aortic stenosis    mild AS by 07/20/13 echo ([facility])   Arthritis    "all over"   Coronary artery disease    NSTEMI 06/2010, CABG x3=> [place]->LAD, SVG->OM1, SVG->PDA, DES LCx 10/2011, DES LAD 12/2011   Depression    GERD (gastroesophageal reflux  disease)    H/O hiatal hernia    Hearing aid worn    B/L   High cholesterol    History of blood transfusion reaction 23-Mar-2013   "he almost died; he has to get irradiated blood next time"   Hypertension    Hypothyroidism    Ischemic cardiomyopathy    ITP (idiopathic thrombocytopenic purpura)    Dr. Gaynelle Arabian, on Promacta   Myocardial infarction [facility]) Mar 23, 2010   Pneumonia 1940's X 1; 03/23/14 X 1   Rheumatoid arthritis ([place])    Shortness of breath    with exertion, has not been very active   Sleep apnea    Wears glasses    Wears partial dentures     ALLERGIES:  is allergic to lipitor [atorvastatin]; methylprednisolone; morphine and related; other; sulfa antibiotics; ciprofloxacin; and doxycycline.  MEDICATIONS:  Current Outpatient Medications  Medication Sig Dispense Refill   albuterol (VENTOLIN HFA) 108 (90 Base) MCG/ACT inhaler Inhale 1 puff into the lungs 2 (two) times daily as needed for shortness of breath.     ALPRAZolam (XANAX) 0.25 MG tablet Take 1 tablet (0.25 mg total) by mouth 2 (two) times daily as needed for anxiety. 20 tablet 0   benzonatate (TESSALON) 100 MG capsule Take 1 capsule (100 mg total) by mouth every 8 (eight) hours. 21 capsule 0   dexamethasone (DECADRON) 4 MG tablet 4 mg p.o. twice daily the day before, day of and day after chemotherapy every 3 weeks 40 tablet 1   Fluticasone-Salmeterol (ADVAIR) 100-50 MCG/DOSE AEPB Inhale 1 puff into the lungs 2 (two) times daily.  folic acid (FOLVITE) 1 MG tablet Take 1 tablet (1 mg total) by mouth daily. 30 tablet 4   guaiFENesin (MUCINEX) 600 MG 12 hr tablet Take 1 tablet (600 mg total) by mouth 2 (two) times daily. 60 tablet 1   ipratropium-albuterol (DUONEB) 0.5-2.5 (3) MG/3ML SOLN Take 3 mLs by nebulization every 6 (six) hours as needed. J44.9 (Patient taking differently: Take 3 mLs by nebulization every 6 (six) hours as needed (shortness of breath). J44.9) 360 mL 3   levofloxacin (LEVAQUIN) 500 MG tablet  Take 1 tablet (500 mg total) by mouth daily. 7 tablet 0   levofloxacin (LEVAQUIN) 500 MG tablet Take 1 tablet (500 mg total) by mouth daily. 7 tablet 0   levothyroxine (SYNTHROID, LEVOTHROID) 200 MCG tablet Take 200 mcg by mouth See admin instructions. Take 1 tablet Monday through Friday - None on Saturday or Sunday.     lidocaine-prilocaine (EMLA) cream Apply 1 application topically as needed. 30 g 0   methotrexate (RHEUMATREX) 2.5 MG tablet Take 15 mg by mouth once a week. Caution:Chemotherapy. Protect from light.     mirtazapine (REMERON) 15 MG tablet Take 15 mg by mouth at bedtime.     nystatin (MYCOSTATIN) 100000 UNIT/ML suspension Take 5 mLs by mouth 4 (four) times daily.     Omega-3 Fatty Acids (FISH OIL) 1200 MG CAPS Take 1,200 mg by mouth at bedtime. 360 MG OMEGA-3     omeprazole (PRILOSEC) 40 MG capsule Take 40 mg by mouth at bedtime.      predniSONE (DELTASONE) 10 MG tablet Take 10 mg by mouth daily with breakfast.     prochlorperazine (COMPAZINE) 10 MG tablet Take 1 tablet (10 mg total) by mouth every 6 (six) hours as needed for nausea or vomiting. 30 tablet 0   Vitamin D, Ergocalciferol, (DRISDOL) 1.25 MG (50000 UT) CAPS capsule Take 50,000 Units by mouth every 7 (seven) days.     No current facility-administered medications for this visit.     SURGICAL HISTORY:  Past Surgical History:  Procedure Laterality Date   APPENDECTOMY     BACK SURGERY     CATARACT EXTRACTION, BILATERAL     CHEST TUBE INSERTION Right 05/20/2019   Procedure: INSERTION PLEURAL DRAINAGE CATHETER;  Surgeon: Ivin Poot, MD;  Location: [place];  Service: Thoracic;  Laterality: Right;   COLONOSCOPY     CORONARY ANGIOPLASTY WITH STENT PLACEMENT     DES Lcx 10/2011, DES LAD 12/2011   CORONARY ARTERY BYPASS GRAFT  2011   "CABG X3"   CORONARY STENT INTERVENTION N/A 05/26/2019   Procedure: CORONARY STENT INTERVENTION;  Surgeon: Nelva Bush, MD;  Location: [place] [facility];  Service:  Cardiovascular;  Laterality: N/A;   GASTROC RECESSION EXTREMITY Right 07/06/2015   Pasty Spillers (orthopedics- [place], [place])   HERNIA REPAIR     HIATAL HERNIA REPAIR     IR IMAGING GUIDED PORT INSERTION  06/29/2019   KNEE ARTHROSCOPY Left 06/22/2014   w/I&D   KNEE ARTHROSCOPY Left 06/22/2014   Procedure: IRRIGATION AND DEBRIDEMENT WITH CHRONDROPLASTY;  Surgeon: Hessie Dibble, MD;  Location: [place];  Service: Orthopedics;  Laterality: Left;   LEFT HEART CATH AND CORS/GRAFTS ANGIOGRAPHY N/A 05/26/2019   Procedure: LEFT HEART CATH AND CORS/GRAFTS ANGIOGRAPHY;  Surgeon: Nelva Bush, MD;  Location: [place] [facility];  Service: Cardiovascular;  Laterality: N/A;   LUMBAR DISC SURGERY  1960's?   MULTIPLE TOOTH EXTRACTIONS     PLEURAL BIOPSY Right 05/20/2019   Procedure: PLEURAL BIOPSY;  Surgeon: Prescott Gum, Collier Salina, MD;  Location: [facility];  Service: Thoracic;  Laterality: Right;   SHOULDER ARTHROSCOPY Right 04/09/2017   Procedure: ARTHROSCOPY SHOULDER;  Surgeon: Melrose Nakayama, MD;  Location: [place];  Service: Orthopedics;  Laterality: Right;   STERNAL CLOSURE     "wires from OHS taken out; plate put in" (12/24/2438)   SYNOVECTOMY Left 08/17/2014   Procedure: SYNOVECTOMY LEFT KNEE;  Surgeon: Hessie Dibble, MD;  Location: [place];  Service: Orthopedics;  Laterality: Left;   TOTAL ANKLE ARTHROPLASTY Right 07/06/2015   Pasty Spillers (orthopedics- [place])   TOTAL HIP ARTHROPLASTY Right 06/29/2018   Procedure: RIGHT TOTAL HIP ARTHROPLASTY ANTERIOR APPROACH;  Surgeon: Melrose Nakayama, MD;  Location: [facility];  Service: Orthopedics;  Laterality: Right;   TOTAL HIP ARTHROPLASTY Left 06/09/2016   Nicki Reaper Streater Claiborne Billings (orthopedics- [facility])   TOTAL KNEE ARTHROPLASTY Left 11/24/2014   Procedure: TOTAL KNEE ARTHROPLASTY;  Surgeon: Hessie Dibble, MD;  Location: [place];  Service: Orthopedics;  Laterality: Left;   VIDEO ASSISTED THORACOSCOPY Right 05/20/2019   Procedure: VIDEO ASSISTED THORACOSCOPY;   Surgeon: Prescott Gum, Collier Salina, MD;  Location: [place];  Service: Thoracic;  Laterality: Right;    REVIEW OF SYSTEMS:  Constitutional: positive for fatigue Eyes: negative Ears, nose, mouth, throat, and face: negative Respiratory: positive for cough and dyspnea on exertion Cardiovascular: negative Gastrointestinal: negative Genitourinary:negative Integument/breast: negative Hematologic/lymphatic: negative Musculoskeletal:positive for muscle weakness Neurological: negative Behavioral/Psych: negative Endocrine: negative Allergic/Immunologic: negative   PHYSICAL EXAMINATION: General appearance: alert, cooperative, fatigued and no distress Head: Normocephalic, without obvious abnormality, atraumatic Neck: no adenopathy, no JVD, supple, symmetrical, trachea midline and thyroid not enlarged, symmetric, no tenderness/mass/nodules Lymph nodes: Cervical, supraclavicular, and axillary nodes normal. Resp: clear to auscultation bilaterally Back: symmetric, no curvature. ROM normal. No CVA tenderness. Cardio: systolic murmur: systolic ejection 3/6, harsh at 2nd left intercostal space GI: soft, non-tender; bowel sounds normal; no masses,  no organomegaly Extremities: extremities normal, atraumatic, no cyanosis or edema Neurologic: Alert and oriented X 3, normal strength and tone. Normal symmetric reflexes. Normal coordination and gait  ECOG PERFORMANCE STATUS: 1 - Symptomatic but completely ambulatory  Blood pressure 116/71, pulse 79, temperature 98 F (36.7 C), temperature source Temporal, resp. rate 18, height 6' (1.829 m), weight 199 lb 14.4 oz (90.7 kg), SpO2 98 %.  LABORATORY DATA: Lab Results  Component Value Date   WBC 15.4 (H) 09/13/2019   HGB 8.6 (L) 09/13/2019   HCT 27.6 (L) 09/13/2019   MCV 96.2 09/13/2019   PLT 210 09/13/2019      Chemistry      Component Value Date/Time   NA 140 09/12/2019 1030   K 4.1 09/12/2019 1030   CL 106 09/12/2019 1030   CO2 26 09/12/2019 1030   BUN  15 09/12/2019 1030   CREATININE 0.87 09/12/2019 1030   CREATININE 1.02 09/06/2019 0947   CREATININE 1.79 (H) 10/12/2014 1136      Component Value Date/Time   CALCIUM 8.5 (L) 09/12/2019 1030   ALKPHOS 64 09/06/2019 0947   AST 15 09/06/2019 0947   ALT 11 09/06/2019 0947   BILITOT <0.2 (L) 09/06/2019 0947       RADIOGRAPHIC STUDIES: Dg Chest 2 View  Result Date: 09/07/2019 CLINICAL DATA:  Cough and short of breath EXAM: CHEST - 2 VIEW COMPARISON:  08/20/2019 FINDINGS: Post sternotomy changes. Right-sided central venous port tip over the distal SVC. Right pleural effusion or thickening, no change with stable mild airspace disease at the right base.  The left lung is grossly clear. Enlarged cardiomediastinal silhouette with aortic atherosclerosis. No pneumothorax. A right basilar pleural drainage catheter remains in place. IMPRESSION: No significant change in small right-sided pleural effusion or thickening and mild right basilar airspace disease. No acute interval change as compared with 08/20/2019. Electronically Signed   By: Donavan Foil M.D.   On: 09/07/2019 16:17   Dg Chest 2 View  Result Date: 08/17/2019 CLINICAL DATA:  Pleural effusion EXAM: CHEST - 2 VIEW COMPARISON:  08/05/2019 FINDINGS: Prior median sternotomy. Right-sided chest port remains in place. Small caliber right pleural drainage catheter. Small loculated right sided pleural effusion with associated right basilar opacity is not significantly changed from prior. Left lung remains clear. No pneumothorax. IMPRESSION: Persistent loculated right-sided pleural effusion, not significantly changed from prior. Electronically Signed   By: Davina Poke M.D.   On: 08/17/2019 14:50   Ct Angio Chest Pe W And/or Wo Contrast  Result Date: 09/12/2019 CLINICAL DATA:  Shortness of breath with cough and weakness on exertion for 3 days. History of lung cancer. Clinical suspicion of pulmonary embolism. EXAM: CT ANGIOGRAPHY CHEST WITH CONTRAST  TECHNIQUE: Multidetector CT imaging of the chest was performed using the standard protocol during bolus administration of intravenous contrast. Multiplanar CT image reconstructions and MIPs were obtained to evaluate the vascular anatomy. CONTRAST:  138mL OMNIPAQUE IOHEXOL 350 MG/ML SOLN COMPARISON:  Chest CT 08/21/2019. FINDINGS: Cardiovascular: The pulmonary arteries are well opacified with contrast to the level of the subsegmental branches. There is a small linear filling defect within a right lower lobe segmental branch (image 71/7), unchanged from the previous study and potentially related to previous pulmonary embolism. No evidence of acute pulmonary embolism. There is diffuse atherosclerosis of the aorta, great vessels and coronary arteries status post median sternotomy and CABG. There are calcifications of the aortic valve. Right IJ Port-A-Cath extends to the superior cavoatrial junction. The heart is mildly enlarged. No significant pericardial effusion. Mediastinum/Nodes: Interval development of mildly enlarged mediastinal lymph nodes, including an 11 mm left paratracheal node on image 24/7, a 17 mm AP window node on image 37/7 and a 13 mm subcarinal node on image 51/7. The thyroid gland, trachea and esophagus demonstrate no significant findings. Lungs/Pleura: Right pleural drain remains in place. The small right-sided pleural effusion is unchanged in volume. There is a new small dependent left pleural effusion. There are pleural calcifications bilaterally. There are new patchy perihilar ground-glass opacities within the left upper and lower lobes. Mild dependent atelectasis at both lung bases is stable. There is no confluent airspace opacity or suspicious nodularity. Upper abdomen: The visualized upper abdomen appears stable without significant findings. Musculoskeletal/Chest wall: There is no chest wall mass or suspicious osseous finding. Stable nonunion of the previous median sternotomy without bone  destruction or diastasis. Review of the MIP images confirms the above findings. IMPRESSION: 1. No evidence of acute pulmonary embolism. There is a stable small linear filling defect within a right lower lobe pulmonary arterial branch which may be related to remote pulmonary embolism. 2. New patchy perihilar ground-glass opacities on the left and left pleural effusion, potentially inflammatory. No new findings in the right hemithorax. 3. New mildly enlarged mediastinal lymph nodes, likely reactive. 4. Extensive coronary and Aortic Atherosclerosis (ICD10-I70.0). Electronically Signed   By: Richardean Sale M.D.   On: 09/12/2019 12:11   Ct Angio Chest Pe W/cm &/or Wo Cm  Result Date: 08/21/2019 CLINICAL DATA:  85 year old male with shortness of breath with concern for pulmonary embolism. EXAM:  CT ANGIOGRAPHY CHEST WITH CONTRAST TECHNIQUE: Multidetector CT imaging of the chest was performed using the standard protocol during bolus administration of intravenous contrast. Multiplanar CT image reconstructions and MIPs were obtained to evaluate the vascular anatomy. CONTRAST:  138mL OMNIPAQUE IOHEXOL 350 MG/ML SOLN COMPARISON:  Chest CT dated 08/05/2019 FINDINGS: Cardiovascular: There is mild cardiomegaly. No pericardial effusion. Three-vessel coronary vascular calcification. Advanced calcified and noncalcified plaque of the thoracic aorta. Evaluation of the pulmonary arteries is limited due to respiratory motion artifact and suboptimal visualization of the distal branches. No large or central pulmonary artery embolus identified. Right-sided Port-A-Cath with tip close to the cavoatrial junction. Mediastinum/Nodes: No hilar or mediastinal adenopathy. The esophagus is grossly unremarkable. No mediastinal fluid collection. Lungs/Pleura: There is a small right pleural effusion with right lower lobe compressive atelectasis although pneumonia is not excluded. A pleural catheter is noted with tip in the medial right lung base  pleural surface. There is no pneumothorax. The central airways are patent. Upper Abdomen: Cirrhosis. The visualized upper abdomen is otherwise unremarkable. Musculoskeletal: Median sternotomy wires. Multilevel degenerative changes of the spine. No acute osseous pathology. Review of the MIP images confirms the above findings. IMPRESSION: 1. No CT evidence of central pulmonary artery embolus. 2. Small right pleural effusion with right lower lobe compressive atelectasis. Pneumonia is not excluded. 3. Cirrhosis. 4. Aortic Atherosclerosis (ICD10-I70.0). Electronically Signed   By: Anner Crete M.D.   On: 08/21/2019 01:26   Dg Chest Port 1 View  Result Date: 08/20/2019 CLINICAL DATA:  Dyspnea EXAM: PORTABLE CHEST 1 VIEW COMPARISON:  August 19, 2019 FINDINGS: The right-sided Port-A-Cath is stable in positioning. There is a persistent small right-sided pleural effusion. No pneumothorax. There is an unchanged airspace opacity at the right lung base. The heart size is stable. The patient is status post prior median sternotomy. IMPRESSION: No significant interval change. Persistent right-sided pleural effusion. Electronically Signed   By: Constance Holster M.D.   On: 08/20/2019 23:09   Dg Chest Portable 1 View  Result Date: 08/19/2019 CLINICAL DATA:  Shortness of breath and cough for 2 weeks. This is worsened over the last 2 days. EXAM: PORTABLE CHEST 1 VIEW COMPARISON:  08/17/2019 FINDINGS: Power injectable Port-A-Cath tip: SVC. Prior CABG. Moderate enlargement of the cardiopericardial silhouette with chronic blunting of the right lateral costophrenic angle and indistinct opacity along the right hemidiaphragm. There is likely still a pleural drainage catheter at the right lung base although this is difficult to see because of all of the superimposed ECG lead wires. Upper zone pulmonary vascular prominence probably reflects pulmonary venous hypertension and is increased from 08/17/2019. IMPRESSION: 1. Moderate  enlargement of the cardiopericardial silhouette with continued small right pleural effusion and indistinct opacity at the right lung base. I suspect that the indwelling pleural drainage catheter still present although there many superimposed ECG lead wires in this vicinity making identification of the pleural drainage catheter difficult. 2. Increased cephalization of blood flow suggesting pulmonary venous hypertension. 3. Prior CABG. Electronically Signed   By: Van Clines M.D.   On: 08/19/2019 18:28    ASSESSMENT AND PLAN: This is a very pleasant 85 years old white male with malignant pleural mesothelioma, epithelioid type involving the right hemithorax. The patient started systemic chemotherapy with carboplatin for AUC of 5 and Alimta 500 mg/M2 status post 3 cycles.   The patient has been tolerating this treatment well except for increasing fatigue and weakness. He was recently treated for urinary tract infection with IV antibiotics. He was seen  at the emergency department yesterday for questionable left lung pneumonia and the patient is currently undergoing treatment with Levaquin. I had a lengthy discussion with the patient and his wife who was available by phone today about his current condition and treatment options. I recommended for the patient to delay the start of cycle #4 by at least 1 week until improvement of his condition. For the dehydration, I will arrange for the patient to receive 1 L of normal saline in the clinic today. For the symptomatic chemotherapy-induced anemia, I will arrange for the patient to receive 1 unit of PRBCs transfusion today. For the suspicious pneumonia, he will continue his current treatment with Levaquin. He will come back for follow-up visit in 1 week for evaluation before resuming his chemotherapy next week. The patient was advised to call immediately if he has any concerning symptoms in the interval. The patient voices understanding of current disease  status and treatment options and is in agreement with the current care plan.  All questions were answered. The patient knows to call the clinic with any problems, questions or concerns. We can certainly see the patient much sooner if necessary.  Disclaimer: This note was dictated with voice recognition software. Similar sounding words can inadvertently be transcribed and may not be corrected upon review.

## 2019-09-13 NOTE — Telephone Encounter (Signed)
Please let patient know sputum culture showed moderate yeast. Sending in RX fluconazole 100mg  once daily x 7 days. No interaction with chemotherapy drugs.

## 2019-09-13 NOTE — Patient Instructions (Addendum)
Dehydration, Adult  Dehydration is when there is not enough fluid or water in your body. This happens when you lose more fluids than you take in. Dehydration can range from mild to very bad. It should be treated right away to keep it from getting very bad. Symptoms of mild dehydration may include:  Thirst.  Dry lips.  Slightly dry mouth.  Dry, warm skin.  Dizziness. Symptoms of moderate dehydration may include:  Very dry mouth.  Muscle cramps.  Dark pee (urine). Pee may be the color of tea.  Your body making less pee.  Your eyes making fewer tears.  Heartbeat that is uneven or faster than normal (palpitations).  Headache.  Light-headedness, especially when you stand up from sitting.  Fainting (syncope). Symptoms of very bad dehydration may include:  Changes in skin, such as: ? Cold and clammy skin. ? Blotchy (mottled) or pale skin. ? Skin that does not quickly return to normal after being lightly pinched and let go (poor skin turgor).  Changes in body fluids, such as: ? Feeling very thirsty. ? Your eyes making fewer tears. ? Not sweating when body temperature is high, such as in hot weather. ? Your body making very little pee.  Changes in vital signs, such as: ? Weak pulse. ? Pulse that is more than 100 beats a minute when you are sitting still. ? Fast breathing. ? Low blood pressure.  Other changes, such as: ? Sunken eyes. ? Cold hands and feet. ? Confusion. ? Lack of energy (lethargy). ? Trouble waking up from sleep. ? Short-term weight loss. ? Unconsciousness. Follow these instructions at home:   If told by your doctor, drink an ORS: ? Make an ORS by using instructions on the package. ? Start by drinking small amounts, about  cup (120 mL) every 5-10 minutes. ? Slowly drink more until you have had the amount that your doctor said to have.  Drink enough clear fluid to keep your pee clear or pale yellow. If you were told to drink an ORS, finish the  ORS first, then start slowly drinking clear fluids. Drink fluids such as: ? Water. Do not drink only water by itself. Doing that can make the salt (sodium) level in your body get too low (hyponatremia). ? Ice chips. ? Fruit juice that you have added water to (diluted). ? Low-calorie sports drinks.  Avoid: ? Alcohol. ? Drinks that have a lot of sugar. These include high-calorie sports drinks, fruit juice that does not have water added, and soda. ? Caffeine. ? Foods that are greasy or have a lot of fat or sugar.  Take over-the-counter and prescription medicines only as told by your doctor.  Do not take salt tablets. Doing that can make the salt level in your body get too high (hypernatremia).  Eat foods that have minerals (electrolytes). Examples include bananas, oranges, potatoes, tomatoes, and spinach.  Keep all follow-up visits as told by your doctor. This is important. Contact a doctor if:  You have belly (abdominal) pain that: ? Gets worse. ? Stays in one area (localizes).  You have a rash.  You have a stiff neck.  You get angry or annoyed more easily than normal (irritability).  You are more sleepy than normal.  You have a harder time waking up than normal.  You feel: ? Weak. ? Dizzy. ? Very thirsty.  You have peed (urinated) only a small amount of very dark pee during 6-8 hours. Get help right away if:  You have  symptoms of very bad dehydration.  You cannot drink fluids without throwing up (vomiting).  Your symptoms get worse with treatment.  You have a fever.  You have a very bad headache.  You are throwing up or having watery poop (diarrhea) and it: ? Gets worse. ? Does not go away.  You have blood or something green (bile) in your throw-up.  You have blood in your poop (stool). This may cause poop to look black and tarry.  You have not peed in 6-8 hours.  You pass out (faint).  Your heart rate when you are sitting still is more than 100 beats a  minute.  You have trouble breathing. This information is not intended to replace advice given to you by your health care provider. Make sure you discuss any questions you have with your health care provider. Document Released: 10/04/2009 Document Revised: 11/20/2017 Document Reviewed: 02/01/2016 Elsevier Patient Education  2020 [place].    Blood Transfusion, Adult, Care After This sheet gives you information about how to care for yourself after your procedure. Your doctor may also give you more specific instructions. If you have problems or questions, contact your doctor. Follow these instructions at home:   Take over-the-counter and prescription medicines only as told by your doctor.  Go back to your normal activities as told by your doctor.  Follow instructions from your doctor about how to take care of the area where an IV tube was put into your vein (insertion site). Make sure you: ? Wash your hands with soap and water before you change your bandage (dressing). If there is no soap and water, use hand sanitizer. ? Change your bandage as told by your doctor.  Check your IV insertion site every day for signs of infection. Check for: ? More redness, swelling, or pain. ? More fluid or blood. ? Warmth. ? Pus or a bad smell. Contact a doctor if:  You have more redness, swelling, or pain around the IV insertion site.  You have more fluid or blood coming from the IV insertion site.  Your IV insertion site feels warm to the touch.  You have pus or a bad smell coming from the IV insertion site.  Your pee (urine) turns pink, red, or brown.  You feel weak after doing your normal activities. Get help right away if:  You have signs of a serious allergic or body defense (immune) system reaction, including: ? Itchiness. ? Hives. ? Trouble breathing. ? Anxiety. ? Pain in your chest or lower back. ? Fever, flushing, and chills. ? Fast pulse. ? Rash. ? Watery poop (diarrhea).  ? Throwing up (vomiting). ? Dark pee. ? Serious headache. ? Dizziness. ? Stiff neck. ? Yellow color in your face or the white parts of your eyes (jaundice). Summary  After a blood transfusion, return to your normal activities as told by your doctor.  Every day, check for signs of infection where the IV tube was put into your vein.  Some signs of infection are warm skin, more redness and pain, more fluid or blood, and pus or a bad smell where the needle went in.  Contact your doctor if you feel weak or have any unusual symptoms. This information is not intended to replace advice given to you by your health care provider. Make sure you discuss any questions you have with your health care provider. Document Released: 12/29/2014 Document Revised: 04/14/2018 Document Reviewed: 08/01/2016 Elsevier Patient Education  2020 [employer].

## 2019-09-13 NOTE — Telephone Encounter (Addendum)
ATC pt on (H), line range busy. ATC pt on (M), line went to voicemail. LMTCB x1.  Will hold in triage to f/u on.

## 2019-09-14 ENCOUNTER — Telehealth: Payer: Self-pay | Admitting: Internal Medicine

## 2019-09-14 LAB — TYPE AND SCREEN
ABO/RH(D): A POS
Antibody Screen: NEGATIVE
Unit division: 0

## 2019-09-14 LAB — BPAM RBC
Blood Product Expiration Date: 202010172359
ISSUE DATE / TIME: 202009221210
Unit Type and Rh: 6200

## 2019-09-14 NOTE — Telephone Encounter (Signed)
LMTCB

## 2019-09-14 NOTE — Telephone Encounter (Signed)
Patient wife is returning the call. CB is [phone]

## 2019-09-14 NOTE — Telephone Encounter (Signed)
Scheduled appt per 9/22 los - called pt . Pt wife is aware of appt date and time

## 2019-09-16 NOTE — Telephone Encounter (Signed)
Called and spoke to patient's wife.  Patient has started to medication.  Explained reason for med.  Verbalized understanding. Nothing further needed at this time.

## 2019-09-19 ENCOUNTER — Other Ambulatory Visit: Payer: Self-pay

## 2019-09-19 ENCOUNTER — Inpatient Hospital Stay: Payer: Medicare Other

## 2019-09-19 ENCOUNTER — Encounter: Payer: Self-pay | Admitting: Internal Medicine

## 2019-09-19 ENCOUNTER — Inpatient Hospital Stay (HOSPITAL_BASED_OUTPATIENT_CLINIC_OR_DEPARTMENT_OTHER): Payer: Medicare Other | Admitting: Internal Medicine

## 2019-09-19 VITALS — BP 129/72 | HR 78 | Temp 98.2°F | Resp 17 | Ht 72.0 in | Wt 196.2 lb

## 2019-09-19 DIAGNOSIS — C450 Mesothelioma of pleura: Secondary | ICD-10-CM

## 2019-09-19 DIAGNOSIS — E039 Hypothyroidism, unspecified: Secondary | ICD-10-CM

## 2019-09-19 DIAGNOSIS — Z5111 Encounter for antineoplastic chemotherapy: Secondary | ICD-10-CM | POA: Diagnosis not present

## 2019-09-19 LAB — CMP (CANCER CENTER ONLY)
ALT: 9 U/L (ref 0–44)
AST: 13 U/L — ABNORMAL LOW (ref 15–41)
Albumin: 2.6 g/dL — ABNORMAL LOW (ref 3.5–5.0)
Alkaline Phosphatase: 65 U/L (ref 38–126)
Anion gap: 8 (ref 5–15)
BUN: 17 mg/dL (ref 8–23)
CO2: 26 mmol/L (ref 22–32)
Calcium: 8.9 mg/dL (ref 8.9–10.3)
Chloride: 105 mmol/L (ref 98–111)
Creatinine: 1.18 mg/dL (ref 0.61–1.24)
GFR, Est AFR Am: >60 mL/min (ref >60)
GFR, Estimated: 57 mL/min — ABNORMAL LOW (ref >60)
Glucose, Bld: 186 mg/dL — ABNORMAL HIGH (ref 70–99)
Potassium: 5.3 mmol/L — ABNORMAL HIGH (ref 3.5–5.1)
Sodium: 139 mmol/L (ref 135–145)
Total Bilirubin: 0.3 mg/dL (ref 0.3–1.2)
Total Protein: 7 g/dL (ref 6.5–8.1)

## 2019-09-19 LAB — CBC WITH DIFFERENTIAL (CANCER CENTER ONLY)
Abs Immature Granulocytes: 0.07 10*3/uL (ref 0.00–0.07)
Basophils Absolute: 0 10*3/uL (ref 0.0–0.1)
Basophils Relative: 0 %
Eosinophils Absolute: 0 10*3/uL (ref 0.0–0.5)
Eosinophils Relative: 0 %
HCT: 29.9 % — ABNORMAL LOW (ref 39.0–52.0)
Hemoglobin: 9.4 g/dL — ABNORMAL LOW (ref 13.0–17.0)
Immature Granulocytes: 1 %
Lymphocytes Relative: 12 %
Lymphs Abs: 1 10*3/uL (ref 0.7–4.0)
MCH: 30.3 pg (ref 26.0–34.0)
MCHC: 31.4 g/dL (ref 30.0–36.0)
MCV: 96.5 fL (ref 80.0–100.0)
Monocytes Absolute: 0.4 10*3/uL (ref 0.1–1.0)
Monocytes Relative: 4 %
Neutro Abs: 7 10*3/uL (ref 1.7–7.7)
Neutrophils Relative %: 83 %
Platelet Count: 165 10*3/uL (ref 150–400)
RBC: 3.1 MIL/uL — ABNORMAL LOW (ref 4.22–5.81)
RDW: 18.9 % — ABNORMAL HIGH (ref 11.5–15.5)
WBC Count: 8.5 10*3/uL (ref 4.0–10.5)
nRBC: 0 % (ref 0.0–0.2)

## 2019-09-19 MED ORDER — PALONOSETRON HCL INJECTION 0.25 MG/5ML
INTRAVENOUS | Status: AC
  Filled 2019-09-19: qty 5

## 2019-09-19 MED ORDER — SODIUM CHLORIDE 0.9 % IV SOLN
Freq: Once | INTRAVENOUS | Status: AC
  Administered 2019-09-19: 14:00:00 via INTRAVENOUS
  Filled 2019-09-19: qty 5

## 2019-09-19 MED ORDER — SODIUM CHLORIDE 0.9 % IV SOLN
412.0000 mg/m2 | Freq: Once | INTRAVENOUS | Status: AC
  Administered 2019-09-19: 900 mg via INTRAVENOUS
  Filled 2019-09-19: qty 20

## 2019-09-19 MED ORDER — SODIUM CHLORIDE 0.9% FLUSH
10.0000 mL | INTRAVENOUS | Status: DC | PRN
  Administered 2019-09-19: 15:00:00 10 mL
  Filled 2019-09-19: qty 10

## 2019-09-19 MED ORDER — PALONOSETRON HCL INJECTION 0.25 MG/5ML
0.2500 mg | Freq: Once | INTRAVENOUS | Status: AC
  Administered 2019-09-19: 13:00:00 0.25 mg via INTRAVENOUS

## 2019-09-19 MED ORDER — SODIUM CHLORIDE 0.9 % IV SOLN
343.6000 mg | Freq: Once | INTRAVENOUS | Status: AC
  Administered 2019-09-19: 15:00:00 340 mg via INTRAVENOUS
  Filled 2019-09-19: qty 34

## 2019-09-19 MED ORDER — HEPARIN SOD (PORK) LOCK FLUSH 100 UNIT/ML IV SOLN
500.0000 [IU] | Freq: Once | INTRAVENOUS | Status: AC | PRN
  Administered 2019-09-19: 500 [IU]
  Filled 2019-09-19: qty 5

## 2019-09-19 MED ORDER — SODIUM CHLORIDE 0.9 % IV SOLN
Freq: Once | INTRAVENOUS | Status: AC
  Administered 2019-09-19: 13:00:00 via INTRAVENOUS
  Filled 2019-09-19: qty 250

## 2019-09-19 NOTE — Patient Instructions (Signed)
[facility] Discharge Instructions for Patients Receiving Chemotherapy  Today you received the following chemotherapy agents: Alimta, Carboplatin  To help prevent nausea and vomiting after your treatment, we encourage you to take your nausea medication as directed.   If you develop nausea and vomiting that is not controlled by your nausea medication, call the clinic.   BELOW ARE SYMPTOMS THAT SHOULD BE REPORTED IMMEDIATELY:  *FEVER GREATER THAN 100.5 F  *CHILLS WITH OR WITHOUT FEVER  NAUSEA AND VOMITING THAT IS NOT CONTROLLED WITH YOUR NAUSEA MEDICATION  *UNUSUAL SHORTNESS OF BREATH  *UNUSUAL BRUISING OR BLEEDING  TENDERNESS IN MOUTH AND THROAT WITH OR WITHOUT PRESENCE OF ULCERS  *URINARY PROBLEMS  *BOWEL PROBLEMS  UNUSUAL RASH Items with * indicate a potential emergency and should be followed up as soon as possible.  Feel free to call the clinic should you have any questions or concerns. The clinic phone number is [phone].  Please show the [place] at check-in to the Emergency Department and triage nurse.

## 2019-09-19 NOTE — Progress Notes (Signed)
Per Dr. Julien Nordmann, [place] to treat with all lab results today.

## 2019-09-19 NOTE — Progress Notes (Signed)
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  PRIOR THERAPY: None.  CURRENT THERAPY: Systemic chemotherapy with carboplatin for AUC of 5 and Alimta 500 mg/M2 every 3 weeks.  Status post 3 cycles.  Starting from cycle #4 he will be treated with carboplatin for AUC of 4 and Alimta 400 mg/M2.  INTERVAL HISTORY: Jeffrey [name] 85 y.o. male returns to the clinic today for follow-up visit.  The patient is feeling fine today and much better compared to last week.  He continues to have intermittent cough.  He denied having any current fever or chills.  He has no nausea, vomiting, diarrhea or constipation.  He has no headache or visual changes.  He denied having any current chest pain, shortness of breath except with exertion and no hemoptysis.  He has no fever or chills.  He is here today to resume his systemic chemotherapy with carboplatin and Alimta.   MEDICAL HISTORY: Past Medical History:  Diagnosis Date  . Anxiety   . Aortic stenosis    mild AS by 07/20/13 echo ([facility])  . Arthritis    "all over"  . Coronary artery disease    NSTEMI 06/2010, CABG x3=> [place]->LAD, SVG->OM1, SVG->PDA, DES LCx 10/2011, DES LAD 12/2011  . Depression   . GERD (gastroesophageal reflux disease)   . H/O hiatal hernia   . Hearing aid worn    B/L  . High cholesterol   . History of blood transfusion reaction 03/10/2013   "he almost died; he has to get irradiated blood next time"  . Hypertension   . Hypothyroidism   . Ischemic cardiomyopathy   . ITP (idiopathic thrombocytopenic purpura)    Dr. Gaynelle Arabian, on [place]  . Myocardial infarction ([place]) Mar 10, 2010  . Pneumonia 1940's X 1; 03/10/14 X 1   . Rheumatoid arthritis ([place])   . Shortness of breath    with exertion, has not been very active  . Sleep apnea   . Wears glasses   . Wears partial dentures     ALLERGIES:  is allergic to lipitor [atorvastatin]; methylprednisolone; morphine and related; other; sulfa antibiotics; ciprofloxacin; and doxycycline.  MEDICATIONS:  Current Outpatient Medications  Medication Sig Dispense Refill  . albuterol (VENTOLIN HFA) 108 (90 Base) MCG/ACT inhaler Inhale 1 puff into the lungs 2 (two) times daily as needed for shortness of breath.    . ALPRAZolam (XANAX) 0.25 MG tablet Take 1 tablet (0.25 mg total) by mouth 2 (two) times daily as needed for anxiety. 20 tablet 0  . benzonatate (TESSALON) 100 MG capsule Take 1 capsule (100 mg total) by mouth every 8 (eight) hours. 21 capsule 0  . dexamethasone (DECADRON) 4 MG tablet 4 mg p.o. twice daily the day before, day of and day after chemotherapy every 3 weeks 40 tablet 1  . fluconazole (DIFLUCAN) 100 MG tablet Take 1 tablet (100 mg total) by mouth daily. 7 tablet 0  . Fluticasone-Salmeterol (ADVAIR) 100-50 MCG/DOSE AEPB Inhale 1 puff into the lungs 2 (two) times daily.    . folic acid (FOLVITE) 1 MG tablet Take 1 tablet (1 mg total) by mouth daily. 30 tablet 4  . guaiFENesin (MUCINEX) 600 MG 12 hr tablet Take 1 tablet (600 mg total) by  mouth 2 (two) times daily. 60 tablet 1  . ipratropium-albuterol (DUONEB) 0.5-2.5 (3) MG/3ML SOLN Take 3 mLs by nebulization every 6 (six) hours as needed. J44.9 (Patient taking differently: Take 3 mLs by nebulization every 6 (six) hours as needed (shortness of breath). J44.9) 360 mL 3  . levothyroxine (SYNTHROID, LEVOTHROID) 200 MCG tablet Take 200 mcg by mouth See admin instructions. Take 1 tablet Monday through Friday - None on Saturday or Sunday.    . lidocaine-prilocaine (EMLA) cream Apply 1 application topically as needed. 30 g 0  . mirtazapine (REMERON) 15 MG tablet Take 15 mg by mouth at bedtime.    [name] Kitchen omeprazole  (PRILOSEC) 40 MG capsule Take 40 mg by mouth at bedtime.     . prochlorperazine (COMPAZINE) 10 MG tablet Take 1 tablet (10 mg total) by mouth every 6 (six) hours as needed for nausea or vomiting. 30 tablet 0  . Vitamin D, Ergocalciferol, (DRISDOL) 1.25 MG (50000 UT) CAPS capsule Take 50,000 Units by mouth every 7 (seven) days.     No current facility-administered medications for this visit.     SURGICAL HISTORY:  Past Surgical History:  Procedure Laterality Date  . APPENDECTOMY    . BACK SURGERY    . CATARACT EXTRACTION, BILATERAL    . CHEST TUBE INSERTION Right 05/20/2019   Procedure: INSERTION PLEURAL DRAINAGE CATHETER;  Surgeon: Ivin Poot, MD;  Location: [facility];  Service: Thoracic;  Laterality: Right;  . COLONOSCOPY    . CORONARY ANGIOPLASTY WITH STENT PLACEMENT     DES Lcx 10/2011, DES LAD 12/2011  . CORONARY ARTERY BYPASS GRAFT  2011   "CABG X3"  . CORONARY STENT INTERVENTION N/A 05/26/2019   Procedure: CORONARY STENT INTERVENTION;  Surgeon: Nelva Bush, MD;  Location: [facility];  Service: Cardiovascular;  Laterality: N/A;  . GASTROC RECESSION EXTREMITY Right 07/06/2015   Pasty Spillers (orthopedics- [place], [place])  . HERNIA REPAIR    . HIATAL HERNIA REPAIR    . IR IMAGING GUIDED PORT INSERTION  06/29/2019  . KNEE ARTHROSCOPY Left 06/22/2014   w/I&D  . KNEE ARTHROSCOPY Left 06/22/2014   Procedure: IRRIGATION AND DEBRIDEMENT WITH CHRONDROPLASTY;  Surgeon: Hessie Dibble, MD;  Location: [facility];  Service: Orthopedics;  Laterality: Left;  . LEFT HEART CATH AND CORS/GRAFTS ANGIOGRAPHY N/A 05/26/2019   Procedure: LEFT HEART CATH AND CORS/GRAFTS ANGIOGRAPHY;  Surgeon: Nelva Bush, MD;  Location: [facility];  Service: Cardiovascular;  Laterality: N/A;  . LUMBAR DISC SURGERY  1960's?  . MULTIPLE TOOTH EXTRACTIONS    . PLEURAL BIOPSY Right 05/20/2019   Procedure: PLEURAL BIOPSY;  Surgeon: Ivin Poot, MD;  Location: [facility];  Service: Thoracic;  Laterality: Right;   . SHOULDER ARTHROSCOPY Right 04/09/2017   Procedure: ARTHROSCOPY SHOULDER;  Surgeon: Melrose Nakayama, MD;  Location: [facility];  Service: Orthopedics;  Laterality: Right;  . STERNAL CLOSURE     "wires from OHS taken out; plate put in" (05/25/4033)  . SYNOVECTOMY Left 08/17/2014   Procedure: SYNOVECTOMY LEFT KNEE;  Surgeon: Hessie Dibble, MD;  Location: [facility];  Service: Orthopedics;  Laterality: Left;  . TOTAL ANKLE ARTHROPLASTY Right 07/06/2015   Pasty Spillers ([facility]opedics- [facility])  . TOTAL HIP ARTHROPLASTY Right 06/29/2018   Procedure: RIGHT TOTAL HIP ARTHROPLASTY ANTERIOR APPROACH;  Surgeon: Melrose Nakayama, MD;  Location: [facility];  Service: Orthopedics;  Laterality: Right;  . TOTAL HIP ARTHROPLASTY Left 06/09/2016   Scott Streater Claiborne Billings (orthopedics- [place])  . TOTAL KNEE  ARTHROPLASTY Left 11/24/2014   Procedure: TOTAL KNEE ARTHROPLASTY;  Surgeon: Hessie Dibble, MD;  Location: [facility];  Service: Orthopedics;  Laterality: Left;  [name] Kitchen VIDEO ASSISTED THORACOSCOPY Right 05/20/2019   Procedure: VIDEO ASSISTED THORACOSCOPY;  Surgeon: Prescott Gum, Collier Salina, MD;  Location: [facility];  Service: Thoracic;  Laterality: Right;    REVIEW OF SYSTEMS:  A comprehensive review of systems was negative except for: Constitutional: positive for fatigue Respiratory: positive for cough and dyspnea on exertion   PHYSICAL EXAMINATION: General appearance: alert, cooperative, fatigued and no distress Head: Normocephalic, without obvious abnormality, atraumatic Neck: no adenopathy, no JVD, supple, symmetrical, trachea midline and thyroid not enlarged, symmetric, no tenderness/mass/nodules Lymph nodes: Cervical, supraclavicular, and axillary nodes normal. Resp: clear to auscultation bilaterally Back: symmetric, no curvature. ROM normal. No CVA tenderness. Cardio: systolic murmur: systolic ejection 3/6, harsh at 2nd left intercostal space GI: soft, non-tender; bowel sounds normal; no masses,  no organomegaly Extremities:  extremities normal, atraumatic, no cyanosis or edema  ECOG PERFORMANCE STATUS: 1 - Symptomatic but completely ambulatory  Blood pressure 129/72, pulse 78, temperature 98.2 F (36.8 C), temperature source Temporal, resp. rate 17, height 6' (1.829 m), weight 196 lb 3.2 oz (89 kg), SpO2 100 %.  LABORATORY DATA: Lab Results  Component Value Date   WBC 8.5 09/19/2019   HGB 9.4 (L) 09/19/2019   HCT 29.9 (L) 09/19/2019   MCV 96.5 09/19/2019   PLT 165 09/19/2019      Chemistry      Component Value Date/Time   NA 139 09/19/2019 1139   K 5.3 (H) 09/19/2019 1139   CL 105 09/19/2019 1139   CO2 26 09/19/2019 1139   BUN 17 09/19/2019 1139   CREATININE 1.18 09/19/2019 1139   CREATININE 1.79 (H) 10/12/2014 1136      Component Value Date/Time   CALCIUM 8.9 09/19/2019 1139   ALKPHOS 65 09/19/2019 1139   AST 13 (L) 09/19/2019 1139   ALT 9 09/19/2019 1139   BILITOT 0.3 09/19/2019 1139       RADIOGRAPHIC STUDIES: Dg Chest 2 View  Result Date: 09/07/2019 CLINICAL DATA:  Cough and short of breath EXAM: CHEST - 2 VIEW COMPARISON:  08/20/2019 FINDINGS: Post sternotomy changes. Right-sided central venous port tip over the distal SVC. Right pleural effusion or thickening, no change with stable mild airspace disease at the right base. The left lung is grossly clear. Enlarged cardiomediastinal silhouette with aortic atherosclerosis. No pneumothorax. A right basilar pleural drainage catheter remains in place. IMPRESSION: No significant change in small right-sided pleural effusion or thickening and mild right basilar airspace disease. No acute interval change as compared with 08/20/2019. Electronically Signed   By: Donavan Foil M.D.   On: 09/07/2019 16:17   Ct Angio Chest Pe W And/or Wo Contrast  Result Date: 09/12/2019 CLINICAL DATA:  Shortness of breath with cough and weakness on exertion for 3 days. History of lung cancer. Clinical suspicion of pulmonary embolism. EXAM: CT ANGIOGRAPHY CHEST WITH  CONTRAST TECHNIQUE: Multidetector CT imaging of the chest was performed using the standard protocol during bolus administration of intravenous contrast. Multiplanar CT image reconstructions and MIPs were obtained to evaluate the vascular anatomy. CONTRAST:  188mL OMNIPAQUE IOHEXOL 350 MG/ML SOLN COMPARISON:  Chest CT 08/21/2019. FINDINGS: Cardiovascular: The pulmonary arteries are well opacified with contrast to the level of the subsegmental branches. There is a small linear filling defect within a right lower lobe segmental branch (image 71/7), unchanged from the previous study and potentially related to previous  pulmonary embolism. No evidence of acute pulmonary embolism. There is diffuse atherosclerosis of the aorta, great vessels and coronary arteries status post median sternotomy and CABG. There are calcifications of the aortic valve. Right IJ Port-A-Cath extends to the superior cavoatrial junction. The heart is mildly enlarged. No significant pericardial effusion. Mediastinum/Nodes: Interval development of mildly enlarged mediastinal lymph nodes, including an 11 mm left paratracheal node on image 24/7, a 17 mm AP window node on image 37/7 and a 13 mm subcarinal node on image 51/7. The thyroid gland, trachea and esophagus demonstrate no significant findings. Lungs/Pleura: Right pleural drain remains in place. The small right-sided pleural effusion is unchanged in volume. There is a new small dependent left pleural effusion. There are pleural calcifications bilaterally. There are new patchy perihilar ground-glass opacities within the left upper and lower lobes. Mild dependent atelectasis at both lung bases is stable. There is no confluent airspace opacity or suspicious nodularity. Upper abdomen: The visualized upper abdomen appears stable without significant findings. Musculoskeletal/Chest wall: There is no chest wall mass or suspicious osseous finding. Stable nonunion of the previous median sternotomy without  bone destruction or diastasis. Review of the MIP images confirms the above findings. IMPRESSION: 1. No evidence of acute pulmonary embolism. There is a stable small linear filling defect within a right lower lobe pulmonary arterial branch which may be related to remote pulmonary embolism. 2. New patchy perihilar ground-glass opacities on the left and left pleural effusion, potentially inflammatory. No new findings in the right hemithorax. 3. New mildly enlarged mediastinal lymph nodes, likely reactive. 4. Extensive coronary and Aortic Atherosclerosis (ICD10-I70.0). Electronically Signed   By: Richardean Sale M.D.   On: 09/12/2019 12:11   Ct Angio Chest Pe W/cm &/or Wo Cm  Result Date: 08/21/2019 CLINICAL DATA:  85 year old male with shortness of breath with concern for pulmonary embolism. EXAM: CT ANGIOGRAPHY CHEST WITH CONTRAST TECHNIQUE: Multidetector CT imaging of the chest was performed using the standard protocol during bolus administration of intravenous contrast. Multiplanar CT image reconstructions and MIPs were obtained to evaluate the vascular anatomy. CONTRAST:  180mL OMNIPAQUE IOHEXOL 350 MG/ML SOLN COMPARISON:  Chest CT dated 08/05/2019 FINDINGS: Cardiovascular: There is mild cardiomegaly. No pericardial effusion. Three-vessel coronary vascular calcification. Advanced calcified and noncalcified plaque of the thoracic aorta. Evaluation of the pulmonary arteries is limited due to respiratory motion artifact and suboptimal visualization of the distal branches. No large or central pulmonary artery embolus identified. Right-sided Port-A-Cath with tip close to the cavoatrial junction. Mediastinum/Nodes: No hilar or mediastinal adenopathy. The esophagus is grossly unremarkable. No mediastinal fluid collection. Lungs/Pleura: There is a small right pleural effusion with right lower lobe compressive atelectasis although pneumonia is not excluded. A pleural catheter is noted with tip in the medial right lung  base pleural surface. There is no pneumothorax. The central airways are patent. Upper Abdomen: Cirrhosis. The visualized upper abdomen is otherwise unremarkable. Musculoskeletal: Median sternotomy wires. Multilevel degenerative changes of the spine. No acute osseous pathology. Review of the MIP images confirms the above findings. IMPRESSION: 1. No CT evidence of central pulmonary artery embolus. 2. Small right pleural effusion with right lower lobe compressive atelectasis. Pneumonia is not excluded. 3. Cirrhosis. 4. Aortic Atherosclerosis (ICD10-I70.0). Electronically Signed   By: Anner Crete M.D.   On: 08/21/2019 01:26   Dg Chest Port 1 View  Result Date: 08/20/2019 CLINICAL DATA:  Dyspnea EXAM: PORTABLE CHEST 1 VIEW COMPARISON:  August 19, 2019 FINDINGS: The right-sided Port-A-Cath is stable in positioning. There is a  persistent small right-sided pleural effusion. No pneumothorax. There is an unchanged airspace opacity at the right lung base. The heart size is stable. The patient is status post prior median sternotomy. IMPRESSION: No significant interval change. Persistent right-sided pleural effusion. Electronically Signed   By: Constance Holster M.D.   On: 08/20/2019 23:09    ASSESSMENT AND PLAN: This is a very pleasant 85 years old white male with malignant pleural mesothelioma, epithelioid type involving the right hemithorax. The patient started systemic chemotherapy with carboplatin for AUC of 5 and Alimta 500 mg/M2 status post 3 cycles.   The patient has been tolerating this treatment well except for increasing fatigue and weakness. He was recently completed treatment with Levaquin for suspicious pneumonia. I recommended for the patient to resume his systemic chemotherapy with carboplatin and Alimta but I will reduce the dose of carboplatin to AUC of 4 and Alimta to 400 mg/M2 every 3 weeks starting from cycle #4. He will proceed with cycle #4 today as planned. I will see him back for  follow-up visit in 3 weeks for evaluation before the next cycle of his treatment. He was advised to call immediately if he has any concerning symptoms in the interval. The patient voices understanding of current disease status and treatment options and is in agreement with the current care plan.  All questions were answered. The patient knows to call the clinic with any problems, questions or concerns. We can certainly see the patient much sooner if necessary.  Disclaimer: This note was dictated with voice recognition software. Similar sounding words can inadvertently be transcribed and may not be corrected upon review.

## 2019-09-20 ENCOUNTER — Inpatient Hospital Stay: Payer: Medicare Other

## 2019-09-20 ENCOUNTER — Other Ambulatory Visit: Payer: Self-pay | Admitting: Cardiothoracic Surgery

## 2019-09-20 DIAGNOSIS — J90 Pleural effusion, not elsewhere classified: Secondary | ICD-10-CM

## 2019-09-21 ENCOUNTER — Other Ambulatory Visit: Payer: Self-pay

## 2019-09-21 ENCOUNTER — Ambulatory Visit (INDEPENDENT_AMBULATORY_CARE_PROVIDER_SITE_OTHER): Payer: Medicare Other | Admitting: Cardiothoracic Surgery

## 2019-09-21 ENCOUNTER — Encounter: Payer: Self-pay | Admitting: Cardiothoracic Surgery

## 2019-09-21 ENCOUNTER — Ambulatory Visit
Admission: RE | Admit: 2019-09-21 | Discharge: 2019-09-21 | Disposition: A | Discharge status: Home / Self Care | Payer: Medicare Other | Source: Ambulatory Visit | Attending: Cardiothoracic Surgery | Admitting: Cardiothoracic Surgery

## 2019-09-21 VITALS — BP 126/71 | HR 84 | Temp 97.9°F | Resp 20 | Ht 72.0 in | Wt 198.0 lb

## 2019-09-21 DIAGNOSIS — Z9689 Presence of other specified functional implants: Secondary | ICD-10-CM | POA: Diagnosis not present

## 2019-09-21 DIAGNOSIS — C450 Mesothelioma of pleura: Secondary | ICD-10-CM | POA: Diagnosis not present

## 2019-09-21 DIAGNOSIS — J90 Pleural effusion, not elsewhere classified: Secondary | ICD-10-CM

## 2019-09-21 NOTE — Progress Notes (Signed)
PCP is [name], MD Referring Provider is Rigoberto Noel, MD  Chief Complaint  Patient presents with  . Pleural Effusion    1 month f/u with CXR    HPI: History of right Pleurx catheter for mesothelioma.  Patient receiving chemotherapy by Dr. Julien Nordmann every 3 weeks.  Pleurx drainage on a Monday-Thursday schedule returns 350-400 cc each day.  His breathing has been satisfactory.  He has PRN oxygen at home.  Today room air saturation is 96%.  Today's chest x-ray shows scant right pleural effusion with Pleurx catheter in good position.   Past Medical History:  Diagnosis Date  . Anxiety   . Aortic stenosis    mild AS by 07/20/13 echo ([facility])  . Arthritis    "all over"  . Coronary artery disease    NSTEMI 06/2010, CABG x3=> [place]->LAD, SVG->OM1, SVG->PDA, DES LCx 10/2011, DES LAD 12/2011  . Depression   . GERD (gastroesophageal reflux disease)   . H/O hiatal hernia   . Hearing aid worn    B/L  . High cholesterol   . History of blood transfusion reaction Mar 01, 2013   "he almost died; he has to get irradiated blood next time"  . Hypertension   . Hypothyroidism   . Ischemic cardiomyopathy   . ITP (idiopathic thrombocytopenic purpura)    Dr. Gaynelle Arabian, on [place]  . Myocardial infarction ([place]) March 01, 2010  . Pneumonia 1940's X 1; March 01, 2014 X 1  . Rheumatoid arthritis ([place])   . Shortness of breath    with exertion, has not been very active  . Sleep apnea   . Wears glasses   . Wears partial dentures     Past Surgical History:  Procedure Laterality Date  . APPENDECTOMY    . BACK SURGERY    . CATARACT EXTRACTION, BILATERAL    . CHEST TUBE INSERTION Right 05/20/2019   Procedure: INSERTION PLEURAL DRAINAGE CATHETER;  Surgeon: Ivin Poot, MD;  Location: [facility];  Service: Thoracic;  Laterality: Right;  . COLONOSCOPY    . CORONARY ANGIOPLASTY WITH STENT PLACEMENT     DES Lcx 10/2011, DES LAD 12/2011  . CORONARY ARTERY BYPASS GRAFT  Mar 01, 2010   "CABG X3"  . CORONARY STENT  INTERVENTION N/A 05/26/2019   Procedure: CORONARY STENT INTERVENTION;  Surgeon: Nelva Bush, MD;  Location: [facility];  Service: Cardiovascular;  Laterality: N/A;  . GASTROC RECESSION EXTREMITY Right 07/06/2015   Pasty Spillers (orthopedics- [place], [place])  . HERNIA REPAIR    . HIATAL HERNIA REPAIR    . IR IMAGING GUIDED PORT INSERTION  06/29/2019  . KNEE ARTHROSCOPY Left 06/22/2014   w/I&D  . KNEE ARTHROSCOPY Left 06/22/2014   Procedure: IRRIGATION AND DEBRIDEMENT WITH CHRONDROPLASTY;  Surgeon: Hessie Dibble, MD;  Location: [facility];  Service: Orthopedics;  Laterality: Left;  . LEFT HEART CATH AND CORS/GRAFTS ANGIOGRAPHY N/A 05/26/2019   Procedure: LEFT HEART CATH AND CORS/GRAFTS ANGIOGRAPHY;  Surgeon: Nelva Bush, MD;  Location: [facility];  Service: Cardiovascular;  Laterality: N/A;  . LUMBAR DISC SURGERY  1960's?  . MULTIPLE TOOTH EXTRACTIONS    . PLEURAL BIOPSY Right 05/20/2019   Procedure: PLEURAL BIOPSY;  Surgeon: Ivin Poot, MD;  Location: [facility];  Service: Thoracic;  Laterality: Right;  . SHOULDER ARTHROSCOPY Right 04/09/2017   Procedure: ARTHROSCOPY SHOULDER;  Surgeon: Melrose Nakayama, MD;  Location: [facility];  Service: Orthopedics;  Laterality: Right;  . STERNAL CLOSURE     "wires from OHS taken out;  plate put in" (08/24/7168)  . SYNOVECTOMY Left 08/17/2014   Procedure: SYNOVECTOMY LEFT KNEE;  Surgeon: Hessie Dibble, MD;  Location: [facility];  Service: Orthopedics;  Laterality: Left;  . TOTAL ANKLE ARTHROPLASTY Right 07/06/2015   Pasty Spillers (orthopedics- [facility])  . TOTAL HIP ARTHROPLASTY Right 06/29/2018   Procedure: RIGHT TOTAL HIP ARTHROPLASTY ANTERIOR APPROACH;  Surgeon: Melrose Nakayama, MD;  Location: [facility];  Service: Orthopedics;  Laterality: Right;  . TOTAL HIP ARTHROPLASTY Left 06/09/2016   Scott Streater Claiborne Billings (orthopedics- [place])  . TOTAL KNEE ARTHROPLASTY Left 11/24/2014   Procedure: TOTAL KNEE ARTHROPLASTY;  Surgeon: Hessie Dibble, MD;  Location:  [facility];  Service: Orthopedics;  Laterality: Left;  [name] Kitchen VIDEO ASSISTED THORACOSCOPY Right 05/20/2019   Procedure: VIDEO ASSISTED THORACOSCOPY;  Surgeon: Ivin Poot, MD;  Location: [facility];  Service: Thoracic;  Laterality: Right;    Family History  Problem Relation Age of Onset  . Heart disease Other   . Arthritis Other   . Heart disease Mother   . Alzheimer's disease Father   . Rheum arthritis Sister   . Rheum arthritis Brother   . Healthy Son     Social History Social History   Tobacco Use  . Smoking status: Former Smoker    Packs/day: 1.00    Years: 15.00    Pack years: 15.00    Types: Cigarettes    Quit date: 12/22/1966    Years since quitting: 52.7  . Smokeless tobacco: Former Systems developer    Types: Chew  . Tobacco comment: "quit smoking ~ late ~ 60's; quit chewing in the 1970's"  Substance Use Topics  . Alcohol use: No  . Drug use: No    Current Outpatient Medications  Medication Sig Dispense Refill  . albuterol (VENTOLIN HFA) 108 (90 Base) MCG/ACT inhaler Inhale 1 puff into the lungs 2 (two) times daily as needed for shortness of breath.    . ALPRAZolam (XANAX) 0.25 MG tablet Take 1 tablet (0.25 mg total) by mouth 2 (two) times daily as needed for anxiety. 20 tablet 0  . aspirin EC 81 MG tablet Take 81 mg by mouth daily.    . benzonatate (TESSALON) 100 MG capsule Take 1 capsule (100 mg total) by mouth every 8 (eight) hours. 21 capsule 0  . dexamethasone (DECADRON) 4 MG tablet 4 mg p.o. twice daily the day before, day of and day after chemotherapy every 3 weeks 40 tablet 1  . fluconazole (DIFLUCAN) 100 MG tablet Take 1 tablet (100 mg total) by mouth daily. 7 tablet 0  . Fluticasone-Salmeterol (ADVAIR) 100-50 MCG/DOSE AEPB Inhale 1 puff into the lungs 2 (two) times daily.    . folic acid (FOLVITE) 1 MG tablet Take 1 tablet (1 mg total) by mouth daily. 30 tablet 4  . guaiFENesin (MUCINEX) 600 MG 12 hr tablet Take 1 tablet (600 mg total) by mouth 2 (two) times daily. 60 tablet 1   . ipratropium-albuterol (DUONEB) 0.5-2.5 (3) MG/3ML SOLN Take 3 mLs by nebulization every 6 (six) hours as needed. J44.9 (Patient taking differently: Take 3 mLs by nebulization every 6 (six) hours as needed (shortness of breath). J44.9) 360 mL 3  . levothyroxine (SYNTHROID, LEVOTHROID) 200 MCG tablet Take 200 mcg by mouth See admin instructions. Take 1 tablet Monday through Friday - None on Saturday or Sunday.    . lidocaine-prilocaine (EMLA) cream Apply 1 application topically as needed. 30 g 0  . mirtazapine (REMERON) 15 MG tablet Take 15 mg by  mouth at bedtime.    [name] Kitchen omeprazole (PRILOSEC) 40 MG capsule Take 40 mg by mouth at bedtime.     . prochlorperazine (COMPAZINE) 10 MG tablet Take 1 tablet (10 mg total) by mouth every 6 (six) hours as needed for nausea or vomiting. 30 tablet 0  . rivaroxaban (XARELTO) 20 MG TABS tablet Take 20 mg by mouth daily with supper.    . Vitamin D, Ergocalciferol, (DRISDOL) 1.25 MG (50000 UT) CAPS capsule Take 50,000 Units by mouth every 7 (seven) days.     No current facility-administered medications for this visit.     Allergies  Allergen Reactions  . Lipitor [Atorvastatin] Other (See Comments)    muscle spasms cramps  . Methylprednisolone Other (See Comments)    cramps cramps  . Morphine And Related     Hallucinations at times  . Other     If patient is to receive blood - blood must be treated witgh radiation because his body will not accept a normal infusion   . Sulfa Antibiotics Itching  . Ciprofloxacin Rash  . Doxycycline Rash    Doxycycline caused itching redness on scalp and head Doxycycline caused itching redness on scalp and head    Review of Systems   He has fallen at home. He is on chronic Xarelto. 1 time his drainage earlier in the month was bloody after a fall but that has resolved Catheter site exit has no drainage  BP 126/71   Pulse 84   Temp 97.9 F (36.6 C) (Skin)   Resp 20   Ht 6' (1.829 m)   Wt 198 lb (89.8 kg)   SpO2  96% Comment: RA  BMI 26.85 kg/m  Physical Exam Chronically ill male in a wheelchair but responsive and breathing comfortably Pleurx dressing clean and dry Breath sounds equal bilaterally Heart rhythm regular  Diagnostic Tests: Chest x-ray today personally reviewed showing no significant right pleural effusion with Pleurx catheter in place  Impression: Good control of malignant pleural effusion with Pleurx catheter Continue twice weekly drainage sessions Mondays and Thursdays Plan: Return in 6  Weeks for review of progress  Len Childs, MD [facility] [phone]

## 2019-09-26 ENCOUNTER — Ambulatory Visit: Payer: Medicare Other

## 2019-09-26 ENCOUNTER — Ambulatory Visit: Payer: Medicare Other | Admitting: Physician Assistant

## 2019-09-26 ENCOUNTER — Other Ambulatory Visit: Payer: Medicare Other

## 2019-09-27 ENCOUNTER — Inpatient Hospital Stay: Payer: Medicare Other | Attending: Internal Medicine

## 2019-09-27 ENCOUNTER — Other Ambulatory Visit: Payer: Self-pay

## 2019-09-27 DIAGNOSIS — Z7982 Long term (current) use of aspirin: Secondary | ICD-10-CM | POA: Insufficient documentation

## 2019-09-27 DIAGNOSIS — Z5111 Encounter for antineoplastic chemotherapy: Secondary | ICD-10-CM | POA: Insufficient documentation

## 2019-09-27 DIAGNOSIS — C450 Mesothelioma of pleura: Secondary | ICD-10-CM | POA: Insufficient documentation

## 2019-09-27 DIAGNOSIS — J90 Pleural effusion, not elsewhere classified: Secondary | ICD-10-CM | POA: Insufficient documentation

## 2019-09-27 DIAGNOSIS — F329 Major depressive disorder, single episode, unspecified: Secondary | ICD-10-CM | POA: Diagnosis not present

## 2019-09-27 DIAGNOSIS — F419 Anxiety disorder, unspecified: Secondary | ICD-10-CM | POA: Insufficient documentation

## 2019-09-27 DIAGNOSIS — I10 Essential (primary) hypertension: Secondary | ICD-10-CM | POA: Diagnosis not present

## 2019-09-27 DIAGNOSIS — E039 Hypothyroidism, unspecified: Secondary | ICD-10-CM | POA: Diagnosis not present

## 2019-09-27 DIAGNOSIS — I700 Atherosclerosis of aorta: Secondary | ICD-10-CM | POA: Diagnosis not present

## 2019-09-27 DIAGNOSIS — F418 Other specified anxiety disorders: Secondary | ICD-10-CM | POA: Insufficient documentation

## 2019-09-27 DIAGNOSIS — K5900 Constipation, unspecified: Secondary | ICD-10-CM | POA: Diagnosis not present

## 2019-09-27 DIAGNOSIS — K219 Gastro-esophageal reflux disease without esophagitis: Secondary | ICD-10-CM | POA: Insufficient documentation

## 2019-09-27 DIAGNOSIS — I2510 Atherosclerotic heart disease of native coronary artery without angina pectoris: Secondary | ICD-10-CM | POA: Insufficient documentation

## 2019-09-27 DIAGNOSIS — Z7901 Long term (current) use of anticoagulants: Secondary | ICD-10-CM | POA: Insufficient documentation

## 2019-09-27 DIAGNOSIS — Z79899 Other long term (current) drug therapy: Secondary | ICD-10-CM | POA: Insufficient documentation

## 2019-09-27 DIAGNOSIS — I252 Old myocardial infarction: Secondary | ICD-10-CM | POA: Insufficient documentation

## 2019-09-27 DIAGNOSIS — M069 Rheumatoid arthritis, unspecified: Secondary | ICD-10-CM | POA: Insufficient documentation

## 2019-09-27 DIAGNOSIS — D6481 Anemia due to antineoplastic chemotherapy: Secondary | ICD-10-CM | POA: Diagnosis not present

## 2019-09-27 DIAGNOSIS — I350 Nonrheumatic aortic (valve) stenosis: Secondary | ICD-10-CM | POA: Insufficient documentation

## 2019-09-27 DIAGNOSIS — I255 Ischemic cardiomyopathy: Secondary | ICD-10-CM | POA: Diagnosis not present

## 2019-09-27 DIAGNOSIS — G4730 Sleep apnea, unspecified: Secondary | ICD-10-CM | POA: Insufficient documentation

## 2019-09-27 LAB — CMP (CANCER CENTER ONLY)
ALT: 22 U/L (ref 0–44)
AST: 21 U/L (ref 15–41)
Albumin: 2.9 g/dL — ABNORMAL LOW (ref 3.5–5.0)
Alkaline Phosphatase: 79 U/L (ref 38–126)
Anion gap: 8 (ref 5–15)
BUN: 24 mg/dL — ABNORMAL HIGH (ref 8–23)
CO2: 28 mmol/L (ref 22–32)
Calcium: 8.9 mg/dL (ref 8.9–10.3)
Chloride: 105 mmol/L (ref 98–111)
Creatinine: 1.16 mg/dL (ref 0.61–1.24)
GFR, Est AFR Am: >60 mL/min (ref >60)
GFR, Estimated: 58 mL/min — ABNORMAL LOW (ref >60)
Glucose, Bld: 99 mg/dL (ref 70–99)
Potassium: 4.2 mmol/L (ref 3.5–5.1)
Sodium: 141 mmol/L (ref 135–145)
Total Bilirubin: 0.3 mg/dL (ref 0.3–1.2)
Total Protein: 6.7 g/dL (ref 6.5–8.1)

## 2019-09-27 LAB — CBC WITH DIFFERENTIAL (CANCER CENTER ONLY)
Abs Immature Granulocytes: 0.02 10*3/uL (ref 0.00–0.07)
Basophils Absolute: 0 10*3/uL (ref 0.0–0.1)
Basophils Relative: 0 %
Eosinophils Absolute: 0.8 10*3/uL — ABNORMAL HIGH (ref 0.0–0.5)
Eosinophils Relative: 15 %
HCT: 26.8 % — ABNORMAL LOW (ref 39.0–52.0)
Hemoglobin: 8.3 g/dL — ABNORMAL LOW (ref 13.0–17.0)
Immature Granulocytes: 0 %
Lymphocytes Relative: 30 %
Lymphs Abs: 1.7 10*3/uL (ref 0.7–4.0)
MCH: 30.6 pg (ref 26.0–34.0)
MCHC: 31 g/dL (ref 30.0–36.0)
MCV: 98.9 fL (ref 80.0–100.0)
Monocytes Absolute: 0.4 10*3/uL (ref 0.1–1.0)
Monocytes Relative: 7 %
Neutro Abs: 2.7 10*3/uL (ref 1.7–7.7)
Neutrophils Relative %: 48 %
Platelet Count: 177 10*3/uL (ref 150–400)
RBC: 2.71 MIL/uL — ABNORMAL LOW (ref 4.22–5.81)
RDW: 18.3 % — ABNORMAL HIGH (ref 11.5–15.5)
WBC Count: 5.6 10*3/uL (ref 4.0–10.5)
nRBC: 0 % (ref 0.0–0.2)

## 2019-10-04 ENCOUNTER — Inpatient Hospital Stay: Payer: Medicare Other

## 2019-10-04 ENCOUNTER — Other Ambulatory Visit: Payer: Self-pay

## 2019-10-04 DIAGNOSIS — C450 Mesothelioma of pleura: Secondary | ICD-10-CM

## 2019-10-04 LAB — CBC WITH DIFFERENTIAL (CANCER CENTER ONLY)
Abs Immature Granulocytes: 0.01 10*3/uL (ref 0.00–0.07)
Basophils Absolute: 0 10*3/uL (ref 0.0–0.1)
Basophils Relative: 0 %
Eosinophils Absolute: 0.8 10*3/uL — ABNORMAL HIGH (ref 0.0–0.5)
Eosinophils Relative: 16 %
HCT: 27.4 % — ABNORMAL LOW (ref 39.0–52.0)
Hemoglobin: 8.5 g/dL — ABNORMAL LOW (ref 13.0–17.0)
Immature Granulocytes: 0 %
Lymphocytes Relative: 26 %
Lymphs Abs: 1.3 10*3/uL (ref 0.7–4.0)
MCH: 31.4 pg (ref 26.0–34.0)
MCHC: 31 g/dL (ref 30.0–36.0)
MCV: 101.1 fL — ABNORMAL HIGH (ref 80.0–100.0)
Monocytes Absolute: 0.7 10*3/uL (ref 0.1–1.0)
Monocytes Relative: 14 %
Neutro Abs: 2.2 10*3/uL (ref 1.7–7.7)
Neutrophils Relative %: 44 %
Platelet Count: 166 10*3/uL (ref 150–400)
RBC: 2.71 MIL/uL — ABNORMAL LOW (ref 4.22–5.81)
RDW: 20.3 % — ABNORMAL HIGH (ref 11.5–15.5)
WBC Count: 5 10*3/uL (ref 4.0–10.5)
nRBC: 0 % (ref 0.0–0.2)

## 2019-10-04 LAB — CMP (CANCER CENTER ONLY)
ALT: 11 U/L (ref 0–44)
AST: 15 U/L (ref 15–41)
Albumin: 3.1 g/dL — ABNORMAL LOW (ref 3.5–5.0)
Alkaline Phosphatase: 66 U/L (ref 38–126)
Anion gap: 10 (ref 5–15)
BUN: 15 mg/dL (ref 8–23)
CO2: 27 mmol/L (ref 22–32)
Calcium: 8.8 mg/dL — ABNORMAL LOW (ref 8.9–10.3)
Chloride: 104 mmol/L (ref 98–111)
Creatinine: 0.95 mg/dL (ref 0.61–1.24)
GFR, Est AFR Am: >60 mL/min (ref >60)
GFR, Estimated: >60 mL/min (ref >60)
Glucose, Bld: 111 mg/dL — ABNORMAL HIGH (ref 70–99)
Potassium: 3.9 mmol/L (ref 3.5–5.1)
Sodium: 141 mmol/L (ref 135–145)
Total Bilirubin: 0.3 mg/dL (ref 0.3–1.2)
Total Protein: 6.9 g/dL (ref 6.5–8.1)

## 2019-10-05 ENCOUNTER — Institutional Professional Consult (permissible substitution): Payer: Medicare Other | Admitting: Pulmonary Disease

## 2019-10-05 ENCOUNTER — Ambulatory Visit (INDEPENDENT_AMBULATORY_CARE_PROVIDER_SITE_OTHER): Payer: Medicare Other | Admitting: Pulmonary Disease

## 2019-10-05 ENCOUNTER — Encounter: Payer: Self-pay | Admitting: Pulmonary Disease

## 2019-10-05 VITALS — BP 116/64 | HR 78 | Ht 72.0 in | Wt 200.0 lb

## 2019-10-05 DIAGNOSIS — J90 Pleural effusion, not elsewhere classified: Secondary | ICD-10-CM

## 2019-10-05 DIAGNOSIS — C450 Mesothelioma of pleura: Secondary | ICD-10-CM

## 2019-10-05 NOTE — Patient Instructions (Addendum)
Thank you for visiting Dr. Valeta Harms at [facility]. Today we recommend the following:  Continue to use albuterol nebulizer as needed  Please call with any questions or concerns.   Return in about 6 months (around 04/04/2020) for with APP or Dr. Valeta Harms.  He been seen by Derl Barrow several times in the past.     Please do your part to reduce the spread of COVID-19.

## 2019-10-05 NOTE — Progress Notes (Signed)
Synopsis: Referred in Oct 2020 for Malignant pleural effusion, mesothelioma by [name], MD  Subjective:   PATIENT ID: Jeffrey [name] GENDER: male DOB: 1934-06-20, MRN: [id number]  Chief Complaint  Patient presents with  . Follow-up    85 year old gentleman initially seen on hospitalization.  Follow-up in the office June 2020.  42, former smoker quit in Mar 06, 1967, 15-pack-year history.  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  Additional past medical history of COPD/asthma, OSA, Parkinson's disease, hypothyroidism, history of MRSA septic arthritis of the left knee, left total hip, ITP, coronary disease status post CABG mild left ear.  Was seen during hospitalization in May 2020 found to have a right-sided pleural effusion.  CT revealed a loculated right pleural effusion with pleural plaques status post thoracentesis which revealed a lymphocytic exudate low glucose.  Negative cytology with few mesothelial cells.  T CTS was consulted a right VATS was completed with pleural biopsy.  Talc pleurodesis with right Pleurx catheter placement by Dr. Darcey Nora.  This course was complicated by an end STEMI status post heart cath with PCI to the left circumflex plus DES treated with aspirin plus blood Brilinta x3 months and then continued on Brilinta now for the next year.  Recommended Pleurx drainage Monday Wednesday Friday.  Pathology consistent with mesothelioma.  Plan to treatment with chemo plus radiation.  Patient establish care in September with Dr. Earlie Server.  Patient treated with carboplatinum plus Alimta..  Patient seen today to establish care with new pulmonary provider.  Has only originally been followed up by Derl Barrow in clinic.  Since initial consultation in the hospital.  OV 10/05/2019: Patient seen today in the office to establish care with new pulmonary provider.  Otherwise doing well today.  He has been draining his Pleurx catheter on the right  side Monday and Thursday.  Usually gets about 150 cc out.  He has not had any redness or pain at the insertion site.  He has been very fatigued recently.  He does have occasional sputum production.  This is stable for his baseline.  He does use his albuterol nebulizer for shortness of breath.  He has oxygen at home.  Currently still undergoing chemotherapy for treatment of his mesothelioma.   Past Medical History:  Diagnosis Date  . Anxiety   . Aortic stenosis    mild AS by 07/20/13 echo ([facility])  . Arthritis    "all over"  . Coronary artery disease    NSTEMI 06/2010, CABG x3=> [place]->LAD, SVG->OM1, SVG->PDA, DES LCx 10/2011, DES LAD 12/2011  . Depression   . GERD (gastroesophageal reflux disease)   . H/O hiatal hernia   . Hearing aid worn    B/L  . High cholesterol   . History of blood transfusion reaction 03/05/2013   "he almost died; he has to get irradiated blood next time"  . Hypertension   . Hypothyroidism   . Ischemic cardiomyopathy   . ITP (idiopathic thrombocytopenic purpura)    Dr. Gaynelle Arabian, on [place]  . Myocardial infarction ([place]) 03-05-10  . Pneumonia 1940's X 1; 03/05/14 X 1  . Rheumatoid arthritis ([place])   . Shortness of breath    with exertion, has not been very active  . Sleep apnea   . Wears glasses   . Wears partial dentures      Family History  Problem Relation Age of Onset  . Heart disease Other   . Arthritis Other   .  Heart disease Mother   . Alzheimer's disease Father   . Rheum arthritis Sister   . Rheum arthritis Brother   . Healthy Son      Past Surgical History:  Procedure Laterality Date  . APPENDECTOMY    . BACK SURGERY    . CATARACT EXTRACTION, BILATERAL    . CHEST TUBE INSERTION Right 05/20/2019   Procedure: INSERTION PLEURAL DRAINAGE CATHETER;  Surgeon: Ivin Poot, MD;  Location: [facility];  Service: Thoracic;  Laterality: Right;  . COLONOSCOPY    . CORONARY ANGIOPLASTY WITH STENT PLACEMENT     DES Lcx 10/2011, DES LAD 12/2011   . CORONARY ARTERY BYPASS GRAFT  2011   "CABG X3"  . CORONARY STENT INTERVENTION N/A 05/26/2019   Procedure: CORONARY STENT INTERVENTION;  Surgeon: Nelva Bush, MD;  Location: [facility];  Service: Cardiovascular;  Laterality: N/A;  . GASTROC RECESSION EXTREMITY Right 07/06/2015   Pasty Spillers (orthopedics- [place], [place])  . HERNIA REPAIR    . HIATAL HERNIA REPAIR    . IR IMAGING GUIDED PORT INSERTION  06/29/2019  . KNEE ARTHROSCOPY Left 06/22/2014   w/I&D  . KNEE ARTHROSCOPY Left 06/22/2014   Procedure: IRRIGATION AND DEBRIDEMENT WITH CHRONDROPLASTY;  Surgeon: Hessie Dibble, MD;  Location: [facility];  Service: Orthopedics;  Laterality: Left;  . LEFT HEART CATH AND CORS/GRAFTS ANGIOGRAPHY N/A 05/26/2019   Procedure: LEFT HEART CATH AND CORS/GRAFTS ANGIOGRAPHY;  Surgeon: Nelva Bush, MD;  Location: [facility];  Service: Cardiovascular;  Laterality: N/A;  . LUMBAR DISC SURGERY  1960's?  . MULTIPLE TOOTH EXTRACTIONS    . PLEURAL BIOPSY Right 05/20/2019   Procedure: PLEURAL BIOPSY;  Surgeon: Ivin Poot, MD;  Location: [facility];  Service: Thoracic;  Laterality: Right;  . SHOULDER ARTHROSCOPY Right 04/09/2017   Procedure: ARTHROSCOPY SHOULDER;  Surgeon: Melrose Nakayama, MD;  Location: [facility];  Service: Orthopedics;  Laterality: Right;  . STERNAL CLOSURE     "wires from OHS taken out; plate put in" (06/23/5328)  . SYNOVECTOMY Left 08/17/2014   Procedure: SYNOVECTOMY LEFT KNEE;  Surgeon: Hessie Dibble, MD;  Location: [facility];  Service: Orthopedics;  Laterality: Left;  . TOTAL ANKLE ARTHROPLASTY Right 07/06/2015   Pasty Spillers (orthopedics- [facility])  . TOTAL HIP ARTHROPLASTY Right 06/29/2018   Procedure: RIGHT TOTAL HIP ARTHROPLASTY ANTERIOR APPROACH;  Surgeon: Melrose Nakayama, MD;  Location: [facility];  Service: Orthopedics;  Laterality: Right;  . TOTAL HIP ARTHROPLASTY Left 06/09/2016   Scott Streater Claiborne Billings (orthopedics- [place])  . TOTAL KNEE ARTHROPLASTY Left 11/24/2014    Procedure: TOTAL KNEE ARTHROPLASTY;  Surgeon: Hessie Dibble, MD;  Location: [facility];  Service: Orthopedics;  Laterality: Left;  [name] Kitchen VIDEO ASSISTED THORACOSCOPY Right 05/20/2019   Procedure: VIDEO ASSISTED THORACOSCOPY;  Surgeon: Ivin Poot, MD;  Location: [facility];  Service: Thoracic;  Laterality: Right;    Social History   Socioeconomic History  . Marital status: Married    Spouse name: Not on file  . Number of children: Not on file  . Years of education: 12+  . Highest education level: Not on file  Occupational History  . Occupation: retired    Comment: Engineer, maintenance  . Financial resource strain: Not on file  . Food insecurity    Worry: Not on file    Inability: Not on file  . Transportation needs    Medical: Not on file    Non-medical: Not on file  Tobacco Use  .  Smoking status: Former Smoker    Packs/day: 1.00    Years: 15.00    Pack years: 15.00    Types: Cigarettes    Quit date: 12/22/1966    Years since quitting: 52.8  . Smokeless tobacco: Former Systems developer    Types: Chew  . Tobacco comment: "quit smoking ~ late ~ 60's; quit chewing in the 1970's"  Substance and Sexual Activity  . Alcohol use: No  . Drug use: No  . Sexual activity: Not Currently  Lifestyle  . Physical activity    Days per week: Not on file    Minutes per session: Not on file  . Stress: Not on file  Relationships  . Social Herbalist on phone: Not on file    Gets together: Not on file    Attends religious service: Not on file    Active member of club or organization: Not on file    Attends meetings of clubs or organizations: Not on file    Relationship status: Not on file  . Intimate partner violence    Fear of current or ex partner: Not on file    Emotionally abused: Not on file    Physically abused: Not on file    Forced sexual activity: Not on file  Other Topics Concern  . Not on file  Social History Narrative  . Not on file     Allergies  Allergen Reactions  .  Lipitor [Atorvastatin] Other (See Comments)    muscle spasms cramps  . Methylprednisolone Other (See Comments)    cramps cramps  . Morphine And Related     Hallucinations at times  . Other     If patient is to receive blood - blood must be treated witgh radiation because his body will not accept a normal infusion   . Sulfa Antibiotics Itching  . Ciprofloxacin Rash  . Doxycycline Rash    Doxycycline caused itching redness on scalp and head Doxycycline caused itching redness on scalp and head     Outpatient Medications Prior to Visit  Medication Sig Dispense Refill  . albuterol (VENTOLIN HFA) 108 (90 Base) MCG/ACT inhaler Inhale 1 puff into the lungs 2 (two) times daily as needed for shortness of breath.    . ALPRAZolam (XANAX) 0.25 MG tablet Take 1 tablet (0.25 mg total) by mouth 2 (two) times daily as needed for anxiety. 20 tablet 0  . aspirin EC 81 MG tablet Take 81 mg by mouth daily.    . benzonatate (TESSALON) 100 MG capsule Take 1 capsule (100 mg total) by mouth every 8 (eight) hours. 21 capsule 0  . dexamethasone (DECADRON) 4 MG tablet 4 mg p.o. twice daily the day before, day of and day after chemotherapy every 3 weeks 40 tablet 1  . fluconazole (DIFLUCAN) 100 MG tablet Take 1 tablet (100 mg total) by mouth daily. 7 tablet 0  . Fluticasone-Salmeterol (ADVAIR) 100-50 MCG/DOSE AEPB Inhale 1 puff into the lungs 2 (two) times daily.    . folic acid (FOLVITE) 1 MG tablet Take 1 tablet (1 mg total) by mouth daily. 30 tablet 4  . guaiFENesin (MUCINEX) 600 MG 12 hr tablet Take 1 tablet (600 mg total) by mouth 2 (two) times daily. 60 tablet 1  . ipratropium-albuterol (DUONEB) 0.5-2.5 (3) MG/3ML SOLN Take 3 mLs by nebulization every 6 (six) hours as needed. J44.9 (Patient taking differently: Take 3 mLs by nebulization every 6 (six) hours as needed (shortness of breath). J44.9) 360 mL  3  . levothyroxine (SYNTHROID, LEVOTHROID) 200 MCG tablet Take 200 mcg by mouth See admin instructions. Take  1 tablet Monday through Friday - None on Saturday or Sunday.    . lidocaine-prilocaine (EMLA) cream Apply 1 application topically as needed. 30 g 0  . mirtazapine (REMERON) 15 MG tablet Take 15 mg by mouth at bedtime.    [name] Kitchen omeprazole (PRILOSEC) 40 MG capsule Take 40 mg by mouth at bedtime.     . prochlorperazine (COMPAZINE) 10 MG tablet Take 1 tablet (10 mg total) by mouth every 6 (six) hours as needed for nausea or vomiting. 30 tablet 0  . rivaroxaban (XARELTO) 20 MG TABS tablet Take 20 mg by mouth daily with supper.    . Vitamin D, Ergocalciferol, (DRISDOL) 1.25 MG (50000 UT) CAPS capsule Take 50,000 Units by mouth every 7 (seven) days.     No facility-administered medications prior to visit.     Review of Systems  Constitutional: Positive for malaise/fatigue and weight loss. Negative for chills and fever.  HENT: Negative for hearing loss, sore throat and tinnitus.   Eyes: Negative for blurred vision and double vision.  Respiratory: Positive for shortness of breath. Negative for cough, hemoptysis, sputum production, wheezing and stridor.   Cardiovascular: Negative for chest pain, palpitations, orthopnea, leg swelling and PND.  Gastrointestinal: Negative for abdominal pain, constipation, diarrhea, heartburn, nausea and vomiting.  Genitourinary: Negative for dysuria, hematuria and urgency.  Musculoskeletal: Negative for joint pain and myalgias.  Skin: Negative for itching and rash.  Neurological: Positive for weakness. Negative for dizziness, tingling and headaches.  Endo/Heme/Allergies: Negative for environmental allergies. Does not bruise/bleed easily.  Psychiatric/Behavioral: Negative for depression. The patient is not nervous/anxious and does not have insomnia.   All other systems reviewed and are negative.    Objective:  Physical Exam Vitals signs reviewed.  Constitutional:      General: He is not in acute distress.    Appearance: He is well-developed.  HENT:     Head:  Normocephalic and atraumatic.  Eyes:     General: No scleral icterus.    Conjunctiva/sclera: Conjunctivae normal.     Pupils: Pupils are equal, round, and reactive to light.  Neck:     Musculoskeletal: Neck supple.     Vascular: No JVD.     Trachea: No tracheal deviation.  Cardiovascular:     Rate and Rhythm: Normal rate and regular rhythm.     Heart sounds: Normal heart sounds. No murmur.  Pulmonary:     Effort: Pulmonary effort is normal. No tachypnea, accessory muscle usage or respiratory distress.     Breath sounds: Normal breath sounds. No stridor. No wheezing, rhonchi or rales.  Abdominal:     General: Bowel sounds are normal. There is no distension.     Palpations: Abdomen is soft.     Tenderness: There is no abdominal tenderness.  Musculoskeletal:        General: No tenderness.  Lymphadenopathy:     Cervical: No cervical adenopathy.  Skin:    General: Skin is warm and dry.     Capillary Refill: Capillary refill takes less than 2 seconds.     Findings: No rash.  Neurological:     Mental Status: He is alert and oriented to person, place, and time.     Comments: Tremor baseline, Parkinson's disease  Psychiatric:        Behavior: Behavior normal.      Vitals:   10/05/19 1405  BP: 116/64  Pulse:  78  SpO2: 99%  Weight: 200 lb (90.7 kg)  Height: 6' (1.829 m)   99% on RA BMI Readings from Last 3 Encounters:  10/05/19 27.12 kg/m  09/21/19 26.85 kg/m  09/19/19 26.61 kg/m   Wt Readings from Last 3 Encounters:  10/05/19 200 lb (90.7 kg)  09/21/19 198 lb (89.8 kg)  09/19/19 196 lb 3.2 oz (89 kg)     CBC    Component Value Date/Time   WBC 5.0 10/04/2019 1349   WBC 16.9 (H) 09/12/2019 1030   RBC 2.71 (L) 10/04/2019 1349   HGB 8.5 (L) 10/04/2019 1349   HCT 27.4 (L) 10/04/2019 1349   PLT 166 10/04/2019 1349   MCV 101.1 (H) 10/04/2019 1349   MCH 31.4 10/04/2019 1349   MCHC 31.0 10/04/2019 1349   RDW 20.3 (H) 10/04/2019 1349   LYMPHSABS 1.3 10/04/2019  1349   MONOABS 0.7 10/04/2019 1349   EOSABS 0.8 (H) 10/04/2019 1349   BASOSABS 0.0 10/04/2019 1349    Chest Imaging: 09/21/2019: Chest x-ray Small loculated right-sided pleural effusion with Pleurx catheter in place.  Also tiny left pleural effusion. The patient's images have been independently reviewed by me.    Pulmonary Functions Testing Results: No flowsheet data found.  FeNO: None  Pathology:   04/2019: 3. Pleura, biopsy, Right lower lobe - MALIGNANT EPITHELIOID NEOPLASM (OUTSIDE CONSULTATION DIAGNOSIS). - SEE MICROSCOPIC DESCRIPTION.  Echocardiogram:   07/2019:  1. Stage 1: stage1: Basal and mid anterolateral wall is abnormal.  2. The left ventricle has a visually estimated ejection fraction of 45%. The cavity size was normal. There is moderate concentric left ventricular hypertrophy. Left ventricular diastolic Doppler parameters are consistent with impaired relaxation.  Indeterminate filling pressures Left ventricular diffuse hypokinesis.  3. The right ventricle has normal systolic function. The cavity was normal. There is no increase in right ventricular wall thickness.  4. The mitral valve is degenerative. Mild thickening of the mitral valve leaflet. There is mild mitral annular calcification present.  5. The aortic valve was not well visualized. Aortic valve regurgitation is mild by color flow Doppler. Mild stenosis of the aortic valve.  6. The aorta is abnormal unless otherwise noted.  7. There is mild dilatation of the aortic root measuring 40 mm.  8. The interatrial septum appears to be lipomatous.  Heart Catheterization:   05/2019: Conclusions: 1. Severe native coronary artery disease with occluded ostial LAD and proximal non-dominant RCA, as well as 90% ramus, 90% proximal LCx (possible ISR, though reported prior stents are not well-visualized), and CTO of OM1. 2. Widely patent LIMA-LAD. 3. Chronically occluded SVG-OM and SVG-LPDA. 4. Moderately elevated  LVEDP. 5. Mild aortic stenosis (peak-to-peak gradient 15 mmHg) 6. Successful PCI to proximal LCx using Synergy 2.75 x 20 mm DES, post-dilated to 3.1 mm with 0% residual stenosis and TIMI-3 flow.     Assessment & Plan:     ICD-10-CM   1. Pleural effusion on right  J90   2. Malignant mesothelioma of pleura ([place])  C45.0     Discussion:  This is a 85 year old gentleman with malignant right-sided pleural effusion status post Pleurx catheter placement by cardiothoracic surgery.  Currently draining Monday and Thursday at approximately 150 cc at a time. Also undergoing chemotherapy for malignant mesothelioma of the pleura.  Plan: We are here to help manage any of his potential respiratory needs. Would continue use of albuterol as needed for shortness of breath and wheezing. Parkinson's disease also may predispose patient to aspiration. We discussed importance  of being careful with p.o. intake.  Patient to return to our clinic in 6 months or as needed.  Greater than 50% of this patient's 25-minute visit was spent face-to-face discussing the above recommendations and treatment plan.  As well as review of previous medical records hospitalization, prior procedures and pathology results.  Please see documentation above.   Current Outpatient Medications:  .  albuterol (VENTOLIN HFA) 108 (90 Base) MCG/ACT inhaler, Inhale 1 puff into the lungs 2 (two) times daily as needed for shortness of breath., Disp: , Rfl:  .  ALPRAZolam (XANAX) 0.25 MG tablet, Take 1 tablet (0.25 mg total) by mouth 2 (two) times daily as needed for anxiety., Disp: 20 tablet, Rfl: 0 .  aspirin EC 81 MG tablet, Take 81 mg by mouth daily., Disp: , Rfl:  .  benzonatate (TESSALON) 100 MG capsule, Take 1 capsule (100 mg total) by mouth every 8 (eight) hours., Disp: 21 capsule, Rfl: 0 .  dexamethasone (DECADRON) 4 MG tablet, 4 mg p.o. twice daily the day before, day of and day after chemotherapy every 3 weeks, Disp: 40 tablet, Rfl:  1 .  fluconazole (DIFLUCAN) 100 MG tablet, Take 1 tablet (100 mg total) by mouth daily., Disp: 7 tablet, Rfl: 0 .  Fluticasone-Salmeterol (ADVAIR) 100-50 MCG/DOSE AEPB, Inhale 1 puff into the lungs 2 (two) times daily., Disp: , Rfl:  .  folic acid (FOLVITE) 1 MG tablet, Take 1 tablet (1 mg total) by mouth daily., Disp: 30 tablet, Rfl: 4 .  guaiFENesin (MUCINEX) 600 MG 12 hr tablet, Take 1 tablet (600 mg total) by mouth 2 (two) times daily., Disp: 60 tablet, Rfl: 1 .  ipratropium-albuterol (DUONEB) 0.5-2.5 (3) MG/3ML SOLN, Take 3 mLs by nebulization every 6 (six) hours as needed. J44.9 (Patient taking differently: Take 3 mLs by nebulization every 6 (six) hours as needed (shortness of breath). J44.9), Disp: 360 mL, Rfl: 3 .  levothyroxine (SYNTHROID, LEVOTHROID) 200 MCG tablet, Take 200 mcg by mouth See admin instructions. Take 1 tablet Monday through Friday - None on Saturday or Sunday., Disp: , Rfl:  .  lidocaine-prilocaine (EMLA) cream, Apply 1 application topically as needed., Disp: 30 g, Rfl: 0 .  mirtazapine (REMERON) 15 MG tablet, Take 15 mg by mouth at bedtime., Disp: , Rfl:  .  omeprazole (PRILOSEC) 40 MG capsule, Take 40 mg by mouth at bedtime. , Disp: , Rfl:  .  prochlorperazine (COMPAZINE) 10 MG tablet, Take 1 tablet (10 mg total) by mouth every 6 (six) hours as needed for nausea or vomiting., Disp: 30 tablet, Rfl: 0 .  rivaroxaban (XARELTO) 20 MG TABS tablet, Take 20 mg by mouth daily with supper., Disp: , Rfl:  .  Vitamin D, Ergocalciferol, (DRISDOL) 1.25 MG (50000 UT) CAPS capsule, Take 50,000 Units by mouth every 7 (seven) days., Disp: , Rfl:    Garner Nash, DO [facility] 10/05/2019 2:29 PM

## 2019-10-11 ENCOUNTER — Encounter: Payer: Self-pay | Admitting: Physician Assistant

## 2019-10-11 ENCOUNTER — Inpatient Hospital Stay: Payer: Medicare Other

## 2019-10-11 ENCOUNTER — Other Ambulatory Visit: Payer: Self-pay

## 2019-10-11 ENCOUNTER — Inpatient Hospital Stay (HOSPITAL_BASED_OUTPATIENT_CLINIC_OR_DEPARTMENT_OTHER): Payer: Medicare Other | Admitting: Physician Assistant

## 2019-10-11 ENCOUNTER — Telehealth: Payer: Self-pay

## 2019-10-11 ENCOUNTER — Other Ambulatory Visit: Payer: Self-pay | Admitting: Medical

## 2019-10-11 ENCOUNTER — Inpatient Hospital Stay (HOSPITAL_BASED_OUTPATIENT_CLINIC_OR_DEPARTMENT_OTHER): Payer: Medicare Other | Admitting: Medical

## 2019-10-11 VITALS — BP 122/65 | HR 64 | Temp 98.1°F | Resp 18

## 2019-10-11 VITALS — BP 121/65 | HR 76 | Temp 98.3°F | Resp 18 | Ht 72.0 in | Wt 197.3 lb

## 2019-10-11 DIAGNOSIS — D539 Nutritional anemia, unspecified: Secondary | ICD-10-CM

## 2019-10-11 DIAGNOSIS — T451X5A Adverse effect of antineoplastic and immunosuppressive drugs, initial encounter: Secondary | ICD-10-CM

## 2019-10-11 DIAGNOSIS — Z5111 Encounter for antineoplastic chemotherapy: Secondary | ICD-10-CM

## 2019-10-11 DIAGNOSIS — R066 Hiccough: Secondary | ICD-10-CM

## 2019-10-11 DIAGNOSIS — C450 Mesothelioma of pleura: Secondary | ICD-10-CM | POA: Diagnosis not present

## 2019-10-11 DIAGNOSIS — D6481 Anemia due to antineoplastic chemotherapy: Secondary | ICD-10-CM

## 2019-10-11 LAB — CBC WITH DIFFERENTIAL (CANCER CENTER ONLY)
Abs Immature Granulocytes: 0.03 10*3/uL (ref 0.00–0.07)
Basophils Absolute: 0 10*3/uL (ref 0.0–0.1)
Basophils Relative: 0 %
Eosinophils Absolute: 0 10*3/uL (ref 0.0–0.5)
Eosinophils Relative: 0 %
HCT: 25.8 % — ABNORMAL LOW (ref 39.0–52.0)
Hemoglobin: 8.2 g/dL — ABNORMAL LOW (ref 13.0–17.0)
Immature Granulocytes: 1 %
Lymphocytes Relative: 18 %
Lymphs Abs: 1.1 10*3/uL (ref 0.7–4.0)
MCH: 31.5 pg (ref 26.0–34.0)
MCHC: 31.8 g/dL (ref 30.0–36.0)
MCV: 99.2 fL (ref 80.0–100.0)
Monocytes Absolute: 0.6 10*3/uL (ref 0.1–1.0)
Monocytes Relative: 9 %
Neutro Abs: 4.8 10*3/uL (ref 1.7–7.7)
Neutrophils Relative %: 72 %
Platelet Count: 292 10*3/uL (ref 150–400)
RBC: 2.6 MIL/uL — ABNORMAL LOW (ref 4.22–5.81)
RDW: 18.8 % — ABNORMAL HIGH (ref 11.5–15.5)
WBC Count: 6.5 10*3/uL (ref 4.0–10.5)
nRBC: 0 % (ref 0.0–0.2)

## 2019-10-11 LAB — CMP (CANCER CENTER ONLY)
ALT: 9 U/L (ref 0–44)
AST: 12 U/L — ABNORMAL LOW (ref 15–41)
Albumin: 2.8 g/dL — ABNORMAL LOW (ref 3.5–5.0)
Alkaline Phosphatase: 65 U/L (ref 38–126)
Anion gap: 15 (ref 5–15)
BUN: 23 mg/dL (ref 8–23)
CO2: 20 mmol/L — ABNORMAL LOW (ref 22–32)
Calcium: 8.9 mg/dL (ref 8.9–10.3)
Chloride: 107 mmol/L (ref 98–111)
Creatinine: 1.17 mg/dL (ref 0.61–1.24)
GFR, Est AFR Am: >60 mL/min (ref >60)
GFR, Estimated: 57 mL/min — ABNORMAL LOW (ref >60)
Glucose, Bld: 207 mg/dL — ABNORMAL HIGH (ref 70–99)
Potassium: 5.2 mmol/L — ABNORMAL HIGH (ref 3.5–5.1)
Sodium: 142 mmol/L (ref 135–145)
Total Bilirubin: 0.3 mg/dL (ref 0.3–1.2)
Total Protein: 7 g/dL (ref 6.5–8.1)

## 2019-10-11 LAB — SAMPLE TO BLOOD BANK

## 2019-10-11 LAB — PREPARE RBC (CROSSMATCH)

## 2019-10-11 MED ORDER — PALONOSETRON HCL INJECTION 0.25 MG/5ML
INTRAVENOUS | Status: AC
  Filled 2019-10-11: qty 5

## 2019-10-11 MED ORDER — SODIUM CHLORIDE 0.9% FLUSH
10.0000 mL | INTRAVENOUS | Status: DC | PRN
  Administered 2019-10-11: 10 mL
  Filled 2019-10-11: qty 10

## 2019-10-11 MED ORDER — SODIUM CHLORIDE 0.9 % IV SOLN
900.0000 mg | Freq: Once | INTRAVENOUS | Status: AC
  Administered 2019-10-11: 900 mg via INTRAVENOUS
  Filled 2019-10-11: qty 20

## 2019-10-11 MED ORDER — DIPHENHYDRAMINE HCL 25 MG PO CAPS
25.0000 mg | ORAL_CAPSULE | Freq: Once | ORAL | Status: AC
  Administered 2019-10-11: 25 mg via ORAL

## 2019-10-11 MED ORDER — ACETAMINOPHEN 500 MG PO TABS
ORAL_TABLET | ORAL | Status: AC
  Filled 2019-10-11: qty 2

## 2019-10-11 MED ORDER — ACETAMINOPHEN 325 MG PO TABS
650.0000 mg | ORAL_TABLET | Freq: Once | ORAL | Status: AC
  Administered 2019-10-11: 650 mg via ORAL

## 2019-10-11 MED ORDER — CHLORPROMAZINE HCL 25 MG PO TABS
ORAL_TABLET | ORAL | Status: AC
  Filled 2019-10-11: qty 1

## 2019-10-11 MED ORDER — CHLORPROMAZINE HCL 25 MG PO TABS
25.0000 mg | ORAL_TABLET | Freq: Once | ORAL | Status: AC
  Administered 2019-10-11: 13:00:00 25 mg via ORAL

## 2019-10-11 MED ORDER — SODIUM CHLORIDE 0.9 % IV SOLN
Freq: Once | INTRAVENOUS | Status: AC
  Administered 2019-10-11: 11:00:00 via INTRAVENOUS
  Filled 2019-10-11: qty 250

## 2019-10-11 MED ORDER — SODIUM CHLORIDE 0.9 % IV SOLN
340.0000 mg | Freq: Once | INTRAVENOUS | Status: AC
  Administered 2019-10-11: 340 mg via INTRAVENOUS
  Filled 2019-10-11: qty 34

## 2019-10-11 MED ORDER — ACETAMINOPHEN 325 MG PO TABS
ORAL_TABLET | ORAL | Status: AC
  Filled 2019-10-11: qty 2

## 2019-10-11 MED ORDER — SODIUM CHLORIDE 0.9 % IV SOLN
Freq: Once | INTRAVENOUS | Status: AC
  Administered 2019-10-11: 12:00:00 via INTRAVENOUS
  Filled 2019-10-11: qty 5

## 2019-10-11 MED ORDER — HEPARIN SOD (PORK) LOCK FLUSH 100 UNIT/ML IV SOLN
500.0000 [IU] | Freq: Once | INTRAVENOUS | Status: AC | PRN
  Administered 2019-10-11: 500 [IU]
  Filled 2019-10-11: qty 5

## 2019-10-11 MED ORDER — DIPHENHYDRAMINE HCL 25 MG PO CAPS
ORAL_CAPSULE | ORAL | Status: AC
  Filled 2019-10-11: qty 1

## 2019-10-11 MED ORDER — PALONOSETRON HCL INJECTION 0.25 MG/5ML
0.2500 mg | Freq: Once | INTRAVENOUS | Status: AC
  Administered 2019-10-11: 0.25 mg via INTRAVENOUS

## 2019-10-11 MED ORDER — SODIUM CHLORIDE 0.9% IV SOLUTION
250.0000 mL | Freq: Once | INTRAVENOUS | Status: AC
  Administered 2019-10-11: 250 mL via INTRAVENOUS
  Filled 2019-10-11: qty 250

## 2019-10-11 NOTE — Progress Notes (Signed)
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  PRIOR THERAPY: None  CURRENT THERAPY: Systemic chemotherapy with carboplatin for AUC of 5 and Alimta 500 mg/M2 every 3 weeks.  Status post 4 cycles.  Starting from cycle #4 he will be treated with carboplatin for AUC of 4 and Alimta 400 mg/M2.  INTERVAL HISTORY: Jeffrey [name] 85 y.o. male returns to the clinic for a follow up visit. The patient is feeling fair without any particular concerning complaints except that he "stays tired". He denies any recent fevers, chills, night sweats or weight loss. He denies any chest pain or cough. He wears 2L of oxygen via nasal cannula when needed and reports some shortness of breath with exertion. He has frequent constipation. He takes a stool softener once a day. His last bowel movement was last night and he reports that it was hard. He denies any nausea, vomiting, or diarrhea. He denies any headaches or visual changes. He is here for evaluation before starting cycle #5 today as scheduled.   MEDICAL HISTORY: Past Medical History:  Diagnosis Date  . Anxiety   . Aortic stenosis    mild AS by 07/20/13 echo ([facility])  . Arthritis    "all over"  . Coronary artery disease    NSTEMI 06/2010, CABG x3=> [place]->LAD, SVG->OM1, SVG->PDA, DES LCx 10/2011, DES LAD 12/2011  . Depression   . GERD (gastroesophageal reflux disease)   . H/O hiatal hernia   . Hearing aid worn    B/L  . High cholesterol   . History of blood transfusion reaction 03/12/2013   "he almost died; he has to get irradiated blood next time"  . Hypertension   . Hypothyroidism   . Ischemic cardiomyopathy   . ITP (idiopathic thrombocytopenic purpura)    Dr. Gaynelle Arabian, on  [place]  . Myocardial infarction ([place]) 12-Mar-2010  . Pneumonia 1940's X 1; Mar 12, 2014 X 1  . Rheumatoid arthritis ([place])   . Shortness of breath    with exertion, has not been very active  . Sleep apnea   . Wears glasses   . Wears partial dentures     ALLERGIES:  is allergic to lipitor [atorvastatin]; methylprednisolone; morphine and related; other; sulfa antibiotics; ciprofloxacin; and doxycycline.  MEDICATIONS:  Current Outpatient Medications  Medication Sig Dispense Refill  . albuterol (VENTOLIN HFA) 108 (90 Base) MCG/ACT inhaler Inhale 1 puff into the lungs 2 (two) times daily as needed for shortness of breath.    . ALPRAZolam (XANAX) 0.25 MG tablet Take 1 tablet (0.25 mg total) by mouth 2 (two) times daily as needed for anxiety. 20 tablet 0  . aspirin EC 81 MG tablet Take 81 mg by mouth daily.    . benzonatate (TESSALON) 100 MG capsule Take 1 capsule (100 mg total) by mouth every 8 (eight) hours. 21 capsule 0  . dexamethasone (DECADRON) 4 MG tablet 4 mg p.o. twice daily the day before, day of and day after chemotherapy every 3 weeks 40 tablet 1  . Fluticasone-Salmeterol (ADVAIR) 100-50 MCG/DOSE AEPB Inhale 1 puff into the lungs 2 (two) times daily.    . folic acid (FOLVITE) 1 MG tablet Take 1 tablet (1 mg total) by mouth daily. 30 tablet 4  . guaiFENesin (MUCINEX) 600 MG 12 hr tablet Take 1 tablet (  600 mg total) by mouth 2 (two) times daily. 60 tablet 1  . ipratropium-albuterol (DUONEB) 0.5-2.5 (3) MG/3ML SOLN Take 3 mLs by nebulization every 6 (six) hours as needed. J44.9 (Patient taking differently: Take 3 mLs by nebulization every 6 (six) hours as needed (shortness of breath). J44.9) 360 mL 3  . levothyroxine (SYNTHROID, LEVOTHROID) 200 MCG tablet Take 200 mcg by mouth See admin instructions. Take 1 tablet Monday through Friday - None on Saturday or Sunday.    . lidocaine-prilocaine (EMLA) cream Apply 1 application topically as needed. 30 g 0  . mirtazapine (REMERON) 15 MG tablet Take 15 mg by  mouth at bedtime.    [name] Kitchen omeprazole (PRILOSEC) 40 MG capsule Take 40 mg by mouth at bedtime.     . prochlorperazine (COMPAZINE) 10 MG tablet Take 1 tablet (10 mg total) by mouth every 6 (six) hours as needed for nausea or vomiting. 30 tablet 0  . rivaroxaban (XARELTO) 20 MG TABS tablet Take 20 mg by mouth daily with supper.    . Vitamin D, Ergocalciferol, (DRISDOL) 1.25 MG (50000 UT) CAPS capsule Take 50,000 Units by mouth every 7 (seven) days.     No current facility-administered medications for this visit.    Facility-Administered Medications Ordered in Other Visits  Medication Dose Route Frequency Provider Last Rate Last Dose  . 0.9 %  sodium chloride infusion (Manually program via Guardrails IV Fluids)  250 mL Intravenous Once Evola Hollis L, PA-C      . heparin lock flush 100 unit/mL  500 Units Intracatheter Once PRN Curt Bears, MD      . sodium chloride flush (NS) 0.9 % injection 10 mL  10 mL Intracatheter PRN Curt Bears, MD        SURGICAL HISTORY:  Past Surgical History:  Procedure Laterality Date  . APPENDECTOMY    . BACK SURGERY    . CATARACT EXTRACTION, BILATERAL    . CHEST TUBE INSERTION Right 05/20/2019   Procedure: INSERTION PLEURAL DRAINAGE CATHETER;  Surgeon: Ivin Poot, MD;  Location: [facility];  Service: Thoracic;  Laterality: Right;  . COLONOSCOPY    . CORONARY ANGIOPLASTY WITH STENT PLACEMENT     DES Lcx 10/2011, DES LAD 12/2011  . CORONARY ARTERY BYPASS GRAFT  2011   "CABG X3"  . CORONARY STENT INTERVENTION N/A 05/26/2019   Procedure: CORONARY STENT INTERVENTION;  Surgeon: Nelva Bush, MD;  Location: [facility];  Service: Cardiovascular;  Laterality: N/A;  . GASTROC RECESSION EXTREMITY Right 07/06/2015   Pasty Spillers (orthopedics- [place], [place])  . HERNIA REPAIR    . HIATAL HERNIA REPAIR    . IR IMAGING GUIDED PORT INSERTION  06/29/2019  . KNEE ARTHROSCOPY Left 06/22/2014   w/I&D  . KNEE ARTHROSCOPY Left 06/22/2014   Procedure:  IRRIGATION AND DEBRIDEMENT WITH CHRONDROPLASTY;  Surgeon: Hessie Dibble, MD;  Location: [facility];  Service: Orthopedics;  Laterality: Left;  . LEFT HEART CATH AND CORS/GRAFTS ANGIOGRAPHY N/A 05/26/2019   Procedure: LEFT HEART CATH AND CORS/GRAFTS ANGIOGRAPHY;  Surgeon: Nelva Bush, MD;  Location: [facility];  Service: Cardiovascular;  Laterality: N/A;  . LUMBAR DISC SURGERY  1960's?  . MULTIPLE TOOTH EXTRACTIONS    . PLEURAL BIOPSY Right 05/20/2019   Procedure: PLEURAL BIOPSY;  Surgeon: Ivin Poot, MD;  Location: [facility];  Service: Thoracic;  Laterality: Right;  . SHOULDER ARTHROSCOPY Right 04/09/2017   Procedure: ARTHROSCOPY SHOULDER;  Surgeon: Melrose Nakayama, MD;  Location: [facility];  Service: Orthopedics;  Laterality: Right;  . STERNAL CLOSURE     "wires from OHS taken out; plate put in" (05/23/7034)  . SYNOVECTOMY Left 08/17/2014   Procedure: SYNOVECTOMY LEFT KNEE;  Surgeon: Hessie Dibble, MD;  Location: [facility];  Service: Orthopedics;  Laterality: Left;  . TOTAL ANKLE ARTHROPLASTY Right 07/06/2015   Pasty Spillers (orthopedics- [facility])  . TOTAL HIP ARTHROPLASTY Right 06/29/2018   Procedure: RIGHT TOTAL HIP ARTHROPLASTY ANTERIOR APPROACH;  Surgeon: Melrose Nakayama, MD;  Location: [facility];  Service: Orthopedics;  Laterality: Right;  . TOTAL HIP ARTHROPLASTY Left 06/09/2016   Scott Streater Claiborne Billings (orthopedics- [place])  . TOTAL KNEE ARTHROPLASTY Left 11/24/2014   Procedure: TOTAL KNEE ARTHROPLASTY;  Surgeon: Hessie Dibble, MD;  Location: [facility];  Service: Orthopedics;  Laterality: Left;  [name] Kitchen VIDEO ASSISTED THORACOSCOPY Right 05/20/2019   Procedure: VIDEO ASSISTED THORACOSCOPY;  Surgeon: Ivin Poot, MD;  Location: [facility];  Service: Thoracic;  Laterality: Right;    REVIEW OF SYSTEMS:   Review of Systems  Constitutional: Positive for fatigue. Negative for appetite change, chills,  fever and unexpected weight change.  HENT: Negative for mouth sores, nosebleeds, sore throat and  trouble swallowing.   Eyes: Negative for eye problems and icterus.  Respiratory: Positive for shortness of breath. Negative for cough, hemoptysis, and wheezing.   Cardiovascular: Negative for chest pain and leg swelling.  Gastrointestinal: Positive for constipation. Negative for abdominal pain, diarrhea, nausea and vomiting.  Genitourinary: Negative for bladder incontinence, difficulty urinating, dysuria, frequency and hematuria.   Musculoskeletal: Negative for back pain, gait problem, neck pain and neck stiffness.  Skin: Negative for itching and rash.  Neurological: Positive for resting tremor from his Parkinson's disease. Negative for dizziness, extremity weakness, gait problem, headaches, light-headedness and seizures.  Hematological: Negative for adenopathy. Does not bruise/bleed easily.  Psychiatric/Behavioral: Negative for confusion, depression and sleep disturbance. The patient is not nervous/anxious.     PHYSICAL EXAMINATION:  Blood pressure 121/65, pulse 76, temperature 98.3 F (36.8 C), temperature source Temporal, resp. rate 18, height 6' (1.829 m), weight 197 lb 4.8 oz (89.5 kg), SpO2 100 %.  ECOG PERFORMANCE STATUS: 1 - Symptomatic but completely ambulatory  Physical Exam  Constitutional: Oriented to person, place, and time and elderly appearing male and in no distress.  HENT:  Head: Normocephalic and atraumatic.  Mouth/Throat: Oropharynx is clear and moist. No oropharyngeal exudate.  Eyes: Conjunctivae are normal. Right eye exhibits no discharge. Left eye exhibits no discharge. No scleral icterus.  Neck: Normal range of motion. Neck supple.  Cardiovascular: Systolic murmur noted. Normal rate, regular rhythm, and intact distal pulses.   Pulmonary/Chest: Effort normal. Decreased breath sounds on lower right lobe. No respiratory distress. No wheezes. No rales.  Abdominal: Soft. Bowel sounds are normal. Exhibits no distension and no mass. There is no tenderness.   Musculoskeletal: Normal range of motion. Exhibits no edema.  Lymphadenopathy:    No cervical adenopathy.  Neurological: Resting tremor noted on exam. Alert and oriented to person, place, and time. Exhibits normal muscle tone. Skin: Skin is warm and dry. No rash noted. Not diaphoretic. No erythema. No pallor.  Psychiatric: Mood, memory and judgment normal.  Vitals reviewed.  LABORATORY DATA: Lab Results  Component Value Date   WBC 6.5 10/11/2019   HGB 8.2 (L) 10/11/2019   HCT 25.8 (L) 10/11/2019   MCV 99.2 10/11/2019   PLT 292 10/11/2019      Chemistry      Component Value Date/Time   NA 142  10/11/2019 0942   K 5.2 (H) 10/11/2019 0942   CL 107 10/11/2019 0942   CO2 20 (L) 10/11/2019 0942   BUN 23 10/11/2019 0942   CREATININE 1.17 10/11/2019 0942   CREATININE 1.79 (H) 10/12/2014 1136      Component Value Date/Time   CALCIUM 8.9 10/11/2019 0942   ALKPHOS 65 10/11/2019 0942   AST 12 (L) 10/11/2019 0942   ALT 9 10/11/2019 0942   BILITOT 0.3 10/11/2019 0942       RADIOGRAPHIC STUDIES:  Dg Chest 2 View  Result Date: 09/21/2019 CLINICAL DATA:  Pleural effusion, right VATS 05/20/2019. EXAM: CHEST - 2 VIEW COMPARISON:  CT chest 09/12/2019 and chest radiograph 09/07/2019. FINDINGS: Trachea is midline. Heart size stable. Thoracic aorta is calcified. Right IJ power port tip projects over the low SVC or SVC RA junction. There is a small partially loculated right pleural effusion with a small bore catheter in the base of the right hemithorax. Associated volume loss in the right lower lobe. Tiny left pleural effusion. Left lung is otherwise clear. Degenerative changes in the left shoulder. IMPRESSION: 1. Small loculated right pleural effusion with small bore catheter in the base of the right hemithorax. Associated volume loss in the right lung base. 2. Tiny left pleural effusion. 3.  Aortic atherosclerosis (ICD10-170.0). Electronically Signed   By: Lorin Picket M.D.   On: 09/21/2019  14:51   Ct Angio Chest Pe W And/or Wo Contrast  Result Date: 09/12/2019 CLINICAL DATA:  Shortness of breath with cough and weakness on exertion for 3 days. History of lung cancer. Clinical suspicion of pulmonary embolism. EXAM: CT ANGIOGRAPHY CHEST WITH CONTRAST TECHNIQUE: Multidetector CT imaging of the chest was performed using the standard protocol during bolus administration of intravenous contrast. Multiplanar CT image reconstructions and MIPs were obtained to evaluate the vascular anatomy. CONTRAST:  149mL OMNIPAQUE IOHEXOL 350 MG/ML SOLN COMPARISON:  Chest CT 08/21/2019. FINDINGS: Cardiovascular: The pulmonary arteries are well opacified with contrast to the level of the subsegmental branches. There is a small linear filling defect within a right lower lobe segmental branch (image 71/7), unchanged from the previous study and potentially related to previous pulmonary embolism. No evidence of acute pulmonary embolism. There is diffuse atherosclerosis of the aorta, great vessels and coronary arteries status post median sternotomy and CABG. There are calcifications of the aortic valve. Right IJ Port-A-Cath extends to the superior cavoatrial junction. The heart is mildly enlarged. No significant pericardial effusion. Mediastinum/Nodes: Interval development of mildly enlarged mediastinal lymph nodes, including an 11 mm left paratracheal node on image 24/7, a 17 mm AP window node on image 37/7 and a 13 mm subcarinal node on image 51/7. The thyroid gland, trachea and esophagus demonstrate no significant findings. Lungs/Pleura: Right pleural drain remains in place. The small right-sided pleural effusion is unchanged in volume. There is a new small dependent left pleural effusion. There are pleural calcifications bilaterally. There are new patchy perihilar ground-glass opacities within the left upper and lower lobes. Mild dependent atelectasis at both lung bases is stable. There is no confluent airspace opacity or  suspicious nodularity. Upper abdomen: The visualized upper abdomen appears stable without significant findings. Musculoskeletal/Chest wall: There is no chest wall mass or suspicious osseous finding. Stable nonunion of the previous median sternotomy without bone destruction or diastasis. Review of the MIP images confirms the above findings. IMPRESSION: 1. No evidence of acute pulmonary embolism. There is a stable small linear filling defect within a right lower lobe pulmonary arterial branch  which may be related to remote pulmonary embolism. 2. New patchy perihilar ground-glass opacities on the left and left pleural effusion, potentially inflammatory. No new findings in the right hemithorax. 3. New mildly enlarged mediastinal lymph nodes, likely reactive. 4. Extensive coronary and Aortic Atherosclerosis (ICD10-I70.0). Electronically Signed   By: Richardean Sale M.D.   On: 09/12/2019 12:11     ASSESSMENT/PLAN:  This is a very pleasant 85 year old Caucasian male diagnosed with malignant pleural mesothelioma,epitheloid typeinvolving the right hemithorax. He was diagnosed in May 2020.   The patient is on systemic chemotherapy with carboplatinfor an AUC of 5 and Alimta 500mg /m. He is status post 4 cycles. The patient had been tolerating treatment well except for fatigue.   The patient was seen with Dr. Julien Nordmann today. Labs were reviewed. He will receive cycle #5 today as scheduled. His labs show anemia. The patient is symptomatic. We will arrange for him to receive 1 unit of pRBCs while in the clinic today.   We will see the patient back for a follow up visit in 3 weeks for evaluation before starting cycle #6.   The patient was advised to call immediately if he has any concerning symptoms in the interval. The patient voices understanding of current disease status and treatment options and is in agreement with the current care plan. All questions were answered. The patient knows to call the clinic  with any problems, questions or concerns. We can certainly see the patient much sooner if necessary.  No orders of the defined types were placed in this encounter.    Jeffrey Blann L Eleonore Shippee, PA-C 10/11/19  ADDENDUM: Hematology/Oncology Attending: I had a face-to-face encounter with the patient today.  I recommended his care plan.  This is a very pleasant 85 years old white male with malignant pleural mesothelioma, epithelioid type involving the right hemothorax. The patient is currently undergoing systemic chemotherapy with carboplatin, and Alimta status post 4 cycles.  He has been tolerating this treatment well except for fatigue secondary to chemotherapy-induced anemia. I recommended for the patient to proceed with cycle #5 today as planned. For the chemotherapy-induced anemia, we will arrange for the patient to receive at least 1 unit of PRBCs transfusion today. He will come back for follow-up visit in 3 weeks for evaluation before starting cycle #6. The patient was advised to call immediately if he has any concerning symptoms in the interval.  Disclaimer: This note was dictated with voice recognition software. Similar sounding words can inadvertently be transcribed and may be missed upon review. Eilleen Kempf, MD 10/11/19

## 2019-10-11 NOTE — Patient Instructions (Signed)
[facility] Discharge Instructions for Patients Receiving Chemotherapy  Today you received the following chemotherapy agents: Alimta, Carboplatin  To help prevent nausea and vomiting after your treatment, we encourage you to take your nausea medication as directed.   If you develop nausea and vomiting that is not controlled by your nausea medication, call the clinic.   BELOW ARE SYMPTOMS THAT SHOULD BE REPORTED IMMEDIATELY:  *FEVER GREATER THAN 100.5 F  *CHILLS WITH OR WITHOUT FEVER  NAUSEA AND VOMITING THAT IS NOT CONTROLLED WITH YOUR NAUSEA MEDICATION  *UNUSUAL SHORTNESS OF BREATH  *UNUSUAL BRUISING OR BLEEDING  TENDERNESS IN MOUTH AND THROAT WITH OR WITHOUT PRESENCE OF ULCERS  *URINARY PROBLEMS  *BOWEL PROBLEMS  UNUSUAL RASH Items with * indicate a potential emergency and should be followed up as soon as possible.  Feel free to call the clinic should you have any questions or concerns. The clinic phone number is [phone].  Please show the [facility] at check-in to the Emergency Department and triage nurse.

## 2019-10-11 NOTE — Telephone Encounter (Signed)
Verbal order received from Cassie for 1 unit blood. Order entered and infusion nurse aware to draw sample.

## 2019-10-11 NOTE — Patient Instructions (Signed)
It is common for patients who are undergoing treatment and taking certain prescribed medications to experience side-effects with constipation.  If you experience constipation, please take stool softener such as Colace or Senna one tablet twice a day everyday to avoid constipation.  These medications are available over the counter.  Of course, if you have diarrhea, stop taking stool softeners.  Drinking plenty of fluid, eating fruits and vegetable, and being active also reduces the risk of constipation.   If despite taking stool softeners, and you still have no bowel movement for 2 days or more than your normal bowel habit frequency, please take one of the following over the counter laxatives:  MiraLax, Milk of Magnesia or Mag Citrate everyday. The goal is to have at least one bowel movement every other day.    Constipation, Adult Constipation is when a person:  Poops (has a bowel movement) fewer times in a week than normal.  Has a hard time pooping.  Has poop that is dry, hard, or bigger than normal. Follow these instructions at home: Eating and drinking   Eat foods that have a lot of fiber, such as: ? Fresh fruits and vegetables. ? Whole grains. ? Beans.  Eat less of foods that are high in fat, low in fiber, or overly processed, such as: ? [place] fries. ? Hamburgers. ? Cookies. ? Candy. ? Soda.  Drink enough fluid to keep your pee (urine) clear or pale yellow. General instructions  Exercise regularly or as told by your doctor.  Go to the restroom when you feel like you need to poop. Do not hold it in.  Take over-the-counter and prescription medicines only as told by your doctor. These include any fiber supplements.  Do pelvic floor retraining exercises, such as: ? Doing deep breathing while relaxing your lower belly (abdomen). ? Relaxing your pelvic floor while pooping.  Watch your condition for any changes.  Keep all follow-up visits as told by your doctor. This is  important. Contact a doctor if:  You have pain that gets worse.  You have a fever.  You have not pooped for 4 days.  You throw up (vomit).  You are not hungry.  You lose weight.  You are bleeding from the anus.  You have thin, pencil-like poop (stool). Get help right away if:  You have a fever, and your symptoms suddenly get worse.  You leak poop or have blood in your poop.  Your belly feels hard or bigger than normal (is bloated).  You have very bad belly pain.  You feel dizzy or you faint. This information is not intended to replace advice given to you by your health care provider. Make sure you discuss any questions you have with your health care provider. Document Released: 05/26/2008 Document Revised: 11/20/2017 Document Reviewed: 05/28/2016 Elsevier Patient Education  2020 [employer].

## 2019-10-12 ENCOUNTER — Telehealth: Payer: Self-pay | Admitting: Physician Assistant

## 2019-10-12 LAB — TYPE AND SCREEN
ABO/RH(D): A POS
Antibody Screen: NEGATIVE
Unit division: 0

## 2019-10-12 LAB — BPAM RBC
Blood Product Expiration Date: 202011112359
ISSUE DATE / TIME: 202010201324
Unit Type and Rh: 6200

## 2019-10-12 NOTE — Telephone Encounter (Signed)
Per 10/20 los appt already scheduled.

## 2019-10-12 NOTE — Progress Notes (Signed)
Jeffrey [name] was seen in the infusion room today as he was receiving chemotherapy.  The patient has a history of mesothelioma.  He has had hiccups all morning.  He requests a medication for his hiccups.  He states that he has an medication at home which he uses for this.  He was given Thorazine 25 mg p.o. x1.  Sandi Mealy, MHS, PA-C Physician Assistant

## 2019-10-13 ENCOUNTER — Other Ambulatory Visit: Payer: Self-pay | Admitting: Internal Medicine

## 2019-10-17 ENCOUNTER — Other Ambulatory Visit: Payer: Self-pay | Admitting: Medical Oncology

## 2019-10-17 ENCOUNTER — Telehealth: Payer: Self-pay | Admitting: Medical Oncology

## 2019-10-17 DIAGNOSIS — R042 Hemoptysis: Secondary | ICD-10-CM

## 2019-10-17 NOTE — Telephone Encounter (Signed)
Hemoptysis - x 1 week. Described as streaks of blood in clear sputum  . Denies pain or other symptoms.Has lab appt tomorrow . I instructed pt to keep appt tomorrow and to go to ED if bleeding increases between now an then. Clot to hold added to labs for tomorrow.

## 2019-10-18 ENCOUNTER — Other Ambulatory Visit: Payer: Self-pay | Admitting: Internal Medicine

## 2019-10-18 ENCOUNTER — Other Ambulatory Visit: Payer: Self-pay

## 2019-10-18 ENCOUNTER — Inpatient Hospital Stay: Payer: Medicare Other

## 2019-10-18 ENCOUNTER — Ambulatory Visit: Payer: Medicare Other

## 2019-10-18 ENCOUNTER — Ambulatory Visit: Payer: Medicare Other | Admitting: Internal Medicine

## 2019-10-18 ENCOUNTER — Telehealth: Payer: Self-pay | Admitting: Medical Oncology

## 2019-10-18 DIAGNOSIS — K121 Other forms of stomatitis: Secondary | ICD-10-CM

## 2019-10-18 DIAGNOSIS — R042 Hemoptysis: Secondary | ICD-10-CM

## 2019-10-18 DIAGNOSIS — C450 Mesothelioma of pleura: Secondary | ICD-10-CM | POA: Diagnosis not present

## 2019-10-18 LAB — CBC WITH DIFFERENTIAL (CANCER CENTER ONLY)
Abs Immature Granulocytes: 0.02 10*3/uL (ref 0.00–0.07)
Basophils Absolute: 0 10*3/uL (ref 0.0–0.1)
Basophils Relative: 0 %
Eosinophils Absolute: 0.1 10*3/uL (ref 0.0–0.5)
Eosinophils Relative: 3 %
HCT: 31.5 % — ABNORMAL LOW (ref 39.0–52.0)
Hemoglobin: 10 g/dL — ABNORMAL LOW (ref 13.0–17.0)
Immature Granulocytes: 1 %
Lymphocytes Relative: 40 %
Lymphs Abs: 1.2 10*3/uL (ref 0.7–4.0)
MCH: 30.4 pg (ref 26.0–34.0)
MCHC: 31.7 g/dL (ref 30.0–36.0)
MCV: 95.7 fL (ref 80.0–100.0)
Monocytes Absolute: 0.2 10*3/uL (ref 0.1–1.0)
Monocytes Relative: 5 %
Neutro Abs: 1.5 10*3/uL — ABNORMAL LOW (ref 1.7–7.7)
Neutrophils Relative %: 51 %
Platelet Count: 177 10*3/uL (ref 150–400)
RBC: 3.29 MIL/uL — ABNORMAL LOW (ref 4.22–5.81)
RDW: 17.2 % — ABNORMAL HIGH (ref 11.5–15.5)
WBC Count: 3 10*3/uL — ABNORMAL LOW (ref 4.0–10.5)
nRBC: 0 % (ref 0.0–0.2)

## 2019-10-18 LAB — CMP (CANCER CENTER ONLY)
ALT: 14 U/L (ref 0–44)
AST: 19 U/L (ref 15–41)
Albumin: 3.1 g/dL — ABNORMAL LOW (ref 3.5–5.0)
Alkaline Phosphatase: 66 U/L (ref 38–126)
Anion gap: 10 (ref 5–15)
BUN: 19 mg/dL (ref 8–23)
CO2: 27 mmol/L (ref 22–32)
Calcium: 9.3 mg/dL (ref 8.9–10.3)
Chloride: 103 mmol/L (ref 98–111)
Creatinine: 1 mg/dL (ref 0.61–1.24)
GFR, Est AFR Am: >60 mL/min (ref >60)
GFR, Estimated: >60 mL/min (ref >60)
Glucose, Bld: 118 mg/dL — ABNORMAL HIGH (ref 70–99)
Potassium: 4.3 mmol/L (ref 3.5–5.1)
Sodium: 140 mmol/L (ref 135–145)
Total Bilirubin: 0.4 mg/dL (ref 0.3–1.2)
Total Protein: 7.1 g/dL (ref 6.5–8.1)

## 2019-10-18 LAB — SAMPLE TO BLOOD BANK

## 2019-10-18 MED ORDER — BENZONATATE 100 MG PO CAPS: 100.0000 mg | ORAL_CAPSULE | Freq: Three times a day (TID) | ORAL | 0 refills | Status: DC

## 2019-10-18 NOTE — Telephone Encounter (Signed)
Tessalon ordered.  Okay for the Magic mouthwash.

## 2019-10-18 NOTE — Telephone Encounter (Addendum)
Labs /Hemoptysis--CBC/diff Labs reviewed with Le[facility], pt and wife. No change in blood streaks in sputum. First cough of am produces  a small clot. Pt instructed to  call for fever, chills , increased hemoptysis , clots. Pt also on Xarelto . "Mouth Sore" Pt pinpoints to side of neck ( ?area is deep in pharynx-) pain with swallowing-"- I am unable to see any sore in back of throat. Will ask Mohamed to advise re MMW.  Cough- For cough ,I instructed wife (per [facility])  to take Mucinex as prescribed with water to thin secretions, try delysm OTC . Has one more Tessalon pearl. I will ask abut refill for tessalon .

## 2019-10-19 MED ORDER — BENZONATATE 100 MG PO CAPS: 100.0000 mg | ORAL_CAPSULE | Freq: Three times a day (TID) | ORAL | 0 refills | Status: DC

## 2019-10-19 MED ORDER — MAGIC MOUTHWASH: 5.0000 mL | Freq: Four times a day (QID) | ORAL | 1 refills | Status: DC

## 2019-10-19 NOTE — Addendum Note (Signed)
Addended by: Ardeen Garland on: 10/19/2019 10:02 AM   Modules accepted: Orders

## 2019-10-19 NOTE — Addendum Note (Signed)
Addended by: Ardeen Garland on: 10/19/2019 11:35 AM   Modules accepted: Orders

## 2019-10-25 ENCOUNTER — Other Ambulatory Visit: Payer: Self-pay

## 2019-10-25 ENCOUNTER — Inpatient Hospital Stay: Payer: Medicare Other | Attending: Internal Medicine

## 2019-10-25 DIAGNOSIS — R05 Cough: Secondary | ICD-10-CM | POA: Insufficient documentation

## 2019-10-25 DIAGNOSIS — Z7982 Long term (current) use of aspirin: Secondary | ICD-10-CM | POA: Insufficient documentation

## 2019-10-25 DIAGNOSIS — I350 Nonrheumatic aortic (valve) stenosis: Secondary | ICD-10-CM | POA: Diagnosis not present

## 2019-10-25 DIAGNOSIS — E7800 Pure hypercholesterolemia, unspecified: Secondary | ICD-10-CM | POA: Insufficient documentation

## 2019-10-25 DIAGNOSIS — I252 Old myocardial infarction: Secondary | ICD-10-CM | POA: Insufficient documentation

## 2019-10-25 DIAGNOSIS — C450 Mesothelioma of pleura: Secondary | ICD-10-CM | POA: Insufficient documentation

## 2019-10-25 DIAGNOSIS — M129 Arthropathy, unspecified: Secondary | ICD-10-CM | POA: Insufficient documentation

## 2019-10-25 DIAGNOSIS — F418 Other specified anxiety disorders: Secondary | ICD-10-CM | POA: Insufficient documentation

## 2019-10-25 DIAGNOSIS — K5900 Constipation, unspecified: Secondary | ICD-10-CM | POA: Insufficient documentation

## 2019-10-25 DIAGNOSIS — T451X5A Adverse effect of antineoplastic and immunosuppressive drugs, initial encounter: Secondary | ICD-10-CM | POA: Diagnosis not present

## 2019-10-25 DIAGNOSIS — Z7901 Long term (current) use of anticoagulants: Secondary | ICD-10-CM | POA: Diagnosis not present

## 2019-10-25 DIAGNOSIS — E039 Hypothyroidism, unspecified: Secondary | ICD-10-CM | POA: Diagnosis not present

## 2019-10-25 DIAGNOSIS — J90 Pleural effusion, not elsewhere classified: Secondary | ICD-10-CM | POA: Diagnosis not present

## 2019-10-25 DIAGNOSIS — I255 Ischemic cardiomyopathy: Secondary | ICD-10-CM | POA: Diagnosis not present

## 2019-10-25 DIAGNOSIS — D6481 Anemia due to antineoplastic chemotherapy: Secondary | ICD-10-CM | POA: Diagnosis not present

## 2019-10-25 DIAGNOSIS — Z5111 Encounter for antineoplastic chemotherapy: Secondary | ICD-10-CM | POA: Insufficient documentation

## 2019-10-25 DIAGNOSIS — I2510 Atherosclerotic heart disease of native coronary artery without angina pectoris: Secondary | ICD-10-CM | POA: Diagnosis not present

## 2019-10-25 DIAGNOSIS — R5383 Other fatigue: Secondary | ICD-10-CM | POA: Diagnosis not present

## 2019-10-25 DIAGNOSIS — M069 Rheumatoid arthritis, unspecified: Secondary | ICD-10-CM | POA: Diagnosis not present

## 2019-10-25 DIAGNOSIS — K219 Gastro-esophageal reflux disease without esophagitis: Secondary | ICD-10-CM | POA: Diagnosis not present

## 2019-10-25 DIAGNOSIS — G4730 Sleep apnea, unspecified: Secondary | ICD-10-CM | POA: Diagnosis not present

## 2019-10-25 DIAGNOSIS — Z79899 Other long term (current) drug therapy: Secondary | ICD-10-CM | POA: Insufficient documentation

## 2019-10-25 DIAGNOSIS — I10 Essential (primary) hypertension: Secondary | ICD-10-CM | POA: Diagnosis not present

## 2019-10-25 LAB — CMP (CANCER CENTER ONLY)
ALT: 11 U/L (ref 0–44)
AST: 12 U/L — ABNORMAL LOW (ref 15–41)
Albumin: 2.9 g/dL — ABNORMAL LOW (ref 3.5–5.0)
Alkaline Phosphatase: 67 U/L (ref 38–126)
Anion gap: 10 (ref 5–15)
BUN: 11 mg/dL (ref 8–23)
CO2: 27 mmol/L (ref 22–32)
Calcium: 9 mg/dL (ref 8.9–10.3)
Chloride: 105 mmol/L (ref 98–111)
Creatinine: 0.94 mg/dL (ref 0.61–1.24)
GFR, Est AFR Am: >60 mL/min (ref >60)
GFR, Estimated: >60 mL/min (ref >60)
Glucose, Bld: 103 mg/dL — ABNORMAL HIGH (ref 70–99)
Potassium: 4.2 mmol/L (ref 3.5–5.1)
Sodium: 142 mmol/L (ref 135–145)
Total Bilirubin: 0.3 mg/dL (ref 0.3–1.2)
Total Protein: 6.9 g/dL (ref 6.5–8.1)

## 2019-10-25 LAB — CBC WITH DIFFERENTIAL (CANCER CENTER ONLY)
Abs Immature Granulocytes: 0.01 10*3/uL (ref 0.00–0.07)
Basophils Absolute: 0 10*3/uL (ref 0.0–0.1)
Basophils Relative: 0 %
Eosinophils Absolute: 0.2 10*3/uL (ref 0.0–0.5)
Eosinophils Relative: 4 %
HCT: 28.3 % — ABNORMAL LOW (ref 39.0–52.0)
Hemoglobin: 9 g/dL — ABNORMAL LOW (ref 13.0–17.0)
Immature Granulocytes: 0 %
Lymphocytes Relative: 36 %
Lymphs Abs: 1.4 10*3/uL (ref 0.7–4.0)
MCH: 31.5 pg (ref 26.0–34.0)
MCHC: 31.8 g/dL (ref 30.0–36.0)
MCV: 99 fL (ref 80.0–100.0)
Monocytes Absolute: 0.6 10*3/uL (ref 0.1–1.0)
Monocytes Relative: 15 %
Neutro Abs: 1.7 10*3/uL (ref 1.7–7.7)
Neutrophils Relative %: 45 %
Platelet Count: 70 10*3/uL — ABNORMAL LOW (ref 150–400)
RBC: 2.86 MIL/uL — ABNORMAL LOW (ref 4.22–5.81)
RDW: 17.2 % — ABNORMAL HIGH (ref 11.5–15.5)
WBC Count: 3.9 10*3/uL — ABNORMAL LOW (ref 4.0–10.5)
nRBC: 0 % (ref 0.0–0.2)

## 2019-11-01 ENCOUNTER — Inpatient Hospital Stay (HOSPITAL_BASED_OUTPATIENT_CLINIC_OR_DEPARTMENT_OTHER): Payer: Medicare Other | Admitting: Physician Assistant

## 2019-11-01 ENCOUNTER — Inpatient Hospital Stay: Payer: Medicare Other

## 2019-11-01 ENCOUNTER — Other Ambulatory Visit: Payer: Self-pay

## 2019-11-01 ENCOUNTER — Other Ambulatory Visit: Payer: Medicare Other

## 2019-11-01 ENCOUNTER — Encounter: Payer: Self-pay | Admitting: Physician Assistant

## 2019-11-01 VITALS — BP 141/83 | HR 66 | Temp 98.5°F | Resp 16

## 2019-11-01 VITALS — BP 126/69 | HR 75 | Temp 98.3°F | Resp 17 | Ht 73.0 in | Wt 191.1 lb

## 2019-11-01 DIAGNOSIS — T451X5A Adverse effect of antineoplastic and immunosuppressive drugs, initial encounter: Secondary | ICD-10-CM

## 2019-11-01 DIAGNOSIS — D6481 Anemia due to antineoplastic chemotherapy: Secondary | ICD-10-CM

## 2019-11-01 DIAGNOSIS — Z5111 Encounter for antineoplastic chemotherapy: Secondary | ICD-10-CM | POA: Diagnosis not present

## 2019-11-01 DIAGNOSIS — C450 Mesothelioma of pleura: Secondary | ICD-10-CM

## 2019-11-01 LAB — CMP (CANCER CENTER ONLY)
ALT: 6 U/L (ref 0–44)
AST: 11 U/L — ABNORMAL LOW (ref 15–41)
Albumin: 2.8 g/dL — ABNORMAL LOW (ref 3.5–5.0)
Alkaline Phosphatase: 60 U/L (ref 38–126)
Anion gap: 9 (ref 5–15)
BUN: 17 mg/dL (ref 8–23)
CO2: 27 mmol/L (ref 22–32)
Calcium: 8.9 mg/dL (ref 8.9–10.3)
Chloride: 106 mmol/L (ref 98–111)
Creatinine: 1.08 mg/dL (ref 0.61–1.24)
GFR, Est AFR Am: >60 mL/min (ref >60)
GFR, Estimated: >60 mL/min (ref >60)
Glucose, Bld: 150 mg/dL — ABNORMAL HIGH (ref 70–99)
Potassium: 4.7 mmol/L (ref 3.5–5.1)
Sodium: 142 mmol/L (ref 135–145)
Total Bilirubin: <0.2 mg/dL — ABNORMAL LOW (ref 0.3–1.2)
Total Protein: 7 g/dL (ref 6.5–8.1)

## 2019-11-01 LAB — CBC WITH DIFFERENTIAL (CANCER CENTER ONLY)
Abs Immature Granulocytes: 0.03 10*3/uL (ref 0.00–0.07)
Basophils Absolute: 0 10*3/uL (ref 0.0–0.1)
Basophils Relative: 0 %
Eosinophils Absolute: 0 10*3/uL (ref 0.0–0.5)
Eosinophils Relative: 0 %
HCT: 27 % — ABNORMAL LOW (ref 39.0–52.0)
Hemoglobin: 8.5 g/dL — ABNORMAL LOW (ref 13.0–17.0)
Immature Granulocytes: 1 %
Lymphocytes Relative: 22 %
Lymphs Abs: 1.1 10*3/uL (ref 0.7–4.0)
MCH: 31 pg (ref 26.0–34.0)
MCHC: 31.5 g/dL (ref 30.0–36.0)
MCV: 98.5 fL (ref 80.0–100.0)
Monocytes Absolute: 1.1 10*3/uL — ABNORMAL HIGH (ref 0.1–1.0)
Monocytes Relative: 23 %
Neutro Abs: 2.6 10*3/uL (ref 1.7–7.7)
Neutrophils Relative %: 54 %
Platelet Count: 295 10*3/uL (ref 150–400)
RBC: 2.74 MIL/uL — ABNORMAL LOW (ref 4.22–5.81)
RDW: 17 % — ABNORMAL HIGH (ref 11.5–15.5)
WBC Count: 4.9 10*3/uL (ref 4.0–10.5)
nRBC: 0 % (ref 0.0–0.2)

## 2019-11-01 LAB — PREPARE RBC (CROSSMATCH)

## 2019-11-01 MED ORDER — SODIUM CHLORIDE 0.9 % IV SOLN
343.6000 mg | Freq: Once | INTRAVENOUS | Status: AC
  Administered 2019-11-01: 340 mg via INTRAVENOUS
  Filled 2019-11-01: qty 34

## 2019-11-01 MED ORDER — DIPHENHYDRAMINE HCL 25 MG PO CAPS
ORAL_CAPSULE | ORAL | Status: AC
  Filled 2019-11-01: qty 1

## 2019-11-01 MED ORDER — CYANOCOBALAMIN 1000 MCG/ML IJ SOLN
1000.0000 ug | Freq: Once | INTRAMUSCULAR | Status: AC
  Administered 2019-11-01: 17:00:00 1000 ug via INTRAMUSCULAR

## 2019-11-01 MED ORDER — DIPHENHYDRAMINE HCL 50 MG/ML IJ SOLN
25.0000 mg | Freq: Once | INTRAMUSCULAR | Status: AC
  Administered 2019-11-01: 25 mg via INTRAVENOUS

## 2019-11-01 MED ORDER — SODIUM CHLORIDE 0.9 % IV SOLN
Freq: Once | INTRAVENOUS | Status: AC
  Administered 2019-11-01: 12:00:00 via INTRAVENOUS
  Filled 2019-11-01: qty 250

## 2019-11-01 MED ORDER — SODIUM CHLORIDE 0.9 % IV SOLN
412.0000 mg/m2 | Freq: Once | INTRAVENOUS | Status: AC
  Administered 2019-11-01: 900 mg via INTRAVENOUS
  Filled 2019-11-01: qty 16

## 2019-11-01 MED ORDER — DIPHENHYDRAMINE HCL 50 MG/ML IJ SOLN
INTRAMUSCULAR | Status: AC
  Filled 2019-11-01: qty 1

## 2019-11-01 MED ORDER — HEPARIN SOD (PORK) LOCK FLUSH 100 UNIT/ML IV SOLN
500.0000 [IU] | Freq: Once | INTRAVENOUS | Status: AC | PRN
  Administered 2019-11-01: 500 [IU]
  Filled 2019-11-01: qty 5

## 2019-11-01 MED ORDER — CYANOCOBALAMIN 1000 MCG/ML IJ SOLN
INTRAMUSCULAR | Status: AC
  Filled 2019-11-01: qty 1

## 2019-11-01 MED ORDER — DEXAMETHASONE 4 MG PO TABS: ORAL_TABLET | ORAL | 1 refills | Status: AC

## 2019-11-01 MED ORDER — SODIUM CHLORIDE 0.9% FLUSH
10.0000 mL | INTRAVENOUS | Status: DC | PRN
  Administered 2019-11-01: 17:00:00 10 mL
  Filled 2019-11-01: qty 10

## 2019-11-01 MED ORDER — PALONOSETRON HCL INJECTION 0.25 MG/5ML
0.2500 mg | Freq: Once | INTRAVENOUS | Status: AC
  Administered 2019-11-01: 0.25 mg via INTRAVENOUS

## 2019-11-01 MED ORDER — ACETAMINOPHEN 325 MG PO TABS
ORAL_TABLET | ORAL | Status: AC
  Filled 2019-11-01: qty 2

## 2019-11-01 MED ORDER — SODIUM CHLORIDE 0.9 % IV SOLN
Freq: Once | INTRAVENOUS | Status: AC
  Administered 2019-11-01: 12:00:00 via INTRAVENOUS
  Filled 2019-11-01: qty 5

## 2019-11-01 MED ORDER — PALONOSETRON HCL INJECTION 0.25 MG/5ML
INTRAVENOUS | Status: AC
  Filled 2019-11-01: qty 5

## 2019-11-01 MED ORDER — ACETAMINOPHEN 325 MG PO TABS
650.0000 mg | ORAL_TABLET | Freq: Once | ORAL | Status: AC
  Administered 2019-11-01: 650 mg via ORAL

## 2019-11-01 NOTE — Patient Instructions (Signed)
[facility] Discharge Instructions for Patients Receiving Chemotherapy  Today you received the following chemotherapy agents: Alimta, Carboplatin  To help prevent nausea and vomiting after your treatment, we encourage you to take your nausea medication as directed.   If you develop nausea and vomiting that is not controlled by your nausea medication, call the clinic.   BELOW ARE SYMPTOMS THAT SHOULD BE REPORTED IMMEDIATELY:  *FEVER GREATER THAN 100.5 F  *CHILLS WITH OR WITHOUT FEVER  NAUSEA AND VOMITING THAT IS NOT CONTROLLED WITH YOUR NAUSEA MEDICATION  *UNUSUAL SHORTNESS OF BREATH  *UNUSUAL BRUISING OR BLEEDING  TENDERNESS IN MOUTH AND THROAT WITH OR WITHOUT PRESENCE OF ULCERS  *URINARY PROBLEMS  *BOWEL PROBLEMS  UNUSUAL RASH Items with * indicate a potential emergency and should be followed up as soon as possible.  Feel free to call the clinic should you have any questions or concerns. The clinic phone number is [phone].  Please show the [facility] at check-in to the Emergency Department and triage nurse.

## 2019-11-01 NOTE — Progress Notes (Signed)
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  PRIOR THERAPY: None  CURRENT THERAPY: Systemic chemotherapy with carboplatin for AUC of 5 and Alimta 500 mg/M2 every 3 weeks. Status post 5 cycles.Starting from cycle #4 he will be treated with carboplatin for AUC of 4 and Alimta 400 mg/M2.  INTERVAL HISTORY: Jeffrey [name] 85 y.o. male returns to the clinic for a follow-up visit.  The patient is feeling fair today without any concerning complaints except for fatigue.  In the interval since his last treatment, the patient noted streaks of blood in his sputum. This has since subsided.  He also called and endorsed some pain with swallowing. He was given a prescription for magic mouthwash and his symptoms have also resolved at this time. He does continue to experience a productive cough. He uses delsym and tessalon perles which helps somewhat. He states his cough is worse with laying back. He has Protonix on his medication list but he is unsure if he is taking it.    Otherwise, he denies any fever, chills, night sweats or weight loss.  He denies any chest pain or worsening shortness of breath. He has a pleurex catheter for a right pleural effusion. It has had a significant amount of drainage recently. It was last drained yesterday and reportedly only had approximately 25 ccs of drainage. He is scheduled to see [name] next week for his pleurex catheter.  He denies any nausea, vomiting, or diarrhea. He takes medications for constipation. He denies any headache or visual changes.  He is here today for evaluation before starting cycle #6.  MEDICAL HISTORY: Past Medical History:  Diagnosis Date  . Anxiety   . Aortic stenosis    mild AS by  07/20/13 echo ([facility])  . Arthritis    "all over"  . Coronary artery disease    NSTEMI 06/2010, CABG x3=> [place]->LAD, SVG->OM1, SVG->PDA, DES LCx 10/2011, DES LAD 12/2011  . Depression   . GERD (gastroesophageal reflux disease)   . H/O hiatal hernia   . Hearing aid worn    B/L  . High cholesterol   . History of blood transfusion reaction 03/21/13   "he almost died; he has to get irradiated blood next time"  . Hypertension   . Hypothyroidism   . Ischemic cardiomyopathy   . ITP (idiopathic thrombocytopenic purpura)    Dr. Gaynelle Arabian, on [place]  . Myocardial infarction ([place]) 03-21-10  . Pneumonia 1940's X 1; Mar 21, 2014 X 1  . Rheumatoid arthritis ([place])   . Shortness of breath    with exertion, has not been very active  . Sleep apnea   . Wears glasses   . Wears partial dentures     ALLERGIES:  is allergic to lipitor [atorvastatin]; methylprednisolone; morphine and related; other; sulfa antibiotics; ciprofloxacin; and doxycycline.  MEDICATIONS:  Current Outpatient Medications  Medication Sig Dispense Refill  . albuterol (VENTOLIN HFA) 108 (90 Base) MCG/ACT inhaler Inhale 1 puff into the lungs 2 (two) times daily as needed for shortness of breath.    . ALPRAZolam (XANAX) 0.25 MG tablet Take 1 tablet (0.25 mg total) by mouth 2 (two) times daily as needed for anxiety. 20 tablet 0  . aspirin EC 81 MG tablet Take 81 mg by mouth daily.    [name] Kitchen  benzonatate (TESSALON) 100 MG capsule Take 1 capsule (100 mg total) by mouth every 8 (eight) hours. 21 capsule 0  . chlorproMAZINE (THORAZINE) 25 MG tablet TAKE 1 TABLET BY MOUTH THREE TIMES DAILY 30 tablet 0  . dexamethasone (DECADRON) 4 MG tablet 4 mg p.o. twice daily the day before, day of and day after chemotherapy every 3 weeks 40 tablet 1  . Fluticasone-Salmeterol (ADVAIR) 100-50 MCG/DOSE AEPB Inhale 1 puff into the lungs 2 (two) times daily.    . folic acid (FOLVITE) 1 MG tablet Take 1 tablet (1 mg total) by mouth daily. 30 tablet 4  .  guaiFENesin (MUCINEX) 600 MG 12 hr tablet Take 1 tablet (600 mg total) by mouth 2 (two) times daily. 60 tablet 1  . ipratropium-albuterol (DUONEB) 0.5-2.5 (3) MG/3ML SOLN Take 3 mLs by nebulization every 6 (six) hours as needed. J44.9 (Patient taking differently: Take 3 mLs by nebulization every 6 (six) hours as needed (shortness of breath). J44.9) 360 mL 3  . levothyroxine (SYNTHROID, LEVOTHROID) 200 MCG tablet Take 200 mcg by mouth See admin instructions. Take 1 tablet Monday through Friday - None on Saturday or Sunday.    . lidocaine-prilocaine (EMLA) cream Apply 1 application topically as needed. 30 g 0  . magic mouthwash SOLN Take 5 mLs by mouth 4 (four) times daily. Components-Benadryl 12.5 mg/77ml , hydrocortisone 60 mg, nystatin 30 ml 240 mL 1  . mirtazapine (REMERON) 15 MG tablet Take 15 mg by mouth at bedtime.    [name] Kitchen omeprazole (PRILOSEC) 40 MG capsule Take 40 mg by mouth at bedtime.     . prochlorperazine (COMPAZINE) 10 MG tablet Take 1 tablet (10 mg total) by mouth every 6 (six) hours as needed for nausea or vomiting. 30 tablet 0  . rivaroxaban (XARELTO) 20 MG TABS tablet Take 20 mg by mouth daily with supper.    . Vitamin D, Ergocalciferol, (DRISDOL) 1.25 MG (50000 UT) CAPS capsule Take 50,000 Units by mouth every 7 (seven) days.     No current facility-administered medications for this visit.    Facility-Administered Medications Ordered in Other Visits  Medication Dose Route Frequency Provider Last Rate Last Dose  . CARBOplatin (PARAPLATIN) 340 mg in sodium chloride 0.9 % 250 mL chemo infusion  340 mg Intravenous Once Curt Bears, MD      . cyanocobalamin ((VITAMIN B-12)) injection 1,000 mcg  1,000 mcg Intramuscular Once Curt Bears, MD      . fosaprepitant (EMEND) 150 mg, dexamethasone (DECADRON) 12 mg in sodium chloride 0.9 % 145 mL IVPB   Intravenous Once Curt Bears, MD 454 mL/hr at 11/01/19 1215    . heparin lock flush 100 unit/mL  500 Units Intracatheter Once PRN  Curt Bears, MD      . PEMEtrexed (ALIMTA) 900 mg in sodium chloride 0.9 % 100 mL chemo infusion  412 mg/m2 (Treatment Plan Recorded) Intravenous Once Curt Bears, MD      . sodium chloride flush (NS) 0.9 % injection 10 mL  10 mL Intracatheter PRN Curt Bears, MD        SURGICAL HISTORY:  Past Surgical History:  Procedure Laterality Date  . APPENDECTOMY    . BACK SURGERY    . CATARACT EXTRACTION, BILATERAL    . CHEST TUBE INSERTION Right 05/20/2019   Procedure: INSERTION PLEURAL DRAINAGE CATHETER;  Surgeon: Ivin Poot, MD;  Location: [facility];  Service: Thoracic;  Laterality: Right;  . COLONOSCOPY    . CORONARY ANGIOPLASTY WITH STENT PLACEMENT  DES Lcx 10/2011, DES LAD 12/2011  . CORONARY ARTERY BYPASS GRAFT  2011   "CABG X3"  . CORONARY STENT INTERVENTION N/A 05/26/2019   Procedure: CORONARY STENT INTERVENTION;  Surgeon: Nelva Bush, MD;  Location: [facility];  Service: Cardiovascular;  Laterality: N/A;  . GASTROC RECESSION EXTREMITY Right 07/06/2015   Pasty Spillers (orthopedics- [place], [place])  . HERNIA REPAIR    . HIATAL HERNIA REPAIR    . IR IMAGING GUIDED PORT INSERTION  06/29/2019  . KNEE ARTHROSCOPY Left 06/22/2014   w/I&D  . KNEE ARTHROSCOPY Left 06/22/2014   Procedure: IRRIGATION AND DEBRIDEMENT WITH CHRONDROPLASTY;  Surgeon: Hessie Dibble, MD;  Location: [facility];  Service: Orthopedics;  Laterality: Left;  . LEFT HEART CATH AND CORS/GRAFTS ANGIOGRAPHY N/A 05/26/2019   Procedure: LEFT HEART CATH AND CORS/GRAFTS ANGIOGRAPHY;  Surgeon: Nelva Bush, MD;  Location: [facility];  Service: Cardiovascular;  Laterality: N/A;  . LUMBAR DISC SURGERY  1960's?  . MULTIPLE TOOTH EXTRACTIONS    . PLEURAL BIOPSY Right 05/20/2019   Procedure: PLEURAL BIOPSY;  Surgeon: Ivin Poot, MD;  Location: [facility];  Service: Thoracic;  Laterality: Right;  . SHOULDER ARTHROSCOPY Right 04/09/2017   Procedure: ARTHROSCOPY SHOULDER;  Surgeon: Melrose Nakayama, MD;   Location: [facility];  Service: Orthopedics;  Laterality: Right;  . STERNAL CLOSURE     "wires from OHS taken out; plate put in" (0/02/4741)  . SYNOVECTOMY Left 08/17/2014   Procedure: SYNOVECTOMY LEFT KNEE;  Surgeon: Hessie Dibble, MD;  Location: [facility];  Service: Orthopedics;  Laterality: Left;  . TOTAL ANKLE ARTHROPLASTY Right 07/06/2015   Pasty Spillers (orthopedics- [facility])  . TOTAL HIP ARTHROPLASTY Right 06/29/2018   Procedure: RIGHT TOTAL HIP ARTHROPLASTY ANTERIOR APPROACH;  Surgeon: Melrose Nakayama, MD;  Location: [facility];  Service: Orthopedics;  Laterality: Right;  . TOTAL HIP ARTHROPLASTY Left 06/09/2016   Scott Streater Claiborne Billings (orthopedics- [place])  . TOTAL KNEE ARTHROPLASTY Left 11/24/2014   Procedure: TOTAL KNEE ARTHROPLASTY;  Surgeon: Hessie Dibble, MD;  Location: [facility];  Service: Orthopedics;  Laterality: Left;  [name] Kitchen VIDEO ASSISTED THORACOSCOPY Right 05/20/2019   Procedure: VIDEO ASSISTED THORACOSCOPY;  Surgeon: Ivin Poot, MD;  Location: [facility];  Service: Thoracic;  Laterality: Right;    REVIEW OF SYSTEMS:   Review of Systems  Constitutional: Positive for fatigue. Negative for appetite change, chills,  fever and unexpected weight change.  HENT: Negative for mouth sores, nosebleeds, sore throat and trouble swallowing.   Eyes: Negative for eye problems and icterus.  Respiratory: Positive for shortness of breath and cough. Negative for hemoptysis and wheezing.   Cardiovascular: Negative for chest pain and leg swelling.  Gastrointestinal: Positive for constipation. Negative for abdominal pain, diarrhea, nausea and vomiting.  Genitourinary: Negative for bladder incontinence, difficulty urinating, dysuria, frequency and hematuria.   Musculoskeletal: Negative for back pain, gait problem, neck pain and neck stiffness.  Skin: Negative for itching and rash.  Neurological: Positive for resting tremor from his Parkinson's disease. Negative for dizziness, extremity weakness, gait  problem, headaches, light-headedness and seizures.  Hematological: Negative for adenopathy. Does not bruise/bleed easily.  Psychiatric/Behavioral: Negative for confusion, depression and sleep disturbance. The patient is not nervous/anxious.   PHYSICAL EXAMINATION:  Blood pressure 126/69, pulse 75, temperature 98.3 F (36.8 C), temperature source Temporal, resp. rate 17, height 6\' 1"  (1.854 m), weight 191 lb 1.6 oz (86.7 kg), SpO2 100 %.  ECOG PERFORMANCE STATUS: 2 - Symptomatic, <50% confined to bed  Physical Exam  Constitutional: Oriented to person, place, and time and elderly appearing male and in no distress.  HENT:  Head: Normocephalic and atraumatic.  Mouth/Throat: Oropharynx is clear and moist. No oropharyngeal exudate.  Eyes: Conjunctivae are normal. Right eye exhibits no discharge. Left eye exhibits no discharge. No scleral icterus.  Neck: Normal range of motion. Neck supple.  Cardiovascular: Systolic murmur noted. Normal rate, regular rhythm, and intact distal pulses.   Pulmonary/Chest: Effort normal. Decreased breath sounds on lower right lobe. No respiratory distress. No wheezes. No rales.  Abdominal: Soft. Bowel sounds are normal. Exhibits no distension and no mass. There is no tenderness.  Musculoskeletal: Normal range of motion. Exhibits no edema.  Lymphadenopathy:    No cervical adenopathy.  Neurological: Resting tremor noted on exam. Alert and oriented to person, place, and time. Exhibits normal muscle tone. Skin: Skin is warm and dry. No rash noted. Not diaphoretic. No erythema. No pallor.  Psychiatric: Mood, memory and judgment normal.  Vitals reviewed.  LABORATORY DATA: Lab Results  Component Value Date   WBC 4.9 11/01/2019   HGB 8.5 (L) 11/01/2019   HCT 27.0 (L) 11/01/2019   MCV 98.5 11/01/2019   PLT 295 11/01/2019      Chemistry      Component Value Date/Time   NA 142 11/01/2019 1102   K 4.7 11/01/2019 1102   CL 106 11/01/2019 1102   CO2 27 11/01/2019  1102   BUN 17 11/01/2019 1102   CREATININE 1.08 11/01/2019 1102   CREATININE 1.79 (H) 10/12/2014 1136      Component Value Date/Time   CALCIUM 8.9 11/01/2019 1102   ALKPHOS 60 11/01/2019 1102   AST 11 (L) 11/01/2019 1102   ALT 6 11/01/2019 1102   BILITOT <0.2 (L) 11/01/2019 1102       RADIOGRAPHIC STUDIES:  No results found.   ASSESSMENT/PLAN:  This is a very pleasant 85 year old Caucasian male diagnosed with malignant pleural mesothelioma,epitheloid typeinvolving the right hemithorax. He was diagnosed in May 2020.   The patient is on systemic chemotherapy with carboplatinfor an AUC of 5 and Alimta 500mg /m. He is status post5cycles. The patient had been tolerating treatment well except for fatigue.   The patient was seen with Dr. Julien Nordmann today. Labs were reviewed. He will receive cycle #6 today as scheduled. His labs show anemia. The patient is symptomatic. He will receive 1 unit of blood while in infusion today.   We will see the patient back for a follow up visit in 3 weeks for evaluation before starting cycle #7.   He will follow up with [name] next week as scheduled.   He will try to use his protonix to see if it helps with his cough which is worse when lying back.   I have sent a refull of his dexamethasone to his pharmacy.   The patient was advised to call immediately if he has any concerning symptoms in the interval. The patient voices understanding of current disease status and treatment options and is in agreement with the current care plan. All questions were answered. The patient knows to call the clinic with any problems, questions or concerns. We can certainly see the patient much sooner if necessary  Orders Placed This Encounter  Procedures  . Practitioner attestation of consent    I, the ordering practitioner, attest that I have discussed with the patient the benefits, risks, side effects, alternatives, likelihood of achieving goals and  potential problems during recovery for the procedure listed.  Standing Status:   Future    Standing Expiration Date:   10/31/2020    Order Specific Question:   Procedure    Answer:   Blood Product(s)  . Complete patient signature process for consent form    Standing Status:   Future    Standing Expiration Date:   10/31/2020  . Care order/instruction    Transfuse Parameters    Standing Status:   Future    Standing Expiration Date:   10/31/2020  . Type and screen    Standing Status:   Future    Number of Occurrences:   1    Standing Expiration Date:   10/31/2020     Tobe Sos Kealie Barrie, PA-C 11/01/19  ADDENDUM: Hematology/Oncology Attending: I had a face-to-face encounter with the patient today.  I recommended his care plan.  This is a very pleasant 85 years old white male with metastatic malignant pleural mesothelioma.  The patient is currently undergoing treatment with systemic chemotherapy with carboplatin, Alimta and Avastin status post 5 cycles.  He has been tolerating his treatment well with no concerning adverse effect except for fatigue secondary to chemotherapy-induced anemia. I recommended for the patient to proceed with cycle #6 today as planned. For the chemotherapy-induced anemia, will arrange for the patient to receive 1 unit of PRBCs transfusion. He will come back for follow-up visit in 3 weeks for evaluation before the next cycle of his treatment. He was advised to call immediately if he has any concerning symptoms in the interval.  Disclaimer: This note was dictated with voice recognition software. Similar sounding words can inadvertently be transcribed and may be missed upon review. Eilleen Kempf, MD 11/01/19

## 2019-11-02 ENCOUNTER — Ambulatory Visit: Payer: Medicare Other | Admitting: Cardiology

## 2019-11-02 LAB — BPAM RBC
Blood Product Expiration Date: 202012082359
ISSUE DATE / TIME: 202011101426
Unit Type and Rh: 6200

## 2019-11-02 LAB — TYPE AND SCREEN
ABO/RH(D): A POS
Antibody Screen: NEGATIVE
Unit division: 0

## 2019-11-08 ENCOUNTER — Other Ambulatory Visit: Payer: Medicare Other

## 2019-11-08 ENCOUNTER — Other Ambulatory Visit: Payer: Self-pay

## 2019-11-08 ENCOUNTER — Inpatient Hospital Stay: Payer: Medicare Other

## 2019-11-08 ENCOUNTER — Ambulatory Visit: Payer: Medicare Other

## 2019-11-08 ENCOUNTER — Ambulatory Visit: Payer: Medicare Other | Admitting: Physician Assistant

## 2019-11-08 DIAGNOSIS — C450 Mesothelioma of pleura: Secondary | ICD-10-CM | POA: Diagnosis not present

## 2019-11-08 LAB — CMP (CANCER CENTER ONLY)
ALT: 15 U/L (ref 0–44)
AST: 17 U/L (ref 15–41)
Albumin: 3.1 g/dL — ABNORMAL LOW (ref 3.5–5.0)
Alkaline Phosphatase: 64 U/L (ref 38–126)
Anion gap: 10 (ref 5–15)
BUN: 15 mg/dL (ref 8–23)
CO2: 27 mmol/L (ref 22–32)
Calcium: 9.2 mg/dL (ref 8.9–10.3)
Chloride: 102 mmol/L (ref 98–111)
Creatinine: 0.98 mg/dL (ref 0.61–1.24)
GFR, Est AFR Am: >60 mL/min (ref >60)
GFR, Estimated: >60 mL/min (ref >60)
Glucose, Bld: 112 mg/dL — ABNORMAL HIGH (ref 70–99)
Potassium: 4.5 mmol/L (ref 3.5–5.1)
Sodium: 139 mmol/L (ref 135–145)
Total Bilirubin: 0.4 mg/dL (ref 0.3–1.2)
Total Protein: 7.2 g/dL (ref 6.5–8.1)

## 2019-11-08 LAB — CBC WITH DIFFERENTIAL (CANCER CENTER ONLY)
Abs Immature Granulocytes: 0.01 10*3/uL (ref 0.00–0.07)
Basophils Absolute: 0 10*3/uL (ref 0.0–0.1)
Basophils Relative: 0 %
Eosinophils Absolute: 0 10*3/uL (ref 0.0–0.5)
Eosinophils Relative: 1 %
HCT: 31.4 % — ABNORMAL LOW (ref 39.0–52.0)
Hemoglobin: 9.9 g/dL — ABNORMAL LOW (ref 13.0–17.0)
Immature Granulocytes: 0 %
Lymphocytes Relative: 51 %
Lymphs Abs: 1.3 10*3/uL (ref 0.7–4.0)
MCH: 30.1 pg (ref 26.0–34.0)
MCHC: 31.5 g/dL (ref 30.0–36.0)
MCV: 95.4 fL (ref 80.0–100.0)
Monocytes Absolute: 0.1 10*3/uL (ref 0.1–1.0)
Monocytes Relative: 5 %
Neutro Abs: 1.1 10*3/uL — ABNORMAL LOW (ref 1.7–7.7)
Neutrophils Relative %: 43 %
Platelet Count: 183 10*3/uL (ref 150–400)
RBC: 3.29 MIL/uL — ABNORMAL LOW (ref 4.22–5.81)
RDW: 17.5 % — ABNORMAL HIGH (ref 11.5–15.5)
WBC Count: 2.6 10*3/uL — ABNORMAL LOW (ref 4.0–10.5)
nRBC: 0 % (ref 0.0–0.2)

## 2019-11-09 ENCOUNTER — Other Ambulatory Visit: Payer: Self-pay | Admitting: *Deleted

## 2019-11-09 ENCOUNTER — Ambulatory Visit (INDEPENDENT_AMBULATORY_CARE_PROVIDER_SITE_OTHER): Payer: Medicare Other | Admitting: Cardiothoracic Surgery

## 2019-11-09 ENCOUNTER — Ambulatory Visit
Admission: RE | Admit: 2019-11-09 | Discharge: 2019-11-09 | Disposition: A | Discharge status: Home / Self Care | Payer: Medicare Other | Source: Ambulatory Visit | Attending: Cardiothoracic Surgery | Admitting: Cardiothoracic Surgery

## 2019-11-09 ENCOUNTER — Other Ambulatory Visit: Payer: Self-pay | Admitting: Cardiothoracic Surgery

## 2019-11-09 ENCOUNTER — Encounter: Payer: Self-pay | Admitting: Cardiothoracic Surgery

## 2019-11-09 VITALS — BP 121/71 | HR 80 | Temp 96.8°F | Resp 20 | Ht 73.0 in | Wt 191.0 lb

## 2019-11-09 DIAGNOSIS — Z9689 Presence of other specified functional implants: Secondary | ICD-10-CM

## 2019-11-09 DIAGNOSIS — I25709 Atherosclerosis of coronary artery bypass graft(s), unspecified, with unspecified angina pectoris: Secondary | ICD-10-CM

## 2019-11-09 DIAGNOSIS — J90 Pleural effusion, not elsewhere classified: Secondary | ICD-10-CM

## 2019-11-09 NOTE — Progress Notes (Signed)
PCP is [name], MD Referring Provider is Rigoberto Noel, MD  Chief Complaint  Patient presents with  . Pleural Effusion    6 week f/u with CXR    HPI: Right Pleurx catheter check.  Being treated for right mesothelioma with chemotherapy.  Drainage has dropped to nail for the past 10 days.  Chest x-ray today is clear without evidence of reaccumulation.  We will set up for Pleurx catheter removal as outpatient at [facility] short stay on Tuesday November 24 after the patient gets blood drawn at the cancer center.  Patient will hold his Xarelto for 2 days prior to the procedure.   Past Medical History:  Diagnosis Date  . Anxiety   . Aortic stenosis    mild AS by 07/20/13 echo ([facility])  . Arthritis    "all over"  . Coronary artery disease    NSTEMI 06/2010, CABG x3=> [place]->LAD, SVG->OM1, SVG->PDA, DES LCx 10/2011, DES LAD 12/2011  . Depression   . GERD (gastroesophageal reflux disease)   . H/O hiatal hernia   . Hearing aid worn    B/L  . High cholesterol   . History of blood transfusion reaction 03-06-13   "he almost died; he has to get irradiated blood next time"  . Hypertension   . Hypothyroidism   . Ischemic cardiomyopathy   . ITP (idiopathic thrombocytopenic purpura)    Dr. Gaynelle Arabian, on [place]  . Myocardial infarction ([place]) 2010/03/06  . Pneumonia 1940's X 1; 03/06/14 X 1  . Rheumatoid arthritis ([place])   . Shortness of breath    with exertion, has not been very active  . Sleep apnea   . Wears glasses   . Wears partial dentures     Past Surgical History:  Procedure Laterality Date  . APPENDECTOMY    . BACK SURGERY    . CATARACT EXTRACTION, BILATERAL    . CHEST TUBE INSERTION Right 05/20/2019   Procedure: INSERTION PLEURAL DRAINAGE CATHETER;  Surgeon: Ivin Poot, MD;  Location: [place];  Service: Thoracic;  Laterality: Right;  . COLONOSCOPY    . CORONARY ANGIOPLASTY WITH STENT PLACEMENT     DES Lcx 10/2011, DES LAD 12/2011  . CORONARY ARTERY BYPASS GRAFT   03/06/10   "CABG X3"  . CORONARY STENT INTERVENTION N/A 05/26/2019   Procedure: CORONARY STENT INTERVENTION;  Surgeon: Nelva Bush, MD;  Location: [place] [facility];  Service: Cardiovascular;  Laterality: N/A;  . GASTROC RECESSION EXTREMITY Right 07/06/2015   Pasty Spillers (orthopedics- [place], [place])  . HERNIA REPAIR    . HIATAL HERNIA REPAIR    . IR IMAGING GUIDED PORT INSERTION  06/29/2019  . KNEE ARTHROSCOPY Left 06/22/2014   w/I&D  . KNEE ARTHROSCOPY Left 06/22/2014   Procedure: IRRIGATION AND DEBRIDEMENT WITH CHRONDROPLASTY;  Surgeon: Hessie Dibble, MD;  Location: [place];  Service: Orthopedics;  Laterality: Left;  . LEFT HEART CATH AND CORS/GRAFTS ANGIOGRAPHY N/A 05/26/2019   Procedure: LEFT HEART CATH AND CORS/GRAFTS ANGIOGRAPHY;  Surgeon: Nelva Bush, MD;  Location: [place] [facility];  Service: Cardiovascular;  Laterality: N/A;  . LUMBAR DISC SURGERY  1960's?  . MULTIPLE TOOTH EXTRACTIONS    . PLEURAL BIOPSY Right 05/20/2019   Procedure: PLEURAL BIOPSY;  Surgeon: Ivin Poot, MD;  Location: [place];  Service: Thoracic;  Laterality: Right;  . SHOULDER ARTHROSCOPY Right 04/09/2017   Procedure: ARTHROSCOPY SHOULDER;  Surgeon: Melrose Nakayama, MD;  Location: [place];  Service: Orthopedics;  Laterality: Right;  .  STERNAL CLOSURE     "wires from OHS taken out; plate put in" (03/24/3535)  . SYNOVECTOMY Left 08/17/2014   Procedure: SYNOVECTOMY LEFT KNEE;  Surgeon: Hessie Dibble, MD;  Location: [place];  Service: Orthopedics;  Laterality: Left;  . TOTAL ANKLE ARTHROPLASTY Right 07/06/2015   Pasty Spillers ([facility]opedics- [facility])  . TOTAL HIP ARTHROPLASTY Right 06/29/2018   Procedure: RIGHT TOTAL HIP ARTHROPLASTY ANTERIOR APPROACH;  Surgeon: Melrose Nakayama, MD;  [facility];  Service: Orthopedics;  Laterality: Right;  . TOTAL HIP ARTHROPLASTY Left 06/09/2016   Scott Streater Claiborne Billings (orthopedics- [place])  . TOTAL KNEE ARTHROPLASTY Left 11/24/2014   Procedure: TOTAL KNEE ARTHROPLASTY;   Surgeon: Hessie Dibble, MD;  Location: [place];  Service: Orthopedics;  Laterality: Left;  [name] Kitchen VIDEO ASSISTED THORACOSCOPY Right 05/20/2019   Procedure: VIDEO ASSISTED THORACOSCOPY;  Surgeon: Ivin Poot, MD;  Location: [facility] OR;  Service: Thoracic;  Laterality: Right;    Family History  Problem Relation Age of Onset  . Heart disease Other   . Arthritis Other   . Heart disease Mother   . Alzheimer's disease Father   . Rheum arthritis Sister   . Rheum arthritis Brother   . Healthy Son     Social History Social History   Tobacco Use  . Smoking status: Former Smoker    Packs/day: 1.00    Years: 15.00    Pack years: 15.00    Types: Cigarettes    Quit date: 12/22/1966    Years since quitting: 52.9  . Smokeless tobacco: Former Systems developer    Types: Chew  . Tobacco comment: "quit smoking ~ late ~ 60's; quit chewing in the 1970's"  Substance Use Topics  . Alcohol use: No  . Drug use: No    Current Outpatient Medications  Medication Sig Dispense Refill  . albuterol (VENTOLIN HFA) 108 (90 Base) MCG/ACT inhaler Inhale 1 puff into the lungs 2 (two) times daily as needed for shortness of breath.    . ALPRAZolam (XANAX) 0.25 MG tablet Take 1 tablet (0.25 mg total) by mouth 2 (two) times daily as needed for anxiety. 20 tablet 0  . aspirin EC 81 MG tablet Take 81 mg by mouth daily.    . benzonatate (TESSALON) 100 MG capsule Take 1 capsule (100 mg total) by mouth every 8 (eight) hours. 21 capsule 0  . chlorproMAZINE (THORAZINE) 25 MG tablet TAKE 1 TABLET BY MOUTH THREE TIMES DAILY 30 tablet 0  . dexamethasone (DECADRON) 4 MG tablet 4 mg p.o. twice daily the day before, day of and day after chemotherapy every 3 weeks 40 tablet 1  . Fluticasone-Salmeterol (ADVAIR) 100-50 MCG/DOSE AEPB Inhale 1 puff into the lungs 2 (two) times daily.    . folic acid (FOLVITE) 1 MG tablet Take 1 tablet (1 mg total) by mouth daily. 30 tablet 4  . guaiFENesin (MUCINEX) 600 MG 12 hr tablet Take 1 tablet (600 mg total)  by mouth 2 (two) times daily. 60 tablet 1  . ipratropium-albuterol (DUONEB) 0.5-2.5 (3) MG/3ML SOLN Take 3 mLs by nebulization every 6 (six) hours as needed. J44.9 (Patient taking differently: Take 3 mLs by nebulization every 6 (six) hours as needed (shortness of breath). J44.9) 360 mL 3  . levothyroxine (SYNTHROID, LEVOTHROID) 200 MCG tablet Take 200 mcg by mouth See admin instructions. Take 1 tablet Monday through Friday - None on Saturday or Sunday.    . lidocaine-prilocaine (EMLA) cream Apply 1 application topically as needed. 30 g 0  .  magic mouthwash SOLN Take 5 mLs by mouth 4 (four) times daily. Components-Benadryl 12.5 mg/37ml , hydrocortisone 60 mg, nystatin 30 ml 240 mL 1  . mirtazapine (REMERON) 15 MG tablet Take 15 mg by mouth at bedtime.    [name] Kitchen omeprazole (PRILOSEC) 40 MG capsule Take 40 mg by mouth at bedtime.     . prochlorperazine (COMPAZINE) 10 MG tablet Take 1 tablet (10 mg total) by mouth every 6 (six) hours as needed for nausea or vomiting. 30 tablet 0  . rivaroxaban (XARELTO) 20 MG TABS tablet Take 20 mg by mouth daily with supper.    . Vitamin D, Ergocalciferol, (DRISDOL) 1.25 MG (50000 UT) CAPS capsule Take 50,000 Units by mouth every 7 (seven) days.     No current facility-administered medications for this visit.     Allergies  Allergen Reactions  . Lipitor [Atorvastatin] Other (See Comments)    muscle spasms cramps  . Methylprednisolone Other (See Comments)    cramps cramps  . Morphine And Related     Hallucinations at times  . Other     If patient is to receive blood - blood must be treated witgh radiation because his body will not accept a normal infusion   . Sulfa Antibiotics Itching  . Ciprofloxacin Rash  . Doxycycline Rash    Doxycycline caused itching redness on scalp and head Doxycycline caused itching redness on scalp and head    Review of Systems  Uses a wheelchair but can walk minimally Weight has been stable at 191 pounds Patient has received a flu  shot Patient denies fever shortness of breath or productive cough No drainage around the catheter site  BP 121/71   Pulse 80   Temp (!) 96.8 F (36 C) (Skin)   Resp 20   Ht 6\' 1"  (1.854 m)   Wt 191 lb (86.6 kg)   SpO2 93% Comment: RA  BMI 25.20 kg/m  Physical Exam      Exam    General- alert and comfortable    Neck- no JVD, no cervical adenopathy palpable, no carotid bruit   Lungs- clear without rales, wheezes.  Pleurx catheter gauze dry intact   Cor- regular rate and rhythm, no murmur , gallop   Abdomen- soft, non-tender   Extremities - warm, non-tender, minimal edema   Neuro- oriented, appropriate, no focal weakness   Diagnostic Tests: Chest x-ray image personally reviewed showing minimal blunting of the right costophrenic angle, no significant effusion  Impression: Pleurx drainage has decreased down to nil.  Catheter will be removed as an outpatient at [facility] short stay on November 24.  Procedure discussed with patient and wife.  Plan: Return for catheter removal at [facility] November 24.   Len Childs, MD [facility] [phone]

## 2019-11-14 ENCOUNTER — Other Ambulatory Visit: Payer: Self-pay | Admitting: *Deleted

## 2019-11-14 DIAGNOSIS — C450 Mesothelioma of pleura: Secondary | ICD-10-CM

## 2019-11-15 ENCOUNTER — Inpatient Hospital Stay: Payer: Medicare Other

## 2019-11-15 ENCOUNTER — Other Ambulatory Visit: Payer: Self-pay

## 2019-11-15 ENCOUNTER — Encounter (HOSPITAL_COMMUNITY)
Admission: RE | Disposition: A | Discharge status: Home / Self Care | Payer: Self-pay | Source: Home / Self Care | Attending: Cardiothoracic Surgery

## 2019-11-15 ENCOUNTER — Ambulatory Visit (HOSPITAL_COMMUNITY)
Admission: RE | Admit: 2019-11-15 | Discharge: 2019-11-15 | Disposition: A | Discharge status: Home / Self Care | Payer: Medicare Other | Attending: Cardiothoracic Surgery | Admitting: Cardiothoracic Surgery

## 2019-11-15 DIAGNOSIS — J910 Malignant pleural effusion: Secondary | ICD-10-CM | POA: Diagnosis not present

## 2019-11-15 DIAGNOSIS — F419 Anxiety disorder, unspecified: Secondary | ICD-10-CM | POA: Insufficient documentation

## 2019-11-15 DIAGNOSIS — K219 Gastro-esophageal reflux disease without esophagitis: Secondary | ICD-10-CM | POA: Diagnosis not present

## 2019-11-15 DIAGNOSIS — Z955 Presence of coronary angioplasty implant and graft: Secondary | ICD-10-CM | POA: Diagnosis not present

## 2019-11-15 DIAGNOSIS — Z87891 Personal history of nicotine dependence: Secondary | ICD-10-CM | POA: Insufficient documentation

## 2019-11-15 DIAGNOSIS — Z4682 Encounter for fitting and adjustment of non-vascular catheter: Secondary | ICD-10-CM | POA: Diagnosis not present

## 2019-11-15 DIAGNOSIS — F329 Major depressive disorder, single episode, unspecified: Secondary | ICD-10-CM | POA: Insufficient documentation

## 2019-11-15 DIAGNOSIS — I255 Ischemic cardiomyopathy: Secondary | ICD-10-CM | POA: Diagnosis not present

## 2019-11-15 DIAGNOSIS — Z951 Presence of aortocoronary bypass graft: Secondary | ICD-10-CM | POA: Insufficient documentation

## 2019-11-15 DIAGNOSIS — D693 Immune thrombocytopenic purpura: Secondary | ICD-10-CM | POA: Diagnosis not present

## 2019-11-15 DIAGNOSIS — C459 Mesothelioma, unspecified: Secondary | ICD-10-CM | POA: Diagnosis not present

## 2019-11-15 DIAGNOSIS — Z79899 Other long term (current) drug therapy: Secondary | ICD-10-CM | POA: Diagnosis not present

## 2019-11-15 DIAGNOSIS — I2510 Atherosclerotic heart disease of native coronary artery without angina pectoris: Secondary | ICD-10-CM | POA: Diagnosis not present

## 2019-11-15 DIAGNOSIS — J90 Pleural effusion, not elsewhere classified: Secondary | ICD-10-CM

## 2019-11-15 DIAGNOSIS — G4730 Sleep apnea, unspecified: Secondary | ICD-10-CM | POA: Insufficient documentation

## 2019-11-15 DIAGNOSIS — I10 Essential (primary) hypertension: Secondary | ICD-10-CM | POA: Diagnosis not present

## 2019-11-15 DIAGNOSIS — C450 Mesothelioma of pleura: Secondary | ICD-10-CM | POA: Diagnosis not present

## 2019-11-15 DIAGNOSIS — Z96652 Presence of left artificial knee joint: Secondary | ICD-10-CM | POA: Diagnosis not present

## 2019-11-15 DIAGNOSIS — Z96643 Presence of artificial hip joint, bilateral: Secondary | ICD-10-CM | POA: Insufficient documentation

## 2019-11-15 DIAGNOSIS — E039 Hypothyroidism, unspecified: Secondary | ICD-10-CM | POA: Insufficient documentation

## 2019-11-15 DIAGNOSIS — I252 Old myocardial infarction: Secondary | ICD-10-CM | POA: Diagnosis not present

## 2019-11-15 DIAGNOSIS — Z79890 Hormone replacement therapy (postmenopausal): Secondary | ICD-10-CM | POA: Insufficient documentation

## 2019-11-15 DIAGNOSIS — Z7901 Long term (current) use of anticoagulants: Secondary | ICD-10-CM | POA: Diagnosis not present

## 2019-11-15 HISTORY — PX: REMOVAL OF PLEURAL DRAINAGE CATHETER: SHX5080

## 2019-11-15 LAB — CMP (CANCER CENTER ONLY)
ALT: 12 U/L (ref 0–44)
AST: 14 U/L — ABNORMAL LOW (ref 15–41)
Albumin: 3 g/dL — ABNORMAL LOW (ref 3.5–5.0)
Alkaline Phosphatase: 70 U/L (ref 38–126)
Anion gap: 11 (ref 5–15)
BUN: 13 mg/dL (ref 8–23)
CO2: 28 mmol/L (ref 22–32)
Calcium: 9.3 mg/dL (ref 8.9–10.3)
Chloride: 103 mmol/L (ref 98–111)
Creatinine: 1.03 mg/dL (ref 0.61–1.24)
GFR, Est AFR Am: >60 mL/min (ref >60)
GFR, Estimated: >60 mL/min (ref >60)
Glucose, Bld: 118 mg/dL — ABNORMAL HIGH (ref 70–99)
Potassium: 4.4 mmol/L (ref 3.5–5.1)
Sodium: 142 mmol/L (ref 135–145)
Total Bilirubin: 0.3 mg/dL (ref 0.3–1.2)
Total Protein: 7.1 g/dL (ref 6.5–8.1)

## 2019-11-15 LAB — CBC WITH DIFFERENTIAL (CANCER CENTER ONLY)
Abs Immature Granulocytes: 0.02 10*3/uL (ref 0.00–0.07)
Basophils Absolute: 0 10*3/uL (ref 0.0–0.1)
Basophils Relative: 0 %
Eosinophils Absolute: 0.3 10*3/uL (ref 0.0–0.5)
Eosinophils Relative: 7 %
HCT: 28.4 % — ABNORMAL LOW (ref 39.0–52.0)
Hemoglobin: 9 g/dL — ABNORMAL LOW (ref 13.0–17.0)
Immature Granulocytes: 0 %
Lymphocytes Relative: 28 %
Lymphs Abs: 1.3 10*3/uL (ref 0.7–4.0)
MCH: 29.8 pg (ref 26.0–34.0)
MCHC: 31.7 g/dL (ref 30.0–36.0)
MCV: 94 fL (ref 80.0–100.0)
Monocytes Absolute: 0.7 10*3/uL (ref 0.1–1.0)
Monocytes Relative: 15 %
Neutro Abs: 2.4 10*3/uL (ref 1.7–7.7)
Neutrophils Relative %: 50 %
Platelet Count: 67 10*3/uL — ABNORMAL LOW (ref 150–400)
RBC: 3.02 MIL/uL — ABNORMAL LOW (ref 4.22–5.81)
RDW: 17.4 % — ABNORMAL HIGH (ref 11.5–15.5)
WBC Count: 4.7 10*3/uL (ref 4.0–10.5)
nRBC: 0 % (ref 0.0–0.2)

## 2019-11-15 SURGERY — REMOVAL, CLOSED DRAINAGE CATHETER SYSTEM, PLEURAL
Anesthesia: Monitor Anesthesia Care | Laterality: Right

## 2019-11-15 MED ORDER — LIDOCAINE HCL 1 % IJ SOLN
INTRAMUSCULAR | Status: DC | PRN
  Administered 2019-11-15: 10 mL

## 2019-11-15 MED ORDER — LIDOCAINE HCL (PF) 1 % IJ SOLN
INTRAMUSCULAR | Status: AC
  Filled 2019-11-15: qty 30

## 2019-11-15 NOTE — Progress Notes (Signed)
Verbal post plurex removal instructions provided by Jadene Pierini, PA. Patient discharged via wheelchair.

## 2019-11-15 NOTE — Progress Notes (Signed)
      [address]       [place] [address]             [phone]    History: right pleurx placed for malignant  effusion. No recent drainage on mutiple attempts  Exam : WD elderly male in no distress  Lungs: dim in right base  Cor: loud , harsh aortic systolic murmur  Pleurx site: no signs of infection       Brief procedure note:   Right Pleurx removal :     Drained catheter: less than 25 cc drained  Betadine prep  Anesthesia- 1% local lidocaine, 10 cc  Routine technique, removed intact   Patient tolerated procedure  well  2 3-0 nylon sutures placed , plan for removal in office in one week     John Giovanni, PA-C

## 2019-11-15 NOTE — H&P (Signed)
PCP is [name], MD Referring Provider is No ref. provider found  No chief complaint on file.   HPI: Right Pleurx catheter check.  Being treated for right mesothelioma with chemotherapy.  Drainage has dropped to nail for the past 10 days.  Chest x-ray today is clear without evidence of reaccumulation.  We will set up for Pleurx catheter removal as outpatient at [facility] short stay on Tuesday November 24 after the patient gets blood drawn at the cancer center.  Patient will hold his Xarelto for 2 days prior to the procedure.   Past Medical History:  Diagnosis Date  . Anxiety   . Aortic stenosis    mild AS by 07/20/13 echo ([facility])  . Arthritis    "all over"  . Coronary artery disease    NSTEMI 06/2010, CABG x3=> [place]->LAD, SVG->OM1, SVG->PDA, DES LCx 10/2011, DES LAD 12/2011  . Depression   . GERD (gastroesophageal reflux disease)   . H/O hiatal hernia   . Hearing aid worn    B/L  . High cholesterol   . History of blood transfusion reaction 03-17-2013   "he almost died; he has to get irradiated blood next time"  . Hypertension   . Hypothyroidism   . Ischemic cardiomyopathy   . ITP (idiopathic thrombocytopenic purpura)    Dr. Gaynelle Arabian, on [place]  . Myocardial infarction ([place]) 03-17-10  . Pneumonia 1940's X 1; 03-17-2014 X 1  . Rheumatoid arthritis ([place])   . Shortness of breath    with exertion, has not been very active  . Sleep apnea   . Wears glasses   . Wears partial dentures     Past Surgical History:  Procedure Laterality Date  . APPENDECTOMY    . BACK SURGERY    . CATARACT EXTRACTION, BILATERAL    . CHEST TUBE INSERTION Right 05/20/2019   Procedure: INSERTION PLEURAL DRAINAGE CATHETER;  Surgeon: Ivin Poot, MD;  Location: [place];  Service: Thoracic;  Laterality: Right;  . COLONOSCOPY    . CORONARY ANGIOPLASTY WITH STENT PLACEMENT     DES Lcx 10/2011, DES LAD 12/2011  . CORONARY ARTERY BYPASS GRAFT  2010-03-17   "CABG X3"  . CORONARY STENT INTERVENTION N/A  05/26/2019   Procedure: CORONARY STENT INTERVENTION;  Surgeon: Nelva Bush, MD;  Location: [place] [facility];  Service: Cardiovascular;  Laterality: N/A;  . GASTROC RECESSION EXTREMITY Right 07/06/2015   Pasty Spillers (orthopedics- [place], [place])  . HERNIA REPAIR    . HIATAL HERNIA REPAIR    . IR IMAGING GUIDED PORT INSERTION  06/29/2019  . KNEE ARTHROSCOPY Left 06/22/2014   w/I&D  . KNEE ARTHROSCOPY Left 06/22/2014   Procedure: IRRIGATION AND DEBRIDEMENT WITH CHRONDROPLASTY;  Surgeon: Hessie Dibble, MD;  Location: [place];  Service: Orthopedics;  Laterality: Left;  . LEFT HEART CATH AND CORS/GRAFTS ANGIOGRAPHY N/A 05/26/2019   Procedure: LEFT HEART CATH AND CORS/GRAFTS ANGIOGRAPHY;  Surgeon: Nelva Bush, MD;  Location: [place] [facility];  Service: Cardiovascular;  Laterality: N/A;  . LUMBAR DISC SURGERY  1960's?  . MULTIPLE TOOTH EXTRACTIONS    . PLEURAL BIOPSY Right 05/20/2019   Procedure: PLEURAL BIOPSY;  Surgeon: Ivin Poot, MD;  Location: [place];  Service: Thoracic;  Laterality: Right;  . SHOULDER ARTHROSCOPY Right 04/09/2017   Procedure: ARTHROSCOPY SHOULDER;  Surgeon: Melrose Nakayama, MD;  Location: [place];  Service: Orthopedics;  Laterality: Right;  . STERNAL CLOSURE     "wires from OHS taken out; plate put in" (  06/22/2014)  . SYNOVECTOMY Left 08/17/2014   Procedure: SYNOVECTOMY LEFT KNEE;  Surgeon: Hessie Dibble, MD;  Location: [place];  Service: Orthopedics;  Laterality: Left;  . TOTAL ANKLE ARTHROPLASTY Right 07/06/2015   Pasty Spillers (orthopedics- [facility])  . TOTAL HIP ARTHROPLASTY Right 06/29/2018   Procedure: RIGHT TOTAL HIP ARTHROPLASTY ANTERIOR APPROACH;  Surgeon: Melrose Nakayama, MD;  [facility];  Service: Orthopedics;  Laterality: Right;  . TOTAL HIP ARTHROPLASTY Left 06/09/2016   Scott Streater Claiborne Billings (orthopedics- [place])  . TOTAL KNEE ARTHROPLASTY Left 11/24/2014   Procedure: TOTAL KNEE ARTHROPLASTY;  Surgeon: Hessie Dibble, MD;  Location: [place];  Service:  Orthopedics;  Laterality: Left;  [name] Kitchen VIDEO ASSISTED THORACOSCOPY Right 05/20/2019   Procedure: VIDEO ASSISTED THORACOSCOPY;  Surgeon: Ivin Poot, MD;  Location: [facility];  Service: Thoracic;  Laterality: Right;    Family History  Problem Relation Age of Onset  . Heart disease Other   . Arthritis Other   . Heart disease Mother   . Alzheimer's disease Father   . Rheum arthritis Sister   . Rheum arthritis Brother   . Healthy Son     Social History Social History   Tobacco Use  . Smoking status: Former Smoker    Packs/day: 1.00    Years: 15.00    Pack years: 15.00    Types: Cigarettes    Quit date: 12/22/1966    Years since quitting: 52.9  . Smokeless tobacco: Former Systems developer    Types: Chew  . Tobacco comment: "quit smoking ~ late ~ 60's; quit chewing in the 1970's"  Substance Use Topics  . Alcohol use: No  . Drug use: No    Current Facility-Administered Medications  Medication Dose Route Frequency Provider Last Rate Last Dose  . lidocaine (PF) (XYLOCAINE) 1 % injection           . lidocaine (XYLOCAINE) 1 % (with pres) injection    PRN Jadene Pierini E, PA-C   10 mL at 11/15/19 1209   Current Outpatient Medications  Medication Sig Dispense Refill  . albuterol (VENTOLIN HFA) 108 (90 Base) MCG/ACT inhaler Inhale 1 puff into the lungs 2 (two) times daily as needed for shortness of breath.    . ALPRAZolam (XANAX) 0.25 MG tablet Take 1 tablet (0.25 mg total) by mouth 2 (two) times daily as needed for anxiety. 20 tablet 0  . aspirin EC 81 MG tablet Take 81 mg by mouth daily.    . chlorproMAZINE (THORAZINE) 25 MG tablet TAKE 1 TABLET BY MOUTH THREE TIMES DAILY (Patient taking differently: Take 25 mg by mouth 3 (three) times daily as needed for nausea. Before during and after chemo) 30 tablet 0  . dexamethasone (DECADRON) 4 MG tablet 4 mg p.o. twice daily the day before, day of and day after chemotherapy every 3 weeks (Patient taking differently: Take 16 mg by mouth as needed. 16 mg p.o.  twice daily the day before, day of and day after chemotherapy every 3 weeks) 40 tablet 1  . Fluticasone-Salmeterol (ADVAIR) 100-50 MCG/DOSE AEPB Inhale 1 puff into the lungs daily as needed (shortness of breath).     . folic acid (FOLVITE) 1 MG tablet Take 1 tablet (1 mg total) by mouth daily. 30 tablet 4  . ipratropium-albuterol (DUONEB) 0.5-2.5 (3) MG/3ML SOLN Take 3 mLs by nebulization every 6 (six) hours as needed. J44.9 (Patient taking differently: Take 3 mLs by nebulization every 6 (six) hours as needed (shortness of breath). J44.9) 360  mL 3  . levothyroxine (SYNTHROID, LEVOTHROID) 200 MCG tablet Take 200 mcg by mouth See admin instructions. Take 200 mg tablet Monday through Friday - None on Saturday or Sunday.    . lidocaine-prilocaine (EMLA) cream Apply 1 application topically as needed. (Patient taking differently: Apply 1 application topically as needed (On chemo port). ) 30 g 0  . mirtazapine (REMERON) 15 MG tablet Take 15 mg by mouth daily as needed (sleep).     [name] Kitchen omeprazole (PRILOSEC) 40 MG capsule Take 40 mg by mouth at bedtime.     . prochlorperazine (COMPAZINE) 10 MG tablet Take 1 tablet (10 mg total) by mouth every 6 (six) hours as needed for nausea or vomiting. 30 tablet 0  . rivaroxaban (XARELTO) 20 MG TABS tablet Take 20 mg by mouth daily with supper.    . benzonatate (TESSALON) 100 MG capsule Take 1 capsule (100 mg total) by mouth every 8 (eight) hours. (Patient not taking: Reported on 11/10/2019) 21 capsule 0  . guaiFENesin (MUCINEX) 600 MG 12 hr tablet Take 1 tablet (600 mg total) by mouth 2 (two) times daily. (Patient not taking: Reported on 11/10/2019) 60 tablet 1  . magic mouthwash SOLN Take 5 mLs by mouth 4 (four) times daily. Components-Benadryl 12.5 mg/3ml , hydrocortisone 60 mg, nystatin 30 ml (Patient not taking: Reported on 11/10/2019) 240 mL 1    Allergies  Allergen Reactions  . Lipitor [Atorvastatin] Other (See Comments)    muscle spasms cramps  .  Methylprednisolone Other (See Comments)    cramps cramps  . Morphine And Related     Hallucinations at times  . Other     If patient is to receive blood - blood must be treated witgh radiation because his body will not accept a normal infusion   . Sulfa Antibiotics Itching  . Ciprofloxacin Rash  . Doxycycline Rash    Doxycycline caused itching redness on scalp and head Doxycycline caused itching redness on scalp and head    Review of Systems  Uses a wheelchair but can walk minimally Weight has been stable at 191 pounds Patient has received a flu shot Patient denies fever shortness of breath or productive cough No drainage around the catheter site  BP (!) 151/77   Pulse 80   Temp 98.1 F (36.7 C) (Oral)   Resp 20   SpO2 100%  Physical Exam      Exam    General- alert and comfortable    Neck- no JVD, no cervical adenopathy palpable, no carotid bruit   Lungs- clear without rales, wheezes.  Pleurx catheter gauze dry intact   Cor- regular rate and rhythm, no murmur , gallop   Abdomen- soft, non-tender   Extremities - warm, non-tender, minimal edema   Neuro- oriented, appropriate, no focal weakness   Diagnostic Tests: Chest x-ray image personally reviewed showing minimal blunting of the right costophrenic angle, no significant effusion  Impression: Pleurx drainage has decreased down to nil.  Catheter will be removed as an outpatient at [facility] short stay on November 24.  Procedure discussed with patient and wife.  Plan: Return for catheter removal at [facility] November 24.   Len Childs, MD [facility]    11-24  Pre Procedure note    Jeffrey [name] has been scheduled for Procedure(s): REMOVAL OF PLEURAL DRAINAGE CATHETER (Right) today. The various methods of treatment have been discussed with the patient. After consideration of the risks, benefits and treatment options the  patient has consented to the planned procedure.   The  patient has been seen and labs reviewed. There are no changes in the patient's condition to prevent proceeding with the planned procedure today.  Recent labs:  Lab Results  Component Value Date   WBC 4.7 11/15/2019   HGB 9.0 (L) 11/15/2019   HCT 28.4 (L) 11/15/2019   PLT 67 (L) 11/15/2019   GLUCOSE 118 (H) 11/15/2019   ALT 12 11/15/2019   AST 14 (L) 11/15/2019   NA 142 11/15/2019   K 4.4 11/15/2019   CL 103 11/15/2019   CREATININE 1.03 11/15/2019   BUN 13 11/15/2019   CO2 28 11/15/2019   TSH 1.345 08/06/2019   INR 1.0 06/29/2019    Ivin Poot III, MD 11/15/2019 1:38 PM      [phone]

## 2019-11-16 ENCOUNTER — Encounter (HOSPITAL_COMMUNITY): Payer: Self-pay | Admitting: Cardiothoracic Surgery

## 2019-11-21 ENCOUNTER — Other Ambulatory Visit: Payer: Self-pay | Admitting: Medical Oncology

## 2019-11-21 DIAGNOSIS — C450 Mesothelioma of pleura: Secondary | ICD-10-CM

## 2019-11-22 ENCOUNTER — Inpatient Hospital Stay (HOSPITAL_BASED_OUTPATIENT_CLINIC_OR_DEPARTMENT_OTHER): Payer: Medicare Other | Admitting: Internal Medicine

## 2019-11-22 ENCOUNTER — Inpatient Hospital Stay: Payer: Medicare Other | Attending: Internal Medicine

## 2019-11-22 ENCOUNTER — Other Ambulatory Visit: Payer: Self-pay

## 2019-11-22 ENCOUNTER — Inpatient Hospital Stay: Payer: Medicare Other

## 2019-11-22 ENCOUNTER — Encounter: Payer: Self-pay | Admitting: Internal Medicine

## 2019-11-22 VITALS — BP 124/71 | HR 88 | Temp 98.2°F | Resp 18 | Ht 73.0 in | Wt 190.8 lb

## 2019-11-22 DIAGNOSIS — F329 Major depressive disorder, single episode, unspecified: Secondary | ICD-10-CM | POA: Diagnosis not present

## 2019-11-22 DIAGNOSIS — Z79899 Other long term (current) drug therapy: Secondary | ICD-10-CM | POA: Diagnosis not present

## 2019-11-22 DIAGNOSIS — Z7982 Long term (current) use of aspirin: Secondary | ICD-10-CM | POA: Diagnosis not present

## 2019-11-22 DIAGNOSIS — C450 Mesothelioma of pleura: Secondary | ICD-10-CM | POA: Diagnosis not present

## 2019-11-22 DIAGNOSIS — R0602 Shortness of breath: Secondary | ICD-10-CM | POA: Insufficient documentation

## 2019-11-22 DIAGNOSIS — Z86711 Personal history of pulmonary embolism: Secondary | ICD-10-CM | POA: Insufficient documentation

## 2019-11-22 DIAGNOSIS — R531 Weakness: Secondary | ICD-10-CM | POA: Insufficient documentation

## 2019-11-22 DIAGNOSIS — E039 Hypothyroidism, unspecified: Secondary | ICD-10-CM | POA: Diagnosis not present

## 2019-11-22 DIAGNOSIS — I252 Old myocardial infarction: Secondary | ICD-10-CM | POA: Insufficient documentation

## 2019-11-22 DIAGNOSIS — M069 Rheumatoid arthritis, unspecified: Secondary | ICD-10-CM | POA: Insufficient documentation

## 2019-11-22 DIAGNOSIS — Z5111 Encounter for antineoplastic chemotherapy: Secondary | ICD-10-CM | POA: Diagnosis not present

## 2019-11-22 DIAGNOSIS — I350 Nonrheumatic aortic (valve) stenosis: Secondary | ICD-10-CM | POA: Insufficient documentation

## 2019-11-22 DIAGNOSIS — Z7901 Long term (current) use of anticoagulants: Secondary | ICD-10-CM | POA: Insufficient documentation

## 2019-11-22 DIAGNOSIS — K219 Gastro-esophageal reflux disease without esophagitis: Secondary | ICD-10-CM | POA: Insufficient documentation

## 2019-11-22 DIAGNOSIS — I10 Essential (primary) hypertension: Secondary | ICD-10-CM | POA: Insufficient documentation

## 2019-11-22 DIAGNOSIS — G4730 Sleep apnea, unspecified: Secondary | ICD-10-CM | POA: Diagnosis not present

## 2019-11-22 DIAGNOSIS — I255 Ischemic cardiomyopathy: Secondary | ICD-10-CM | POA: Diagnosis not present

## 2019-11-22 DIAGNOSIS — E7800 Pure hypercholesterolemia, unspecified: Secondary | ICD-10-CM | POA: Diagnosis not present

## 2019-11-22 DIAGNOSIS — F419 Anxiety disorder, unspecified: Secondary | ICD-10-CM | POA: Diagnosis not present

## 2019-11-22 DIAGNOSIS — I2510 Atherosclerotic heart disease of native coronary artery without angina pectoris: Secondary | ICD-10-CM | POA: Diagnosis not present

## 2019-11-22 LAB — CBC WITH DIFFERENTIAL (CANCER CENTER ONLY)
Abs Immature Granulocytes: 0.04 10*3/uL (ref 0.00–0.07)
Basophils Absolute: 0 10*3/uL (ref 0.0–0.1)
Basophils Relative: 0 %
Eosinophils Absolute: 0 10*3/uL (ref 0.0–0.5)
Eosinophils Relative: 0 %
HCT: 28.7 % — ABNORMAL LOW (ref 39.0–52.0)
Hemoglobin: 9 g/dL — ABNORMAL LOW (ref 13.0–17.0)
Immature Granulocytes: 1 %
Lymphocytes Relative: 16 %
Lymphs Abs: 0.9 10*3/uL (ref 0.7–4.0)
MCH: 30.1 pg (ref 26.0–34.0)
MCHC: 31.4 g/dL (ref 30.0–36.0)
MCV: 96 fL (ref 80.0–100.0)
Monocytes Absolute: 0.8 10*3/uL (ref 0.1–1.0)
Monocytes Relative: 13 %
Neutro Abs: 4.2 10*3/uL (ref 1.7–7.7)
Neutrophils Relative %: 70 %
Platelet Count: 319 10*3/uL (ref 150–400)
RBC: 2.99 MIL/uL — ABNORMAL LOW (ref 4.22–5.81)
RDW: 18.2 % — ABNORMAL HIGH (ref 11.5–15.5)
WBC Count: 5.9 10*3/uL (ref 4.0–10.5)
nRBC: 0 % (ref 0.0–0.2)

## 2019-11-22 LAB — CMP (CANCER CENTER ONLY)
ALT: 9 U/L (ref 0–44)
AST: 10 U/L — ABNORMAL LOW (ref 15–41)
Albumin: 3 g/dL — ABNORMAL LOW (ref 3.5–5.0)
Alkaline Phosphatase: 57 U/L (ref 38–126)
Anion gap: 15 (ref 5–15)
BUN: 20 mg/dL (ref 8–23)
CO2: 23 mmol/L (ref 22–32)
Calcium: 9.2 mg/dL (ref 8.9–10.3)
Chloride: 103 mmol/L (ref 98–111)
Creatinine: 1.21 mg/dL (ref 0.61–1.24)
GFR, Est AFR Am: >60 mL/min (ref >60)
GFR, Estimated: 55 mL/min — ABNORMAL LOW (ref >60)
Glucose, Bld: 231 mg/dL — ABNORMAL HIGH (ref 70–99)
Potassium: 4.7 mmol/L (ref 3.5–5.1)
Sodium: 141 mmol/L (ref 135–145)
Total Bilirubin: <0.2 mg/dL — ABNORMAL LOW (ref 0.3–1.2)
Total Protein: 7.4 g/dL (ref 6.5–8.1)

## 2019-11-22 MED ORDER — FAMOTIDINE IN NACL 20-0.9 MG/50ML-% IV SOLN
20.0000 mg | Freq: Once | INTRAVENOUS | Status: AC
  Administered 2019-11-22: 20 mg via INTRAVENOUS

## 2019-11-22 MED ORDER — SODIUM CHLORIDE 0.9 % IV SOLN
343.6000 mg | Freq: Once | INTRAVENOUS | Status: AC
  Administered 2019-11-22: 340 mg via INTRAVENOUS
  Filled 2019-11-22: qty 34

## 2019-11-22 MED ORDER — DIPHENHYDRAMINE HCL 50 MG/ML IJ SOLN
INTRAMUSCULAR | Status: AC
  Filled 2019-11-22: qty 1

## 2019-11-22 MED ORDER — FAMOTIDINE IN NACL 20-0.9 MG/50ML-% IV SOLN
INTRAVENOUS | Status: AC
  Filled 2019-11-22: qty 50

## 2019-11-22 MED ORDER — PALONOSETRON HCL INJECTION 0.25 MG/5ML
INTRAVENOUS | Status: AC
  Filled 2019-11-22: qty 5

## 2019-11-22 MED ORDER — SODIUM CHLORIDE 0.9% FLUSH
10.0000 mL | INTRAVENOUS | Status: DC | PRN
  Administered 2019-11-22: 10 mL
  Filled 2019-11-22: qty 10

## 2019-11-22 MED ORDER — SODIUM CHLORIDE 0.9 % IV SOLN
415.0000 mg/m2 | Freq: Once | INTRAVENOUS | Status: AC
  Administered 2019-11-22: 900 mg via INTRAVENOUS
  Filled 2019-11-22: qty 20

## 2019-11-22 MED ORDER — SODIUM CHLORIDE 0.9 % IV SOLN
Freq: Once | INTRAVENOUS | Status: AC
  Administered 2019-11-22: 11:00:00 via INTRAVENOUS
  Filled 2019-11-22: qty 5

## 2019-11-22 MED ORDER — SODIUM CHLORIDE 0.9 % IV SOLN
Freq: Once | INTRAVENOUS | Status: AC
  Administered 2019-11-22: 11:00:00 via INTRAVENOUS
  Filled 2019-11-22: qty 250

## 2019-11-22 MED ORDER — PALONOSETRON HCL INJECTION 0.25 MG/5ML
0.2500 mg | Freq: Once | INTRAVENOUS | Status: AC
  Administered 2019-11-22: 0.25 mg via INTRAVENOUS

## 2019-11-22 MED ORDER — HEPARIN SOD (PORK) LOCK FLUSH 100 UNIT/ML IV SOLN
500.0000 [IU] | Freq: Once | INTRAVENOUS | Status: AC | PRN
  Administered 2019-11-22: 500 [IU]
  Filled 2019-11-22: qty 5

## 2019-11-22 MED ORDER — DIPHENHYDRAMINE HCL 50 MG/ML IJ SOLN
25.0000 mg | Freq: Once | INTRAMUSCULAR | Status: AC
  Administered 2019-11-22: 25 mg via INTRAVENOUS

## 2019-11-22 NOTE — Patient Instructions (Signed)
[place] Discharge Instructions for Patients Receiving Chemotherapy  Today you received the following chemotherapy agents: Alimta, Carboplatin  To help prevent nausea and vomiting after your treatment, we encourage you to take your nausea medication as directed.    If you develop nausea and vomiting that is not controlled by your nausea medication, call the clinic.   BELOW ARE SYMPTOMS THAT SHOULD BE REPORTED IMMEDIATELY:  *FEVER GREATER THAN 100.5 F  *CHILLS WITH OR WITHOUT FEVER  NAUSEA AND VOMITING THAT IS NOT CONTROLLED WITH YOUR NAUSEA MEDICATION  *UNUSUAL SHORTNESS OF BREATH  *UNUSUAL BRUISING OR BLEEDING  TENDERNESS IN MOUTH AND THROAT WITH OR WITHOUT PRESENCE OF ULCERS  *URINARY PROBLEMS  *BOWEL PROBLEMS  UNUSUAL RASH Items with * indicate a potential emergency and should be followed up as soon as possible.  Feel free to call the clinic should you have any questions or concerns. The clinic phone number is [phone].  Please show the [place] at check-in to the Emergency Department and triage nurse.

## 2019-11-22 NOTE — Progress Notes (Signed)
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  PRIOR THERAPY: None.  CURRENT THERAPY: Systemic chemotherapy with carboplatin for AUC of 5 and Alimta 500 mg/M2 every 3 weeks.  Status post 6 cycles.  Starting from cycle #4 he will be treated with carboplatin for AUC of 4 and Alimta 400 mg/M2.  INTERVAL HISTORY: Jeffrey [name] 85 y.o. male returns to the clinic today for follow-up visit.  The patient is feeling fine today with no concerning complaints except for the baseline shortness of breath increased with exertion.  He denied having any chest pain, cough or hemoptysis.  He denied having any recent weight loss or night sweats.  He has no nausea, vomiting, diarrhea or constipation.  He has some burning of his urine but no hematuria.  The patient denied having any headache or visual changes.  He tolerated the last cycle of his treatment fairly well.  He is here today for evaluation before starting cycle #7.  MEDICAL HISTORY: Past Medical History:  Diagnosis Date  . Anxiety   . Aortic stenosis    mild AS by 07/20/13 echo ([facility])  . Arthritis    "all over"  . Coronary artery disease    NSTEMI 06/2010, CABG x3=> [place]->LAD, SVG->OM1, SVG->PDA, DES LCx 10/2011, DES LAD 12/2011  . Depression   . GERD (gastroesophageal reflux disease)   . H/O hiatal hernia   . Hearing aid worn    B/L  . High cholesterol   . History of blood transfusion reaction March 12, 2013   "he almost died; he has to get irradiated blood next time"  . Hypertension   . Hypothyroidism   . Ischemic cardiomyopathy   . ITP (idiopathic thrombocytopenic purpura)    Dr. Gaynelle Arabian, on [place]  . Myocardial  infarction ([place]) 03/12/2010  . Pneumonia 1940's X 1; 03-12-2014 X 1  . Rheumatoid arthritis ([place])   . Shortness of breath    with exertion, has not been very active  . Sleep apnea   . Wears glasses   . Wears partial dentures     ALLERGIES:  is allergic to lipitor [atorvastatin]; methylprednisolone; morphine and related; other; sulfa antibiotics; ciprofloxacin; and doxycycline.  MEDICATIONS:  Current Outpatient Medications  Medication Sig Dispense Refill  . albuterol (VENTOLIN HFA) 108 (90 Base) MCG/ACT inhaler Inhale 1 puff into the lungs 2 (two) times daily as needed for shortness of breath.    . ALPRAZolam (XANAX) 0.25 MG tablet Take 1 tablet (0.25 mg total) by mouth 2 (two) times daily as needed for anxiety. 20 tablet 0  . aspirin EC 81 MG tablet Take 81 mg by mouth daily.    . benzonatate (TESSALON) 100 MG capsule Take 1 capsule (100 mg total) by mouth every 8 (eight) hours. (Patient not taking: Reported on 11/10/2019) 21 capsule 0  . chlorproMAZINE (THORAZINE) 25 MG tablet TAKE 1 TABLET BY MOUTH THREE TIMES DAILY (Patient taking differently: Take 25 mg by mouth 3 (three) times daily as needed for nausea. Before during and after chemo) 30 tablet 0  . dexamethasone (DECADRON) 4 MG tablet 4 mg p.o. twice daily the day before, day of and day after chemotherapy every 3 weeks (Patient taking differently: Take 16 mg by mouth as  needed. 16 mg p.o. twice daily the day before, day of and day after chemotherapy every 3 weeks) 40 tablet 1  . Fluticasone-Salmeterol (ADVAIR) 100-50 MCG/DOSE AEPB Inhale 1 puff into the lungs daily as needed (shortness of breath).     . folic acid (FOLVITE) 1 MG tablet Take 1 tablet (1 mg total) by mouth daily. 30 tablet 4  . guaiFENesin (MUCINEX) 600 MG 12 hr tablet Take 1 tablet (600 mg total) by mouth 2 (two) times daily. (Patient not taking: Reported on 11/10/2019) 60 tablet 1  . ipratropium-albuterol (DUONEB) 0.5-2.5 (3) MG/3ML SOLN Take 3 mLs by nebulization every 6 (six)  hours as needed. J44.9 (Patient taking differently: Take 3 mLs by nebulization every 6 (six) hours as needed (shortness of breath). J44.9) 360 mL 3  . levothyroxine (SYNTHROID, LEVOTHROID) 200 MCG tablet Take 200 mcg by mouth See admin instructions. Take 200 mg tablet Monday through Friday - None on Saturday or Sunday.    . lidocaine-prilocaine (EMLA) cream Apply 1 application topically as needed. (Patient taking differently: Apply 1 application topically as needed (On chemo port). ) 30 g 0  . magic mouthwash SOLN Take 5 mLs by mouth 4 (four) times daily. Components-Benadryl 12.5 mg/39ml , hydrocortisone 60 mg, nystatin 30 ml (Patient not taking: Reported on 11/10/2019) 240 mL 1  . mirtazapine (REMERON) 15 MG tablet Take 15 mg by mouth daily as needed (sleep).     [name] Kitchen omeprazole (PRILOSEC) 40 MG capsule Take 40 mg by mouth at bedtime.     . prochlorperazine (COMPAZINE) 10 MG tablet Take 1 tablet (10 mg total) by mouth every 6 (six) hours as needed for nausea or vomiting. 30 tablet 0  . rivaroxaban (XARELTO) 20 MG TABS tablet Take 20 mg by mouth daily with supper.     No current facility-administered medications for this visit.     SURGICAL HISTORY:  Past Surgical History:  Procedure Laterality Date  . APPENDECTOMY    . BACK SURGERY    . CATARACT EXTRACTION, BILATERAL    . CHEST TUBE INSERTION Right 05/20/2019   Procedure: INSERTION PLEURAL DRAINAGE CATHETER;  Surgeon: Ivin Poot, MD;  Location: [facility];  Service: Thoracic;  Laterality: Right;  . COLONOSCOPY    . CORONARY ANGIOPLASTY WITH STENT PLACEMENT     DES Lcx 10/2011, DES LAD 12/2011  . CORONARY ARTERY BYPASS GRAFT  2011   "CABG X3"  . CORONARY STENT INTERVENTION N/A 05/26/2019   Procedure: CORONARY STENT INTERVENTION;  Surgeon: Nelva Bush, MD;  Location: [facility];  Service: Cardiovascular;  Laterality: N/A;  . GASTROC RECESSION EXTREMITY Right 07/06/2015   Pasty Spillers (orthopedics- [place], [place])  . HERNIA REPAIR    .  HIATAL HERNIA REPAIR    . IR IMAGING GUIDED PORT INSERTION  06/29/2019  . KNEE ARTHROSCOPY Left 06/22/2014   w/I&D  . KNEE ARTHROSCOPY Left 06/22/2014   Procedure: IRRIGATION AND DEBRIDEMENT WITH CHRONDROPLASTY;  Surgeon: Hessie Dibble, MD;  Location: [facility];  Service: Orthopedics;  Laterality: Left;  . LEFT HEART CATH AND CORS/GRAFTS ANGIOGRAPHY N/A 05/26/2019   Procedure: LEFT HEART CATH AND CORS/GRAFTS ANGIOGRAPHY;  Surgeon: Nelva Bush, MD;  Location: [facility];  Service: Cardiovascular;  Laterality: N/A;  . LUMBAR DISC SURGERY  1960's?  . MULTIPLE TOOTH EXTRACTIONS    . PLEURAL BIOPSY Right 05/20/2019   Procedure: PLEURAL BIOPSY;  Surgeon: Ivin Poot, MD;  Location: [facility];  Service: Thoracic;  Laterality: Right;  . REMOVAL OF  PLEURAL DRAINAGE CATHETER Right 11/15/2019   Procedure: REMOVAL OF PLEURAL DRAINAGE CATHETER;  Surgeon: Ivin Poot, MD;  Location: [facility];  Service: Thoracic;  Laterality: Right;  . SHOULDER ARTHROSCOPY Right 04/09/2017   Procedure: ARTHROSCOPY SHOULDER;  Surgeon: Melrose Nakayama, MD;  Location: [facility];  Service: Orthopedics;  Laterality: Right;  . STERNAL CLOSURE     "wires from OHS taken out; plate put in" (12/25/4313)  . SYNOVECTOMY Left 08/17/2014   Procedure: SYNOVECTOMY LEFT KNEE;  Surgeon: Hessie Dibble, MD;  Location: [facility];  Service: Orthopedics;  Laterality: Left;  . TOTAL ANKLE ARTHROPLASTY Right 07/06/2015   Pasty Spillers (orthopedics- [facility])  . TOTAL HIP ARTHROPLASTY Right 06/29/2018   Procedure: RIGHT TOTAL HIP ARTHROPLASTY ANTERIOR APPROACH;  Surgeon: Melrose Nakayama, MD;  Location: [facility];  Service: Orthopedics;  Laterality: Right;  . TOTAL HIP ARTHROPLASTY Left 06/09/2016   Scott Streater Claiborne Billings (orthopedics- [place] )  . TOTAL KNEE ARTHROPLASTY Left 11/24/2014   Procedure: TOTAL KNEE ARTHROPLASTY;  Surgeon: Hessie Dibble, MD;  Location: [facility];  Service: Orthopedics;  Laterality: Left;  [name] Kitchen VIDEO ASSISTED THORACOSCOPY Right  05/20/2019   Procedure: VIDEO ASSISTED THORACOSCOPY;  Surgeon: Prescott Gum, Collier Salina, MD;  Location: [facility];  Service: Thoracic;  Laterality: Right;    REVIEW OF SYSTEMS:  A comprehensive review of systems was negative except for: Constitutional: positive for fatigue Respiratory: positive for dyspnea on exertion Genitourinary: positive for dysuria   PHYSICAL EXAMINATION: General appearance: alert, cooperative, fatigued and no distress Head: Normocephalic, without obvious abnormality, atraumatic Neck: no adenopathy, no JVD, supple, symmetrical, trachea midline and thyroid not enlarged, symmetric, no tenderness/mass/nodules Lymph nodes: Cervical, supraclavicular, and axillary nodes normal. Resp: clear to auscultation bilaterally Back: symmetric, no curvature. ROM normal. No CVA tenderness. Cardio: systolic murmur: systolic ejection 3/6, harsh at 2nd left intercostal space GI: soft, non-tender; bowel sounds normal; no masses,  no organomegaly Extremities: extremities normal, atraumatic, no cyanosis or edema  ECOG PERFORMANCE STATUS: 1 - Symptomatic but completely ambulatory  Blood pressure 124/71, pulse 88, temperature 98.2 F (36.8 C), temperature source Oral, resp. rate 18, height 6\' 1"  (1.854 m), weight 190 lb 12.8 oz (86.5 kg), SpO2 97 %.  LABORATORY DATA: Lab Results  Component Value Date   WBC 5.9 11/22/2019   HGB 9.0 (L) 11/22/2019   HCT 28.7 (L) 11/22/2019   MCV 96.0 11/22/2019   PLT 319 11/22/2019      Chemistry      Component Value Date/Time   NA 142 11/15/2019 1100   K 4.4 11/15/2019 1100   CL 103 11/15/2019 1100   CO2 28 11/15/2019 1100   BUN 13 11/15/2019 1100   CREATININE 1.03 11/15/2019 1100   CREATININE 1.79 (H) 10/12/2014 1136      Component Value Date/Time   CALCIUM 9.3 11/15/2019 1100   ALKPHOS 70 11/15/2019 1100   AST 14 (L) 11/15/2019 1100   ALT 12 11/15/2019 1100   BILITOT 0.3 11/15/2019 1100       RADIOGRAPHIC STUDIES: Dg Chest 2 View  Result  Date: 11/09/2019 CLINICAL DATA:  Status post VATS with pleural drain in place EXAM: CHEST - 2 VIEW COMPARISON:  09/21/2019 FINDINGS: Cardiac shadow is mildly prominent. Postsurgical changes are again seen. Right chest wall port is again noted and stable. Mild blunting of the right costophrenic angle is seen with PleurX catheter in place. No significant pleural effusion is noted at this time. No acute bony abnormality is seen. IMPRESSION: Right pleural catheter in place.  Chronic blunting of the right costophrenic angle is noted without sizable effusion. Electronically Signed   By: Inez Catalina M.D.   On: 11/09/2019 13:28    ASSESSMENT AND PLAN: This is a very pleasant 85 years old white male with malignant pleural mesothelioma, epithelioid type involving the right hemithorax. The patient started systemic chemotherapy with carboplatin for AUC of 5 and Alimta 500 mg/M2 status post 3 cycles.   The patient has been tolerating this treatment well except for increasing fatigue and weakness. He was recently completed treatment with Levaquin for suspicious pneumonia. I recommended for the patient to resume his systemic chemotherapy with carboplatin and Alimta.  The dose of carboplatin were reduced to AUC of 4 and Alimta to 400 mg/M2 every 3 weeks starting from cycle #4.  He is status post a total of 6 cycles of his chemotherapy. I recommended for the patient to proceed with cycle #7 today as planned. I will see him back for follow-up visit in 3 weeks for evaluation after repeating CT scan of the chest for restaging of his disease.  If the scan showed no concerning findings for disease progression, I may consider the patient for break off chemotherapy at that time. The patient was advised to call immediately if he has any concerning symptoms in the interval. The patient voices understanding of current disease status and treatment options and is in agreement with the current care plan.  All questions were  answered. The patient knows to call the clinic with any problems, questions or concerns. We can certainly see the patient much sooner if necessary.  Disclaimer: This note was dictated with voice recognition software. Similar sounding words can inadvertently be transcribed and may not be corrected upon review.

## 2019-11-23 ENCOUNTER — Telehealth: Payer: Self-pay | Admitting: Internal Medicine

## 2019-11-23 NOTE — Telephone Encounter (Signed)
Scheduled per los. Called and spoke with patient. Confirmed appt 

## 2019-11-25 ENCOUNTER — Other Ambulatory Visit: Payer: Self-pay

## 2019-11-25 ENCOUNTER — Other Ambulatory Visit: Payer: Self-pay | Admitting: Physician Assistant

## 2019-11-25 ENCOUNTER — Telehealth: Payer: Self-pay

## 2019-11-25 ENCOUNTER — Encounter (INDEPENDENT_AMBULATORY_CARE_PROVIDER_SITE_OTHER): Payer: Self-pay

## 2019-11-25 DIAGNOSIS — C450 Mesothelioma of pleura: Secondary | ICD-10-CM

## 2019-11-25 DIAGNOSIS — Z4802 Encounter for removal of sutures: Secondary | ICD-10-CM

## 2019-11-25 MED ORDER — HYDROCODONE-HOMATROPINE 5-1.5 MG/5ML PO SYRP: 5.0000 mL | ORAL_SOLUTION | Freq: Four times a day (QID) | ORAL | 0 refills | Status: DC | PRN

## 2019-11-25 NOTE — Telephone Encounter (Signed)
Mrs. Hardgrove called. Pt has had increased coughing since treatment this week.  He was wheezing this am and he used his nebulizer with relief of wheezing but not cough..  He is producing blood tinged sputum which is not unusual for him. He is afebrile He saw his cardiologist and thoracic surgeon today who both referred them to [place] for cough management.  Discussed with Cassi H, PA.  Cassie sent hycodan rx to pt's pharmacy. I also told Mrs Daywalt to continue the nebulizer treatments. I told Mrs Joung that if this did not help his cough.  They should go to the ED.  Mrs Kushnir verbalized understanding

## 2019-12-09 ENCOUNTER — Encounter (HOSPITAL_COMMUNITY): Payer: Self-pay

## 2019-12-09 ENCOUNTER — Ambulatory Visit (HOSPITAL_COMMUNITY)
Admission: RE | Admit: 2019-12-09 | Discharge: 2019-12-09 | Disposition: A | Discharge status: Home / Self Care | Payer: Medicare Other | Source: Ambulatory Visit | Attending: Internal Medicine | Admitting: Internal Medicine

## 2019-12-09 ENCOUNTER — Other Ambulatory Visit: Payer: Self-pay

## 2019-12-09 DIAGNOSIS — C450 Mesothelioma of pleura: Secondary | ICD-10-CM | POA: Insufficient documentation

## 2019-12-09 MED ORDER — HEPARIN SOD (PORK) LOCK FLUSH 100 UNIT/ML IV SOLN
INTRAVENOUS | Status: AC
  Filled 2019-12-09: qty 5

## 2019-12-09 MED ORDER — HEPARIN SOD (PORK) LOCK FLUSH 100 UNIT/ML IV SOLN
500.0000 [IU] | Freq: Once | INTRAVENOUS | Status: AC
  Administered 2019-12-09: 500 [IU] via INTRAVENOUS

## 2019-12-09 MED ORDER — IOHEXOL 300 MG/ML  SOLN
75.0000 mL | Freq: Once | INTRAMUSCULAR | Status: AC | PRN
  Administered 2019-12-09: 75 mL via INTRAVENOUS

## 2019-12-09 MED ORDER — SODIUM CHLORIDE (PF) 0.9 % IJ SOLN
INTRAMUSCULAR | Status: AC
  Filled 2019-12-09: qty 50

## 2019-12-13 ENCOUNTER — Other Ambulatory Visit: Payer: Self-pay | Admitting: Medical Oncology

## 2019-12-13 DIAGNOSIS — C450 Mesothelioma of pleura: Secondary | ICD-10-CM

## 2019-12-14 ENCOUNTER — Telehealth: Payer: Self-pay | Admitting: Internal Medicine

## 2019-12-14 ENCOUNTER — Other Ambulatory Visit: Payer: Self-pay

## 2019-12-14 ENCOUNTER — Encounter: Payer: Self-pay | Admitting: Internal Medicine

## 2019-12-14 ENCOUNTER — Inpatient Hospital Stay (HOSPITAL_BASED_OUTPATIENT_CLINIC_OR_DEPARTMENT_OTHER): Payer: Medicare Other | Admitting: Internal Medicine

## 2019-12-14 ENCOUNTER — Inpatient Hospital Stay: Payer: Medicare Other

## 2019-12-14 VITALS — BP 118/79 | HR 91 | Temp 97.8°F | Resp 18 | Ht 73.0 in | Wt 188.5 lb

## 2019-12-14 DIAGNOSIS — C3490 Malignant neoplasm of unspecified part of unspecified bronchus or lung: Secondary | ICD-10-CM | POA: Diagnosis not present

## 2019-12-14 DIAGNOSIS — E039 Hypothyroidism, unspecified: Secondary | ICD-10-CM

## 2019-12-14 DIAGNOSIS — I2693 Single subsegmental pulmonary embolism without acute cor pulmonale: Secondary | ICD-10-CM | POA: Diagnosis not present

## 2019-12-14 DIAGNOSIS — C450 Mesothelioma of pleura: Secondary | ICD-10-CM

## 2019-12-14 DIAGNOSIS — Z7189 Other specified counseling: Secondary | ICD-10-CM

## 2019-12-14 LAB — CMP (CANCER CENTER ONLY)
ALT: 7 U/L (ref 0–44)
AST: 11 U/L — ABNORMAL LOW (ref 15–41)
Albumin: 2.9 g/dL — ABNORMAL LOW (ref 3.5–5.0)
Alkaline Phosphatase: 65 U/L (ref 38–126)
Anion gap: 12 (ref 5–15)
BUN: 22 mg/dL (ref 8–23)
CO2: 25 mmol/L (ref 22–32)
Calcium: 8.9 mg/dL (ref 8.9–10.3)
Chloride: 105 mmol/L (ref 98–111)
Creatinine: 1.21 mg/dL (ref 0.61–1.24)
GFR, Est AFR Am: >60 mL/min (ref >60)
GFR, Estimated: 55 mL/min — ABNORMAL LOW (ref >60)
Glucose, Bld: 212 mg/dL — ABNORMAL HIGH (ref 70–99)
Potassium: 4.8 mmol/L (ref 3.5–5.1)
Sodium: 142 mmol/L (ref 135–145)
Total Bilirubin: <0.2 mg/dL — ABNORMAL LOW (ref 0.3–1.2)
Total Protein: 7.1 g/dL (ref 6.5–8.1)

## 2019-12-14 LAB — CBC WITH DIFFERENTIAL (CANCER CENTER ONLY)
Abs Immature Granulocytes: 0.04 10*3/uL (ref 0.00–0.07)
Basophils Absolute: 0 10*3/uL (ref 0.0–0.1)
Basophils Relative: 0 %
Eosinophils Absolute: 0 10*3/uL (ref 0.0–0.5)
Eosinophils Relative: 0 %
HCT: 25.9 % — ABNORMAL LOW (ref 39.0–52.0)
Hemoglobin: 8.2 g/dL — ABNORMAL LOW (ref 13.0–17.0)
Immature Granulocytes: 1 %
Lymphocytes Relative: 18 %
Lymphs Abs: 0.8 10*3/uL (ref 0.7–4.0)
MCH: 30.4 pg (ref 26.0–34.0)
MCHC: 31.7 g/dL (ref 30.0–36.0)
MCV: 95.9 fL (ref 80.0–100.0)
Monocytes Absolute: 0.5 10*3/uL (ref 0.1–1.0)
Monocytes Relative: 11 %
Neutro Abs: 3 10*3/uL (ref 1.7–7.7)
Neutrophils Relative %: 70 %
Platelet Count: 286 10*3/uL (ref 150–400)
RBC: 2.7 MIL/uL — ABNORMAL LOW (ref 4.22–5.81)
RDW: 18.9 % — ABNORMAL HIGH (ref 11.5–15.5)
WBC Count: 4.3 10*3/uL (ref 4.0–10.5)
nRBC: 0 % (ref 0.0–0.2)

## 2019-12-14 MED ORDER — HYDROCODONE-HOMATROPINE 5-1.5 MG/5ML PO SYRP: 5.0000 mL | ORAL_SOLUTION | Freq: Four times a day (QID) | ORAL | 0 refills | Status: DC | PRN

## 2019-12-14 NOTE — Progress Notes (Signed)
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  PRIOR THERAPY: Systemic chemotherapy with carboplatin for AUC of 5 and Alimta 500 mg/M2 every 3 weeks.  Status post 7 cycles.  Starting from cycle #4 he will be treated with carboplatin for AUC of 4 and Alimta 400 mg/M2.  CURRENT THERAPY: Observation.  INTERVAL HISTORY: Jeffrey [name] 85 y.o. male returns to the clinic today for follow-up visit accompanied by his wife.  The patient continues to complain of increasing fatigue and weakness as well as shortness of breath with exertion and persistent cough with thick mucus.  He is currently on Hycodan but use it only at nighttime.  He denied having any bleeding issues but has few bruises and ecchymosis from the treatment with Xarelto.  The patient denied having any chest pain or hemoptysis.  He denied having any fever or chills.  He has no nausea, vomiting, diarrhea or constipation.  He has no headache or visual changes.  He is here today for evaluation with repeat CT scan of the chest for restaging of his disease.  MEDICAL HISTORY: Past Medical History:  Diagnosis Date  . Anxiety   . Aortic stenosis    mild AS by 07/20/13 echo ([facility])  . Arthritis    "all over"  . Coronary artery disease    NSTEMI 06/2010, CABG x3=> [place]->LAD, SVG->OM1, SVG->PDA, DES LCx 10/2011, DES LAD 12/2011  . Depression   . GERD (gastroesophageal reflux disease)   . H/O hiatal hernia   . Hearing aid worn    B/L  . High cholesterol   . History of blood transfusion reaction 03/28/13   "he almost died; he has to get irradiated blood next time"  . Hypertension   . Hypothyroidism   .  Ischemic cardiomyopathy   . ITP (idiopathic thrombocytopenic purpura)    Dr. Gaynelle Arabian, on [place]  . Myocardial infarction ([place]) 28-Mar-2010  . Pneumonia 1940's X 1; 03-28-14 X 1  . Rheumatoid arthritis ([place])   . Shortness of breath    with exertion, has not been very active  . Sleep apnea   . Wears glasses   . Wears partial dentures     ALLERGIES:  is allergic to lipitor [atorvastatin]; methylprednisolone; morphine and related; other; sulfa antibiotics; ciprofloxacin; and doxycycline.  MEDICATIONS:  Current Outpatient Medications  Medication Sig Dispense Refill  . albuterol (VENTOLIN HFA) 108 (90 Base) MCG/ACT inhaler Inhale 1 puff into the lungs 2 (two) times daily as needed for shortness of breath.    . ALPRAZolam (XANAX) 0.25 MG tablet Take 1 tablet (0.25 mg total) by mouth 2 (two) times daily as needed for anxiety. 20 tablet 0  . aspirin EC 81 MG tablet Take 81 mg by mouth daily.    . chlorproMAZINE (THORAZINE) 25 MG tablet TAKE 1 TABLET BY MOUTH THREE TIMES DAILY (Patient taking differently: Take 25 mg by mouth 3 (three) times daily as needed for nausea. Before during and after chemo) 30 tablet 0  . dexamethasone (DECADRON) 4 MG tablet 4 mg p.o. twice daily the day before, day of and day after chemotherapy every 3 weeks (Patient taking differently: Take 16 mg by mouth as needed. 16 mg p.o. twice daily the day before,  day of and day after chemotherapy every 3 weeks) 40 tablet 1  . Fluticasone-Salmeterol (ADVAIR) 100-50 MCG/DOSE AEPB Inhale 1 puff into the lungs daily as needed (shortness of breath).     . folic acid (FOLVITE) 1 MG tablet Take 1 tablet (1 mg total) by mouth daily. 30 tablet 4  . HYDROcodone-homatropine (HYCODAN) 5-1.5 MG/5ML syrup Take 5 mLs by mouth every 6 (six) hours as needed for cough. 120 mL 0  . ipratropium-albuterol (DUONEB) 0.5-2.5 (3) MG/3ML SOLN Take 3 mLs by nebulization every 6 (six) hours as needed. J44.9 (Patient taking differently: Take 3 mLs by nebulization every 6  (six) hours as needed (shortness of breath). J44.9) 360 mL 3  . levothyroxine (SYNTHROID, LEVOTHROID) 200 MCG tablet Take 200 mcg by mouth See admin instructions. Take 200 mg tablet Monday through Friday - None on Saturday or Sunday.    [name] Kitchen omeprazole (PRILOSEC) 40 MG capsule Take 40 mg by mouth at bedtime.     . prochlorperazine (COMPAZINE) 10 MG tablet Take 1 tablet (10 mg total) by mouth every 6 (six) hours as needed for nausea or vomiting. 30 tablet 0  . guaiFENesin (MUCINEX) 600 MG 12 hr tablet Take 1 tablet (600 mg total) by mouth 2 (two) times daily. (Patient not taking: Reported on 11/10/2019) 60 tablet 1  . lidocaine-prilocaine (EMLA) cream Apply 1 application topically as needed. (Patient not taking: Reported on 12/14/2019) 30 g 0  . magic mouthwash SOLN Take 5 mLs by mouth 4 (four) times daily. Components-Benadryl 12.5 mg/84ml , hydrocortisone 60 mg, nystatin 30 ml (Patient not taking: Reported on 11/10/2019) 240 mL 1  . mirtazapine (REMERON) 15 MG tablet Take 15 mg by mouth daily as needed (sleep).     . rivaroxaban (XARELTO) 20 MG TABS tablet Take 20 mg by mouth daily with supper.     No current facility-administered medications for this visit.    SURGICAL HISTORY:  Past Surgical History:  Procedure Laterality Date  . APPENDECTOMY    . BACK SURGERY    . CATARACT EXTRACTION, BILATERAL    . CHEST TUBE INSERTION Right 05/20/2019   Procedure: INSERTION PLEURAL DRAINAGE CATHETER;  Surgeon: Ivin Poot, MD;  Location: [facility];  Service: Thoracic;  Laterality: Right;  . COLONOSCOPY    . CORONARY ANGIOPLASTY WITH STENT PLACEMENT     DES Lcx 10/2011, DES LAD 12/2011  . CORONARY ARTERY BYPASS GRAFT  2011   "CABG X3"  . CORONARY STENT INTERVENTION N/A 05/26/2019   Procedure: CORONARY STENT INTERVENTION;  Surgeon: Nelva Bush, MD;  Location: [facility];  Service: Cardiovascular;  Laterality: N/A;  . GASTROC RECESSION EXTREMITY Right 07/06/2015   Pasty Spillers (orthopedics-  [place], [place])  . HERNIA REPAIR    . HIATAL HERNIA REPAIR    . IR IMAGING GUIDED PORT INSERTION  06/29/2019  . KNEE ARTHROSCOPY Left 06/22/2014   w/I&D  . KNEE ARTHROSCOPY Left 06/22/2014   Procedure: IRRIGATION AND DEBRIDEMENT WITH CHRONDROPLASTY;  Surgeon: Hessie Dibble, MD;  Location: [facility];  Service: Orthopedics;  Laterality: Left;  . LEFT HEART CATH AND CORS/GRAFTS ANGIOGRAPHY N/A 05/26/2019   Procedure: LEFT HEART CATH AND CORS/GRAFTS ANGIOGRAPHY;  Surgeon: Nelva Bush, MD;  Location: [facility];  Service: Cardiovascular;  Laterality: N/A;  . LUMBAR DISC SURGERY  1960's?  . MULTIPLE TOOTH EXTRACTIONS    . PLEURAL BIOPSY Right 05/20/2019   Procedure: PLEURAL BIOPSY;  Surgeon: Ivin Poot, MD;  Location: [facility];  Service: Thoracic;  Laterality: Right;  . REMOVAL OF PLEURAL DRAINAGE CATHETER Right 11/15/2019   Procedure: REMOVAL OF PLEURAL DRAINAGE CATHETER;  Surgeon: Ivin Poot, MD;  Location: [facility];  Service: Thoracic;  Laterality: Right;  . SHOULDER ARTHROSCOPY Right 04/09/2017   Procedure: ARTHROSCOPY SHOULDER;  Surgeon: Melrose Nakayama, MD;  Location: [facility];  Service: Orthopedics;  Laterality: Right;  . STERNAL CLOSURE     "wires from OHS taken out; plate put in" (05/30/6294)  . SYNOVECTOMY Left 08/17/2014   Procedure: SYNOVECTOMY LEFT KNEE;  Surgeon: Hessie Dibble, MD;  Location: [facility];  Service: Orthopedics;  Laterality: Left;  . TOTAL ANKLE ARTHROPLASTY Right 07/06/2015   Pasty Spillers (orthopedics- [facility])  . TOTAL HIP ARTHROPLASTY Right 06/29/2018   Procedure: RIGHT TOTAL HIP ARTHROPLASTY ANTERIOR APPROACH;  Surgeon: Melrose Nakayama, MD;  Location: [facility];  Service: Orthopedics;  Laterality: Right;  . TOTAL HIP ARTHROPLASTY Left 06/09/2016   Scott Streater Claiborne Billings (orthopedics- [place])  . TOTAL KNEE ARTHROPLASTY Left 11/24/2014   Procedure: TOTAL KNEE ARTHROPLASTY;  Surgeon: Hessie Dibble, MD;  Location: [place] [facility];  Service: Orthopedics;  Laterality: Left;  [name] Kitchen  VIDEO ASSISTED THORACOSCOPY Right 05/20/2019   Procedure: VIDEO ASSISTED THORACOSCOPY;  Surgeon: Prescott Gum, Collier Salina, MD;  Location: [facility];  Service: Thoracic;  Laterality: Right;    REVIEW OF SYSTEMS:  Constitutional: positive for fatigue Eyes: negative Ears, nose, mouth, throat, and face: negative Respiratory: positive for cough, dyspnea on exertion and sputum Cardiovascular: negative Gastrointestinal: negative Genitourinary:negative Integument/breast: negative Hematologic/lymphatic: negative Musculoskeletal:negative Neurological: negative Behavioral/Psych: negative Endocrine: negative Allergic/Immunologic: negative   PHYSICAL EXAMINATION: General appearance: alert, cooperative, fatigued and no distress Head: Normocephalic, without obvious abnormality, atraumatic Neck: no adenopathy, no JVD, supple, symmetrical, trachea midline and thyroid not enlarged, symmetric, no tenderness/mass/nodules Lymph nodes: Cervical, supraclavicular, and axillary nodes normal. Resp: clear to auscultation bilaterally Back: symmetric, no curvature. ROM normal. No CVA tenderness. Cardio: systolic murmur: systolic ejection 3/6, harsh at 2nd left intercostal space GI: soft, non-tender; bowel sounds normal; no masses,  no organomegaly Extremities: extremities normal, atraumatic, no cyanosis or edema Neurologic: Alert and oriented X 3, normal strength and tone. Normal symmetric reflexes. Normal coordination and gait  ECOG PERFORMANCE STATUS: 1 - Symptomatic but completely ambulatory  Blood pressure 118/79, pulse 91, temperature 97.8 F (36.6 C), temperature source Temporal, resp. rate 18, height 6\' 1"  (1.854 m), weight 188 lb 8 oz (85.5 kg), SpO2 96 %.  LABORATORY DATA: Lab Results  Component Value Date   WBC 4.3 12/14/2019   HGB 8.2 (L) 12/14/2019   HCT 25.9 (L) 12/14/2019   MCV 95.9 12/14/2019   PLT 286 12/14/2019      Chemistry      Component Value Date/Time   NA 141 11/22/2019 0935   K 4.7  11/22/2019 0935   CL 103 11/22/2019 0935   CO2 23 11/22/2019 0935   BUN 20 11/22/2019 0935   CREATININE 1.21 11/22/2019 0935   CREATININE 1.79 (H) 10/12/2014 1136      Component Value Date/Time   CALCIUM 9.2 11/22/2019 0935   ALKPHOS 57 11/22/2019 0935   AST 10 (L) 11/22/2019 0935   ALT 9 11/22/2019 0935   BILITOT <0.2 (L) 11/22/2019 0935       RADIOGRAPHIC STUDIES: CT Chest W Contrast  Result Date: 12/09/2019 CLINICAL DATA:  Malignant pleural mesothelioma. Status post induction chemotherapy. EXAM: CT CHEST WITH CONTRAST TECHNIQUE: Multidetector CT imaging of the chest was performed during intravenous contrast administration. CONTRAST:  53mL OMNIPAQUE IOHEXOL  300 MG/ML  SOLN COMPARISON:  09/12/2019 FINDINGS: Cardiovascular: The heart is normal in size. No pericardial effusion. Stable tortuosity and calcification of the thoracic aorta. No dissection. Stable advanced three-vessel coronary artery calcifications. Mediastinum/Nodes: Stable 16 mm AP window lymph node. 8 mm right hilar lymph node previously measured 13 mm. 9.5 mm subcarinal lymph node previously measured 13 mm. Lungs/Pleura: Stable emphysematous changes and pulmonary scarring with peripheral nodularity and interstitial thickening. Persistent complex right pleural effusion which contains gas and could be related to recent thoracentesis or PleurX drainage catheter. Stable appearing mild nodular enhancement around the pleura. Persistent overlying atelectasis and pulmonary scarring. No left-sided pleural effusion or pleural thickening. 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 Upper Abdomen: No significant upper abdominal findings. Stable right renal cyst. Musculoskeletal: No chest wall mass, supraclavicular or axillary adenopathy. Small scattered lymph nodes are stable. IMPRESSION: 1. Stable complex loculated right pleural fluid collection and mild enhancing nodularity. Gas in the pleural  effusion may be due to a recent thoracentesis or recent PleurX drainage catheter removal. 2. 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 3. Interval decrease in size of the mediastinal and hilar lymph nodes. 4. 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 5. Stable severe atherosclerotic calcifications involving the aorta and coronary arteries. Aortic Atherosclerosis (ICD10-I70.0) and Emphysema (ICD10-J43.9). Electronically Signed   By: Marijo Sanes M.D.   On: 12/09/2019 15:50    ASSESSMENT AND PLAN: This is a very pleasant 85 years old white male with malignant pleural mesothelioma, epithelioid type involving the right hemithorax. The patient started systemic chemotherapy with carboplatin for AUC of 5 and Alimta 500 mg/M2 status post 3 cycles.   The patient has been tolerating this treatment well except for increasing fatigue and weakness. He was recently completed treatment with Levaquin for suspicious pneumonia. I recommended for the patient to resume his systemic chemotherapy with carboplatin and Alimta.  The dose of carboplatin were reduced to AUC of 4 and Alimta to 400 mg/M2 every 3 weeks starting from cycle #4.  He is status post a total of 7 cycles of his chemotherapy. The patient tolerated his previous treatment well except for fatigue secondary to chemotherapy-induced anemia. He had repeat CT scan of the chest performed recently.  I personally and independently reviewed the scans and discussed the results with the patient and his wife today. His scan showed no concerning findings for disease progression and there was some mild improvement of his disease. I recommended for the patient to continue on observation with repeat CT scan of the chest in 3 months. For the anemia, I recommended for the patient to start taking oral iron tablets 1-2 tablets every day with vitamin C or orange juice. For the history of pulmonary  embolism, the patient will continue on Xarelto. For the cough, I will give the patient a refill of Hycodan today. The patient was advised to call immediately if he has any concerning symptoms in the interval. The patient voices understanding of current disease status and treatment options and is in agreement with the current care plan.  All questions were answered. The patient knows to call the clinic with any problems, questions or concerns. We can certainly see the patient much sooner if necessary.  Disclaimer: This note was dictated with voice recognition software. Similar sounding words can inadvertently be transcribed and may not be corrected upon review.

## 2019-12-14 NOTE — Telephone Encounter (Signed)
Scheduled per los. Gave avs and calendar  

## 2019-12-24 ENCOUNTER — Emergency Department (HOSPITAL_COMMUNITY): Payer: Medicare Other

## 2019-12-24 ENCOUNTER — Inpatient Hospital Stay (HOSPITAL_COMMUNITY)
Admission: EM | Admit: 2019-12-24 | Discharge: 2019-12-29 | DRG: 812 | Disposition: A | Discharge status: Skilled Nursing Facility | Payer: Medicare Other | Attending: Family Medicine | Admitting: Family Medicine

## 2019-12-24 ENCOUNTER — Encounter (HOSPITAL_COMMUNITY): Payer: Self-pay

## 2019-12-24 ENCOUNTER — Other Ambulatory Visit: Payer: Self-pay

## 2019-12-24 DIAGNOSIS — D649 Anemia, unspecified: Secondary | ICD-10-CM

## 2019-12-24 DIAGNOSIS — C450 Mesothelioma of pleura: Secondary | ICD-10-CM

## 2019-12-24 DIAGNOSIS — Z96652 Presence of left artificial knee joint: Secondary | ICD-10-CM | POA: Diagnosis present

## 2019-12-24 DIAGNOSIS — D6481 Anemia due to antineoplastic chemotherapy: Secondary | ICD-10-CM | POA: Diagnosis not present

## 2019-12-24 DIAGNOSIS — Z79890 Hormone replacement therapy (postmenopausal): Secondary | ICD-10-CM

## 2019-12-24 DIAGNOSIS — E039 Hypothyroidism, unspecified: Secondary | ICD-10-CM | POA: Diagnosis present

## 2019-12-24 DIAGNOSIS — C459 Mesothelioma, unspecified: Secondary | ICD-10-CM | POA: Diagnosis not present

## 2019-12-24 DIAGNOSIS — Z974 Presence of external hearing-aid: Secondary | ICD-10-CM

## 2019-12-24 DIAGNOSIS — Z882 Allergy status to sulfonamides status: Secondary | ICD-10-CM

## 2019-12-24 DIAGNOSIS — M069 Rheumatoid arthritis, unspecified: Secondary | ICD-10-CM | POA: Diagnosis present

## 2019-12-24 DIAGNOSIS — Z8261 Family history of arthritis: Secondary | ICD-10-CM

## 2019-12-24 DIAGNOSIS — E7800 Pure hypercholesterolemia, unspecified: Secondary | ICD-10-CM | POA: Diagnosis present

## 2019-12-24 DIAGNOSIS — Z96643 Presence of artificial hip joint, bilateral: Secondary | ICD-10-CM | POA: Diagnosis present

## 2019-12-24 DIAGNOSIS — J449 Chronic obstructive pulmonary disease, unspecified: Secondary | ICD-10-CM | POA: Diagnosis present

## 2019-12-24 DIAGNOSIS — Z820 Family history of epilepsy and other diseases of the nervous system: Secondary | ICD-10-CM

## 2019-12-24 DIAGNOSIS — Z951 Presence of aortocoronary bypass graft: Secondary | ICD-10-CM

## 2019-12-24 DIAGNOSIS — F419 Anxiety disorder, unspecified: Secondary | ICD-10-CM | POA: Diagnosis present

## 2019-12-24 DIAGNOSIS — M159 Polyosteoarthritis, unspecified: Secondary | ICD-10-CM | POA: Diagnosis present

## 2019-12-24 DIAGNOSIS — T451X5A Adverse effect of antineoplastic and immunosuppressive drugs, initial encounter: Secondary | ICD-10-CM | POA: Diagnosis present

## 2019-12-24 DIAGNOSIS — Z20822 Contact with and (suspected) exposure to covid-19: Secondary | ICD-10-CM | POA: Diagnosis present

## 2019-12-24 DIAGNOSIS — Z87891 Personal history of nicotine dependence: Secondary | ICD-10-CM

## 2019-12-24 DIAGNOSIS — I25709 Atherosclerosis of coronary artery bypass graft(s), unspecified, with unspecified angina pectoris: Secondary | ICD-10-CM

## 2019-12-24 DIAGNOSIS — Z86711 Personal history of pulmonary embolism: Secondary | ICD-10-CM

## 2019-12-24 DIAGNOSIS — Z8249 Family history of ischemic heart disease and other diseases of the circulatory system: Secondary | ICD-10-CM

## 2019-12-24 DIAGNOSIS — R0600 Dyspnea, unspecified: Secondary | ICD-10-CM

## 2019-12-24 DIAGNOSIS — G20 Parkinson's disease: Secondary | ICD-10-CM | POA: Diagnosis present

## 2019-12-24 DIAGNOSIS — Z7901 Long term (current) use of anticoagulants: Secondary | ICD-10-CM

## 2019-12-24 DIAGNOSIS — Z86718 Personal history of other venous thrombosis and embolism: Secondary | ICD-10-CM

## 2019-12-24 DIAGNOSIS — J410 Simple chronic bronchitis: Secondary | ICD-10-CM

## 2019-12-24 DIAGNOSIS — I252 Old myocardial infarction: Secondary | ICD-10-CM

## 2019-12-24 DIAGNOSIS — Z885 Allergy status to narcotic agent status: Secondary | ICD-10-CM

## 2019-12-24 DIAGNOSIS — Z7982 Long term (current) use of aspirin: Secondary | ICD-10-CM

## 2019-12-24 DIAGNOSIS — I350 Nonrheumatic aortic (valve) stenosis: Secondary | ICD-10-CM | POA: Diagnosis present

## 2019-12-24 DIAGNOSIS — Z881 Allergy status to other antibiotic agents status: Secondary | ICD-10-CM

## 2019-12-24 DIAGNOSIS — I2510 Atherosclerotic heart disease of native coronary artery without angina pectoris: Secondary | ICD-10-CM | POA: Diagnosis present

## 2019-12-24 DIAGNOSIS — K449 Diaphragmatic hernia without obstruction or gangrene: Secondary | ICD-10-CM | POA: Diagnosis present

## 2019-12-24 DIAGNOSIS — G4730 Sleep apnea, unspecified: Secondary | ICD-10-CM | POA: Diagnosis present

## 2019-12-24 DIAGNOSIS — Z66 Do not resuscitate: Secondary | ICD-10-CM | POA: Diagnosis present

## 2019-12-24 DIAGNOSIS — K219 Gastro-esophageal reflux disease without esophagitis: Secondary | ICD-10-CM | POA: Diagnosis present

## 2019-12-24 DIAGNOSIS — R079 Chest pain, unspecified: Secondary | ICD-10-CM

## 2019-12-24 DIAGNOSIS — Z888 Allergy status to other drugs, medicaments and biological substances status: Secondary | ICD-10-CM

## 2019-12-24 DIAGNOSIS — I25810 Atherosclerosis of coronary artery bypass graft(s) without angina pectoris: Secondary | ICD-10-CM | POA: Diagnosis present

## 2019-12-24 DIAGNOSIS — Z9981 Dependence on supplemental oxygen: Secondary | ICD-10-CM

## 2019-12-24 DIAGNOSIS — D693 Immune thrombocytopenic purpura: Secondary | ICD-10-CM | POA: Diagnosis present

## 2019-12-24 DIAGNOSIS — I255 Ischemic cardiomyopathy: Secondary | ICD-10-CM | POA: Diagnosis present

## 2019-12-24 DIAGNOSIS — I10 Essential (primary) hypertension: Secondary | ICD-10-CM | POA: Diagnosis present

## 2019-12-24 LAB — CBC WITH DIFFERENTIAL/PLATELET
Abs Immature Granulocytes: 0.1 10*3/uL — ABNORMAL HIGH (ref 0.00–0.07)
Basophils Absolute: 0 10*3/uL (ref 0.0–0.1)
Basophils Relative: 0 %
Eosinophils Absolute: 0.1 10*3/uL (ref 0.0–0.5)
Eosinophils Relative: 1 %
HCT: 20.2 % — ABNORMAL LOW (ref 39.0–52.0)
Hemoglobin: 6.2 g/dL — CL (ref 13.0–17.0)
Immature Granulocytes: 1 %
Lymphocytes Relative: 12 %
Lymphs Abs: 1.5 10*3/uL (ref 0.7–4.0)
MCH: 30.7 pg (ref 26.0–34.0)
MCHC: 30.7 g/dL (ref 30.0–36.0)
MCV: 100 fL (ref 80.0–100.0)
Monocytes Absolute: 2.2 10*3/uL — ABNORMAL HIGH (ref 0.1–1.0)
Monocytes Relative: 17 %
Neutro Abs: 8.7 10*3/uL — ABNORMAL HIGH (ref 1.7–7.7)
Neutrophils Relative %: 69 %
Platelets: 146 10*3/uL — ABNORMAL LOW (ref 150–400)
RBC: 2.02 MIL/uL — ABNORMAL LOW (ref 4.22–5.81)
RDW: 20.2 % — ABNORMAL HIGH (ref 11.5–15.5)
WBC: 12.7 10*3/uL — ABNORMAL HIGH (ref 4.0–10.5)
nRBC: 0 % (ref 0.0–0.2)

## 2019-12-24 LAB — COMPREHENSIVE METABOLIC PANEL
ALT: 15 U/L (ref 0–44)
AST: 21 U/L (ref 15–41)
Albumin: 2.6 g/dL — ABNORMAL LOW (ref 3.5–5.0)
Alkaline Phosphatase: 72 U/L (ref 38–126)
Anion gap: 10 (ref 5–15)
BUN: 24 mg/dL — ABNORMAL HIGH (ref 8–23)
CO2: 23 mmol/L (ref 22–32)
Calcium: 8.1 mg/dL — ABNORMAL LOW (ref 8.9–10.3)
Chloride: 103 mmol/L (ref 98–111)
Creatinine, Ser: 1.35 mg/dL — ABNORMAL HIGH (ref 0.61–1.24)
GFR calc Af Amer: 56 mL/min — ABNORMAL LOW (ref >60)
GFR calc non Af Amer: 48 mL/min — ABNORMAL LOW (ref >60)
Glucose, Bld: 109 mg/dL — ABNORMAL HIGH (ref 70–99)
Potassium: 4.6 mmol/L (ref 3.5–5.1)
Sodium: 136 mmol/L (ref 135–145)
Total Bilirubin: 0.8 mg/dL (ref 0.3–1.2)
Total Protein: 6.4 g/dL — ABNORMAL LOW (ref 6.5–8.1)

## 2019-12-24 LAB — RESPIRATORY PANEL BY RT PCR (FLU A&B, COVID)
Influenza A by PCR: NEGATIVE
Influenza B by PCR: NEGATIVE
SARS Coronavirus 2 by RT PCR: NEGATIVE

## 2019-12-24 LAB — PREPARE RBC (CROSSMATCH)

## 2019-12-24 LAB — LACTIC ACID, PLASMA: Lactic Acid, Venous: 1.5 mmol/L (ref 0.5–1.9)

## 2019-12-24 LAB — MRSA PCR SCREENING: MRSA by PCR: NEGATIVE

## 2019-12-24 LAB — ABO/RH: ABO/RH(D): A POS

## 2019-12-24 MED ORDER — MIRTAZAPINE 15 MG PO TABS
15.0000 mg | ORAL_TABLET | Freq: Every day | ORAL | Status: DC
  Administered 2019-12-24 – 2019-12-28 (×5): 15 mg via ORAL
  Filled 2019-12-24 (×5): qty 1

## 2019-12-24 MED ORDER — MOMETASONE FURO-FORMOTEROL FUM 100-5 MCG/ACT IN AERO
2.0000 | INHALATION_SPRAY | Freq: Two times a day (BID) | RESPIRATORY_TRACT | Status: DC
  Administered 2019-12-24 – 2019-12-29 (×10): 2 via RESPIRATORY_TRACT
  Filled 2019-12-24 (×2): qty 8.8

## 2019-12-24 MED ORDER — IPRATROPIUM-ALBUTEROL 0.5-2.5 (3) MG/3ML IN SOLN
3.0000 mL | Freq: Four times a day (QID) | RESPIRATORY_TRACT | Status: DC | PRN
  Administered 2019-12-26: 3 mL via RESPIRATORY_TRACT
  Filled 2019-12-24: qty 3

## 2019-12-24 MED ORDER — LEVOTHYROXINE SODIUM 100 MCG PO TABS
200.0000 ug | ORAL_TABLET | ORAL | Status: DC
  Administered 2019-12-26 – 2019-12-29 (×4): 200 ug via ORAL
  Filled 2019-12-24 (×4): qty 2

## 2019-12-24 MED ORDER — ALBUTEROL SULFATE (2.5 MG/3ML) 0.083% IN NEBU: 3.0000 mL | INHALATION_SOLUTION | Freq: Two times a day (BID) | RESPIRATORY_TRACT | Status: DC | PRN

## 2019-12-24 MED ORDER — PANTOPRAZOLE SODIUM 40 MG PO TBEC
40.0000 mg | DELAYED_RELEASE_TABLET | Freq: Every day | ORAL | Status: DC
  Administered 2019-12-24 – 2019-12-29 (×6): 40 mg via ORAL
  Filled 2019-12-24 (×6): qty 1

## 2019-12-24 MED ORDER — DIPHENHYDRAMINE HCL 50 MG/ML IJ SOLN
25.0000 mg | Freq: Once | INTRAMUSCULAR | Status: AC
  Administered 2019-12-24: 25 mg via INTRAVENOUS
  Filled 2019-12-24: qty 1

## 2019-12-24 MED ORDER — SODIUM CHLORIDE 0.9% IV SOLUTION: Freq: Once | INTRAVENOUS | Status: AC

## 2019-12-24 MED ORDER — CHLORHEXIDINE GLUCONATE CLOTH 2 % EX PADS
6.0000 | MEDICATED_PAD | Freq: Every day | CUTANEOUS | Status: DC
  Administered 2019-12-25 – 2019-12-29 (×5): 6 via TOPICAL

## 2019-12-24 MED ORDER — PROCHLORPERAZINE MALEATE 5 MG PO TABS: 10.0000 mg | ORAL_TABLET | Freq: Four times a day (QID) | ORAL | Status: DC | PRN

## 2019-12-24 MED ORDER — ONDANSETRON HCL 4 MG/2ML IJ SOLN: 4.0000 mg | Freq: Four times a day (QID) | INTRAMUSCULAR | Status: DC | PRN

## 2019-12-24 MED ORDER — RIVAROXABAN 20 MG PO TABS
20.0000 mg | ORAL_TABLET | Freq: Every day | ORAL | Status: DC
  Administered 2019-12-24: 20 mg via ORAL
  Filled 2019-12-24: qty 1

## 2019-12-24 MED ORDER — ALPRAZOLAM 0.25 MG PO TABS
0.2500 mg | ORAL_TABLET | Freq: Two times a day (BID) | ORAL | Status: DC | PRN
  Administered 2019-12-24 – 2019-12-28 (×6): 0.25 mg via ORAL
  Filled 2019-12-24 (×6): qty 1

## 2019-12-24 MED ORDER — FOLIC ACID 1 MG PO TABS
1.0000 mg | ORAL_TABLET | Freq: Every day | ORAL | Status: DC
  Administered 2019-12-24 – 2019-12-29 (×6): 1 mg via ORAL
  Filled 2019-12-24 (×6): qty 1

## 2019-12-24 MED ORDER — IOHEXOL 300 MG/ML  SOLN
75.0000 mL | Freq: Once | INTRAMUSCULAR | Status: AC | PRN
  Administered 2019-12-24: 75 mL via INTRAVENOUS

## 2019-12-24 MED ORDER — ASPIRIN EC 81 MG PO TBEC
81.0000 mg | DELAYED_RELEASE_TABLET | Freq: Every day | ORAL | Status: DC
  Administered 2019-12-24 – 2019-12-29 (×6): 81 mg via ORAL
  Filled 2019-12-24 (×6): qty 1

## 2019-12-24 MED ORDER — SODIUM CHLORIDE 0.9 % IV BOLUS
1000.0000 mL | Freq: Once | INTRAVENOUS | Status: AC
  Administered 2019-12-24: 1000 mL via INTRAVENOUS

## 2019-12-24 MED ORDER — LEVOTHYROXINE SODIUM 100 MCG PO TABS: 200.0000 ug | ORAL_TABLET | Freq: Every day | ORAL | Status: DC

## 2019-12-24 MED ORDER — SODIUM CHLORIDE 0.9 % IV BOLUS
500.0000 mL | Freq: Once | INTRAVENOUS | Status: AC
  Administered 2019-12-24: 500 mL via INTRAVENOUS

## 2019-12-24 MED ORDER — HYDROCODONE-HOMATROPINE 5-1.5 MG/5ML PO SYRP
5.0000 mL | ORAL_SOLUTION | Freq: Four times a day (QID) | ORAL | Status: DC | PRN
  Administered 2019-12-24 – 2019-12-29 (×10): 5 mL via ORAL
  Filled 2019-12-24 (×10): qty 5

## 2019-12-24 MED ORDER — ONDANSETRON HCL 4 MG PO TABS: 4.0000 mg | ORAL_TABLET | Freq: Four times a day (QID) | ORAL | Status: DC | PRN

## 2019-12-24 NOTE — H&P (Signed)
History and Physical  IZELL LABAT KVQ:[id number] DOB: 1934-06-18 DOA: 12/24/2019  Referring physician: Dr Alvino Chapel, ED physician PCP: [name], MD  Outpatient Specialists:   Patient Coming From: home  Chief Complaint: Fatigue  HPI: KEVAL NAM is a 86 y.o. male with a history of coronary artery disease with NSTEMI in March 22, 2010 and status post CABG x3 vessels, mild aortic stenosis on echo in March 22, 2013, hypertension, hypothyroidism, mesothelioma of the pleura, chemotherapy-induced anemia.  Back in 03-22-2013, the patient had a transfusion reaction and was instructed to always receive blood products that have been irradiated.  Patient comes in fatigued over the past couple of weeks.  Fatigue is worse with exertion and ambulation and improved with rest.  No other palliating or provoking factors.  Denies weight loss, weight gain.  He does have a chronic cough with pleurisy.  This appears to be chronic and has been going on for several months now.  Emergency Department Course: Blood work shows hemoglobin of 6.2.  Creatinine 1.35 (baseline is 1.03-1.2).  Chest x-ray without acute cardiopulmonary process.  CT chest pending  Review of Systems:   Pt denies any fevers, chills, nausea, vomiting, diarrhea, constipation, abdominal pain, shortness of breath, dyspnea on exertion, orthopnea, cough, wheezing, palpitations, headache, vision changes, lightheadedness, dizziness, melena, rectal bleeding.  Review of systems are otherwise negative  Past Medical History:  Diagnosis Date  . Anxiety   . Aortic stenosis    mild AS by 07/20/13 echo ([facility])  . Arthritis    "all over"  . Coronary artery disease    NSTEMI 06/2010, CABG x3=> [place]->LAD, SVG->OM1, SVG->PDA, DES LCx 10/2011, DES LAD 12/2011  . Depression   . GERD (gastroesophageal reflux disease)   . H/O hiatal hernia   . Hearing aid worn    B/L  . High cholesterol   . History of blood transfusion reaction March 22, 2013   "he almost  died; he has to get irradiated blood next time"  . Hypertension   . Hypothyroidism   . Ischemic cardiomyopathy   . ITP (idiopathic thrombocytopenic purpura)    Dr. Gaynelle Arabian, on [place]  . Myocardial infarction ([place]) 03/22/2010  . Pneumonia 1940's X 1; 03/22/2014 X 1  . Rheumatoid arthritis ([place])   . Shortness of breath    with exertion, has not been very active  . Sleep apnea   . Wears glasses   . Wears partial dentures    Past Surgical History:  Procedure Laterality Date  . APPENDECTOMY    . BACK SURGERY    . CATARACT EXTRACTION, BILATERAL    . CHEST TUBE INSERTION Right 05/20/2019   Procedure: INSERTION PLEURAL DRAINAGE CATHETER;  Surgeon: Ivin Poot, MD;  Location: [place];  Service: Thoracic;  Laterality: Right;  . COLONOSCOPY    . CORONARY ANGIOPLASTY WITH STENT PLACEMENT     DES Lcx 10/2011, DES LAD 12/2011  . CORONARY ARTERY BYPASS GRAFT  22-Mar-2010   "CABG X3"  . CORONARY STENT INTERVENTION N/A 05/26/2019   Procedure: CORONARY STENT INTERVENTION;  Surgeon: Nelva Bush, MD;  Location: [facility];  Service: Cardiovascular;  Laterality: N/A;  . GASTROC RECESSION EXTREMITY Right 07/06/2015   Pasty Spillers (orthopedics- [place], [place])  . HERNIA REPAIR    . HIATAL HERNIA REPAIR    . IR IMAGING GUIDED PORT INSERTION  06/29/2019  . KNEE ARTHROSCOPY Left 06/22/2014   w/I&D  . KNEE ARTHROSCOPY Left 06/22/2014   Procedure: [place];  Surgeon: Collier Salina  Autumn Patty, MD;  Location: [facility];  Service: Orthopedics;  Laterality: Left;  . LEFT HEART CATH AND CORS/GRAFTS ANGIOGRAPHY N/A 05/26/2019   Procedure: LEFT HEART CATH AND CORS/GRAFTS ANGIOGRAPHY;  Surgeon: Nelva Bush, MD;  Location: [facility];  Service: Cardiovascular;  Laterality: N/A;  . LUMBAR DISC SURGERY  1960's?  . MULTIPLE TOOTH EXTRACTIONS    . PLEURAL BIOPSY Right 05/20/2019   Procedure: PLEURAL BIOPSY;  Surgeon: Ivin Poot, MD;  Location: [facility] [facility];  Service: Thoracic;  Laterality: Right;    . REMOVAL OF PLEURAL DRAINAGE CATHETER Right 11/15/2019   Procedure: REMOVAL OF PLEURAL DRAINAGE CATHETER;  Surgeon: Ivin Poot, MD;  Location: [facility];  Service: Thoracic;  Laterality: Right;  . SHOULDER ARTHROSCOPY Right 04/09/2017   Procedure: ARTHROSCOPY SHOULDER;  Surgeon: Melrose Nakayama, MD;  Location: [facility];  Service: Orthopedics;  Laterality: Right;  . STERNAL CLOSURE     "wires from OHS taken out; plate put in" (08/24/8181)  . SYNOVECTOMY Left 08/17/2014   Procedure: SYNOVECTOMY LEFT KNEE;  Surgeon: Hessie Dibble, MD;  Location: [facility];  Service: Orthopedics;  Laterality: Left;  . TOTAL ANKLE ARTHROPLASTY Right 07/06/2015   Pasty Spillers (orthopedics- [facility])  . TOTAL HIP ARTHROPLASTY Right 06/29/2018   Procedure: RIGHT TOTAL HIP ARTHROPLASTY ANTERIOR APPROACH;  Surgeon: Melrose Nakayama, MD;  Location: [facility];  Service: Orthopedics;  Laterality: Right;  . TOTAL HIP ARTHROPLASTY Left 06/09/2016   Scott Streater Claiborne Billings (orthopedics- [place])  . TOTAL KNEE ARTHROPLASTY Left 11/24/2014   Procedure: TOTAL KNEE ARTHROPLASTY;  Surgeon: Hessie Dibble, MD;  Location: [facility];  Service: Orthopedics;  Laterality: Left;  [name] Kitchen VIDEO ASSISTED THORACOSCOPY Right 05/20/2019   Procedure: VIDEO ASSISTED THORACOSCOPY;  Surgeon: Ivin Poot, MD;  Location: [facility];  Service: Thoracic;  Laterality: Right;   Social History:  reports that he quit smoking about 53 years ago. His smoking use included cigarettes. He has a 15.00 pack-year smoking history. He has quit using smokeless tobacco.  His smokeless tobacco use included chew. He reports that he does not drink alcohol or use drugs. Patient lives at home  Allergies  Allergen Reactions  . Lipitor [Atorvastatin] Other (See Comments)    muscle spasms cramps  . Methylprednisolone Other (See Comments)    cramps cramps  . Morphine And Related     Hallucinations at times  . Other     If patient is to receive blood - blood must be treated with  radiation because his body will not accept a normal infusion   . Sulfa Antibiotics Itching  . Ciprofloxacin Rash  . Doxycycline Rash    Doxycycline caused itching redness on scalp and head Doxycycline caused itching redness on scalp and head    Family History  Problem Relation Age of Onset  . Heart disease Other   . Arthritis Other   . Heart disease Mother   . Alzheimer's disease Father   . Rheum arthritis Sister   . Rheum arthritis Brother   . Healthy Son       Prior to Admission medications   Medication Sig Start Date End Date Taking? Authorizing Provider  albuterol (VENTOLIN HFA) 108 (90 Base) MCG/ACT inhaler Inhale 1 puff into the lungs 2 (two) times daily as needed for shortness of breath. 07/23/19  Yes [provider]  ALPRAZolam (XANAX) 0.25 MG tablet Take 1 tablet (0.25 mg total) by mouth 2 (two) times daily as needed for anxiety. 07/02/18  Yes Loni Dolly,  PA-C  aspirin EC 81 MG tablet Take 81 mg by mouth daily.   Yes [provider]  chlorproMAZINE (THORAZINE) 25 MG tablet TAKE 1 TABLET BY MOUTH THREE TIMES DAILY Patient taking differently: Take 25 mg by mouth 3 (three) times daily as needed for nausea. Before during and after chemo 10/14/19  Yes Curt Bears, MD  Fluticasone-Salmeterol (ADVAIR) 100-50 MCG/DOSE AEPB Inhale 1 puff into the lungs 2 (two) times daily.    Yes [provider]  folic acid (FOLVITE) 1 MG tablet Take 1 tablet (1 mg total) by mouth daily. 06/16/19  Yes Curt Bears, MD  HYDROcodone-homatropine [facility]) 5-1.5 MG/5ML syrup Take 5 mLs by mouth every 6 (six) hours as needed for cough. 12/14/19  Yes Curt Bears, MD  ipratropium-albuterol (DUONEB) 0.5-2.5 (3) MG/3ML SOLN Take 3 mLs by nebulization every 6 (six) hours as needed. J44.9 Patient taking differently: Take 3 mLs by nebulization every 6 (six) hours as needed (shortness of breath). J44.9 07/11/19  Yes Martyn Ehrich, NP  levothyroxine (SYNTHROID, LEVOTHROID)  200 MCG tablet Take 200 mcg by mouth See admin instructions. Take 200 mg tablet Monday through Friday - None on Saturday or Sunday.   Yes [provider]  mirtazapine (REMERON) 30 MG tablet Take 15 mg by mouth at bedtime.  12/17/19  Yes [provider]  omeprazole (PRILOSEC) 40 MG capsule Take 40 mg by mouth at bedtime.    Yes [provider]  prochlorperazine (COMPAZINE) 10 MG tablet Take 1 tablet (10 mg total) by mouth every 6 (six) hours as needed for nausea or vomiting. 08/09/19  Yes Rai, Ripudeep K, MD  rivaroxaban (XARELTO) 20 MG TABS tablet Take 20 mg by mouth daily with supper.   Yes [provider]  dexamethasone (DECADRON) 4 MG tablet 4 mg p.o. twice daily the day before, day of and day after chemotherapy every 3 weeks Patient not taking: Reported on 12/24/2019 11/01/19   Heilingoetter, Cassandra L, PA-C  guaiFENesin (MUCINEX) 600 MG 12 hr tablet Take 1 tablet (600 mg total) by mouth 2 (two) times daily. Patient not taking: Reported on 11/10/2019 08/23/19   Martyn Ehrich, NP  lidocaine-prilocaine (EMLA) cream Apply 1 application topically as needed. Patient not taking: Reported on 12/14/2019 06/21/19   Curt Bears, MD  magic mouthwash SOLN Take 5 mLs by mouth 4 (four) times daily. Components-Benadryl 12.5 mg/68ml , hydrocortisone 60 mg, nystatin 30 ml Patient not taking: Reported on 11/10/2019 10/19/19   Curt Bears, MD  rOPINIRole (REQUIP) 0.5 MG tablet Take 0.5 mg by mouth at bedtime.  09/12/19  [provider]    Physical Exam: BP (!) 95/59   Pulse 87   Temp 98.2 F (36.8 C) (Oral)   Resp 20   Ht 6\' 1"  (1.854 m)   Wt 85.3 kg   SpO2 100%   BMI 24.80 kg/m   . General: Elderly male. Awake and alert and oriented x3. No acute cardiopulmonary distress.  [name] Kitchen HEENT: Normocephalic atraumatic.  Right and left ears normal in appearance.  Pupils equal, round, reactive to light. Extraocular muscles are intact. Sclerae anicteric and  noninjected.  Pale conjunctiva. dry mucosal membranes. No mucosal lesions.  . Neck: Neck supple without lymphadenopathy. No carotid bruits. No masses palpated.  . Cardiovascular: Regular rate with normal S1-S2 sounds. No murmurs, rubs, gallops auscultated. No JVD.  [name] Kitchen Respiratory: Diminished respiratory efforts.  No wheezes, rales, rhonchi.  No accessory muscle use. . Abdomen: Soft, nontender, nondistended. Active bowel sounds. No masses  or hepatosplenomegaly  . Skin: No rashes, lesions, or ulcerations.  Dry, warm to touch. 2+ dorsalis pedis and radial pulses. . Musculoskeletal: No calf or leg pain. All major joints not erythematous nontender.  No upper or lower joint deformation.  Good ROM.  No contractures  . Psychiatric: Intact judgment and insight. Pleasant and cooperative. . Neurologic: No focal neurological deficits. Strength is 5/5 and symmetric in upper and lower extremities.  Cranial nerves II through XII are grossly intact.           Labs on Admission: I have personally reviewed following labs and imaging studies  CBC: Recent Labs  Lab 12/24/19 1424  WBC 12.7*  NEUTROABS 8.7*  HGB 6.2*  HCT 20.2*  MCV 100.0  PLT 846*   Basic Metabolic Panel: Recent Labs  Lab 12/24/19 1424  NA 136  K 4.6  CL 103  CO2 23  GLUCOSE 109*  BUN 24*  CREATININE 1.35*  CALCIUM 8.1*   GFR: Estimated Creatinine Clearance: 46.9 mL/min (A) (by C-G formula based on SCr of 1.35 mg/dL (H)). Liver Function Tests: Recent Labs  Lab 12/24/19 1424  AST 21  ALT 15  ALKPHOS 72  BILITOT 0.8  PROT 6.4*  ALBUMIN 2.6*   No results for input(s): LIPASE, AMYLASE in the last 168 hours. No results for input(s): AMMONIA in the last 168 hours. Coagulation Profile: No results for input(s): INR, PROTIME in the last 168 hours. Cardiac Enzymes: No results for input(s): CKTOTAL, CKMB, CKMBINDEX, TROPONINI in the last 168 hours. BNP (last 3 results) No results for input(s): PROBNP in the last 8760  hours. HbA1C: No results for input(s): HGBA1C in the last 72 hours. CBG: No results for input(s): GLUCAP in the last 168 hours. Lipid Profile: No results for input(s): CHOL, HDL, LDLCALC, TRIG, CHOLHDL, LDLDIRECT in the last 72 hours. Thyroid Function Tests: No results for input(s): TSH, T4TOTAL, FREET4, T3FREE, THYROIDAB in the last 72 hours. Anemia Panel: No results for input(s): VITAMINB12, FOLATE, FERRITIN, TIBC, IRON, RETICCTPCT in the last 72 hours. Urine analysis:    Component Value Date/Time   COLORURINE YELLOW 09/12/2019 0950   APPEARANCEUR CLEAR 09/12/2019 0950   LABSPEC 1.015 09/12/2019 0950   PHURINE 7.0 09/12/2019 0950   GLUCOSEU NEGATIVE 09/12/2019 0950   HGBUR SMALL (A) 09/12/2019 0950   BILIRUBINUR NEGATIVE 09/12/2019 0950   KETONESUR NEGATIVE 09/12/2019 0950   PROTEINUR NEGATIVE 09/12/2019 0950   UROBILINOGEN 0.2 11/22/2014 1022   NITRITE NEGATIVE 09/12/2019 0950   LEUKOCYTESUR TRACE (A) 09/12/2019 0950   Sepsis Labs: @LABRCNTIP (procalcitonin:4,lacticidven:4) ) Recent Results (from the past 240 hour(s))  Respiratory Panel by RT PCR (Flu A&B, Covid) - Nasopharyngeal Swab     Status: None   Collection Time: 12/24/19  3:29 PM   Specimen: Nasopharyngeal Swab  Result Value Ref Range Status   SARS Coronavirus 2 by RT PCR NEGATIVE NEGATIVE Final    Comment: (NOTE) SARS-CoV-2 target nucleic acids are NOT DETECTED. The SARS-CoV-2 RNA is generally detectable in upper respiratoy specimens during the acute phase of infection. The lowest concentration of SARS-CoV-2 viral copies this assay can detect is 131 copies/mL. A negative result does not preclude SARS-Cov-2 infection and should not be used as the sole basis for treatment or other patient management decisions. A negative result may occur with  improper specimen collection/handling, submission of specimen other than nasopharyngeal swab, presence of viral mutation(s) within the areas targeted by this assay, and  inadequate number of viral copies (<131 copies/mL). A negative result must  be combined with clinical observations, patient history, and epidemiological information. The expected result is Negative. Fact Sheet for Patients:  PinkCheek.be Fact Sheet for Healthcare Providers:  GravelBags.it This test is not yet ap proved or cleared by the [place] FDA and  has been authorized for detection and/or diagnosis of SARS-CoV-2 by FDA under an Emergency Use Authorization (EUA). This EUA will remain  in effect (meaning this test can be used) for the duration of the COVID-19 declaration under Section 564(b)(1) of the Act, 21 U.S.C. section 360bbb-3(b)(1), unless the authorization is terminated or revoked sooner.    Influenza A by PCR NEGATIVE NEGATIVE Final   Influenza B by PCR NEGATIVE NEGATIVE Final    Comment: (NOTE) The Xpert Xpress SARS-CoV-2/FLU/RSV assay is intended as an aid in  the diagnosis of influenza from Nasopharyngeal swab specimens and  should not be used as a sole basis for treatment. Nasal washings and  aspirates are unacceptable for Xpert Xpress SARS-CoV-2/FLU/RSV  testing. Fact Sheet for Patients: PinkCheek.be Fact Sheet for Healthcare Providers: GravelBags.it This test is not yet approved or cleared by the [place] FDA and  has been authorized for detection and/or diagnosis of SARS-CoV-2 by  FDA under an Emergency Use Authorization (EUA). This EUA will remain  in effect (meaning this test can be used) for the duration of the  Covid-19 declaration under Section 564(b)(1) of the Act, 21  U.S.C. section 360bbb-3(b)(1), unless the authorization is  terminated or revoked. Performed at [facility], [address] [place], [place] [address]      Radiological Exams on Admission: DG Chest Portable 1 View  Result Date: 12/24/2019 CLINICAL DATA:  Cough and  right-sided chest pain, history of lung carcinoma EXAM: PORTABLE CHEST 1 VIEW COMPARISON:  12/09/2019 FINDINGS: Cardiac shadow is enlarged but stable. Postsurgical changes are again noted. Right chest wall port is seen. Pleural thickening and scarring in the right base is again seen and stable. No acute bony abnormality noted. IMPRESSION: Chronic changes in the right base.  No acute abnormality is seen. Electronically Signed   By: Inez Catalina M.D.   On: 12/24/2019 13:59    EKG: Independently reviewed.  Sinus rhythm with left atrial enlargement.  Pleat left bundle branch block.  No acute ST changes.  Assessment/Plan: Principal Problem:   Symptomatic anemia Active Problems:   CAD (coronary artery disease) of artery bypass graft   Malignant mesothelioma of pleura (HCC)   History of pulmonary embolism   COPD (chronic obstructive pulmonary disease) (HCC)   Antineoplastic chemotherapy induced anemia    This patient was discussed with the ED physician, including pertinent vitals, physical exam findings, labs, and imaging.  We also discussed care given by the ED provider.  Symptomatic anemia  Observation  Transfused 2 units of blood, which will have to be irradiated.  This will need to be ordered from [facility].  I did discuss this with blood bank and they are in the process of obtaining the blood.  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

## 2019-12-24 NOTE — Progress Notes (Signed)
1 L NS bolus ordered to be given, maintain map above 60. Contact MD if not above 60 after 1L NS bolus is complete.

## 2019-12-24 NOTE — ED Provider Notes (Signed)
[facility] EMERGENCY DEPARTMENT Provider Note   CSN: [id number] Arrival date & time: 12/24/19  03-02-1212     History Chief Complaint  Patient presents with  . Cough  . Back Pain    Jeffrey [name] is a 86 y.o. male.  HPI patient presents with right-sided chest pain. Worsening with coughing. Has had a cough since April. Has known mesothelioma on treatment and has had pleural effusion on the right side and had a drain in for a while. States it hurts at the site where the drain was. No fevers. Has some cough medicine use occasionally. On chronic oxygen 2 L. Last chemo was 3 weeks ago. Had CT scan done around 2 weeks ago that showed improvement of disease but had pleural effusion. No swelling on his legs. Is on anticoagulation for previous bone embolism. Symptoms have been worse over the last few days    Past Medical History:  Diagnosis Date  . Anxiety   . Aortic stenosis    mild AS by 07/20/13 echo ([facility])  . Arthritis    "all over"  . Coronary artery disease    NSTEMI 06/2010, CABG x3=> [place]->LAD, SVG->OM1, SVG->PDA, DES LCx 10/2011, DES LAD 12/2011  . Depression   . GERD (gastroesophageal reflux disease)   . H/O hiatal hernia   . Hearing aid worn    B/L  . High cholesterol   . History of blood transfusion reaction 2013-03-02   "he almost died; he has to get irradiated blood next time"  . Hypertension   . Hypothyroidism   . Ischemic cardiomyopathy   . ITP (idiopathic thrombocytopenic purpura)    Dr. Gaynelle Arabian, on [place]  . Myocardial infarction ([place]) 2010/03/02  . Pneumonia 1940's X 1; 03/02/2014 X 1  . Rheumatoid arthritis ([place])   . Shortness of breath    with exertion, has not been very active  . Sleep apnea   . Wears glasses   . Wears partial dentures     Patient Active Problem List   Diagnosis Date Noted  . Antineoplastic chemotherapy induced anemia 09/13/2019  . Neutropenia ([place]) 09/06/2019  . COPD (chronic obstructive pulmonary disease) ([place]) 08/23/2019  .  Thrush 08/23/2019  . Pulmonary embolus ([place]) 08/05/2019  . Acute lower UTI 08/05/2019  . Pulmonary embolism ([place]) 08/05/2019  . Malignant mesothelioma of pleura ([place]) 06/16/2019  . Goals of care, counseling/discussion 06/16/2019  . Encounter for antineoplastic chemotherapy 06/16/2019  . NSTEMI (non-ST elevated myocardial infarction) ([place]) 06/01/2019  . Shortness of breath   . Coronary artery disease due to lipid rich plaque   . Elevated troponin   . Coronary artery disease involving native coronary artery of native heart with unstable angina pectoris ([place])   . Pleural effusion on right 05/14/2019  . Pancreatic lesion 05/14/2019  . PD (Parkinson's disease) ([place]) 12/02/2018  . Primary osteoarthritis of right hip 06/29/2018  . Primary osteoarthritis of left knee 11/24/2014  . Primary osteoarthritis of knee 11/24/2014  . Bacterial infection of knee joint ([place]) 08/17/2014  . Septic joint of left knee joint ([place]) 06/22/2014  . CAP (community acquired pneumonia) 01/26/2014  . CAD (coronary artery disease) of artery bypass graft 01/26/2014  . Hypothyroidism 01/26/2014  . Thrombocytopenia, unspecified ([place]) 01/26/2014  . Nausea alone 01/26/2014  . Effusion of knee joint, left 10/11/2012  . OA (osteoarthritis) of knee 10/11/2012  . HIP PAIN 11/15/2007  . 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  APPENDECTOMY    . BACK SURGERY    . CATARACT EXTRACTION, BILATERAL    . CHEST TUBE INSERTION Right 05/20/2019   Procedure: INSERTION PLEURAL DRAINAGE CATHETER;  Surgeon: Ivin Poot, MD;  Location: [place];  Service: Thoracic;  Laterality: Right;  . COLONOSCOPY    . CORONARY ANGIOPLASTY WITH STENT PLACEMENT     DES Lcx 10/2011, DES LAD 12/2011  . CORONARY ARTERY BYPASS GRAFT  2011   "CABG X3"  . CORONARY STENT INTERVENTION N/A 05/26/2019   Procedure: CORONARY STENT INTERVENTION;  Surgeon: Nelva Bush, MD;  Location:  [facility];  Service: Cardiovascular;  Laterality: N/A;  . GASTROC RECESSION EXTREMITY Right 07/06/2015   Pasty Spillers (orthopedics- [place], [place])  . HERNIA REPAIR    . HIATAL HERNIA REPAIR    . IR IMAGING GUIDED PORT INSERTION  06/29/2019  . KNEE ARTHROSCOPY Left 06/22/2014   w/I&D  . KNEE ARTHROSCOPY Left 06/22/2014   Procedure: IRRIGATION AND DEBRIDEMENT WITH CHRONDROPLASTY;  Surgeon: Hessie Dibble, MD;  Location: [place];  Service: Orthopedics;  Laterality: Left;  . LEFT HEART CATH AND CORS/GRAFTS ANGIOGRAPHY N/A 05/26/2019   Procedure: LEFT HEART CATH AND CORS/GRAFTS ANGIOGRAPHY;  Surgeon: Nelva Bush, MD;  Location: [facility];  Service: Cardiovascular;  Laterality: N/A;  . LUMBAR DISC SURGERY  1960's?  . MULTIPLE TOOTH EXTRACTIONS    . PLEURAL BIOPSY Right 05/20/2019   Procedure: PLEURAL BIOPSY;  Surgeon: Ivin Poot, MD;  Location: [place];  Service: Thoracic;  Laterality: Right;  . REMOVAL OF PLEURAL DRAINAGE CATHETER Right 11/15/2019   Procedure: REMOVAL OF PLEURAL DRAINAGE CATHETER;  Surgeon: Ivin Poot, MD;  Location: [place];  Service: Thoracic;  Laterality: Right;  . SHOULDER ARTHROSCOPY Right 04/09/2017   Procedure: ARTHROSCOPY SHOULDER;  Surgeon: Melrose Nakayama, MD;  Location: [place];  Service: Orthopedics;  Laterality: Right;  . STERNAL CLOSURE     "wires from OHS taken out; plate put in" (02/28/7672)  . SYNOVECTOMY Left 08/17/2014   Procedure: SYNOVECTOMY LEFT KNEE;  Surgeon: Hessie Dibble, MD;  Location: [place];  Service: Orthopedics;  Laterality: Left;  . TOTAL ANKLE ARTHROPLASTY Right 07/06/2015   Pasty Spillers (orthopedics- [facility])  . TOTAL HIP ARTHROPLASTY Right 06/29/2018   Procedure: RIGHT TOTAL HIP ARTHROPLASTY ANTERIOR APPROACH;  Surgeon: Melrose Nakayama, MD;  Location: [facility];  Service: Orthopedics;  Laterality: Right;  . TOTAL HIP ARTHROPLASTY Left 06/09/2016   Scott Streater Claiborne Billings (orthopedics- [place])  . TOTAL KNEE ARTHROPLASTY Left  11/24/2014   Procedure: TOTAL KNEE ARTHROPLASTY;  Surgeon: Hessie Dibble, MD;  Location: [place];  Service: Orthopedics;  Laterality: Left;  [name] Kitchen VIDEO ASSISTED THORACOSCOPY Right 05/20/2019   Procedure: VIDEO ASSISTED THORACOSCOPY;  Surgeon: Ivin Poot, MD;  Location: [facility];  Service: Thoracic;  Laterality: Right;       Family History  Problem Relation Age of Onset  . Heart disease Other   . Arthritis Other   . Heart disease Mother   . Alzheimer's disease Father   . Rheum arthritis Sister   . Rheum arthritis Brother   . Healthy Son     Social History   Tobacco Use  . Smoking status: Former Smoker    Packs/day: 1.00    Years: 15.00    Pack years: 15.00    Types: Cigarettes    Quit date: 12/22/1966    Years since quitting: 53.0  . Smokeless tobacco: Former Systems developer    Types: Chew  .  Tobacco comment: "quit smoking ~ late ~ 60's; quit chewing in the 1970's"  Substance Use Topics  . Alcohol use: No  . Drug use: No    Home Medications Prior to Admission medications   Medication Sig Start Date End Date Taking? Authorizing Provider  albuterol (VENTOLIN HFA) 108 (90 Base) MCG/ACT inhaler Inhale 1 puff into the lungs 2 (two) times daily as needed for shortness of breath. 07/23/19   [provider]  ALPRAZolam Duanne Moron) 0.25 MG tablet Take 1 tablet (0.25 mg total) by mouth 2 (two) times daily as needed for anxiety. 07/02/18   Loni Dolly, PA-C  aspirin EC 81 MG tablet Take 81 mg by mouth daily.    [provider]  chlorproMAZINE (THORAZINE) 25 MG tablet TAKE 1 TABLET BY MOUTH THREE TIMES DAILY Patient taking differently: Take 25 mg by mouth 3 (three) times daily as needed for nausea. Before during and after chemo 10/14/19   Curt Bears, MD  dexamethasone (DECADRON) 4 MG tablet 4 mg p.o. twice daily the day before, day of and day after chemotherapy every 3 weeks Patient taking differently: Take 16 mg by mouth as needed. 16 mg p.o. twice daily the day before, day of  and day after chemotherapy every 3 weeks 11/01/19   Heilingoetter, Cassandra L, PA-C  Fluticasone-Salmeterol (ADVAIR) 100-50 MCG/DOSE AEPB Inhale 1 puff into the lungs daily as needed (shortness of breath).     [provider]  folic acid (FOLVITE) 1 MG tablet Take 1 tablet (1 mg total) by mouth daily. 06/16/19   Curt Bears, MD  guaiFENesin (MUCINEX) 600 MG 12 hr tablet Take 1 tablet (600 mg total) by mouth 2 (two) times daily. Patient not taking: Reported on 11/10/2019 08/23/19   Martyn Ehrich, NP  HYDROcodone-homatropine [facility]) 5-1.5 MG/5ML syrup Take 5 mLs by mouth every 6 (six) hours as needed for cough. 12/14/19   Curt Bears, MD  ipratropium-albuterol (DUONEB) 0.5-2.5 (3) MG/3ML SOLN Take 3 mLs by nebulization every 6 (six) hours as needed. J44.9 Patient taking differently: Take 3 mLs by nebulization every 6 (six) hours as needed (shortness of breath). J44.9 07/11/19   Martyn Ehrich, NP  levothyroxine (SYNTHROID, LEVOTHROID) 200 MCG tablet Take 200 mcg by mouth See admin instructions. Take 200 mg tablet Monday through Friday - None on Saturday or Sunday.    [provider]  lidocaine-prilocaine (EMLA) cream Apply 1 application topically as needed. Patient not taking: Reported on 12/14/2019 06/21/19   Curt Bears, MD  magic mouthwash SOLN Take 5 mLs by mouth 4 (four) times daily. Components-Benadryl 12.5 mg/4ml , hydrocortisone 60 mg, nystatin 30 ml Patient not taking: Reported on 11/10/2019 10/19/19   Curt Bears, MD  mirtazapine (REMERON) 15 MG tablet Take 15 mg by mouth daily as needed (sleep).     [provider]  omeprazole (PRILOSEC) 40 MG capsule Take 40 mg by mouth at bedtime.     [provider]  prochlorperazine (COMPAZINE) 10 MG tablet Take 1 tablet (10 mg total) by mouth every 6 (six) hours as needed for nausea or vomiting. 08/09/19   Rai, Vernelle Emerald, MD  rivaroxaban (XARELTO) 20 MG TABS tablet Take 20 mg by mouth daily  with supper.    [provider]  rOPINIRole (REQUIP) 0.5 MG tablet Take 0.5 mg by mouth at bedtime.  09/12/19  [provider]    Allergies    Lipitor [atorvastatin], Methylprednisolone, Morphine and related, Other, Sulfa antibiotics, Ciprofloxacin, and Doxycycline  Review of Systems  Review of Systems  Constitutional: Negative for appetite change.  HENT: Negative for congestion.   Respiratory: Positive for cough. Negative for shortness of breath.   Cardiovascular: Positive for chest pain.  Gastrointestinal: Negative for abdominal pain.  Genitourinary: Negative for flank pain.  Musculoskeletal: Negative for back pain.  Skin: Negative for rash.  Neurological: Negative for weakness.  Psychiatric/Behavioral: Negative for confusion.    Physical Exam Updated Vital Signs BP 97/61   Pulse 85   Temp 98.2 F (36.8 C) (Oral)   Resp 20   Ht 6\' 1"  (1.854 m)   Wt 85.3 kg   SpO2 100%   BMI 24.80 kg/m   Physical Exam Vitals and nursing note reviewed.  Eyes:     Extraocular Movements: Extraocular movements intact.  Cardiovascular:     Rate and Rhythm: Regular rhythm.  Pulmonary:     Breath sounds: Normal breath sounds.     Comments: Tenderness of her right chest wall laterally and inferiorly, particular at the site of previous Pleurx catheter. No erythema. No vesicles. Chest:     Chest wall: Tenderness present.  Abdominal:     Palpations: Abdomen is soft.     Tenderness: There is no abdominal tenderness.  Musculoskeletal:     Right lower leg: No edema.     Left lower leg: No edema.  Skin:    General: Skin is warm.     Capillary Refill: Capillary refill takes less than 2 seconds.  Neurological:     Mental Status: He is alert and oriented to person, place, and time.     ED Results / Procedures / Treatments   Labs (all labs ordered are listed, but only abnormal results are displayed) Labs Reviewed  COMPREHENSIVE METABOLIC PANEL - Abnormal; Notable for the  following components:      Result Value   Glucose, Bld 109 (*)    BUN 24 (*)    Creatinine, Ser 1.35 (*)    Calcium 8.1 (*)    Total Protein 6.4 (*)    Albumin 2.6 (*)    GFR calc non Af Amer 48 (*)    GFR calc Af Amer 56 (*)    All other components within normal limits  CBC WITH DIFFERENTIAL/PLATELET - Abnormal; Notable for the following components:   WBC 12.7 (*)    RBC 2.02 (*)    Hemoglobin 6.2 (*)    HCT 20.2 (*)    RDW 20.2 (*)    Platelets 146 (*)    Neutro Abs 8.7 (*)    Monocytes Absolute 2.2 (*)    Abs Immature Granulocytes 0.10 (*)    All other components within normal limits  LACTIC ACID, PLASMA  POC OCCULT BLOOD, ED    EKG EKG Interpretation  Date/Time:  Saturday December 24 2019 12:32:49 EST Ventricular Rate:  91 PR Interval:    QRS Duration: 119 QT Interval:  375 QTC Calculation: 462 R Axis:   3 Text Interpretation: Sinus rhythm Probable left atrial enlargement Incomplete left bundle branch block Borderline low voltage, extremity leads Baseline wander in lead(s) V6 Confirmed by Davonna Belling [phone]) on 12/24/2019 1:34:42 PM   Radiology DG Chest Portable 1 View  Result Date: 12/24/2019 CLINICAL DATA:  Cough and right-sided chest pain, history of lung carcinoma EXAM: PORTABLE CHEST 1 VIEW COMPARISON:  12/09/2019 FINDINGS: Cardiac shadow is enlarged but stable. Postsurgical changes are again noted. Right chest wall port is seen. Pleural thickening and scarring in the right base is again seen and stable.  No acute bony abnormality noted. IMPRESSION: Chronic changes in the right base.  No acute abnormality is seen. Electronically Signed   By: Inez Catalina M.D.   On: 12/24/2019 13:59    Procedures Procedures (including critical care time)  Medications Ordered in ED Medications  sodium chloride 0.9 % bolus 500 mL (500 mLs Intravenous New Bag/Given 12/24/19 1424)    ED Course  I have reviewed the triage vital signs and the nursing notes.  Pertinent labs &  imaging results that were available during my care of the patient were reviewed by me and considered in my medical decision making (see chart for details).    MDM Rules/Calculators/A&P                      Patient presents with right-sided chest pain and cough.  Has had worsening over the last few days but has had cough since April.  Has a known pleural effusion on the side.  Blood pressure however has been low.  Found to be anemic.  Patient is on chemotherapy.  With the hypotension and the anemia feels the patient would benefit from admission to hospital.  Patient is guaiac negative. Final Clinical Impression(s) / ED Diagnoses Final diagnoses:  Anemia, unspecified type  Right-sided chest pain  Mesothelioma [facility])    Rx / [place] Orders ED Discharge Orders    None       Davonna Belling, MD 12/24/19 1527

## 2019-12-24 NOTE — Progress Notes (Signed)
MD paged about hypotension. Awaiting response/orders.

## 2019-12-24 NOTE — ED Triage Notes (Signed)
EMS reports pt has lung cancer and has had nonproductive cough since April.  Reports for the past 3 days has had pain in neck and below shoulder blades.  Reports pain is worse with movement.  VS per ems 106/66 hr, 100, o2 sat 99% on 3 liters.  Pt on home o2 at 2 liters.  EMs started 20G iv in left arm.  Pt has port a cath.  Pt had chemo last 3 weeks ago.

## 2019-12-25 DIAGNOSIS — E039 Hypothyroidism, unspecified: Secondary | ICD-10-CM | POA: Diagnosis present

## 2019-12-25 DIAGNOSIS — M159 Polyosteoarthritis, unspecified: Secondary | ICD-10-CM | POA: Diagnosis present

## 2019-12-25 DIAGNOSIS — D649 Anemia, unspecified: Secondary | ICD-10-CM

## 2019-12-25 DIAGNOSIS — I350 Nonrheumatic aortic (valve) stenosis: Secondary | ICD-10-CM | POA: Diagnosis present

## 2019-12-25 DIAGNOSIS — M069 Rheumatoid arthritis, unspecified: Secondary | ICD-10-CM | POA: Diagnosis present

## 2019-12-25 DIAGNOSIS — I255 Ischemic cardiomyopathy: Secondary | ICD-10-CM | POA: Diagnosis present

## 2019-12-25 DIAGNOSIS — G4730 Sleep apnea, unspecified: Secondary | ICD-10-CM | POA: Diagnosis present

## 2019-12-25 DIAGNOSIS — D6481 Anemia due to antineoplastic chemotherapy: Secondary | ICD-10-CM | POA: Diagnosis present

## 2019-12-25 DIAGNOSIS — I25810 Atherosclerosis of coronary artery bypass graft(s) without angina pectoris: Secondary | ICD-10-CM | POA: Diagnosis present

## 2019-12-25 DIAGNOSIS — Z66 Do not resuscitate: Secondary | ICD-10-CM | POA: Diagnosis present

## 2019-12-25 DIAGNOSIS — Z7901 Long term (current) use of anticoagulants: Secondary | ICD-10-CM | POA: Diagnosis not present

## 2019-12-25 DIAGNOSIS — Z9981 Dependence on supplemental oxygen: Secondary | ICD-10-CM | POA: Diagnosis not present

## 2019-12-25 DIAGNOSIS — I10 Essential (primary) hypertension: Secondary | ICD-10-CM | POA: Diagnosis present

## 2019-12-25 DIAGNOSIS — J449 Chronic obstructive pulmonary disease, unspecified: Secondary | ICD-10-CM | POA: Diagnosis present

## 2019-12-25 DIAGNOSIS — K219 Gastro-esophageal reflux disease without esophagitis: Secondary | ICD-10-CM | POA: Diagnosis present

## 2019-12-25 DIAGNOSIS — Z20822 Contact with and (suspected) exposure to covid-19: Secondary | ICD-10-CM | POA: Diagnosis present

## 2019-12-25 DIAGNOSIS — Z86718 Personal history of other venous thrombosis and embolism: Secondary | ICD-10-CM | POA: Diagnosis not present

## 2019-12-25 DIAGNOSIS — I252 Old myocardial infarction: Secondary | ICD-10-CM | POA: Diagnosis not present

## 2019-12-25 DIAGNOSIS — K449 Diaphragmatic hernia without obstruction or gangrene: Secondary | ICD-10-CM | POA: Diagnosis present

## 2019-12-25 DIAGNOSIS — C459 Mesothelioma, unspecified: Secondary | ICD-10-CM | POA: Diagnosis present

## 2019-12-25 DIAGNOSIS — E7800 Pure hypercholesterolemia, unspecified: Secondary | ICD-10-CM | POA: Diagnosis present

## 2019-12-25 DIAGNOSIS — I2510 Atherosclerotic heart disease of native coronary artery without angina pectoris: Secondary | ICD-10-CM | POA: Diagnosis present

## 2019-12-25 DIAGNOSIS — D693 Immune thrombocytopenic purpura: Secondary | ICD-10-CM | POA: Diagnosis present

## 2019-12-25 DIAGNOSIS — C450 Mesothelioma of pleura: Secondary | ICD-10-CM | POA: Diagnosis present

## 2019-12-25 DIAGNOSIS — Z86711 Personal history of pulmonary embolism: Secondary | ICD-10-CM | POA: Diagnosis not present

## 2019-12-25 DIAGNOSIS — Z974 Presence of external hearing-aid: Secondary | ICD-10-CM | POA: Diagnosis not present

## 2019-12-25 LAB — CBC
HCT: 24.1 % — ABNORMAL LOW (ref 39.0–52.0)
Hemoglobin: 7.8 g/dL — ABNORMAL LOW (ref 13.0–17.0)
MCH: 30.4 pg (ref 26.0–34.0)
MCHC: 32.4 g/dL (ref 30.0–36.0)
MCV: 93.8 fL (ref 80.0–100.0)
Platelets: 141 10*3/uL — ABNORMAL LOW (ref 150–400)
RBC: 2.57 MIL/uL — ABNORMAL LOW (ref 4.22–5.81)
RDW: 21 % — ABNORMAL HIGH (ref 11.5–15.5)
WBC: 12.2 10*3/uL — ABNORMAL HIGH (ref 4.0–10.5)
nRBC: 0 % (ref 0.0–0.2)

## 2019-12-25 LAB — HEMOGLOBIN AND HEMATOCRIT, BLOOD
HCT: 23.4 % — ABNORMAL LOW (ref 39.0–52.0)
Hemoglobin: 7.4 g/dL — ABNORMAL LOW (ref 13.0–17.0)

## 2019-12-25 LAB — PREPARE RBC (CROSSMATCH)

## 2019-12-25 MED ORDER — ORAL CARE MOUTH RINSE
15.0000 mL | Freq: Two times a day (BID) | OROMUCOSAL | Status: DC
  Administered 2019-12-26 – 2019-12-29 (×6): 15 mL via OROMUCOSAL

## 2019-12-25 MED ORDER — FUROSEMIDE 10 MG/ML IJ SOLN
20.0000 mg | Freq: Once | INTRAMUSCULAR | Status: AC
  Administered 2019-12-25: 20 mg via INTRAVENOUS
  Filled 2019-12-25: qty 2

## 2019-12-25 MED ORDER — SODIUM CHLORIDE 0.9% IV SOLUTION: Freq: Once | INTRAVENOUS | Status: AC

## 2019-12-25 MED ORDER — RIVAROXABAN 15 MG PO TABS
15.0000 mg | ORAL_TABLET | Freq: Every day | ORAL | Status: DC
  Administered 2019-12-25: 15 mg via ORAL
  Filled 2019-12-25: qty 1

## 2019-12-25 NOTE — Progress Notes (Signed)
Patient Demographics:    Jeffrey [name], is a 86 y.o. male, DOB - 04-Sep-1934, HEN:[id number]  Admit date - 12/24/2019   Admitting Physician Truett Mainland, DO  Outpatient Primary MD for the patient is [name], MD  LOS - 0   Chief Complaint  Patient presents with  . Cough  . Back Pain        Subjective:    Jeffrey [name] today has no fevers, no emesis,  No chest pain,   -Wife at bedside, fatigue and shortness of breath persist,  dyspnea on exertion and dizziness--   Assessment  & Plan :    Principal Problem:   Symptomatic anemia Active Problems:   CAD (coronary artery disease) of artery bypass graft   Malignant mesothelioma of pleura (HCC)   History of pulmonary embolism   COPD (chronic obstructive pulmonary disease) (HCC)   Antineoplastic chemotherapy induced anemia  Brief Summary:- 86 y.o. male with a history of CAD with NSTEMI in 2011 and status post CABG x3 vessels, mild aortic stenosis on echo in 2014, HTN, hypothyroidism, mesothelioma of the pleura, chemotherapy-induced anemia-admitted on 12/23/2018 with symptomatic anemia with hemoglobin down to 6.2 -Patient has history of antibodies and difficult to match blood products with prior adverse/allergic reaction to transfusion --He has been waiting to get the irradiated blood from [facility] -Goal is to keep hemoglobin close to 9 given history of CAD  A/p 1) symptomatic acute on chronic anemia--- patient with fatigue, dyspnea on exertion and dizziness--blood pressures remain soft with a systolic BP in the 36R to 90s--patient needs additional transfusion of 1 unit due to very symptomatic anemia -admission hemoglobin 6.2 --Goal is to keep hemoglobin close to 9 given history of CAD -Check stool occult blood -Suspect worsening anemia is chemotherapy induced -Continue supplemental oxygen  2)CAD----chest pain-free, continue  aspirin, no evidence of active bleeding at this time, try to keep hemoglobin close to 9  3)H/o PE----continue Xarelto especially given underlying malignancy patient high risk for further clots/VTE. -Watch for bleeding  4) mesothelioma of the pleura--ongoing follow-up with oncology  5) COPD--no acute exacerbation at this time continue bronchodilators  Disposition/Need for in-Hospital Stay- patient unable to be discharged at this time due to --symptomatic anemia requiring transfusion---blood pressures remain soft with a systolic BP in the 44R to 90s--patient needs additional transfusion of 1 unit due to very symptomatic anemia   Code Status : DNR  Family Communication:    (patient is alert, awake and coherent) Discussed with wife at bedside  Disposition Plan  : Pending physical therapy exam/eval  Consults  :  na DVT Prophylaxis  : Xarelto- SCDs   Lab Results  Component Value Date   PLT 141 (L) 12/25/2019    Inpatient Medications  Scheduled Meds: . aspirin EC  81 mg Oral Daily  . Chlorhexidine Gluconate Cloth  6 each Topical Q0600  . folic acid  1 mg Oral Daily  . [START ON 12/26/2019] levothyroxine  200 mcg Oral Q MTWThF  . mirtazapine  15 mg Oral QHS  . mometasone-formoterol  2 puff Inhalation BID  . pantoprazole  40 mg Oral Daily  . rivaroxaban  15 mg Oral Q supper   Continuous Infusions: PRN Meds:.albuterol, ALPRAZolam, HYDROcodone-homatropine, ipratropium-albuterol, ondansetron **OR**  ondansetron (ZOFRAN) IV, prochlorperazine    Anti-infectives (From admission, onward)   None        Objective:   Vitals:   12/25/19 1300 12/25/19 1539 12/25/19 1602 12/25/19 1659  BP: (!) 83/56 (!) 93/58 91/61   Pulse: 79 78 81 89  Resp: 18 20 20 15   Temp:  97.8 F (36.6 C) 98.7 F (37.1 C) 98.2 F (36.8 C)  TempSrc:  Oral Oral Oral  SpO2: 99% 98% 100% 99%  Weight:      Height:        Wt Readings from Last 3 Encounters:  12/25/19 86.6 kg  12/14/19 85.5 kg  11/22/19  86.5 kg     Intake/Output Summary (Last 24 hours) at 12/25/2019 1822 Last data filed at 12/25/2019 1539 Gross per 24 hour  Intake 2241 ml  Output 1000 ml  Net 1241 ml     Physical Exam  Gen:- Awake Alert, chronically ill-appearing HEENT:- [place].AT, No sclera icterus Nose- [place] 3L/min Neck-Supple Neck,No JVD,.  Lungs-diminished in bases right more than left, no wheezing CV- S1, S2 normal, regular, prior CABG scar, right-sided Port-A-Cath Abd-  +ve B.Sounds, Abd Soft, No tenderness,    Extremity/Skin:- No  edema, pedal pulses present  Psych-affect is appropriate, oriented x3 Neuro-generalized weakness, no new focal deficits, no tremors   Data Review:   Micro Results Recent Results (from the past 240 hour(s))  Respiratory Panel by RT PCR (Flu A&B, Covid) - Nasopharyngeal Swab     Status: None   Collection Time: 12/24/19  3:29 PM   Specimen: Nasopharyngeal Swab  Result Value Ref Range Status   SARS Coronavirus 2 by RT PCR NEGATIVE NEGATIVE Final    Comment: (NOTE) SARS-CoV-2 target nucleic acids are NOT DETECTED. The SARS-CoV-2 RNA is generally detectable in upper respiratoy specimens during the acute phase of infection. The lowest concentration of SARS-CoV-2 viral copies this assay can detect is 131 copies/mL. A negative result does not preclude SARS-Cov-2 infection and should not be used as the sole basis for treatment or other patient management decisions. A negative result may occur with  improper specimen collection/handling, submission of specimen other than nasopharyngeal swab, presence of viral mutation(s) within the areas targeted by this assay, and inadequate number of viral copies (<131 copies/mL). A negative result must be combined with clinical observations, patient history, and epidemiological information. The expected result is Negative. Fact Sheet for Patients:  PinkCheek.be Fact Sheet for Healthcare Providers:    GravelBags.it This test is not yet ap proved or cleared by the [place] FDA and  has been authorized for detection and/or diagnosis of SARS-CoV-2 by FDA under an Emergency Use Authorization (EUA). This EUA will remain  in effect (meaning this test can be used) for the duration of the COVID-19 declaration under Section 564(b)(1) of the Act, 21 U.S.C. section 360bbb-3(b)(1), unless the authorization is terminated or revoked sooner.    Influenza A by PCR NEGATIVE NEGATIVE Final   Influenza B by PCR NEGATIVE NEGATIVE Final    Comment: (NOTE) The Xpert Xpress SARS-CoV-2/FLU/RSV assay is intended as an aid in  the diagnosis of influenza from Nasopharyngeal swab specimens and  should not be used as a sole basis for treatment. Nasal washings and  aspirates are unacceptable for Xpert Xpress SARS-CoV-2/FLU/RSV  testing. Fact Sheet for Patients: PinkCheek.be Fact Sheet for Healthcare Providers: GravelBags.it This test is not yet approved or cleared by the [place] FDA and  has been authorized for detection and/or diagnosis of SARS-CoV-2 by  FDA under an Emergency Use Authorization (EUA). This EUA will remain  in effect (meaning this test can be used) for the duration of the  Covid-19 declaration under Section 564(b)(1) of the Act, 21  U.S.C. section 360bbb-3(b)(1), unless the authorization is  terminated or revoked. Performed at [facility], [address] [place], [place] [address]   MRSA PCR Screening     Status: None   Collection Time: 12/24/19  6:22 PM   Specimen: Nasal Mucosa; Nasopharyngeal  Result Value Ref Range Status   MRSA by PCR NEGATIVE NEGATIVE Final    Comment:        The GeneXpert MRSA Assay (FDA approved for NASAL specimens only), is one component of a comprehensive MRSA colonization surveillance program. It is not intended to diagnose MRSA infection nor to guide  or monitor treatment for MRSA infections. Performed at [facility], [address] [place], [place] [address]     Radiology Reports CT Chest W Contrast  Result Date: 12/24/2019 CLINICAL DATA:  Pneumonia, right-sided chest pain, cough, fatigue, mesothelioma EXAM: CT CHEST WITH CONTRAST TECHNIQUE: Multidetector CT imaging of the chest was performed during intravenous contrast administration. CONTRAST:  9mL OMNIPAQUE IOHEXOL 300 MG/ML  SOLN COMPARISON:  CT chest, 12/09/2019 FINDINGS: Cardiovascular: Right chest port catheter. Severe, irregular aortic atherosclerosis. Aortic valve calcifications. Normal heart size. Extensive 3 vessel coronary artery calcifications and/or stents, status post CABG. No pericardial effusion. Mediastinum/Nodes: Redemonstrated enlarged mediastinal lymph nodes, largest left paratracheal/AP window node measuring 1.7 cm. Thyroid gland, trachea, and esophagus demonstrate no significant findings. Lungs/Pleura: Small, loculated appearing right pleural effusion with extensive associated pleural thickening and nodularity, unchanged compared to recent prior CT. There are unchanged loculations of air within the right pleural effusion, of uncertain nature given persistence. Bibasilar atelectasis or scarring. Upper Abdomen: No acute abnormality. Musculoskeletal: No chest wall mass or suspicious bone lesions identified. IMPRESSION: 1. Small, loculated appearing right pleural effusion with extensive associated pleural thickening and nodularity, unchanged compared to recent prior CT. Findings are in keeping with known mesothelioma. 2. There are unchanged loculations of air within the right pleural effusion, of uncertain nature given persistence. A pleural drainage catheter was previously present in the right hemithorax. 3. Stable mediastinal lymphadenopathy. 4. Coronary artery disease.  Aortic Atherosclerosis (ICD10-I70.0). Electronically Signed   By: Eddie Candle M.D.   On: 12/24/2019 17:59    CT Chest W Contrast  Result Date: 12/09/2019 CLINICAL DATA:  Malignant pleural mesothelioma. Status post induction chemotherapy. EXAM: CT CHEST WITH CONTRAST TECHNIQUE: Multidetector CT imaging of the chest was performed during intravenous contrast administration. CONTRAST:  23mL OMNIPAQUE IOHEXOL 300 MG/ML  SOLN COMPARISON:  09/12/2019 FINDINGS: Cardiovascular: The heart is normal in size. No pericardial effusion. Stable tortuosity and calcification of the thoracic aorta. No dissection. Stable advanced three-vessel coronary artery calcifications. Mediastinum/Nodes: Stable 16 mm AP window lymph node. 8 mm right hilar lymph node previously measured 13 mm. 9.5 mm subcarinal lymph node previously measured 13 mm. Lungs/Pleura: Stable emphysematous changes and pulmonary scarring with peripheral nodularity and interstitial thickening. Persistent complex right pleural effusion which contains gas and could be related to recent thoracentesis or PleurX drainage catheter. Stable appearing mild nodular enhancement around the pleura. Persistent overlying atelectasis and pulmonary scarring. No left-sided pleural effusion or pleural thickening. 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 Upper Abdomen: No significant upper abdominal findings. Stable right renal cyst. Musculoskeletal: No chest wall mass, supraclavicular or axillary adenopathy. Small scattered lymph nodes are stable. IMPRESSION: 1.  Stable complex loculated right pleural fluid collection and mild enhancing nodularity. Gas in the pleural effusion may be due to a recent thoracentesis or recent PleurX drainage catheter removal. 2. 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 3. Interval decrease in size of the mediastinal and hilar lymph nodes. 4. 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 5. Stable severe atherosclerotic calcifications  involving the aorta and coronary arteries. Aortic Atherosclerosis (ICD10-I70.0) and Emphysema (ICD10-J43.9). Electronically Signed   By: Marijo Sanes M.D.   On: 12/09/2019 15:50   DG Chest Portable 1 View  Result Date: 12/24/2019 CLINICAL DATA:  Cough and right-sided chest pain, history of lung carcinoma EXAM: PORTABLE CHEST 1 VIEW COMPARISON:  12/09/2019 FINDINGS: Cardiac shadow is enlarged but stable. Postsurgical changes are again noted. Right chest wall port is seen. Pleural thickening and scarring in the right base is again seen and stable. No acute bony abnormality noted. IMPRESSION: Chronic changes in the right base.  No acute abnormality is seen. Electronically Signed   By: Inez Catalina M.D.   On: 12/24/2019 13:59     CBC Recent Labs  Lab 12/24/19 1424 12/25/19 0239 12/25/19 1042  WBC 12.7*  --  12.2*  HGB 6.2* 7.4* 7.8*  HCT 20.2* 23.4* 24.1*  PLT 146*  --  141*  MCV 100.0  --  93.8  MCH 30.7  --  30.4  MCHC 30.7  --  32.4  RDW 20.2*  --  21.0*  LYMPHSABS 1.5  --   --   MONOABS 2.2*  --   --   EOSABS 0.1  --   --   BASOSABS 0.0  --   --     Chemistries  Recent Labs  Lab 12/24/19 1424  NA 136  K 4.6  CL 103  CO2 23  GLUCOSE 109*  BUN 24*  CREATININE 1.35*  CALCIUM 8.1*  AST 21  ALT 15  ALKPHOS 72  BILITOT 0.8   ------------------------------------------------------------------------------------------------------------------ No results for input(s): CHOL, HDL, LDLCALC, TRIG, CHOLHDL, LDLDIRECT in the last 72 hours.  No results found for: HGBA1C ------------------------------------------------------------------------------------------------------------------ No results for input(s): TSH, T4TOTAL, T3FREE, THYROIDAB in the last 72 hours.  Invalid input(s): FREET3 ------------------------------------------------------------------------------------------------------------------ No results for input(s): VITAMINB12, FOLATE, FERRITIN, TIBC, IRON, RETICCTPCT in the  last 72 hours.  Coagulation profile No results for input(s): INR, PROTIME in the last 168 hours.  No results for input(s): DDIMER in the last 72 hours.  Cardiac Enzymes No results for input(s): CKMB, TROPONINI, MYOGLOBIN in the last 168 hours.  Invalid input(s): CK ------------------------------------------------------------------------------------------------------------------    Component Value Date/Time   BNP 646.0 (H) 08/19/2019 1801     Roxan Hockey M.D on 12/25/2019 at 6:22 PM  Go to [email or web address] - for contact info  [facility] - Office  [phone]

## 2019-12-25 NOTE — Progress Notes (Signed)
!   More unit of RBCs ordered. Due to patient's history of blood transfusion reaction, blood must be irradiated. Blood back contacted and process began to ensure blood is sent from location in [place] as first 2 units were on 12/24/19.

## 2019-12-26 LAB — BPAM RBC
Blood Product Expiration Date: 202101212359
Blood Product Expiration Date: 202101262359
Blood Product Expiration Date: 202101302359
ISSUE DATE / TIME: 202101022020
ISSUE DATE / TIME: 202101022320
ISSUE DATE / TIME: 202101031533
Unit Type and Rh: 5100
Unit Type and Rh: 5100
Unit Type and Rh: 5100

## 2019-12-26 LAB — TYPE AND SCREEN
ABO/RH(D): A POS
Antibody Screen: NEGATIVE
Unit division: 0
Unit division: 0
Unit division: 0

## 2019-12-26 LAB — CBC
HCT: 26.8 % — ABNORMAL LOW (ref 39.0–52.0)
Hemoglobin: 8.7 g/dL — ABNORMAL LOW (ref 13.0–17.0)
MCH: 30 pg (ref 26.0–34.0)
MCHC: 32.5 g/dL (ref 30.0–36.0)
MCV: 92.4 fL (ref 80.0–100.0)
Platelets: 137 10*3/uL — ABNORMAL LOW (ref 150–400)
RBC: 2.9 MIL/uL — ABNORMAL LOW (ref 4.22–5.81)
RDW: 18.9 % — ABNORMAL HIGH (ref 11.5–15.5)
WBC: 11.8 10*3/uL — ABNORMAL HIGH (ref 4.0–10.5)
nRBC: 0 % (ref 0.0–0.2)

## 2019-12-26 LAB — BASIC METABOLIC PANEL
Anion gap: 9 (ref 5–15)
BUN: 18 mg/dL (ref 8–23)
CO2: 26 mmol/L (ref 22–32)
Calcium: 8 mg/dL — ABNORMAL LOW (ref 8.9–10.3)
Chloride: 102 mmol/L (ref 98–111)
Creatinine, Ser: 1.17 mg/dL (ref 0.61–1.24)
GFR calc Af Amer: >60 mL/min (ref >60)
GFR calc non Af Amer: 57 mL/min — ABNORMAL LOW (ref >60)
Glucose, Bld: 98 mg/dL (ref 70–99)
Potassium: 3.5 mmol/L (ref 3.5–5.1)
Sodium: 137 mmol/L (ref 135–145)

## 2019-12-26 MED ORDER — DOCUSATE SODIUM 100 MG PO CAPS
200.0000 mg | ORAL_CAPSULE | Freq: Every day | ORAL | Status: DC
  Administered 2019-12-26: 200 mg via ORAL
  Filled 2019-12-26: qty 2

## 2019-12-26 MED ORDER — MIDODRINE HCL 5 MG PO TABS
5.0000 mg | ORAL_TABLET | Freq: Three times a day (TID) | ORAL | Status: DC
  Administered 2019-12-26 – 2019-12-29 (×10): 5 mg via ORAL
  Filled 2019-12-26 (×10): qty 1

## 2019-12-26 MED ORDER — BISACODYL 5 MG PO TBEC
5.0000 mg | DELAYED_RELEASE_TABLET | Freq: Every day | ORAL | Status: DC | PRN
  Administered 2019-12-26 – 2019-12-29 (×2): 5 mg via ORAL
  Filled 2019-12-26 (×2): qty 1

## 2019-12-26 MED ORDER — ALBUTEROL SULFATE (2.5 MG/3ML) 0.083% IN NEBU
2.5000 mg | INHALATION_SOLUTION | Freq: Once | RESPIRATORY_TRACT | Status: AC
  Administered 2019-12-26: 2.5 mg via RESPIRATORY_TRACT
  Filled 2019-12-26: qty 3

## 2019-12-26 MED ORDER — GUAIFENESIN ER 600 MG PO TB12
600.0000 mg | ORAL_TABLET | Freq: Two times a day (BID) | ORAL | Status: DC
  Administered 2019-12-26 – 2019-12-29 (×7): 600 mg via ORAL
  Filled 2019-12-26 (×7): qty 1

## 2019-12-26 MED ORDER — POTASSIUM CHLORIDE CRYS ER 20 MEQ PO TBCR
40.0000 meq | EXTENDED_RELEASE_TABLET | Freq: Once | ORAL | Status: AC
  Administered 2019-12-26: 40 meq via ORAL
  Filled 2019-12-26: qty 2

## 2019-12-26 MED ORDER — RIVAROXABAN 20 MG PO TABS
20.0000 mg | ORAL_TABLET | Freq: Every day | ORAL | Status: DC
  Administered 2019-12-26 – 2019-12-28 (×3): 20 mg via ORAL
  Filled 2019-12-26 (×3): qty 1

## 2019-12-26 NOTE — Evaluation (Signed)
Physical Therapy Evaluation Patient Details Name: Jeffrey [name] MRN: [id number] DOB: 07-Mar-1934 Today's Date: 12/26/2019   History of Present Illness  Jeffrey [name] is a 86 y.o. male with a history of coronary artery disease with NSTEMI in 2011 and status post CABG x3 vessels, mild aortic stenosis on echo in 2014, hypertension, hypothyroidism, mesothelioma of the pleura, chemotherapy-induced anemia.  Back in 2014, the patient had a transfusion reaction and was instructed to always receive blood products that have been irradiated.  Patient comes in fatigued over the past couple of weeks.  Fatigue is worse with exertion and ambulation and improved with rest.  No other palliating or provoking factors.  Denies weight loss, weight gain.  He does have a chronic cough with pleurisy.  This appears to be chronic and has been going on for several months now.    Clinical Impression  Pt admitted with above diagnosis. Pt continues to have low BP, 85/58 upon arrival, but pt denies dizziness or lightheadedness and requesting to get to bedside chair; RN reports MAP reading appropriate. Pt deconditioned, requiring min/mod assist with mobility and HHA. Spouse present at EOS, reports pt's sons are available to assist pt into the home and feels they can care for him at home. Pt currently with functional limitations due to the deficits listed below (see PT Problem List). Pt will benefit from skilled PT to increase their independence and safety with mobility to allow discharge to the venue listed below.       Follow Up Recommendations SNF(vs HHPT pending progress)    Equipment Recommendations  3in1 (PT)    Recommendations for Other Services OT consult     Precautions / Restrictions Precautions Precautions: Fall Precaution Comments: O2 baseline, monitor BP Restrictions Weight Bearing Restrictions: No      Mobility  Bed Mobility Overal bed mobility: Needs Assistance Bed Mobility: Supine to Sit     Supine  to sit: Min assist     General bed mobility comments: use of bedrail, pulls on therapists hands to achieve upright sitting EOB  Transfers Overall transfer level: Needs assistance Equipment used: 1 person hand held assist Transfers: Sit to/from [facility] Sit to Stand: Mod assist Stand pivot transfers: Mod assist       General transfer comment: maintains posterior weight shift in heels despite verbal cues, prefers bil HHA over RW and performs safely  Ambulation/Gait Ambulation/Gait assistance: Mod assist Gait Distance (Feet): 2 Feet Assistive device: 1 person hand held assist Gait Pattern/deviations: Shuffle Gait velocity: decreased   General Gait Details: limited to a few, slow steps at bedside with bil HHA versus RW due to pt preference, maintains posterior weight shift into heels despite verbal cues, limited bil knee flexion  Stairs            Wheelchair Mobility    Modified Rankin (Stroke Patients Only)       Balance Overall balance assessment: Needs assistance Sitting-balance support: Feet supported;No upper extremity supported Sitting balance-Leahy Scale: Good Sitting balance - Comments: seated EOB   Standing balance support: During functional activity;Bilateral upper extremity supported Standing balance-Leahy Scale: Poor Standing balance comment: bil HHA, weight-shift posterior into heels despite verbal cues                  Pertinent Vitals/Pain Pain Assessment: No/denies pain    Home Living Family/patient expects to be discharged to:: Private residence Living Arrangements: Spouse/significant other Available Help at Discharge: Family;Available 24 hours/day Type of Home: House Home Access:  Stairs to enter Entrance Stairs-Rails: Right;Left;Can reach both Entrance Stairs-Number of Steps: 5 Home Layout: Two level;Able to live on main level with bedroom/bathroom Home Equipment: Walker - 4 wheels;Cane - single point(rollator)       Prior Function Level of Independence: Independent with assistive device(s);Needs assistance   Gait / Transfers Assistance Needed: Pt reports uses rollator or SPC for household ambulation. Pt reports spouse occasionally assists him out of bed by pulling on her hands.  ADL's / Homemaking Assistance Needed: Pt reports spouse assists him with dressing and bathing, and spouse completes household chores  Comments: Pt reports still driving, prefers SPC, spouse to assist as needed     Hand Dominance        Extremity/Trunk Assessment   Upper Extremity Assessment Upper Extremity Assessment: Overall WFL for tasks assessed    Lower Extremity Assessment Lower Extremity Assessment: Generalized weakness;RLE deficits/detail(denies numbness/tingling bil) RLE Deficits / Details: knee flexion AROM <50%    Cervical / Trunk Assessment Cervical / Trunk Assessment: Normal  Communication   Communication: No difficulties  Cognition Arousal/Alertness: Awake/alert Behavior During Therapy: WFL for tasks assessed/performed Overall Cognitive Status: Within Functional Limits for tasks assessed             General Comments General comments (skin integrity, edema, etc.): O2 sat >98% with mobility on 2LPM    Exercises     Assessment/Plan    PT Assessment Patient needs continued PT services  PT Problem List Decreased strength;Decreased range of motion;Decreased activity tolerance;Decreased balance;Decreased mobility;Cardiopulmonary status limiting activity       PT Treatment Interventions DME instruction;Gait training;Stair training;Functional mobility training;Therapeutic activities;Therapeutic exercise;Balance training;Neuromuscular re-education;Patient/family education;Manual techniques    PT Goals (Current goals can be found in the Care Plan section)  Acute Rehab PT Goals Patient Stated Goal: return home, but open to rehab if needed PT Goal Formulation: With patient Time For Goal  Achievement: 01/09/20 Potential to Achieve Goals: Good    Frequency Min 3X/week   Barriers to discharge        Co-evaluation               AM-PAC PT "6 Clicks" Mobility  Outcome Measure Help needed turning from your back to your side while in a flat bed without using bedrails?: A Little Help needed moving from lying on your back to sitting on the side of a flat bed without using bedrails?: A Little Help needed moving to and from a bed to a chair (including a wheelchair)?: A Little Help needed standing up from a chair using your arms (e.g., wheelchair or bedside chair)?: A Little Help needed to walk in hospital room?: A Lot Help needed climbing 3-5 steps with a railing? : Total 6 Click Score: 15    End of Session Equipment Utilized During Treatment: Gait belt;Oxygen Activity Tolerance: Patient tolerated treatment well Patient left: in chair;with call bell/phone within reach;with nursing/sitter in room Nurse Communication: Mobility status PT Visit Diagnosis: Unsteadiness on feet (R26.81);Other abnormalities of gait and mobility (R26.89);Muscle weakness (generalized) (M62.81)    Time: 1000-1029 PT Time Calculation (min) (ACUTE ONLY): 29 min   Charges:   PT Evaluation $PT Eval Moderate Complexity: 1 Mod PT Treatments $Therapeutic Activity: 8-22 mins        Tori Sania Noy PT, DPT 12/26/19, 10:52 AM [phone]

## 2019-12-26 NOTE — Plan of Care (Signed)
  Problem: Acute Rehab PT Goals(only PT should resolve) Goal: Pt Will Go Supine/Side To Sit Outcome: Progressing Flowsheets (Taken 12/26/2019 1053) Pt will go Supine/Side to Sit: with min guard assist Goal: Pt Will Go Sit To Supine/Side Outcome: Progressing Flowsheets (Taken 12/26/2019 1053) Pt will go Sit to Supine/Side: with min guard assist Goal: Patient Will Transfer Sit To/From Stand Outcome: Progressing Flowsheets (Taken 12/26/2019 1053) Patient will transfer sit to/from stand: with minimal assist Goal: Pt Will Transfer Bed To Chair/Chair To Bed Outcome: Progressing Flowsheets (Taken 12/26/2019 1053) Pt will Transfer Bed to Chair/Chair to Bed: with min assist Goal: Pt Will Ambulate Outcome: Progressing Flowsheets (Taken 12/26/2019 1053) Pt will Ambulate:  25 feet  with minimal assist  with least restrictive assistive device   Tori Kutter Schnepf PT, DPT 12/26/19, 10:54 AM [phone]

## 2019-12-26 NOTE — NC FL2 (Deleted)
[place] LEVEL OF CARE SCREENING TOOL     IDENTIFICATION  Patient Name: Jeffrey [name] Birthdate: 01-19-1934 Sex: male Admission Date (Current Location): 12/24/2019  [facility] and [place] Number:  [employer] and Address:         Provider Number: [id number]  Attending Physician Name and Address:  Roxan Hockey, MD  Relative Name and Phone Number:  [name] [phone]    Current Level of Care: Hospital Recommended Level of Care: [place] Prior Approval Number:    Date Approved/Denied:   PASRR Number:    Discharge Plan: SNF    Current Diagnoses: Patient Active Problem List   Diagnosis Date Noted  . Symptomatic anemia 12/24/2019  . Antineoplastic chemotherapy induced anemia 09/13/2019  . Neutropenia ([place]) 09/06/2019  . COPD (chronic obstructive pulmonary disease) ([place]) 08/23/2019  . Thrush 08/23/2019  . History of pulmonary embolism 08/05/2019  . Acute lower UTI 08/05/2019  . Pulmonary embolism ([place]) 08/05/2019  . Malignant mesothelioma of pleura ([place]) 06/16/2019  . Goals of care, counseling/discussion 06/16/2019  . Encounter for antineoplastic chemotherapy 06/16/2019  . NSTEMI (non-ST elevated myocardial infarction) ([place]) 06/01/2019  . Shortness of breath   . Coronary artery disease due to lipid rich plaque   . Elevated troponin   . Coronary artery disease involving native coronary artery of native heart with unstable angina pectoris ([place])   . Pleural effusion on right 05/14/2019  . Pancreatic lesion 05/14/2019  . PD (Parkinson's disease) ([place]) 12/02/2018  . Primary osteoarthritis of right hip 06/29/2018  . Primary osteoarthritis of left knee 11/24/2014  . Primary osteoarthritis of knee 11/24/2014  . Bacterial infection of knee joint ([place]) 08/17/2014  . Septic joint of left knee joint ([place]) 06/22/2014  . CAP (community acquired pneumonia) 01/26/2014  . CAD (coronary artery disease) of artery bypass graft  01/26/2014  . Hypothyroidism 01/26/2014  . Thrombocytopenia, unspecified ([place]) 01/26/2014  . Nausea alone 01/26/2014  . Effusion of knee joint, left 10/11/2012  . OA (osteoarthritis) of knee 10/11/2012  . HIP PAIN 11/15/2007  . 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 11/15/2007    Orientation RESPIRATION BLADDER Height & Weight     Self, Time, Situation, Place  O2(2 lpm [place]) Continent Weight: 86 kg Height:  6\' 1"  (185.4 cm)  BEHAVIORAL SYMPTOMS/MOOD NEUROLOGICAL BOWEL NUTRITION STATUS  (N/A) (N/A) Continent Diet(heart healthy)  AMBULATORY STATUS COMMUNICATION OF NEEDS Skin   Limited Assist Verbally Other (Comment)(ECCHYMOSIS TO ARMS/LEGS BILATERALLY; SKIN TEARS TO ARMS/LEGS BILATERALLY)                       Personal Care Assistance Level of Assistance  Bathing, Dressing Bathing Assistance: Limited assistance   Dressing Assistance: Limited assistance     Functional Limitations Info  Sight, Speech, Hearing Sight Info: Impaired Hearing Info: Adequate Speech Info: Adequate    SPECIAL CARE FACTORS FREQUENCY  PT (By licensed PT)     PT Frequency: 3x week              Contractures Contractures Info: Not present    Additional Factors Info  Code Status, Allergies Code Status Info: DNR Allergies Info: lipitor, methylprednisolone, morphine and related, blood infusions must be treated with radiation, sulfa antibiotics, ciproflozacin, doxycycline           Current Medications (12/26/2019):  This is the current hospital active medication list Current Facility-Administered Medications  Medication Dose Route Frequency Provider Last Rate Last Admin  . albuterol (PROVENTIL) (2.5  MG/3ML) 0.083% nebulizer solution 3 mL  3 mL Inhalation BID PRN Truett Mainland, DO      . ALPRAZolam Duanne Moron) tablet 0.25 mg  0.25 mg Oral BID PRN Truett Mainland, DO   0.25 mg at 12/25/19 2001  . aspirin EC tablet 81 mg  81 mg Oral Daily Truett Mainland, DO   81 mg at 12/26/19 1028  .  Chlorhexidine Gluconate Cloth 2 % PADS 6 each  6 each Topical Q0600 Truett Mainland, DO   6 each at 12/26/19 0602  . folic acid (FOLVITE) tablet 1 mg  1 mg Oral Daily Truett Mainland, DO   1 mg at 12/26/19 1028  . guaiFENesin (MUCINEX) 12 hr tablet 600 mg  600 mg Oral BID Denton Brick, Courage, MD   600 mg at 12/26/19 1221  . HYDROcodone-homatropine (HYCODAN) 5-1.5 MG/5ML syrup 5 mL  5 mL Oral Q6H PRN Truett Mainland, DO   5 mL at 12/26/19 1950  . ipratropium-albuterol (DUONEB) 0.5-2.5 (3) MG/3ML nebulizer solution 3 mL  3 mL Nebulization Q6H PRN Truett Mainland, DO   3 mL at 12/26/19 0020  . levothyroxine (SYNTHROID) tablet 200 mcg  200 mcg Oral Q MTWThF Truett Mainland, DO   200 mcg at 12/26/19 0602  . MEDLINE mouth rinse  15 mL Mouth Rinse BID Emokpae, Courage, MD   15 mL at 12/26/19 1029  . midodrine (PROAMATINE) tablet 5 mg  5 mg Oral TID WC Emokpae, Courage, MD   5 mg at 12/26/19 1720  . mirtazapine (REMERON) tablet 15 mg  15 mg Oral QHS Truett Mainland, DO   15 mg at 12/25/19 2117  . mometasone-formoterol (DULERA) 100-5 MCG/ACT inhaler 2 puff  2 puff Inhalation BID Truett Mainland, DO   2 puff at 12/26/19 9326  . ondansetron (ZOFRAN) tablet 4 mg  4 mg Oral Q6H PRN Truett Mainland, DO       Or  . ondansetron [facility]) injection 4 mg  4 mg Intravenous Q6H PRN Truett Mainland, DO      . pantoprazole (PROTONIX) EC tablet 40 mg  40 mg Oral Daily Truett Mainland, DO   40 mg at 12/26/19 1028  . prochlorperazine (COMPAZINE) tablet 10 mg  10 mg Oral Q6H PRN Truett Mainland, DO      . rivaroxaban (XARELTO) tablet 20 mg  20 mg Oral Q supper Roxan Hockey, MD   20 mg at 12/26/19 1720     Discharge Medications: Please see discharge summary for a list of discharge medications.  Relevant Imaging Results:  Relevant Lab Results:   Additional Information ss# [id number]  Sherald Barge, RN

## 2019-12-26 NOTE — TOC Progression Note (Signed)
Transition of Care [facility]) - Progression Note    Patient Details  Name: BARNET BENAVIDES MRN: [id number] Date of Birth: 10/17/1934  Transition of Care [facility] CM/SW Contact  Roda Shutters Margretta Sidle, RN Phone Number: 12/26/2019, 5:44 PM  Clinical Narrative:     Pt recommended for SNF. Facility options discussed with family by [facility] Team. Family prefers facilities in [place]. Referrals faxed.

## 2019-12-26 NOTE — Progress Notes (Signed)
Patient Demographics:    Jeffrey [name], is a 86 y.o. male, DOB - 10/31/1934, KJZ:[id number]  Admit date - 12/24/2019   Admitting Physician Regan Llorente Denton Brick, MD  Outpatient Primary MD for the patient is [name], MD  LOS - 1   Chief Complaint  Patient presents with   Cough   Back Pain        Subjective:    Jeffrey [name] today has no fevers, no emesis,  No chest pain,    -Wife at bedside, questions answered -Difficulty ambulating with physical therapist due to fatigue, dyspnea on exertion and dizziness-   Assessment  & Plan :    Principal Problem:   Symptomatic anemia Active Problems:   History of pulmonary embolism   CAD (coronary artery disease) of artery bypass graft   Malignant mesothelioma of pleura (HCC)   COPD (chronic obstructive pulmonary disease) ([place])   Antineoplastic chemotherapy induced anemia  Brief Summary:- 86 y.o. male with a history of CAD with NSTEMI in 2011 and status post CABG x3 vessels, mild aortic stenosis on echo in 2014, HTN, hypothyroidism, mesothelioma of the pleura, chemotherapy-induced anemia-admitted on 12/23/2018 with symptomatic anemia with hemoglobin down to 6.2 -Patient has history of antibodies and difficult to match blood products with prior adverse/allergic reaction to transfusion --He has been waiting to get the irradiated blood [facility] -Goal is to keep hemoglobin close to 9 given history of CAD -Awaiting transfer to SNF rehab  A/p 1) symptomatic acute on chronic anemia---  -admission hemoglobin 6.2, hemoglobin up to 8.7 after 3 units of packed cells --Goal is to keep hemoglobin close to 9 given history of CAD -Check stool occult blood -Suspect worsening anemia is chemotherapy induced -Continue supplemental oxygen - fatigue, dyspnea on exertion and dizziness slightly improved, but not resolved after  transfusion   2)CAD----chest pain-free, continue aspirin, no evidence of active bleeding at this time, try to keep hemoglobin close to 9 -Continue aspirin,   3)H/o PE----continue Xarelto especially given underlying malignancy patient high risk for further clots/VTE. -Watch for bleeding  4) mesothelioma of the pleura--ongoing follow-up with oncology  5) COPD--no acute exacerbation at this time continue bronchodilators -Supplemental oxygen as ordered  6) generalized weakness/deconditioning/soft BP/orthostatic dizziness--- it is making it difficult for patient to ambulate and participate in therapy, start midodrine 5 mg 3 times daily -Physical therapy evaluation appreciated recommend SNF rehab,  -patient and wife agreeable to transfer to SNF rehab when bed available    Disposition/Need for in-Hospital Stay- patient unable to be discharged at this time due to --- physical therapy recommends SNF rehab -Awaiting transfer to SNF rehab  Code Status : DNR  Family Communication:    (patient is alert, awake and coherent) Discussed with wife at bedside  Disposition Plan  : Awaiting transfer to SNF rehab when bed available Consults  :  na DVT Prophylaxis  : Xarelto- SCDs   Lab Results  Component Value Date   PLT 137 (L) 12/26/2019    Inpatient Medications  Scheduled Meds:  aspirin EC  81 mg Oral Daily   Chlorhexidine Gluconate Cloth  6 each Topical X4801   folic acid  1 mg Oral Daily   levothyroxine  200 mcg Oral Q MTWThF   mouth  rinse  15 mL Mouth Rinse BID   midodrine  5 mg Oral TID WC   mirtazapine  15 mg Oral QHS   mometasone-formoterol  2 puff Inhalation BID   pantoprazole  40 mg Oral Daily   rivaroxaban  20 mg Oral Q supper   Continuous Infusions: PRN Meds:.albuterol, ALPRAZolam, HYDROcodone-homatropine, ipratropium-albuterol, ondansetron **OR** ondansetron (ZOFRAN) IV, prochlorperazine    Anti-infectives (From admission, onward)   None         Objective:   Vitals:   12/26/19 0800 12/26/19 0900 12/26/19 1000 12/26/19 1100  BP: 98/62 91/65 (!) 85/58 102/66  Pulse: 79 72 72 89  Resp: 20 18 (!) 21 (!) 21  Temp:      TempSrc:      SpO2: 98% 98% 100% 100%  Weight:      Height:        Wt Readings from Last 3 Encounters:  12/26/19 86 kg  12/14/19 85.5 kg  11/22/19 86.5 kg     Intake/Output Summary (Last 24 hours) at 12/26/2019 1126 Last data filed at 12/26/2019 0300 Gross per 24 hour  Intake 897 ml  Output 1750 ml  Net -853 ml     Physical Exam  Gen:- Awake Alert, chronically ill-appearing HEENT:- [place].AT, No sclera icterus Nose- [place] 3L/min Neck-Supple Neck,No JVD,.  Lungs-diminished in bases right more than left, no wheezing CV- S1, S2 normal, regular, prior CABG scar, right-sided Port-A-Cath Abd-  +ve B.Sounds, Abd Soft, No tenderness,    Extremity/Skin:- No  edema, pedal pulses present  Psych-affect is appropriate, oriented x3 Neuro-generalized weakness, no new focal deficits, no tremors   Data Review:   Micro Results Recent Results (from the past 240 hour(s))  Respiratory Panel by RT PCR (Flu A&B, Covid) - Nasopharyngeal Swab     Status: None   Collection Time: 12/24/19  3:29 PM   Specimen: Nasopharyngeal Swab  Result Value Ref Range Status   SARS Coronavirus 2 by RT PCR NEGATIVE NEGATIVE Final    Comment: (NOTE) SARS-CoV-2 target nucleic acids are NOT DETECTED. The SARS-CoV-2 RNA is generally detectable in upper respiratoy specimens during the acute phase of infection. The lowest concentration of SARS-CoV-2 viral copies this assay can detect is 131 copies/mL. A negative result does not preclude SARS-Cov-2 infection and should not be used as the sole basis for treatment or other patient management decisions. A negative result may occur with  improper specimen collection/handling, submission of specimen other than nasopharyngeal swab, presence of viral mutation(s) within the areas targeted by this assay,  and inadequate number of viral copies (<131 copies/mL). A negative result must be combined with clinical observations, patient history, and epidemiological information. The expected result is Negative. Fact Sheet for Patients:  PinkCheek.be Fact Sheet for Healthcare Providers:  GravelBags.it This test is not yet ap proved or cleared by the [place] FDA and  has been authorized for detection and/or diagnosis of SARS-CoV-2 by FDA under an Emergency Use Authorization (EUA). This EUA will remain  in effect (meaning this test can be used) for the duration of the COVID-19 declaration under Section 564(b)(1) of the Act, 21 U.S.C. section 360bbb-3(b)(1), unless the authorization is terminated or revoked sooner.    Influenza A by PCR NEGATIVE NEGATIVE Final   Influenza B by PCR NEGATIVE NEGATIVE Final    Comment: (NOTE) The Xpert Xpress SARS-CoV-2/FLU/RSV assay is intended as an aid in  the diagnosis of influenza from Nasopharyngeal swab specimens and  should not be used as a sole basis  for treatment. Nasal washings and  aspirates are unacceptable for Xpert Xpress SARS-CoV-2/FLU/RSV  testing. Fact Sheet for Patients: PinkCheek.be Fact Sheet for Healthcare Providers: GravelBags.it This test is not yet approved or cleared by the [place] FDA and  has been authorized for detection and/or diagnosis of SARS-CoV-2 by  FDA under an Emergency Use Authorization (EUA). This EUA will remain  in effect (meaning this test can be used) for the duration of the  Covid-19 declaration under Section 564(b)(1) of the Act, 21  U.S.C. section 360bbb-3(b)(1), unless the authorization is  terminated or revoked. Performed at [facility], [address] [place], [place] [address]   MRSA PCR Screening     Status: None   Collection Time: 12/24/19  6:22 PM   Specimen: Nasal Mucosa;  Nasopharyngeal  Result Value Ref Range Status   MRSA by PCR NEGATIVE NEGATIVE Final    Comment:        The GeneXpert MRSA Assay (FDA approved for NASAL specimens only), is one component of a comprehensive MRSA colonization surveillance program. It is not intended to diagnose MRSA infection nor to guide or monitor treatment for MRSA infections. Performed at [facility], [address], [place], [place] [address]     Radiology Reports CT Chest W Contrast  Result Date: 12/24/2019 CLINICAL DATA:  Pneumonia, right-sided chest pain, cough, fatigue, mesothelioma EXAM: CT CHEST WITH CONTRAST TECHNIQUE: Multidetector CT imaging of the chest was performed during intravenous contrast administration. CONTRAST:  39mL OMNIPAQUE IOHEXOL 300 MG/ML  SOLN COMPARISON:  CT chest, 12/09/2019 FINDINGS: Cardiovascular: Right chest port catheter. Severe, irregular aortic atherosclerosis. Aortic valve calcifications. Normal heart size. Extensive 3 vessel coronary artery calcifications and/or stents, status post CABG. No pericardial effusion. Mediastinum/Nodes: Redemonstrated enlarged mediastinal lymph nodes, largest left paratracheal/AP window node measuring 1.7 cm. Thyroid gland, trachea, and esophagus demonstrate no significant findings. Lungs/Pleura: Small, loculated appearing right pleural effusion with extensive associated pleural thickening and nodularity, unchanged compared to recent prior CT. There are unchanged loculations of air within the right pleural effusion, of uncertain nature given persistence. Bibasilar atelectasis or scarring. Upper Abdomen: No acute abnormality. Musculoskeletal: No chest wall mass or suspicious bone lesions identified. IMPRESSION: 1. Small, loculated appearing right pleural effusion with extensive associated pleural thickening and nodularity, unchanged compared to recent prior CT. Findings are in keeping with known mesothelioma. 2. There are unchanged loculations of air within the  right pleural effusion, of uncertain nature given persistence. A pleural drainage catheter was previously present in the right hemithorax. 3. Stable mediastinal lymphadenopathy. 4. Coronary artery disease.  Aortic Atherosclerosis (ICD10-I70.0). Electronically Signed   By: Eddie Candle M.D.   On: 12/24/2019 17:59   CT Chest W Contrast  Result Date: 12/09/2019 CLINICAL DATA:  Malignant pleural mesothelioma. Status post induction chemotherapy. EXAM: CT CHEST WITH CONTRAST TECHNIQUE: Multidetector CT imaging of the chest was performed during intravenous contrast administration. CONTRAST:  47mL OMNIPAQUE IOHEXOL 300 MG/ML  SOLN COMPARISON:  09/12/2019 FINDINGS: Cardiovascular: The heart is normal in size. No pericardial effusion. Stable tortuosity and calcification of the thoracic aorta. No dissection. Stable advanced three-vessel coronary artery calcifications. Mediastinum/Nodes: Stable 16 mm AP window lymph node. 8 mm right hilar lymph node previously measured 13 mm. 9.5 mm subcarinal lymph node previously measured 13 mm. Lungs/Pleura: Stable emphysematous changes and pulmonary scarring with peripheral nodularity and interstitial thickening. Persistent complex right pleural effusion which contains gas and could be related to recent thoracentesis or PleurX drainage catheter. Stable appearing mild nodular enhancement around the pleura.  Persistent overlying atelectasis and pulmonary scarring. No left-sided pleural effusion or pleural thickening. 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 Upper Abdomen: No significant upper abdominal findings. Stable right renal cyst. Musculoskeletal: No chest wall mass, supraclavicular or axillary adenopathy. Small scattered lymph nodes are stable. IMPRESSION: 1. Stable complex loculated right pleural fluid collection and mild enhancing nodularity. Gas in the pleural effusion may be due to a recent thoracentesis or recent PleurX  drainage catheter removal. 2. 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 3. Interval decrease in size of the mediastinal and hilar lymph nodes. 4. 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 5. Stable severe atherosclerotic calcifications involving the aorta and coronary arteries. Aortic Atherosclerosis (ICD10-I70.0) and Emphysema (ICD10-J43.9). Electronically Signed   By: Marijo Sanes M.D.   On: 12/09/2019 15:50   DG Chest Portable 1 View  Result Date: 12/24/2019 CLINICAL DATA:  Cough and right-sided chest pain, history of lung carcinoma EXAM: PORTABLE CHEST 1 VIEW COMPARISON:  12/09/2019 FINDINGS: Cardiac shadow is enlarged but stable. Postsurgical changes are again noted. Right chest wall port is seen. Pleural thickening and scarring in the right base is again seen and stable. No acute bony abnormality noted. IMPRESSION: Chronic changes in the right base.  No acute abnormality is seen. Electronically Signed   By: Inez Catalina M.D.   On: 12/24/2019 13:59     CBC Recent Labs  Lab 12/24/19 1424 12/25/19 0239 12/25/19 1042 12/26/19 0516  WBC 12.7*  --  12.2* 11.8*  HGB 6.2* 7.4* 7.8* 8.7*  HCT 20.2* 23.4* 24.1* 26.8*  PLT 146*  --  141* 137*  MCV 100.0  --  93.8 92.4  MCH 30.7  --  30.4 30.0  MCHC 30.7  --  32.4 32.5  RDW 20.2*  --  21.0* 18.9*  LYMPHSABS 1.5  --   --   --   MONOABS 2.2*  --   --   --   EOSABS 0.1  --   --   --   BASOSABS 0.0  --   --   --     Chemistries  Recent Labs  Lab 12/24/19 1424 12/26/19 0516  NA 136 137  K 4.6 3.5  CL 103 102  CO2 23 26  GLUCOSE 109* 98  BUN 24* 18  CREATININE 1.35* 1.17  CALCIUM 8.1* 8.0*  AST 21  --   ALT 15  --   ALKPHOS 72  --   BILITOT 0.8  --    ------------------------------------------------------------------------------------------------------------------ No results for input(s): CHOL, HDL, LDLCALC, TRIG, CHOLHDL, LDLDIRECT in the last 72  hours.  No results found for: HGBA1C ------------------------------------------------------------------------------------------------------------------ No results for input(s): TSH, T4TOTAL, T3FREE, THYROIDAB in the last 72 hours.  Invalid input(s): FREET3 ------------------------------------------------------------------------------------------------------------------ No results for input(s): VITAMINB12, FOLATE, FERRITIN, TIBC, IRON, RETICCTPCT in the last 72 hours.  Coagulation profile No results for input(s): INR, PROTIME in the last 168 hours.  No results for input(s): DDIMER in the last 72 hours.  Cardiac Enzymes No results for input(s): CKMB, TROPONINI, MYOGLOBIN in the last 168 hours.  Invalid input(s): CK ------------------------------------------------------------------------------------------------------------------    Component Value Date/Time   BNP 646.0 (H) 08/19/2019 1801     Roxan Hockey M.D on 12/26/2019 at 11:26 AM  Go to [email or web address] - for contact info  [facility] - Office  [phone]

## 2019-12-26 NOTE — NC FL2 (Signed)
[place] LEVEL OF CARE SCREENING TOOL     IDENTIFICATION  Patient Name: Jeffrey [name] Birthdate: [birth year] Sex: male Admission Date (Current Location): 12/24/2019  [facility] and [place] Number:  [employer] and Address:         Provider Number: [id number]  Attending Physician Name and Address:  Roxan Hockey, MD  Relative Name and Phone Number:  Jeffrey [name] [phone]    Current Level of Care: Hospital Recommended Level of Care: [place] Prior Approval Number:    Date Approved/Denied:   PASRR Number:    Discharge Plan: SNF    Current Diagnoses: Patient Active Problem List   Diagnosis Date Noted  . Symptomatic anemia 12/24/2019  . Antineoplastic chemotherapy induced anemia 09/13/2019  . Neutropenia ([place]) 09/06/2019  . COPD (chronic obstructive pulmonary disease) ([place]) 08/23/2019  . Thrush 08/23/2019  . History of pulmonary embolism 08/05/2019  . Acute lower UTI 08/05/2019  . Pulmonary embolism ([place]) 08/05/2019  . Malignant mesothelioma of pleura ([place]) 06/16/2019  . Goals of care, counseling/discussion 06/16/2019  . Encounter for antineoplastic chemotherapy 06/16/2019  . NSTEMI (non-ST elevated myocardial infarction) ([place]) 06/01/2019  . Shortness of breath   . Coronary artery disease due to lipid rich plaque   . Elevated troponin   . Coronary artery disease involving native coronary artery of native heart with unstable angina pectoris ([place])   . Pleural effusion on right 05/14/2019  . Pancreatic lesion 05/14/2019  . PD (Parkinson's disease) ([place]) 12/02/2018  . Primary osteoarthritis of right hip 06/29/2018  . Primary osteoarthritis of left knee 11/24/2014  . Primary osteoarthritis of knee 11/24/2014  . Bacterial infection of knee joint ([place]) 08/17/2014  . Septic joint of left knee joint ([place]) 06/22/2014  . CAP (community acquired pneumonia) 01/26/2014  . CAD (coronary artery disease) of artery bypass graft  01/26/2014  . Hypothyroidism 01/26/2014  . Thrombocytopenia, unspecified ([place]) 01/26/2014  . Nausea alone 01/26/2014  . Effusion of knee joint, left 10/11/2012  . OA (osteoarthritis) of knee 10/11/2012  . HIP PAIN 11/15/2007  . 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 11/15/2007    Orientation RESPIRATION BLADDER Height & Weight     Self, Time, Situation, Place  O2(2 lpm [place]) Continent Weight: 86 kg Height:  6\' 1"  (185.4 cm)  BEHAVIORAL SYMPTOMS/MOOD NEUROLOGICAL BOWEL NUTRITION STATUS  (N/A) (N/A) Continent Diet(heart healthy)  AMBULATORY STATUS COMMUNICATION OF NEEDS Skin   Limited Assist Verbally Other (Comment)(ECCHYMOSIS TO ARMS/LEGS BILATERALLY; SKIN TEARS TO ARMS/LEGS BILATERALLY)                       Personal Care Assistance Level of Assistance  Bathing, Dressing Bathing Assistance: Limited assistance   Dressing Assistance: Limited assistance     Functional Limitations Info  Sight, Speech, Hearing Sight Info: Impaired Hearing Info: Adequate Speech Info: Adequate    SPECIAL CARE FACTORS FREQUENCY  PT (By licensed PT)     PT Frequency: 3x week              Contractures Contractures Info: Not present    Additional Factors Info  Code Status, Allergies Code Status Info: DNR Allergies Info: lipitor, methylprednisolone, morphine and related, blood infusions must be treated with radiation, sulfa antibiotics, ciproflozacin, doxycycline           Current Medications (12/26/2019):  This is the current hospital active medication list Current Facility-Administered Medications  Medication Dose Route Frequency Provider Last Rate Last Admin  . albuterol (PROVENTIL) (2.5  MG/3ML) 0.083% nebulizer solution 3 mL  3 mL Inhalation BID PRN Truett Mainland, DO      . ALPRAZolam Duanne Moron) tablet 0.25 mg  0.25 mg Oral BID PRN Truett Mainland, DO   0.25 mg at 12/25/19 2001  . aspirin EC tablet 81 mg  81 mg Oral Daily Truett Mainland, DO   81 mg at 12/26/19 1028  .  Chlorhexidine Gluconate Cloth 2 % PADS 6 each  6 each Topical Q0600 Truett Mainland, DO   6 each at 12/26/19 0602  . folic acid (FOLVITE) tablet 1 mg  1 mg Oral Daily Truett Mainland, DO   1 mg at 12/26/19 1028  . guaiFENesin (MUCINEX) 12 hr tablet 600 mg  600 mg Oral BID Denton Brick, Courage, MD   600 mg at 12/26/19 1221  . HYDROcodone-homatropine (HYCODAN) 5-1.5 MG/5ML syrup 5 mL  5 mL Oral Q6H PRN Truett Mainland, DO   5 mL at 12/26/19 5284  . ipratropium-albuterol (DUONEB) 0.5-2.5 (3) MG/3ML nebulizer solution 3 mL  3 mL Nebulization Q6H PRN Truett Mainland, DO   3 mL at 12/26/19 0020  . levothyroxine (SYNTHROID) tablet 200 mcg  200 mcg Oral Q MTWThF Truett Mainland, DO   200 mcg at 12/26/19 0602  . MEDLINE mouth rinse  15 mL Mouth Rinse BID Emokpae, Courage, MD   15 mL at 12/26/19 1029  . midodrine (PROAMATINE) tablet 5 mg  5 mg Oral TID WC Emokpae, Courage, MD   5 mg at 12/26/19 1720  . mirtazapine (REMERON) tablet 15 mg  15 mg Oral QHS Truett Mainland, DO   15 mg at 12/25/19 2117  . mometasone-formoterol (DULERA) 100-5 MCG/ACT inhaler 2 puff  2 puff Inhalation BID Truett Mainland, DO   2 puff at 12/26/19 1324  . ondansetron (ZOFRAN) tablet 4 mg  4 mg Oral Q6H PRN Truett Mainland, DO       Or  . ondansetron [facility]) injection 4 mg  4 mg Intravenous Q6H PRN Truett Mainland, DO      . pantoprazole (PROTONIX) EC tablet 40 mg  40 mg Oral Daily Truett Mainland, DO   40 mg at 12/26/19 1028  . prochlorperazine (COMPAZINE) tablet 10 mg  10 mg Oral Q6H PRN Truett Mainland, DO      . rivaroxaban (XARELTO) tablet 20 mg  20 mg Oral Q supper Roxan Hockey, MD   20 mg at 12/26/19 1720     Discharge Medications: Please see discharge summary for a list of discharge medications.  Relevant Imaging Results:  Relevant Lab Results:   Additional Information ss# [id number]  Sherald Barge, RN

## 2019-12-27 LAB — CBC
HCT: 30 % — ABNORMAL LOW (ref 39.0–52.0)
Hemoglobin: 9.4 g/dL — ABNORMAL LOW (ref 13.0–17.0)
MCH: 29.4 pg (ref 26.0–34.0)
MCHC: 31.3 g/dL (ref 30.0–36.0)
MCV: 93.8 fL (ref 80.0–100.0)
Platelets: 159 10*3/uL (ref 150–400)
RBC: 3.2 MIL/uL — ABNORMAL LOW (ref 4.22–5.81)
RDW: 18.6 % — ABNORMAL HIGH (ref 11.5–15.5)
WBC: 12.4 10*3/uL — ABNORMAL HIGH (ref 4.0–10.5)
nRBC: 0 % (ref 0.0–0.2)

## 2019-12-27 MED ORDER — OXYCODONE HCL 5 MG PO TABS
5.0000 mg | ORAL_TABLET | Freq: Four times a day (QID) | ORAL | Status: DC | PRN
  Administered 2019-12-27 – 2019-12-28 (×2): 5 mg via ORAL
  Filled 2019-12-27 (×2): qty 1

## 2019-12-27 MED ORDER — SENNOSIDES-DOCUSATE SODIUM 8.6-50 MG PO TABS
2.0000 | ORAL_TABLET | Freq: Two times a day (BID) | ORAL | Status: DC
  Administered 2019-12-27 – 2019-12-29 (×4): 2 via ORAL
  Filled 2019-12-27 (×4): qty 2

## 2019-12-27 MED ORDER — TRAMADOL HCL 50 MG PO TABS: 50.0000 mg | ORAL_TABLET | Freq: Two times a day (BID) | ORAL | Status: DC | PRN

## 2019-12-27 NOTE — Progress Notes (Signed)
Patient Demographics:    Jeffrey [name], is a 86 y.o. male, DOB - [birth year], ZHY:[id number]  Admit date - 12/24/2019   Admitting Physician Wasif Simonich Denton Brick, MD  Outpatient Primary MD for the patient is [name], MD  LOS - 2   Chief Complaint  Patient presents with  . Cough  . Back Pain        Subjective:    Jeffrey [name] today has no fevers, no emesis,  No chest pain,    -Patient continues to require assist of 2 to ambulate -Has significant fatigue and tremors with unsteady gait   Assessment  & Plan :    Principal Problem:   Symptomatic anemia Active Problems:   History of pulmonary embolism   CAD (coronary artery disease) of artery bypass graft   Malignant mesothelioma of pleura (HCC)   COPD (chronic obstructive pulmonary disease) (HCC)   Antineoplastic chemotherapy induced anemia  Brief Summary:- 86 y.o. male with a history of CAD with NSTEMI in 2011 and status post CABG x3 vessels, mild aortic stenosis on echo in 2014, HTN, hypothyroidism, mesothelioma of the pleura, chemotherapy-induced anemia-admitted on 12/23/2018 with symptomatic anemia with hemoglobin down to 6.2 -Patient has history of antibodies and difficult to match blood products with prior adverse/allergic reaction to transfusion --He has been waiting to get the irradiated blood from [facility] -Goal is to keep hemoglobin close to 9 given history of CAD - SNF apparently does not have a bed until  12/28/2019  -transfer to [facility] and rehab SNF on 12/27/2018 (repeat Covid test negative on 12/27/2019)  A/p 1)Symptomatic acute on chronic anemia---  -admission hemoglobin 6.2, hemoglobin up to 9.4 after 3 units of packed cells --Goal is to keep hemoglobin close to 9 given history of CAD -stool occult blood requested -Suspect worsening anemia is chemotherapy induced -Continue supplemental oxygen - fatigue, dyspnea  on exertion and dizziness slightly improved, but not resolved after transfusion   2)CAD----chest pain-free, continue aspirin, no evidence of active bleeding at this time, try to keep hemoglobin close to 9 -Continue aspirin,   3)H/o PE----continue Xarelto especially given underlying malignancy patient high risk for further clots/VTE. -Watch for bleeding  4)Mesothelioma of the pleura--ongoing follow-up with oncology  5) COPD--no acute exacerbation at this time continue bronchodilators Ruthe Mannan) -Supplemental oxygen as ordered  6)Generalized weakness/Deconditioning/soft BP/orthostatic dizziness--- it is making it difficult for patient to ambulate and participate in therapy, c/n Midodrine 5 mg 3 times daily for BP support -Physical therapy evaluation appreciated recommend SNF rehab,  -patient and wife agreeable to transfer to SNF rehab when bed available    Disposition/Need for in-Hospital Stay-  SNF apparently does not have a bed until 12/28/2019  -transfer to [facility] and rehab SNF on 12/27/2018 (repeat Covid test negative on 12/27/2019)-   Code Status : DNR  Family Communication:    (patient is alert, awake and coherent) Discussed with wife at bedside  Disposition Plan  : Awaiting transfer to SNF rehab when bed available Consults  :  na DVT Prophylaxis  : Xarelto- SCDs   Lab Results  Component Value Date   PLT 159 12/27/2019    Inpatient Medications  Scheduled Meds: . aspirin EC  81 mg Oral Daily  . Chlorhexidine Gluconate  Cloth  6 each Topical Q0600  . docusate sodium  200 mg Oral QHS  . folic acid  1 mg Oral Daily  . guaiFENesin  600 mg Oral BID  . levothyroxine  200 mcg Oral Q MTWThF  . mouth rinse  15 mL Mouth Rinse BID  . midodrine  5 mg Oral TID WC  . mirtazapine  15 mg Oral QHS  . mometasone-formoterol  2 puff Inhalation BID  . pantoprazole  40 mg Oral Daily  . rivaroxaban  20 mg Oral Q supper   Continuous Infusions: PRN Meds:.albuterol, ALPRAZolam,  bisacodyl, HYDROcodone-homatropine, ipratropium-albuterol, ondansetron **OR** ondansetron (ZOFRAN) IV, prochlorperazine    Anti-infectives (From admission, onward)   None        Objective:   Vitals:   12/27/19 0400 12/27/19 0500 12/27/19 0600 12/27/19 0700  BP: 118/63 (!) 98/57 98/81 109/65  Pulse: 94 80 88 85  Resp: (!) 23 20 18  (!) 24  Temp: 98.6 F (37 C)     TempSrc: Oral     SpO2: 94% 95% 99% 96%  Weight:  86.8 kg    Height:        Wt Readings from Last 3 Encounters:  12/27/19 86.8 kg  12/14/19 85.5 kg  11/22/19 86.5 kg     Intake/Output Summary (Last 24 hours) at 12/27/2019 0748 Last data filed at 12/27/2019 0500 Gross per 24 hour  Intake 240 ml  Output 900 ml  Net -660 ml     Physical Exam  Gen:- Awake Alert, chronically ill-appearing HEENT:- [place].AT, No sclera icterus Nose- [place] 3L/min Neck-Supple Neck,No JVD,.  Lungs-diminished in bases right more than left, no wheezing CV- S1, S2 normal, regular, prior CABG scar, right-sided Port-A-Cath Abd-  +ve B.Sounds, Abd Soft, No tenderness,    Extremity/Skin:- No  edema, pedal pulses present  Psych-affect is appropriate, oriented x3 Neuro-generalized weakness, no new focal deficits, parkinsonian tremors   Data Review:   Micro Results Recent Results (from the past 240 hour(s))  Respiratory Panel by RT PCR (Flu A&B, Covid) - Nasopharyngeal Swab     Status: None   Collection Time: 12/24/19  3:29 PM   Specimen: Nasopharyngeal Swab  Result Value Ref Range Status   SARS Coronavirus 2 by RT PCR NEGATIVE NEGATIVE Final    Comment: (NOTE) SARS-CoV-2 target nucleic acids are NOT DETECTED. The SARS-CoV-2 RNA is generally detectable in upper respiratoy specimens during the acute phase of infection. The lowest concentration of SARS-CoV-2 viral copies this assay can detect is 131 copies/mL. A negative result does not preclude SARS-Cov-2 infection and should not be used as the sole basis for treatment or other patient  management decisions. A negative result may occur with  improper specimen collection/handling, submission of specimen other than nasopharyngeal swab, presence of viral mutation(s) within the areas targeted by this assay, and inadequate number of viral copies (<131 copies/mL). A negative result must be combined with clinical observations, patient history, and epidemiological information. The expected result is Negative. Fact Sheet for Patients:  PinkCheek.be Fact Sheet for Healthcare Providers:  GravelBags.it This test is not yet ap proved or cleared by the [place] FDA and  has been authorized for detection and/or diagnosis of SARS-CoV-2 by FDA under an Emergency Use Authorization (EUA). This EUA will remain  in effect (meaning this test can be used) for the duration of the COVID-19 declaration under Section 564(b)(1) of the Act, 21 U.S.C. section 360bbb-3(b)(1), unless the authorization is terminated or revoked sooner.    Influenza A  by PCR NEGATIVE NEGATIVE Final   Influenza B by PCR NEGATIVE NEGATIVE Final    Comment: (NOTE) The Xpert Xpress SARS-CoV-2/FLU/RSV assay is intended as an aid in  the diagnosis of influenza from Nasopharyngeal swab specimens and  should not be used as a sole basis for treatment. Nasal washings and  aspirates are unacceptable for Xpert Xpress SARS-CoV-2/FLU/RSV  testing. Fact Sheet for Patients: PinkCheek.be Fact Sheet for Healthcare Providers: GravelBags.it This test is not yet approved or cleared by the [place] FDA and  has been authorized for detection and/or diagnosis of SARS-CoV-2 by  FDA under an Emergency Use Authorization (EUA). This EUA will remain  in effect (meaning this test can be used) for the duration of the  Covid-19 declaration under Section 564(b)(1) of the Act, 21  U.S.C. section 360bbb-3(b)(1), unless the  authorization is  terminated or revoked. Performed at [facility], [address] [place], [place] [address]   MRSA PCR Screening     Status: None   Collection Time: 12/24/19  6:22 PM   Specimen: Nasal Mucosa; Nasopharyngeal  Result Value Ref Range Status   MRSA by PCR NEGATIVE NEGATIVE Final    Comment:        The GeneXpert MRSA Assay (FDA approved for NASAL specimens only), is one component of a comprehensive MRSA colonization surveillance program. It is not intended to diagnose MRSA infection nor to guide or monitor treatment for MRSA infections. Performed at [facility], [address] [place], [place] [address]     Radiology Reports CT Chest W Contrast  Result Date: 12/24/2019 CLINICAL DATA:  Pneumonia, right-sided chest pain, cough, fatigue, mesothelioma EXAM: CT CHEST WITH CONTRAST TECHNIQUE: Multidetector CT imaging of the chest was performed during intravenous contrast administration. CONTRAST:  19mL OMNIPAQUE IOHEXOL 300 MG/ML  SOLN COMPARISON:  CT chest, 12/09/2019 FINDINGS: Cardiovascular: Right chest port catheter. Severe, irregular aortic atherosclerosis. Aortic valve calcifications. Normal heart size. Extensive 3 vessel coronary artery calcifications and/or stents, status post CABG. No pericardial effusion. Mediastinum/Nodes: Redemonstrated enlarged mediastinal lymph nodes, largest left paratracheal/AP window node measuring 1.7 cm. Thyroid gland, trachea, and esophagus demonstrate no significant findings. Lungs/Pleura: Small, loculated appearing right pleural effusion with extensive associated pleural thickening and nodularity, unchanged compared to recent prior CT. There are unchanged loculations of air within the right pleural effusion, of uncertain nature given persistence. Bibasilar atelectasis or scarring. Upper Abdomen: No acute abnormality. Musculoskeletal: No chest wall mass or suspicious bone lesions identified. IMPRESSION: 1. Small, loculated appearing right  pleural effusion with extensive associated pleural thickening and nodularity, unchanged compared to recent prior CT. Findings are in keeping with known mesothelioma. 2. There are unchanged loculations of air within the right pleural effusion, of uncertain nature given persistence. A pleural drainage catheter was previously present in the right hemithorax. 3. Stable mediastinal lymphadenopathy. 4. Coronary artery disease.  Aortic Atherosclerosis (ICD10-I70.0). Electronically Signed   By: Eddie Candle M.D.   On: 12/24/2019 17:59   CT Chest W Contrast  Result Date: 12/09/2019 CLINICAL DATA:  Malignant pleural mesothelioma. Status post induction chemotherapy. EXAM: CT CHEST WITH CONTRAST TECHNIQUE: Multidetector CT imaging of the chest was performed during intravenous contrast administration. CONTRAST:  44mL OMNIPAQUE IOHEXOL 300 MG/ML  SOLN COMPARISON:  09/12/2019 FINDINGS: Cardiovascular: The heart is normal in size. No pericardial effusion. Stable tortuosity and calcification of the thoracic aorta. No dissection. Stable advanced three-vessel coronary artery calcifications. Mediastinum/Nodes: Stable 16 mm AP window lymph node. 8 mm right hilar lymph node previously measured 13 mm.  9.5 mm subcarinal lymph node previously measured 13 mm. Lungs/Pleura: Stable emphysematous changes and pulmonary scarring with peripheral nodularity and interstitial thickening. Persistent complex right pleural effusion which contains gas and could be related to recent thoracentesis or PleurX drainage catheter. Stable appearing mild nodular enhancement around the pleura. Persistent overlying atelectasis and pulmonary scarring. No left-sided pleural effusion or pleural thickening. 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 Upper Abdomen: No significant upper abdominal findings. Stable right renal cyst. Musculoskeletal: No chest wall mass, supraclavicular or axillary adenopathy. Small  scattered lymph nodes are stable. IMPRESSION: 1. Stable complex loculated right pleural fluid collection and mild enhancing nodularity. Gas in the pleural effusion may be due to a recent thoracentesis or recent PleurX drainage catheter removal. 2. 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 3. Interval decrease in size of the mediastinal and hilar lymph nodes. 4. 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 5. Stable severe atherosclerotic calcifications involving the aorta and coronary arteries. Aortic Atherosclerosis (ICD10-I70.0) and Emphysema (ICD10-J43.9). Electronically Signed   By: Marijo Sanes M.D.   On: 12/09/2019 15:50   DG Chest Portable 1 View  Result Date: 12/24/2019 CLINICAL DATA:  Cough and right-sided chest pain, history of lung carcinoma EXAM: PORTABLE CHEST 1 VIEW COMPARISON:  12/09/2019 FINDINGS: Cardiac shadow is enlarged but stable. Postsurgical changes are again noted. Right chest wall port is seen. Pleural thickening and scarring in the right base is again seen and stable. No acute bony abnormality noted. IMPRESSION: Chronic changes in the right base.  No acute abnormality is seen. Electronically Signed   By: Inez Catalina M.D.   On: 12/24/2019 13:59     CBC Recent Labs  Lab 12/24/19 1424 12/25/19 0239 12/25/19 1042 12/26/19 0516 12/27/19 0404  WBC 12.7*  --  12.2* 11.8* 12.4*  HGB 6.2* 7.4* 7.8* 8.7* 9.4*  HCT 20.2* 23.4* 24.1* 26.8* 30.0*  PLT 146*  --  141* 137* 159  MCV 100.0  --  93.8 92.4 93.8  MCH 30.7  --  30.4 30.0 29.4  MCHC 30.7  --  32.4 32.5 31.3  RDW 20.2*  --  21.0* 18.9* 18.6*  LYMPHSABS 1.5  --   --   --   --   MONOABS 2.2*  --   --   --   --   EOSABS 0.1  --   --   --   --   BASOSABS 0.0  --   --   --   --     Chemistries  Recent Labs  Lab 12/24/19 1424 12/26/19 0516  NA 136 137  K 4.6 3.5  CL 103 102  CO2 23 26  GLUCOSE 109* 98  BUN 24* 18  CREATININE 1.35* 1.17    CALCIUM 8.1* 8.0*  AST 21  --   ALT 15  --   ALKPHOS 72  --   BILITOT 0.8  --    ------------------------------------------------------------------------------------------------------------------ No results for input(s): CHOL, HDL, LDLCALC, TRIG, CHOLHDL, LDLDIRECT in the last 72 hours.  No results found for: HGBA1C ------------------------------------------------------------------------------------------------------------------ No results for input(s): TSH, T4TOTAL, T3FREE, THYROIDAB in the last 72 hours.  Invalid input(s): FREET3 ------------------------------------------------------------------------------------------------------------------ No results for input(s): VITAMINB12, FOLATE, FERRITIN, TIBC, IRON, RETICCTPCT in the last 72 hours.  Coagulation profile No results for input(s): INR, PROTIME in the last 168 hours.  No results for input(s): DDIMER in the last 72 hours.  Cardiac Enzymes No results for input(s): CKMB, TROPONINI, MYOGLOBIN  in the last 168 hours.  Invalid input(s): CK ------------------------------------------------------------------------------------------------------------------    Component Value Date/Time   BNP 646.0 (H) 08/19/2019 1801    Roxan Hockey M.D on 12/27/2019 at 7:48 AM  Go to [email or web address] - for contact info  [facility] - Office  [phone]

## 2019-12-27 NOTE — Progress Notes (Signed)
Physical Therapy Treatment Patient Details Name: Jeffrey [name] MRN: [id number] DOB: 1934-01-07 Today's Date: 12/27/2019    History of Present Illness Jeffrey [name] is a 86 y.o. male with a history of coronary artery disease with NSTEMI in 2011 and status post CABG x3 vessels, mild aortic stenosis on echo in 2014, hypertension, hypothyroidism, mesothelioma of the pleura, chemotherapy-induced anemia.  Back in 2014, the patient had a transfusion reaction and was instructed to always receive blood products that have been irradiated.  Patient comes in fatigued over the past couple of weeks.  Fatigue is worse with exertion and ambulation and improved with rest.  No other palliating or provoking factors.  Denies weight loss, weight gain.  He does have a chronic cough with pleurisy.  This appears to be chronic and has been going on for several months now.    PT Comments     pt in bed and eager to work with therapy.  Requires mod assist with all transfers as relys heavily on assistance and pulling rather than pushing self up.  Pt with tendency to push backward rather than get his weight over his BOS.  Pt ambulated in room using RW approx 25 feet and set up in recliner to complete therex.  Pt rested approx 5 minutes after completing therex then requested to return to bed.  Pt required cues to complete transfer back to supine and assistance to get LE's back into bed.  Noted fatigue following session.  Follow Up Recommendations        Equipment Recommendations       Recommendations for Other Services       Precautions / Restrictions Precautions Precautions: Fall    Mobility  Bed Mobility Overal bed mobility: Needs Assistance Bed Mobility: Supine to Sit     Supine to sit: Min assist     General bed mobility comments: use of bedrail, pulls on therapists hands to achieve upright sitting EOB  Transfers Overall transfer level: Needs assistance Equipment used: Rolling walker (2  wheeled) Transfers: Sit to/from Stand Sit to Stand: Mod assist Stand pivot transfers: Mod assist       General transfer comment: maintains posterior weight shift in heels despite verbal cues, cues to push with UE rather than try to pull up on walker  Ambulation/Gait Ambulation/Gait assistance: Mod assist Gait Distance (Feet): 25 Feet(in room) Assistive device: Rolling walker (2 wheeled) Gait Pattern/deviations: Shuffle Gait velocity: decreased   General Gait Details: tends to take short shuffling steps, moving slowly with walker. Cues to take larger steps and shift weight forward with upright posturing.   Stairs             Wheelchair Mobility    Modified Rankin (Stroke Patients Only)       Balance                                            Cognition Arousal/Alertness: Awake/alert Behavior During Therapy: WFL for tasks assessed/performed Overall Cognitive Status: Within Functional Limits for tasks assessed                                 General Comments: pt did c/o RT heel pain "feels like it's sliced open"  Therapist inspected and did not see any redness, signs of pressure or open areas.  Exercises General Exercises - Lower Extremity Ankle Circles/Pumps: AAROM;Both;10 reps Long Arc Quad: AAROM;Both;10 reps Hip Flexion/Marching: AAROM;Both;10 reps Toe Raises: AROM;Both;10 reps Heel Raises: AROM;Both;10 reps    General Comments        Pertinent Vitals/Pain Pain Assessment: No/denies pain    Home Living                      Prior Function            PT Goals (current goals can now be found in the care plan section) Progress towards PT goals: Progressing toward goals    Frequency           PT Plan  continue to progress towards goals.    Co-evaluation              AM-PAC PT "6 Clicks" Mobility   Outcome Measure    Help needed moving from lying on your back to sitting on the side of a  flat bed without using bedrails?: A Little Help needed moving to and from a bed to a chair (including a wheelchair)?: A Little Help needed standing up from a chair using your arms (e.g., wheelchair or bedside chair)?: A Little Help needed to walk in hospital room?: A Lot Help needed climbing 3-5 steps with a railing? : Total 6 Click Score: 12    End of Session Equipment Utilized During Treatment: Gait belt Activity Tolerance: Patient tolerated treatment well Patient left: in bed;with call bell/phone within reach Nurse Communication: Mobility status PT Visit Diagnosis: Unsteadiness on feet (R26.81);Other abnormalities of gait and mobility (R26.89);Muscle weakness (generalized) (M62.81)     Time: 1455-1540 PT Time Calculation (min) (ACUTE ONLY): 45 min  Charges:  $Gait Training: 8-22 mins $Therapeutic Exercise: 8-22 mins $Therapeutic Activity: 8-22 mins                     Teena Irani, PTA/CLT [place], Terita Hejl B 12/27/2019, 3:31 PM

## 2019-12-27 NOTE — TOC Progression Note (Addendum)
Transition of Care [facility]) - Progression Note    Patient Details  Name: Jeffrey [name] MRN: [id number] Date of Birth: 1934-08-31  [facility] CM/SW Contact  Boneta Lucks, RN Phone Number: 12/27/2019, 2:34 PM  Clinical Narrative:  Patient has a bed offer but [name] wife is requesting [facility]. CM faxed referral. Merrilyn Puma called to make a bed offer.  CM started [employer] AUTH ref # [id number].  His last COVID was 12/24/19 and acceptable by facility. RN and MD updated, Plan for patient to discharge tomorrow. TOC to follow.   Expected Discharge Plan: [place] Barriers to Discharge: Insurance Authorization  Expected Discharge Plan and Services Expected Discharge Plan: [place] and [place] accepted [phone]   Living arrangements for the past 2 months: [place]

## 2019-12-28 ENCOUNTER — Inpatient Hospital Stay (HOSPITAL_COMMUNITY): Payer: Medicare Other

## 2019-12-28 MED ORDER — OXYCODONE HCL 5 MG PO TABS: 5.0000 mg | ORAL_TABLET | Freq: Three times a day (TID) | ORAL | 0 refills | Status: DC | PRN

## 2019-12-28 MED ORDER — ONDANSETRON HCL 4 MG PO TABS: 4.0000 mg | ORAL_TABLET | Freq: Four times a day (QID) | ORAL | 0 refills | Status: AC | PRN

## 2019-12-28 MED ORDER — ALPRAZOLAM 0.25 MG PO TABS: 0.2500 mg | ORAL_TABLET | Freq: Two times a day (BID) | ORAL | 0 refills | Status: AC | PRN

## 2019-12-28 MED ORDER — HYDROCODONE-HOMATROPINE 5-1.5 MG/5ML PO SYRP: 5.0000 mL | ORAL_SOLUTION | Freq: Three times a day (TID) | ORAL | 0 refills | Status: DC | PRN

## 2019-12-28 MED ORDER — MIDODRINE HCL 5 MG PO TABS: 5.0000 mg | ORAL_TABLET | Freq: Three times a day (TID) | ORAL | 1 refills | Status: AC

## 2019-12-28 MED ORDER — ASPIRIN EC 81 MG PO TBEC: 81.0000 mg | DELAYED_RELEASE_TABLET | Freq: Every day | ORAL | 2 refills | Status: AC

## 2019-12-28 MED ORDER — SENNOSIDES-DOCUSATE SODIUM 8.6-50 MG PO TABS: 2.0000 | ORAL_TABLET | Freq: Two times a day (BID) | ORAL | 2 refills | Status: AC

## 2019-12-28 MED ORDER — MIRTAZAPINE 15 MG PO TABS: 15.0000 mg | ORAL_TABLET | Freq: Every day | ORAL | 2 refills | Status: AC

## 2019-12-28 NOTE — Discharge Instructions (Signed)
1) please get CBC/complete blood count every Monday starting January 02, 2020 2)Avoid ibuprofen/Advil/Aleve/Motrin/Goody Powders/Naproxen/BC powders/Meloxicam/Diclofenac/Indomethacin and other Nonsteroidal anti-inflammatory medications as these will make you more likely to bleed and can cause stomach ulcers, can also cause Kidney problems.

## 2019-12-28 NOTE — Progress Notes (Signed)
CSW in contact with Merrilyn Puma, admissions coordinator at [facility]: [phone]. Merrilyn Puma explains that the insurance Josem Kaufmann has been started but that they are needing further documentation. CSW faxed Merrilyn Puma needed paper work to [employer].   Merrilyn Puma states that discharge may not occur until tomorrow depending on the auth approval. CSW requests that patient be able to transfer as soon as possible and as early as today if insurance auth returns back today.   TOC team will continue to follow patient for discharge related needs  [place] Transitions of Care  Clinical Social Worker  Ph: [phone]

## 2019-12-28 NOTE — Care Management Important Message (Signed)
Important Message  Patient Details  Name: Jeffrey [name] MRN: [id number] Date of Birth: 04-25-1934   Medicare Important Message Given:  Yes     Tommy Medal 12/28/2019, 1:41 PM

## 2019-12-28 NOTE — Discharge Summary (Addendum)
Jeffrey [name], is a 86 y.o. male  DOB Jul 01, 1934  MRN [id number].  Admission date:  12/24/2019  Admitting Physician  Keyontae Huckeby Denton Brick, MD  Discharge Date:  12/29/2019   Primary MD  [name], MD  Recommendations for primary care physician for things to follow:   1)Please get CBC/complete blood count every Monday starting January 02, 2020 2)Avoid ibuprofen/Advil/Aleve/Motrin/Goody Powders/Naproxen/BC powders/Meloxicam/Diclofenac/Indomethacin and other Nonsteroidal anti-inflammatory medications as these will make you more likely to bleed and can cause stomach ulcers, can also cause Kidney problems.  Admission Diagnosis  Mesothelioma ([place]) [C45.9] Symptomatic anemia [D64.9] Right-sided chest pain [R07.9] Anemia, unspecified type [D64.9]   Discharge Diagnosis  Mesothelioma ([place]) [C45.9] Symptomatic anemia [D64.9] Right-sided chest pain [R07.9] Anemia, unspecified type [D64.9]   Principal Problem:   Symptomatic anemia Active Problems:   History of pulmonary embolism   CAD (coronary artery disease) of artery bypass graft   Malignant mesothelioma of pleura (HCC)   COPD (chronic obstructive pulmonary disease) ([place])   Antineoplastic chemotherapy induced anemia      Past Medical History:  Diagnosis Date   Anxiety    Aortic stenosis    mild AS by 07/20/13 echo ([facility])   Arthritis    "all over"   Coronary artery disease    NSTEMI 06/2010, CABG x3=> [place]->LAD, SVG->OM1, SVG->PDA, DES LCx 10/2011, DES LAD 12/2011   Depression    GERD (gastroesophageal reflux disease)    H/O hiatal hernia    Hearing aid worn    B/L   High cholesterol    History of blood transfusion reaction Mar 19, 2013   "he almost died; he has to get irradiated blood next time"   Hypertension    Hypothyroidism    Ischemic cardiomyopathy    ITP (idiopathic thrombocytopenic purpura)    Dr. Gaynelle Arabian,  on Promacta   Myocardial infarction [facility]) 03/19/2010   Pneumonia 1940's X 1; 2014-03-19 X 1   Rheumatoid arthritis ([place])    Shortness of breath    with exertion, has not been very active   Sleep apnea    Wears glasses    Wears partial dentures     Past Surgical History:  Procedure Laterality Date   APPENDECTOMY     BACK SURGERY     CATARACT EXTRACTION, BILATERAL     CHEST TUBE INSERTION Right 05/20/2019   Procedure: INSERTION PLEURAL DRAINAGE CATHETER;  Surgeon: Ivin Poot, MD;  Location: [place];  Service: Thoracic;  Laterality: Right;   COLONOSCOPY     CORONARY ANGIOPLASTY WITH STENT PLACEMENT     DES Lcx 10/2011, DES LAD 12/2011   CORONARY ARTERY BYPASS GRAFT  March 19, 2010   "CABG X3"   CORONARY STENT INTERVENTION N/A 05/26/2019   Procedure: CORONARY STENT INTERVENTION;  Surgeon: Nelva Bush, MD;  Location: [place] [facility];  Service: Cardiovascular;  Laterality: N/A;   GASTROC RECESSION EXTREMITY Right 07/06/2015   Pasty Spillers (orthopedics- [place], [place])   HERNIA REPAIR     HIATAL HERNIA REPAIR     IR IMAGING  GUIDED PORT INSERTION  06/29/2019   KNEE ARTHROSCOPY Left 06/22/2014   w/I&D   KNEE ARTHROSCOPY Left 06/22/2014   Procedure: IRRIGATION AND DEBRIDEMENT WITH CHRONDROPLASTY;  Surgeon: Hessie Dibble, MD;  Location: [place];  Service: Orthopedics;  Laterality: Left;   LEFT HEART CATH AND CORS/GRAFTS ANGIOGRAPHY N/A 05/26/2019   Procedure: LEFT HEART CATH AND CORS/GRAFTS ANGIOGRAPHY;  Surgeon: Nelva Bush, MD;  Location: [place] [facility];  Service: Cardiovascular;  Laterality: N/A;   LUMBAR DISC SURGERY  1960's?   MULTIPLE TOOTH EXTRACTIONS     PLEURAL BIOPSY Right 05/20/2019   Procedure: PLEURAL BIOPSY;  Surgeon: Ivin Poot, MD;  Location: [place];  Service: Thoracic;  Laterality: Right;   REMOVAL OF PLEURAL DRAINAGE CATHETER Right 11/15/2019   Procedure: REMOVAL OF PLEURAL DRAINAGE CATHETER;  Surgeon: Ivin Poot, MD;  Location: [place];  Service:  Thoracic;  Laterality: Right;   SHOULDER ARTHROSCOPY Right 04/09/2017   Procedure: ARTHROSCOPY SHOULDER;  Surgeon: Melrose Nakayama, MD;  Location: [place];  Service: Orthopedics;  Laterality: Right;   STERNAL CLOSURE     "wires from OHS taken out; plate put in" (05/25/159)   SYNOVECTOMY Left 08/17/2014   Procedure: SYNOVECTOMY LEFT KNEE;  Surgeon: Hessie Dibble, MD;  Location: [place];  Service: Orthopedics;  Laterality: Left;   TOTAL ANKLE ARTHROPLASTY Right 07/06/2015   Pasty Spillers (orthopedics- [place])   TOTAL HIP ARTHROPLASTY Right 06/29/2018   Procedure: RIGHT TOTAL HIP ARTHROPLASTY ANTERIOR APPROACH;  Surgeon: Melrose Nakayama, MD;  Location: [facility];  Service: Orthopedics;  Laterality: Right;   TOTAL HIP ARTHROPLASTY Left 06/09/2016   Nicki Reaper Streater Claiborne Billings (orthopedics- [facility])   TOTAL KNEE ARTHROPLASTY Left 11/24/2014   Procedure: TOTAL KNEE ARTHROPLASTY;  Surgeon: Hessie Dibble, MD;  Location: [place];  Service: Orthopedics;  Laterality: Left;   VIDEO ASSISTED THORACOSCOPY Right 05/20/2019   Procedure: VIDEO ASSISTED THORACOSCOPY;  Surgeon: Prescott Gum, Collier Salina, MD;  Location: [place];  Service: Thoracic;  Laterality: Right;       HPI  from the history and physical done on the day of admission:    Chief Complaint: Fatigue  HPI: CLARY MEEKER is a 86 y.o. male with a history of coronary artery disease with NSTEMI in 2011 and status post CABG x3 vessels, mild aortic stenosis on echo in 2014, hypertension, hypothyroidism, mesothelioma of the pleura, chemotherapy-induced anemia.  Back in 2014, the patient had a transfusion reaction and was instructed to always receive blood products that have been irradiated.  Patient comes in fatigued over the past couple of weeks.  Fatigue is worse with exertion and ambulation and improved with rest.  No other palliating or provoking factors.  Denies weight loss, weight gain.  He does have a chronic cough with pleurisy.  This appears to be chronic and  has been going on for several months now.  Emergency Department Course: Blood work shows hemoglobin of 6.2.  Creatinine 1.35 (baseline is 1.03-1.2).  Chest x-ray without acute cardiopulmonary process.  CT chest pending   Hospital Course:   Brief Summary:- 86 y.o.malewith a history of CAD with NSTEMI in 2011 and status post CABG x3 vessels, mild aortic stenosis on echo in 2014, HTN, hypothyroidism, mesothelioma of the pleura, chemotherapy-induced anemia-admitted on 12/23/2018 with symptomatic anemia with hemoglobin down to 6.2 -Patient has history of antibodies and difficult to match blood products with prior adverse/allergic reaction to transfusion --He has been waiting to get the irradiated blood from [place]  is to keep hemoglobin close to 9 given history of CAD  -transfer to [facility] and rehab SNF on 12/27/2018 (repeat Covid test negative on 12/27/2019)  -Patient was initially discharged to SNF facility on 12/28/2019 but no bed was available so discharge was postponed until 12/29/2019  A/p 1)Symptomatic acute on chronic anemia---  -admission hemoglobin 6.2, hemoglobin up to 9.5 after 3 units of packed cells --Goal is to keep hemoglobin close to 9 given history of CAD -stool occult blood requested -Suspect worsening anemia is chemotherapy induced -Continue supplemental oxygen - fatigue, dyspnea on exertion and dizziness improving after transfusion  2)CAD----chest pain-free, continue aspirin, no evidence of active bleeding at this time, try to keep hemoglobin close to 9 -Continue aspirin,   3)H/o PE----continue Xarelto especially given underlying malignancy patient high risk for further clots/VTE. -Watch for bleeding  4)Mesothelioma of the pleura--ongoing follow-up with oncology  5) COPD--no acute exacerbation at this time continue bronchodilators -Supplemental oxygen as ordered  6)Generalized weakness/Deconditioning/soft BP/orthostatic dizziness--- it  is making it difficult for patient to ambulate and participate in therapy, c/n Midodrine 5 mg 3 times daily for BP support -Physical therapy evaluation appreciated recommend SNF rehab,  -patient and wife agreeable to transfer to SNF rehab    Disposition---transfer to [facility] and rehab SNF on 12/27/2018 (repeat Covid test negative on 12/27/2019)-   Code Status : DNR  Family Communication:    (patient is alert, awake and coherent) Discussed with wife at bedside  Disposition Plan  : -transfer to [place] and rehab SNF on 12/27/2018 (repeat Covid test negative on 12/27/2019)  --Patient was initially discharged to SNF facility on 12/28/2019 but no bed was available so discharge was postponed until 12/29/2019   Discharge Condition: Stable,  Follow UP  Contact information for after-discharge care    [facility] .   Service: Skilled Nursing Contact information: [place] [phone]              Diet and Activity recommendation:  As advised  Discharge Instructions    Discharge Instructions    Call MD for:  difficulty breathing, headache or visual disturbances   Complete by: As directed    Call MD for:  extreme fatigue   Complete by: As directed    Call MD for:  persistant dizziness or light-headedness   Complete by: As directed    Call MD for:  persistant nausea and vomiting   Complete by: As directed    Call MD for:  severe uncontrolled pain   Complete by: As directed    Call MD for:  temperature >100.4   Complete by: As directed    Diet - low sodium heart healthy   Complete by: As directed    Discharge instructions   Complete by: As directed    1) please get CBC/complete blood count every Monday starting January 02, 2020 2)Avoid ibuprofen/Advil/Aleve/Motrin/Goody Powders/Naproxen/BC powders/Meloxicam/Diclofenac/Indomethacin and other Nonsteroidal anti-inflammatory medications as  these will make you more likely to bleed and can cause stomach ulcers, can also cause Kidney problems.   Increase activity slowly   Complete by: As directed        Discharge Medications     Allergies as of 12/29/2019      Reactions   Lipitor [atorvastatin] Other (See Comments)   muscle spasms cramps   Methylprednisolone Other (See Comments)   cramps cramps   Morphine And Related    Hallucinations at  times   Other    If patient is to receive blood - blood must be treated with radiation because his body will not accept a normal infusion    Sulfa Antibiotics Itching   Ciprofloxacin Rash   Doxycycline Rash   Doxycycline caused itching redness on scalp and head Doxycycline caused itching redness on scalp and head      Medication List    STOP taking these medications   lidocaine-prilocaine cream Commonly known as: EMLA   magic mouthwash Soln   prochlorperazine 10 MG tablet Commonly known as: COMPAZINE     TAKE these medications   albuterol 108 (90 Base) MCG/ACT inhaler Commonly known as: VENTOLIN HFA Inhale 1 puff into the lungs 2 (two) times daily as needed for shortness of breath.   ALPRAZolam 0.25 MG tablet Commonly known as: XANAX Take 1 tablet (0.25 mg total) by mouth 2 (two) times daily as needed for anxiety.   aspirin EC 81 MG tablet Take 1 tablet (81 mg total) by mouth daily with breakfast. What changed: when to take this   chlorproMAZINE 25 MG tablet Commonly known as: THORAZINE TAKE 1 TABLET BY MOUTH THREE TIMES DAILY What changed:   when to take this  reasons to take this  additional instructions   dexamethasone 4 MG tablet Commonly known as: DECADRON 4 mg p.o. twice daily the day before, day of and day after chemotherapy every 3 weeks   Fluticasone-Salmeterol 100-50 MCG/DOSE Aepb Commonly known as: ADVAIR Inhale 1 puff into the lungs 2 (two) times daily.   folic acid 1 MG tablet Commonly known as: FOLVITE Take 1 tablet (1 mg total) by  mouth daily.   guaiFENesin 600 MG 12 hr tablet Commonly known as: Mucinex Take 1 tablet (600 mg total) by mouth 2 (two) times daily.   HYDROcodone-homatropine 5-1.5 MG/5ML syrup Commonly known as: HYCODAN Take 5 mLs by mouth every 8 (eight) hours as needed for cough. What changed: when to take this   ipratropium-albuterol 0.5-2.5 (3) MG/3ML Soln Commonly known as: DUONEB Take 3 mLs by nebulization every 6 (six) hours as needed. J44.9 What changed: reasons to take this   levothyroxine 200 MCG tablet Commonly known as: SYNTHROID Take 200 mcg by mouth See admin instructions. Take 200 mg tablet Monday through Friday - None on Saturday or Sunday.   midodrine 5 MG tablet Commonly known as: PROAMATINE Take 1 tablet (5 mg total) by mouth 3 (three) times daily with meals.   mirtazapine 15 MG tablet Commonly known as: REMERON Take 1 tablet (15 mg total) by mouth at bedtime. What changed: medication strength   omeprazole 40 MG capsule Commonly known as: PRILOSEC Take 40 mg by mouth at bedtime.   ondansetron 4 MG tablet Commonly known as: ZOFRAN Take 1 tablet (4 mg total) by mouth every 6 (six) hours as needed for nausea.   oxyCODONE 5 MG immediate release tablet Commonly known as: Oxy IR/ROXICODONE Take 1 tablet (5 mg total) by mouth every 8 (eight) hours as needed for severe pain.   rivaroxaban 20 MG Tabs tablet Commonly known as: XARELTO Take 20 mg by mouth daily with supper.   senna-docusate 8.6-50 MG tablet Commonly known as: Senokot-S Take 2 tablets by mouth 2 (two) times daily.      Major procedures and Radiology Reports - PLEASE review detailed and final reports for all details, in brief -   CT Chest W Contrast  Result Date: 12/24/2019 CLINICAL DATA:  Pneumonia, right-sided chest pain, cough, fatigue,  mesothelioma EXAM: CT CHEST WITH CONTRAST TECHNIQUE: Multidetector CT imaging of the chest was performed during intravenous contrast administration. CONTRAST:  81mL  OMNIPAQUE IOHEXOL 300 MG/ML  SOLN COMPARISON:  CT chest, 12/09/2019 FINDINGS: Cardiovascular: Right chest port catheter. Severe, irregular aortic atherosclerosis. Aortic valve calcifications. Normal heart size. Extensive 3 vessel coronary artery calcifications and/or stents, status post CABG. No pericardial effusion. Mediastinum/Nodes: Redemonstrated enlarged mediastinal lymph nodes, largest left paratracheal/AP window node measuring 1.7 cm. Thyroid gland, trachea, and esophagus demonstrate no significant findings. Lungs/Pleura: Small, loculated appearing right pleural effusion with extensive associated pleural thickening and nodularity, unchanged compared to recent prior CT. There are unchanged loculations of air within the right pleural effusion, of uncertain nature given persistence. Bibasilar atelectasis or scarring. Upper Abdomen: No acute abnormality. Musculoskeletal: No chest wall mass or suspicious bone lesions identified. IMPRESSION: 1. Small, loculated appearing right pleural effusion with extensive associated pleural thickening and nodularity, unchanged compared to recent prior CT. Findings are in keeping with known mesothelioma. 2. There are unchanged loculations of air within the right pleural effusion, of uncertain nature given persistence. A pleural drainage catheter was previously present in the right hemithorax. 3. Stable mediastinal lymphadenopathy. 4. Coronary artery disease.  Aortic Atherosclerosis (ICD10-I70.0). Electronically Signed   By: Eddie Candle M.D.   On: 12/24/2019 17:59   CT Chest W Contrast  Result Date: 12/09/2019 CLINICAL DATA:  Malignant pleural mesothelioma. Status post induction chemotherapy. EXAM: CT CHEST WITH CONTRAST TECHNIQUE: Multidetector CT imaging of the chest was performed during intravenous contrast administration. CONTRAST:  82mL OMNIPAQUE IOHEXOL 300 MG/ML  SOLN COMPARISON:  09/12/2019 FINDINGS: Cardiovascular: The heart is normal in size. No pericardial  effusion. Stable tortuosity and calcification of the thoracic aorta. No dissection. Stable advanced three-vessel coronary artery calcifications. Mediastinum/Nodes: Stable 16 mm AP window lymph node. 8 mm right hilar lymph node previously measured 13 mm. 9.5 mm subcarinal lymph node previously measured 13 mm. Lungs/Pleura: Stable emphysematous changes and pulmonary scarring with peripheral nodularity and interstitial thickening. Persistent complex right pleural effusion which contains gas and could be related to recent thoracentesis or PleurX drainage catheter. Stable appearing mild nodular enhancement around the pleura. Persistent overlying atelectasis and pulmonary scarring. No left-sided pleural effusion or pleural thickening. 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 Upper Abdomen: No significant upper abdominal findings. Stable right renal cyst. Musculoskeletal: No chest wall mass, supraclavicular or axillary adenopathy. Small scattered lymph nodes are stable. IMPRESSION: 1. Stable complex loculated right pleural fluid collection and mild enhancing nodularity. Gas in the pleural effusion may be due to a recent thoracentesis or recent PleurX drainage catheter removal. 2. 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 3. Interval decrease in size of the mediastinal and hilar lymph nodes. 4. 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 5. Stable severe atherosclerotic calcifications involving the aorta and coronary arteries. Aortic Atherosclerosis (ICD10-I70.0) and Emphysema (ICD10-J43.9). Electronically Signed   By: Marijo Sanes M.D.   On: 12/09/2019 15:50   DG Chest Port 1 View  Result Date: 12/28/2019 CLINICAL DATA:  Dyspnea.  Lung cancer history. EXAM: PORTABLE CHEST 1 VIEW COMPARISON:  12/24/2019 FINDINGS: New area of infiltrate in the left lower lobe. Chronic scarring right lung base  unchanged. Chronic right pleural effusion/scarring unchanged. Prior CABG. Negative for heart failure. Port-A-Cath tip in the cavoatrial junction unchanged. IMPRESSION: New infiltrate left lower lobe.  Probable pneumonia. Electronically Signed   By: Franchot Gallo M.D.   On: 12/28/2019 08:34  DG Chest Portable 1 View  Result Date: 12/24/2019 CLINICAL DATA:  Cough and right-sided chest pain, history of lung carcinoma EXAM: PORTABLE CHEST 1 VIEW COMPARISON:  12/09/2019 FINDINGS: Cardiac shadow is enlarged but stable. Postsurgical changes are again noted. Right chest wall port is seen. Pleural thickening and scarring in the right base is again seen and stable. No acute bony abnormality noted. IMPRESSION: Chronic changes in the right base.  No acute abnormality is seen. Electronically Signed   By: Inez Catalina M.D.   On: 12/24/2019 13:59   Micro Results   Recent Results (from the past 240 hour(s))  Respiratory Panel by RT PCR (Flu A&B, Covid) - Nasopharyngeal Swab     Status: None   Collection Time: 12/24/19  3:29 PM   Specimen: Nasopharyngeal Swab  Result Value Ref Range Status   SARS Coronavirus 2 by RT PCR NEGATIVE NEGATIVE Final    Comment: (NOTE) SARS-CoV-2 target nucleic acids are NOT DETECTED. The SARS-CoV-2 RNA is generally detectable in upper respiratoy specimens during the acute phase of infection. The lowest concentration of SARS-CoV-2 viral copies this assay can detect is 131 copies/mL. A negative result does not preclude SARS-Cov-2 infection and should not be used as the sole basis for treatment or other patient management decisions. A negative result may occur with  improper specimen collection/handling, submission of specimen other than nasopharyngeal swab, presence of viral mutation(s) within the areas targeted by this assay, and inadequate number of viral copies (<131 copies/mL). A negative result must be combined with clinical observations, patient history, and epidemiological  information. The expected result is Negative. Fact Sheet for Patients:  PinkCheek.be Fact Sheet for Healthcare Providers:  GravelBags.it This test is not yet ap proved or cleared by the [place] FDA and  has been authorized for detection and/or diagnosis of SARS-CoV-2 by FDA under an Emergency Use Authorization (EUA). This EUA will remain  in effect (meaning this test can be used) for the duration of the COVID-19 declaration under Section 564(b)(1) of the Act, 21 U.S.C. section 360bbb-3(b)(1), unless the authorization is terminated or revoked sooner.    Influenza A by PCR NEGATIVE NEGATIVE Final   Influenza B by PCR NEGATIVE NEGATIVE Final    Comment: (NOTE) The Xpert Xpress SARS-CoV-2/FLU/RSV assay is intended as an aid in  the diagnosis of influenza from Nasopharyngeal swab specimens and  should not be used as a sole basis for treatment. Nasal washings and  aspirates are unacceptable for Xpert Xpress SARS-CoV-2/FLU/RSV  testing. Fact Sheet for Patients: PinkCheek.be Fact Sheet for Healthcare Providers: GravelBags.it This test is not yet approved or cleared by the [place] FDA and  has been authorized for detection and/or diagnosis of SARS-CoV-2 by  FDA under an Emergency Use Authorization (EUA). This EUA will remain  in effect (meaning this test can be used) for the duration of the  Covid-19 declaration under Section 564(b)(1) of the Act, 21  U.S.C. section 360bbb-3(b)(1), unless the authorization is  terminated or revoked. Performed at [facility], [address], [place], [place] [address]   MRSA PCR Screening     Status: None   Collection Time: 12/24/19  6:22 PM   Specimen: Nasal Mucosa; Nasopharyngeal  Result Value Ref Range Status   MRSA by PCR NEGATIVE NEGATIVE Final    Comment:        The GeneXpert MRSA Assay (FDA approved for NASAL  specimens only), is one component of a comprehensive MRSA colonization surveillance program. It is not intended to diagnose  MRSA infection nor to guide or monitor treatment for MRSA infections. Performed at [facility], [address] [place], [place] [address]     Today   Subjective    Jamile Sivils today has no new concerns No fever  Or chills   No Nausea, Vomiting or Diarrhea       Patient has been seen and examined prior to discharge   Objective   Blood pressure 100/67, pulse 81, temperature 98.1 F (36.7 C), resp. rate 18, height 6\' 1"  (1.854 m), weight 86.8 kg, SpO2 93 %.   Intake/Output Summary (Last 24 hours) at 12/29/2019 1055 Last data filed at 12/28/2019 2200 Gross per 24 hour  Intake --  Output 200 ml  Net -200 ml   Exam Gen:- Awake Alert, chronically ill-appearing HEENT:- [place].AT, No sclera icterus Nose- [place] 2L/min Neck-Supple Neck,No JVD,.  Lungs --improving air movement , no wheezing CV- S1, S2 normal, regular, prior CABG scar, right-sided Port-A-Cath Abd-  +ve B.Sounds, Abd Soft, No tenderness,    Extremity/Skin:- No  edema, pedal pulses present  Psych-affect is appropriate, oriented x3 Neuro-generalized weakness, no new focal deficits, +ve parkinsonian tremors   Data Review   CBC w Diff:  Lab Results  Component Value Date   WBC 10.3 12/29/2019   HGB 9.5 (L) 12/29/2019   HGB 8.2 (L) 12/14/2019   HCT 30.2 (L) 12/29/2019   PLT 167 12/29/2019   PLT 286 12/14/2019   LYMPHOPCT 12 12/24/2019   MONOPCT 17 12/24/2019   EOSPCT 1 12/24/2019   BASOPCT 0 12/24/2019   CMP:  Lab Results  Component Value Date   NA 137 12/26/2019   K 3.5 12/26/2019   CL 102 12/26/2019   CO2 26 12/26/2019   BUN 18 12/26/2019   CREATININE 1.17 12/26/2019   CREATININE 1.21 12/14/2019   CREATININE 1.79 (H) 10/12/2014   PROT 6.4 (L) 12/24/2019   ALBUMIN 2.6 (L) 12/24/2019   BILITOT 0.8 12/24/2019   BILITOT <0.2 (L) 12/14/2019   ALKPHOS 72 12/24/2019   AST 21  12/24/2019   AST 11 (L) 12/14/2019   ALT 15 12/24/2019   ALT 7 12/14/2019    -Patient was initially discharged to SNF facility on 12/28/2019 but no bed was available so discharge was postponed until 12/29/2019  Total Discharge time is about 33 minutes  Roxan Hockey M.D on 12/29/2019 at 10:55 AM  Go to [email or web address] -  for contact info  [facility] - Office  [phone]

## 2019-12-28 NOTE — Progress Notes (Signed)
Physical Therapy Treatment Patient Details Name: Jeffrey [name] MRN: [id number] DOB: [birth year] Today's Date: 12/28/2019    History of Present Illness Jeffrey [name] is a 86 y.o. male with a history of coronary artery disease with NSTEMI in 2011 and status post CABG x3 vessels, mild aortic stenosis on echo in 2014, hypertension, hypothyroidism, mesothelioma of the pleura, chemotherapy-induced anemia.  Back in 2014, the patient had a transfusion reaction and was instructed to always receive blood products that have been irradiated.  Patient comes in fatigued over the past couple of weeks.  Fatigue is worse with exertion and ambulation and improved with rest.  No other palliating or provoking factors.  Denies weight loss, weight gain.  He does have a chronic cough with pleurisy.  This appears to be chronic and has been going on for several months now.    PT Comments    Pt supine in bed with wife in room and agreeable to therapy.  No reports of pain, does c/o some irritation on heel though able to tolerate gait training with no c/o.  Min A with bed mobility, transfer and gait training.  Cueing for hand placement from bed to assist with sit to stand, tendency to reach towards therapist with posterior lean.  Upon standing cueing to stand upright to assist with balance.  Min A with gait for safety, no LOB episodes.  EOS pt limited by fatigue.  Pt left in chair with wife in room and call bell within reach.     Follow Up Recommendations  SNF     Equipment Recommendations  3in1 (PT)    Recommendations for Other Services       Precautions / Restrictions Precautions Precautions: Fall Precaution Comments: O2 baseline, monitor BP Restrictions Weight Bearing Restrictions: No    Mobility  Bed Mobility Overal bed mobility: Needs Assistance Bed Mobility: Supine to Sit     Supine to sit: Min assist     General bed mobility comments: use of bedrail, pulls on therapists hands to achieve upright  sitting EOB  Transfers Overall transfer level: Needs assistance Equipment used: Rolling walker (2 wheeled) Transfers: Sit to/from Stand Sit to Stand: Mod assist;From elevated surface         General transfer comment: maintains posterior weight shift in heels despite verbal cues, cues to push with UE rather than try to pull up on walker  Ambulation/Gait Ambulation/Gait assistance: Min assist;Mod assist Gait Distance (Feet): 25 Feet Assistive device: Rolling walker (2 wheeled) Gait Pattern/deviations: Shuffle Gait velocity: decreased   General Gait Details: tends to take short shuffling steps, moving slowly with walker. Cues to take larger steps and shift weight forward with upright posturing.   Stairs             Wheelchair Mobility    Modified Rankin (Stroke Patients Only)       Balance                                            Cognition Arousal/Alertness: Awake/alert Behavior During Therapy: WFL for tasks assessed/performed Overall Cognitive Status: Within Functional Limits for tasks assessed                                 General Comments: pt did c/o RT heel pain "feels like it's sliced open"  Therapist inspected and did not see any redness, signs of pressure or open areas.      Exercises General Exercises - Lower Extremity Long Arc Quad: Both;10 reps;Seated Hip Flexion/Marching: AROM;Both;10 reps;Seated;Strengthening Toe Raises: AROM;Both;10 reps Heel Raises: AROM;Both;10 reps    General Comments        Pertinent Vitals/Pain Pain Assessment: No/denies pain    Home Living                      Prior Function            PT Goals (current goals can now be found in the care plan section)      Frequency    Min 3X/week      PT Plan Current plan remains appropriate    Co-evaluation              AM-PAC PT "6 Clicks" Mobility   Outcome Measure  Help needed turning from your back to your  side while in a flat bed without using bedrails?: A Little Help needed moving from lying on your back to sitting on the side of a flat bed without using bedrails?: A Little Help needed moving to and from a bed to a chair (including a wheelchair)?: A Little Help needed standing up from a chair using your arms (e.g., wheelchair or bedside chair)?: A Little Help needed to walk in hospital room?: A Lot Help needed climbing 3-5 steps with a railing? : Total 6 Click Score: 15    End of Session Equipment Utilized During Treatment: Gait belt Activity Tolerance: Patient tolerated treatment well Patient left: in chair;with call bell/phone within reach;with chair alarm set;with family/visitor present Nurse Communication: Mobility status PT Visit Diagnosis: Unsteadiness on feet (R26.81);Other abnormalities of gait and mobility (R26.89);Muscle weakness (generalized) (M62.81)     Time: 5784-6962 PT Time Calculation (min) (ACUTE ONLY): 33 min  Charges:  $Therapeutic Exercise: 23-37 mins $Therapeutic Activity: 23-37 mins                    [address], LPTA; CBIS [phone]   Aldona Lento 12/28/2019, 12:31 PM

## 2019-12-29 LAB — CBC
HCT: 30.2 % — ABNORMAL LOW (ref 39.0–52.0)
Hemoglobin: 9.5 g/dL — ABNORMAL LOW (ref 13.0–17.0)
MCH: 30 pg (ref 26.0–34.0)
MCHC: 31.5 g/dL (ref 30.0–36.0)
MCV: 95.3 fL (ref 80.0–100.0)
Platelets: 167 10*3/uL (ref 150–400)
RBC: 3.17 MIL/uL — ABNORMAL LOW (ref 4.22–5.81)
RDW: 17.6 % — ABNORMAL HIGH (ref 11.5–15.5)
WBC: 10.3 10*3/uL (ref 4.0–10.5)
nRBC: 0 % (ref 0.0–0.2)

## 2019-12-29 NOTE — TOC Transition Note (Signed)
Transition of Care [facility]) - CM/SW Discharge Note   Patient Details  Name: Jeffrey [name] MRN: [id number] Date of Birth: June 23, 1934  Transition of Care [facility]) CM/SW Contact:  Sherald Barge, RN Phone Number: 12/29/2019, 11:31 AM   Clinical Narrative:     Aida Puffer on 1/6. Pt ready for transport to SNF. Report can be called to [phone]. RN to arrange EMS transport once paperwork complete.    Final next level of care: Skilled Nursing Facility Barriers to Discharge: Insurance Authorization   Patient Goals and CMS Choice Patient states their goals for this hospitalization and ongoing recovery are:: to go to SNF. CMS Medicare.gov Compare Post Acute Care list provided to:: Patient Represenative (must comment) Choice offered to / list presented to : Patient  Discharge Placement              Patient chooses bed at: [facility] health and rehab)   Name of family member notified: Seabron Iannello - wife Patient and family notified of of transfer: 12/29/19

## 2019-12-29 NOTE — Progress Notes (Signed)
Patient Demographics:    Jeffrey [name], is a 86 y.o. male, DOB - January 02, 1934, AJO:[id number]  Admit date - 12/24/2019   Admitting Physician Katiria Calame Denton Brick, MD  Outpatient Primary MD for the patient is [name], MD  LOS - 4   Chief Complaint  Patient presents with  . Cough  . Back Pain        Subjective:    Jeffrey [name] today has no fevers, no emesis,  No chest pain,   No new complaints - No Nausea, Vomiting or Diarrhea  -Generalized weakness persist     Assessment  & Plan :    Principal Problem:   Symptomatic anemia Active Problems:   History of pulmonary embolism   CAD (coronary artery disease) of artery bypass graft   Malignant mesothelioma of pleura (HCC)   COPD (chronic obstructive pulmonary disease) (HCC)   Antineoplastic chemotherapy induced anemia   Brief Summary:- 85 y.o.malewith a history of CAD with NSTEMI in 2011 and status post CABG x3 vessels, mild aortic stenosis on echo in 2014, HTN, hypothyroidism, mesothelioma of the pleura, chemotherapy-induced anemia-admitted on 12/23/2018 with symptomatic anemia with hemoglobin down to 6.2 -Patient has history of antibodies and difficult to match blood products with prior adverse/allergic reaction to transfusion --He has been waiting to get the irradiated blood from [facility] -Goal is to keep hemoglobin close to 9 given history of CAD  -transfer to [facility] and rehab SNF on 12/27/2018 (repeat Covid test negative on 12/27/2019)  -Patient was initially discharged to SNF facility on 12/28/2019 but no bed was available so discharge was postponed until 12/29/2019  A/p 1)Symptomatic acute on chronic anemia---  -admission hemoglobin 6.2, hemoglobin up to 9.5 after 3 units of packed cells --Goal is to keep hemoglobin close to 9 given history of CAD -stool occult blood requested -Suspect worsening anemia is chemotherapy  induced -Continue supplemental oxygen - fatigue, dyspnea on exertion and dizziness improving after transfusion  2)CAD----chest pain-free, continue aspirin, no evidence of active bleeding at this time, try to keep hemoglobin close to 9 -Continue aspirin,   3)H/o PE----continue Xarelto especially given underlying malignancy patient high risk for further clots/VTE. -Watch for bleeding  4)Mesothelioma of the pleura--ongoing follow-up with oncology  5) COPD--no acute exacerbation at this time continue bronchodilators -Supplemental oxygen as ordered  6)Generalized weakness/Deconditioning/soft BP/orthostatic dizziness--- it is making it difficult for patient to ambulate and participate in therapy, c/n Midodrine 5 mg 3 times daily for BP support -Physical therapy evaluation appreciated recommend SNF rehab,  -patient and wife agreeable to transfer to SNF rehab    Disposition---transfer to [facility] and rehab SNF on 12/27/2018 (repeat Covid test negative on 12/27/2019)-   Code Status : DNR  Family Communication:    (patient is alert, awake and coherent) Discussed with wife at bedside  Disposition Plan  : -transfer to [facility] health and rehab SNF on 12/28/2018 (repeat Covid test negative on 12/27/2019)  --Patient was initially discharged to SNF facility on 12/28/2019 but no bed was available so discharge was postponed until 12/29/2019  Discharge Condition: Stable,  Lab Results  Component Value Date   PLT 167 12/29/2019    Inpatient Medications  Scheduled Meds: . aspirin EC  81 mg Oral Daily  .  Chlorhexidine Gluconate Cloth  6 each Topical Q0600  . folic acid  1 mg Oral Daily  . guaiFENesin  600 mg Oral BID  . levothyroxine  200 mcg Oral Q MTWThF  . mouth rinse  15 mL Mouth Rinse BID  . midodrine  5 mg Oral TID WC  . mirtazapine  15 mg Oral QHS  . mometasone-formoterol  2 puff Inhalation BID  . pantoprazole  40 mg Oral Daily  . rivaroxaban  20 mg Oral Q supper  .  senna-docusate  2 tablet Oral BID   Continuous Infusions: PRN Meds:.albuterol, ALPRAZolam, bisacodyl, HYDROcodone-homatropine, ipratropium-albuterol, ondansetron **OR** ondansetron (ZOFRAN) IV, oxyCODONE, prochlorperazine, traMADol    Anti-infectives (From admission, onward)   None        Objective:   Vitals:   12/28/19 2026 12/28/19 2125 12/29/19 0535 12/29/19 0743  BP:  116/66 100/67   Pulse:  73 81   Resp:  18 18   Temp:  98.5 F (36.9 C) 98.1 F (36.7 C)   TempSrc:      SpO2: 97% 100% 99% 93%  Weight:      Height:        Wt Readings from Last 3 Encounters:  12/28/19 86.8 kg  12/14/19 85.5 kg  11/22/19 86.5 kg     Intake/Output Summary (Last 24 hours) at 12/29/2019 1056 Last data filed at 12/28/2019 2200 Gross per 24 hour  Intake -  Output 200 ml  Net -200 ml     Physical Exam Gen:- Awake Alert, chronically ill-appearing HEENT:- [place].AT, No sclera icterus Nose- [place] 2L/min Neck-Supple Neck,No JVD,.  Lungs --improving air movement ,no wheezing CV- S1, S2 normal, regular, prior CABG scar, right-sided Port-A-Cath Abd- +ve B.Sounds, Abd Soft, No tenderness,  Extremity/Skin:- No edema, pedal pulses present  Psych-affect is appropriate, oriented x3 Neuro-generalized weakness, no new focal deficits,+ve parkinsoniantremors    Data Review:   Micro Results Recent Results (from the past 240 hour(s))  Respiratory Panel by RT PCR (Flu A&B, Covid) - Nasopharyngeal Swab     Status: None   Collection Time: 12/24/19  3:29 PM   Specimen: Nasopharyngeal Swab  Result Value Ref Range Status   SARS Coronavirus 2 by RT PCR NEGATIVE NEGATIVE Final    Comment: (NOTE) SARS-CoV-2 target nucleic acids are NOT DETECTED. The SARS-CoV-2 RNA is generally detectable in upper respiratoy specimens during the acute phase of infection. The lowest concentration of SARS-CoV-2 viral copies this assay can detect is 131 copies/mL. A negative result does not preclude SARS-Cov-2  infection and should not be used as the sole basis for treatment or other patient management decisions. A negative result may occur with  improper specimen collection/handling, submission of specimen other than nasopharyngeal swab, presence of viral mutation(s) within the areas targeted by this assay, and inadequate number of viral copies (<131 copies/mL). A negative result must be combined with clinical observations, patient history, and epidemiological information. The expected result is Negative. Fact Sheet for Patients:  PinkCheek.be Fact Sheet for Healthcare Providers:  GravelBags.it This test is not yet ap proved or cleared by the [place] FDA and  has been authorized for detection and/or diagnosis of SARS-CoV-2 by FDA under an Emergency Use Authorization (EUA). This EUA will remain  in effect (meaning this test can be used) for the duration of the COVID-19 declaration under Section 564(b)(1) of the Act, 21 U.S.C. section 360bbb-3(b)(1), unless the authorization is terminated or revoked sooner.    Influenza A by PCR NEGATIVE NEGATIVE Final  Influenza B by PCR NEGATIVE NEGATIVE Final    Comment: (NOTE) The Xpert Xpress SARS-CoV-2/FLU/RSV assay is intended as an aid in  the diagnosis of influenza from Nasopharyngeal swab specimens and  should not be used as a sole basis for treatment. Nasal washings and  aspirates are unacceptable for Xpert Xpress SARS-CoV-2/FLU/RSV  testing. Fact Sheet for Patients: PinkCheek.be Fact Sheet for Healthcare Providers: GravelBags.it This test is not yet approved or cleared by the [place] FDA and  has been authorized for detection and/or diagnosis of SARS-CoV-2 by  FDA under an Emergency Use Authorization (EUA). This EUA will remain  in effect (meaning this test can be used) for the duration of the  Covid-19 declaration  under Section 564(b)(1) of the Act, 21  U.S.C. section 360bbb-3(b)(1), unless the authorization is  terminated or revoked. Performed at [facility], [address] [place], [place] [address]   MRSA PCR Screening     Status: None   Collection Time: 12/24/19  6:22 PM   Specimen: Nasal Mucosa; Nasopharyngeal  Result Value Ref Range Status   MRSA by PCR NEGATIVE NEGATIVE Final    Comment:        The GeneXpert MRSA Assay (FDA approved for NASAL specimens only), is one component of a comprehensive MRSA colonization surveillance program. It is not intended to diagnose MRSA infection nor to guide or monitor treatment for MRSA infections. Performed at [facility], [address], [place], [place] [address]     Radiology Reports CT Chest W Contrast  Result Date: 12/24/2019 CLINICAL DATA:  Pneumonia, right-sided chest pain, cough, fatigue, mesothelioma EXAM: CT CHEST WITH CONTRAST TECHNIQUE: Multidetector CT imaging of the chest was performed during intravenous contrast administration. CONTRAST:  75mL OMNIPAQUE IOHEXOL 300 MG/ML  SOLN COMPARISON:  CT chest, 12/09/2019 FINDINGS: Cardiovascular: Right chest port catheter. Severe, irregular aortic atherosclerosis. Aortic valve calcifications. Normal heart size. Extensive 3 vessel coronary artery calcifications and/or stents, status post CABG. No pericardial effusion. Mediastinum/Nodes: Redemonstrated enlarged mediastinal lymph nodes, largest left paratracheal/AP window node measuring 1.7 cm. Thyroid gland, trachea, and esophagus demonstrate no significant findings. Lungs/Pleura: Small, loculated appearing right pleural effusion with extensive associated pleural thickening and nodularity, unchanged compared to recent prior CT. There are unchanged loculations of air within the right pleural effusion, of uncertain nature given persistence. Bibasilar atelectasis or scarring. Upper Abdomen: No acute abnormality. Musculoskeletal: No chest wall mass or  suspicious bone lesions identified. IMPRESSION: 1. Small, loculated appearing right pleural effusion with extensive associated pleural thickening and nodularity, unchanged compared to recent prior CT. Findings are in keeping with known mesothelioma. 2. There are unchanged loculations of air within the right pleural effusion, of uncertain nature given persistence. A pleural drainage catheter was previously present in the right hemithorax. 3. Stable mediastinal lymphadenopathy. 4. Coronary artery disease.  Aortic Atherosclerosis (ICD10-I70.0). Electronically Signed   By: Eddie Candle M.D.   On: 12/24/2019 17:59   CT Chest W Contrast  Result Date: 12/09/2019 CLINICAL DATA:  Malignant pleural mesothelioma. Status post induction chemotherapy. EXAM: CT CHEST WITH CONTRAST TECHNIQUE: Multidetector CT imaging of the chest was performed during intravenous contrast administration. CONTRAST:  50mL OMNIPAQUE IOHEXOL 300 MG/ML  SOLN COMPARISON:  09/12/2019 FINDINGS: Cardiovascular: The heart is normal in size. No pericardial effusion. Stable tortuosity and calcification of the thoracic aorta. No dissection. Stable advanced three-vessel coronary artery calcifications. Mediastinum/Nodes: Stable 16 mm AP window lymph node. 8 mm right hilar lymph node previously measured 13 mm. 9.5 mm subcarinal lymph node previously measured  13 mm. Lungs/Pleura: Stable emphysematous changes and pulmonary scarring with peripheral nodularity and interstitial thickening. Persistent complex right pleural effusion which contains gas and could be related to recent thoracentesis or PleurX drainage catheter. Stable appearing mild nodular enhancement around the pleura. Persistent overlying atelectasis and pulmonary scarring. No left-sided pleural effusion or pleural thickening. 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 Upper Abdomen: No significant upper abdominal findings. Stable right renal cyst.  Musculoskeletal: No chest wall mass, supraclavicular or axillary adenopathy. Small scattered lymph nodes are stable. IMPRESSION: 1. Stable complex loculated right pleural fluid collection and mild enhancing nodularity. Gas in the pleural effusion may be due to a recent thoracentesis or recent PleurX drainage catheter removal. 2. 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 3. Interval decrease in size of the mediastinal and hilar lymph nodes. 4. 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 5. Stable severe atherosclerotic calcifications involving the aorta and coronary arteries. Aortic Atherosclerosis (ICD10-I70.0) and Emphysema (ICD10-J43.9). Electronically Signed   By: Marijo Sanes M.D.   On: 12/09/2019 15:50   DG Chest Port 1 View  Result Date: 12/28/2019 CLINICAL DATA:  Dyspnea.  Lung cancer history. EXAM: PORTABLE CHEST 1 VIEW COMPARISON:  12/24/2019 FINDINGS: New area of infiltrate in the left lower lobe. Chronic scarring right lung base unchanged. Chronic right pleural effusion/scarring unchanged. Prior CABG. Negative for heart failure. Port-A-Cath tip in the cavoatrial junction unchanged. IMPRESSION: New infiltrate left lower lobe.  Probable pneumonia. Electronically Signed   By: Franchot Gallo M.D.   On: 12/28/2019 08:34   DG Chest Portable 1 View  Result Date: 12/24/2019 CLINICAL DATA:  Cough and right-sided chest pain, history of lung carcinoma EXAM: PORTABLE CHEST 1 VIEW COMPARISON:  12/09/2019 FINDINGS: Cardiac shadow is enlarged but stable. Postsurgical changes are again noted. Right chest wall port is seen. Pleural thickening and scarring in the right base is again seen and stable. No acute bony abnormality noted. IMPRESSION: Chronic changes in the right base.  No acute abnormality is seen. Electronically Signed   By: Inez Catalina M.D.   On: 12/24/2019 13:59     CBC Recent Labs  Lab 12/24/19 1424 12/25/19 0239 12/25/19  1042 12/26/19 0516 12/27/19 0404 12/29/19 0925  WBC 12.7*  --  12.2* 11.8* 12.4* 10.3  HGB 6.2* 7.4* 7.8* 8.7* 9.4* 9.5*  HCT 20.2* 23.4* 24.1* 26.8* 30.0* 30.2*  PLT 146*  --  141* 137* 159 167  MCV 100.0  --  93.8 92.4 93.8 95.3  MCH 30.7  --  30.4 30.0 29.4 30.0  MCHC 30.7  --  32.4 32.5 31.3 31.5  RDW 20.2*  --  21.0* 18.9* 18.6* 17.6*  LYMPHSABS 1.5  --   --   --   --   --   MONOABS 2.2*  --   --   --   --   --   EOSABS 0.1  --   --   --   --   --   BASOSABS 0.0  --   --   --   --   --     Chemistries  Recent Labs  Lab 12/24/19 1424 12/26/19 0516  NA 136 137  K 4.6 3.5  CL 103 102  CO2 23 26  GLUCOSE 109* 98  BUN 24* 18  CREATININE 1.35* 1.17  CALCIUM 8.1* 8.0*  AST 21  --   ALT 15  --   ALKPHOS 72  --  BILITOT 0.8  --    ------------------------------------------------------------------------------------------------------------------ No results for input(s): CHOL, HDL, LDLCALC, TRIG, CHOLHDL, LDLDIRECT in the last 72 hours.  No results found for: HGBA1C ------------------------------------------------------------------------------------------------------------------ No results for input(s): TSH, T4TOTAL, T3FREE, THYROIDAB in the last 72 hours.  Invalid input(s): FREET3 ------------------------------------------------------------------------------------------------------------------ No results for input(s): VITAMINB12, FOLATE, FERRITIN, TIBC, IRON, RETICCTPCT in the last 72 hours.  Coagulation profile No results for input(s): INR, PROTIME in the last 168 hours.  No results for input(s): DDIMER in the last 72 hours.  Cardiac Enzymes No results for input(s): CKMB, TROPONINI, MYOGLOBIN in the last 168 hours.  Invalid input(s): CK ------------------------------------------------------------------------------------------------------------------    Component Value Date/Time   BNP 646.0 (H) 08/19/2019 1801    Roxan Hockey M.D on 12/29/2019 at 10:56 AM   Go to [email or web address] - for contact info  [facility] - Office  [phone]

## 2019-12-29 NOTE — Progress Notes (Signed)
Report called to [place] at [facility].  Port deaccessed and IV removed. Wife in room.  EMS called

## 2020-01-19 ENCOUNTER — Ambulatory Visit: Payer: Medicare Other | Admitting: Primary Care

## 2020-01-20 ENCOUNTER — Encounter: Payer: Self-pay | Admitting: Primary Care

## 2020-01-20 ENCOUNTER — Other Ambulatory Visit: Payer: Self-pay

## 2020-01-20 ENCOUNTER — Ambulatory Visit: Payer: Medicare Other | Admitting: Primary Care

## 2020-01-20 VITALS — BP 110/68 | HR 77 | Temp 97.1°F | Ht 73.0 in | Wt 175.0 lb

## 2020-01-20 DIAGNOSIS — R05 Cough: Secondary | ICD-10-CM

## 2020-01-20 DIAGNOSIS — G20 Parkinson's disease: Secondary | ICD-10-CM

## 2020-01-20 DIAGNOSIS — R0602 Shortness of breath: Secondary | ICD-10-CM | POA: Diagnosis not present

## 2020-01-20 DIAGNOSIS — J410 Simple chronic bronchitis: Secondary | ICD-10-CM

## 2020-01-20 DIAGNOSIS — J90 Pleural effusion, not elsewhere classified: Secondary | ICD-10-CM

## 2020-01-20 DIAGNOSIS — R058 Other specified cough: Secondary | ICD-10-CM

## 2020-01-20 MED ORDER — FLUTTER DEVI: 1.0000 | 0 refills | Status: AC | PRN

## 2020-01-20 MED ORDER — UMECLIDINIUM BROMIDE 62.5 MCG/INH IN AEPB: 1.0000 | INHALATION_SPRAY | Freq: Every day | RESPIRATORY_TRACT | 6 refills | Status: AC

## 2020-01-20 MED ORDER — GUAIFENESIN ER 600 MG PO TB12: 1200.0000 mg | ORAL_TABLET | Freq: Two times a day (BID) | ORAL | 3 refills | Status: AC | PRN

## 2020-01-20 MED ORDER — SODIUM CHLORIDE 3 % IN NEBU: INHALATION_SOLUTION | RESPIRATORY_TRACT | 2 refills | Status: AC | PRN

## 2020-01-20 MED ORDER — FLUTICASONE-SALMETEROL 250-50 MCG/DOSE IN AEPB: 1.0000 | INHALATION_SPRAY | Freq: Two times a day (BID) | RESPIRATORY_TRACT | 5 refills | Status: AC

## 2020-01-20 NOTE — Assessment & Plan Note (Signed)
-   High risk for aspiration - Refer speech and swallow eval  - Patient would benefit from OT

## 2020-01-20 NOTE — Assessment & Plan Note (Addendum)
-   Reports shortness of breath on exertion and thick congested cough  - No PFTs on file, last spirometry at [facility] in 2013 showed no obstruction and mild restriction. Patient unlikely to be able to complete testing now d.t parkinson  - Increased Advair to 250/6mcg- one puff twice daily (rinse mouth after) - Changing Spiriva to Incruse one puff daily d/t insurance preference  - Could consider discontinuing LAMA if of no benefit

## 2020-01-20 NOTE — Assessment & Plan Note (Addendum)
-   Congested upper airway cough with thick white "stringly" mucus. Difficulty getting mucus up d/t decreased muscle strength from parkinson's - Increased mucinex 1,200mg  twice a day with a glass of water  - Use hypertonic saline nebulizer twice daily as needed for congestion/cough - Flutter valve 3-4 times a day  - Consider cough assist device if needed

## 2020-01-20 NOTE — Progress Notes (Signed)
@Patient  ID: Jeffrey [name], male    DOB: 20-Mar-1934, 86 y.o.   MRN: [id number]  Chief Complaint  Patient presents with  . Follow-up    SOB. o2 2l PRN     Referring provider: [name], MD  HPI: 86 year old male, former smoker quit 1968 (15-pack-year history). 10 year history of asbestos exposure from working in [employer] 40 years ago. Past medical history significant for malignant pleural effusion mesothelioma, COPD/asthma, OSA, Parkinson's disease, rheumatoid arthritis, hypothyroidism, MRSE septic arthritis left knee, left total hip arthroplasty, anemia/ITP, coronary artery disease s/p CABG, mild aortic stenosis, anxiety. He is new patient to [facility] pulmonary office, he was see in inpatient by Dr. Elsworth Soho. No PFTs on file, last spirometry at [facility] in 2013 showed no obstruction and mild restriction. Patient unlikely to be able to complete testing now d.t parkinson   Recent hospitalization from 5/23-6/6 for shortness of breath and right pleural effusion. Consulted inpatient by Dr. Nelda Marseille and Dr. Elsworth Soho with pulmonary. CT chest 5/23 showed loculated right pleural effusion and pleural plaques.  CTA on 6/1 showed no PE. Status post right thoracentesis on 5/25. Fluid culture showed lymphocytic exudate with low glucose, felt to be unlikely rheumatoid effusion. Cytology negative with a few mesothelial cells. Uncertain for mesothelioma related to asbestos exposure.  Since TCTS consulted and patient had right VATS with pleural biopsy, TALC and right Pleurx catheter by [name] on 5/29.  Course complicated by an NSTEMI, status post catheterization per cardiology 6/4 with PCI to proximal LCx with DES.  Recommending aspirin and Brilinta x3 months and then Brilinta x9 months. Plan drainage pleurx MWF. Surgical pathology is still pending- contacted lab and was told pathology was sent for second opinion.  Previous [facility] pulmonary encounter: 10/05/19 Consult - Dr. Valeta Harms Draining right pleurx catheter on  Monday and Thursday. He typically gets out 150cc. Currently undergoing chemotherapy for treatment of mesothelioma.  Support for any respiratory symptoms Followed by Dr. Mohamed/oncology and cardiothoracic surgery   01/20/2020 Patient presents today for 6 month follow-up Malignant right pleural effusion. Accompanied by this wife. Reports shortness of breath on exertion and persistent cough with thick mucus/congestion. He is taking mucinex 600mg  twice daily. Spiriva to be changed to Incruse d/t formulary preference. Currently under observation with oncology, s/p 7 cycles chemotherapy. Next follow-up with oncology is in march with PET. Not currently following with neurology for parkinson's. Receiving physical therapy and nursing twice a week at home.    Imaging: 08/05/19 CTA- showed at least a single pulmonary embolism within a peripheral subsegmental branch to the posterior segment of the RIGHT lower lobe. Additional peripheral pulmonary emboli suspected. No central obstructing pulmonary embolism is identified within the main or intralobar pulmonary arteries bilaterally. Cardiomegaly. No pericardial effusion. Chronic atelectasis/pleural thickening at the RIGHT lung base. No evidence of pneumonia or pulmonary edema. No pleural effusion  06/30/19 PET- Thin rind of pleural tumor encasing the right lung identified and is FDG compatible with malignant mesothelioma. Small loculated right pleural effusion. Mildly FDG avid right hilar, subcarinal and high left paratracheal lymph nodes which may reflect metastatic adenopathy  Allergies  Allergen Reactions  . Lipitor [Atorvastatin] Other (See Comments)    muscle spasms cramps  . Methylprednisolone Other (See Comments)    cramps cramps  . Morphine And Related     Hallucinations at times  . Other     If patient is to receive blood - blood must be treated with radiation because his body will not accept a  normal infusion   . Sulfa Antibiotics Itching  .  Ciprofloxacin Rash  . Doxycycline Rash    Doxycycline caused itching redness on scalp and head Doxycycline caused itching redness on scalp and head    Immunization History  Administered Date(s) Administered  . Influenza,inj,Quad PF,6+ Mos 09/04/2014  . Influenza-Unspecified 09/18/2016  . Pneumococcal Polysaccharide-23 09/05/2011  . Tdap 09/16/2012    Past Medical History:  Diagnosis Date  . Anxiety   . Aortic stenosis    mild AS by 07/20/13 echo ([facility])  . Arthritis    "all over"  . Coronary artery disease    NSTEMI 06/2010, CABG x3=> [place]->LAD, SVG->OM1, SVG->PDA, DES LCx 10/2011, DES LAD 12/2011  . Depression   . GERD (gastroesophageal reflux disease)   . H/O hiatal hernia   . Hearing aid worn    B/L  . High cholesterol   . History of blood transfusion reaction March 04, 2013   "he almost died; he has to get irradiated blood next time"  . Hypertension   . Hypothyroidism   . Ischemic cardiomyopathy   . ITP (idiopathic thrombocytopenic purpura)    Dr. Gaynelle Arabian, on [place]  . Myocardial infarction ([place]) March 04, 2010  . Pneumonia 1940's X 1; 03/04/2014 X 1  . Rheumatoid arthritis ([place])   . Shortness of breath    with exertion, has not been very active  . Sleep apnea   . Wears glasses   . Wears partial dentures     Tobacco History: Social History   Tobacco Use  Smoking Status Former Smoker  . Packs/day: 1.00  . Years: 15.00  . Pack years: 15.00  . Types: Cigarettes  . Quit date: 12/22/1966  . Years since quitting: 53.1  Smokeless Tobacco Former Systems developer  . Types: Chew  Tobacco Comment   "quit smoking ~ late ~ 60's; quit chewing in the 1970's"   Counseling given: Not Answered Comment: "quit smoking ~ late ~ 60's; quit chewing in the 1970's"   Outpatient Medications Prior to Visit  Medication Sig Dispense Refill  . albuterol (VENTOLIN HFA) 108 (90 Base) MCG/ACT inhaler Inhale 1 puff into the lungs 2 (two) times daily as needed for shortness of breath.    .  ALPRAZolam (XANAX) 0.25 MG tablet Take 1 tablet (0.25 mg total) by mouth 2 (two) times daily as needed for anxiety. 10 tablet 0  . aspirin EC 81 MG tablet Take 1 tablet (81 mg total) by mouth daily with breakfast. 30 tablet 2  . chlorproMAZINE (THORAZINE) 25 MG tablet TAKE 1 TABLET BY MOUTH THREE TIMES DAILY (Patient taking differently: Take 25 mg by mouth 3 (three) times daily as needed for nausea. Before during and after chemo) 30 tablet 0  . dexamethasone (DECADRON) 4 MG tablet 4 mg p.o. twice daily the day before, day of and day after chemotherapy every 3 weeks 40 tablet 1  . folic acid (FOLVITE) 1 MG tablet Take 1 tablet (1 mg total) by mouth daily. 30 tablet 4  . ipratropium-albuterol (DUONEB) 0.5-2.5 (3) MG/3ML SOLN Take 3 mLs by nebulization every 6 (six) hours as needed. J44.9 (Patient taking differently: Take 3 mLs by nebulization every 6 (six) hours as needed (shortness of breath). J44.9) 360 mL 3  . levothyroxine (SYNTHROID, LEVOTHROID) 200 MCG tablet Take 200 mcg by mouth See admin instructions. Take 200 mg tablet Monday through Friday - None on Saturday or Mar 05, 2023.    . midodrine (PROAMATINE) 5 MG tablet Take 1 tablet (5 mg total) by mouth  3 (three) times daily with meals. 90 tablet 1  . mirtazapine (REMERON) 15 MG tablet Take 1 tablet (15 mg total) by mouth at bedtime. 30 tablet 2  . omeprazole (PRILOSEC) 40 MG capsule Take 40 mg by mouth at bedtime.     . ondansetron (ZOFRAN) 4 MG tablet Take 1 tablet (4 mg total) by mouth every 6 (six) hours as needed for nausea. 20 tablet 0  . rivaroxaban (XARELTO) 20 MG TABS tablet Take 20 mg by mouth daily with supper.    . senna-docusate (SENOKOT-S) 8.6-50 MG tablet Take 2 tablets by mouth 2 (two) times daily. 120 tablet 2  . Fluticasone-Salmeterol (ADVAIR) 100-50 MCG/DOSE AEPB Inhale 1 puff into the lungs 2 (two) times daily.     [name] Kitchen guaiFENesin (MUCINEX) 600 MG 12 hr tablet Take 1 tablet (600 mg total) by mouth 2 (two) times daily. 60 tablet 1  .  HYDROcodone-homatropine (HYCODAN) 5-1.5 MG/5ML syrup Take 5 mLs by mouth every 8 (eight) hours as needed for cough. 120 mL 0  . oxyCODONE (OXY IR/ROXICODONE) 5 MG immediate release tablet Take 1 tablet (5 mg total) by mouth every 8 (eight) hours as needed for severe pain. (Patient not taking: Reported on 01/20/2020) 10 tablet 0   No facility-administered medications prior to visit.   Review of Systems  Review of Systems  Respiratory: Positive for cough and shortness of breath. Negative for chest tightness and wheezing.    Physical Exam  BP 110/68 (BP Location: Left Arm, Patient Position: Sitting, Cuff Size: Normal)   Pulse 77   Temp (!) 97.1 F (36.2 C) (Temporal)   Ht 6\' 1"  (1.854 m)   Wt 175 lb (79.4 kg)   SpO2 99% Comment: room air  BMI 23.09 kg/m  Physical Exam Constitutional:      General: He is not in acute distress. HENT:     Head: Normocephalic and atraumatic.     Mouth/Throat:     Comments: Deferred d/t masking Cardiovascular:     Rate and Rhythm: Normal rate.     Heart sounds: Murmur present.  Pulmonary:     Breath sounds: No wheezing or rhonchi.     Comments: Poor effort, lungs diminished. Frequent coughing Musculoskeletal:     Comments: In WC  Skin:    General: Skin is warm and dry.  Neurological:     General: No focal deficit present.     Mental Status: He is alert and oriented to person, place, and time. Mental status is at baseline.  Psychiatric:        Mood and Affect: Mood normal.        Behavior: Behavior normal.        Thought Content: Thought content normal.        Judgment: Judgment normal.      Lab Results:  CBC    Component Value Date/Time   WBC 10.3 12/29/2019 0925   RBC 3.17 (L) 12/29/2019 0925   HGB 9.5 (L) 12/29/2019 0925   HGB 8.2 (L) 12/14/2019 0822   HCT 30.2 (L) 12/29/2019 0925   PLT 167 12/29/2019 0925   PLT 286 12/14/2019 0822   MCV 95.3 12/29/2019 0925   MCH 30.0 12/29/2019 0925   MCHC 31.5 12/29/2019 0925   RDW 17.6 (H)  12/29/2019 0925   LYMPHSABS 1.5 12/24/2019 1424   MONOABS 2.2 (H) 12/24/2019 1424   EOSABS 0.1 12/24/2019 1424   BASOSABS 0.0 12/24/2019 1424    BMET    Component Value Date/Time  NA 137 12/26/2019 0516   K 3.5 12/26/2019 0516   CL 102 12/26/2019 0516   CO2 26 12/26/2019 0516   GLUCOSE 98 12/26/2019 0516   BUN 18 12/26/2019 0516   CREATININE 1.17 12/26/2019 0516   CREATININE 1.21 12/14/2019 0822   CREATININE 1.79 (H) 10/12/2014 1136   CALCIUM 8.0 (L) 12/26/2019 0516   GFRNONAA 57 (L) 12/26/2019 0516   GFRNONAA 55 (L) 12/14/2019 0822   GFRNONAA 36 (L) 10/12/2014 1136   GFRAA >60 12/26/2019 0516   GFRAA >60 12/14/2019 0822   GFRAA 41 (L) 10/12/2014 1136    BNP    Component Value Date/Time   BNP 646.0 (H) 08/19/2019 1801    ProBNP No results found for: PROBNP  Imaging: CT Chest W Contrast  Result Date: 12/24/2019 CLINICAL DATA:  Pneumonia, right-sided chest pain, cough, fatigue, mesothelioma EXAM: CT CHEST WITH CONTRAST TECHNIQUE: Multidetector CT imaging of the chest was performed during intravenous contrast administration. CONTRAST:  33mL OMNIPAQUE IOHEXOL 300 MG/ML  SOLN COMPARISON:  CT chest, 12/09/2019 FINDINGS: Cardiovascular: Right chest port catheter. Severe, irregular aortic atherosclerosis. Aortic valve calcifications. Normal heart size. Extensive 3 vessel coronary artery calcifications and/or stents, status post CABG. No pericardial effusion. Mediastinum/Nodes: Redemonstrated enlarged mediastinal lymph nodes, largest left paratracheal/AP window node measuring 1.7 cm. Thyroid gland, trachea, and esophagus demonstrate no significant findings. Lungs/Pleura: Small, loculated appearing right pleural effusion with extensive associated pleural thickening and nodularity, unchanged compared to recent prior CT. There are unchanged loculations of air within the right pleural effusion, of uncertain nature given persistence. Bibasilar atelectasis or scarring. Upper Abdomen: No  acute abnormality. Musculoskeletal: No chest wall mass or suspicious bone lesions identified. IMPRESSION: 1. Small, loculated appearing right pleural effusion with extensive associated pleural thickening and nodularity, unchanged compared to recent prior CT. Findings are in keeping with known mesothelioma. 2. There are unchanged loculations of air within the right pleural effusion, of uncertain nature given persistence. A pleural drainage catheter was previously present in the right hemithorax. 3. Stable mediastinal lymphadenopathy. 4. Coronary artery disease.  Aortic Atherosclerosis (ICD10-I70.0). Electronically Signed   By: Eddie Candle M.D.   On: 12/24/2019 17:59   DG Chest Port 1 View  Result Date: 12/28/2019 CLINICAL DATA:  Dyspnea.  Lung cancer history. EXAM: PORTABLE CHEST 1 VIEW COMPARISON:  12/24/2019 FINDINGS: New area of infiltrate in the left lower lobe. Chronic scarring right lung base unchanged. Chronic right pleural effusion/scarring unchanged. Prior CABG. Negative for heart failure. Port-A-Cath tip in the cavoatrial junction unchanged. IMPRESSION: New infiltrate left lower lobe.  Probable pneumonia. Electronically Signed   By: Franchot Gallo M.D.   On: 12/28/2019 08:34   DG Chest Portable 1 View  Result Date: 12/24/2019 CLINICAL DATA:  Cough and right-sided chest pain, history of lung carcinoma EXAM: PORTABLE CHEST 1 VIEW COMPARISON:  12/09/2019 FINDINGS: Cardiac shadow is enlarged but stable. Postsurgical changes are again noted. Right chest wall port is seen. Pleural thickening and scarring in the right base is again seen and stable. No acute bony abnormality noted. IMPRESSION: Chronic changes in the right base.  No acute abnormality is seen. Electronically Signed   By: Inez Catalina M.D.   On: 12/24/2019 13:59     Assessment & Plan:   COPD (chronic obstructive pulmonary disease) (HCC) - Reports shortness of breath on exertion and thick congested cough  - No PFTs on file, last  [facility]rometry [facility] in 2013 showed no obstruction and mild restriction. Patient unlikely to be able to complete testing  now d.t parkinson  - Increased Advair to 250/32mcg- one puff twice daily (rinse mouth after) - Changing Spiriva to Incruse one puff daily d/t insurance preference  - Could consider discontinuing LAMA if of no benefit   Cough with sputum - Congested upper airway cough with thick white "stringly" mucus. Difficulty getting mucus up d/t decreased muscle strength from parkinson's - Increased mucinex 1,200mg  twice a day with a glass of water  - Use hypertonic saline nebulizer twice daily as needed for congestion/cough - Flutter valve 3-4 times a day  - Consider cough assist device if needed    PD (Parkinson's disease) ([place]) - High risk for aspiration - Refer speech and swallow eval  - Patient would benefit from [place], NP 01/20/2020

## 2020-01-20 NOTE — Patient Instructions (Addendum)
Recommendations: Increased mucinex 1,200mg  (two tablets) twice a day with a glass of water  Increased Advair to 250/50- one puff twice daily (rinse mouth after) Changing Spiriva to Incruse one puff daily  Use hypertonic saline nebulizer twice daily as needed for congestion/cough Then use flutter valve after wards for congestion   Orders: Flutter valve 3-4 times a day   Rx: Incruse one puffs once a day  Advair 250/50 1 puff twice daily  Hypertonic saline nebulizer as needed for chest congestion   Referral: Occupational therapy Speech and swallow eval   Follow-up: 6-8 weeks with Dr. Valeta Harms or Eustaquio Maize NP

## 2020-01-24 NOTE — Progress Notes (Signed)
PCCM: Thanks for seeing him Garner Nash, DO [facility] 01/24/2020 4:59 PM

## 2020-01-26 ENCOUNTER — Telehealth: Payer: Self-pay | Admitting: Primary Care

## 2020-01-26 NOTE — Telephone Encounter (Signed)
lmtcb for [place].

## 2020-01-26 NOTE — Telephone Encounter (Signed)
Jeffrey [name] with speech thru [facility] called to let Beth know:  She saw patient yesterday - patient did have a lot of coughing but it was not increased with intake and there was no sign of aspiration.  Jeffrey [name] did do an evaluation and education but wife did not want any speech intervention at this time.  Patient's appetite is increasing - he's taking a medication related to Megace Jeffrey [name] could not remember the exact med name).  Just just calling to let Beth know. Routing as [employer]

## 2020-02-06 ENCOUNTER — Telehealth: Payer: Self-pay | Admitting: Medical Oncology

## 2020-02-06 NOTE — Telephone Encounter (Signed)
Jeffrey [name] notified that Julien Nordmann will be attending.

## 2020-02-06 NOTE — Telephone Encounter (Signed)
ok 

## 2020-02-06 NOTE — Telephone Encounter (Signed)
Sheldon be attending? Dr. Benay Spice made referral on behalf of Dr Julien Nordmann.     Over the weekend, Family member , Hassan Rowan, called for emergency admission . He had  "drastic decline since last Tuesday".  Pt has brief periods of apnea and actively dying.

## 2020-02-20 DEATH — deceased

## 2020-02-27 ENCOUNTER — Telehealth: Payer: Self-pay | Admitting: Primary Care

## 2020-02-27 NOTE — Telephone Encounter (Signed)
I am so sorry to hear that. Is this a family member or nursing calling to let us know. Appears Dr. Julien Nordmann signed hospice order in February

## 2020-02-28 NOTE — Telephone Encounter (Signed)
[name] is the pt's spouse. Would you like to send condolence card?

## 2020-02-28 NOTE — Telephone Encounter (Signed)
Yes please send [place], DO [facility] 02/28/2020 11:06 AM

## 2020-02-29 NOTE — Telephone Encounter (Signed)
Thank you, Please let me sign

## 2020-02-29 NOTE — Telephone Encounter (Signed)
Eustaquio Maize has signed card. Condolence card has been placed in Dr. Juline Patch to do. Once signed I will mail to the family. Will sign off

## 2020-03-07 ENCOUNTER — Ambulatory Visit: Payer: Medicare Other | Admitting: Primary Care

## 2020-03-09 ENCOUNTER — Other Ambulatory Visit: Payer: Medicare Other

## 2020-03-12 ENCOUNTER — Ambulatory Visit: Payer: Medicare Other | Admitting: Internal Medicine

## 2020-05-31 IMAGING — DX PORTABLE CHEST - 1 VIEW
1 series · 1 of 1 positions shown · non-contrast
Comparison: 05/22/2019 and earlier.

CLINICAL DATA: 84-year-old male status post right side VATS with
talc pleurodesis postoperative day 3.

EXAM:
PORTABLE CHEST 1 VIEW

[chest ap]
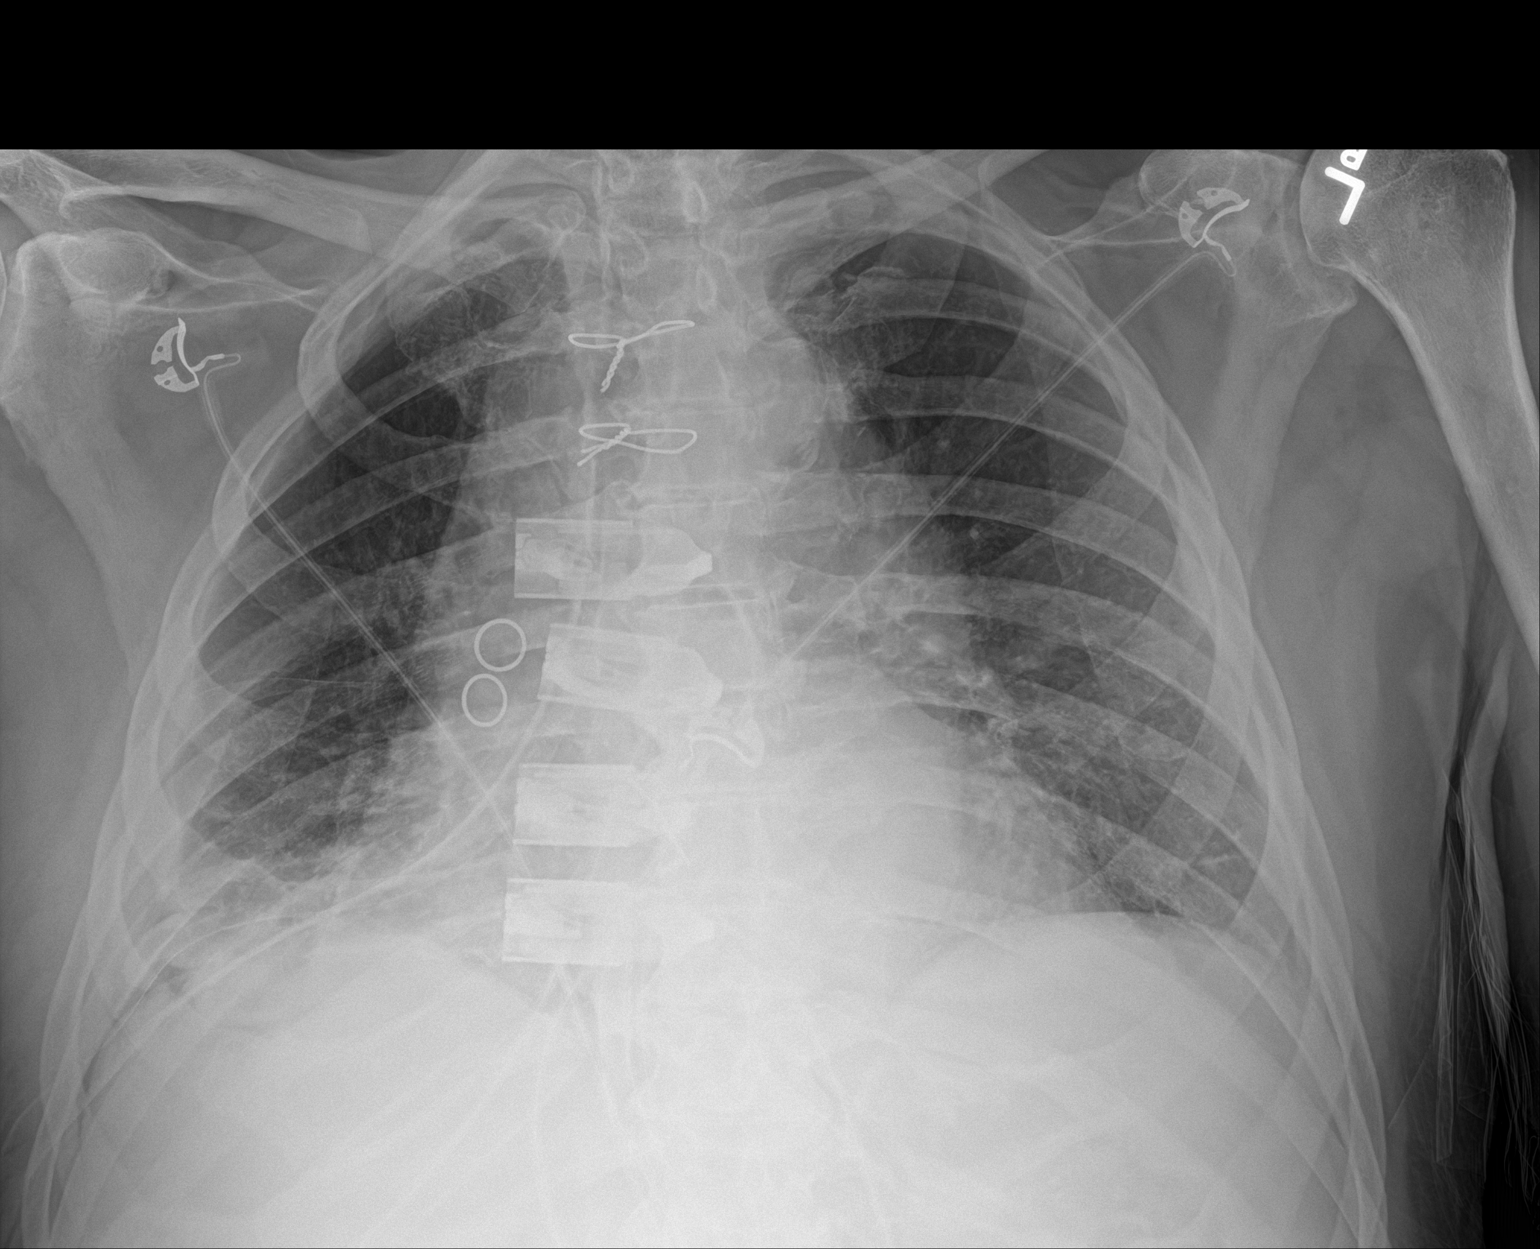

[1 of 1 positions shown; findings below may reference images not displayed]

FINDINGS: Portable AP semi upright view at 6376 hours. Stable right chest
tubes. No pneumothorax identified. Stable lung volumes and
ventilation with patchy right lung base opacity. Stable cardiac size
and mediastinal contours. Prior CABG. Paucity of bowel gas in the
upper abdomen. No acute osseous abnormality identified.
IMPRESSION: Stable chest tubes and postoperative appearance of the right chest.
No pneumothorax .

## 2020-05-31 IMAGING — CT CT ANGIOGRAPHY CHEST
2 of 7 series · 18 of 46 positions shown · IV contrast (APPLIED)
Comparison: CT dated 05/14/2019

CLINICAL DATA: Tachycardia.  Back pain.

EXAM:
CT ANGIOGRAPHY CHEST WITH CONTRAST
TECHNIQUE: Multidetector CT imaging of the chest was performed using the
standard protocol during bolus administration of intravenous
contrast. Multiplanar CT image reconstructions and MIPs were
obtained to evaluate the vascular anatomy.
CONTRAST:  75mL OMNIPAQUE IOHEXOL 350 MG/ML SOLN

[Series 7: thins · axial · 0.80mm/px · z∈[+1204,+1437]mm · 15 of 375 slices shown]
[im 21/375  lung]
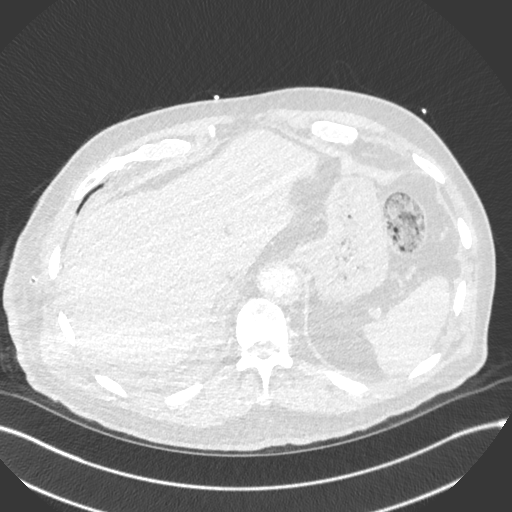
[im 42/375  soft-tissue]
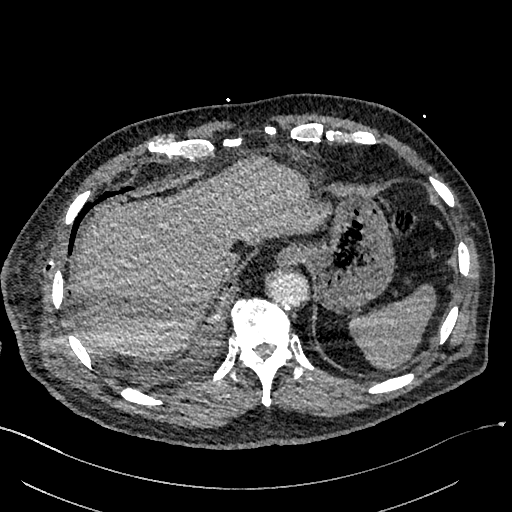
[im 63/375  lung]
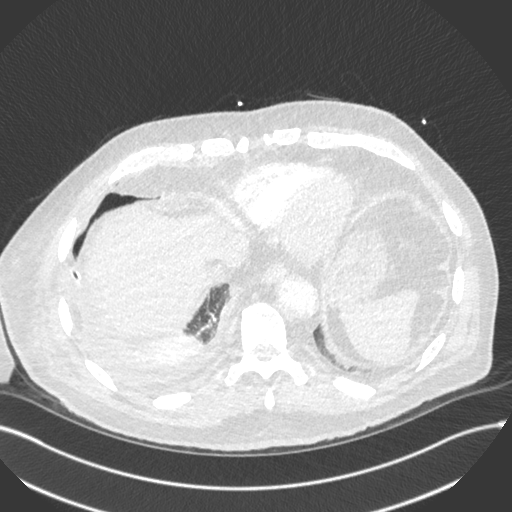
[im 84/375  soft-tissue]
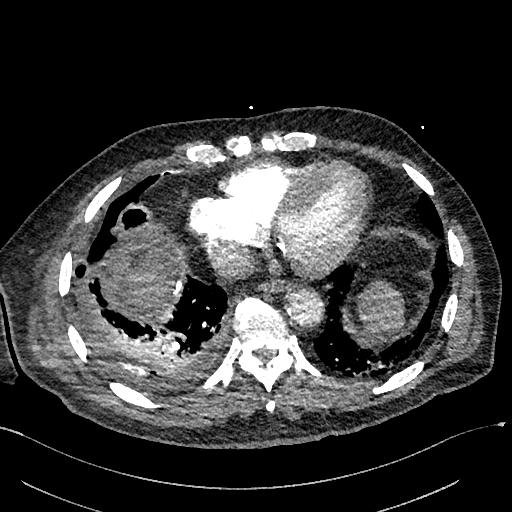
[im 125/375  lung]
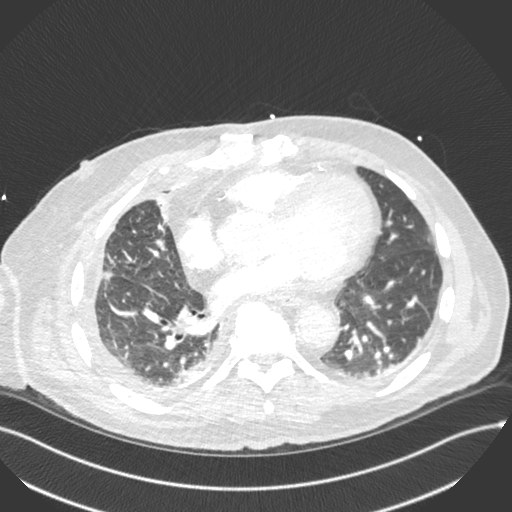
[im 146/375  soft-tissue]
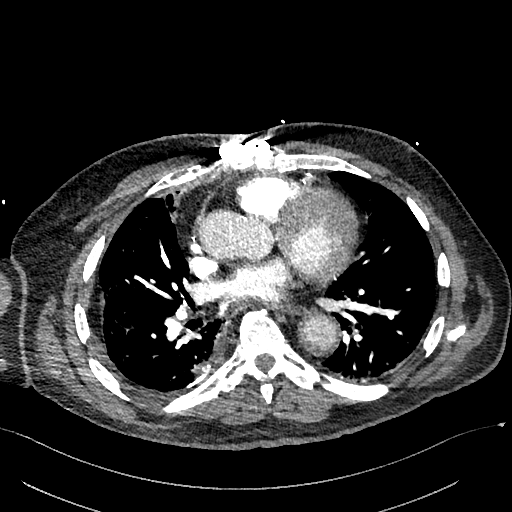
[im 167/375  lung]
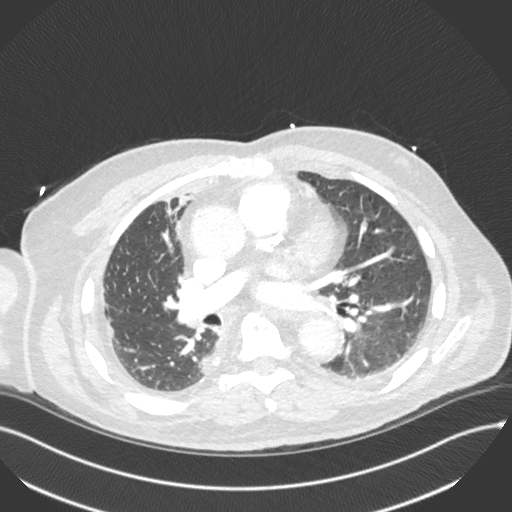
[im 188/375  soft-tissue]
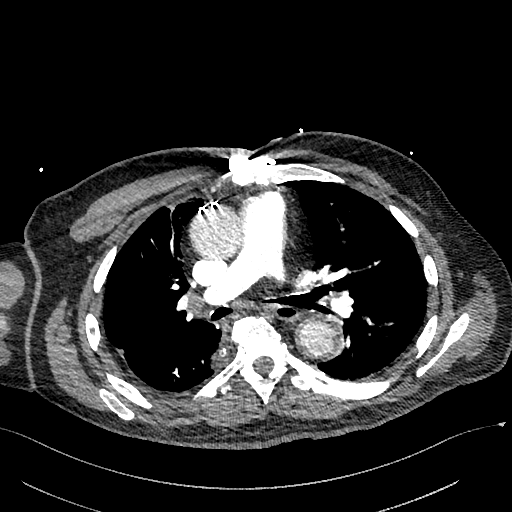
[im 208/375  lung]
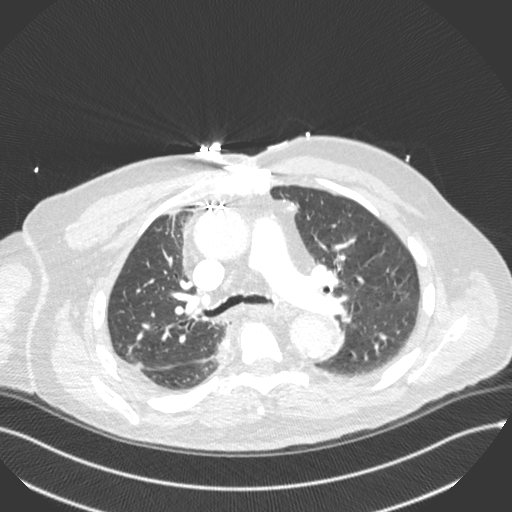
[im 229/375  soft-tissue]
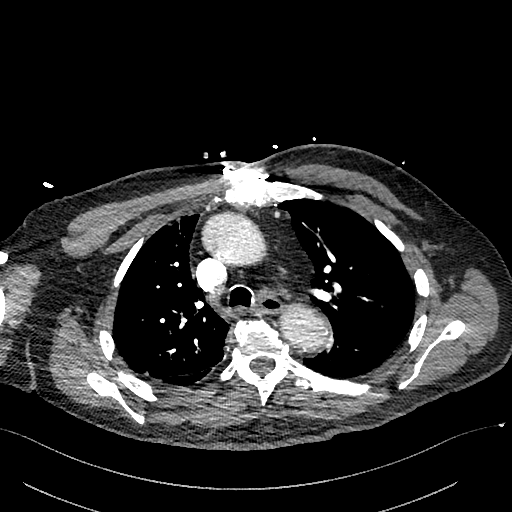
[im 250/375  lung]
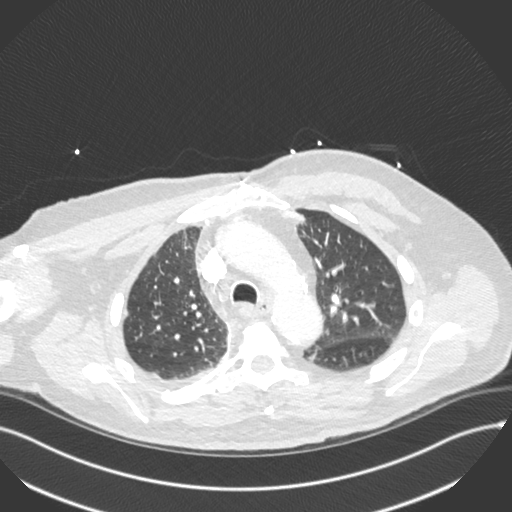
[im 291/375  soft-tissue]
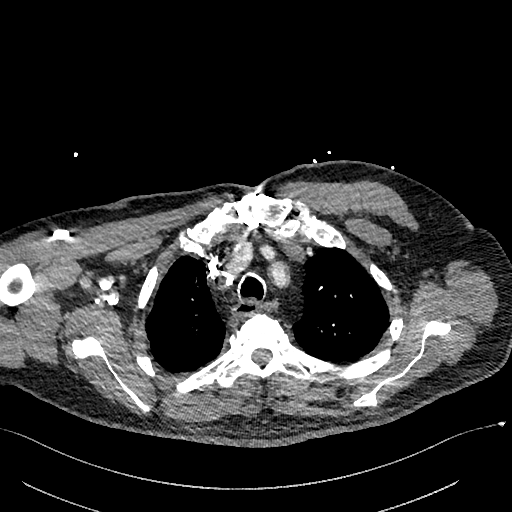
[im 312/375  lung]
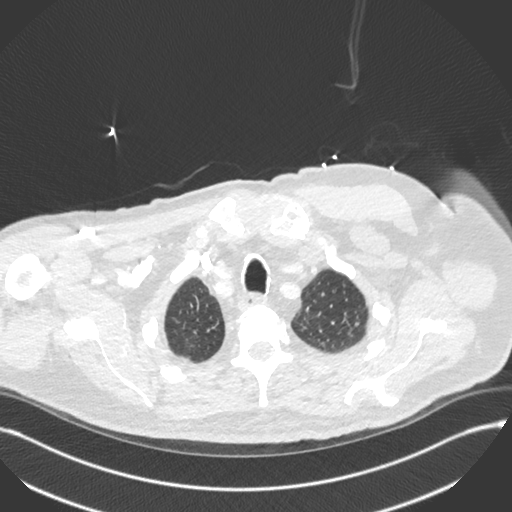
[im 333/375  soft-tissue]
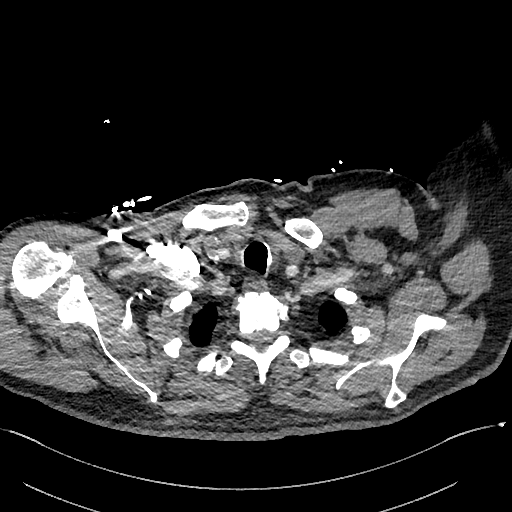
[im 354/375  lung]
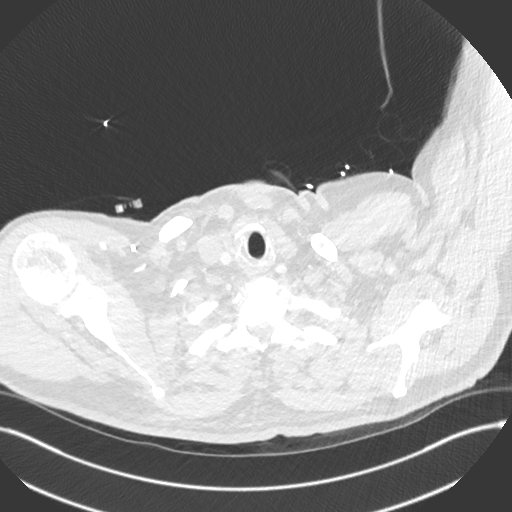

[Series 8: cor · coronal · 0.53mm/px · 3 of 151 slices shown]
[im 38/151  soft-tissue]
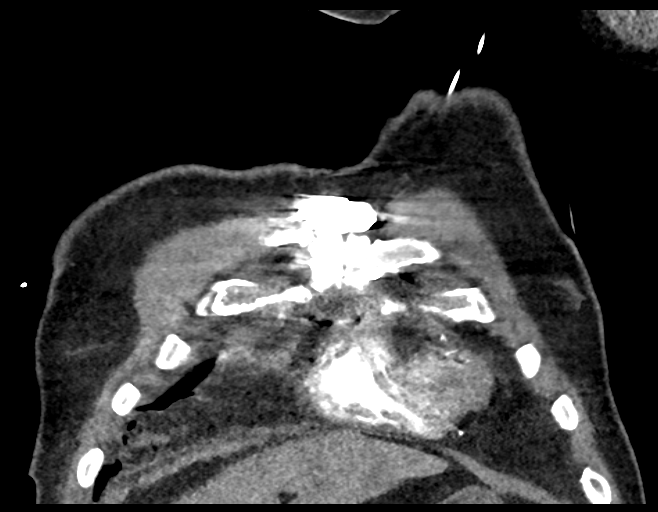
[im 76/151  soft-tissue]
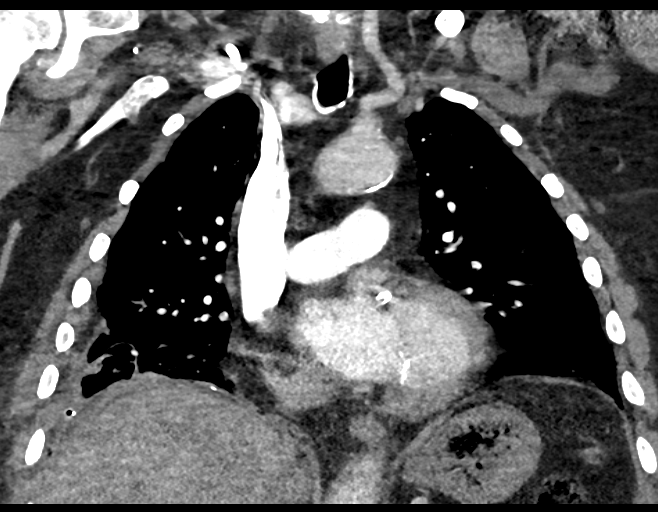
[im 113/151  soft-tissue]
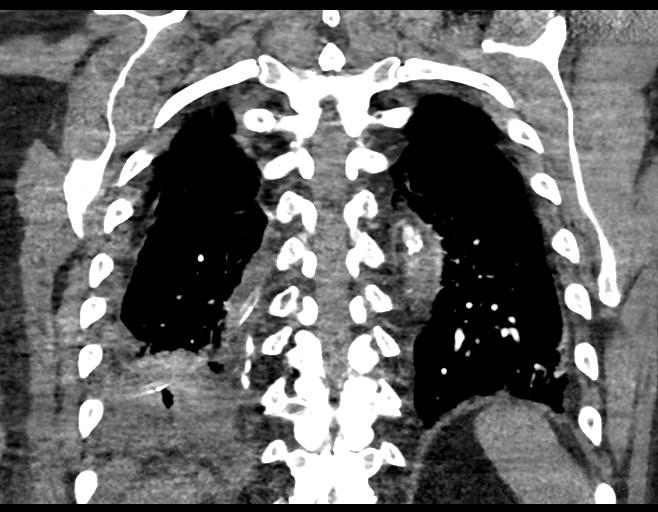

[18 of 46 positions shown; findings below may reference images not displayed]

FINDINGS: Cardiovascular: There is no PE identified. There are coronary artery
calcifications. Atherosclerotic changes are noted of the thoracic
aorta. There is no large pericardial effusion. Aortic calcifications
are noted. The thoracic aorta measures up to 4.2 cm.

Mediastinum/Nodes: No enlarged mediastinal, hilar, or axillary lymph
nodes. Thyroid gland, trachea, and esophagus demonstrate no
significant findings.

Lungs/Pleura: There is been significant interval decrease in size of
the previously noted right-sided pleural effusion. There is a small
residual loculated right-sided hydropneumothorax. There is
atelectasis involving the right lung base. Calcified pleural based
plaques are again noted. There is atelectasis at the left lung base
with a few calcified pleural based plaques. A right-sided chest tube
is in place.

Upper Abdomen: No acute abnormality.

Musculoskeletal: No chest wall abnormality. No acute or significant
osseous findings.

Review of the MIP images confirms the above findings.
IMPRESSION: 1. No PE.
2. Significant interval decrease in size of the previously
demonstrated right-sided pleural effusion. A right-sided chest tube
is in place with a small residual right-sided hydropneumothorax.
3. Again identified are calcified pleural based plaques bilaterally.
4. Additional chronic findings as previously described.

Aortic Atherosclerosis ([id number]).

## 2020-05-31 IMAGING — DX PORTABLE CHEST - 1 VIEW
1 series · 1 of 1 positions shown · non-contrast
Comparison: Radiograph of same day.

CLINICAL DATA: Chest tube removal.

EXAM:
PORTABLE CHEST 1 VIEW

[chest ap]
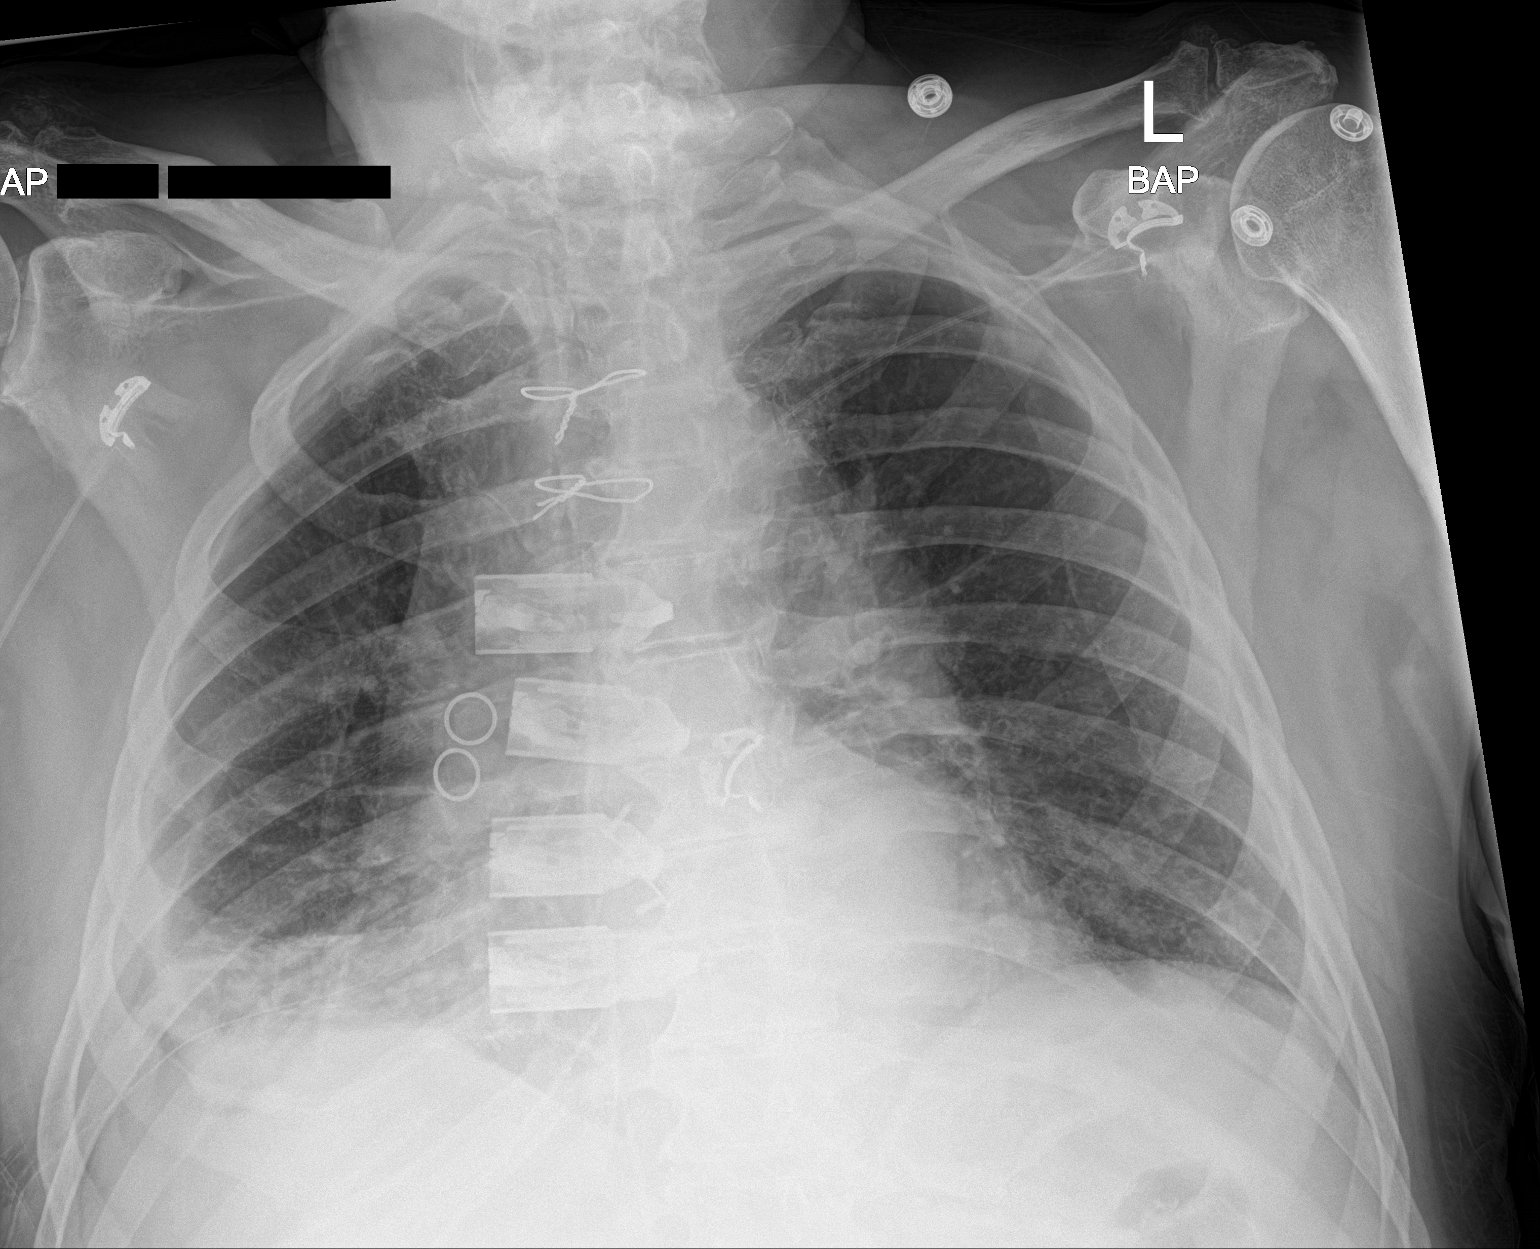

[1 of 1 positions shown; findings below may reference images not displayed]

FINDINGS: Stable cardiomediastinal silhouette. Right-sided chest tube is
unchanged in position. No pneumothorax is noted. Left lung is clear.
Stable right basilar atelectasis is noted with probable minimal
pleural effusion.
IMPRESSION: Stable position of right-sided chest tube is noted. No pneumothorax
is noted. Stable right basilar atelectasis is noted.

## 2020-06-01 IMAGING — DX PORTABLE CHEST - 1 VIEW
1 series · 1 of 1 positions shown · non-contrast
Comparison: 05/23/2019

CLINICAL DATA: Status post chest tube placement

EXAM:
PORTABLE CHEST 1 VIEW

[chest ap]
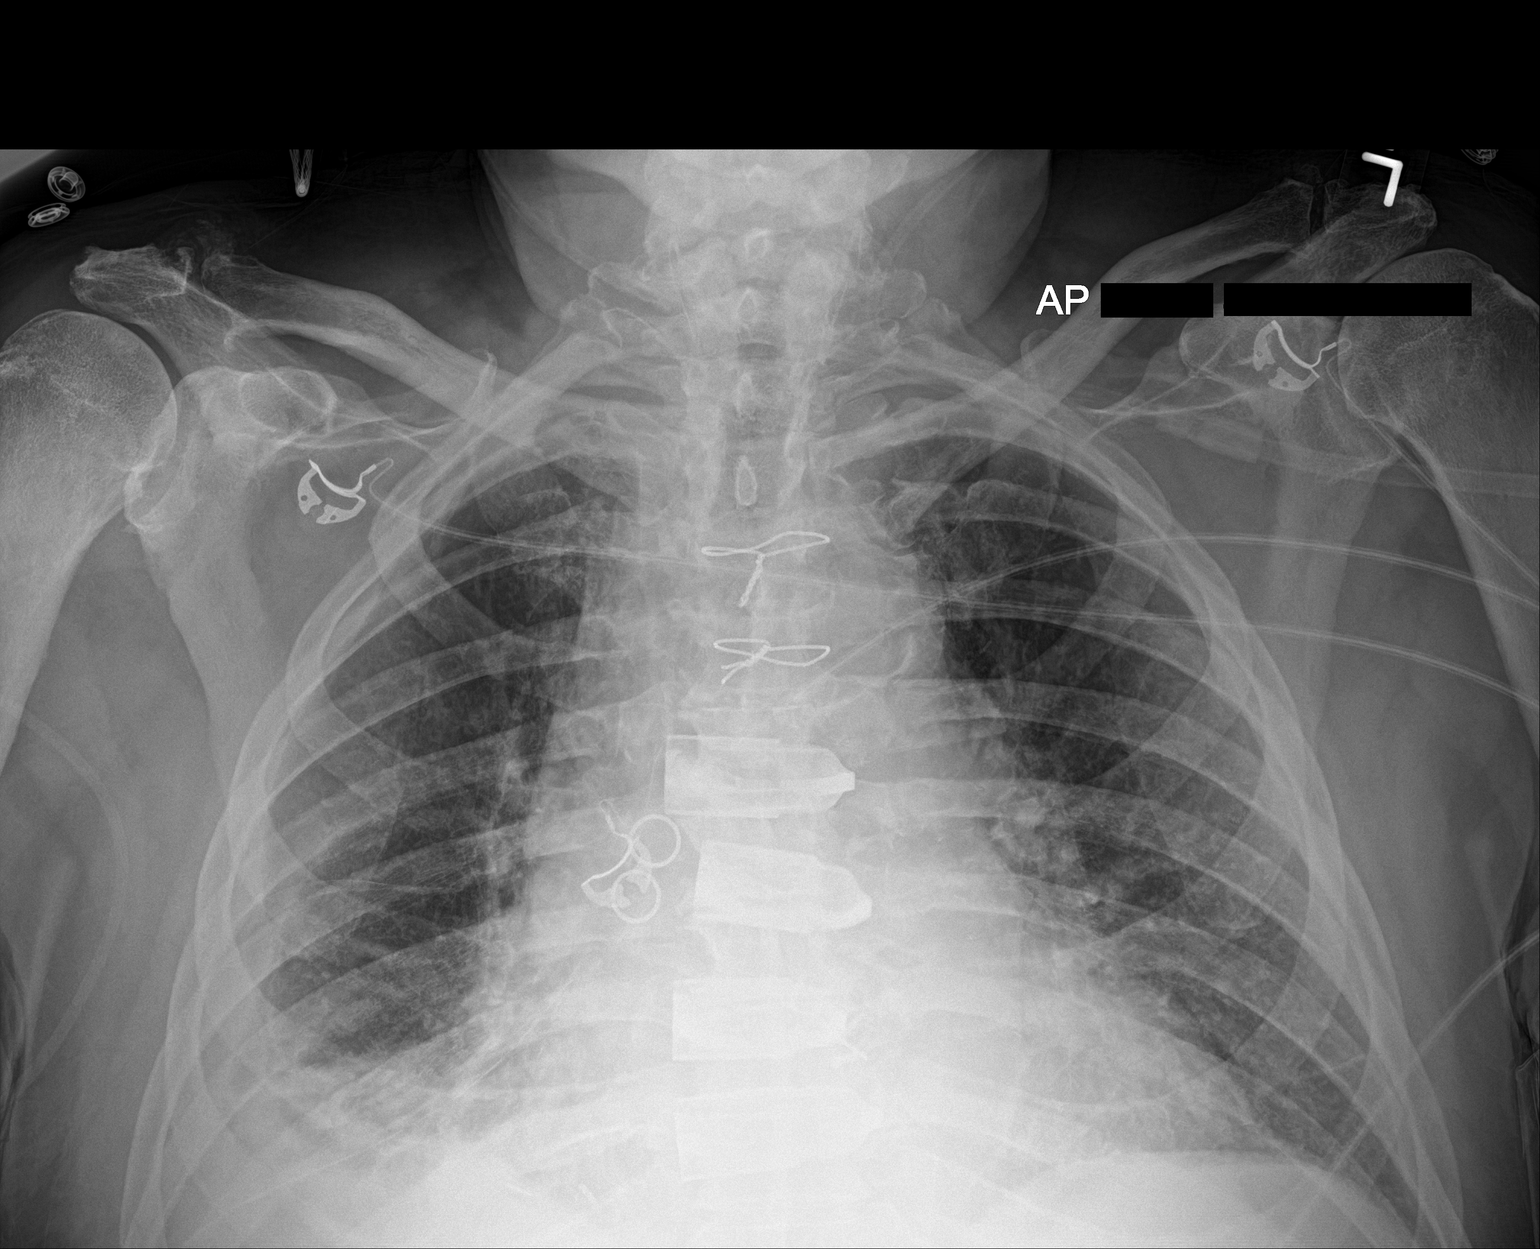

[1 of 1 positions shown; findings below may reference images not displayed]

FINDINGS: Cardiac shadow is enlarged but stable. Postsurgical changes are
noted. Right-sided pleural effusion is again seen with small PleurX
catheter noted in place. No pneumothorax is seen. Left lung is
clear.
IMPRESSION: Right-sided pleural effusion stable from the prior exam.

No new focal abnormality is seen.

## 2023-11-12 NOTE — Telephone Encounter (Signed)
Telephone call
